# Patient Record
Sex: Male | Born: 1955
Health system: Southern US, Community
[De-identification: ages and names within clinical notes are randomized; demographics above are authoritative.]

## PROBLEM LIST (undated history)

## (undated) DIAGNOSIS — I10 Essential (primary) hypertension: Secondary | ICD-10-CM

## (undated) DIAGNOSIS — I213 ST elevation (STEMI) myocardial infarction of unspecified site: Secondary | ICD-10-CM

## (undated) DIAGNOSIS — K449 Diaphragmatic hernia without obstruction or gangrene: Secondary | ICD-10-CM

## (undated) DIAGNOSIS — I2699 Other pulmonary embolism without acute cor pulmonale: Secondary | ICD-10-CM

## (undated) DIAGNOSIS — I639 Cerebral infarction, unspecified: Secondary | ICD-10-CM

## (undated) DIAGNOSIS — R570 Cardiogenic shock: Secondary | ICD-10-CM

## (undated) DIAGNOSIS — J9691 Respiratory failure, unspecified with hypoxia: Secondary | ICD-10-CM

## (undated) DIAGNOSIS — I2510 Atherosclerotic heart disease of native coronary artery without angina pectoris: Secondary | ICD-10-CM

## (undated) DIAGNOSIS — E785 Hyperlipidemia, unspecified: Secondary | ICD-10-CM

## (undated) DIAGNOSIS — Z951 Presence of aortocoronary bypass graft: Secondary | ICD-10-CM

## (undated) DIAGNOSIS — G479 Sleep disorder, unspecified: Secondary | ICD-10-CM

## (undated) DIAGNOSIS — R7303 Prediabetes: Secondary | ICD-10-CM

## (undated) DIAGNOSIS — R7401 Elevation of levels of liver transaminase levels: Secondary | ICD-10-CM

## (undated) DIAGNOSIS — G459 Transient cerebral ischemic attack, unspecified: Secondary | ICD-10-CM

## (undated) HISTORY — DX: Presence of aortocoronary bypass graft: Z95.1

## (undated) HISTORY — PX: CARDIAC CATHETERIZATION: SHX172

## (undated) HISTORY — DX: Hyperlipidemia, unspecified: E78.5

---

## 2011-01-06 ENCOUNTER — Inpatient Hospital Stay (HOSPITAL_COMMUNITY)
Admission: EM | Admit: 2011-01-06 | Discharge: 2011-01-09 | DRG: 014 | Disposition: A | Discharge status: Home / Self Care | Payer: BC Managed Care – PPO | Attending: Internal Medicine | Admitting: Internal Medicine

## 2011-01-06 ENCOUNTER — Emergency Department (HOSPITAL_COMMUNITY): Payer: BC Managed Care – PPO

## 2011-01-06 DIAGNOSIS — R7309 Other abnormal glucose: Secondary | ICD-10-CM | POA: Diagnosis present

## 2011-01-06 DIAGNOSIS — Z7982 Long term (current) use of aspirin: Secondary | ICD-10-CM

## 2011-01-06 DIAGNOSIS — I6350 Cerebral infarction due to unspecified occlusion or stenosis of unspecified cerebral artery: Principal | ICD-10-CM | POA: Diagnosis present

## 2011-01-06 DIAGNOSIS — R259 Unspecified abnormal involuntary movements: Secondary | ICD-10-CM | POA: Diagnosis present

## 2011-01-06 DIAGNOSIS — I10 Essential (primary) hypertension: Secondary | ICD-10-CM | POA: Diagnosis present

## 2011-01-06 DIAGNOSIS — E785 Hyperlipidemia, unspecified: Secondary | ICD-10-CM | POA: Diagnosis present

## 2011-01-06 DIAGNOSIS — D72829 Elevated white blood cell count, unspecified: Secondary | ICD-10-CM | POA: Diagnosis present

## 2011-01-06 LAB — RAPID URINE DRUG SCREEN, HOSP PERFORMED
Amphetamines: NOT DETECTED
Barbiturates: NOT DETECTED
Benzodiazepines: NOT DETECTED
Cocaine: NOT DETECTED
Tetrahydrocannabinol: NOT DETECTED

## 2011-01-06 LAB — TROPONIN I: Troponin I: <0.3 ng/mL (ref <0.30)

## 2011-01-06 LAB — COMPREHENSIVE METABOLIC PANEL
ALT: 25 U/L (ref 0–53)
AST: 23 U/L (ref 0–37)
Albumin: 3.8 g/dL (ref 3.5–5.2)
CO2: 23 mEq/L (ref 19–32)
Calcium: 8.7 mg/dL (ref 8.4–10.5)
Creatinine, Ser: 1.04 mg/dL (ref 0.50–1.35)
GFR calc non Af Amer: >60 mL/min (ref >60)
Sodium: 135 mEq/L (ref 135–145)
Total Protein: 7.7 g/dL (ref 6.0–8.3)

## 2011-01-06 LAB — CBC
Hemoglobin: 16.1 g/dL (ref 13.0–17.0)
MCHC: 36 g/dL (ref 30.0–36.0)
Platelets: 331 10*3/uL (ref 150–400)
RBC: 5.02 MIL/uL (ref 4.22–5.81)

## 2011-01-06 LAB — APTT: aPTT: 31 seconds (ref 24–37)

## 2011-01-06 LAB — URINALYSIS, ROUTINE W REFLEX MICROSCOPIC
Glucose, UA: NEGATIVE mg/dL
Leukocytes, UA: NEGATIVE
Nitrite: NEGATIVE
Protein, ur: 30 mg/dL — AB
pH: 6 (ref 5.0–8.0)

## 2011-01-06 LAB — URINE MICROSCOPIC-ADD ON

## 2011-01-06 LAB — PROTIME-INR: INR: 0.95 (ref 0.00–1.49)

## 2011-01-06 LAB — CK TOTAL AND CKMB (NOT AT ARMC): Relative Index: 1.3 (ref 0.0–2.5)

## 2011-01-07 ENCOUNTER — Inpatient Hospital Stay (HOSPITAL_COMMUNITY): Payer: BC Managed Care – PPO

## 2011-01-07 LAB — BASIC METABOLIC PANEL
CO2: 22 mEq/L (ref 19–32)
Chloride: 104 mEq/L (ref 96–112)
GFR calc Af Amer: >60 mL/min (ref >60)
Potassium: 3.7 mEq/L (ref 3.5–5.1)

## 2011-01-07 LAB — PHOSPHORUS: Phosphorus: 2 mg/dL — ABNORMAL LOW (ref 2.3–4.6)

## 2011-01-07 LAB — GLUCOSE, CAPILLARY

## 2011-01-07 LAB — CBC
HCT: 44.3 % (ref 39.0–52.0)
MCHC: 35.7 g/dL (ref 30.0–36.0)
Platelets: 365 10*3/uL (ref 150–400)
RDW: 12.6 % (ref 11.5–15.5)
WBC: 14.6 10*3/uL — ABNORMAL HIGH (ref 4.0–10.5)

## 2011-01-07 LAB — LIPID PANEL
LDL Cholesterol: UNDETERMINED mg/dL (ref 0–99)
Triglycerides: 957 mg/dL — ABNORMAL HIGH (ref <150)

## 2011-01-07 LAB — COMPREHENSIVE METABOLIC PANEL
ALT: 23 U/L (ref 0–53)
AST: 20 U/L (ref 0–37)
CO2: 20 mEq/L (ref 19–32)
Calcium: 9 mg/dL (ref 8.4–10.5)
Chloride: 105 mEq/L (ref 96–112)
Creatinine, Ser: 0.86 mg/dL (ref 0.50–1.35)
GFR calc Af Amer: >60 mL/min (ref >60)
GFR calc non Af Amer: >60 mL/min (ref >60)
Glucose, Bld: 125 mg/dL — ABNORMAL HIGH (ref 70–99)
Total Bilirubin: 0.5 mg/dL (ref 0.3–1.2)

## 2011-01-07 LAB — HEMOGLOBIN A1C
Hgb A1c MFr Bld: 5.8 % — ABNORMAL HIGH (ref <5.7)
Mean Plasma Glucose: 120 mg/dL — ABNORMAL HIGH (ref <117)

## 2011-01-07 LAB — MAGNESIUM: Magnesium: 2.2 mg/dL (ref 1.5–2.5)

## 2011-01-07 LAB — MRSA PCR SCREENING: MRSA by PCR: NEGATIVE

## 2011-01-07 LAB — CARDIAC PANEL(CRET KIN+CKTOT+MB+TROPI)
CK, MB: 2.2 ng/mL (ref 0.3–4.0)
CK, MB: 3.8 ng/mL (ref 0.3–4.0)
Troponin I: <0.3 ng/mL (ref <0.30)

## 2011-01-07 NOTE — H&P (Signed)
[name], [name] NO.:  [id number]  MEDICAL RECORD NO.:  [id number]  LOCATION:  [facility]                         FACILITY:  [place]  PHYSICIAN:  [name], MDDATE OF BIRTH:  1956-03-21  DATE OF ADMISSION:  01/06/2011 DATE OF DISCHARGE:                             HISTORY & PHYSICAL   PRIMARY CARE PHYSICIAN:  Dr. Axel [name] at [place].  CHIEF COMPLAINT:  Weakness of the left side.  HISTORY OF PRESENT ILLNESS:  A 55 year old male who was diagnosed with hypertension, and was given some antihypertensive by his primary care physician year ago.  The patient stated it is probably Diovan which he took a few days.  After that, he did not feel good, so he stopped it. He said he was feeling weak after taking that medicine but did not have any angioedema or allergic reaction.  He has been feeling weak on the left side today morning at around 8 o'clock which lasted for around 10 minutes.  He thought he had to drag his leg when he walked.  He also felt clumsiness in his hand on the left side, the right was normal.  He did not have any headaches or any visual symptoms, did not have any difficulty speaking or swallowing.  He did not lose consciousness, did not have any chest pain, shortness of breath, nausea, vomiting, abdominal pain, dysuria, discharge, or diarrhea.  The symptoms resolved by itself within 10 minutes and when he went to church today evening at around 8 o'clock again, the symptoms recurred wherein he had walked back to his truck and sat down there and the symptoms resolved by 10 minutes. The same thing happened again, he felt weak on the left side and had clumsiness in his left and had to drag his feet on the left side.  He did not lose complete function of his upper or lower extremities in the left side.  The right upper and lower extremities are completely normal. He did not have any headache, visual symptoms, difficulty speaking, or swallowing.   Eventually, the patient was brought to the ER, had a CT of head which was negative.  Blood pressure was found to be high around 270/147 at this time it is improved with some hydralazine IV.  The patient will be admitted for further workup.  The patient at this time denies any chest pain, shortness of breath, nausea, vomiting, abdominal pain, dysuria, discharge, or diarrhea.  PAST MEDICAL HISTORY:  History of hypertension.  PAST SURGICAL HISTORY:  Appendectomy and has had a left middle finger surgery which at this time is little bit flexed in position.  MEDICATIONS PRIOR TO ADMISSION:  None.  SOCIAL HISTORY:  The patient denies smoking cigarettes, drinking alcohol, or using illegal drugs.  He is married, lives with his wife.  FAMILY HISTORY:  Positive for hypertension and coronary artery disease.  REVIEW OF SYSTEMS:  As per history of present illness, nothing else significant.  PHYSICAL EXAMINATION:  GENERAL:  The patient examined at bedside not in acute distress. VITAL SIGNS:  Blood pressure is presently 190/115, pulse 90 per minute, temperature 97.7, respiration 88, and O2 sat is 100%. HEENT:  Anicteric.  No pallor.  No facial asymmetry.  PLA positive.  No neck rigidity.  No discharge from ears, eyes, nose, or mouth.  Tongue is midline. CHEST:  Bilateral air entry present.  No rhonchi.  No crepitation. HEART:  S1 and S2 heard. ABDOMEN:  Soft and nontender.  Bowel sounds heard. CNS:  The patient is alert, awake, oriented to time place and person. Moves upper and lower extremities 5/5.  There is no pronator drift. There is no dysdiadochokinesia.  When I made the patient to walk, he feels a little bit dizzy.  He feels as if his leg is pulling. EXTREMITIES:  Peripheral pulses felt.  No edema.  LABORATORY DATA:  EKG normal sinus rhythm with heart rate around 86 beats per minute with nonspecific ST-T changes.  CT of the head without contrast shows no acute intracranial  abnormality, atrophy, chronic microvascular disease.  Chest x-ray shows no evidence of active pulmonary disease.  CBC:  WBC 11, hemoglobin is 16.1, hematocrit is 44.7, and platelets 331.  PT/INR is 12.9 and 0.9.  Complete metabolic panel sodium [id number], potassium 3.7, chloride 100, carbon dioxide 23, glucose 107, BUN 19, creatinine 1, total bilirubin is 0.4, alkaline phosphatase 75, AST 23, ALT 25, total protein 10.7, albumin 3.8, calcium 8.7, creatine kinase is 202, CK-MB is 2.6, relative index 1.3, troponin less than 0.3.  UA is negative for nitrites and leukocytes, protein positive, glucose negative, ketones negative, urine drug screen negative.  ASSESSMENT: 1. Possible cerebrovascular accident  versus transient ischemic     attack. 2. Accelerated hypertension.  PLAN: 1. At this time, admit the patient to telemetry. 2. For his possible CVA versus TIA, at this time we will place the     patient on neuro checks, get MRI of the brain, MRA of the brain,     carotid Doppler, and 2-D echo.  The patient be placed on aspirin     once he passes swallow.  I have also consulted Neurology, Dr.     Wallie [name], who is going to see the patient.  We will follow     their recommendations. 3. Accelerated hypertension.  At this time, we will start the patient     on Norvasc 5 mg p.o. daily and p.r.n. labetalol 10 mg q.4 hourly     for systolic blood pressure more than 200/110.  Eventually, in next     24 hours, if the blood pressure is still very high, we will need to     add other antihypertensives. 4. We will check fasting lipid panel, hemoglobin A1c, and further     recommendation based on test order and clinic course and consults     recommendations.     [name] Patience, MD     ANK/MEDQ  D:  01/06/2011  T:  01/07/2011  Job:  [id number]  Electronically Signed by [name] Birchwood MD on 01/07/2011 07:49:41 AM

## 2011-01-08 LAB — BASIC METABOLIC PANEL
CO2: 26 mEq/L (ref 19–32)
Glucose, Bld: 103 mg/dL — ABNORMAL HIGH (ref 70–99)
Potassium: 3.7 mEq/L (ref 3.5–5.1)
Sodium: 139 mEq/L (ref 135–145)

## 2011-01-08 LAB — CBC
Hemoglobin: 16.2 g/dL (ref 13.0–17.0)
MCH: 31.8 pg (ref 26.0–34.0)
RBC: 5.09 MIL/uL (ref 4.22–5.81)

## 2011-01-09 LAB — BASIC METABOLIC PANEL
BUN: 19 mg/dL (ref 6–23)
Chloride: 101 mEq/L (ref 96–112)
Glucose, Bld: 92 mg/dL (ref 70–99)
Potassium: 3.7 mEq/L (ref 3.5–5.1)

## 2011-01-09 LAB — CBC
HCT: 44.5 % (ref 39.0–52.0)
Hemoglobin: 15.3 g/dL (ref 13.0–17.0)
MCH: 31.4 pg (ref 26.0–34.0)
MCHC: 34.4 g/dL (ref 30.0–36.0)

## 2011-01-13 LAB — CULTURE, BLOOD (ROUTINE X 2)
Culture  Setup Time: 201206251427
Culture: NO GROWTH

## 2011-01-27 NOTE — Discharge Summary (Signed)
NAMEJACOREY, SPACE              ACCOUNT NO.:  [id number]  MEDICAL RECORD NO.:  [id number]  LOCATION:                                 FACILITY:  PHYSICIAN:  Edythe Lynn, M.D.       DATE OF BIRTH:  [birth year]  DATE OF ADMISSION:  01/06/2011 DATE OF DISCHARGE:  01/09/2011                              DISCHARGE SUMMARY   PRIMARY CARE PHYSICIAN:  Tamsen Roers, MD, - in [place]: 1. Acute small focus of infarction in the posterior limb of the     internal capsule on the right - felt to be small vessel disease. 2. Hypertension. 3. Hyperlipidemia with a total cholesterol level of 313 and     triglyceride level of 957. 4. Impaired glucose tolerance.  DISCHARGE MEDICATIONS: 1. Amlodipine 10 mg by mouth daily. 2. Gemfibrozil 600 mg by mouth twice a day. 3. Aspirin 325 mg daily. 4. Triamterene/hydrochlorothiazide 50/25 one tablet daily. 5. A study medication from the [facility].  CONDITION ON DISCHARGE:  [name] was discharge in good condition, alert, oriented, and neurologically intact.  He will follow up with Dr. Chase Picket in [facility] for cholesterol checks, blood pressure checks, medication adjustments.  He will also follow up with [facility] for adjustment of his study medication.  HISTORY AND PHYSICAL:  Refer the dictated H and P which was done by Dr. Hal Hope .  CONSULTATION THIS ADMISSION:  The patient was seen in consultation by the [facility] and Dr. Wallie Char from [facility].  PROCEDURES DURING THIS ADMISSION: 1. On January 08, 2011, the patient underwent an MRI of the brain which     showed an acute small focus of infarction in the posterior limb of     the internal capsule on the right.  The patient had MRA of the head     which was mild atherosclerotic disease but no significant stenosis. 2. On January 07, 2011, echocardiogram, findings of ejection fraction  of     55-60%, no regional wall motion abnormalities, essentially normal     study. 3. Carotid ultrasound on January 07, 2011, showing no significant     extracranial carotid artery stenosis demonstrated. 4. Transcranial ultrasound which shows low normal mean flow velocities     in the majority of the vessels.  No significant intracranial     stenosis or shunt seen.  HOSPITAL COURSE:  [name] is a 55 year old gentleman with history of hypertension presented to emergency room with severe headache, blurred vision, blood pressure readings of 200/110.  He was also having some left arm weakness and left leg weakness which resolved in course of 10 minutes.  [name] was admitted to a step-down unit where he was started on labetalol intravenously and then he was transitioned to amlodipine and hydrochlorothiazide.  His blood pressure readings improved.  By the time of discharge, he was into the 160s over 90s.  He was closely monitored and he had no neurological deficits by the time of discharge.  He was seen twice by the consultant neurologist, Dr. Leonie Man, who recommended that he take aspirin and a study medication to prevent recurrent strokes.  The patient was instructed about low-salt, heart- healthy diet.  He was started on three antihypertensives with triamterene and hydrochlorothiazide and amlodipine and he had a fasting lipid panel checked which revealed that he has mixed hyperlipidemia with a high total cholesterol, high triglycerides, low HDL.  He was advised on a low-fat diet, exercise, and was started on gemfibrozil as his triglycerides were very elevated.  The patient will follow up with his primary care physician for blood pressure checks, fasting lipid panel checks, and with [facility] for antiplatelet therapy checkups from the research study he is enrolled in.     Edythe Lynn, M.D.     SL/MEDQ  D:  01/11/2011  T:  01/11/2011  Job:  [id number]  cc:    Jeanella Craze. Little, M.D. Pramod P. Leonie Man, MD  Electronically Signed by Edythe Lynn M.D. on 01/27/2011 07:39:15 AM

## 2012-08-31 ENCOUNTER — Ambulatory Visit (HOSPITAL_COMMUNITY): Admit: 2012-08-31 | Payer: Self-pay | Admitting: Cardiology

## 2012-08-31 ENCOUNTER — Encounter (HOSPITAL_COMMUNITY): Payer: Self-pay | Admitting: Emergency Medicine

## 2012-08-31 ENCOUNTER — Inpatient Hospital Stay (HOSPITAL_COMMUNITY)
Admission: EM | Admit: 2012-08-31 | Discharge: 2012-09-05 | DRG: 106 | Disposition: A | Discharge status: Home / Self Care | Payer: BC Managed Care – PPO | Attending: Surgery | Admitting: Surgery

## 2012-08-31 ENCOUNTER — Encounter (HOSPITAL_COMMUNITY)
Admission: EM | Disposition: A | Discharge status: Home / Self Care | Payer: Self-pay | Source: Home / Self Care | Attending: Surgery

## 2012-08-31 ENCOUNTER — Emergency Department (HOSPITAL_COMMUNITY): Payer: BC Managed Care – PPO

## 2012-08-31 DIAGNOSIS — I219 Acute myocardial infarction, unspecified: Secondary | ICD-10-CM

## 2012-08-31 DIAGNOSIS — I2510 Atherosclerotic heart disease of native coronary artery without angina pectoris: Secondary | ICD-10-CM | POA: Diagnosis present

## 2012-08-31 DIAGNOSIS — R112 Nausea with vomiting, unspecified: Secondary | ICD-10-CM | POA: Diagnosis not present

## 2012-08-31 DIAGNOSIS — K449 Diaphragmatic hernia without obstruction or gangrene: Secondary | ICD-10-CM | POA: Diagnosis present

## 2012-08-31 DIAGNOSIS — Z79899 Other long term (current) drug therapy: Secondary | ICD-10-CM

## 2012-08-31 DIAGNOSIS — D62 Acute posthemorrhagic anemia: Secondary | ICD-10-CM | POA: Diagnosis not present

## 2012-08-31 DIAGNOSIS — Z23 Encounter for immunization: Secondary | ICD-10-CM

## 2012-08-31 DIAGNOSIS — I2129 ST elevation (STEMI) myocardial infarction involving other sites: Principal | ICD-10-CM | POA: Diagnosis present

## 2012-08-31 DIAGNOSIS — R7309 Other abnormal glucose: Secondary | ICD-10-CM | POA: Diagnosis present

## 2012-08-31 DIAGNOSIS — I472 Ventricular tachycardia, unspecified: Secondary | ICD-10-CM | POA: Diagnosis not present

## 2012-08-31 DIAGNOSIS — E8779 Other fluid overload: Secondary | ICD-10-CM | POA: Diagnosis not present

## 2012-08-31 DIAGNOSIS — Z951 Presence of aortocoronary bypass graft: Secondary | ICD-10-CM

## 2012-08-31 DIAGNOSIS — I213 ST elevation (STEMI) myocardial infarction of unspecified site: Secondary | ICD-10-CM

## 2012-08-31 DIAGNOSIS — R7303 Prediabetes: Secondary | ICD-10-CM | POA: Diagnosis present

## 2012-08-31 DIAGNOSIS — E782 Mixed hyperlipidemia: Secondary | ICD-10-CM | POA: Diagnosis present

## 2012-08-31 DIAGNOSIS — I4891 Unspecified atrial fibrillation: Secondary | ICD-10-CM | POA: Diagnosis present

## 2012-08-31 DIAGNOSIS — I4729 Other ventricular tachycardia: Secondary | ICD-10-CM | POA: Diagnosis not present

## 2012-08-31 DIAGNOSIS — Z8673 Personal history of transient ischemic attack (TIA), and cerebral infarction without residual deficits: Secondary | ICD-10-CM

## 2012-08-31 DIAGNOSIS — Z8249 Family history of ischemic heart disease and other diseases of the circulatory system: Secondary | ICD-10-CM

## 2012-08-31 DIAGNOSIS — I10 Essential (primary) hypertension: Secondary | ICD-10-CM | POA: Diagnosis present

## 2012-08-31 DIAGNOSIS — R079 Chest pain, unspecified: Secondary | ICD-10-CM

## 2012-08-31 HISTORY — DX: Cerebral infarction, unspecified: I63.9

## 2012-08-31 HISTORY — DX: ST elevation (STEMI) myocardial infarction of unspecified site: I21.3

## 2012-08-31 HISTORY — DX: Transient cerebral ischemic attack, unspecified: G45.9

## 2012-08-31 HISTORY — PX: PERCUTANEOUS CORONARY STENT INTERVENTION (PCI-S): SHX5485

## 2012-08-31 HISTORY — DX: Diaphragmatic hernia without obstruction or gangrene: K44.9

## 2012-08-31 HISTORY — DX: Essential (primary) hypertension: I10

## 2012-08-31 HISTORY — DX: Prediabetes: R73.03

## 2012-08-31 HISTORY — PX: LEFT HEART CATHETERIZATION WITH CORONARY ANGIOGRAM: SHX5451

## 2012-08-31 LAB — COMPREHENSIVE METABOLIC PANEL
Alkaline Phosphatase: 65 U/L (ref 39–117)
BUN: 25 mg/dL — ABNORMAL HIGH (ref 6–23)
Chloride: 103 mEq/L (ref 96–112)
GFR calc Af Amer: 63 mL/min — ABNORMAL LOW (ref >90)
Glucose, Bld: 129 mg/dL — ABNORMAL HIGH (ref 70–99)
Potassium: 3.6 mEq/L (ref 3.5–5.1)
Total Bilirubin: 0.3 mg/dL (ref 0.3–1.2)

## 2012-08-31 LAB — CBC
HCT: 41.6 % (ref 39.0–52.0)
Hemoglobin: 14.6 g/dL (ref 13.0–17.0)
MCH: 29.8 pg (ref 26.0–34.0)
MCHC: 35.1 g/dL (ref 30.0–36.0)
MCV: 87.9 fL (ref 78.0–100.0)
Platelets: 408 10*3/uL — ABNORMAL HIGH (ref 150–400)
RBC: 4.63 MIL/uL (ref 4.22–5.81)
RDW: 12.2 % (ref 11.5–15.5)

## 2012-08-31 LAB — PROTIME-INR: Prothrombin Time: 13 seconds (ref 11.6–15.2)

## 2012-08-31 LAB — APTT: aPTT: 28 seconds (ref 24–37)

## 2012-08-31 LAB — POCT I-STAT, CHEM 8
BUN: 23 mg/dL (ref 6–23)
BUN: 24 mg/dL — ABNORMAL HIGH (ref 6–23)
Chloride: 106 mEq/L (ref 96–112)
Creatinine, Ser: 1.5 mg/dL — ABNORMAL HIGH (ref 0.50–1.35)
Creatinine, Ser: 1.4 mg/dL — ABNORMAL HIGH (ref 0.50–1.35)
Potassium: 3.6 mEq/L (ref 3.5–5.1)
Sodium: 141 mEq/L (ref 135–145)
Sodium: 141 mEq/L (ref 135–145)
TCO2: 24 mmol/L (ref 0–100)
TCO2: 25 mmol/L (ref 0–100)

## 2012-08-31 LAB — POCT I-STAT TROPONIN I

## 2012-08-31 SURGERY — LEFT HEART CATHETERIZATION WITH CORONARY ANGIOGRAM
Anesthesia: LOCAL

## 2012-08-31 MED ORDER — PANTOPRAZOLE SODIUM 40 MG PO TBEC: 40.0000 mg | DELAYED_RELEASE_TABLET | Freq: Every day | ORAL | Status: DC

## 2012-08-31 MED ORDER — ONDANSETRON HCL 4 MG/2ML IJ SOLN
INTRAMUSCULAR | Status: AC
  Filled 2012-08-31: qty 2

## 2012-08-31 MED ORDER — ONDANSETRON HCL 4 MG/2ML IJ SOLN
4.0000 mg | Freq: Once | INTRAMUSCULAR | Status: AC
  Administered 2012-08-31: 4 mg via INTRAVENOUS
  Filled 2012-08-31: qty 2

## 2012-08-31 MED ORDER — SODIUM CHLORIDE 0.9 % IV SOLN
1000.0000 mL | INTRAVENOUS | Status: DC
  Administered 2012-08-31: 1000 mL via INTRAVENOUS

## 2012-08-31 MED ORDER — EPTIFIBATIDE 75 MG/100ML IV SOLN
2.0000 ug/kg/min | INTRAVENOUS | Status: DC
  Administered 2012-09-01: 2 ug/kg/min via INTRAVENOUS
  Filled 2012-08-31: qty 100

## 2012-08-31 MED ORDER — LIDOCAINE HCL (PF) 1 % IJ SOLN
INTRAMUSCULAR | Status: AC
  Filled 2012-08-31: qty 30

## 2012-08-31 MED ORDER — NITROGLYCERIN 1 MG/10 ML FOR IR/CATH LAB
INTRA_ARTERIAL | Status: AC
  Filled 2012-08-31: qty 10

## 2012-08-31 MED ORDER — SODIUM CHLORIDE 0.9 % IJ SOLN
3.0000 mL | Freq: Two times a day (BID) | INTRAMUSCULAR | Status: DC
  Administered 2012-09-01: 3 mL via INTRAVENOUS

## 2012-08-31 MED ORDER — SODIUM CHLORIDE 0.9 % IJ SOLN: 3.0000 mL | INTRAMUSCULAR | Status: DC | PRN

## 2012-08-31 MED ORDER — NITROGLYCERIN IN D5W 200-5 MCG/ML-% IV SOLN: 5.0000 ug/min | INTRAVENOUS | Status: DC

## 2012-08-31 MED ORDER — ACETAMINOPHEN 325 MG PO TABS: 650.0000 mg | ORAL_TABLET | ORAL | Status: DC | PRN

## 2012-08-31 MED ORDER — SODIUM CHLORIDE 0.9 % IV SOLN: 250.0000 mL | INTRAVENOUS | Status: DC | PRN

## 2012-08-31 MED ORDER — METOPROLOL TARTRATE 1 MG/ML IV SOLN
5.0000 mg | Freq: Once | INTRAVENOUS | Status: AC
  Administered 2012-08-31 (×2): 5 mg via INTRAVENOUS

## 2012-08-31 MED ORDER — SODIUM CHLORIDE 0.9 % IV SOLN
1.7500 mg/kg/h | INTRAVENOUS | Status: DC
  Filled 2012-08-31: qty 250

## 2012-08-31 MED ORDER — ONDANSETRON HCL 4 MG/2ML IJ SOLN
4.0000 mg | Freq: Four times a day (QID) | INTRAMUSCULAR | Status: DC | PRN
  Administered 2012-09-01: 4 mg via INTRAVENOUS
  Filled 2012-08-31: qty 2

## 2012-08-31 MED ORDER — ONDANSETRON HCL 4 MG/2ML IJ SOLN
4.0000 mg | Freq: Once | INTRAMUSCULAR | Status: AC
  Administered 2012-08-31: 4 mg via INTRAVENOUS

## 2012-08-31 MED ORDER — HYDROMORPHONE HCL PF 1 MG/ML IJ SOLN
1.0000 mg | INTRAMUSCULAR | Status: DC | PRN
  Administered 2012-08-31 (×2): 1 mg via INTRAVENOUS
  Filled 2012-08-31 (×2): qty 1

## 2012-08-31 MED ORDER — BIVALIRUDIN 250 MG IV SOLR
INTRAVENOUS | Status: AC
  Filled 2012-08-31: qty 250

## 2012-08-31 MED ORDER — FUROSEMIDE 10 MG/ML IJ SOLN
40.0000 mg | Freq: Once | INTRAMUSCULAR | Status: AC
  Administered 2012-08-31: 40 mg via INTRAVENOUS
  Filled 2012-08-31: qty 4

## 2012-08-31 MED ORDER — METOPROLOL TARTRATE 1 MG/ML IV SOLN
INTRAVENOUS | Status: AC
  Administered 2012-08-31: 5 mg via INTRAVENOUS
  Filled 2012-08-31: qty 5

## 2012-08-31 MED ORDER — EPTIFIBATIDE 75 MG/100ML IV SOLN
INTRAVENOUS | Status: AC
  Administered 2012-09-01: 2 ug/kg/min via INTRAVENOUS
  Filled 2012-08-31: qty 100

## 2012-08-31 MED ORDER — HYDROMORPHONE HCL PF 2 MG/ML IJ SOLN
2.0000 mg | Freq: Once | INTRAMUSCULAR | Status: AC
  Administered 2012-08-31: 2 mg via INTRAVENOUS

## 2012-08-31 MED ORDER — NITROGLYCERIN 0.4 MG SL SUBL
0.4000 mg | SUBLINGUAL_TABLET | SUBLINGUAL | Status: DC | PRN
  Administered 2012-08-31: 0.4 mg via SUBLINGUAL

## 2012-08-31 MED ORDER — NITROGLYCERIN 0.4 MG SL SUBL
SUBLINGUAL_TABLET | SUBLINGUAL | Status: AC
  Filled 2012-08-31: qty 25

## 2012-08-31 MED ORDER — HEPARIN SODIUM (PORCINE) 5000 UNIT/ML IJ SOLN
INTRAMUSCULAR | Status: AC
  Filled 2012-08-31: qty 1

## 2012-08-31 MED ORDER — SODIUM CHLORIDE 0.9 % IV SOLN: INTRAVENOUS | Status: DC

## 2012-08-31 MED ORDER — PANTOPRAZOLE SODIUM 40 MG IV SOLR
40.0000 mg | Freq: Once | INTRAVENOUS | Status: DC
  Filled 2012-08-31: qty 40

## 2012-08-31 MED ORDER — HEPARIN (PORCINE) IN NACL 2-0.9 UNIT/ML-% IJ SOLN
INTRAMUSCULAR | Status: AC
  Filled 2012-08-31: qty 1000

## 2012-08-31 MED ORDER — HYDROMORPHONE HCL PF 2 MG/ML IJ SOLN
INTRAMUSCULAR | Status: AC
  Filled 2012-08-31: qty 1

## 2012-08-31 MED ORDER — VERAPAMIL HCL 2.5 MG/ML IV SOLN
INTRAVENOUS | Status: AC
  Filled 2012-08-31: qty 2

## 2012-08-31 MED ORDER — HEPARIN (PORCINE) IN NACL 100-0.45 UNIT/ML-% IJ SOLN
1100.0000 [IU]/h | INTRAMUSCULAR | Status: DC
  Administered 2012-09-01: 1100 [IU]/h via INTRAVENOUS
  Filled 2012-08-31: qty 250

## 2012-08-31 MED ORDER — NITROGLYCERIN IN D5W 200-5 MCG/ML-% IV SOLN
INTRAVENOUS | Status: AC
  Filled 2012-08-31: qty 250

## 2012-08-31 MED ORDER — METOPROLOL TARTRATE 1 MG/ML IV SOLN
INTRAVENOUS | Status: AC
  Filled 2012-08-31: qty 5

## 2012-08-31 NOTE — ED Provider Notes (Signed)
History    CSN: [id number] Arrival date & time 08/31/12  [id number] First MD Initiated Contact with Patient 08/31/12 1918     Chief Complaint  Patient presents with  . Chest Pain   Patient is a 57 y.o. male presenting with chest pain.  Chest Pain  Patient presents to the emergency room with complaints of acute chest pain. Patient has been having trouble with his thoracic back region and had not been working for the last 3 weeks. He started lifting a mattress today when he started complaining of acute severe 10 of 10 sharp pain in his chest radiating to his back. EMS administered nitroglycerin as well as aspirin without relief. Patient states the pain increases with movement and also is better when his wife has her fist in his back.  He cannot get comfortable. He denies numbness weakness or shortness of breath. He denies any nausea vomiting.  No history of coronary artery disease. No history of aortic disease. Past Medical History  Diagnosis Date  . Hypertension   . Borderline diabetic   . Mini stroke     2  . Hiatal hernia     History reviewed. No pertinent past surgical history.  No family history on file.  History  Substance Use Topics  . Smoking status: Never Smoker   . Smokeless tobacco: Not on file  . Alcohol Use: No      Review of Systems  Cardiovascular: Positive for chest pain.  All other systems reviewed and are negative.    Allergies  Review of patient's allergies indicates no known allergies.  Home Medications   Current Outpatient Rx  Name  Route  Sig  Dispense  Refill  . amLODipine (NORVASC) 10 MG tablet   Oral   Take 10 mg by mouth daily.         Marland Kitchen gemfibrozil (LOPID) 600 MG tablet   Oral   Take 600 mg by mouth 2 (two) times daily before a meal.           BP 135/88  Temp(Src) 97.9 F (36.6 C) (Oral)  Resp 14  SpO2 100%  Physical Exam  Nursing note and vitals reviewed. Constitutional: He appears distressed.  HENT:  Head: Normocephalic and  atraumatic.  Right Ear: External ear normal.  Left Ear: External ear normal.  Eyes: Conjunctivae are normal. Right eye exhibits no discharge. Left eye exhibits no discharge. No scleral icterus.  Neck: Neck supple. No tracheal deviation present.  Cardiovascular: Normal rate, regular rhythm and intact distal pulses.   Pulmonary/Chest: Effort normal and breath sounds normal. No stridor. No respiratory distress. He has no wheezes. He has no rales.  Abdominal: Soft. Bowel sounds are normal. He exhibits no distension. There is no tenderness. There is no rebound and no guarding.  Musculoskeletal: He exhibits no edema and no tenderness.       Thoracic back: He exhibits decreased range of motion and tenderness. He exhibits no bony tenderness and no swelling.       Back:  Neurological: He is alert. He has normal strength. No sensory deficit. Cranial nerve deficit:  no gross defecits noted. He exhibits normal muscle tone. He displays no seizure activity. Coordination normal.  Skin: Skin is warm and dry. No rash noted. He is not diaphoretic.  Psychiatric: He has a normal mood and affect.    ED Course  Procedures (including critical care time) EKG Sinus rhythm rate 64 Nonspecific intraventricular conduction delay ST depression anterior leads No prior EKG for  comparison  Labs Reviewed  CBC - Abnormal; Notable for the following:    WBC 14.3 (*)    Platelets 408 (*)    All other components within normal limits  POCT I-STAT, CHEM 8 - Abnormal; Notable for the following:    Creatinine, Ser 1.50 (*)    Glucose, Bld 132 (*)    Calcium, Ion 1.09 (*)    All other components within normal limits  PROTIME-INR  APTT  POCT I-STAT TROPONIN I   No results found.   No diagnosis found.    MDM  Pt's pain is atypical for cardiac disease.  EKG shows t wave changes anteriorly however, ?Wellen's t waves.  With his acute pain will CT his chest to rule out dissection.  Symptoms are more suggestive of  muscular etiology      Kathalene Frames, MD 08/31/12 2015

## 2012-08-31 NOTE — ED Notes (Signed)
Family at bedside. 

## 2012-08-31 NOTE — ED Provider Notes (Signed)
Medical screening examination/treatment/procedure(s) were performed by non-physician practitioner and as supervising physician I was immediately available for consultation/collaboration.   Blanchie Dessert, MD 08/31/12 2119

## 2012-08-31 NOTE — H&P (Signed)
[place]          Reason for admission: Acute myocardial infarction, ST segment elevation, posterior   HPI: This is a 57 y.o. male with a past medical history significant for hypertension, severe mixed hyperlipidemia, borderline diabetes mellitus, previous ischemic stroke of the right internal capsule presents with severe intrascapular pain primarily located in the intrascapular area but also in the lower retrosternal area that seemed to have some musculoskeletal features it has persisted and is now associated with nausea and mild dyspnea. His initial electrocardiogram shows ST segment depression T wave inversion limited to leads V1 - V3, but repeat ECG shows worsening ST segment depression in the anterior precordial leads as well as ST segment elevation in lead V6 that is at least 2 mm in maximum amplitude, consistent with an acute ST segment elevation myocardial infarction. Since his arrival to the emergency room he has not had any arrhythmia, hypotension or manifestations of congestive heart failure.   PMHx:  Past Medical History  Diagnosis Date  . Hypertension   . Borderline diabetic   . Mini stroke     2  . Hiatal hernia    History reviewed. No pertinent past surgical history.  FAMHx: No family history on file.  SOCHx:  reports that he has never smoked. He does not have any smokeless tobacco history on file. He reports that he does not drink alcohol or use illicit drugs.  ALLERGIES: No Known Allergies  ROS: Constitutional: positive for malaise, negative for chills, fevers and night sweats Eyes: negative Ears, nose, mouth, throat, and face: negative Respiratory: negative for cough, dyspnea on exertion, emphysema, hemoptysis, sputum and wheezing Cardiovascular: positive for chest pain Gastrointestinal: positive for nausea, negative for abdominal pain, constipation, diarrhea, dysphagia, melena and  vomiting Genitourinary:negative Hematologic/lymphatic: negative for bleeding and easy bruising Musculoskeletal:positive for back pain and myalgias Neurological: negative for coordination problems, dizziness, gait problems, headaches, seizures, speech problems and weakness Behavioral/Psych: negative Endocrine: negative  HOME MEDICATIONS:  (Not in a hospital admission)  HOSPITAL MEDICATIONS: Prior to Admission:  (Not in a hospital admission)  VITALS: Blood pressure 123/94, pulse 63, temperature 97.9 F (36.6 C), temperature source Oral, resp. rate 20, SpO2 100.00%.  PHYSICAL EXAM: General appearance: alert, cooperative and moderate distress Neck: no adenopathy, no carotid bruit, no JVD, supple, symmetrical, trachea midline and thyroid not enlarged, symmetric, no tenderness/mass/nodules Lungs: clear to auscultation bilaterally Heart: regular rate and rhythm, S1, S2 normal, S4 present and no rub Abdomen: soft, non-tender; bowel sounds normal; no masses,  no organomegaly Extremities: extremities normal, atraumatic, no cyanosis or edema Pulses: 2+ and symmetric Skin: Skin color, texture, turgor normal. No rashes or lesions Neurologic: Grossly normal  LABS: Results for orders placed during the hospital encounter of 08/31/12 (from the past 48 hour(s))  CBC     Status: Abnormal   Collection Time    08/31/12  7:23 PM      Result Value Range   WBC 14.3 (*) 4.0 - 10.5 K/uL   RBC 4.63  4.22 - 5.81 MIL/uL   Hemoglobin 13.8  13.0 - 17.0 g/dL   HCT 40.7  39.0 - 52.0 %   MCV 87.9  78.0 - 100.0 fL   MCH 29.8  26.0 - 34.0 pg   MCHC 33.9  30.0 - 36.0 g/dL   RDW 12.2  11.5 - 15.5 %   Platelets 408 (*) 150 - 400 K/uL  PROTIME-INR     Status: None  Collection Time    08/31/12  7:23 PM      Result Value Range   Prothrombin Time 13.5  11.6 - 15.2 seconds   INR 1.04  0.00 - 1.49  APTT     Status: None   Collection Time    08/31/12  7:23 PM      Result Value Range   aPTT 28  24 - 37  seconds  POCT I-STAT TROPONIN I     Status: None   Collection Time    08/31/12  7:44 PM      Result Value Range   Troponin i, poc 0.02  0.00 - 0.08 ng/mL   Comment 3            Comment: Due to the release kinetics of cTnI,     a negative result within the first hours     of the onset of symptoms does not rule out     myocardial infarction with certainty.     If myocardial infarction is still suspected,     repeat the test at appropriate intervals.  POCT I-STAT, CHEM 8     Status: Abnormal   Collection Time    08/31/12  7:46 PM      Result Value Range   Sodium 141  135 - 145 mEq/L   Potassium 3.5  3.5 - 5.1 mEq/L   Chloride 105  96 - 112 mEq/L   BUN 23  6 - 23 mg/dL   Creatinine, Ser 1.50 (*) 0.50 - 1.35 mg/dL   Glucose, Bld 132 (*) 70 - 99 mg/dL   Calcium, Ion 1.09 (*) 1.12 - 1.23 mmol/L   TCO2 24  0 - 100 mmol/L   Hemoglobin 14.6  13.0 - 17.0 g/dL   HCT 43.0  39.0 - 52.0 %    IMAGING: No results found.  IMPRESSION: 1. Acute posterolateral ST segment elevation myocardial infarction consistent with occlusion of the left circumflex coronary artery, of recent onset  RECOMMENDATION: 1. Emergency diagnostic coronary angiography followed by appropriate revascularization. Opiates, intravenous nitroglycerin, beta blockers and aspirin are being administered. Some of the features of his pain do raise possible concern for aortic dissection, but he is not markedly hypertensive nor does he have any phenotypic features to suggest aortic aneurysm. His chest x-ray from 2012 does not show an enlarged mediastinum. Heparin was not administered. Angiomax may be preferred by the interventional cardiology team. Discussed with Dr. Peter Martinique.  Time Spent Directly with Patient: 30 minutes    Doree Kuehne 08/31/2012, 8:40 PM   Attending Cardiologist [facility]

## 2012-08-31 NOTE — ED Notes (Signed)
Cardiologist at bedside.  

## 2012-08-31 NOTE — ED Notes (Signed)
Code Stemi called

## 2012-08-31 NOTE — CV Procedure (Signed)
Cardiac Catheterization Procedure Note  Name: [name] MRN: [id number] DOB: 11/04/1955  Procedure: Left Heart Cath, Selective Coronary Angiography,  PTCA  of the left circumflex artery.  Indication: 57 year old white male with history of hypertension, hyperlipidemia, and strong family history of coronary disease presents with acute chest pain. Initial ECG showed ST segment depression in leads V1 through V3. Repeat ECG showed worsening ST segment depression in these leads with ST elevation in lead V6 consistent with a posterior lateral myocardial infarction. Since the first ECG showed only ST segment depression in the precordial leads STEMI  was not called until the second EKG was obtained.  Procedural Details:  The right wrist was prepped, draped, and anesthetized with 1% lidocaine. Using the modified Seldinger technique, a 6 French sheath was introduced into the right radial artery. 3 mg of verapamil was administered through the sheath, weight-based unfractionated heparin was administered intravenously. Standard Judkins catheters were used for selective coronary angiography and left ventriculography. Catheter exchanges were performed over an exchange length guidewire.  PROCEDURAL FINDINGS Hemodynamics: AO 103/87 with a mean of 95 mmHg LV 108/32 mmHg   Coronary angiography: Coronary dominance: right  Left mainstem: Normal  Left anterior descending (LAD): There is a 50% stenosis in the proximal LAD at the takeoff of the first septal perforator. The mid LAD is severely and diffusely diseased up to 80-90%. The LAD does give collaterals to the distal PDA.  Left circumflex (LCx): 100% occluded proximally.  Right coronary artery (RCA): Complex 90% angulated stenosis in the proximal RCA. This is a dominant vessel.  Left ventriculography: Not performed to conserve dye load.  PCI Note:  Following the diagnostic procedure, the decision was made to proceed with PCI of the left circumflex.   Weight-based bivalirudin was given for anticoagulation. Once a therapeutic ACT was achieved, a 6 [place] XB LAD 3.5 guide catheter was inserted.  A pro-water coronary guidewire was used to cross the lesion.  The lesion was predilated with a 2.5 mm compliant balloon.  With reperfusion the circumflex was noted to give rise to a single large branching marginal vessel. Given the severity of his coronary disease it was felt that this patient would require coronary bypass surgery this admission. For this reason we did not give him a P2Y12 inhibitor and decided to treat his circumflex disease with angioplasty only. We predilated the lesion with a 3.0 mm compliant balloon. Following PCI, there was less than 20% residual stenosis and TIMI-3 flow. Final angiography confirmed an excellent result. The patient tolerated the procedure well. At this point his chest pain was resolved. Patient was noted to be in atrial fibrillation throughout this procedure with a controlled ventricular response. There were no immediate procedural complications. A TR band was used for radial hemostasis. The patient was transferred to the ICU for further monitoring. We will continue with aspirin and transition  Angiomax to IV heparin. He will also be started on IV Integrilin.  PCI Data: Vessel - left circumflex/Segment -  proximal Percent Stenosis (pre)  100% TIMI-flow 0 PTCA only Percent Stenosis (post) less than 20% TIMI-flow (post) 3  Final Conclusions:   1. Severe three-vessel obstructive coronary disease. The culprit vessel was the proximal left circumflex. 2. Elevated left ventricular filling pressures 3. Successful balloon angioplasty of the left circumflex.   Recommendations:  Patient be transferred to the intensive care unit. We'll continue aspirin, IV heparin, and IV Integrilin. CT surgery consult will be obtained. Obtain an Echocardiogram to assess LV function.  Collier Salina  [facility] 08/31/2012, 10:08 PM

## 2012-08-31 NOTE — ED Provider Notes (Signed)
Tomoki Lucken S 8:00 PM patient discussed in sign out with Dr. Tomi Bamberger. Pt presents with atypical chest and back pains.  EKG was abnormal but labs unremarkable.  CT of chest pending to rule out dissection.  Dr. Tomi Bamberger also thinks cardiology consult should be made for abnormal EKG.  8:10PM  Patient seen and evaluated. Patient continues to have pains. Did receive 1 mg Dilaudid 10:15 mins ago. Now having some nausea and active vomiting. Zofran and Protonix ordered.  8:15PM Spoke with Dr. Sallyanne Kuster with [facility] cards.  He has reviewed the EKG and will be down to see pt.  Would like a repeat EKG performed.  8:20PM Dr. Sallyanne Kuster down to see pt.  Repeat EKG concerning for worsening changes and STEMI.  He recommends activating code STEMI and this was activated by Network engineer.   Pt discussed briefly with attending physician.  8:30PM Spoke briefly with Dr. [place] on call with STEMI cards.  Phone was handed to Dr. Sallyanne Kuster and pt will be prepped for cath.    Martie Lee, [place] 08/31/12 2047

## 2012-08-31 NOTE — ED Notes (Signed)
Pt vomiting, ED PA aware

## 2012-08-31 NOTE — ED Notes (Signed)
Pt arrived by EMS with c/o cp. Pt has been out of work x 3 weeks. Pt was lifting a mattress by himself when CP started. EMS administered ASA 325mg  and 2 Nitro. Pain increases with movement.  VS: BP-129/80 HR-84

## 2012-08-31 NOTE — Progress Notes (Signed)
ANTICOAGULATION CONSULT NOTE - Initial Consult  Pharmacy Consult for heparin and Integrilin Indication: CAD/STEMI  No Known Allergies  Patient Measurements: Height: 5\' 8"  (172.7 cm) Weight: 210 lb (95.255 kg) IBW/kg (Calculated) : 68.4 Heparin Dosing Weight: 90kg  Vital Signs: Temp: 97.9 F (36.6 C) (02/17 1914) Temp src: Oral (02/17 1914) BP: 115/79 mmHg (02/17 2103) Pulse Rate: 103 (02/17 2114)  Labs:  Recent Labs  08/31/12 1923 08/31/12 1946 08/31/12 2038 08/31/12 2054  HGB 13.8 14.6 14.6 14.6  HCT 40.7 43.0 41.6 43.0  PLT 408*  --  421*  --   APTT 28  --  28  --   LABPROT 13.5  --  13.0  --   INR 1.04  --  0.99  --   CREATININE  --  1.50* 1.39* 1.40*    Estimated Creatinine Clearance: 64.4 ml/min (by C-G formula based on Cr of 1.4).   Medical History: Past Medical History  Diagnosis Date  . Hypertension   . Borderline diabetic   . Mini stroke     2  . Hiatal hernia     Medications:  Prescriptions prior to admission  Medication Sig Dispense Refill  . amLODipine (NORVASC) 10 MG tablet Take 10 mg by mouth daily.      Marland Kitchen gemfibrozil (LOPID) 600 MG tablet Take 600 mg by mouth 2 (two) times daily before a meal.       Scheduled:  . [COMPLETED] bivalirudin      . [COMPLETED] eptifibatide      . [COMPLETED] furosemide  40 mg Intravenous Once  . [COMPLETED] heparin      . heparin      . [COMPLETED]  HYDROmorphone (DILAUDID) injection  2 mg Intravenous Once  . [COMPLETED] lidocaine      . [COMPLETED] metoprolol  5 mg Intravenous Once  . [COMPLETED] nitroGLYCERIN      . [COMPLETED] nitroGLYCERIN      . ondansetron      . [COMPLETED] ondansetron      . [COMPLETED] ondansetron (ZOFRAN) IV  4 mg Intravenous Once  . [COMPLETED] ondansetron (ZOFRAN) IV  4 mg Intravenous Once  . [START ON 09/01/2012] pantoprazole  40 mg Oral Q0600  . [START ON 09/01/2012] sodium chloride  3 mL Intravenous Q12H  . [COMPLETED] verapamil      . [DISCONTINUED] bivalirudin  (ANGIOMAX) infusion 5 mg/mL (Cath Lab,ACS,PCI indication)  1.75 mg/kg/hr Intravenous To Cath  . [DISCONTINUED] pantoprazole (PROTONIX) IV  40 mg Intravenous Once   Infusions:  . sodium chloride 1,000 mL (08/31/12 1929)  . sodium chloride    . sodium chloride    . eptifibatide    . [START ON 09/01/2012] heparin    . nitroGLYCERIN      Assessment: 57yo male with strong FH of CAD c/o CP, serial ECGs c/w posterior lateral MI, went to cath lab for STEMI upon second ECG which revealed severe 3-vessel onstructive coronary disease, to continue anticoagulation while awaiting CVTS consult.  Goal of Therapy:  Heparin level 0.3-0.5 units/ml while on Integrilin Monitor platelets by anticoagulation protocol: Yes   Plan:  Will continue Integrilin started in cath lab at 2 mcg/kg/hr.  Sheath was pulled at 2152; will begin heparin at 0600 in the am at 1100 units/hr.  Monitor heparin levels and CBC.  Rogue Bussing PharmD BCPS 08/31/2012,11:01 PM

## 2012-09-01 ENCOUNTER — Inpatient Hospital Stay (HOSPITAL_COMMUNITY): Payer: BC Managed Care – PPO

## 2012-09-01 ENCOUNTER — Inpatient Hospital Stay (HOSPITAL_COMMUNITY): Payer: BC Managed Care – PPO | Admitting: Anesthesiology

## 2012-09-01 ENCOUNTER — Encounter (HOSPITAL_COMMUNITY): Payer: Self-pay | Admitting: Anesthesiology

## 2012-09-01 ENCOUNTER — Encounter (HOSPITAL_COMMUNITY)
Admission: EM | Disposition: A | Discharge status: Home / Self Care | Payer: Self-pay | Source: Home / Self Care | Attending: Surgery

## 2012-09-01 ENCOUNTER — Encounter (HOSPITAL_COMMUNITY): Payer: Self-pay | Admitting: Internal Medicine

## 2012-09-01 DIAGNOSIS — I10 Essential (primary) hypertension: Secondary | ICD-10-CM | POA: Diagnosis present

## 2012-09-01 DIAGNOSIS — I213 ST elevation (STEMI) myocardial infarction of unspecified site: Secondary | ICD-10-CM

## 2012-09-01 DIAGNOSIS — K449 Diaphragmatic hernia without obstruction or gangrene: Secondary | ICD-10-CM | POA: Diagnosis present

## 2012-09-01 DIAGNOSIS — R7303 Prediabetes: Secondary | ICD-10-CM | POA: Diagnosis present

## 2012-09-01 DIAGNOSIS — I2510 Atherosclerotic heart disease of native coronary artery without angina pectoris: Secondary | ICD-10-CM

## 2012-09-01 HISTORY — PX: INTRAOPERATIVE TRANSESOPHAGEAL ECHOCARDIOGRAM: SHX5062

## 2012-09-01 HISTORY — PX: CORONARY ARTERY BYPASS GRAFT: SHX141

## 2012-09-01 HISTORY — DX: ST elevation (STEMI) myocardial infarction of unspecified site: I21.3

## 2012-09-01 LAB — POCT I-STAT 4, (NA,K, GLUC, HGB,HCT)
Glucose, Bld: 134 mg/dL — ABNORMAL HIGH (ref 70–99)
Glucose, Bld: 101 mg/dL — ABNORMAL HIGH (ref 70–99)
HCT: 34 % — ABNORMAL LOW (ref 39.0–52.0)
HCT: 28 % — ABNORMAL LOW (ref 39.0–52.0)
HCT: 32 % — ABNORMAL LOW (ref 39.0–52.0)
Hemoglobin: 11.6 g/dL — ABNORMAL LOW (ref 13.0–17.0)
Hemoglobin: 10.9 g/dL — ABNORMAL LOW (ref 13.0–17.0)
Hemoglobin: 11.6 g/dL — ABNORMAL LOW (ref 13.0–17.0)
Potassium: 3.6 mEq/L (ref 3.5–5.1)
Potassium: 3.5 mEq/L (ref 3.5–5.1)
Sodium: 141 mEq/L (ref 135–145)
Sodium: 138 mEq/L (ref 135–145)
Sodium: 138 mEq/L (ref 135–145)

## 2012-09-01 LAB — PROTIME-INR
INR: 1.34 (ref 0.00–1.49)
Prothrombin Time: 16.3 seconds — ABNORMAL HIGH (ref 11.6–15.2)

## 2012-09-01 LAB — LIPID PANEL
Cholesterol: 209 mg/dL — ABNORMAL HIGH (ref 0–200)
LDL Cholesterol: 136 mg/dL — ABNORMAL HIGH (ref 0–99)
VLDL: 43 mg/dL — ABNORMAL HIGH (ref 0–40)

## 2012-09-01 LAB — TROPONIN I: Troponin I: >20 ng/mL (ref <0.30)

## 2012-09-01 LAB — BASIC METABOLIC PANEL
CO2: 23 mEq/L (ref 19–32)
Calcium: 8.6 mg/dL (ref 8.4–10.5)
Creatinine, Ser: 1.37 mg/dL — ABNORMAL HIGH (ref 0.50–1.35)
Glucose, Bld: 148 mg/dL — ABNORMAL HIGH (ref 70–99)

## 2012-09-01 LAB — POCT I-STAT 3, ART BLOOD GAS (G3+)
Acid-base deficit: 2 mmol/L (ref 0.0–2.0)
O2 Saturation: 100 %
Patient temperature: 37.8
TCO2: 27 mmol/L (ref 0–100)
pCO2 arterial: 44.8 mmHg (ref 35.0–45.0)
pCO2 arterial: 40.2 mmHg (ref 35.0–45.0)
pH, Arterial: 7.372 (ref 7.350–7.450)
pH, Arterial: 7.408 (ref 7.350–7.450)
pH, Arterial: 7.401 (ref 7.350–7.450)
pH, Arterial: 7.341 — ABNORMAL LOW (ref 7.350–7.450)
pO2, Arterial: 326 mmHg — ABNORMAL HIGH (ref 80.0–100.0)
pO2, Arterial: 68 mmHg — ABNORMAL LOW (ref 80.0–100.0)
pO2, Arterial: 58 mmHg — ABNORMAL LOW (ref 80.0–100.0)

## 2012-09-01 LAB — CBC
HCT: 34.6 % — ABNORMAL LOW (ref 39.0–52.0)
Hemoglobin: 12 g/dL — ABNORMAL LOW (ref 13.0–17.0)
Hemoglobin: 12.7 g/dL — ABNORMAL LOW (ref 13.0–17.0)
MCH: 31.6 pg (ref 26.0–34.0)
MCH: 31.1 pg (ref 26.0–34.0)
MCHC: 35 g/dL (ref 30.0–36.0)
MCV: 90.1 fL (ref 78.0–100.0)
Platelets: 430 10*3/uL — ABNORMAL HIGH (ref 150–400)
RBC: 3.86 MIL/uL — ABNORMAL LOW (ref 4.22–5.81)
RBC: 4.1 MIL/uL — ABNORMAL LOW (ref 4.22–5.81)
RDW: 12.4 % (ref 11.5–15.5)

## 2012-09-01 LAB — POCT I-STAT, CHEM 8
Hemoglobin: 12.2 g/dL — ABNORMAL LOW (ref 13.0–17.0)
Sodium: 140 mEq/L (ref 135–145)
TCO2: 24 mmol/L (ref 0–100)

## 2012-09-01 LAB — CREATININE, SERUM
Creatinine, Ser: 1.18 mg/dL (ref 0.50–1.35)
GFR calc Af Amer: 77 mL/min — ABNORMAL LOW (ref >90)

## 2012-09-01 LAB — ABO/RH: ABO/RH(D): O POS

## 2012-09-01 LAB — HEMOGLOBIN AND HEMATOCRIT, BLOOD: HCT: 30.6 % — ABNORMAL LOW (ref 39.0–52.0)

## 2012-09-01 LAB — MRSA PCR SCREENING: MRSA by PCR: NEGATIVE

## 2012-09-01 LAB — MAGNESIUM: Magnesium: 2.9 mg/dL — ABNORMAL HIGH (ref 1.5–2.5)

## 2012-09-01 LAB — HEMOGLOBIN A1C: Mean Plasma Glucose: 126 mg/dL — ABNORMAL HIGH (ref <117)

## 2012-09-01 LAB — POCT ACTIVATED CLOTTING TIME: Activated Clotting Time: 600 seconds

## 2012-09-01 LAB — TYPE AND SCREEN: ABO/RH(D): O POS

## 2012-09-01 SURGERY — CORONARY ARTERY BYPASS GRAFTING (CABG)
Anesthesia: General | Site: Esophagus | Wound class: Clean

## 2012-09-01 MED ORDER — METOPROLOL TARTRATE 12.5 MG HALF TABLET
12.5000 mg | ORAL_TABLET | Freq: Two times a day (BID) | ORAL | Status: DC
  Administered 2012-09-02 – 2012-09-03 (×3): 12.5 mg via ORAL
  Filled 2012-09-01 (×5): qty 1

## 2012-09-01 MED ORDER — SODIUM CHLORIDE 0.45 % IV SOLN
INTRAVENOUS | Status: DC
  Administered 2012-09-01 – 2012-09-02 (×2): via INTRAVENOUS

## 2012-09-01 MED ORDER — SUCCINYLCHOLINE CHLORIDE 20 MG/ML IJ SOLN
INTRAMUSCULAR | Status: DC | PRN
  Administered 2012-09-01: 120 mg via INTRAVENOUS

## 2012-09-01 MED ORDER — ARTIFICIAL TEARS OP OINT
TOPICAL_OINTMENT | OPHTHALMIC | Status: DC | PRN
  Administered 2012-09-01: 1 via OPHTHALMIC

## 2012-09-01 MED ORDER — GEMFIBROZIL 600 MG PO TABS
600.0000 mg | ORAL_TABLET | Freq: Two times a day (BID) | ORAL | Status: DC
  Administered 2012-09-02 – 2012-09-05 (×7): 600 mg via ORAL
  Filled 2012-09-01 (×9): qty 1

## 2012-09-01 MED ORDER — ACETAMINOPHEN 500 MG PO TABS
1000.0000 mg | ORAL_TABLET | Freq: Four times a day (QID) | ORAL | Status: DC
  Administered 2012-09-02 – 2012-09-03 (×6): 1000 mg via ORAL
  Filled 2012-09-01 (×9): qty 2

## 2012-09-01 MED ORDER — ONDANSETRON HCL 4 MG/2ML IJ SOLN: 4.0000 mg | Freq: Four times a day (QID) | INTRAMUSCULAR | Status: DC | PRN

## 2012-09-01 MED ORDER — POTASSIUM CHLORIDE 10 MEQ/50ML IV SOLN
10.0000 meq | INTRAVENOUS | Status: AC
  Administered 2012-09-01 (×3): 10 meq via INTRAVENOUS

## 2012-09-01 MED ORDER — LACTATED RINGERS IV SOLN: 500.0000 mL | Freq: Once | INTRAVENOUS | Status: AC | PRN

## 2012-09-01 MED ORDER — SODIUM CHLORIDE 0.9 % IV SOLN
INTRAVENOUS | Status: AC
  Administered 2012-09-01: 69.8 mL/h via INTRAVENOUS
  Filled 2012-09-01: qty 40

## 2012-09-01 MED ORDER — DEXTROSE 5 % IV SOLN
1.5000 g | INTRAVENOUS | Status: DC
  Filled 2012-09-01: qty 1.5

## 2012-09-01 MED ORDER — ASPIRIN 81 MG PO CHEW: 324.0000 mg | CHEWABLE_TABLET | Freq: Every day | ORAL | Status: DC

## 2012-09-01 MED ORDER — VANCOMYCIN HCL 10 G IV SOLR
1250.0000 mg | INTRAVENOUS | Status: AC
  Administered 2012-09-01: 1250 mg via INTRAVENOUS
  Filled 2012-09-01: qty 1250

## 2012-09-01 MED ORDER — METOPROLOL TARTRATE 1 MG/ML IV SOLN
2.5000 mg | Freq: Four times a day (QID) | INTRAVENOUS | Status: DC
  Administered 2012-09-01: 2.5 mg via INTRAVENOUS
  Filled 2012-09-01 (×6): qty 5

## 2012-09-01 MED ORDER — SODIUM CHLORIDE 0.9 % IV SOLN: 250.0000 mL | INTRAVENOUS | Status: DC | PRN

## 2012-09-01 MED ORDER — SODIUM CHLORIDE 0.9 % IV SOLN
INTRAVENOUS | Status: DC
  Filled 2012-09-01 (×2): qty 1

## 2012-09-01 MED ORDER — FENTANYL CITRATE 0.05 MG/ML IJ SOLN
INTRAMUSCULAR | Status: DC | PRN
  Administered 2012-09-01: 50 ug via INTRAVENOUS
  Administered 2012-09-01 (×2): 250 ug via INTRAVENOUS
  Administered 2012-09-01: 500 ug via INTRAVENOUS
  Administered 2012-09-01 (×2): 100 ug via INTRAVENOUS

## 2012-09-01 MED ORDER — ACETAMINOPHEN 10 MG/ML IV SOLN
1000.0000 mg | Freq: Once | INTRAVENOUS | Status: AC
  Administered 2012-09-01: 1000 mg via INTRAVENOUS
  Filled 2012-09-01: qty 100

## 2012-09-01 MED ORDER — INSULIN REGULAR BOLUS VIA INFUSION
0.0000 [IU] | Freq: Three times a day (TID) | INTRAVENOUS | Status: DC
  Filled 2012-09-01: qty 10

## 2012-09-01 MED ORDER — BISACODYL 10 MG RE SUPP: 10.0000 mg | Freq: Every day | RECTAL | Status: DC

## 2012-09-01 MED ORDER — CHLORHEXIDINE GLUCONATE 4 % EX LIQD
60.0000 mL | Freq: Once | CUTANEOUS | Status: AC
  Administered 2012-09-01: 4 via TOPICAL

## 2012-09-01 MED ORDER — METOPROLOL TARTRATE 12.5 MG HALF TABLET
12.5000 mg | ORAL_TABLET | Freq: Once | ORAL | Status: DC
  Filled 2012-09-01: qty 1

## 2012-09-01 MED ORDER — PROPOFOL 10 MG/ML IV BOLUS
INTRAVENOUS | Status: DC | PRN
  Administered 2012-09-01: 50 mg via INTRAVENOUS

## 2012-09-01 MED ORDER — ALBUTEROL SULFATE (5 MG/ML) 0.5% IN NEBU
INHALATION_SOLUTION | RESPIRATORY_TRACT | Status: AC
  Administered 2012-09-01: 5 mg
  Filled 2012-09-01: qty 1

## 2012-09-01 MED ORDER — MIDAZOLAM HCL 5 MG/5ML IJ SOLN
INTRAMUSCULAR | Status: DC | PRN
  Administered 2012-09-01: 3 mg via INTRAVENOUS
  Administered 2012-09-01 (×2): 2 mg via INTRAVENOUS
  Administered 2012-09-01: 3 mg via INTRAVENOUS

## 2012-09-01 MED ORDER — DEXTROSE 5 % IV SOLN
30.0000 ug/min | INTRAVENOUS | Status: DC
  Filled 2012-09-01: qty 2

## 2012-09-01 MED ORDER — PHENYLEPHRINE HCL 10 MG/ML IJ SOLN
20.0000 mg | INTRAVENOUS | Status: DC | PRN
  Administered 2012-09-01: 7 ug/min via INTRAVENOUS

## 2012-09-01 MED ORDER — THROMBIN 20000 UNITS EX KIT
PACK | CUTANEOUS | Status: DC | PRN
  Administered 2012-09-01: 20000 [IU] via TOPICAL

## 2012-09-01 MED ORDER — ASPIRIN EC 325 MG PO TBEC
325.0000 mg | DELAYED_RELEASE_TABLET | Freq: Every day | ORAL | Status: DC
  Administered 2012-09-02 – 2012-09-03 (×2): 325 mg via ORAL
  Filled 2012-09-01 (×2): qty 1

## 2012-09-01 MED ORDER — MIDAZOLAM HCL 2 MG/2ML IJ SOLN
2.0000 mg | INTRAMUSCULAR | Status: DC | PRN
  Administered 2012-09-01: 1 mg via INTRAVENOUS
  Filled 2012-09-01: qty 2

## 2012-09-01 MED ORDER — BISACODYL 5 MG PO TBEC: 5.0000 mg | DELAYED_RELEASE_TABLET | Freq: Once | ORAL | Status: DC

## 2012-09-01 MED ORDER — VECURONIUM BROMIDE 10 MG IV SOLR
INTRAVENOUS | Status: DC | PRN
  Administered 2012-09-01: 2 mg via INTRAVENOUS
  Administered 2012-09-01: 5 mg via INTRAVENOUS

## 2012-09-01 MED ORDER — SODIUM CHLORIDE 0.9 % IV SOLN: INTRAVENOUS | Status: DC

## 2012-09-01 MED ORDER — HEMOSTATIC AGENTS (NO CHARGE) OPTIME
TOPICAL | Status: DC | PRN
  Administered 2012-09-01: 1 via TOPICAL

## 2012-09-01 MED ORDER — MAGNESIUM SULFATE 40 MG/ML IJ SOLN
INTRAMUSCULAR | Status: AC
  Filled 2012-09-01: qty 100

## 2012-09-01 MED ORDER — PROMETHAZINE HCL 25 MG/ML IJ SOLN
12.5000 mg | Freq: Four times a day (QID) | INTRAMUSCULAR | Status: DC | PRN
  Administered 2012-09-01: 12.5 mg via INTRAVENOUS
  Filled 2012-09-01: qty 1

## 2012-09-01 MED ORDER — CHLORHEXIDINE GLUCONATE 4 % EX LIQD
CUTANEOUS | Status: AC
  Administered 2012-09-01: 4 via TOPICAL
  Filled 2012-09-01: qty 60

## 2012-09-01 MED ORDER — CEFUROXIME SODIUM 1.5 G IJ SOLR
1.5000 g | INTRAMUSCULAR | Status: DC | PRN
  Administered 2012-09-01: 1.5 g via INTRAVENOUS

## 2012-09-01 MED ORDER — FAMOTIDINE IN NACL 20-0.9 MG/50ML-% IV SOLN
20.0000 mg | Freq: Two times a day (BID) | INTRAVENOUS | Status: AC
  Administered 2012-09-01 (×2): 20 mg via INTRAVENOUS
  Filled 2012-09-01: qty 50

## 2012-09-01 MED ORDER — SODIUM CHLORIDE 0.9 % IJ SOLN: 3.0000 mL | INTRAMUSCULAR | Status: DC | PRN

## 2012-09-01 MED ORDER — THROMBIN 20000 UNITS EX SOLR
OROMUCOSAL | Status: DC | PRN
  Administered 2012-09-01 (×3): via TOPICAL

## 2012-09-01 MED ORDER — DEXTROSE 5 % IV SOLN
1.5000 g | Freq: Two times a day (BID) | INTRAVENOUS | Status: AC
  Administered 2012-09-01 – 2012-09-03 (×4): 1.5 g via INTRAVENOUS
  Filled 2012-09-01 (×4): qty 1.5

## 2012-09-01 MED ORDER — DEXMEDETOMIDINE HCL IN NACL 200 MCG/50ML IV SOLN
0.1000 ug/kg/h | INTRAVENOUS | Status: DC
  Administered 2012-09-01 (×2): 0.7 ug/kg/h via INTRAVENOUS
  Filled 2012-09-01 (×2): qty 50

## 2012-09-01 MED ORDER — SODIUM CHLORIDE 0.9 % IV SOLN
INTRAVENOUS | Status: DC | PRN
  Administered 2012-09-01: 14:00:00 via INTRAVENOUS

## 2012-09-01 MED ORDER — POTASSIUM CHLORIDE 2 MEQ/ML IV SOLN
80.0000 meq | INTRAVENOUS | Status: DC
  Filled 2012-09-01: qty 40

## 2012-09-01 MED ORDER — SODIUM CHLORIDE 0.9 % IJ SOLN
3.0000 mL | Freq: Two times a day (BID) | INTRAMUSCULAR | Status: DC
  Administered 2012-09-02: 3 mL via INTRAVENOUS

## 2012-09-01 MED ORDER — DOPAMINE-DEXTROSE 3.2-5 MG/ML-% IV SOLN
2.0000 ug/kg/min | INTRAVENOUS | Status: AC
  Administered 2012-09-01: 2 ug/kg/min via INTRAVENOUS
  Filled 2012-09-01: qty 250

## 2012-09-01 MED ORDER — 0.9 % SODIUM CHLORIDE (POUR BTL) OPTIME
TOPICAL | Status: DC | PRN
  Administered 2012-09-01: 2000 mL

## 2012-09-01 MED ORDER — ACETAMINOPHEN 160 MG/5ML PO SOLN
975.0000 mg | Freq: Four times a day (QID) | ORAL | Status: DC
  Administered 2012-09-01: 975 mg
  Filled 2012-09-01: qty 40.6

## 2012-09-01 MED ORDER — NITROGLYCERIN IN D5W 200-5 MCG/ML-% IV SOLN: 0.0000 ug/min | INTRAVENOUS | Status: DC

## 2012-09-01 MED ORDER — DEXMEDETOMIDINE HCL IN NACL 400 MCG/100ML IV SOLN
0.1000 ug/kg/h | INTRAVENOUS | Status: AC
  Administered 2012-09-01: 0.2 ug/kg/h via INTRAVENOUS
  Filled 2012-09-01: qty 100

## 2012-09-01 MED ORDER — PANTOPRAZOLE SODIUM 40 MG PO TBEC
40.0000 mg | DELAYED_RELEASE_TABLET | Freq: Every day | ORAL | Status: DC
  Administered 2012-09-03: 40 mg via ORAL
  Filled 2012-09-01: qty 1

## 2012-09-01 MED ORDER — SODIUM CHLORIDE 0.9 % IV SOLN
INTRAVENOUS | Status: AC
  Administered 2012-09-01: 2 [IU]/h via INTRAVENOUS
  Filled 2012-09-01 (×2): qty 1

## 2012-09-01 MED ORDER — DOPAMINE-DEXTROSE 3.2-5 MG/ML-% IV SOLN: 2.0000 ug/kg/min | INTRAVENOUS | Status: DC

## 2012-09-01 MED ORDER — VERAPAMIL HCL 2.5 MG/ML IV SOLN
INTRAVENOUS | Status: AC
  Administered 2012-09-01: 11:00:00
  Filled 2012-09-01 (×2): qty 2.5

## 2012-09-01 MED ORDER — NITROGLYCERIN 0.2 MG/ML ON CALL CATH LAB
INTRAVENOUS | Status: DC | PRN
  Administered 2012-09-01: 200 ug via INTRAVENOUS

## 2012-09-01 MED ORDER — DEXTROSE 5 % IV SOLN
0.0000 ug/min | INTRAVENOUS | Status: DC
  Filled 2012-09-01: qty 2

## 2012-09-01 MED ORDER — SODIUM CHLORIDE 0.9 % IV BOLUS (SEPSIS)
250.0000 mL | Freq: Once | INTRAVENOUS | Status: AC
  Administered 2012-09-01: 250 mL via INTRAVENOUS

## 2012-09-01 MED ORDER — LACTATED RINGERS IV SOLN
INTRAVENOUS | Status: DC | PRN
  Administered 2012-09-01 (×3): via INTRAVENOUS

## 2012-09-01 MED ORDER — METOPROLOL TARTRATE 1 MG/ML IV SOLN
5.0000 mg | Freq: Once | INTRAVENOUS | Status: AC
  Administered 2012-09-01: 5 mg via INTRAVENOUS

## 2012-09-01 MED ORDER — DEXTROSE 5 % IV SOLN
0.5000 ug/min | INTRAVENOUS | Status: DC
  Filled 2012-09-01: qty 4

## 2012-09-01 MED ORDER — LACTATED RINGERS IV SOLN: INTRAVENOUS | Status: DC

## 2012-09-01 MED ORDER — HEPARIN SODIUM (PORCINE) 1000 UNIT/ML IJ SOLN
INTRAMUSCULAR | Status: DC | PRN
  Administered 2012-09-01: 32000 [IU] via INTRAVENOUS

## 2012-09-01 MED ORDER — DOCUSATE SODIUM 100 MG PO CAPS
200.0000 mg | ORAL_CAPSULE | Freq: Every day | ORAL | Status: DC
  Administered 2012-09-02 – 2012-09-03 (×2): 200 mg via ORAL
  Filled 2012-09-01 (×2): qty 2

## 2012-09-01 MED ORDER — THROMBIN 20000 UNITS EX SOLR
CUTANEOUS | Status: AC
  Filled 2012-09-01: qty 20000

## 2012-09-01 MED ORDER — DEXTROSE 5 % IV SOLN
750.0000 mg | INTRAVENOUS | Status: DC
  Filled 2012-09-01: qty 750

## 2012-09-01 MED ORDER — ALBUTEROL SULFATE (5 MG/ML) 0.5% IN NEBU: 2.5000 mg | INHALATION_SOLUTION | RESPIRATORY_TRACT | Status: DC | PRN

## 2012-09-01 MED ORDER — BISACODYL 5 MG PO TBEC
10.0000 mg | DELAYED_RELEASE_TABLET | Freq: Every day | ORAL | Status: DC
  Administered 2012-09-02 – 2012-09-03 (×2): 10 mg via ORAL
  Filled 2012-09-01: qty 2

## 2012-09-01 MED ORDER — METHYLPREDNISOLONE SODIUM SUCC 125 MG IJ SOLR
60.0000 mg | Freq: Once | INTRAMUSCULAR | Status: AC
  Administered 2012-09-01: 60 mg via INTRAVENOUS
  Filled 2012-09-01: qty 0.96

## 2012-09-01 MED ORDER — PROTAMINE SULFATE 10 MG/ML IV SOLN
INTRAVENOUS | Status: DC | PRN
  Administered 2012-09-01: 150 mg via INTRAVENOUS
  Administered 2012-09-01: 130 mg via INTRAVENOUS
  Administered 2012-09-01: 20 mg via INTRAVENOUS

## 2012-09-01 MED ORDER — METOCLOPRAMIDE HCL 5 MG/ML IJ SOLN
10.0000 mg | Freq: Three times a day (TID) | INTRAMUSCULAR | Status: AC
  Administered 2012-09-01 – 2012-09-02 (×4): 10 mg via INTRAVENOUS
  Filled 2012-09-01 (×4): qty 2

## 2012-09-01 MED ORDER — LACTATED RINGERS IV SOLN
INTRAVENOUS | Status: DC | PRN
  Administered 2012-09-01 (×2): via INTRAVENOUS

## 2012-09-01 MED ORDER — ALBUMIN HUMAN 5 % IV SOLN: 250.0000 mL | INTRAVENOUS | Status: AC | PRN

## 2012-09-01 MED ORDER — METHYLPREDNISOLONE SODIUM SUCC 125 MG IJ SOLR
INTRAMUSCULAR | Status: AC
  Administered 2012-09-01: 60 mg via INTRAVENOUS
  Filled 2012-09-01: qty 2

## 2012-09-01 MED ORDER — MORPHINE SULFATE 2 MG/ML IJ SOLN
1.0000 mg | INTRAMUSCULAR | Status: AC | PRN
  Administered 2012-09-02: 2 mg via INTRAVENOUS
  Filled 2012-09-01 (×3): qty 1

## 2012-09-01 MED ORDER — OXYCODONE HCL 5 MG PO TABS
5.0000 mg | ORAL_TABLET | ORAL | Status: DC | PRN
  Administered 2012-09-02 (×4): 10 mg via ORAL
  Administered 2012-09-03: 5 mg via ORAL
  Administered 2012-09-03: 10 mg via ORAL
  Filled 2012-09-01 (×3): qty 2
  Filled 2012-09-01: qty 1
  Filled 2012-09-01 (×2): qty 2

## 2012-09-01 MED ORDER — METOPROLOL TARTRATE 25 MG/10 ML ORAL SUSPENSION
12.5000 mg | Freq: Two times a day (BID) | ORAL | Status: DC
  Filled 2012-09-01 (×5): qty 5

## 2012-09-01 MED ORDER — VANCOMYCIN HCL IN DEXTROSE 1-5 GM/200ML-% IV SOLN
1000.0000 mg | Freq: Once | INTRAVENOUS | Status: AC
Start: 2012-09-01 — End: 2012-09-01
  Administered 2012-09-01: 1000 mg via INTRAVENOUS
  Filled 2012-09-01: qty 200

## 2012-09-01 MED ORDER — NITROGLYCERIN IN D5W 200-5 MCG/ML-% IV SOLN
2.0000 ug/min | INTRAVENOUS | Status: DC
  Filled 2012-09-01: qty 250

## 2012-09-01 MED ORDER — ROCURONIUM BROMIDE 100 MG/10ML IV SOLN
INTRAVENOUS | Status: DC | PRN
  Administered 2012-09-01 (×2): 50 mg via INTRAVENOUS

## 2012-09-01 MED ORDER — MAGNESIUM SULFATE 50 % IJ SOLN
40.0000 meq | INTRAMUSCULAR | Status: DC
  Filled 2012-09-01: qty 10

## 2012-09-01 MED ORDER — TEMAZEPAM 15 MG PO CAPS: 15.0000 mg | ORAL_CAPSULE | Freq: Once | ORAL | Status: DC | PRN

## 2012-09-01 MED ORDER — MORPHINE SULFATE 2 MG/ML IJ SOLN
2.0000 mg | INTRAMUSCULAR | Status: DC | PRN
  Administered 2012-09-01 – 2012-09-02 (×7): 2 mg via INTRAVENOUS
  Administered 2012-09-03 (×2): 4 mg via INTRAVENOUS
  Filled 2012-09-01: qty 2
  Filled 2012-09-01 (×4): qty 1
  Filled 2012-09-01: qty 2
  Filled 2012-09-01: qty 1

## 2012-09-01 MED ORDER — ALBUMIN HUMAN 5 % IV SOLN
INTRAVENOUS | Status: DC | PRN
  Administered 2012-09-01: 14:00:00 via INTRAVENOUS

## 2012-09-01 MED ORDER — METOPROLOL TARTRATE 1 MG/ML IV SOLN: 2.5000 mg | INTRAVENOUS | Status: DC | PRN

## 2012-09-01 MED ORDER — MAGNESIUM SULFATE 40 MG/ML IJ SOLN
4.0000 g | Freq: Once | INTRAMUSCULAR | Status: AC
  Administered 2012-09-01: 4 g via INTRAVENOUS

## 2012-09-01 MED FILL — Dextrose Inj 5%: INTRAVENOUS | Qty: 50 | Status: AC

## 2012-09-01 SURGICAL SUPPLY — 101 items
ATTRACTOMAT 16X20 MAGNETIC DRP (DRAPES) ×3 IMPLANT
BAG DECANTER FOR FLEXI CONT (MISCELLANEOUS) ×3 IMPLANT
BANDAGE ELASTIC 4 VELCRO ST LF (GAUZE/BANDAGES/DRESSINGS) ×3 IMPLANT
BANDAGE ELASTIC 6 VELCRO ST LF (GAUZE/BANDAGES/DRESSINGS) ×3 IMPLANT
BANDAGE GAUZE ELAST BULKY 4 IN (GAUZE/BANDAGES/DRESSINGS) ×3 IMPLANT
BASKET HEART (ORDER IN 25'S) (MISCELLANEOUS) ×1
BASKET HEART (ORDER IN 25S) (MISCELLANEOUS) ×2 IMPLANT
BLADE STERNUM SYSTEM 6 (BLADE) ×3 IMPLANT
BLADE SURG 11 STRL SS (BLADE) ×3 IMPLANT
CANISTER SUCTION 2500CC (MISCELLANEOUS) ×3 IMPLANT
CATH ROBINSON RED A/P 18FR (CATHETERS) ×6 IMPLANT
CATH THORACIC 28FR (CATHETERS) ×3 IMPLANT
CATH THORACIC 28FR RT ANG (CATHETERS) IMPLANT
CATH THORACIC 36FR (CATHETERS) ×3 IMPLANT
CATH THORACIC 36FR RT ANG (CATHETERS) ×3 IMPLANT
CLIP TI MEDIUM 24 (CLIP) IMPLANT
CLIP TI WIDE RED SMALL 24 (CLIP) ×3 IMPLANT
CLOTH BEACON ORANGE TIMEOUT ST (SAFETY) ×3 IMPLANT
COVER SURGICAL LIGHT HANDLE (MISCELLANEOUS) ×3 IMPLANT
CRADLE DONUT ADULT HEAD (MISCELLANEOUS) ×3 IMPLANT
DERMABOND ADVANCED (GAUZE/BANDAGES/DRESSINGS) ×1
DERMABOND ADVANCED .7 DNX12 (GAUZE/BANDAGES/DRESSINGS) ×2 IMPLANT
DRAPE CARDIOVASCULAR INCISE (DRAPES) ×1
DRAPE SLUSH MACHINE 52X66 (DRAPES) IMPLANT
DRAPE SLUSH/WARMER DISC (DRAPES) ×3 IMPLANT
DRAPE SRG 135X102X78XABS (DRAPES) ×2 IMPLANT
DRSG COVADERM 4X14 (GAUZE/BANDAGES/DRESSINGS) ×3 IMPLANT
ELECT CAUTERY BLADE 6.4 (BLADE) ×3 IMPLANT
ELECT REM PT RETURN 9FT ADLT (ELECTROSURGICAL) ×6
ELECTRODE REM PT RTRN 9FT ADLT (ELECTROSURGICAL) ×4 IMPLANT
GLOVE BIO SURGEON STRL SZ 6 (GLOVE) ×9 IMPLANT
GLOVE BIO SURGEON STRL SZ 6.5 (GLOVE) ×6 IMPLANT
GLOVE BIO SURGEON STRL SZ7 (GLOVE) IMPLANT
GLOVE BIO SURGEON STRL SZ7.5 (GLOVE) IMPLANT
GLOVE BIOGEL PI IND STRL 6 (GLOVE) ×4 IMPLANT
GLOVE BIOGEL PI IND STRL 6.5 (GLOVE) ×4 IMPLANT
GLOVE BIOGEL PI IND STRL 7.0 (GLOVE) IMPLANT
GLOVE BIOGEL PI INDICATOR 6 (GLOVE) ×2
GLOVE BIOGEL PI INDICATOR 6.5 (GLOVE) ×2
GLOVE BIOGEL PI INDICATOR 7.0 (GLOVE)
GLOVE EUDERMIC 7 POWDERFREE (GLOVE) ×6 IMPLANT
GLOVE ORTHO TXT STRL SZ7.5 (GLOVE) IMPLANT
GOWN PREVENTION PLUS XLARGE (GOWN DISPOSABLE) ×3 IMPLANT
GOWN STRL NON-REIN LRG LVL3 (GOWN DISPOSABLE) ×18 IMPLANT
HEMOSTAT POWDER SURGIFOAM 1G (HEMOSTASIS) ×9 IMPLANT
HEMOSTAT SURGICEL 2X14 (HEMOSTASIS) ×3 IMPLANT
INSERT FOGARTY 61MM (MISCELLANEOUS) IMPLANT
INSERT FOGARTY XLG (MISCELLANEOUS) IMPLANT
KIT BASIN OR (CUSTOM PROCEDURE TRAY) ×3 IMPLANT
KIT CATH CPB BARTLE (MISCELLANEOUS) ×3 IMPLANT
KIT ROOM TURNOVER OR (KITS) ×3 IMPLANT
KIT SUCTION CATH 14FR (SUCTIONS) ×3 IMPLANT
KIT VASOVIEW W/TROCAR VH 2000 (KITS) ×3 IMPLANT
NS IRRIG 1000ML POUR BTL (IV SOLUTION) ×18 IMPLANT
PACK OPEN HEART (CUSTOM PROCEDURE TRAY) ×3 IMPLANT
PAD ARMBOARD 7.5X6 YLW CONV (MISCELLANEOUS) ×6 IMPLANT
PENCIL BUTTON HOLSTER BLD 10FT (ELECTRODE) ×3 IMPLANT
PUNCH AORTIC ROTATE 4.0MM (MISCELLANEOUS) IMPLANT
PUNCH AORTIC ROTATE 4.5MM 8IN (MISCELLANEOUS) ×3 IMPLANT
PUNCH AORTIC ROTATE 5MM 8IN (MISCELLANEOUS) IMPLANT
SPONGE GAUZE 4X4 12PLY (GAUZE/BANDAGES/DRESSINGS) ×9 IMPLANT
SPONGE INTESTINAL PEANUT (DISPOSABLE) IMPLANT
SPONGE LAP 18X18 X RAY DECT (DISPOSABLE) ×3 IMPLANT
SPONGE LAP 4X18 X RAY DECT (DISPOSABLE) ×3 IMPLANT
SUT BONE WAX W31G (SUTURE) ×3 IMPLANT
SUT MNCRL AB 4-0 PS2 18 (SUTURE) ×6 IMPLANT
SUT PROLENE 3 0 SH DA (SUTURE) IMPLANT
SUT PROLENE 3 0 SH1 36 (SUTURE) ×3 IMPLANT
SUT PROLENE 4 0 RB 1 (SUTURE)
SUT PROLENE 4 0 SH DA (SUTURE) IMPLANT
SUT PROLENE 4-0 RB1 .5 CRCL 36 (SUTURE) IMPLANT
SUT PROLENE 5 0 C 1 36 (SUTURE) IMPLANT
SUT PROLENE 6 0 C 1 30 (SUTURE) IMPLANT
SUT PROLENE 7 0 BV 1 (SUTURE) IMPLANT
SUT PROLENE 7 0 BV1 MDA (SUTURE) ×3 IMPLANT
SUT PROLENE 8 0 BV175 6 (SUTURE) IMPLANT
SUT SILK  1 MH (SUTURE)
SUT SILK 1 MH (SUTURE) IMPLANT
SUT STEEL STERNAL CCS#1 18IN (SUTURE) IMPLANT
SUT STEEL SZ 6 DBL 3X14 BALL (SUTURE) ×9 IMPLANT
SUT VIC AB 1 CTX 36 (SUTURE) ×2
SUT VIC AB 1 CTX36XBRD ANBCTR (SUTURE) ×4 IMPLANT
SUT VIC AB 2-0 CT1 27 (SUTURE) ×1
SUT VIC AB 2-0 CT1 TAPERPNT 27 (SUTURE) ×2 IMPLANT
SUT VIC AB 2-0 CTX 27 (SUTURE) IMPLANT
SUT VIC AB 2-0 UR6 27 (SUTURE) ×3 IMPLANT
SUT VIC AB 3-0 SH 27 (SUTURE)
SUT VIC AB 3-0 SH 27X BRD (SUTURE) IMPLANT
SUT VIC AB 3-0 X1 27 (SUTURE) IMPLANT
SUT VICRYL 4-0 PS2 18IN ABS (SUTURE) IMPLANT
SUTURE E-PAK OPEN HEART (SUTURE) ×3 IMPLANT
SYSTEM SAHARA CHEST DRAIN ATS (WOUND CARE) ×3 IMPLANT
TAPE CLOTH SURG 4X10 WHT LF (GAUZE/BANDAGES/DRESSINGS) ×9 IMPLANT
TAPE PAPER 3X10 WHT MICROPORE (GAUZE/BANDAGES/DRESSINGS) ×3 IMPLANT
TOWEL OR 17X24 6PK STRL BLUE (TOWEL DISPOSABLE) ×3 IMPLANT
TOWEL OR 17X26 10 PK STRL BLUE (TOWEL DISPOSABLE) ×6 IMPLANT
TRAY FOLEY IC TEMP SENS 14FR (CATHETERS) ×3 IMPLANT
TUBE SUCT INTRACARD DLP 20F (MISCELLANEOUS) ×3 IMPLANT
TUBING INSUFFLATION 10FT LAP (TUBING) ×3 IMPLANT
UNDERPAD 30X30 INCONTINENT (UNDERPADS AND DIAPERS) ×3 IMPLANT
WATER STERILE IRR 1000ML POUR (IV SOLUTION) ×6 IMPLANT

## 2012-09-01 NOTE — Consult Note (Signed)
[address]       ,[place] [address]             [phone]      Reason for Consult: Severe Multivessel coronary occlusive disease, s/p acute posterolateral STEMI Referring Physician:  Dr. Ronni Rumble Mastromarino is an 57 y.o. male.  HPI:   Patient with history of hypertension, severe mixed hyperlipidemia, borderline diabetes, and previous stroke without residual deficit who presented with severe intrascapular pain as well as substernal chest pain associated with nausea and shortness of breath. His initial electrocardiogram showed ST segment depression and T wave inversion in leads V1-V3. Repeat electrocardiogram showed worsening ST segment depression in the anterior precordial leads as well as ST segment elevation in lead V6 consistent with acute posterior lateral ST segment elevation MI. He was taken the Cath Lab overnight where the culprit was found to be an occluded proximal left circumflex coronary artery. He also had high-grade LAD and right coronary stenoses and the left circumflex was opened with PTCA without a stent. He was started on Integrilin and transitioned to heparin. Overnight he did have 2 brief runs of nonsustained ventricular tachycardia as well some nausea and vomiting. At the present time he denies any chest or back pain. His initial troponin was 0.02, and this am was > 20.  Past Medical History  Diagnosis Date  . Hypertension   . Borderline diabetic   . Mini stroke     2  . Hiatal hernia   . STEMI (ST elevation myocardial infarction)     History reviewed. No pertinent past surgical history.  No family history of heart disease  Social History:  reports that he has never smoked. He does not have any smokeless tobacco history on file. He reports that he does not drink alcohol or use illicit drugs.  Allergies: No Known Allergies  Medications:  I have reviewed the patient's current medications. Prior to Admission:  Prescriptions prior to  admission  Medication Sig Dispense Refill  . amLODipine (NORVASC) 10 MG tablet Take 10 mg by mouth daily.      Marland Kitchen gemfibrozil (LOPID) 600 MG tablet Take 600 mg by mouth 2 (two) times daily before a meal.       Scheduled: . aminocaproic acid (AMICAR) for OHS   Intravenous To OR  . bisacodyl  5 mg Oral Once  . cefUROXime (ZINACEF)  IV  1.5 g Intravenous To OR  . cefUROXime (ZINACEF)  IV  750 mg Intravenous To OR  . dexmedetomidine  0.1-0.7 mcg/kg/hr Intravenous To OR  . DOPamine  2-20 mcg/kg/min Intravenous To OR  . epinephrine  0.5-20 mcg/min Intravenous To OR  . heparin      . insulin (NOVOLIN-R) infusion   Intravenous To OR  . magnesium sulfate  40 mEq Other To OR  . metoprolol  2.5 mg Intravenous Q6H  . metoprolol tartrate  12.5 mg Oral Once  . nitroGLYCERIN  2-200 mcg/min Intravenous To OR  . nitroglycerin-verapamil-HEPARIN-sodium bicarbonate irrigation for artery spasm   Irrigation To OR  . ondansetron      . pantoprazole  40 mg Oral Q0600  . phenylephrine (NEO-SYNEPHRINE) Adult infusion  30-200 mcg/min Intravenous To OR  . potassium chloride  80 mEq Other To OR  . sodium chloride  3 mL Intravenous Q12H  . vancomycin  1,250 mg Intravenous To OR   Continuous: . sodium chloride 1,000 mL (08/31/12 1929)  . sodium chloride 20 mL/hr at 08/31/12 2045  .  nitroGLYCERIN 5 mcg/min (08/31/12 2245)   [id number] chloride, acetaminophen, nitroGLYCERIN, ondansetron (ZOFRAN) IV, promethazine, sodium chloride  Results for orders placed during the hospital encounter of 08/31/12 (from the past 48 hour(s))  CBC     Status: Abnormal   Collection Time    08/31/12  7:23 PM      Result Value Range   WBC 14.3 (*) 4.0 - 10.5 K/uL   RBC 4.63  4.22 - 5.81 MIL/uL   Hemoglobin 13.8  13.0 - 17.0 g/dL   HCT 40.7  39.0 - 52.0 %   MCV 87.9  78.0 - 100.0 fL   MCH 29.8  26.0 - 34.0 pg   MCHC 33.9  30.0 - 36.0 g/dL   RDW 12.2  11.5 - 15.5 %   Platelets 408 (*) 150 - 400 K/uL  PROTIME-INR     Status:  None   Collection Time    08/31/12  7:23 PM      Result Value Range   Prothrombin Time 13.5  11.6 - 15.2 seconds   INR 1.04  0.00 - 1.49  APTT     Status: None   Collection Time    08/31/12  7:23 PM      Result Value Range   aPTT 28  24 - 37 seconds  POCT I-STAT TROPONIN I     Status: None   Collection Time    08/31/12  7:44 PM      Result Value Range   Troponin i, poc 0.02  0.00 - 0.08 ng/mL   Comment 3            Comment: Due to the release kinetics of cTnI,     a negative result within the first hours     of the onset of symptoms does not rule out     myocardial infarction with certainty.     If myocardial infarction is still suspected,     repeat the test at appropriate intervals.  POCT I-STAT, CHEM 8     Status: Abnormal   Collection Time    08/31/12  7:46 PM      Result Value Range   Sodium 141  135 - 145 mEq/L   Potassium 3.5  3.5 - 5.1 mEq/L   Chloride 105  96 - 112 mEq/L   BUN 23  6 - 23 mg/dL   Creatinine, Ser 1.50 (*) 0.50 - 1.35 mg/dL   Glucose, Bld 132 (*) 70 - 99 mg/dL   Calcium, Ion 1.09 (*) 1.12 - 1.23 mmol/L   TCO2 24  0 - 100 mmol/L   Hemoglobin 14.6  13.0 - 17.0 g/dL   HCT 43.0  39.0 - 52.0 %  APTT     Status: None   Collection Time    08/31/12  8:38 PM      Result Value Range   aPTT 28  24 - 37 seconds  CBC     Status: Abnormal   Collection Time    08/31/12  8:38 PM      Result Value Range   WBC 18.6 (*) 4.0 - 10.5 K/uL   RBC 4.72  4.22 - 5.81 MIL/uL   Hemoglobin 14.6  13.0 - 17.0 g/dL   HCT 41.6  39.0 - 52.0 %   MCV 88.1  78.0 - 100.0 fL   MCH 30.9  26.0 - 34.0 pg   MCHC 35.1  30.0 - 36.0 g/dL   RDW 12.1  11.5 - 15.5 %   Platelets  421 (*) 150 - 400 K/uL  COMPREHENSIVE METABOLIC PANEL     Status: Abnormal   Collection Time    08/31/12  8:38 PM      Result Value Range   Sodium 140  135 - 145 mEq/L   Potassium 3.6  3.5 - 5.1 mEq/L   Chloride 103  96 - 112 mEq/L   CO2 24  19 - 32 mEq/L   Glucose, Bld 129 (*) 70 - 99 mg/dL   BUN 25 (*) 6  - 23 mg/dL   Creatinine, Ser 1.39 (*) 0.50 - 1.35 mg/dL   Calcium 8.9  8.4 - 10.5 mg/dL   Total Protein 7.5  6.0 - 8.3 g/dL   Albumin 3.8  3.5 - 5.2 g/dL   AST 34  0 - 37 U/L   ALT 29  0 - 53 U/L   Alkaline Phosphatase 65  39 - 117 U/L   Total Bilirubin 0.3  0.3 - 1.2 mg/dL   GFR calc non Af Amer 54 (*) >90 mL/min   GFR calc Af Amer 63 (*) >90 mL/min   Comment:            The eGFR has been calculated     using the CKD EPI equation.     This calculation has not been     validated in all clinical     situations.     eGFR's persistently     <90 mL/min signify     possible Chronic Kidney Disease.  PROTIME-INR     Status: None   Collection Time    08/31/12  8:38 PM      Result Value Range   Prothrombin Time 13.0  11.6 - 15.2 seconds   INR 0.99  0.00 - 1.49  POCT I-STAT TROPONIN I     Status: Abnormal   Collection Time    08/31/12  8:52 PM      Result Value Range   Troponin i, poc 0.24 (*) 0.00 - 0.08 ng/mL   Comment NOTIFIED PHYSICIAN     Comment 3            Comment: Due to the release kinetics of cTnI,     a negative result within the first hours     of the onset of symptoms does not rule out     myocardial infarction with certainty.     If myocardial infarction is still suspected,     repeat the test at appropriate intervals.  POCT I-STAT, CHEM 8     Status: Abnormal   Collection Time    08/31/12  8:54 PM      Result Value Range   Sodium 141  135 - 145 mEq/L   Potassium 3.6  3.5 - 5.1 mEq/L   Chloride 106  96 - 112 mEq/L   BUN 24 (*) 6 - 23 mg/dL   Creatinine, Ser 1.40 (*) 0.50 - 1.35 mg/dL   Glucose, Bld 129 (*) 70 - 99 mg/dL   Calcium, Ion 1.10 (*) 1.12 - 1.23 mmol/L   TCO2 25  0 - 100 mmol/L   Hemoglobin 14.6  13.0 - 17.0 g/dL   HCT 43.0  39.0 - 52.0 %  MRSA PCR SCREENING     Status: None   Collection Time    08/31/12 10:32 PM      Result Value Range   MRSA by PCR NEGATIVE  NEGATIVE   Comment:  The GeneXpert MRSA Assay (FDA     approved for NASAL  specimens     only), is one component of a     comprehensive MRSA colonization     surveillance program. It is not     intended to diagnose MRSA     infection nor to guide or     monitor treatment for     MRSA infections.  LIPID PANEL     Status: Abnormal   Collection Time    08/31/12 11:38 PM      Result Value Range   Cholesterol 209 (*) 0 - 200 mg/dL   Triglycerides 214 (*) <150 mg/dL   HDL 30 (*) >39 mg/dL   Total CHOL/HDL Ratio 7.0     VLDL 43 (*) 0 - 40 mg/dL   LDL Cholesterol 136 (*) 0 - 99 mg/dL   Comment:            Total Cholesterol/HDL:CHD Risk     Coronary Heart Disease Risk Table                         Men   Women      1/2 Average Risk   3.4   3.3      Average Risk       5.0   4.4      2 X Average Risk   9.6   7.1      3 X Average Risk  23.4   11.0                Use the calculated Patient Ratio     above and the CHD Risk Table     to determine the patient's CHD Risk.                ATP III CLASSIFICATION (LDL):      <100     mg/dL   Optimal      100-129  mg/dL   Near or Above                        Optimal      130-159  mg/dL   Borderline      160-189  mg/dL   High      >190     mg/dL   Very High  TYPE AND SCREEN     Status: None   Collection Time    08/31/12 11:38 PM      Result Value Range   ABO/RH(D) O POS     Antibody Screen NEG     Sample Expiration 09/03/2012    ABO/RH     Status: None   Collection Time    08/31/12 11:45 PM      Result Value Range   ABO/RH(D) O POS    MAGNESIUM     Status: None   Collection Time    09/01/12  3:00 AM      Result Value Range   Magnesium 1.9  1.5 - 2.5 mg/dL  BASIC METABOLIC PANEL     Status: Abnormal   Collection Time    09/01/12  3:00 AM      Result Value Range   Sodium 140  135 - 145 mEq/L   Potassium 4.3  3.5 - 5.1 mEq/L   Chloride 103  96 - 112 mEq/L   CO2 23  19 - 32 mEq/L   Glucose, Bld 148 (*) 70 - 99  mg/dL   BUN 25 (*) 6 - 23 mg/dL   Creatinine, Ser 1.37 (*) 0.50 - 1.35 mg/dL   Calcium 8.6   8.4 - 10.5 mg/dL   GFR calc non Af Amer 55 (*) >90 mL/min   GFR calc Af Amer 64 (*) >90 mL/min   Comment:            The eGFR has been calculated     using the CKD EPI equation.     This calculation has not been     validated in all clinical     situations.     eGFR's persistently     <90 mL/min signify     possible Chronic Kidney Disease.  CBC     Status: Abnormal   Collection Time    09/01/12  3:00 AM      Result Value Range   WBC 16.7 (*) 4.0 - 10.5 K/uL   RBC 4.53  4.22 - 5.81 MIL/uL   Hemoglobin 14.3  13.0 - 17.0 g/dL   HCT 40.8  39.0 - 52.0 %   MCV 90.1  78.0 - 100.0 fL   MCH 31.6  26.0 - 34.0 pg   MCHC 35.0  30.0 - 36.0 g/dL   RDW 12.4  11.5 - 15.5 %   Platelets 430 (*) 150 - 400 K/uL  TROPONIN I     Status: Abnormal   Collection Time    09/01/12  3:00 AM      Result Value Range   Troponin I >20.00 (*) <0.30 ng/mL   Comment:            Due to the release kinetics of cTnI,     a negative result within the first hours     of the onset of symptoms does not rule out     myocardial infarction with certainty.     If myocardial infarction is still suspected,     repeat the test at appropriate intervals.     CRITICAL RESULT CALLED TO, READ BACK BY AND VERIFIED WITH:     ROBERTS,J RN 09/01/2012 0436 JORDANS    Dg Chest Port 1 View  08/31/2012  *RADIOLOGY REPORT*  Clinical Data: Chest pain.  Code stemi.  PORTABLE CHEST - 1 VIEW  Comparison: None.  Findings: 2104 hours.  There is lordotic positioning and suboptimal inspiration.  The heart size and mediastinal contours are normal. There is mild atelectasis at both lung bases.  No edema, confluent airspace opacity or significant pleural effusion is seen. Telemetry leads overlie the chest.  IMPRESSION: Mild bibasilar atelectasis.  No confluent airspace opacity or edema.   Original Report Authenticated By: Richardean Sale, M.D.     Review of Systems  Constitutional: Positive for malaise/fatigue. Negative for fever, chills and  weight loss.  HENT: Negative.   Eyes: Negative.   Respiratory: Positive for shortness of breath.   Cardiovascular: Positive for chest pain.  Gastrointestinal: Positive for nausea and vomiting.  Genitourinary: Negative.   Musculoskeletal: Positive for myalgias and back pain.  Skin: Negative.   Neurological: Negative.   Endo/Heme/Allergies: Negative.   Psychiatric/Behavioral: Negative.    Blood pressure 109/81, pulse 99, temperature 98.6 F (37 C), temperature source Axillary, resp. rate 13, height 5\' 8"  (1.727 m), weight 95.255 kg (210 lb), SpO2 95.00%. Physical Exam  Constitutional: He is oriented to person, place, and time. He appears well-developed and well-nourished. No distress.  HENT:  Head: Normocephalic and atraumatic.  Mouth/Throat: Oropharynx is clear and moist.  Eyes: Conjunctivae and EOM are normal. Pupils are equal, round, and reactive to light.  Neck: Normal range of motion. No JVD present.  Cardiovascular: Normal rate, regular rhythm and intact distal pulses.  Exam reveals no gallop and no friction rub.   No murmur heard. Respiratory: Effort normal and breath sounds normal. No respiratory distress. He has no rales.  GI: Soft. Bowel sounds are normal. He exhibits no distension and no mass. There is no tenderness.  Musculoskeletal: Normal range of motion. He exhibits no edema.  Neurological: He is alert and oriented to person, place, and time. He has normal strength. No cranial nerve deficit or sensory deficit.  Skin: Skin is warm and dry.  Psychiatric: He has a normal mood and affect.   Cardiac Cath:  PROCEDURAL FINDINGS Hemodynamics: AO 103/87 with a mean of 95 mmHg LV 108/32 mmHg              Coronary angiography: Coronary dominance: right  Left mainstem: Normal  Left anterior descending (LAD): There is a 50% stenosis in the proximal LAD at the takeoff of the first septal perforator. The mid LAD is severely and diffusely diseased up to 80-90%. The LAD does  give collaterals to the distal PDA.  Left circumflex (LCx): 100% occluded proximally.  Right coronary artery (RCA): Complex 90% angulated stenosis in the proximal RCA. This is a dominant vessel.  Left ventriculography: Not performed to conserve dye load.  PCI Note:  Following the diagnostic procedure, the decision was made to proceed with PCI of the left circumflex.  Weight-based bivalirudin was given for anticoagulation. Once a therapeutic ACT was achieved, a 6 [place] XB LAD 3.5 guide catheter was inserted.  A pro-water coronary guidewire was used to cross the lesion.  The lesion was predilated with a 2.5 mm compliant balloon.  With reperfusion the circumflex was noted to give rise to a single large branching marginal vessel. Given the severity of his coronary disease it was felt that this patient would require coronary bypass surgery this admission. For this reason we did not give him a P2Y12 inhibitor and decided to treat his circumflex disease with angioplasty only. We predilated the lesion with a 3.0 mm compliant balloon. Following PCI, there was less than 20% residual stenosis and TIMI-3 flow. Final angiography confirmed an excellent result. The patient tolerated the procedure well. At this point his chest pain was resolved. Patient was noted to be in atrial fibrillation throughout this procedure with a controlled ventricular response. There were no immediate procedural complications. A TR band was used for radial hemostasis. The patient was transferred to the ICU for further monitoring. We will continue with aspirin and transition  Angiomax to IV heparin. He will also be started on IV Integrilin.  PCI Data: Vessel - left circumflex/Segment -  proximal Percent Stenosis (pre)  100% TIMI-flow 0 PTCA only Percent Stenosis (post) less than 20% TIMI-flow (post) 3  Final Conclusions:   1. Severe three-vessel obstructive coronary disease. The culprit vessel was the proximal left circumflex. 2.  Elevated left ventricular filling pressures 3. Successful balloon angioplasty of the left circumflex.   Recommendations:  Patient be transferred to the intensive care unit. We'll continue aspirin, IV heparin, and IV Integrilin. CT surgery consult will be obtained. Obtain an Echocardiogram to assess LV function.   Assessment/Plan:  He has severe multivessel coronary occlusive disease presenting with an acute posterior lateral STEMI due to an occluded proximal left circumflex coronary artery. This has been opened with PTCA without  a stent in anticipation of coronary bypass graft surgery. His Integrilin was stopped this morning and he has remained hemodynamically stable without symptoms. I think we should proceed ahead with coronary bypass graft surgery this morning. I discussed the operative procedure with the patient and family including alternatives, benefits and risks; including but not limited to bleeding, blood transfusion, infection, stroke, myocardial infarction, graft failure, heart block requiring a permanent pacemaker, organ dysfunction, and death.  Marty Frater understands and agrees to proceed.  We will schedule surgery for this morning when a room is ready.  BARTLE,BRYAN K 09/01/2012, 8:13 AM

## 2012-09-01 NOTE — Brief Op Note (Signed)
08/31/2012 - 09/01/2012  12:28 PM  PATIENT:  [name]  57 y.o. male  PRE-OPERATIVE DIAGNOSIS: 1.STEMI 2.Multiple vessel Coronary Artery Disease  POST-OPERATIVE DIAGNOSIS:  1.STEMI 2.Multiple vessel Coronary Artery Disease  PROCEDURE:  INTRAOPERATIVE TRANSESOPHAGEAL ECHOCARDIOGRAM, CORONARY ARTERY BYPASS GRAFTING (CABG) x 3 (LIMA to LAD, SVG to OM, SVG to RCA)- using Left Internal Mammary Artery and Left Saphenous leg Vein Harvested Endoscopically   SURGEON:  Surgeon(s) and Role:    * Gaye Pollack, MD - Primary  PHYSICIAN ASSISTANT: Lars Pinks PA-C  ASSISTANTS: Ara Kussmaul RNFA   ANESTHESIA:   general  EBL:  Total I/O In: 62.5 [I.V.:62.5] Out: [id number] [Urine:775]   DRAINS:  Chest Tube(s) in the Mediastinal and pleural spaces   COUNTS CORRECT:  YES  DICTATION: .Dragon Dictation  PLAN OF CARE: Admit to inpatient   PATIENT DISPOSITION:  ICU - intubated and hemodynamically stable.   Delay start of Pharmacological VTE agent (>24hrs) due to surgical blood loss or risk of bleeding: yes  PRE OP WEIGHT: 95 kg

## 2012-09-01 NOTE — CV Procedure (Signed)
Intraoperative Transesophageal Echocardiography Report:  [name] is a 57 year old male with a history of hypertension, mixed hyperlipidemia, borderline diabetes and previous stroke who was admitted on 08/31/2012 with an acute STEMI due to an occluded proximal left circumflex coronary artery. He was also found to have a high-grade LAD and right coronary artery stenoses. The left circumflex coronary artery was opened by PTCA without stent. He is now brought to the operating room for coronary artery bypass grafting.  The patient was brought to the operating room at [facility] and general anesthesia was induced without difficulty. Following endotracheal intubation and oral gastric suctioning, the transesophageal echocardiography probe was inserted into the esophagus without difficulty.  Impression: Pre-bypass findings:  1. Aortic valve: The aortic valve was tri-leaflet. The leaflets  were without thickening or calcification and appeared to open normally. There was no aortic insufficiency.  2. Mitral valve: The mitral leaflets  opened normally. There was 1-2+ mitral insufficiency with a central jet of mitral regurgitation. There  were no prolapsing or flail segments noted.  3. Left ventricle: The left ventricular cavity was within normal limits of size. There was normal contractility noted in the anterior wall,  interventricular septum and inferoseptal areas. However there was severe hypokinesis to akinesis of the lateral wall. Left ventricular ejection fraction was estimated at 45%. There was no thrombus noted in the left ventricular apex. Left ventricular end-diastolic diameter measured 42.3 mm at the mid-papillary level at end diastole. There was mild left ventricular hypertrophy. The left ventricular posterior wall measures 11.5 mm in the anterior wall measured 11.0 mm at end diastole at the mid-papillary level.  4. Right ventricle: There appeared to be normal contractility the right  ventricular free wall and mild right ventricular enlargement.  5. Tricuspid valve: The tricuspid valve appeared structurally normal with trace to 1+ tricuspid insufficiency.  6. Interatrial septum: The interatrial septum was intact without evidence of patent foramen ovale or atrial septal defect by color Doppler or bubble study.  7. Left atrium: There was no evidence of thrombus in the left atrium or left atrial appendage  8. Ascending aorta: The ascending aorta showed a well-defined aortic root and sinotubular ridge.  There was no aneurysmal enlargement or effacement of the aortic root. There was some calcification noted in the area of the right sinus of Valsalva but no other atheromatous disease or calcification noted in the ascending aorta appeared  9. Descending aorta the descending aorta appeared free of atheromatous disease and measured 2.6 cm in diameter.  Post-bypass findings:  1. Aortic valve: The aortic valve was unchanged in the pre-bypass study. There was no evidence of aortic insufficiency.  2. Mitral valve: The mitral valve appeared unchanged in the pre-bypass study. There were no prolapsing segments. The leaflets opened normally. There was an 1-2+ mitral insufficiency with a central jet noted.  3. Left ventricle: The left ventricular function appeared unchanged from the pre-bypass study. There was normal contractility of the anterior wall, interventricular septum and inferior septum. The lateral wall again appeared severely hypokinetic to akinetic. Left ventricular ejection fraction was estimated at 45%.   4. Right ventricle: There appeared to be normal contractility of the right ventricular free wall and there mild right ventricular enlargement.  Roberts Gaudy, M.D.

## 2012-09-01 NOTE — Progress Notes (Signed)
  Echocardiogram Echocardiogram Transesophageal has been performed.  Ardelle Balls A 09/01/2012, 10:33 AM

## 2012-09-01 NOTE — Progress Notes (Signed)
09/01/12 1620  Vitals  Temp 97.5 F (36.4 C)  Pulse Rate 82  ECG Heart Rate 79  Resp 18  Oxygen Therapy  SpO2 ! 54 %  O2 Device Ventilator  FiO2 (%) 51.3 %  Art Line (2)  Arterial Line BP 2 74/51 mmHg  Arterial Line MAP (mmHg) 59 mmHg  Mechanical Ventilation  Vt (observed, mL) 271 mL  PIP Observed (cm H2O) 38 cm H2O  Pt agitated but not aware, jaw clenched, face turned dusky, lund sounds wheezing.  SB in 40's, pacer turned on A paced at 80, Vt received In 300s (half of set volume). Dr. Cleone Slim at bedside, Dr. Cyndia Bent paged. Precedex turned up to 0.7 (from 0.5), 2 mg Morphine given, 5 mg albuterol breathing tx given. Pt no longer agitated, color pink, SpO2 94% at 100% ventilator, received Vt improved. Will continue to monitor.  Dr. Cyndia Bent to bedside @ 16:50. 60 mg solu-medrol ordered and given. 2 mg versed given. Pt VS stable will continue to monitor.

## 2012-09-01 NOTE — Progress Notes (Signed)
Upon receiving report this morning, Dr. Cyndia Bent was in pt's room regarding emergent open heart surgery.  Orders received; pt prepped, consents signed; heparin gtt turned off on call to OR; pt taken to OR immediately; family members took home pt's belongings last night; emotional support given to pt & to family

## 2012-09-01 NOTE — Progress Notes (Signed)
Respiratory therapy note-increased fio2 due to severe bronchospasm. sp02 decreased and 5.0 mg albuterol given. Dr. Linna Caprice at bedside and Dr. Cyndia Bent. Patient's BBS improved but still with expiratory wheezes. Cont to monitor.

## 2012-09-01 NOTE — Progress Notes (Signed)
S/p urgent CABG x 3 after presenting with STEMI last night  BP 108/76  Pulse 84  Temp(Src) 99 F (37.2 C) (Oral)  Resp 16  Ht 5\' 8"  (1.727 m)  Wt 210 lb (95.255 kg)  BMI 31.94 kg/m2  SpO2 99%   Intake/Output Summary (Last 24 hours) at 09/01/12 1802 Last data filed at 09/01/12 1800  Gross per 24 hour  Intake 5561.11 ml  Output   3570 ml  Net 1991.11 ml    CI 2.2 on dopamine @ 4 mcg/kg min  Had episode of severe bronchospasm on arrival to SICU- no wheezing currently  Continue current care

## 2012-09-01 NOTE — Preoperative (Addendum)
Beta Blockers   Lo[pressor 5 mgs IV given @ 04:22 today.

## 2012-09-01 NOTE — Anesthesia Procedure Notes (Signed)
Procedure Name: Intubation Date/Time: 09/01/2012 9:43 AM Performed by: Wanita Chamberlain Pre-anesthesia Checklist: Patient identified, Timeout performed, Emergency Drugs available, Suction available and Patient being monitored Patient Re-evaluated:Patient Re-evaluated prior to inductionOxygen Delivery Method: Circle system utilized Preoxygenation: Pre-oxygenation with 100% oxygen Intubation Type: IV induction, Rapid sequence and Cricoid Pressure applied Tube type: Oral Tube size: 8.0 mm Number of attempts: 1 Airway Equipment and Method: Video-laryngoscopy Placement Confirmation: ETT inserted through vocal cords under direct vision,  positive ETCO2 and breath sounds checked- equal and bilateral Secured at: 23 cm Tube secured with: Tape Dental Injury: Teeth and Oropharynx as per pre-operative assessment  Difficulty Due To: Difficult Airway- due to anterior larynx, Difficult Airway- due to dentition and Difficulty was anticipated Future Recommendations: Recommend- induction with short-acting agent, and alternative techniques readily available Comments: TM distance < 3   Pt with full beard.  Vomited @ 5:30 am today.

## 2012-09-01 NOTE — OR Nursing (Signed)
Condom cath removed at 0950 without difficulty. Dr Cyndia Bent scrubbed in at 55

## 2012-09-01 NOTE — Progress Notes (Signed)
Lopressor given per MD order. Pt VSS. Will continue to monitor.

## 2012-09-01 NOTE — OR Nursing (Signed)
First call to SICU at 1304.  Second call to SICU at 1333.

## 2012-09-01 NOTE — Progress Notes (Signed)
[facility]  DAILY PROGRESS NOTE   Subjective:  Feels much better, angina free, nausea resolved. Now NSR. Atrial fibrillation resolved. Several runs of NSVT overnight. One episode of asystole, almost 5 seconds a couple of hours ago.  Objective:  Temp:  [97.8 F (36.6 C)-98.6 F (37 C)] 98.6 F (37 C) (02/18 0400) Pulse Rate:  [62-137] 99 (02/18 0500) Resp:  [10-21] 13 (02/18 0500) BP: (92-140)/(69-107) 109/81 mmHg (02/18 0500) SpO2:  [93 %-100 %] 95 % (02/18 0500) FiO2 (%):  [50 %] 50 % (02/17 2300) Weight:  [95.255 kg (210 lb)] 95.255 kg (210 lb) (02/17 2056) Weight change:   Intake/Output from previous day: 02/17 0701 - 02/18 0700 In: 669.1 [I.V.:419.1; IV Piggyback:250] Out: 575 [Urine:200; Emesis/NG output:375]  Intake/Output from this shift: Total I/O In: -  Out: 575 [Urine:575]  Medications: Current Facility-Administered Medications  Medication Dose Route Frequency Provider Last Rate Last Dose  . 0.9 %  sodium chloride infusion  1,000 mL Intravenous Continuous Kathalene Frames, MD 20 mL/hr at 08/31/12 1929 1,000 mL at 08/31/12 1929  . 0.9 %  sodium chloride infusion   Intravenous Continuous Martie Lee, PA 20 mL/hr at 08/31/12 2045    . 0.9 %  sodium chloride infusion  250 mL Intravenous PRN Alexander Aument, MD      . acetaminophen (TYLENOL) tablet 650 mg  650 mg Oral Q4H PRN Deonta Bomberger, MD      . aminocaproic acid (AMICAR) 10 g in sodium chloride 0.9 % 100 mL infusion   Intravenous To OR Nita Denyse Dago, PHARMD      . bisacodyl (DULCOLAX) EC tablet 5 mg  5 mg Oral Once Gaye Pollack, MD      . cefUROXime (ZINACEF) 1.5 g in dextrose 5 % 50 mL IVPB  1.5 g Intravenous To OR Nita Denyse Dago, PHARMD      . cefUROXime (ZINACEF) 750 mg in dextrose 5 % 50 mL IVPB  750 mg Intravenous To OR Nita Denyse Dago, PHARMD      . dexmedetomidine (PRECEDEX) 400 MCG/100ML infusion  0.1-0.7 mcg/kg/hr Intravenous To OR Nita Denyse Dago, PHARMD       . DOPamine (INTROPIN) 800 mg in dextrose 5 % 250 mL infusion  2-20 mcg/kg/min Intravenous To OR Nita Denyse Dago, PHARMD      . EPINEPHrine (ADRENALIN) 4,000 mcg in dextrose 5 % 250 mL infusion  0.5-20 mcg/min Intravenous To OR Nita Denyse Dago, PHARMD      . heparin 5000 UNIT/ML injection           . insulin regular (NOVOLIN R,HUMULIN R) 1 Units/mL in sodium chloride 0.9 % 100 mL infusion   Intravenous To OR Nita Denyse Dago, PHARMD      . magnesium sulfate (IV Push/IM) injection 40 mEq  40 mEq Other To OR Nita Denyse Dago, PHARMD      . [facility] HOLD] metoprolol (LOPRESSOR) injection 2.5 mg  2.5 mg Intravenous Q6H Erlene Quan, PA   2.5 mg at 09/01/12 0135  . metoprolol tartrate (LOPRESSOR) tablet 12.5 mg  12.5 mg Oral Once Gaye Pollack, MD      . [facility] HOLD] nitroGLYCERIN (NITROSTAT) SL tablet 0.4 mg  0.4 mg Sublingual Q5 min PRN Babette Relic, MD   0.4 mg at 08/31/12 2044  . [facility] HOLD] nitroGLYCERIN 0.2 mg/mL in dextrose 5 % infusion  5 mcg/min Intravenous Titrated Hafiz Irion, MD 1.5 mL/hr at 08/31/12 2245 5 mcg/min at  08/31/12 2245  . nitroGLYCERIN 0.2 mg/mL in dextrose 5 % infusion  2-200 mcg/min Intravenous To OR Nita Denyse Dago, PHARMD      . nitroglycerin-verapamil-HEPARIN-sodium bicarbonate irrigation for artery spasm   Irrigation To OR Nita Denyse Dago, PHARMD      . ondansetron [facility]) 4 MG/2ML injection           . ondansetron (ZOFRAN) injection 4 mg  4 mg Intravenous Q6H PRN Sanda Klein, MD   4 mg at 09/01/12 0213  . [facility] HOLD] pantoprazole (PROTONIX) EC tablet 40 mg  40 mg Oral Q0600 Erlene Quan, [place]      . phenylephrine (NEO-SYNEPHRINE) 20,000 mcg in dextrose 5 % 250 mL infusion  30-200 mcg/min Intravenous To OR Nita Denyse Dago, PHARMD      . potassium chloride injection 80 mEq  80 mEq Other To OR [address], PHARMD      . [facility] HOLD] promethazine (PHENERGAN) injection 12.5 mg  12.5 mg Intravenous Q6H PRN Erlene Quan, PA   12.5 mg at 09/01/12 0446   . sodium chloride 0.9 % injection 3 mL  3 mL Intravenous Q12H Mariyana Fulop, MD   3 mL at 09/01/12 0600  . sodium chloride 0.9 % injection 3 mL  3 mL Intravenous PRN Edyth Glomb, MD      . vancomycin (VANCOCIN) 1,250 mg in sodium chloride 0.9 % 250 mL IVPB  1,250 mg Intravenous To OR Tomasita Morrow, PHARMD        Physical Exam: General appearance: alert, cooperative and no distress Neck: no adenopathy, no carotid bruit, no JVD, supple, symmetrical, trachea midline and thyroid not enlarged, symmetric, no tenderness/mass/nodules Lungs: clear to auscultation bilaterally Heart: regular rate and rhythm, S1, S2 normal, no murmur, click, rub or gallop Abdomen: soft, non-tender; bowel sounds normal; no masses,  no organomegaly Skin: Skin color, texture, turgor normal. No rashes or lesions Neurologic: Alert and oriented X 3, normal strength and tone. Normal symmetric reflexes. Normal coordination and gait  Lab Results: Results for orders placed during the hospital encounter of 08/31/12 (from the past 48 hour(s))  CBC     Status: Abnormal   Collection Time    08/31/12  7:23 PM      Result Value Range   WBC 14.3 (*) 4.0 - 10.5 K/uL   RBC 4.63  4.22 - 5.81 MIL/uL   Hemoglobin 13.8  13.0 - 17.0 g/dL   HCT 40.7  39.0 - 52.0 %   MCV 87.9  78.0 - 100.0 fL   MCH 29.8  26.0 - 34.0 pg   MCHC 33.9  30.0 - 36.0 g/dL   RDW 12.2  11.5 - 15.5 %   Platelets 408 (*) 150 - 400 K/uL  PROTIME-INR     Status: None   Collection Time    08/31/12  7:23 PM      Result Value Range   Prothrombin Time 13.5  11.6 - 15.2 seconds   INR 1.04  0.00 - 1.49  APTT     Status: None   Collection Time    08/31/12  7:23 PM      Result Value Range   aPTT 28  24 - 37 seconds  POCT I-STAT TROPONIN I     Status: None   Collection Time    08/31/12  7:44 PM      Result Value Range   Troponin i, poc 0.02  0.00 - 0.08 ng/mL   Comment 3  Comment: Due to the release kinetics of cTnI,     a negative result  within the first hours     of the onset of symptoms does not rule out     myocardial infarction with certainty.     If myocardial infarction is still suspected,     repeat the test at appropriate intervals.  POCT I-STAT, CHEM 8     Status: Abnormal   Collection Time    08/31/12  7:46 PM      Result Value Range   Sodium 141  135 - 145 mEq/L   Potassium 3.5  3.5 - 5.1 mEq/L   Chloride 105  96 - 112 mEq/L   BUN 23  6 - 23 mg/dL   Creatinine, Ser 1.50 (*) 0.50 - 1.35 mg/dL   Glucose, Bld 132 (*) 70 - 99 mg/dL   Calcium, Ion 1.09 (*) 1.12 - 1.23 mmol/L   TCO2 24  0 - 100 mmol/L   Hemoglobin 14.6  13.0 - 17.0 g/dL   HCT 43.0  39.0 - 52.0 %  APTT     Status: None   Collection Time    08/31/12  8:38 PM      Result Value Range   aPTT 28  24 - 37 seconds  CBC     Status: Abnormal   Collection Time    08/31/12  8:38 PM      Result Value Range   WBC 18.6 (*) 4.0 - 10.5 K/uL   RBC 4.72  4.22 - 5.81 MIL/uL   Hemoglobin 14.6  13.0 - 17.0 g/dL   HCT 41.6  39.0 - 52.0 %   MCV 88.1  78.0 - 100.0 fL   MCH 30.9  26.0 - 34.0 pg   MCHC 35.1  30.0 - 36.0 g/dL   RDW 12.1  11.5 - 15.5 %   Platelets 421 (*) 150 - 400 K/uL  COMPREHENSIVE METABOLIC PANEL     Status: Abnormal   Collection Time    08/31/12  8:38 PM      Result Value Range   Sodium 140  135 - 145 mEq/L   Potassium 3.6  3.5 - 5.1 mEq/L   Chloride 103  96 - 112 mEq/L   CO2 24  19 - 32 mEq/L   Glucose, Bld 129 (*) 70 - 99 mg/dL   BUN 25 (*) 6 - 23 mg/dL   Creatinine, Ser 1.39 (*) 0.50 - 1.35 mg/dL   Calcium 8.9  8.4 - 10.5 mg/dL   Total Protein 7.5  6.0 - 8.3 g/dL   Albumin 3.8  3.5 - 5.2 g/dL   AST 34  0 - 37 U/L   ALT 29  0 - 53 U/L   Alkaline Phosphatase 65  39 - 117 U/L   Total Bilirubin 0.3  0.3 - 1.2 mg/dL   GFR calc non Af Amer 54 (*) >90 mL/min   GFR calc Af Amer 63 (*) >90 mL/min   Comment:            The eGFR has been calculated     using the CKD EPI equation.     This calculation has not been     validated in  all clinical     situations.     eGFR's persistently     <90 mL/min signify     possible Chronic Kidney Disease.  PROTIME-INR     Status: None   Collection Time    08/31/12  8:38 PM  Result Value Range   Prothrombin Time 13.0  11.6 - 15.2 seconds   INR 0.99  0.00 - 1.49  POCT I-STAT TROPONIN I     Status: Abnormal   Collection Time    08/31/12  8:52 PM      Result Value Range   Troponin i, poc 0.24 (*) 0.00 - 0.08 ng/mL   Comment NOTIFIED PHYSICIAN     Comment 3            Comment: Due to the release kinetics of cTnI,     a negative result within the first hours     of the onset of symptoms does not rule out     myocardial infarction with certainty.     If myocardial infarction is still suspected,     repeat the test at appropriate intervals.  POCT I-STAT, CHEM 8     Status: Abnormal   Collection Time    08/31/12  8:54 PM      Result Value Range   Sodium 141  135 - 145 mEq/L   Potassium 3.6  3.5 - 5.1 mEq/L   Chloride 106  96 - 112 mEq/L   BUN 24 (*) 6 - 23 mg/dL   Creatinine, Ser 1.40 (*) 0.50 - 1.35 mg/dL   Glucose, Bld 129 (*) 70 - 99 mg/dL   Calcium, Ion 1.10 (*) 1.12 - 1.23 mmol/L   TCO2 25  0 - 100 mmol/L   Hemoglobin 14.6  13.0 - 17.0 g/dL   HCT 43.0  39.0 - 52.0 %  MRSA PCR SCREENING     Status: None   Collection Time    08/31/12 10:32 PM      Result Value Range   MRSA by PCR NEGATIVE  NEGATIVE   Comment:            The GeneXpert MRSA Assay (FDA     approved for NASAL specimens     only), is one component of a     comprehensive MRSA colonization     surveillance program. It is not     intended to diagnose MRSA     infection nor to guide or     monitor treatment for     MRSA infections.  LIPID PANEL     Status: Abnormal   Collection Time    08/31/12 11:38 PM      Result Value Range   Cholesterol 209 (*) 0 - 200 mg/dL   Triglycerides 214 (*) <150 mg/dL   HDL 30 (*) >39 mg/dL   Total CHOL/HDL Ratio 7.0     VLDL 43 (*) 0 - 40 mg/dL   LDL  Cholesterol 136 (*) 0 - 99 mg/dL   Comment:            Total Cholesterol/HDL:CHD Risk     Coronary Heart Disease Risk Table                         Men   Women      1/2 Average Risk   3.4   3.3      Average Risk       5.0   4.4      2 X Average Risk   9.6   7.1      3 X Average Risk  23.4   11.0                Use the calculated Patient Ratio  above and the CHD Risk Table     to determine the patient's CHD Risk.                ATP III CLASSIFICATION (LDL):      <100     mg/dL   Optimal      100-129  mg/dL   Near or Above                        Optimal      130-159  mg/dL   Borderline      160-189  mg/dL   High      >190     mg/dL   Very High  TYPE AND SCREEN     Status: None   Collection Time    08/31/12 11:38 PM      Result Value Range   ABO/RH(D) O POS     Antibody Screen NEG     Sample Expiration 09/03/2012    ABO/RH     Status: None   Collection Time    08/31/12 11:45 PM      Result Value Range   ABO/RH(D) O POS    MAGNESIUM     Status: None   Collection Time    09/01/12  3:00 AM      Result Value Range   Magnesium 1.9  1.5 - 2.5 mg/dL  BASIC METABOLIC PANEL     Status: Abnormal   Collection Time    09/01/12  3:00 AM      Result Value Range   Sodium 140  135 - 145 mEq/L   Potassium 4.3  3.5 - 5.1 mEq/L   Chloride 103  96 - 112 mEq/L   CO2 23  19 - 32 mEq/L   Glucose, Bld 148 (*) 70 - 99 mg/dL   BUN 25 (*) 6 - 23 mg/dL   Creatinine, Ser 1.37 (*) 0.50 - 1.35 mg/dL   Calcium 8.6  8.4 - 10.5 mg/dL   GFR calc non Af Amer 55 (*) >90 mL/min   GFR calc Af Amer 64 (*) >90 mL/min   Comment:            The eGFR has been calculated     using the CKD EPI equation.     This calculation has not been     validated in all clinical     situations.     eGFR's persistently     <90 mL/min signify     possible Chronic Kidney Disease.  CBC     Status: Abnormal   Collection Time    09/01/12  3:00 AM      Result Value Range   WBC 16.7 (*) 4.0 - 10.5 K/uL   RBC 4.53   4.22 - 5.81 MIL/uL   Hemoglobin 14.3  13.0 - 17.0 g/dL   HCT 40.8  39.0 - 52.0 %   MCV 90.1  78.0 - 100.0 fL   MCH 31.6  26.0 - 34.0 pg   MCHC 35.0  30.0 - 36.0 g/dL   RDW 12.4  11.5 - 15.5 %   Platelets 430 (*) 150 - 400 K/uL  TROPONIN I     Status: Abnormal   Collection Time    09/01/12  3:00 AM      Result Value Range   Troponin I >20.00 (*) <0.30 ng/mL   Comment:            Due to the release kinetics  of cTnI,     a negative result within the first hours     of the onset of symptoms does not rule out     myocardial infarction with certainty.     If myocardial infarction is still suspected,     repeat the test at appropriate intervals.     CRITICAL RESULT CALLED TO, READ BACK BY AND VERIFIED WITH:     ROBERTS,J RN 09/01/2012 0436 JORDANS  GLUCOSE, CAPILLARY     Status: Abnormal   Collection Time    09/01/12  8:27 AM      Result Value Range   Glucose-Capillary 119 (*) 70 - 99 mg/dL    Imaging: Dg Chest Port 1 View  08/31/2012  *RADIOLOGY REPORT*  Clinical Data: Chest pain.  Code stemi.  PORTABLE CHEST - 1 VIEW  Comparison: None.  Findings: 2104 hours.  There is lordotic positioning and suboptimal inspiration.  The heart size and mediastinal contours are normal. There is mild atelectasis at both lung bases.  No edema, confluent airspace opacity or significant pleural effusion is seen. Telemetry leads overlie the chest.  IMPRESSION: Mild bibasilar atelectasis.  No confluent airspace opacity or edema.   Original Report Authenticated By: Richardean Sale, M.D.     Assessment:  1. Active Problems: 2.   Hypertension 3.   Borderline diabetic 4.   STEMI (ST elevation myocardial infarction) 5. CHF, acute , presumably diastolic (echo pending) 6. Multivessel CAD 7. Paroxysmal atrial fibrillation 8. Nonsustained VT 9. Acute renal insufficiency - creatinine is actually markedly better this AM. 10. "Borderline" DM 11. Mixed dyslipidemia 12. History of ischemic  stroke  Plan:  1. CABG today. Will probably need IV amiodarone perioperatively. TEE intraop will allow EF evaluation. LDL and TG still high despite home pharmacological therapy.  Time Spent Directly with Patient:  35inutes  Length of Stay:  LOS: 1 day    Adrienne Delay 09/01/2012, 8:42 AM

## 2012-09-01 NOTE — Progress Notes (Signed)
Seen early post op (LIMA to LAD, SVg to RCA, SVG to OM) - he appears hemodynamically well compensated, in NSR on relatively low doses of pressors. Good PA pressure. Still sedated/intubated. High risk of recurrent AF, low threshold to resume IV amiodarone. Sanda Klein, MD, [place] [phone] office [phone] pager

## 2012-09-01 NOTE — Op Note (Signed)
[name], [name]              ACCOUNT NO.:  [id number]  MEDICAL RECORD NO.:  [id number]  LOCATION:  2309                         FACILITY:  [facility]  PHYSICIAN:  Gilford Raid, M.D.     DATE OF BIRTH:  [birth year]  DATE OF PROCEDURE:  09/01/2012 DATE OF DISCHARGE:                              OPERATIVE REPORT   PREOPERATIVE DIAGNOSIS:  Severe multivessel coronary occlusive disease status post acute posterolateral ST-segment elevation myocardial infarction.  POSTOPERATIVE DIAGNOSIS:  Severe multivessel coronary occlusive disease status post acute posterolateral ST-segment elevation myocardial infarction.  OPERATIVE PROCEDURE:  Median sternotomy, extracorporeal circulation, coronary artery bypass graft surgery x3 using a left internal mammary artery graft to left anterior descending coronary artery, with a saphenous vein graft to the obtuse marginal branch of the left circumflex coronary artery, and a saphenous vein graft to the right coronary artery.  Endoscopic vein harvesting from the left leg.  ATTENDING SURGEON:  Gilford Raid, MD  ASSISTANT:  Lars Pinks, PA  ANESTHESIA:  General endotracheal.  CLINICAL HISTORY:  This patient is a 57 year old gentleman who presented last night with an acute posterolateral ST-segment elevation MI.  He was taken to the cath lab where there was found to be an occluded proximal left circumflex coronary artery.  This was opened with PTCA with a good result and supplied a large branching marginal artery.  The patient also had high-grade mid LAD stenosis 80-90% and a 95-99% proximal right coronary artery stenosis.  The patient did not have a stent placed since he had 3 vessel disease and was felt to be a surgical candidate.  He was started on Integrilin and transition from Angiomax to heparin postoperatively.  He remained stable overnight without further chest pain, did have several episodes of nausea and vomiting as well as  some nonsustained ventricular tachycardia.  He remained hemodynamically stable.  I saw him this morning and felt that proceeding ahead with coronary artery bypass surgery is the best treatment and while he was doing well.  I discussed the operative procedure with the patient and his family including alternatives, benefits, and risks including, but not limited to bleeding, blood transfusion, infection, stroke, myocardial infarction, graft failure, and death.  He understood all this and agreed to proceed.  OPERATIVE PROCEDURE:  The patient was taken to the operating room and placed on the table in supine position.  After induction of general endotracheal anesthesia, a Foley catheter was placed in bladder using sterile technique.  Then, the chest, abdomen, and both lower extremities were prepped and draped in usual sterile manner.  A TEE was performed by Dr. Roberts Gaudy and showed akinesis of the posterolateral wall.  There was mild mitral regurgitation.  Overall, left ventricular function was good.  The right heart function was normal.  There was no aortic stenosis or insufficiency.  Then, the chest was opened through a median sternotomy incision.  The pericardium opened in midline.  Examination of the heart showed good right ventricular contractility.  The ascending aorta had no palpable plaques in it.  Then, the left internal mammary artery was harvested from chest wall to pedicle graft.  This was a medium caliber vessel with excellent blood flow through  it.  I considered using a right internal mammary graft, except this was a smaller vessel and branch early and I did not feel it would be long enough to reach the right coronary artery beyond the area of disease without using as a free graft.  Therefore, I decided not to use the right mammary artery.  We harvested a segment of greater saphenous vein from the left leg using endoscopic vein harvest technique.  This vein was of medium  size and good quality.  We initially examined the saphenous vein in the right leg through a small incision next to the knee and this vein was felt to be suboptimal size and it was not harvested.  Then, the patient was heparinized when adequate ACT was obtained.  The distal ascending aorta was cannulated using a 20-French aortic cannula for arterial inflow.  Venous outflow was achieved using a two-stage venous cannula for the right atrial appendage.  An antegrade cardioplegia and vent cannula were inserted in the aortic root.  The patient was placed on cardiopulmonary bypass and distal coronary was identified.  The LAD was relatively small vessel distally beyond the midvessel stenosis, but had no distal disease in it.  The obtuse marginal was a large branching vessel, it was located in its midportion where there was minimal disease.  It then became intramyocardial and all the subbranches were intramyocardial.  The right coronary artery was diffusely diseased throughout its proximal and midportion.  Further down beyond the acute margin the vessel looked better and there was an area just before the takeoff of the posterior descending branch that was fairly free of disease and felt to be graftable.  Then, the aorta was crossclamped and 1000 mL of cold blood antegrade cardioplegia was administered in the aortic root with quick arrest of the heart.  Systemic hypothermia to 32 degrees centigrade and topical hypothermia with iced saline was used.  Temperature probe was placed in septum insulin-dependent pericardium.  First distal anastomosis was performed to the right coronary artery. The internal diameter was about 2.5 mm.  The conduit used was a segment of greater saphenous vein and anastomosis performed end-to-side manner using continuous 7-0 Prolene suture.  Flow was noted through the graft and was excellent.  The second distal anastomosis was performed to the obtuse marginal branch.   The internal diameter was about 2 mm.  The conduit used, was a second segment of greater saphenous vein and anastomosis performed end- to-side manner using continuous 7-0 Prolene suture.  Flow was noted through the graft and was excellent.  Then, another dose of cardioplegia was given down the vein grafts and aortic root.  The third distal anastomosis was performed to distal LAD.  The internal diameter was about 1.6 mm.  The conduit used was the left internal mammary graft, was brought through an opening left pericardium anterior phrenic nerve.  This anastomosed to LAD in end-to-side manner using continuous 8-0 Prolene suture.  The pedicle was sutured to the epicardium with 6-0 Prolene sutures.  Then, another dose of cardioplegia was given.  With the crossclamp in place, the 2 proximal vein graft anastomoses were performed to the mid ascending aorta in an end-to-side manner using continuous 6-0 Prolene suture.  Then, the clamp was removed from the mammary pedicle.  There was rapid warming of the ventricular septum and return of spontaneous ventricular fibrillation.  The crossclamp was removed with a time of 58 minutes and the patient spontaneously converted to sinus rhythm.  The proximal and  distal anastomoses were hemostatic.  The graft markers placed around the proximal anastomoses.  Two temporary right ventricular and right atrial pacing wires placed and brought through the skin.  When the patient rewarmed to 37 degrees centigrade, he was weaned from cardiopulmonary bypass on dopamine at 4 mcg/kg per minute.  Cardiac function appeared good.  A TEE showed slight improvement in posterolateral wall movement.  Overall, left ventricular function was good.  Cardiac output was 5 liters/minute.  There was no change in the mild mitral regurgitation.  Then, protamine was given and aortic cannulas were removed without difficulty.  Hemostasis was achieved. Three chest tubes were placed, a tube  in the posterior pericardium, one in left pleural space, and one in anterior mediastinum.  The sternum was then reapproximated with double #6 stainless steel wires.  The fascia was closed with continuous #1 Vicryl suture.  Subcutaneous tissue was closed with continuous 2-0 Vicryl and the skin with a 3-0 Vicryl subcuticular closure.  The lower extremity vein harvest site was closed in layers in similar manner.  The sponge, needle, and instrument counts were correct according to the scrub nurse.  Dry sterile dressing was applied over the incisions around the chest tubes, which were hooked to Pleur-Evac suction.  The patient remained hemodynamically stable, and was transported to the SICU in guarded but stable condition.     Gilford Raid, M.D.     BB/MEDQ  D:  09/01/2012  T:  09/01/2012  Job:  [id number]  cc:   Sanda Klein, MD

## 2012-09-01 NOTE — Progress Notes (Signed)
Respiratory therapy note- fio2 decreased to 60%. BBS wheezing now greater on left.

## 2012-09-01 NOTE — Transfer of Care (Signed)
Immediate Anesthesia Transfer of Care Note  Patient: [name]  Procedure(s) Performed: Procedure(s) with comments: CORONARY ARTERY BYPASS GRAFTING (CABG) (N/A) - Coronary Artery Bypass Grafting Times Three Using Left Internal Mammary Artery and Left Saphenous leg Vein Harvested Endoscopically INTRAOPERATIVE TRANSESOPHAGEAL ECHOCARDIOGRAM (N/A)  Patient Location: PACU and SICU  Anesthesia Type:General  Level of Consciousness: unresponsive  Airway & Oxygen Therapy: Patient remains intubated per anesthesia plan and Patient placed on Ventilator (see vital sign flow sheet for setting)  Post-op Assessment: Post -op Vital signs reviewed and stable  Post vital signs: Reviewed and stable  Complications: No apparent anesthesia complications

## 2012-09-01 NOTE — Progress Notes (Signed)
[facility] paged concerning pt run of vtach. Will continue to monitor. VSS

## 2012-09-01 NOTE — Anesthesia Preprocedure Evaluation (Addendum)
Anesthesia Evaluation  Patient identified by MRN, date of birth, ID band Patient awake    Reviewed: Allergy & Precautions, H&P , NPO status , Patient's Chart, lab work & pertinent test results  Airway Mallampati: III TM Distance: <3 FB Neck ROM: Full    Dental  (+) Dental Advisory Given, Missing and Poor Dentition,    Pulmonary neg pulmonary ROS,          Cardiovascular hypertension, Pt. on medications + CAD and + Past MI  01-Sep-2012 [facility] ROUTINE RECORD Sinus rhythm with Premature supraventricular complexes Posterior infarct , age undetermined Abnormal ECG   Neuro/Psych *RADIOLOGY REPORT* 01-06-2011   Clinical Data: Dizziness, weakness.  Hit left side of head 1 week ago.   CT HEAD WITHOUT CONTRAST   Technique:  Contiguous axial images were obtained from the base of the skull through the vertex without contrast.   Comparison: None.   Findings: There is atrophy, slightly advanced for patient's age. Areas of mild chronic small vessel disease within the deep white matter. No acute intracranial abnormality.  Specifically, no hemorrhage, hydrocephalus, mass lesion, acute infarction, or significant intracranial injury.  No acute calvarial abnormality. Visualized paranasal sinuses and mastoids clear.  Orbital soft tissues unremarkable.   Neuromuscular disease    GI/Hepatic Neg liver ROS, hiatal hernia, Vomited @ 5:30 am today   Endo/Other  negative endocrine ROS  Renal/GU negative Renal ROS     Musculoskeletal  (+) Arthritis -, Osteoarthritis,    Abdominal   Peds  Hematology negative hematology ROS (+)   Anesthesia Other Findings Teeth in poor repair  Reproductive/Obstetrics negative OB ROS                      Anesthesia Physical Anesthesia Plan  ASA: III  Anesthesia Plan: General   Post-op Pain Management:    Induction: Intravenous  Airway  Management Planned: Oral ETT  Additional Equipment: TEE, Arterial line, CVP, Ultrasound Guidance Line Placement and PA Cath  Intra-op Plan:   Post-operative Plan:   Informed Consent: I have reviewed the patients History and Physical, chart, labs and discussed the procedure including the risks, benefits and alternatives for the proposed anesthesia with the patient or authorized representative who has indicated his/her understanding and acceptance.   Dental advisory given  Plan Discussed with: CRNA and Anesthesiologist  Anesthesia Plan Comments:         Anesthesia Quick Evaluation

## 2012-09-02 ENCOUNTER — Inpatient Hospital Stay (HOSPITAL_COMMUNITY): Payer: BC Managed Care – PPO

## 2012-09-02 DIAGNOSIS — Z951 Presence of aortocoronary bypass graft: Secondary | ICD-10-CM

## 2012-09-02 LAB — GLUCOSE, CAPILLARY
Glucose-Capillary: 124 mg/dL — ABNORMAL HIGH (ref 70–99)
Glucose-Capillary: 116 mg/dL — ABNORMAL HIGH (ref 70–99)
Glucose-Capillary: 115 mg/dL — ABNORMAL HIGH (ref 70–99)
Glucose-Capillary: 132 mg/dL — ABNORMAL HIGH (ref 70–99)
Glucose-Capillary: 139 mg/dL — ABNORMAL HIGH (ref 70–99)
Glucose-Capillary: 123 mg/dL — ABNORMAL HIGH (ref 70–99)
Glucose-Capillary: 125 mg/dL — ABNORMAL HIGH (ref 70–99)
Glucose-Capillary: 118 mg/dL — ABNORMAL HIGH (ref 70–99)
Glucose-Capillary: 108 mg/dL — ABNORMAL HIGH (ref 70–99)
Glucose-Capillary: 104 mg/dL — ABNORMAL HIGH (ref 70–99)
Glucose-Capillary: 114 mg/dL — ABNORMAL HIGH (ref 70–99)
Glucose-Capillary: 116 mg/dL — ABNORMAL HIGH (ref 70–99)
Glucose-Capillary: 121 mg/dL — ABNORMAL HIGH (ref 70–99)
Glucose-Capillary: 123 mg/dL — ABNORMAL HIGH (ref 70–99)

## 2012-09-02 LAB — BASIC METABOLIC PANEL
BUN: 18 mg/dL (ref 6–23)
Chloride: 106 mEq/L (ref 96–112)
GFR calc Af Amer: 74 mL/min — ABNORMAL LOW (ref >90)
Potassium: 3.7 mEq/L (ref 3.5–5.1)

## 2012-09-02 LAB — POCT I-STAT 3, ART BLOOD GAS (G3+)
Acid-base deficit: 1 mmol/L (ref 0.0–2.0)
Bicarbonate: 23.6 mEq/L (ref 20.0–24.0)
Patient temperature: 38.1
Patient temperature: 38.2
TCO2: 26 mmol/L (ref 0–100)
pCO2 arterial: 45.6 mmHg — ABNORMAL HIGH (ref 35.0–45.0)
pH, Arterial: 7.355 (ref 7.350–7.450)
pH, Arterial: 7.356 (ref 7.350–7.450)
pH, Arterial: 7.365 (ref 7.350–7.450)
pO2, Arterial: 67 mmHg — ABNORMAL LOW (ref 80.0–100.0)

## 2012-09-02 LAB — CBC
HCT: 35.2 % — ABNORMAL LOW (ref 39.0–52.0)
MCH: 31.4 pg (ref 26.0–34.0)
Platelets: 309 10*3/uL (ref 150–400)
Platelets: 335 10*3/uL (ref 150–400)
RBC: 3.93 MIL/uL — ABNORMAL LOW (ref 4.22–5.81)
RBC: 4.2 MIL/uL — ABNORMAL LOW (ref 4.22–5.81)
RDW: 12.4 % (ref 11.5–15.5)
WBC: 18.9 10*3/uL — ABNORMAL HIGH (ref 4.0–10.5)
WBC: 20 10*3/uL — ABNORMAL HIGH (ref 4.0–10.5)

## 2012-09-02 LAB — CREATININE, SERUM
Creatinine, Ser: 1.35 mg/dL (ref 0.50–1.35)
GFR calc Af Amer: 65 mL/min — ABNORMAL LOW (ref >90)

## 2012-09-02 LAB — MAGNESIUM: Magnesium: 2.5 mg/dL (ref 1.5–2.5)

## 2012-09-02 LAB — POCT I-STAT, CHEM 8
Chloride: 103 mEq/L (ref 96–112)
HCT: 40 % (ref 39.0–52.0)
Potassium: 4.2 mEq/L (ref 3.5–5.1)

## 2012-09-02 MED ORDER — POTASSIUM CHLORIDE CRYS ER 20 MEQ PO TBCR
40.0000 meq | EXTENDED_RELEASE_TABLET | Freq: Two times a day (BID) | ORAL | Status: AC
  Administered 2012-09-02 (×2): 40 meq via ORAL
  Filled 2012-09-02 (×2): qty 2

## 2012-09-02 MED ORDER — INSULIN ASPART 100 UNIT/ML ~~LOC~~ SOLN
0.0000 [IU] | SUBCUTANEOUS | Status: DC
  Administered 2012-09-02 – 2012-09-03 (×3): 2 [IU] via SUBCUTANEOUS

## 2012-09-02 MED ORDER — POTASSIUM CHLORIDE 10 MEQ/50ML IV SOLN
10.0000 meq | INTRAVENOUS | Status: AC
  Administered 2012-09-02 (×3): 10 meq via INTRAVENOUS

## 2012-09-02 MED ORDER — ENOXAPARIN SODIUM 30 MG/0.3ML ~~LOC~~ SOLN
40.0000 mg | Freq: Every day | SUBCUTANEOUS | Status: DC
  Administered 2012-09-02 – 2012-09-04 (×3): 40 mg via SUBCUTANEOUS
  Filled 2012-09-02 (×4): qty 0.4

## 2012-09-02 MED ORDER — INSULIN DETEMIR 100 UNIT/ML ~~LOC~~ SOLN
30.0000 [IU] | Freq: Every day | SUBCUTANEOUS | Status: DC
  Administered 2012-09-02 – 2012-09-03 (×2): 30 [IU] via SUBCUTANEOUS

## 2012-09-02 MED ORDER — FUROSEMIDE 10 MG/ML IJ SOLN
40.0000 mg | Freq: Two times a day (BID) | INTRAMUSCULAR | Status: AC
  Administered 2012-09-02 (×2): 40 mg via INTRAVENOUS
  Filled 2012-09-02 (×2): qty 4

## 2012-09-02 NOTE — Progress Notes (Signed)
1 Day Post-Op Procedure(s) (LRB): CORONARY ARTERY BYPASS GRAFTING (CABG) (N/A) INTRAOPERATIVE TRANSESOPHAGEAL ECHOCARDIOGRAM (N/A) Subjective: No complaints  Objective: Vital signs in last 24 hours: Temp:  [96.6 F (35.9 C)-100.6 F (38.1 C)] 99.7 F (37.6 C) (02/19 0900) Pulse Rate:  [44-107] 90 (02/19 0900) Cardiac Rhythm:  [-] Atrial paced (02/19 0800) Resp:  [12-21] 13 (02/19 0900) BP: (91-128)/(63-87) 104/77 mmHg (02/19 0900) SpO2:  [54 %-100 %] 100 % (02/19 0900) FiO2 (%):  [39.7 %-100 %] 50 % (02/19 0730) Weight:  [95.2 kg (209 lb 14.1 oz)-98.6 kg (217 lb 6 oz)] 98.6 kg (217 lb 6 oz) (02/19 0500)  Hemodynamic parameters for last 24 hours: PAP: (32-51)/(16-31) 35/20 mmHg CO:  [4.6 L/min-6.8 L/min] 6.3 L/min CI:  [2.2 L/min/m2-3.2 L/min/m2] 3 L/min/m2  Intake/Output from previous day: 02/18 0701 - 02/19 0700 In: 6318.3 [I.V.:4818.3; Blood:390; NG/GT:60; IV Piggyback:1050] Out: 4415 [Urine:3235; Blood:650; Chest Tube:530] Intake/Output this shift: Total I/O In: 125.1 [I.V.:75.1; IV Piggyback:50] Out: 120 [Urine:90; Chest Tube:30]  General appearance: alert and cooperative Neurologic: intact Heart: regular rate and rhythm, S1, S2 normal, no murmur, click, rub or gallop Lungs: clear to auscultation bilaterally Extremities: edema mild Wound: dressing dry  Lab Results:  Recent Labs  09/01/12 1959 09/01/12 2002 09/02/12 0405  WBC 19.8*  --  18.9*  HGB 12.7* 12.2* 12.2*  HCT 36.8* 36.0* 35.2*  PLT 308  --  309   BMET:  Recent Labs  09/01/12 0300  09/01/12 2002 09/02/12 0405  NA 140  < > 140 139  K 4.3  < > 4.0 3.7  CL 103  --  109 106  CO2 23  --   --  26  GLUCOSE 148*  < > 120* 138*  BUN 25*  --  18 18  CREATININE 1.37*  < > 1.30 1.22  CALCIUM 8.6  --   --  7.7*  < > = values in this interval not displayed.  PT/INR:  Recent Labs  09/01/12 1400  LABPROT 16.3*  INR 1.34   ABG    Component Value Date/Time   PHART 7.365 09/02/2012 0359   HCO3  25.8* 09/02/2012 0359   TCO2 27 09/02/2012 0359   ACIDBASEDEF 2.0 09/02/2012 0107   O2SAT 91.0 09/02/2012 0359   CBG (last 3)   Recent Labs  09/02/12 0504 09/02/12 0558 09/02/12 0651  GLUCAP 120* 111* 99    Assessment/Plan: S/P Procedure(s) (LRB): CORONARY ARTERY BYPASS GRAFTING (CABG) (N/A) INTRAOPERATIVE TRANSESOPHAGEAL ECHOCARDIOGRAM (N/A) Mobilize Diuresis Diabetes control d/c tubes/lines Continue foley due to patient in ICU and urinary output monitoring See progression orders   LOS: 2 days    Jona Erkkila K 09/02/2012

## 2012-09-02 NOTE — Plan of Care (Signed)
Problem: Phase II Progression Outcomes Goal: Tolerates weaning with O2 Sat > 90 Outcome: Progressing Weaned to 5 L from 50 % venturi. Goal: CBGs/Blood glucose < or equal to 120 Outcome: Progressing Off insulin drip. Levimir given.

## 2012-09-02 NOTE — Progress Notes (Signed)
[facility]  Subjective: Denies chest pain. Some mild SOB. Using [place].  Objective: Vital signs in last 24 hours: Temp:  [96.6 F (35.9 C)-100.6 F (38.1 C)] 99.7 F (37.6 C) (02/19 0900) Pulse Rate:  [44-107] 90 (02/19 0900) Resp:  [12-21] 13 (02/19 0900) BP: (91-128)/(63-87) 104/77 mmHg (02/19 0900) SpO2:  [54 %-100 %] 100 % (02/19 0900) FiO2 (%):  [39.7 %-100 %] 50 % (02/19 0730) Weight:  [209 lb 14.1 oz (95.2 kg)-217 lb 6 oz (98.6 kg)] 217 lb 6 oz (98.6 kg) (02/19 0500)    Intake/Output from previous day: 02/18 0701 - 02/19 0700 In: 6318.3 [I.V.:4818.3; Blood:390; NG/GT:60; IV Piggyback:1050] Out: 4415 [Urine:3235; Blood:650; Chest Tube:530] Intake/Output this shift: Total I/O In: 125.1 [I.V.:75.1; IV Piggyback:50] Out: 120 [Urine:90; Chest Tube:30]  Medications Current Facility-Administered Medications  Medication Dose Route Frequency Provider Last Rate Last Dose  . 0.45 % sodium chloride infusion   Intravenous Continuous Nani Skillern, PA 20 mL/hr at 09/01/12 1453    . 0.9 %  sodium chloride infusion   Intravenous Continuous Nani Skillern, PA 20 mL/hr at 09/01/12 1445    . 0.9 %  sodium chloride infusion  250 mL Intravenous PRN Nani Skillern, PA      . acetaminophen (TYLENOL) tablet 1,000 mg  1,000 mg Oral Q6H Nani Skillern, PA   1,000 mg at 09/02/12 [id number]   Or  . acetaminophen (TYLENOL) solution 975 mg  975 mg Per Tube Q6H Nani Skillern, PA   975 mg at 09/01/12 2306  . albumin human 5 % solution 250 mL  250 mL Intravenous Q15 min PRN Nani Skillern, PA      . albuterol (PROVENTIL) (5 MG/ML) 0.5% nebulizer solution 2.5 mg  2.5 mg Nebulization Q3H PRN Gaye Pollack, MD      . aspirin EC tablet 325 mg  325 mg Oral Daily Donielle Liston Alba, PA       Or  . aspirin chewable tablet 324 mg  324 mg Per Tube Daily Donielle Liston Alba, PA      . bisacodyl (DULCOLAX) EC tablet 10 mg  10 mg Oral Daily  Nani Skillern, PA       Or  . bisacodyl (DULCOLAX) suppository 10 mg  10 mg Rectal Daily Donielle Liston Alba, PA      . cefUROXime (ZINACEF) 1.5 g in dextrose 5 % 50 mL IVPB  1.5 g Intravenous Q12H Nani Skillern, PA   1.5 g at 09/01/12 2218  . dexmedetomidine (PRECEDEX) 200 MCG/50ML infusion  0.1-0.7 mcg/kg/hr Intravenous Continuous Nani Skillern, PA   0.2 mcg/kg/hr at 09/01/12 2115  . docusate sodium (COLACE) capsule 200 mg  200 mg Oral Daily Nani Skillern, PA      . DOPamine (INTROPIN) 800 mg in dextrose 5 % 250 mL infusion  2 mcg/kg/min Intravenous Continuous Nani Skillern, PA 5.4 mL/hr at 09/02/12 0804 3 mcg/kg/min at 09/02/12 0804  . gemfibrozil (LOPID) tablet 600 mg  600 mg Oral BID AC Nani Skillern, PA   600 mg at 09/02/12 0940  . insulin regular (NOVOLIN R,HUMULIN R) 1 Units/mL in sodium chloride 0.9 % 100 mL infusion   Intravenous Continuous Nani Skillern, PA 0.8 mL/hr at 09/02/12 0905 0.8 Units/hr at 09/02/12 0905  . insulin regular bolus via infusion 0-10 Units  0-10 Units Intravenous TID WC Donielle Liston Alba, PA      . lactated ringers infusion  Intravenous Continuous Nani Skillern, PA 20 mL/hr at 09/01/12 1459 20 mL at 09/01/12 1459  . metoCLOPramide (REGLAN) injection 10 mg  10 mg Intravenous Q8H Nani Skillern, PA   10 mg at 09/02/12 0615  . metoprolol (LOPRESSOR) injection 2.5-5 mg  2.5-5 mg Intravenous Q2H PRN Nani Skillern, PA      . metoprolol tartrate (LOPRESSOR) tablet 12.5 mg  12.5 mg Oral BID Nani Skillern, PA       Or  . metoprolol tartrate (LOPRESSOR) 25 mg/10 mL oral suspension 12.5 mg  12.5 mg Per Tube BID Nani Skillern, PA      . midazolam (VERSED) injection 2 mg  2 mg Intravenous Q1H PRN Nani Skillern, PA   1 mg at 09/01/12 1652  . morphine 2 MG/ML injection 2-5 mg  2-5 mg Intravenous Q1H PRN Nani Skillern, PA   2 mg at 09/02/12 0608  . nitroGLYCERIN 0.2 mg/mL in  dextrose 5 % infusion  0-100 mcg/min Intravenous Continuous Nani Skillern, PA      . ondansetron [facility]) injection 4 mg  4 mg Intravenous Q6H PRN Nani Skillern, PA      . oxyCODONE (Oxy IR/ROXICODONE) immediate release tablet 5-10 mg  5-10 mg Oral Q3H PRN Nani Skillern, PA   10 mg at 09/02/12 0500  . [START ON 09/03/2012] pantoprazole (PROTONIX) EC tablet 40 mg  40 mg Oral Daily Donielle Liston Alba, PA      . phenylephrine (NEO-SYNEPHRINE) 20,000 mcg in dextrose 5 % 250 mL infusion  0-100 mcg/min Intravenous Continuous Nani Skillern, PA   5 mcg/min at 09/01/12 1945  . sodium chloride 0.9 % injection 3 mL  3 mL Intravenous Q12H Donielle Liston Alba, PA      . sodium chloride 0.9 % injection 3 mL  3 mL Intravenous PRN Nani Skillern, PA        PE: General appearance: alert, cooperative and no distress Lungs: clear to auscultation bilaterally Heart: regular rate and rhythm Extremities: no LEE Pulses: 2+ and symmetric Skin: warm and dry Neurologic: Grossly normal  Lab Results:   Recent Labs  09/01/12 1400  09/01/12 1959 09/01/12 2002 09/02/12 0405  WBC 25.6*  --  19.8*  --  18.9*  HGB 12.0*  < > 12.7* 12.2* 12.2*  HCT 34.6*  < > 36.8* 36.0* 35.2*  PLT 298  --  308  --  309  < > = values in this interval not displayed. BMET  Recent Labs  08/31/12 2038  09/01/12 0300  09/01/12 1426 09/01/12 1959 09/01/12 2002 09/02/12 0405  NA 140  < > 140  < > 140  --  140 139  K 3.6  < > 4.3  < > 3.5  --  4.0 3.7  CL 103  < > 103  --   --   --  109 106  CO2 24  --  23  --   --   --   --  26  GLUCOSE 129*  < > 148*  < > 127*  --  120* 138*  BUN 25*  < > 25*  --   --   --  18 18  CREATININE 1.39*  < > 1.37*  --   --  1.18 1.30 1.22  CALCIUM 8.9  --  8.6  --   --   --   --  7.7*  < > = values in this interval not displayed. PT/INR  Recent Labs  08/31/12 1923 08/31/12 2038 09/01/12 1400  LABPROT 13.5 13.0 16.3*  INR 1.04 0.99 1.34    Cholesterol  Recent Labs  08/31/12 2338  CHOL 209*   Studies/Results: CXR 2/19 - pending   Assessment/Plan   Active Problems:   Hypertension   Borderline diabetic   STEMI (ST elevation myocardial infarction)   Hiatal hernia   CAD S/P CABG X 3 (LIMA to LAD, SVG to OM, SVG to RCA) -2/18  Plan:  S/P CABG x 3 (LIMA to LAD, SVG to OM, SVG to RCA). POD#1. Surgeon was Dr. Cyndia Bent. HR and BP both stable. No post-operative a-fib or other arrhthymias noted on telemetry. He is on IV Amiodarone and Lopressor. He remains on Dopamine. Labs are WNL. H/H stable. CXR from today pending. Will continue to follow. MD to see and will provide further recommendation.     LOS: 2 days    Brittainy M. Rosita Fire, PA-C 09/02/2012 9:40 AM

## 2012-09-02 NOTE — Progress Notes (Signed)
TCTS  Patient progressing with pulmonary status now on nasal cannula P.m. labs satisfactory potassium 4.2 creatinine 1.5 hematocrit 38.2 Sinus rhythm

## 2012-09-02 NOTE — Progress Notes (Signed)
Anesthesiology Follow-up:  Awake and alert, neuro intact, breathing unlabored, hemodynamically stable, no further episodes of bronchospasm  VS; T-37.8 BP 116/85 HR- 96 (SR) RR 15 O2 Sat 97% on 50% venturi mask  Glucose 120 K-4.0 BUN/Cr  25/1.40 CK/MB 202/2.6 Troponin I < 0.30 H/H 12.0/34.6 Plts 430,000  Extubated 10 hours post-op.  57 y/o male 1 day S/P CABG x 3 following acute L. Circumflex STEMI on 2/17 treated with emergent non-stenting PTCA. Still requiring 50% mask O2 CXR looks OK. Doing well overall  Roberts Gaudy, MD

## 2012-09-02 NOTE — Procedures (Signed)
Extubation Procedure Note  Patient Details:   Name: [name] DOB: [birth year] MRN: [id number]   Airway Documentation:   Patient extubated to 4 lpm nasal cannula.  VC 1000 ml, NIF -35, pt able to hold head off bed 10 seconds.  Able to breathe around deflated cuff and vocalize post procedure.  Tolerated well, no complications.    Evaluation  O2 sats: stable throughout Complications: No apparent complications Patient did tolerate procedure well. Bilateral Breath Sounds: Clear   Yes  Tineka Uriegas, Elwyn Lade 09/02/2012, 12:35 AM

## 2012-09-02 NOTE — Progress Notes (Signed)
Pt. Seen and examined. Agree with the NP/PA-C note as written.  Doing well post-op. No a-fib. Weaning medications. No further suggestions at this point.  Pixie Casino, MD, [facility] Attending Cardiologist [facility] [place]

## 2012-09-03 ENCOUNTER — Inpatient Hospital Stay (HOSPITAL_COMMUNITY): Payer: BC Managed Care – PPO

## 2012-09-03 LAB — GLUCOSE, CAPILLARY
Glucose-Capillary: 98 mg/dL (ref 70–99)
Glucose-Capillary: 134 mg/dL — ABNORMAL HIGH (ref 70–99)
Glucose-Capillary: 127 mg/dL — ABNORMAL HIGH (ref 70–99)
Glucose-Capillary: 132 mg/dL — ABNORMAL HIGH (ref 70–99)

## 2012-09-03 LAB — BASIC METABOLIC PANEL
Chloride: 101 mEq/L (ref 96–112)
Creatinine, Ser: 1.39 mg/dL — ABNORMAL HIGH (ref 0.50–1.35)
GFR calc Af Amer: 63 mL/min — ABNORMAL LOW (ref >90)
Sodium: 139 mEq/L (ref 135–145)

## 2012-09-03 LAB — CBC
HCT: 37.1 % — ABNORMAL LOW (ref 39.0–52.0)
Platelets: 290 10*3/uL (ref 150–400)
RDW: 12.6 % (ref 11.5–15.5)
WBC: 18.1 10*3/uL — ABNORMAL HIGH (ref 4.0–10.5)

## 2012-09-03 MED ORDER — SODIUM CHLORIDE 0.9 % IJ SOLN
3.0000 mL | Freq: Two times a day (BID) | INTRAMUSCULAR | Status: DC
  Administered 2012-09-03 – 2012-09-04 (×3): 3 mL via INTRAVENOUS

## 2012-09-03 MED ORDER — SODIUM CHLORIDE 0.9 % IJ SOLN: 3.0000 mL | INTRAMUSCULAR | Status: DC | PRN

## 2012-09-03 MED ORDER — PANTOPRAZOLE SODIUM 40 MG PO TBEC
40.0000 mg | DELAYED_RELEASE_TABLET | Freq: Every day | ORAL | Status: DC
  Administered 2012-09-04 – 2012-09-05 (×2): 40 mg via ORAL
  Filled 2012-09-03 (×2): qty 1

## 2012-09-03 MED ORDER — MOVING RIGHT ALONG BOOK
Freq: Once | Status: AC
  Administered 2012-09-03: 15:00:00
  Filled 2012-09-03: qty 1

## 2012-09-03 MED ORDER — INSULIN ASPART 100 UNIT/ML ~~LOC~~ SOLN
0.0000 [IU] | SUBCUTANEOUS | Status: DC
  Administered 2012-09-03: 2 [IU] via SUBCUTANEOUS

## 2012-09-03 MED ORDER — DOCUSATE SODIUM 100 MG PO CAPS
200.0000 mg | ORAL_CAPSULE | Freq: Every day | ORAL | Status: DC
  Filled 2012-09-03: qty 2

## 2012-09-03 MED ORDER — OXYCODONE HCL 5 MG PO TABS
5.0000 mg | ORAL_TABLET | ORAL | Status: DC | PRN
  Administered 2012-09-03 – 2012-09-04 (×4): 10 mg via ORAL
  Filled 2012-09-03 (×4): qty 2

## 2012-09-03 MED ORDER — BISACODYL 5 MG PO TBEC: 10.0000 mg | DELAYED_RELEASE_TABLET | Freq: Every day | ORAL | Status: DC | PRN

## 2012-09-03 MED ORDER — ACETAMINOPHEN 325 MG PO TABS: 650.0000 mg | ORAL_TABLET | Freq: Four times a day (QID) | ORAL | Status: DC | PRN

## 2012-09-03 MED ORDER — BISACODYL 10 MG RE SUPP: 10.0000 mg | Freq: Every day | RECTAL | Status: DC | PRN

## 2012-09-03 MED ORDER — TRAMADOL HCL 50 MG PO TABS
50.0000 mg | ORAL_TABLET | ORAL | Status: DC | PRN
  Administered 2012-09-03: 100 mg via ORAL
  Filled 2012-09-03: qty 2

## 2012-09-03 MED ORDER — POTASSIUM CHLORIDE CRYS ER 20 MEQ PO TBCR
20.0000 meq | EXTENDED_RELEASE_TABLET | Freq: Two times a day (BID) | ORAL | Status: DC
  Administered 2012-09-03: 20 meq via ORAL
  Filled 2012-09-03 (×3): qty 1

## 2012-09-03 MED ORDER — ONDANSETRON HCL 4 MG PO TABS: 4.0000 mg | ORAL_TABLET | Freq: Four times a day (QID) | ORAL | Status: DC | PRN

## 2012-09-03 MED ORDER — FUROSEMIDE 40 MG PO TABS
40.0000 mg | ORAL_TABLET | Freq: Every day | ORAL | Status: DC
  Administered 2012-09-03: 40 mg via ORAL
  Filled 2012-09-03 (×2): qty 1

## 2012-09-03 MED ORDER — LEVALBUTEROL HCL 0.63 MG/3ML IN NEBU
0.6300 mg | INHALATION_SOLUTION | Freq: Four times a day (QID) | RESPIRATORY_TRACT | Status: DC | PRN
  Filled 2012-09-03: qty 3

## 2012-09-03 MED ORDER — ONDANSETRON HCL 4 MG/2ML IJ SOLN: 4.0000 mg | Freq: Four times a day (QID) | INTRAMUSCULAR | Status: DC | PRN

## 2012-09-03 MED ORDER — METOPROLOL TARTRATE 25 MG PO TABS
25.0000 mg | ORAL_TABLET | Freq: Two times a day (BID) | ORAL | Status: DC
  Administered 2012-09-03 – 2012-09-05 (×5): 25 mg via ORAL
  Filled 2012-09-03 (×7): qty 1

## 2012-09-03 MED ORDER — SODIUM CHLORIDE 0.9 % IV SOLN: 250.0000 mL | INTRAVENOUS | Status: DC | PRN

## 2012-09-03 MED ORDER — ASPIRIN EC 325 MG PO TBEC
325.0000 mg | DELAYED_RELEASE_TABLET | Freq: Every day | ORAL | Status: DC
  Administered 2012-09-04 – 2012-09-05 (×2): 325 mg via ORAL
  Filled 2012-09-03 (×2): qty 1

## 2012-09-03 MED FILL — Sodium Chloride Irrigation Soln 0.9%: Qty: 3000 | Status: AC

## 2012-09-03 MED FILL — Sodium Bicarbonate IV Soln 8.4%: INTRAVENOUS | Qty: 50 | Status: AC

## 2012-09-03 MED FILL — Lidocaine HCl IV Inj 20 MG/ML: INTRAVENOUS | Qty: 5 | Status: AC

## 2012-09-03 MED FILL — Heparin Sodium (Porcine) Inj 1000 Unit/ML: INTRAMUSCULAR | Qty: 10 | Status: AC

## 2012-09-03 MED FILL — Electrolyte-R (PH 7.4) Solution: INTRAVENOUS | Qty: 3000 | Status: AC

## 2012-09-03 MED FILL — Sodium Chloride IV Soln 0.9%: INTRAVENOUS | Qty: 1000 | Status: AC

## 2012-09-03 MED FILL — Mannitol IV Soln 20%: INTRAVENOUS | Qty: 500 | Status: AC

## 2012-09-03 MED FILL — Heparin Sodium (Porcine) Inj 1000 Unit/ML: INTRAMUSCULAR | Qty: 30 | Status: AC

## 2012-09-03 NOTE — Anesthesia Postprocedure Evaluation (Signed)
  Anesthesia Post-op Note  Patient: [name]  Procedure(s) Performed: Procedure(s) with comments: CORONARY ARTERY BYPASS GRAFTING (CABG) (N/A) - Coronary Artery Bypass Grafting Times Three Using Left Internal Mammary Artery and Left Saphenous leg Vein Harvested Endoscopically INTRAOPERATIVE TRANSESOPHAGEAL ECHOCARDIOGRAM (N/A)  Patient Location: SICU  Anesthesia Type:General  Level of Consciousness: awake, alert  and oriented  Airway and Oxygen Therapy: Patient Spontanous Breathing and Patient connected to nasal cannula oxygen  Post-op Pain: mild  Post-op Assessment: Post-op Vital signs reviewed, Patient's Cardiovascular Status Stable and Respiratory Function Stable  Post-op Vital Signs: Reviewed and stable  Complications: No apparent anesthesia complications

## 2012-09-03 NOTE — Progress Notes (Signed)
Pt arrived to unit via 2300 RN. VSS, pt resting. Will continue to monitor.

## 2012-09-03 NOTE — Progress Notes (Signed)
Pt voided post foley.

## 2012-09-03 NOTE — Progress Notes (Signed)
I have seen and evaluated the patient this PM along with Ellen Henri, PA. I agree with her findings, examination as well as impression recommendations.  Relatively stable progression post-op.  Renal Fxn stable to improved.   BP & HR  Stable & diuresing well - not currently on diuretic.  Tolerating low dose BB Fibrate for HLD. DM - Insuling standing & SSI WBC elevated but continues to decrease  No further recommendations.  Leonie Man, M.D., M.S. [facility] [address] [place], [place]  [address]  [phone] Pager # [phone] 09/03/2012 12:37 PM

## 2012-09-03 NOTE — Progress Notes (Signed)
[facility]  Subjective: Reports chest soreness but no pain. Still using [place] at 4L/min.  Objective: Vital signs in last 24 hours: Temp:  [98 F (36.7 C)-100 F (37.8 C)] 99 F (37.2 C) (02/20 0802) Pulse Rate:  [74-113] 74 (02/20 0800) Resp:  [12-23] 16 (02/20 0800) BP: (96-144)/(67-97) 110/81 mmHg (02/20 0800) SpO2:  [92 %-100 %] 92 % (02/20 0800) FiO2 (%):  [40 %] 40 % (02/19 1000) Weight:  [209 lb 3.5 oz (94.9 kg)] 209 lb 3.5 oz (94.9 kg) (02/20 0600)    Intake/Output from previous day: 02/19 0701 - 02/20 0700 In: 1502.7 [P.O.:780; I.V.:572.7; IV Piggyback:150] Out: 4150 [Urine:4010; Chest Tube:140] Intake/Output this shift: Total I/O In: 20 [I.V.:20] Out: 55 [Urine:55]  Medications Current Facility-Administered Medications  Medication Dose Route Frequency Provider Last Rate Last Dose  . 0.45 % sodium chloride infusion   Intravenous Continuous Nani Skillern, PA 20 mL/hr at 09/02/12 1800    . 0.9 %  sodium chloride infusion   Intravenous Continuous Nani Skillern, PA 20 mL/hr at 09/01/12 1445    . 0.9 %  sodium chloride infusion  250 mL Intravenous PRN Nani Skillern, PA      . acetaminophen (TYLENOL) tablet 1,000 mg  1,000 mg Oral Q6H Nani Skillern, PA   1,000 mg at 09/03/12 [id number]   Or  . acetaminophen (TYLENOL) solution 975 mg  975 mg Per Tube Q6H Nani Skillern, PA   975 mg at 09/01/12 2306  . albuterol (PROVENTIL) (5 MG/ML) 0.5% nebulizer solution 2.5 mg  2.5 mg Nebulization Q3H PRN Gaye Pollack, MD      . aspirin EC tablet 325 mg  325 mg Oral Daily Nani Skillern, PA   325 mg at 09/02/12 1051   Or  . aspirin chewable tablet 324 mg  324 mg Per Tube Daily Donielle Liston [place], PA      . bisacodyl (DULCOLAX) EC tablet 10 mg  10 mg Oral Daily Nani Skillern, PA   10 mg at 09/02/12 1051   Or  . bisacodyl (DULCOLAX) suppository 10 mg  10 mg Rectal Daily Nani Skillern, PA      . cefUROXime  (ZINACEF) 1.5 g in dextrose 5 % 50 mL IVPB  1.5 g Intravenous Q12H Nani Skillern, PA   1.5 g at 09/02/12 2130  . dexmedetomidine (PRECEDEX) 200 MCG/50ML infusion  0.1-0.7 mcg/kg/hr Intravenous Continuous Nani Skillern, PA   0.2 mcg/kg/hr at 09/01/12 2115  . docusate sodium (COLACE) capsule 200 mg  200 mg Oral Daily Nani Skillern, PA   200 mg at 09/02/12 1051  . DOPamine (INTROPIN) 800 mg in dextrose 5 % 250 mL infusion  2 mcg/kg/min Intravenous Continuous Nani Skillern, PA   2 mcg/kg/min at 09/02/12 1000  . enoxaparin (LOVENOX) injection 40 mg  40 mg Subcutaneous QHS Gaye Pollack, MD   40 mg at 09/02/12 2138  . gemfibrozil (LOPID) tablet 600 mg  600 mg Oral BID AC Nani Skillern, PA   600 mg at 09/03/12 0858  . insulin aspart (novoLOG) injection 0-24 Units  0-24 Units Subcutaneous Q4H Gaye Pollack, MD   2 Units at 09/03/12 0857  . insulin detemir (LEVEMIR) injection 30 Units  30 Units Subcutaneous Q1200 Gaye Pollack, MD   30 Units at 09/02/12 1233  . lactated ringers infusion   Intravenous Continuous Nani Skillern, PA   20 mL at 09/01/12 1459  .  metoprolol (LOPRESSOR) injection 2.5-5 mg  2.5-5 mg Intravenous Q2H PRN Nani Skillern, PA      . metoprolol tartrate (LOPRESSOR) tablet 12.5 mg  12.5 mg Oral BID Nani Skillern, PA   12.5 mg at 09/02/12 2131   Or  . metoprolol tartrate (LOPRESSOR) 25 mg/10 mL oral suspension 12.5 mg  12.5 mg Per Tube BID Nani Skillern, PA      . midazolam (VERSED) injection 2 mg  2 mg Intravenous Q1H PRN Nani Skillern, PA   1 mg at 09/01/12 1652  . morphine 2 MG/ML injection 2-5 mg  2-5 mg Intravenous Q1H PRN Nani Skillern, PA   4 mg at 09/03/12 0857  . nitroGLYCERIN 0.2 mg/mL in dextrose 5 % infusion  0-100 mcg/min Intravenous Continuous Nani Skillern, PA      . ondansetron [facility]) injection 4 mg  4 mg Intravenous Q6H PRN Nani Skillern, PA      . oxyCODONE (Oxy IR/ROXICODONE)  immediate release tablet 5-10 mg  5-10 mg Oral Q3H PRN Nani Skillern, PA   10 mg at 09/03/12 [id number]  . pantoprazole (PROTONIX) EC tablet 40 mg  40 mg Oral Daily Donielle Liston [place], PA      . phenylephrine (NEO-SYNEPHRINE) 20,000 mcg in dextrose 5 % 250 mL infusion  0-100 mcg/min Intravenous Continuous Nani Skillern, PA   5 mcg/min at 09/01/12 1945  . sodium chloride 0.9 % injection 3 mL  3 mL Intravenous Q12H Nani Skillern, PA   3 mL at 09/02/12 2139  . sodium chloride 0.9 % injection 3 mL  3 mL Intravenous PRN Nani Skillern, PA        PE: General appearance: alert, cooperative and no distress Lungs: rales bibasilar Heart: regular rate and rhythm, S1, S2 normal, no murmur, click, rub or gallop Extremities: no LEE Pulses: 2+ and symmetric Skin: warm and dry Neurologic: Grossly normal  Lab Results:   Recent Labs  09/02/12 0405 09/02/12 1644 09/02/12 1722 09/03/12 0415  WBC 18.9* 20.0*  --  18.1*  HGB 12.2* 13.2 13.6 12.5*  HCT 35.2* 38.2* 40.0 37.1*  PLT 309 335  --  290   BMET  Recent Labs  09/01/12 0300  09/02/12 0405 09/02/12 1644 09/02/12 1722 09/03/12 0415  NA 140  < > 139  --  140 139  K 4.3  < > 3.7  --  4.2 4.1  CL 103  < > 106  --  103 101  CO2 23  --  26  --   --  30  GLUCOSE 148*  < > 138*  --  131* 102*  BUN 25*  < > 18  --  21 22  CREATININE 1.37*  < > 1.22 1.35 1.50* 1.39*  CALCIUM 8.6  --  7.7*  --   --  8.3*  < > = values in this interval not displayed. PT/INR  Recent Labs  08/31/12 1923 08/31/12 2038 09/01/12 1400  LABPROT 13.5 13.0 16.3*  INR 1.04 0.99 1.34   Cholesterol  Recent Labs  08/31/12 2338  CHOL 209*   Studies/Results: CXR 2/20 PORTABLE CHEST - 1 VIEW  Comparison: Plain film chest 09/02/2012.  Findings: Left chest tube and Swan-Ganz catheter have been removed.  Right IJ sheath remains in place. No pneumothorax is identified.  Mild subsegmental atelectasis is seen in the left lung base. There  is  cardiomegaly without edema.  IMPRESSION:  1. Removal of Swan-Ganz catheter and  left chest tube. No  pneumothorax.  2. Subsegmental atelectasis left lung base.  Original Report Authenticated By: Orlean Patten, M.D.  Assessment/Plan  Active Problems:   Hypertension   Borderline diabetic   STEMI (ST elevation myocardial infarction)   Hiatal hernia   CAD S/P CABG X 3 (LIMA to LAD, SVG to OM, SVG to RCA) -2/18  Plan: S/P CABG x 3 (LIMA to LAD, SVG to OM, SVG to RCA). POD#2. Surgeon was Dr. Cyndia Bent. Normal progression. CT and Swan-Ganz catheter have been removed. All pressors have been D/C. HR and BP both stable. No post-operative a-fib or other arrhthymias noted on telemetry. He is no longer on Amiodarone.  He is on Lopressor. Labs are WNL. H/H stable. CXR from today shows subsegmental atelectasis of left lung base. He reports that he has been using incentive spirometer. He was encouraged to continue use. Will continue to follow. MD to see and will provide further recommendation.     LOS: 3 days    Amelia Macken M. Ladoris Gene 09/03/2012 9:41 AM

## 2012-09-03 NOTE — Care Management Note (Unsigned)
    Page 1 of 1   09/03/2012     4:15:03 PM   CARE MANAGEMENT NOTE 09/03/2012  Patient:  [name] [name]   Account Number:  [id number]  Date Initiated:  09/03/2012  Documentation initiated by:  Luz Lex  Subjective/Objective Assessment:   Post op CABG  Has spouse     Action/Plan:   Anticipated DC Date:  09/08/2012   Anticipated DC Plan:  [place]  CM consult      Choice offered to / List presented to:             Status of service:  In process, will continue to follow Medicare Important Message given?   (If response is "NO", the following Medicare IM given date [place] will be blank) Date Medicare IM given:   Date Additional Medicare IM given:    Discharge Disposition:    Per UR Regulation:  Reviewed for med. necessity/level of care/duration of stay  If discussed at [place] of Stay Meetings, dates discussed:    Comments:  ContactAllah, [place] [phone]  09/03/12 Damani Kelemen,RN,BSN [id number] PT S/P CABG X 3 ON 09/01/12.  PTA, PT INDEPENDENT, LIVES WITH SPOUSE.  WIFE TO PROVIDE 24HR CARE AT DC.  WILL FOLLOW FOR HOME NEEDS AS PT PROGRESSES.

## 2012-09-03 NOTE — Progress Notes (Signed)
2 Days Post-Op Procedure(s) (LRB): CORONARY ARTERY BYPASS GRAFTING (CABG) (N/A) INTRAOPERATIVE TRANSESOPHAGEAL ECHOCARDIOGRAM (N/A) Subjective:  Sore   Objective: Vital signs in last 24 hours: Temp:  [98 F (36.7 C)-100 F (37.8 C)] 99 F (37.2 C) (02/20 0802) Pulse Rate:  [74-113] 74 (02/20 0800) Cardiac Rhythm:  [-] Normal sinus rhythm (02/20 0800) Resp:  [12-23] 16 (02/20 0800) BP: (96-144)/(67-97) 110/81 mmHg (02/20 0800) SpO2:  [92 %-100 %] 92 % (02/20 0800) FiO2 (%):  [40 %] 40 % (02/19 1000) Weight:  [94.9 kg (209 lb 3.5 oz)] 94.9 kg (209 lb 3.5 oz) (02/20 0600)  Hemodynamic parameters for last 24 hours: PAP: (32-52)/(16-33) 43/32 mmHg  Intake/Output from previous day: 02/19 0701 - 02/20 0700 In: 1502.7 [P.O.:780; I.V.:572.7; IV Piggyback:150] Out: 4150 [Urine:4010; Chest Tube:140] Intake/Output this shift: Total I/O In: 20 [I.V.:20] Out: 55 [Urine:55]  General appearance: alert and cooperative Neurologic: intact Heart: regular rate and rhythm, S1, S2 normal, no murmur, click, rub or gallop Lungs: rales bibasilar Wound: healing well   Lab Results:  Recent Labs  09/02/12 1644 09/02/12 1722 09/03/12 0415  WBC 20.0*  --  18.1*  HGB 13.2 13.6 12.5*  HCT 38.2* 40.0 37.1*  PLT 335  --  290   BMET:  Recent Labs  09/02/12 0405  09/02/12 1722 09/03/12 0415  NA 139  --  140 139  K 3.7  --  4.2 4.1  CL 106  --  103 101  CO2 26  --   --  30  GLUCOSE 138*  --  131* 102*  BUN 18  --  21 22  CREATININE 1.22  < > 1.50* 1.39*  CALCIUM 7.7*  --   --  8.3*  < > = values in this interval not displayed.  PT/INR:  Recent Labs  09/01/12 1400  LABPROT 16.3*  INR 1.34   ABG    Component Value Date/Time   PHART 7.365 09/02/2012 0359   HCO3 25.8* 09/02/2012 0359   TCO2 29 09/02/2012 1722   ACIDBASEDEF 2.0 09/02/2012 0107   O2SAT 91.0 09/02/2012 0359   CBG (last 3)   Recent Labs  09/02/12 2023 09/02/12 2354 09/03/12 0405  GLUCAP 123* 102* 98   CXR:  clear but low lung volumes  Assessment/Plan: S/P Procedure(s) (LRB): CORONARY ARTERY BYPASS GRAFTING (CABG) (N/A) INTRAOPERATIVE TRANSESOPHAGEAL ECHOCARDIOGRAM (N/A) Weight is at preop. Mobilize Diabetes control Plan for transfer to step-down: see transfer orders   LOS: 3 days    [name] K 09/03/2012

## 2012-09-03 NOTE — Plan of Care (Signed)
Problem: Phase III Progression Outcomes Goal: Time patient transferred to PCTU/Telemetry POD Outcome: Completed/Met Date Met:  09/03/12 POD #2 at 1500

## 2012-09-04 ENCOUNTER — Encounter (HOSPITAL_COMMUNITY): Payer: Self-pay | Admitting: Surgery

## 2012-09-04 LAB — CBC
Platelets: 372 10*3/uL (ref 150–400)
RDW: 12.6 % (ref 11.5–15.5)
WBC: 20.2 10*3/uL — ABNORMAL HIGH (ref 4.0–10.5)

## 2012-09-04 LAB — BASIC METABOLIC PANEL
Chloride: 98 mEq/L (ref 96–112)
GFR calc Af Amer: 66 mL/min — ABNORMAL LOW (ref >90)
Potassium: 3.9 mEq/L (ref 3.5–5.1)
Sodium: 138 mEq/L (ref 135–145)

## 2012-09-04 LAB — GLUCOSE, CAPILLARY: Glucose-Capillary: 101 mg/dL — ABNORMAL HIGH (ref 70–99)

## 2012-09-04 MED ORDER — POTASSIUM CHLORIDE CRYS ER 20 MEQ PO TBCR
20.0000 meq | EXTENDED_RELEASE_TABLET | Freq: Once | ORAL | Status: AC
  Administered 2012-09-04: 20 meq via ORAL

## 2012-09-04 MED ORDER — POTASSIUM CHLORIDE CRYS ER 20 MEQ PO TBCR
20.0000 meq | EXTENDED_RELEASE_TABLET | Freq: Every day | ORAL | Status: DC
  Administered 2012-09-04: 20 meq via ORAL
  Filled 2012-09-04: qty 1

## 2012-09-04 MED ORDER — ASPIRIN 325 MG PO TBEC: 325.0000 mg | DELAYED_RELEASE_TABLET | Freq: Every day | ORAL | Status: DC

## 2012-09-04 MED ORDER — METOPROLOL TARTRATE 25 MG PO TABS: 25.0000 mg | ORAL_TABLET | Freq: Two times a day (BID) | ORAL | Status: DC

## 2012-09-04 MED ORDER — DOCUSATE SODIUM 100 MG PO CAPS
200.0000 mg | ORAL_CAPSULE | Freq: Every day | ORAL | Status: DC
  Administered 2012-09-05: 200 mg via ORAL
  Filled 2012-09-04: qty 2

## 2012-09-04 MED ORDER — OXYCODONE HCL 5 MG PO TABS: 5.0000 mg | ORAL_TABLET | ORAL | Status: DC | PRN

## 2012-09-04 MED FILL — Potassium Chloride Inj 2 mEq/ML: INTRAVENOUS | Qty: 40 | Status: AC

## 2012-09-04 MED FILL — Magnesium Sulfate Inj 50%: INTRAMUSCULAR | Qty: 10 | Status: AC

## 2012-09-04 NOTE — Discharge Summary (Signed)
Physician Discharge Summary  Patient ID: [name] MRN: [id number] DOB/AGE: 10/16/1955 57 y.o.  Admit date: 08/31/2012 Discharge date: 09/06/2012  Admission Diagnoses: 1.s/p STEMI 2.Multivessel CAD 3. History of hypertension 4.History of borderline DM 5.History of mini stroke  Discharge Diagnoses:  1.s/p STEMI 2.Multivessel CAD 3. History of hypertension 4.History of borderline DM 5.History of mini stroke   Procedure (s):  1.Cardiac catheterization done by Dr. [place] on 08/31/2012: Coronary dominance: right  Left mainstem: Normal  Left anterior descending (LAD): There is a 50% stenosis in the proximal LAD at the takeoff of the first septal perforator. The mid LAD is severely and diffusely diseased up to 80-90%. The LAD does give collaterals to the distal PDA.  Left circumflex (LCx): 100% occluded proximally.  Right coronary artery (RCA): Complex 90% angulated stenosis in the proximal RCA. This is a dominant vessel.  Left ventriculography: Not performed to conserve dye load. The lesion was predilated with a 2.5 mm compliant balloon. PCI of the left circumflex. Following PCI, there was less than 20% residual stenosis and TIMI-3 flow.   2.Median sternotomy, extracorporeal circulation,  coronary artery bypass graft surgery x3 using a left internal mammary artery graft to left anterior descending coronary artery, with a saphenous vein graft to the obtuse marginal branch of the left circumflex coronary artery, and a saphenous vein graft to the right coronary artery.Endoscopic vein harvesting from the left leg by Dr. Cyndia [name] on 09/01/2012.  History of Presenting Illness: This is a 57 year old Caucasian male with history of hypertension, severe mixed hyperlipidemia, borderline diabetes, and previous stroke (without residual deficit) who presented with severe intrascapular pain, as well as substernal chest pain associated with nausea and shortness of breath. His initial electrocardiogram  showed ST segment depression and T wave inversion in leads V1-V3. Repeat electrocardiogram showed worsening ST segment depression in the anterior precordial leads as well as ST segment elevation in lead V6 consistent with acute posterior lateral ST segment elevation MI. His initial troponin was 0.02, and this am was > 20. He was taken the Cath Lab on 2/18 where the culprit was found to be an occluded proximal left circumflex coronary artery. He also had high-grade LAD and right coronary stenoses and the left circumflex was opened with PTCA without a stent. He was started on Integrilin and transitioned to heparin. Overnight,he did have 2 brief runs of nonsustained ventricular tachycardia, as well some nausea and vomiting. A cardiothoracic consultation was obtained with Dr. Cyndia [name] for the consideration of coronary artery bypass grafting surgery. He denied any chest or back pain at the time of being seen.  Potential risks, complications, and benefits of the surgery were discussed with the patient and he agreed to proceed. He underwent a CABG x 3 on 09/01/2012.  Brief Hospital Course:  He was extubated without difficulty early the morning of post operative day one. He remained afebrile and hemodynamically stable. His [name], a line, chest tubes, and foley were all removed early in his post operative course. He was started on Lopressor. He was volume over loaded and diuresed accordingly. He was weaned off the Insulin drip. His glucose remained below 130. His pre op HGA1C was 6. He is likely pre diabetic. He will need to continue on a diabetic diet and follow up with his medical doctor. He was felt surgically stable for transfer from the ICU to PCTU for further convalescence on 09/03/2012. He has been ambulating on room air. He has been tolerating a diet and has had  a bowel movement. His epicardial pacing wires and chest tube sutures will be removed prior to his discharge. Provided he remains afebrile,  hemodynamically stable, and pending morning round evaluation, he will be surgically stable for discharge on 09/06/2012.    Latest Vital Signs: Blood pressure 97/77, pulse 101, temperature 98.7 F (37.1 C), temperature source Oral, resp. rate 20, height 5\' 8"  (1.727 m), weight 94.076 kg (207 lb 6.4 oz), SpO2 92.00%.  Physical Exam: Cardiovascular: Slightly tachy; no murmurs, gallops, or rubs.  Pulmonary: Slightly diminished at bases; no rales, wheezes, or rhonchi.  Abdomen: Soft, non tender, bowel sounds present.  Extremities: Trace bilateral lower extremity edema.  Wounds: Clean and dry. No erythema or signs of infection.   Discharge Condition:Stable  Recent laboratory studies:  Lab Results  Component Value Date   WBC 20.2* 09/04/2012   HGB 13.2 09/04/2012   HCT 37.9* 09/04/2012   MCV 90.0 09/04/2012   PLT 372 09/04/2012   Lab Results  Component Value Date   NA 138 09/04/2012   K 3.9 09/04/2012   CL 98 09/04/2012   CO2 30 09/04/2012   CREATININE 1.34 09/04/2012   GLUCOSE 106* 09/04/2012      Diagnostic Studies: Dg Chest Portable 1 View In Am  09/03/2012  *RADIOLOGY REPORT*  Clinical Data: Status post CABG.  PORTABLE CHEST - 1 VIEW  Comparison: Plain film chest 09/02/2012.  Findings: Left chest tube and Swan-Ganz catheter have been removed. Right IJ sheath remains in place.  No pneumothorax is identified. Mild subsegmental atelectasis is seen in the left lung base.  There is cardiomegaly without edema.  IMPRESSION:  1.  Removal of Swan-Ganz catheter and left chest tube.  No pneumothorax. 2.  Subsegmental atelectasis left lung base.   Original Report Authenticated By: Orlean Patten, M.D.      Future Appointments Provider Department Dept Phone   09/29/2012 2:30 PM Gaye Pollack, MD [facility] [phone]      Discharge Medications:   Medication List    STOP taking these medications       amLODipine 10 MG tablet  Commonly known as:   NORVASC     gemfibrozil 600 MG tablet  Commonly known as:  LOPID      TAKE these medications       aspirin 325 MG EC tablet  Take 1 tablet (325 mg total) by mouth daily.     atorvastatin 40 MG tablet  Commonly known as:  LIPITOR  Take 1 tablet (40 mg total) by mouth daily at 6 PM.     lisinopril 2.5 MG tablet  Commonly known as:  PRINIVIL,ZESTRIL  Take 1 tablet (2.5 mg total) by mouth daily.     metoprolol tartrate 25 MG tablet  Commonly known as:  LOPRESSOR  Take 1 tablet (25 mg total) by mouth 2 (two) times daily.     oxyCODONE 5 MG immediate release tablet  Commonly known as:  Oxy IR/ROXICODONE  Take 1-2 tablets (5-10 mg total) by mouth every 4 (four) hours as needed for pain.       The patient has been discharged on:   1.Beta Blocker:  Yes [  x ]                              No   [   ]  If No, reason:  2.Ace Inhibitor/ARB: Yes [ x  ]                                     No  [   ]                                     If No, reason:  3.Statin:   Yes [ x  ]                  No  [   ]                  If No, reason:  4.Ecasa:  Yes  [ x  ]                  No   [   ]                  If No, reason:  Follow Up Appointments:     Follow-up Information   Follow up with [facility], MD. (Call for a follow up for 2 weeks)    Contact information:   [place] [address] [phone]       Follow up with Gaye Pollack, MD. (PA/LAT CXR to be taken (at [place] which is in the same building as Dr. Vivi Martens office) on 09/29/2012 at 1:30 pm; Appointment with Dr. Cyndia [name] is on 09/29/2012 at 2:30 pm)    Contact information:   [place] [address] [phone]       Follow up with Medical doctor. (Call for a follow up regarding HGA1C 600)       Signed: Ashima Shrake MPA-C 09/04/2012, 9:33 AM

## 2012-09-04 NOTE — Progress Notes (Addendum)
                   [address]            [place] [address]          [phone]      3 Days Post-Op Procedure(s) (LRB): CORONARY ARTERY BYPASS GRAFTING (CABG) (N/A) INTRAOPERATIVE TRANSESOPHAGEAL ECHOCARDIOGRAM (N/A)  Subjective: Patient has some incisional pain this morning. Had loose stools yesterday.  Objective: Vital signs in last 24 hours: Temp:  [98.4 F (36.9 C)-99.7 F (37.6 C)] 98.7 F (37.1 C) (02/21 0533) Pulse Rate:  [74-107] 101 (02/21 0533) Cardiac Rhythm:  [-] Normal sinus rhythm (02/20 1926) Resp:  [12-22] 20 (02/21 0533) BP: (97-130)/(69-93) 97/77 mmHg (02/21 0533) SpO2:  [92 %-99 %] 92 % (02/21 0533) Weight:  [94.076 kg (207 lb 6.4 oz)] 94.076 kg (207 lb 6.4 oz) (02/21 0533)  Pre op weight  95 kg Current Weight  09/04/12 94.076 kg (207 lb 6.4 oz)      Intake/Output from previous day: 02/20 0701 - 02/21 0700 In: 360 [P.O.:240; I.V.:120] Out: 110 [Urine:110]   Physical Exam:  Cardiovascular: Slightly tachy; no murmurs, gallops, or rubs. Pulmonary: Slightly diminished at bases; no rales, wheezes, or rhonchi. Abdomen: Soft, non tender, bowel sounds present. Extremities: Trace bilateral lower extremity edema. Wounds: Clean and dry.  No erythema or signs of infection.  Lab Results: CBC: Recent Labs  09/03/12 0415 09/04/12 0510  WBC 18.1* 20.2*  HGB 12.5* 13.2  HCT 37.1* 37.9*  PLT 290 372   BMET:  Recent Labs  09/03/12 0415 09/04/12 0510  NA 139 138  K 4.1 3.9  CL 101 98  CO2 30 30  GLUCOSE 102* 106*  BUN 22 23  CREATININE 1.39* 1.34  CALCIUM 8.3* 8.8    PT/INR:  Lab Results  Component Value Date   INR 1.34 09/01/2012   INR 0.99 08/31/2012   INR 1.04 08/31/2012   ABG:  INR: Will add last result for INR, ABG once components are confirmed Will add last 4 CBG results once components are confirmed  Assessment/Plan:  1. CV - ST/SR. On Lopressor 25 bid. Monitor BP as is labile this am and may have to decrease  Lopressor. 2.  Pulmonary - Encourage incentive spirometer. 3. Volume Overload - On Lasix 40 daily. Below pre op weight and only trace LE edema. Will stop 4.  Mild Acute blood loss anemia - H and H stable at 13.2 and 37.9 5.Leukocytosis-WBC increased to 20.2. Remains afebrile. No signs of wound infection and no GU complaints. Will check CBC in am 6.Supplement potassium 7.CBGs 93/117/101. Pre op HGA1C 6. Will stop accu checks, scheduled Insulin, and he will need further surveillance as an outpatient 8.Remove EPW in am 9.Hold colace today 10.Likely discharge this weekend  ZIMMERMAN,DONIELLE MPA-C 09/04/2012,7:32 AM

## 2012-09-04 NOTE — Progress Notes (Signed)
CARDIAC REHAB PHASE I   PRE:  Rate/Rhythm: 105ST  BP:  Supine:   Sitting: 100/70  Standing:    SaO2: 93%RA  MODE:  Ambulation: 350 ft   POST:  Rate/Rhythem: 119 ST  BP:  Supine:   Sitting: 104/72  Standing:    SaO2: 92%RA 0800-0845 Pt with sternal pain. Requested pain med. Pt encouraged to stand upright during walk. Stooped. Slow steady gait. Walked 350 ft on RA with rolling walker. Had to sit at 175 ft due to back pain. Pt rested at least 5 minutes before returning. To recliner with call bell. Encouraged 2 more walks today. Received pain med from RN.  Jeani Sow

## 2012-09-04 NOTE — Progress Notes (Signed)
Pt. Seen and examined. Agree with the NP/PA-C note as written.  Progressing well. Probably close to discharge. No further suggestions.  Pixie Casino, MD, [facility] Attending Cardiologist [facility]

## 2012-09-04 NOTE — Anesthesia Postprocedure Evaluation (Signed)
  Anesthesia Post-op Note  Patient: [name]  Procedure(s) Performed: Procedure(s) with comments: CORONARY ARTERY BYPASS GRAFTING (CABG) (N/A) - Coronary Artery Bypass Grafting Times Three Using Left Internal Mammary Artery and Left Saphenous leg Vein Harvested Endoscopically INTRAOPERATIVE TRANSESOPHAGEAL ECHOCARDIOGRAM (N/A)  Patient Location:SICU  Anesthesia Type:General  Level of Consciousness: sedated and Patient remains intubated per anesthesia plan  Airway and Oxygen Therapy: Patient remains intubated per anesthesia plan and Patient placed on Ventilator (see vital sign flow sheet for setting)  Post-op Pain: none  Post-op Assessment: Post-op Vital signs reviewed and Patient's Cardiovascular Status Stable  Post-op Vital Signs: stable  Complications: No apparent anesthesia complications

## 2012-09-04 NOTE — Progress Notes (Signed)
[facility]  Subjective: Only complaint is incisional pain. No further SOB. Not requiring supplemental O2.  Objective: Vital signs in last 24 hours: Temp:  [98.4 F (36.9 C)-99.7 F (37.6 C)] 98.7 F (37.1 C) (02/21 0533) Pulse Rate:  [74-107] 101 (02/21 0533) Resp:  [12-22] 20 (02/21 0533) BP: (97-130)/(69-93) 97/77 mmHg (02/21 0533) SpO2:  [92 %-99 %] 92 % (02/21 0533) Weight:  [207 lb 6.4 oz (94.076 kg)] 207 lb 6.4 oz (94.076 kg) (02/21 0533) Last BM Date: 09/03/12  Intake/Output from previous day: 02/20 0701 - 02/21 0700 In: 360 [P.O.:240; I.V.:120] Out: 110 [Urine:110] Intake/Output this shift:    Medications Current Facility-Administered Medications  Medication Dose Route Frequency Provider Last Rate Last Dose  . 0.9 %  sodium chloride infusion  250 mL Intravenous PRN Gaye Pollack, MD      . acetaminophen (TYLENOL) tablet 650 mg  650 mg Oral Q6H PRN Gaye Pollack, MD      . aspirin EC tablet 325 mg  325 mg Oral Daily Gaye Pollack, MD      . bisacodyl (DULCOLAX) EC tablet 10 mg  10 mg Oral Daily PRN Gaye Pollack, MD       Or  . bisacodyl (DULCOLAX) suppository 10 mg  10 mg Rectal Daily PRN Gaye Pollack, MD      . docusate sodium (COLACE) capsule 200 mg  200 mg Oral Daily Gaye Pollack, MD      . enoxaparin (LOVENOX) injection 40 mg  40 mg Subcutaneous QHS Gaye Pollack, MD   40 mg at 09/03/12 2122  . furosemide (LASIX) tablet 40 mg  40 mg Oral Daily Gaye Pollack, MD   40 mg at 09/03/12 1833  . gemfibrozil (LOPID) tablet 600 mg  600 mg Oral BID AC Nani Skillern, PA   600 mg at 09/04/12 0731  . levalbuterol (XOPENEX) nebulizer solution 0.63 mg  0.63 mg Nebulization Q6H PRN Gaye Pollack, MD      . metoprolol tartrate (LOPRESSOR) tablet 25 mg  25 mg Oral BID Gaye Pollack, MD   25 mg at 09/03/12 2122  . ondansetron (ZOFRAN) tablet 4 mg  4 mg Oral Q6H PRN Gaye Pollack, MD       Or  . ondansetron (ZOFRAN) injection 4 mg   4 mg Intravenous Q6H PRN Gaye Pollack, MD      . oxyCODONE (Oxy IR/ROXICODONE) immediate release tablet 5-10 mg  5-10 mg Oral Q3H PRN Gaye Pollack, MD   10 mg at 09/04/12 0227  . pantoprazole (PROTONIX) EC tablet 40 mg  40 mg Oral QAC breakfast Gaye Pollack, MD   40 mg at 09/04/12 0542  . potassium chloride SA (K-DUR,KLOR-CON) CR tablet 20 mEq  20 mEq Oral Once Nani Skillern, PA      . potassium chloride SA (K-DUR,KLOR-CON) CR tablet 20 mEq  20 mEq Oral Daily Donielle Liston Alba, PA      . sodium chloride 0.9 % injection 3 mL  3 mL Intravenous Q12H Gaye Pollack, MD   3 mL at 09/03/12 2200  . sodium chloride 0.9 % injection 3 mL  3 mL Intravenous PRN Gaye Pollack, MD      . traMADol Veatrice Bourbon) tablet 50-100 mg  50-100 mg Oral Q4H PRN Gaye Pollack, MD   100 mg at 09/03/12 2121    PE: General appearance: alert, cooperative and no distress Lungs:  decreased breath sounds at bases Heart: regular rate and rhythm, S1, S2 normal, no murmur, click, rub or gallop Extremities: no LEE Pulses: 2+ and symmetric Skin: warm and dry Neurologic: Grossly normal  Lab Results:   Recent Labs  09/02/12 1644 09/02/12 1722 09/03/12 0415 09/04/12 0510  WBC 20.0*  --  18.1* 20.2*  HGB 13.2 13.6 12.5* 13.2  HCT 38.2* 40.0 37.1* 37.9*  PLT 335  --  290 372   BMET  Recent Labs  09/02/12 0405  09/02/12 1722 09/03/12 0415 09/04/12 0510  NA 139  --  140 139 138  K 3.7  --  4.2 4.1 3.9  CL 106  --  103 101 98  CO2 26  --   --  30 30  GLUCOSE 138*  --  131* 102* 106*  BUN 18  --  21 22 23   CREATININE 1.22  < > 1.50* 1.39* 1.34  CALCIUM 7.7*  --   --  8.3* 8.8  < > = values in this interval not displayed. PT/INR  Recent Labs  09/01/12 1400  LABPROT 16.3*  INR 1.34    Assessment/Plan  Active Problems:   Hypertension   Borderline diabetic   STEMI (ST elevation myocardial infarction)   Hiatal hernia   CAD S/P CABG X 3 (LIMA to LAD, SVG to OM, SVG to RCA) -2/18  Plan: S/P  CABG x 3 (LIMA-LAD, SVG-OM, SVR-RCA) POD#3. Progressing well. Still has incisional pain. SOB has improved, no longer using supplemental O2. He was transferred from SICU to 2000 yesterday. No post-operative arrhythmias noted on telemetry. He is borderline NSR/ sinus tach w/ rates in the low 100s. He is no longer on Amiodarone. He is on Lopressor 25 mg BID, with parameters. Per ordering MD, BB is to be held for SBP <100. Most recent BP was 97/77. H/H is improving. WBC are still elevated and slightly up from yesterday. This is being followed by CT surgery. All other labs are WNL. CXR yesterday showed subsegmental atelectasis of left lung base. He is encouraged to continue using incentive spirometer. Will continue to monitor. MD to follow with further recommendation.     LOS: 4 days    Brittainy M. Ladoris Gene 09/04/2012 7:44 AM

## 2012-09-05 LAB — CBC
Hemoglobin: 12 g/dL — ABNORMAL LOW (ref 13.0–17.0)
MCV: 90.8 fL (ref 78.0–100.0)
Platelets: 365 10*3/uL (ref 150–400)
RBC: 3.82 MIL/uL — ABNORMAL LOW (ref 4.22–5.81)
WBC: 14.1 10*3/uL — ABNORMAL HIGH (ref 4.0–10.5)

## 2012-09-05 MED ORDER — LISINOPRIL 2.5 MG PO TABS
2.5000 mg | ORAL_TABLET | Freq: Every day | ORAL | Status: DC
  Administered 2012-09-05: 2.5 mg via ORAL
  Filled 2012-09-05: qty 1

## 2012-09-05 MED ORDER — ATORVASTATIN CALCIUM 40 MG PO TABS
40.0000 mg | ORAL_TABLET | Freq: Every day | ORAL | Status: DC
  Filled 2012-09-05: qty 1

## 2012-09-05 MED ORDER — LISINOPRIL 2.5 MG PO TABS: 2.5000 mg | ORAL_TABLET | Freq: Every day | ORAL | Status: DC

## 2012-09-05 MED ORDER — ENOXAPARIN SODIUM 40 MG/0.4ML ~~LOC~~ SOLN
40.0000 mg | Freq: Every day | SUBCUTANEOUS | Status: DC
  Filled 2012-09-05: qty 0.4

## 2012-09-05 MED ORDER — FUROSEMIDE 40 MG PO TABS
40.0000 mg | ORAL_TABLET | Freq: Once | ORAL | Status: AC
  Administered 2012-09-05: 40 mg via ORAL
  Filled 2012-09-05: qty 1

## 2012-09-05 MED ORDER — POTASSIUM CHLORIDE CRYS ER 20 MEQ PO TBCR
20.0000 meq | EXTENDED_RELEASE_TABLET | Freq: Once | ORAL | Status: AC
  Administered 2012-09-05: 20 meq via ORAL
  Filled 2012-09-05: qty 1

## 2012-09-05 MED ORDER — ATORVASTATIN CALCIUM 40 MG PO TABS: 40.0000 mg | ORAL_TABLET | Freq: Every day | ORAL | Status: DC

## 2012-09-05 NOTE — Progress Notes (Signed)
CARDIAC REHAB PHASE I   PRE:  Rate/Rhythm: 102 ST  BP:  Supine:   Sitting: 133/93  Standing:    SaO2:   MODE:  Ambulation: 350 ft   POST:  Rate/Rhythem: 113 ST  BP:  Supine:   Sitting: 129/89  Standing:    SaO2: 96%RA [id number]   Education completed with pt. Understanding voiced. Pt would like referral to [place] and [place] Phase 2. Permission given to do so. Pt walked 350 ft with hand held asst after bedrest up. Gait steady. Did not have to rest for back pain this walk. To recliner with call bell after walk. Discussed importance of keeping glucose under control and gave pt heart healthy and diabetic diet. Discussed with pt how diet and exercise and weight loss can decrease progression of diabetes. Pt states he had never been given info on this. Just told he was borderline diabetic.  Jeani Sow

## 2012-09-05 NOTE — Progress Notes (Signed)
Pt on bedrest 1 hr.

## 2012-09-05 NOTE — Progress Notes (Signed)
[facility] Progress Note  Subjective:  No chest pain; day 5 s/p MI and day 4 s/p CABG  Objective:   Vital Signs in the last 24 hours: Temp:  [99.5 F (37.5 C)] 99.5 F (37.5 C) (02/21 2033) Pulse Rate:  [102-103] 103 (02/21 2101) Resp:  [18] 18 (02/21 2033) BP: (117-133)/(77-93) 133/93 mmHg (02/22 0900) SpO2:  [96 %] 96 % (02/21 2033)  Intake/Output from previous day: 02/21 0701 - 02/22 0700 In: 480 [P.O.:480] Out: -   Scheduled: . aspirin EC  325 mg Oral Daily  . docusate sodium  200 mg Oral Daily  . gemfibrozil  600 mg Oral BID AC  . lisinopril  2.5 mg Oral Daily  . metoprolol tartrate  25 mg Oral BID  . pantoprazole  40 mg Oral QAC breakfast  . sodium chloride  3 mL Intravenous Q12H     Physical Exam:   General appearance: alert, cooperative and no distress Neck: no carotid bruit, no JVD and supple, symmetrical, trachea midline Lungs: clear to auscultation bilaterally Heart: regular rate and rhythm Abdomen: soft, non-tender; bowel sounds normal; no masses,  no organomegaly Extremities: trace edema   Rate: 90's  Rhythm: normal sinus rhythm  Lab Results:    Recent Labs  09/03/12 0415 09/04/12 0510  NA 139 138  K 4.1 3.9  CL 101 98  CO2 30 30  GLUCOSE 102* 106*  BUN 22 23  CREATININE 1.39* 1.34   No results found for this basename: TROPONINI, CK, MB,  in the last 72 hours Hepatic Function Panel No results found for this basename: PROT, ALBUMIN, AST, ALT, ALKPHOS, BILITOT, BILIDIR, IBILI,  in the last 72 hours No results found for this basename: INR,  in the last 72 hours  Lipid Panel     Component Value Date/Time   CHOL 209* 08/31/2012 2338   TRIG 214* 08/31/2012 2338   HDL 30* 08/31/2012 2338   CHOLHDL 7.0 08/31/2012 2338   VLDL 43* 08/31/2012 2338   LDLCALC 136* 08/31/2012 2338     Imaging:  No results found.    Assessment/Plan:   Active Problems:   Hypertension   Borderline diabetic   STEMI (ST  elevation myocardial infarction)   Hiatal hernia   CAD S/P CABG X 3 (LIMA to LAD, SVG to OM, SVG to RCA) -2/18   Day 4 s/p CABG. Recommend DC gemfibrozil and change to statin; may need combination treatment for atherogenic dyslipidemic pattern. Cardiac rehab post dc.   Troy Sine, MD, [facility] 09/05/2012, 11:07 AM

## 2012-09-05 NOTE — Progress Notes (Signed)
                   [address]            [place] [address]          [phone]      4 Days Post-Op Procedure(s) (LRB): CORONARY ARTERY BYPASS GRAFTING (CABG) (N/A) INTRAOPERATIVE TRANSESOPHAGEAL ECHOCARDIOGRAM (N/A)  Subjective: Patient not sleeping well. Otherwise, no complaints. He wants to go home.  Objective: Vital signs in last 24 hours: Temp:  [99.5 F (37.5 C)] 99.5 F (37.5 C) (02/21 2033) Pulse Rate:  [96-103] 103 (02/21 2101) Cardiac Rhythm:  [-] Normal sinus rhythm (02/21 2113) Resp:  [18] 18 (02/21 2033) BP: (115-118)/(77-82) 118/78 mmHg (02/21 2101) SpO2:  [96 %] 96 % (02/21 2033)  Pre op weight  95 kg Current Weight  09/04/12 94.076 kg (207 lb 6.4 oz)      Intake/Output from previous day: 02/21 0701 - 02/22 0700 In: 480 [P.O.:480] Out: -    Physical Exam:  Cardiovascular: Slightly tachy; no murmurs, gallops, or rubs. Pulmonary: Slightly diminished at bases; no rales, wheezes, or rhonchi. Abdomen: Soft, non tender, bowel sounds present. Extremities: No lower extremity edema. Wounds: Clean and dry.  No erythema or signs of infection.  Lab Results: CBC:  Recent Labs  09/04/12 0510 09/05/12 0610  WBC 20.2* 14.1*  HGB 13.2 12.0*  HCT 37.9* 34.7*  PLT 372 365   BMET:   Recent Labs  09/03/12 0415 09/04/12 0510  NA 139 138  K 4.1 3.9  CL 101 98  CO2 30 30  GLUCOSE 102* 106*  BUN 22 23  CREATININE 1.39* 1.34  CALCIUM 8.3* 8.8    PT/INR:  Lab Results  Component Value Date   INR 1.34 09/01/2012   INR 0.99 08/31/2012   INR 1.04 08/31/2012   ABG:  INR: Will add last result for INR, ABG once components are confirmed Will add last 4 CBG results once components are confirmed  Assessment/Plan:  1. CV - ST/SR. On Lopressor 25 bid.  2.  Pulmonary - Encourage incentive spirometer. 3. Volume Overload - On Lasix 40 daily. Below pre op weight and only trace LE edema. Will stop after dose this am 4.  Mild Acute blood  loss anemia - H and H stable at 13.2 and 37.9 5.Leukocytosis-WBC decreased from 20.2 to 14,100. Remains afebrile. No signs of wound infection and no GU complaints. 8.Remove EPW and sutures 9.Will discuss discharge disposition with surgeon   Ranjit Ashurst MPA-C 09/05/2012,7:47 AM

## 2012-09-05 NOTE — Progress Notes (Signed)
EPW's /CTS removed without difficulty.  BP 134/88 ST 102

## 2012-09-09 ENCOUNTER — Telehealth: Payer: Self-pay | Admitting: *Deleted

## 2012-09-09 NOTE — Telephone Encounter (Signed)
[name] called to say that her husband has developed a cough since discharge from the hospital after CABG. When questioned, it is not related to a cold or shortness of breath.  I have referred her to his cardiologist to see if the cough is medication related.  She agrees.

## 2012-09-11 ENCOUNTER — Encounter (HOSPITAL_COMMUNITY): Payer: Self-pay

## 2012-09-11 ENCOUNTER — Emergency Department (HOSPITAL_COMMUNITY)
Admission: EM | Admit: 2012-09-11 | Discharge: 2012-09-11 | Disposition: A | Discharge status: Home / Self Care | Payer: BC Managed Care – PPO | Attending: Emergency Medicine | Admitting: Emergency Medicine

## 2012-09-11 ENCOUNTER — Emergency Department (HOSPITAL_COMMUNITY): Payer: BC Managed Care – PPO

## 2012-09-11 DIAGNOSIS — I252 Old myocardial infarction: Secondary | ICD-10-CM | POA: Insufficient documentation

## 2012-09-11 DIAGNOSIS — D649 Anemia, unspecified: Secondary | ICD-10-CM | POA: Insufficient documentation

## 2012-09-11 DIAGNOSIS — R05 Cough: Secondary | ICD-10-CM

## 2012-09-11 DIAGNOSIS — E119 Type 2 diabetes mellitus without complications: Secondary | ICD-10-CM | POA: Insufficient documentation

## 2012-09-11 DIAGNOSIS — Z8673 Personal history of transient ischemic attack (TIA), and cerebral infarction without residual deficits: Secondary | ICD-10-CM | POA: Insufficient documentation

## 2012-09-11 DIAGNOSIS — I2510 Atherosclerotic heart disease of native coronary artery without angina pectoris: Secondary | ICD-10-CM | POA: Insufficient documentation

## 2012-09-11 DIAGNOSIS — R059 Cough, unspecified: Secondary | ICD-10-CM | POA: Insufficient documentation

## 2012-09-11 DIAGNOSIS — I10 Essential (primary) hypertension: Secondary | ICD-10-CM | POA: Insufficient documentation

## 2012-09-11 DIAGNOSIS — K449 Diaphragmatic hernia without obstruction or gangrene: Secondary | ICD-10-CM | POA: Insufficient documentation

## 2012-09-11 DIAGNOSIS — Z7982 Long term (current) use of aspirin: Secondary | ICD-10-CM | POA: Insufficient documentation

## 2012-09-11 DIAGNOSIS — Z951 Presence of aortocoronary bypass graft: Secondary | ICD-10-CM | POA: Insufficient documentation

## 2012-09-11 DIAGNOSIS — Z79899 Other long term (current) drug therapy: Secondary | ICD-10-CM | POA: Insufficient documentation

## 2012-09-11 DIAGNOSIS — D72829 Elevated white blood cell count, unspecified: Secondary | ICD-10-CM | POA: Insufficient documentation

## 2012-09-11 DIAGNOSIS — R55 Syncope and collapse: Secondary | ICD-10-CM | POA: Insufficient documentation

## 2012-09-11 LAB — BASIC METABOLIC PANEL
CO2: 24 mEq/L (ref 19–32)
Chloride: 100 mEq/L (ref 96–112)
Glucose, Bld: 118 mg/dL — ABNORMAL HIGH (ref 70–99)
Potassium: 3.9 mEq/L (ref 3.5–5.1)
Sodium: 135 mEq/L (ref 135–145)

## 2012-09-11 LAB — CBC WITH DIFFERENTIAL/PLATELET
Eosinophils Relative: 1 % (ref 0–5)
HCT: 34.1 % — ABNORMAL LOW (ref 39.0–52.0)
Hemoglobin: 12 g/dL — ABNORMAL LOW (ref 13.0–17.0)
Lymphocytes Relative: 14 % (ref 12–46)
MCV: 89 fL (ref 78.0–100.0)
Monocytes Absolute: 1.4 10*3/uL — ABNORMAL HIGH (ref 0.1–1.0)
Monocytes Relative: 8 % (ref 3–12)
Neutro Abs: 13.9 10*3/uL — ABNORMAL HIGH (ref 1.7–7.7)
WBC: 18.2 10*3/uL — ABNORMAL HIGH (ref 4.0–10.5)

## 2012-09-11 MED ORDER — BENZONATATE 100 MG PO CAPS: 100.0000 mg | ORAL_CAPSULE | Freq: Three times a day (TID) | ORAL | Status: DC

## 2012-09-11 MED ORDER — BENZONATATE 100 MG PO CAPS
200.0000 mg | ORAL_CAPSULE | Freq: Three times a day (TID) | ORAL | Status: DC | PRN
  Administered 2012-09-11 (×2): 200 mg via ORAL
  Filled 2012-09-11 (×3): qty 2

## 2012-09-11 NOTE — H&P (Signed)
[name] is an 57 y.o. male.   Chief Complaint:  Cough/syncope HPI:   The patient is a 57 yo caucasian male who was just discharged on 2/22 after having a STEMI and subsequent CABG x 3 (LIMA to LAD, SVG to OM, SVG to RCA).  His history also includes HTN, prediabetes, mini stroke, dyslipidemia, and hiatal hernia.  He reports having a tickle in the back of his throat that triggers a cough which can be sustained and result in syncope.  He has had three syncopal episodes each on a different day and each while sitting down.  His wife reports that he only appears to pass out for a split second.  He denies N, V, fever, SOB, orthopnea, PND, LEE.  He describes CP in relation to the soreness after surgery.  No progression.  ACE-I was DCd.  Medications: Prior to Admission medications   Medication Sig Start Date End Date Taking? Authorizing Provider  aspirin EC 325 MG EC tablet Take 1 tablet (325 mg total) by mouth daily. 09/04/12  Yes Donielle Liston Alba, PA  atorvastatin (LIPITOR) 40 MG tablet Take 1 tablet (40 mg total) by mouth daily at 6 PM. 09/05/12  Yes Donielle Liston Alba, PA  lisinopril (PRINIVIL,ZESTRIL) 2.5 MG tablet Take 1 tablet (2.5 mg total) by mouth daily. 09/05/12  Yes Donielle Liston Alba, PA  metoprolol tartrate (LOPRESSOR) 25 MG tablet Take 1 tablet (25 mg total) by mouth 2 (two) times daily. 09/04/12  Yes Donielle Liston Alba, PA  oxyCODONE (OXY IR/ROXICODONE) 5 MG immediate release tablet Take 1-2 tablets (5-10 mg total) by mouth every 4 (four) hours as needed for pain. 09/04/12  Yes Nani Skillern, PA       Past Medical History  Diagnosis Date  . Hypertension   . Borderline diabetic   . Mini stroke     2  . Hiatal hernia   . STEMI (ST elevation myocardial infarction)     Past Surgical History  Procedure Laterality Date  . Coronary artery bypass graft N/A 09/01/2012    Procedure: CORONARY ARTERY BYPASS GRAFTING (CABG);  Surgeon: Gaye Pollack, MD;  Location: [facility];   Service: Open Heart Surgery;  Laterality: N/A;  Coronary Artery Bypass Grafting Times Three Using Left Internal Mammary Artery and Left Saphenous leg Vein Harvested Endoscopically  . Intraoperative transesophageal echocardiogram N/A 09/01/2012    Procedure: INTRAOPERATIVE TRANSESOPHAGEAL ECHOCARDIOGRAM;  Surgeon: Gaye Pollack, MD;  Location: [facility];  Service: Open Heart Surgery;  Laterality: N/A;    No family history on file. Social History:  reports that he has never smoked. He does not have any smokeless tobacco history on file. He reports that he does not drink alcohol or use illicit drugs.  Allergies: No Known Allergies   (Not in a hospital admission)  Results for orders placed during the hospital encounter of 09/11/12 (from the past 48 hour(s))  CBC WITH DIFFERENTIAL     Status: Abnormal   Collection Time    09/11/12  4:11 AM      Result Value Range   WBC 18.2 (*) 4.0 - 10.5 K/uL   RBC 3.83 (*) 4.22 - 5.81 MIL/uL   Hemoglobin 12.0 (*) 13.0 - 17.0 g/dL   HCT 34.1 (*) 39.0 - 52.0 %   MCV 89.0  78.0 - 100.0 fL   MCH 31.3  26.0 - 34.0 pg   MCHC 35.2  30.0 - 36.0 g/dL   RDW 12.3  11.5 - 15.5 %  Platelets 564 (*) 150 - 400 K/uL   Neutrophils Relative 76  43 - 77 %   Neutro Abs 13.9 (*) 1.7 - 7.7 K/uL   Lymphocytes Relative 14  12 - 46 %   Lymphs Abs 2.6  0.7 - 4.0 K/uL   Monocytes Relative 8  3 - 12 %   Monocytes Absolute 1.4 (*) 0.1 - 1.0 K/uL   Eosinophils Relative 1  0 - 5 %   Eosinophils Absolute 0.2  0.0 - 0.7 K/uL   Basophils Relative 0  0 - 1 %   Basophils Absolute 0.1  0.0 - 0.1 K/uL  TROPONIN I     Status: None   Collection Time    09/11/12  4:11 AM      Result Value Range   Troponin I <0.30  <0.30 ng/mL   Comment:            Due to the release kinetics of cTnI,     a negative result within the first hours     of the onset of symptoms does not rule out     myocardial infarction with certainty.     If myocardial infarction is still suspected,     repeat the  test at appropriate intervals.  PRO B NATRIURETIC PEPTIDE     Status: Abnormal   Collection Time    09/11/12  4:11 AM      Result Value Range   Pro B Natriuretic peptide (BNP) 3676.0 (*) 0 - 125 pg/mL  BASIC METABOLIC PANEL     Status: Abnormal   Collection Time    09/11/12  4:11 AM      Result Value Range   Sodium 135  135 - 145 mEq/L   Potassium 3.9  3.5 - 5.1 mEq/L   Chloride 100  96 - 112 mEq/L   CO2 24  19 - 32 mEq/L   Glucose, Bld 118 (*) 70 - 99 mg/dL   BUN 21  6 - 23 mg/dL   Creatinine, Ser 1.27  0.50 - 1.35 mg/dL   Calcium 9.1  8.4 - 10.5 mg/dL   GFR calc non Af Amer 61 (*) >90 mL/min   GFR calc Af Amer 70 (*) >90 mL/min   Comment:            The eGFR has been calculated     using the CKD EPI equation.     This calculation has not been     validated in all clinical     situations.     eGFR's persistently     <90 mL/min signify     possible Chronic Kidney Disease.   Dg Chest 2 View  09/11/2012  *RADIOLOGY REPORT*  Clinical Data: Cough.  Recent CABG.  Hypertension.  CHEST - 2 VIEW  Comparison: 09/03/2012  Findings: Stable appearance of mediastinal postoperative changes. Shallow inspiration.  Heart size and pulmonary vascularity are normal for technique. Mild residual atelectasis in the left lung base.  No focal consolidation or airspace disease demonstrated in the lungs.  No blunting of costophrenic angles.  No pneumothorax. No significant change since previous study.  IMPRESSION: Shallow inspiration with atelectasis in the left lung base.  Stable appearance since previous study.  No developing consolidation.   Original Report Authenticated By: Lucienne Capers, M.D.     Review of Systems  Constitutional: Negative for fever and diaphoresis.  HENT: Positive for sore throat. Negative for congestion.   Respiratory: Positive for cough. Negative for shortness of  breath and wheezing.   Cardiovascular: Positive for chest pain (Sore after surgery). Negative for orthopnea, leg  swelling and PND.  Gastrointestinal: Negative for nausea, vomiting and abdominal pain.  Neurological: Negative for dizziness.    Blood pressure 114/77, pulse 91, temperature 98.7 F (37.1 C), temperature source Oral, resp. rate 24, SpO2 95.00%. Physical Exam  Constitutional: He is oriented to person, place, and time. He appears well-developed and well-nourished. No distress.  HENT:  Head: Normocephalic and atraumatic.  Mouth/Throat: No oropharyngeal exudate.  Eyes: EOM are normal. Pupils are equal, round, and reactive to light. No scleral icterus.  Neck: Normal range of motion. Neck supple.  Cardiovascular: Normal rate, regular rhythm, S1 normal and S2 normal.  Exam reveals no gallop.   No murmur heard. Pulses:      Radial pulses are 2+ on the right side, and 2+ on the left side.       Dorsalis pedis pulses are 1+ on the right side, and 1+ on the left side.  No Carotid Bruits.   Respiratory: Effort normal. He has no wheezes. He has no rales.  BS decreased at bases.   GI: Soft. Bowel sounds are normal. He exhibits no distension.  Musculoskeletal: He exhibits no edema.  Lymphadenopathy:    He has no cervical adenopathy.  Neurological: He is alert and oriented to person, place, and time. He exhibits normal muscle tone.  Skin: Skin is warm and dry.  Psychiatric: He has a normal mood and affect.     Assessment/Plan 1. Cough induced Syncope  2. CAD  SP (CABG) x 3 (LIMA to LAD, SVG to OM, SVG to RCA, 09/01/12. 3. Dyslipidemia 4. HTN 5. Prediabetes 6. Hiatal hernia 7. Leukocytosis 8. Anemia, mild  Plan:  A limited echo has been ordered to look for pericardial effusion.  WBC are elevated as well as BNP, however, he does not describe heart failure symptoms(SOB, orthopnea, LEE).  He does have diffuse TWI on EKG which is new compared to prior.  BP is controlled and stable.  CXR stable compared to prior.  Ethell Blatchford 09/11/2012, 8:01 AM

## 2012-09-11 NOTE — ED Provider Notes (Signed)
History     CSN: [name]:[id number]  Arrival date & time 09/11/12  [id number]   First MD Initiated Contact with Patient 09/11/12 0330      Chief Complaint  Patient presents with  . Shortness of Breath    (Consider location/radiation/quality/duration/timing/severity/associated sxs/prior treatment) HPI 57 year old male presents emergency department complaining of ongoing cough. Patient and wife reports that he is coughing to the point of passing out. Wife reports he's had his third episode of cough-induced syncope tonight. Patient is status post CABG on the 18th. He was discharged from the hospital on Saturday. He reports he has been having ongoing cough since the surgery. They called his doctor yesterday, who took him off lisinopril as possible cause. Patient reports cough is due to a tickle in the back of his throat. He reports the cough is dry. He denies any shortness of breath or chest pain aside from postsurgical pain. Patient is nonsmoker. Patient has tried throat lozenges and Robitussin without improvement in symptoms. Past Medical History  Diagnosis Date  . Hypertension   . Borderline diabetic   . Mini stroke     2  . Hiatal hernia   . STEMI (ST elevation myocardial infarction)     Past Surgical History  Procedure Laterality Date  . Coronary artery bypass graft N/A 09/01/2012    Procedure: CORONARY ARTERY BYPASS GRAFTING (CABG);  Surgeon: Gaye Pollack, MD;  Location: [place];  Service: Open Heart Surgery;  Laterality: N/A;  Coronary Artery Bypass Grafting Times Three Using Left Internal Mammary Artery and Left Saphenous leg Vein Harvested Endoscopically  . Intraoperative transesophageal echocardiogram N/A 09/01/2012    Procedure: INTRAOPERATIVE TRANSESOPHAGEAL ECHOCARDIOGRAM;  Surgeon: Gaye Pollack, MD;  Location[facility];  Service: Open Heart Surgery;  Laterality: N/A;    No family history on file.  History  Substance Use Topics  . Smoking status: Never Smoker   . Smokeless tobacco:  Not on file  . Alcohol Use: No      Review of Systems  All other systems reviewed and are negative.    Allergies  Review of patient's allergies indicates no known allergies.  Home Medications   Current Outpatient Rx  Name  Route  Sig  Dispense  Refill  . aspirin EC 325 MG EC tablet   Oral   Take 1 tablet (325 mg total) by mouth daily.   30 tablet      . atorvastatin (LIPITOR) 40 MG tablet   Oral   Take 1 tablet (40 mg total) by mouth daily at 6 PM.   30 tablet   1   . lisinopril (PRINIVIL,ZESTRIL) 2.5 MG tablet   Oral   Take 1 tablet (2.5 mg total) by mouth daily.   30 tablet   1   . metoprolol tartrate (LOPRESSOR) 25 MG tablet   Oral   Take 1 tablet (25 mg total) by mouth 2 (two) times daily.   60 tablet   1   . oxyCODONE (OXY IR/ROXICODONE) 5 MG immediate release tablet   Oral   Take 1-2 tablets (5-10 mg total) by mouth every 4 (four) hours as needed for pain.   40 tablet   0     BP 127/89  Pulse 88  Temp(Src) 98.7 F (37.1 C) (Oral)  SpO2 93%  Physical Exam  Nursing note and vitals reviewed. Constitutional: He is oriented to person, place, and time. He appears well-developed and well-nourished.  HENT:  Head: Normocephalic and atraumatic.  Nose: Nose normal.  Mouth/Throat: Oropharynx is clear and moist.  Eyes: Conjunctivae and EOM are normal. Pupils are equal, round, and reactive to light.  Neck: Normal range of motion. Neck supple. No JVD present. No tracheal deviation present. No thyromegaly present.  Cardiovascular: Normal rate, regular rhythm, normal heart sounds and intact distal pulses.  Exam reveals no gallop and no friction rub.   No murmur heard. Pulmonary/Chest: Effort normal. No stridor. No respiratory distress. He has no wheezes. He has rales (mild in bases bilaterally). He exhibits no tenderness.  Surgical incision healing well no signs of skin breakdown or infection  Abdominal: Soft. Bowel sounds are normal. He exhibits no  distension and no mass. There is no tenderness. There is no rebound and no guarding.  Musculoskeletal: Normal range of motion. He exhibits no edema and no tenderness.  Lymphadenopathy:    He has no cervical adenopathy.  Neurological: He is alert and oriented to person, place, and time. He exhibits normal muscle tone. Coordination normal.  Skin: Skin is warm and dry. No rash noted. No erythema. No pallor.  Psychiatric: He has a normal mood and affect. His behavior is normal. Judgment and thought content normal.    ED Course  Procedures (including critical care time)  Labs Reviewed  CBC WITH DIFFERENTIAL - Abnormal; Notable for the following:    WBC 18.2 (*)    RBC 3.83 (*)    Hemoglobin 12.0 (*)    HCT 34.1 (*)    Platelets 564 (*)    Neutro Abs 13.9 (*)    Monocytes Absolute 1.4 (*)    All other components within normal limits  PRO B NATRIURETIC PEPTIDE - Abnormal; Notable for the following:    Pro B Natriuretic peptide (BNP) 3676.0 (*)    All other components within normal limits  BASIC METABOLIC PANEL - Abnormal; Notable for the following:    Glucose, Bld 118 (*)    GFR calc non Af Amer 61 (*)    GFR calc Af Amer 70 (*)    All other components within normal limits  TROPONIN I   Dg Chest 2 View  09/11/2012  *RADIOLOGY REPORT*  Clinical Data: Cough.  Recent CABG.  Hypertension.  CHEST - 2 VIEW  Comparison: 09/03/2012  Findings: Stable appearance of mediastinal postoperative changes. Shallow inspiration.  Heart size and pulmonary vascularity are normal for technique. Mild residual atelectasis in the left lung base.  No focal consolidation or airspace disease demonstrated in the lungs.  No blunting of costophrenic angles.  No pneumothorax. No significant change since previous study.  IMPRESSION: Shallow inspiration with atelectasis in the left lung base.  Stable appearance since previous study.  No developing consolidation.   Original Report Authenticated By: Lucienne Capers, M.D.       Date: 09/11/2012  Rate: 89  Rhythm: normal sinus rhythm  QRS Axis: normal  Intervals: normal  ST/T Wave abnormalities: normal  Conduction Disutrbances:none  Narrative Interpretation:   Old EKG Reviewed: unchanged   1. Cough syncope       MDM  57 year old male 10 days postop from CABG with persistent cough. Causes may be secondary to ET tube irritation, lisinopril, or concerning causes such as developing CHF, pneumomediastinum. Will check chest x-ray, lab work, will give Tussionex. Will discuss with his cardiologist.  6:58 AM Workup shows slightly low 02 saturation during ambulation.  D/w oncall [facility] cardiologist who requests limited echo, for possible pericardial effusion.  They will see patient in the ED.  Kalman Drape, MD 09/11/12 [phone]

## 2012-09-11 NOTE — H&P (Addendum)
Pt. Seen and examined. Agree with the NP/PA-C note as written.  Well known from recent CABG. Called the office 2 days ago about cough - I discontinued ACE-I.  Was very insidious today with paroxysms of cough and he developed "momentary" cough syncope.  He has responded well to tessalon perles in the ER. Echo was reviewed by myself and there is no pericardial effusion, EF 45% with lateral akinesis and incoordinate (post-operative) septal motion. EKG does show diffuse lateral TW flattening due to probably infarct.  There is still a persistently elevated WBC count. He denies fevers. I don't think there is a pneumonia, however, if the cough persists or he has productive sputum, I would recommend a short course of antibiotics for ?atypical pneumonia.  [place] for discharge home with tessalon. No ACE-I or ARB. Follow-up with mid-level provider in 5-7 days in our office.   Pixie Casino, MD, [facility] Attending Cardiologist [facility]

## 2012-09-11 NOTE — ED Provider Notes (Signed)
Patient signed out to me by Dr. Sharol Given to followup on recommendations by the patient's cardiology service. Patient had cough associated syncope and was waiting cardiac echo for further evaluation. Dr. Debara Pickett has evaluated the patient in the ER and reviewed the patient's echo. He reports that the echo was similar to previous without any change. He was recommended the patient be taken off the lisinopril which is likely causing the cough. Patient can be discharged home and followed up in the office.  Orpah Greek, MD 09/11/12 [phone]

## 2012-09-11 NOTE — ED Notes (Signed)
Pulse ox dropped to high 80's and low 90s while ambulating. Pt. Denied dizziness, lightheadedness, SOB.

## 2012-09-11 NOTE — ED Notes (Signed)
Dr Otter at bedside  

## 2012-09-11 NOTE — Progress Notes (Signed)
  Echocardiogram 2D Echocardiogram has been performed.  Cicilia Clinger 09/11/2012, 8:59 AM

## 2012-09-11 NOTE — ED Notes (Signed)
Pt. C/o "coughing to the point of passing out". Had CABG x3 and was discharged from hospital on Saturday. States coughing has gotten worse and throat is dry.

## 2012-09-11 NOTE — ED Notes (Signed)
Pt. Reports increased cough since discharged home Saturday after CABG x3. States "my mouth is really dry and I can't seem to catch my breath when I cough".

## 2012-09-28 ENCOUNTER — Ambulatory Visit (INDEPENDENT_AMBULATORY_CARE_PROVIDER_SITE_OTHER): Payer: Self-pay | Admitting: Surgery

## 2012-09-28 ENCOUNTER — Ambulatory Visit
Admission: RE | Admit: 2012-09-28 | Discharge: 2012-09-28 | Disposition: A | Discharge status: Home / Self Care | Payer: BC Managed Care – PPO | Source: Ambulatory Visit | Attending: Surgery | Admitting: Surgery

## 2012-09-28 ENCOUNTER — Encounter: Payer: Self-pay | Admitting: Surgery

## 2012-09-28 ENCOUNTER — Other Ambulatory Visit: Payer: Self-pay | Admitting: *Deleted

## 2012-09-28 VITALS — BP 114/83 | HR 66 | Resp 18 | Ht 68.0 in | Wt 195.0 lb

## 2012-09-28 DIAGNOSIS — Z951 Presence of aortocoronary bypass graft: Secondary | ICD-10-CM

## 2012-09-28 DIAGNOSIS — I2510 Atherosclerotic heart disease of native coronary artery without angina pectoris: Secondary | ICD-10-CM

## 2012-09-28 NOTE — Progress Notes (Signed)
    [address]       [place] [address]             [phone]      HPI: Patient returns for routine postoperative follow-up having undergone bypass graft surgery x3 on 09/01/2012. The patient's early postoperative recovery while in the hospital was notable for an uncomplicated postoperative course. Since hospital discharge the patient reports he is been making good progress. He is walking daily without chest pain or shortness of breath..   Current Outpatient Prescriptions  Medication Sig Dispense Refill  . aspirin EC 325 MG EC tablet Take 1 tablet (325 mg total) by mouth daily.  30 tablet    . atorvastatin (LIPITOR) 40 MG tablet Take 1 tablet (40 mg total) by mouth daily at 6 PM.  30 tablet  1  . escitalopram (LEXAPRO) 10 MG tablet Take 10 mg by mouth daily.      . metoprolol tartrate (LOPRESSOR) 25 MG tablet Take 1 tablet (25 mg total) by mouth 2 (two) times daily.  60 tablet  1   No current facility-administered medications for this visit.    Physical Exam: BP 114/83  Pulse 66  Resp 18  Ht 5\' 8"  (1.727 m)  Wt 195 lb (88.451 kg)  BMI 29.66 kg/m2  SpO2 98% He looks well. Cardiac exam shows a regular rate and rhythm with normal heart sounds. Lung exam is clear. The chest incision is healing well and sternum is stable. Both leg incisions are healing well and there is no peripheral edema.  Diagnostic Tests:  *RADIOLOGY REPORT*   Clinical Data: Follow up CABG, weakness   CHEST - 2 VIEW   Comparison: Chest x-ray of 09/11/2012   Findings: Only small pleural effusions are present. Aeration of the lungs has improved in the interval.  The heart is mildly enlarged and stable.  Median sternotomy sutures are noted.  There are degenerative changes in the mid to lower thoracic spine.   IMPRESSION: Improved aeration.  Only small pleural effusions remain.     Original Report Authenticated By: Ivar Drape, M.D.     Impression:  He is doing well  following coronary bypass surgery. 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  Plan:  He will followup with Dr. Sallyanne Kuster and will contact me if he develops any problems with his incisions.

## 2012-09-29 ENCOUNTER — Ambulatory Visit: Payer: BC Managed Care – PPO | Admitting: Surgery

## 2012-09-30 ENCOUNTER — Ambulatory Visit: Payer: BC Managed Care – PPO | Admitting: Surgery

## 2012-10-18 ENCOUNTER — Encounter: Payer: Self-pay | Admitting: *Deleted

## 2012-10-19 ENCOUNTER — Encounter: Payer: Self-pay | Admitting: Cardiovascular Disease

## 2012-12-28 ENCOUNTER — Other Ambulatory Visit: Payer: Self-pay | Admitting: Family Medicine

## 2012-12-28 DIAGNOSIS — R413 Other amnesia: Secondary | ICD-10-CM

## 2012-12-30 ENCOUNTER — Ambulatory Visit
Admission: RE | Admit: 2012-12-30 | Discharge: 2012-12-30 | Disposition: A | Discharge status: Home / Self Care | Payer: BC Managed Care – PPO | Source: Ambulatory Visit | Attending: Family Medicine | Admitting: Family Medicine

## 2012-12-30 DIAGNOSIS — R413 Other amnesia: Secondary | ICD-10-CM

## 2013-01-08 ENCOUNTER — Telehealth: Payer: Self-pay | Admitting: Cardiovascular Disease

## 2013-01-08 NOTE — Telephone Encounter (Signed)
Per Zigmund Daniel, scheduler, pt called back and appt scheduled.

## 2013-01-08 NOTE — Telephone Encounter (Signed)
Patient needs note to return to work 01/12/2013.

## 2013-01-08 NOTE — Telephone Encounter (Signed)
Returned call.  Left message to call back before 4pm.  PT NEEDS AN APPT W/ DR. C OR EXTENDER FOR EVALUATION.

## 2013-01-11 ENCOUNTER — Encounter: Payer: Self-pay | Admitting: Cardiology

## 2013-01-11 ENCOUNTER — Ambulatory Visit (INDEPENDENT_AMBULATORY_CARE_PROVIDER_SITE_OTHER): Payer: BC Managed Care – PPO | Admitting: Cardiology

## 2013-01-11 VITALS — BP 138/80 | HR 50 | Ht 68.0 in | Wt 209.0 lb

## 2013-01-11 DIAGNOSIS — I2510 Atherosclerotic heart disease of native coronary artery without angina pectoris: Secondary | ICD-10-CM

## 2013-01-11 DIAGNOSIS — I219 Acute myocardial infarction, unspecified: Secondary | ICD-10-CM

## 2013-01-11 DIAGNOSIS — I213 ST elevation (STEMI) myocardial infarction of unspecified site: Secondary | ICD-10-CM

## 2013-01-11 DIAGNOSIS — R5381 Other malaise: Secondary | ICD-10-CM

## 2013-01-11 DIAGNOSIS — E785 Hyperlipidemia, unspecified: Secondary | ICD-10-CM

## 2013-01-11 DIAGNOSIS — R531 Weakness: Secondary | ICD-10-CM

## 2013-01-11 HISTORY — DX: Hyperlipidemia, unspecified: E78.5

## 2013-01-11 MED ORDER — LISINOPRIL 5 MG PO TABS: 5.0000 mg | ORAL_TABLET | Freq: Every day | ORAL | Status: DC

## 2013-01-11 NOTE — Assessment & Plan Note (Addendum)
Very tired with little activity, HR here 50 with walking in the door.  We will back off BB to 12.5 BID and add ACE for BP. Will recheck basic metabolic panel in one week to evaluate creatinine after starting lisinopril.

## 2013-01-11 NOTE — Assessment & Plan Note (Signed)
Improved, but still having to stop at frequent intervals to rest when doing similar type work in his garage.  Not yet ready to return to work with heavy lifting and extreme temps. Will keep out 1 month and have him see Dr.Croitoru in 2 -3 weeks.

## 2013-01-11 NOTE — Assessment & Plan Note (Signed)
Will check cholesterol panel next week fasting to ensure cholesterol stable with current medication. Also with recent complaints of memory loss if his labs are stable perhaps he could have a statin holiday to see if his memory would improve at all.

## 2013-01-11 NOTE — Patient Instructions (Signed)
No work until 02/12/13 IF approved by Dr. Sallyanne Kuster.  Decrease Lopressor to 12.5 mg twice a day.  Half of 25 mg tab twice a day.   Add Lisinopril 5 mg daily for BP control with backing off Lopressor.  See Dr. Sallyanne Kuster back in 2-3 weeks for work clearance.

## 2013-01-11 NOTE — Assessment & Plan Note (Signed)
In 08/2012 followed by CABG

## 2013-01-11 NOTE — Progress Notes (Signed)
01/11/2013   PCP: [name], MD   Chief Complaint  Patient presents with  . Follow-up    been out of work for 5 months, had a heart attack in [place], having problem with back in the spinal card area.    Primary Cardiologist: Dr. Sallyanne Kuster  HPI:  57 year old white married male presents today 5 months status post bypass grafting to discuss returning to work.  He has a history of acute ST elevation MI an urgent cardiac catheterization from February 2014 revealed total occlusion of the proximal left circumflex and high-grade stenosis in both the LAD and RCA. He underwent emergency angioplasty without stenting of the left circumflex 08/31/2012 and subsequently underwent bypass grafting on 09/29/2012 by Dr. Cyndia Bent. He had LIMA to the LAD and vein grafts to the RCA and oblique marginal branch of the left circumflex artery.    Other history includes hypertension and has had 2 previous mini strokes and borderline diabetes mellitus. He also had a borderline evidence of kidney dysfunction with a creatinine of 1.3 and had not been placed on an ACE inhibitor. Lipid profile revealed evidence of fairly mild hypercholesterolemia as well as mild hypertriglycerides.  He was recently evaluated by Dr. [name] for memory loss CT of the head was done with intracranial atherosclerotic disease generalized atrophy but no acute abnormality.  Patient denies chest pain or shortness of breath. 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 At home he has been able to stop frequently and rest. He does complain of feeling very tired, weak most of the time.  While he would like to return to work he is somewhat anxious about returning at this point. He did not do cardiac rehabilitation due to financial issues.  No Known Allergies  Current Outpatient Prescriptions  Medication Sig Dispense  Refill  . aspirin EC 325 MG EC tablet Take 1 tablet (325 mg total) by mouth daily.  30 tablet    . atorvastatin (LIPITOR) 40 MG tablet Take 1 tablet (40 mg total) by mouth daily at 6 PM.  30 tablet  1  . escitalopram (LEXAPRO) 10 MG tablet Take 10 mg by mouth daily.      Marland Kitchen ezetimibe (ZETIA) 10 MG tablet Take 10 mg by mouth daily.      . metoprolol tartrate (LOPRESSOR) 25 MG tablet Take 1 tablet (25 mg total) by mouth 2 (two) times daily.  60 tablet  1  . lisinopril (PRINIVIL,ZESTRIL) 5 MG tablet Take 1 tablet (5 mg total) by mouth daily.  30 tablet  6   No current facility-administered medications for this visit.    Past Medical History  Diagnosis Date  . Hypertension   . Borderline diabetic   . Mini stroke     2  . Hiatal hernia   . STEMI (ST elevation myocardial infarction)   . S/P CABG x 3   . Dyslipidemia 01/11/2013    Past Surgical History  Procedure Laterality Date  . Coronary artery bypass graft N/A 09/01/2012    Procedure: CORONARY ARTERY BYPASS GRAFTING (CABG);  Surgeon: Gaye Pollack, MD;  Location: [place];  Service: Open Heart Surgery;  Laterality: N/A;  Coronary Artery Bypass Grafting Times Three Using Left Internal Mammary Artery and Left Saphenous leg Vein Harvested Endoscopically  . Intraoperative transesophageal echocardiogram N/A 09/01/2012    Procedure: INTRAOPERATIVE TRANSESOPHAGEAL ECHOCARDIOGRAM;  Surgeon: Gaye Pollack, MD;  Location: [place];  Service: Open Heart Surgery;  Laterality: N/A;  . Cardiac catheterization      [id number] colds or fevers, no weight changes Skin:no rashes or ulcers HEENT:no blurred vision, no congestion CV:see HPI, at the end of his chest wall incision which is well-healed he has small hernia that is somewhat discomforting to him  PUL:see HPI GI:no diarrhea constipation or melena, no indigestion GU:no hematuria, no dysuria MS:no joint pain, no claudication, no significant back or neck pain that he had prior to his acute MI.    Neuro:no syncope, no lightheadedness Endo:no diabetes, no thyroid disease Originally patient thought Dr. [name] had drawn cholesterol panel we got labs from her office the cholesterol was not done.  Other labs BUN 20, creatinine 1.13, glucose 88, LFTs normal, TSH 1.750 hemoglobin 16.5 hematocrit 48.1 platelets 378   PHYSICAL EXAM BP 138/80  Pulse 50  Ht 5\' 8"  (1.727 m)  Wt 209 lb (94.802 kg)  BMI 31.79 kg/m2 General:Pleasant affect, NAD Skin:Warm and dry, brisk capillary refill HEENT:normocephalic, sclera clear, mucus membranes moist Neck:supple, no JVD, no bruits  Heart:S1S2 RRR without murmur, gallup, rub or click Lungs:clear without rales, rhonchi, or wheezes [id number], non tender, + BS, do not palpate liver spleen or masses Ext:no lower ext edema, 2+ pedal pulses, 2+ radial pulses Neuro:alert and oriented, MAE, follows commands, + facial symmetry Chest Wall:  Incision well-healed small hernia at the base of the incision    EKG: Sinus bradycardia rate of 50 PR interval 186 ms. No acute changes from prior tracing, the rate is slightly slower.  ASSESSMENT AND PLAN STEMI (ST elevation myocardial infarction) In 08/2012 followed by CABG  CAD S/P CABG X 3 (LIMA to LAD, SVG to OM, SVG to RCA) -2/18 Improved, but still having to stop at frequent intervals to rest when doing similar type work in his garage.  Not yet ready to return to work with heavy lifting and extreme temps. Will keep out 1 month and have him see Dr.Croitoru in 2 -3 weeks.  Weakness Very tired with little activity, HR here 50 with walking in the door.  We will back off BB to 12.5 BID and add ACE for BP. Will recheck basic metabolic panel in one week to evaluate creatinine after starting lisinopril.  Dyslipidemia Will check cholesterol panel next week fasting to ensure cholesterol stable with current medication. Also with recent complaints of memory loss if his labs are stable perhaps he could have a statin holiday to  see if his memory would improve at all.

## 2013-01-29 ENCOUNTER — Telehealth: Payer: Self-pay | Admitting: *Deleted

## 2013-01-29 NOTE — Telephone Encounter (Signed)
LMOM to call to discuss disability papers.

## 2013-02-03 ENCOUNTER — Encounter: Payer: Self-pay | Admitting: Cardiovascular Disease

## 2013-02-03 ENCOUNTER — Ambulatory Visit (INDEPENDENT_AMBULATORY_CARE_PROVIDER_SITE_OTHER): Payer: BC Managed Care – PPO | Admitting: Cardiovascular Disease

## 2013-02-03 VITALS — BP 138/84 | HR 64 | Resp 20 | Ht 68.5 in | Wt 204.7 lb

## 2013-02-03 DIAGNOSIS — Z8673 Personal history of transient ischemic attack (TIA), and cerebral infarction without residual deficits: Secondary | ICD-10-CM

## 2013-02-03 DIAGNOSIS — E785 Hyperlipidemia, unspecified: Secondary | ICD-10-CM

## 2013-02-03 DIAGNOSIS — Z79899 Other long term (current) drug therapy: Secondary | ICD-10-CM

## 2013-02-03 DIAGNOSIS — R7309 Other abnormal glucose: Secondary | ICD-10-CM

## 2013-02-03 DIAGNOSIS — E782 Mixed hyperlipidemia: Secondary | ICD-10-CM

## 2013-02-03 DIAGNOSIS — I10 Essential (primary) hypertension: Secondary | ICD-10-CM

## 2013-02-03 DIAGNOSIS — R7303 Prediabetes: Secondary | ICD-10-CM

## 2013-02-03 DIAGNOSIS — I2510 Atherosclerotic heart disease of native coronary artery without angina pectoris: Secondary | ICD-10-CM

## 2013-02-03 NOTE — Patient Instructions (Addendum)
Your physician recommends that you return for lab work in: At your convenience - [place]  Your physician recommends that you schedule a follow-up appointment in: One year.  Repeat labwork before one year appointment.

## 2013-02-03 NOTE — Assessment & Plan Note (Signed)
Weight loss would be highly beneficial to prevent the development of full-blown diabetes

## 2013-02-03 NOTE — Assessment & Plan Note (Signed)
2012, internal capsule right posterior limb; no major residual deficits. Mild intracranial atherosclerosis by MRA.

## 2013-02-03 NOTE — Assessment & Plan Note (Signed)
He has severe mixed hyperlipidemia. Now on statin and Zetia therapy. It is time to repeat a lipid profile. We'll also check a metabolic panel including renal function and liver function tests because of the recent changes in his medication.

## 2013-02-03 NOTE — Assessment & Plan Note (Signed)
He appears to have recovered fully from his surgery. He is free of angina and dyspnea. He has been able to perform moderately intense physical activity without complaints. He can return to work without physical restrictions or limitations. As for her CAD is concerned the focus is known risk factor modification. We had a detailed discussion regarding the importance of changes in his diet and the importance of regular physical exercise. He is roughly 30 pounds above target weight. Ideally he will come down to a weight of around 172 and 75 pounds and reduce waist to 34 inches or less.

## 2013-02-03 NOTE — Progress Notes (Signed)
Patient ID: [name], male   DOB: Aug 23, 1955, 57 y.o.   MRN: [id number]     Reason for office visit Followup CAD status post CABG   Mr. Velador is now roughly 5 months status post bypass surgery, after presenting with an acute ST segment elevation myocardial infarction in February this year. He is feeling better after reduction in the dose of his beta blocker. He is able to mow the lawn and use the weedeater without complaints of dyspnea, chest pain or weakness.  The biggest problem that remains is long-term management of his risk factors: He remains obese and has borderline diabetes mellitus. He is compliant with his medications. He does not exercise regularly. He no longer smokes.  No Known Allergies  Current Outpatient Prescriptions  Medication Sig Dispense Refill  . aspirin EC 325 MG EC tablet Take 1 tablet (325 mg total) by mouth daily.  30 tablet    . atorvastatin (LIPITOR) 40 MG tablet Take 1 tablet (40 mg total) by mouth daily at 6 PM.  30 tablet  1  . cyclobenzaprine (FLEXERIL) 10 MG tablet Take 10 mg by mouth 3 (three) times daily as needed for muscle spasms.      Marland Kitchen escitalopram (LEXAPRO) 10 MG tablet Take 10 mg by mouth daily.      Marland Kitchen ezetimibe (ZETIA) 10 MG tablet Take 10 mg by mouth daily.      Marland Kitchen lisinopril (PRINIVIL,ZESTRIL) 5 MG tablet Take 1 tablet (5 mg total) by mouth daily.  30 tablet  6  . metoprolol tartrate (LOPRESSOR) 25 MG tablet Take 12.5 mg by mouth 2 (two) times daily.      . naproxen sodium (ANAPROX) 550 MG tablet Take 550 mg by mouth 2 (two) times daily between meals as needed.       No current facility-administered medications for this visit.    Past Medical History  Diagnosis Date  . Hypertension   . Borderline diabetic   . Mini stroke     2  . Hiatal hernia   . STEMI (ST elevation myocardial infarction)   . S/P CABG x 3   . Dyslipidemia 01/11/2013    Past Surgical History  Procedure Laterality Date  . Coronary artery bypass graft N/A  09/01/2012    Procedure: CORONARY ARTERY BYPASS GRAFTING (CABG);  Surgeon: Gaye Pollack, MD;  Location: [facility];  Service: Open Heart Surgery;  Laterality: N/A;  Coronary Artery Bypass Grafting Times Three Using Left Internal Mammary Artery and Left Saphenous leg Vein Harvested Endoscopically  . Intraoperative transesophageal echocardiogram N/A 09/01/2012    Procedure: INTRAOPERATIVE TRANSESOPHAGEAL ECHOCARDIOGRAM;  Surgeon: Gaye Pollack, MD;  Location: [facility];  Service: Open Heart Surgery;  Laterality: N/A;  . Cardiac catheterization      Family History  Problem Relation Age of Onset  . Cancer Father   . Hypertension Father   . Hyperlipidemia Father   . Hypertension Sister   . Hypertension Brother   . Hyperlipidemia Brother   . Heart attack Sister   . Heart attack Brother   . Heart attack Father     History   Social History  . Marital Status: Married    Spouse Name: N/A    Number of Children: N/A  . Years of Education: N/A   Occupational History  . Not on file.   Social History Main Topics  . Smoking status: Former Smoker    Types: Pipe  . Smokeless tobacco: Former Systems developer    Quit date: 07/15/1968  .  Alcohol Use: No  . Drug Use: No  . Sexually Active: Not on file   Other Topics Concern  . Not on file   Social History Narrative  . No narrative on file    Review of systems: The patient specifically denies any chest pain at rest or with exertion, dyspnea at rest or with exertion, orthopnea, paroxysmal nocturnal dyspnea, syncope, palpitations, focal neurological deficits, intermittent claudication, lower extremity edema, unexplained weight gain, cough, hemoptysis or wheezing.  The patient also denies abdominal pain, nausea, vomiting, dysphagia, diarrhea, constipation, polyuria, polydipsia, dysuria, hematuria, frequency, urgency, abnormal bleeding or bruising, fever, chills, unexpected weight changes, mood swings, change in skin or hair texture, change in voice quality,  auditory or visual problems, allergic reactions or rashes, new musculoskeletal complaints other than usual "aches and pains".   PHYSICAL EXAM BP 138/84  Pulse 64  Resp 20  Ht 5' 8.5" (1.74 m)  Wt 204 lb 11.2 oz (92.851 kg)  BMI 30.67 kg/m2  General: Alert, oriented x3, no distress Head: no evidence of trauma, PERRL, EOMI, no exophtalmos or lid lag, no myxedema, no xanthelasma; normal ears, nose and oropharynx Neck: normal jugular venous pulsations and no hepatojugular reflux; brisk carotid pulses without delay and no carotid bruits Chest: clear to auscultation, no signs of consolidation by percussion or palpation, normal fremitus, symmetrical and full respiratory excursions Cardiovascular: normal position and quality of the apical impulse, regular rhythm, normal first and second heart sounds, no murmurs, rubs or gallops Abdomen: no tenderness or distention, no masses by palpation, no abnormal pulsatility or arterial bruits, normal bowel sounds, no hepatosplenomegaly Extremities: no clubbing, cyanosis or edema; 2+ radial, ulnar and brachial pulses bilaterally; 2+ right femoral, posterior tibial and dorsalis pedis pulses; 2+ left femoral, posterior tibial and dorsalis pedis pulses; no subclavian or femoral bruits Neurological: grossly nonfocal   EKG: Sinus rhythm, nonspecific T wave abnormalities  Lipid Panel     Component Value Date/Time   CHOL 209* 08/31/2012 2338   TRIG 214* 08/31/2012 2338   HDL 30* 08/31/2012 2338   CHOLHDL 7.0 08/31/2012 2338   VLDL 43* 08/31/2012 2338   LDLCALC 136* 08/31/2012 2338    BMET    Component Value Date/Time   NA 135 09/11/2012 0411   K 3.9 09/11/2012 0411   CL 100 09/11/2012 0411   CO2 24 09/11/2012 0411   GLUCOSE 118* 09/11/2012 0411   BUN 21 09/11/2012 0411   CREATININE 1.27 09/11/2012 0411   CALCIUM 9.1 09/11/2012 0411   GFRNONAA 61* 09/11/2012 0411   GFRAA 70* 09/11/2012 0411     ASSESSMENT AND PLAN CAD S/P CABG X 3 (LIMA to LAD, SVG to OM, SVG  to RCA) -09/01/12 He appears to have recovered fully from his surgery. He is free of angina and dyspnea. He has been able to perform moderately intense physical activity without complaints. He can return to work without physical restrictions or limitations. As for her CAD is concerned the focus is known risk factor modification. We had a detailed discussion regarding the importance of changes in his diet and the importance of regular physical exercise. He is roughly 30 pounds above target weight. Ideally he will come down to a weight of around 172 and 75 pounds and reduce waist to 34 inches or less.  Borderline diabetic Weight loss would be highly beneficial to prevent the development of full-blown diabetes  Dyslipidemia He has severe mixed hyperlipidemia. Now on statin and Zetia therapy. It is time to repeat a lipid profile.  We'll also check a metabolic panel including renal function and liver function tests because of the recent changes in his medication.  Hypertension Good control, feels better after reduction in the dose of beta blocker.  Status post CVA 2012, internal capsule right posterior limb; no major residual deficits. Mild intracranial atherosclerosis by MRA.  Orders Placed This Encounter  Procedures  . Lipid Profile  . Comp Met (CMET)   Meds ordered this encounter  Medications  . cyclobenzaprine (FLEXERIL) 10 MG tablet    Sig: Take 10 mg by mouth 3 (three) times daily as needed for muscle spasms.  . naproxen sodium (ANAPROX) 550 MG tablet    Sig: Take 550 mg by mouth 2 (two) times daily between meals as needed.  . metoprolol tartrate (LOPRESSOR) 25 MG tablet    Sig: Take 12.5 mg by mouth 2 (two) times daily.    Holli Humbles, MD, [facility] [phone] office [phone] pager

## 2013-02-03 NOTE — Assessment & Plan Note (Signed)
Good control, feels better after reduction in the dose of beta blocker.

## 2013-02-04 ENCOUNTER — Telehealth: Payer: Self-pay | Admitting: Cardiovascular Disease

## 2013-02-04 NOTE — Telephone Encounter (Signed)
Message forwarded to Dr. Darlen Round, CMA

## 2013-02-04 NOTE — Telephone Encounter (Signed)
Mrs. Deamer states that [name] was here yesterday and saw Dr. Vivia Ewing.  Dr. Jonette Pesa out some papers for Mr. Cranshaw, but failed to sign a couple of places.  If we still have a copy, Dr. Loletha Grayer can sign those and fax to the company.  Please let Mrs. Sayler know the status.

## 2013-02-08 ENCOUNTER — Encounter: Payer: Self-pay | Admitting: *Deleted

## 2013-02-08 ENCOUNTER — Telehealth: Payer: Self-pay | Admitting: Cardiovascular Disease

## 2013-02-08 NOTE — Telephone Encounter (Signed)
Jahaven Bilson called and states that Mr. Jersey needs a note to return to work 02/15/2013 with no restrictions.  We thave the fax number for his employer.

## 2013-02-08 NOTE — Telephone Encounter (Signed)
I thought we had already done this. By all means we can send another note.

## 2013-02-08 NOTE — Telephone Encounter (Signed)
Will defer to Dr. Loletha Grayer.

## 2013-02-12 DIAGNOSIS — I2510 Atherosclerotic heart disease of native coronary artery without angina pectoris: Secondary | ICD-10-CM | POA: Diagnosis present

## 2013-02-17 ENCOUNTER — Other Ambulatory Visit: Payer: Self-pay | Admitting: Occupational Medicine

## 2013-02-17 ENCOUNTER — Ambulatory Visit: Payer: Self-pay

## 2013-02-17 DIAGNOSIS — R52 Pain, unspecified: Secondary | ICD-10-CM

## 2013-03-01 ENCOUNTER — Other Ambulatory Visit: Payer: Self-pay | Admitting: Occupational Medicine

## 2013-03-01 ENCOUNTER — Ambulatory Visit: Payer: Self-pay

## 2013-03-01 DIAGNOSIS — R52 Pain, unspecified: Secondary | ICD-10-CM

## 2013-03-04 NOTE — Telephone Encounter (Signed)
Note sent to employer for patient to return to work 02/15/13

## 2013-08-19 ENCOUNTER — Other Ambulatory Visit: Payer: Self-pay | Admitting: Cardiology

## 2013-08-19 NOTE — Telephone Encounter (Signed)
Rx was sent to pharmacy electronically. 

## 2013-10-05 ENCOUNTER — Other Ambulatory Visit: Payer: Self-pay | Admitting: Cardiovascular Disease

## 2013-10-05 NOTE — Telephone Encounter (Signed)
Rx was sent to pharmacy electronically. 

## 2014-03-08 ENCOUNTER — Other Ambulatory Visit: Payer: Self-pay

## 2014-03-08 MED ORDER — LISINOPRIL 5 MG PO TABS: 5.0000 mg | ORAL_TABLET | Freq: Every day | ORAL | Status: DC

## 2014-03-08 NOTE — Telephone Encounter (Signed)
Rx was sent to pharmacy electronically.Patient needs to call our office to schedule an appointment for future refills. Last office visit 02/03/2013 with Dr Sallyanne Kuster.

## 2014-06-23 ENCOUNTER — Encounter (HOSPITAL_COMMUNITY): Payer: Self-pay | Admitting: Cardiology

## 2019-03-23 ENCOUNTER — Inpatient Hospital Stay (HOSPITAL_COMMUNITY)
Admission: EM | Admit: 2019-03-23 | Discharge: 2019-03-26 | DRG: 246 | Disposition: A | Discharge status: Home / Self Care | Payer: BC Managed Care – PPO | Attending: Cardiovascular Disease | Admitting: Cardiovascular Disease

## 2019-03-23 ENCOUNTER — Encounter (HOSPITAL_COMMUNITY)
Admission: EM | Disposition: A | Discharge status: Home / Self Care | Payer: Self-pay | Source: Home / Self Care | Attending: Cardiovascular Disease

## 2019-03-23 ENCOUNTER — Encounter (HOSPITAL_COMMUNITY): Payer: Self-pay | Admitting: Internal Medicine

## 2019-03-23 ENCOUNTER — Emergency Department (HOSPITAL_COMMUNITY): Payer: BC Managed Care – PPO

## 2019-03-23 DIAGNOSIS — I10 Essential (primary) hypertension: Secondary | ICD-10-CM | POA: Diagnosis present

## 2019-03-23 DIAGNOSIS — Z87891 Personal history of nicotine dependence: Secondary | ICD-10-CM

## 2019-03-23 DIAGNOSIS — I25810 Atherosclerosis of coronary artery bypass graft(s) without angina pectoris: Secondary | ICD-10-CM

## 2019-03-23 DIAGNOSIS — Z951 Presence of aortocoronary bypass graft: Secondary | ICD-10-CM | POA: Diagnosis present

## 2019-03-23 DIAGNOSIS — I340 Nonrheumatic mitral (valve) insufficiency: Secondary | ICD-10-CM | POA: Diagnosis not present

## 2019-03-23 DIAGNOSIS — R402142 Coma scale, eyes open, spontaneous, at arrival to emergency department: Secondary | ICD-10-CM | POA: Diagnosis present

## 2019-03-23 DIAGNOSIS — E872 Acidosis: Secondary | ICD-10-CM | POA: Diagnosis not present

## 2019-03-23 DIAGNOSIS — J9601 Acute respiratory failure with hypoxia: Secondary | ICD-10-CM | POA: Diagnosis present

## 2019-03-23 DIAGNOSIS — Z9119 Patient's noncompliance with other medical treatment and regimen: Secondary | ICD-10-CM | POA: Diagnosis not present

## 2019-03-23 DIAGNOSIS — I2111 ST elevation (STEMI) myocardial infarction involving right coronary artery: Secondary | ICD-10-CM | POA: Diagnosis present

## 2019-03-23 DIAGNOSIS — R079 Chest pain, unspecified: Secondary | ICD-10-CM | POA: Diagnosis present

## 2019-03-23 DIAGNOSIS — G9349 Other encephalopathy: Secondary | ICD-10-CM | POA: Diagnosis present

## 2019-03-23 DIAGNOSIS — R4182 Altered mental status, unspecified: Secondary | ICD-10-CM

## 2019-03-23 DIAGNOSIS — I77810 Thoracic aortic ectasia: Secondary | ICD-10-CM | POA: Diagnosis present

## 2019-03-23 DIAGNOSIS — Z23 Encounter for immunization: Secondary | ICD-10-CM

## 2019-03-23 DIAGNOSIS — I472 Ventricular tachycardia: Secondary | ICD-10-CM | POA: Diagnosis not present

## 2019-03-23 DIAGNOSIS — I2510 Atherosclerotic heart disease of native coronary artery without angina pectoris: Secondary | ICD-10-CM | POA: Diagnosis present

## 2019-03-23 DIAGNOSIS — J9691 Respiratory failure, unspecified with hypoxia: Secondary | ICD-10-CM

## 2019-03-23 DIAGNOSIS — I213 ST elevation (STEMI) myocardial infarction of unspecified site: Secondary | ICD-10-CM

## 2019-03-23 DIAGNOSIS — R7303 Prediabetes: Secondary | ICD-10-CM | POA: Diagnosis present

## 2019-03-23 DIAGNOSIS — N179 Acute kidney failure, unspecified: Secondary | ICD-10-CM | POA: Diagnosis present

## 2019-03-23 DIAGNOSIS — I2119 ST elevation (STEMI) myocardial infarction involving other coronary artery of inferior wall: Secondary | ICD-10-CM | POA: Diagnosis not present

## 2019-03-23 DIAGNOSIS — I252 Old myocardial infarction: Secondary | ICD-10-CM

## 2019-03-23 DIAGNOSIS — I161 Hypertensive emergency: Secondary | ICD-10-CM | POA: Diagnosis present

## 2019-03-23 DIAGNOSIS — E876 Hypokalemia: Secondary | ICD-10-CM | POA: Diagnosis present

## 2019-03-23 DIAGNOSIS — I255 Ischemic cardiomyopathy: Secondary | ICD-10-CM | POA: Diagnosis present

## 2019-03-23 DIAGNOSIS — R402222 Coma scale, best verbal response, incomprehensible words, at arrival to emergency department: Secondary | ICD-10-CM | POA: Diagnosis present

## 2019-03-23 DIAGNOSIS — Z8673 Personal history of transient ischemic attack (TIA), and cerebral infarction without residual deficits: Secondary | ICD-10-CM

## 2019-03-23 DIAGNOSIS — Z8349 Family history of other endocrine, nutritional and metabolic diseases: Secondary | ICD-10-CM

## 2019-03-23 DIAGNOSIS — Z9114 Patient's other noncompliance with medication regimen: Secondary | ICD-10-CM | POA: Diagnosis not present

## 2019-03-23 DIAGNOSIS — R402352 Coma scale, best motor response, localizes pain, at arrival to emergency department: Secondary | ICD-10-CM | POA: Diagnosis present

## 2019-03-23 DIAGNOSIS — Z20828 Contact with and (suspected) exposure to other viral communicable diseases: Secondary | ICD-10-CM | POA: Diagnosis present

## 2019-03-23 DIAGNOSIS — E785 Hyperlipidemia, unspecified: Secondary | ICD-10-CM | POA: Diagnosis present

## 2019-03-23 DIAGNOSIS — Z955 Presence of coronary angioplasty implant and graft: Secondary | ICD-10-CM | POA: Diagnosis not present

## 2019-03-23 DIAGNOSIS — Z8249 Family history of ischemic heart disease and other diseases of the circulatory system: Secondary | ICD-10-CM

## 2019-03-23 HISTORY — DX: Atherosclerotic heart disease of native coronary artery without angina pectoris: I25.10

## 2019-03-23 HISTORY — PX: CORONARY/GRAFT ACUTE MI REVASCULARIZATION: CATH118305

## 2019-03-23 HISTORY — PX: LEFT HEART CATH AND CORONARY ANGIOGRAPHY: CATH118249

## 2019-03-23 HISTORY — DX: Cerebral infarction, unspecified: I63.9

## 2019-03-23 HISTORY — PX: CORONARY STENT INTERVENTION: CATH118234

## 2019-03-23 LAB — COMPREHENSIVE METABOLIC PANEL
ALT: 24 U/L (ref 0–44)
AST: 31 U/L (ref 15–41)
Albumin: 3.7 g/dL (ref 3.5–5.0)
Alkaline Phosphatase: 64 U/L (ref 38–126)
Anion gap: 15 (ref 5–15)
BUN: 20 mg/dL (ref 8–23)
CO2: 19 mmol/L — ABNORMAL LOW (ref 22–32)
Calcium: 8.7 mg/dL — ABNORMAL LOW (ref 8.9–10.3)
Chloride: 103 mmol/L (ref 98–111)
Creatinine, Ser: 1.7 mg/dL — ABNORMAL HIGH (ref 0.61–1.24)
GFR calc Af Amer: 48 mL/min — ABNORMAL LOW (ref >60)
GFR calc non Af Amer: 42 mL/min — ABNORMAL LOW (ref >60)
Glucose, Bld: 178 mg/dL — ABNORMAL HIGH (ref 70–99)
Potassium: 3.1 mmol/L — ABNORMAL LOW (ref 3.5–5.1)
Sodium: 137 mmol/L (ref 135–145)
Total Bilirubin: 0.5 mg/dL (ref 0.3–1.2)
Total Protein: 7.1 g/dL (ref 6.5–8.1)

## 2019-03-23 LAB — PROTIME-INR
INR: 1 (ref 0.8–1.2)
Prothrombin Time: 13.2 seconds (ref 11.4–15.2)

## 2019-03-23 LAB — LIPID PANEL
Cholesterol: 232 mg/dL — ABNORMAL HIGH (ref 0–200)
HDL: 29 mg/dL — ABNORMAL LOW (ref >40)
LDL Cholesterol: UNDETERMINED mg/dL (ref 0–99)
Total CHOL/HDL Ratio: 8 RATIO
Triglycerides: 675 mg/dL — ABNORMAL HIGH (ref <150)
VLDL: UNDETERMINED mg/dL (ref 0–40)

## 2019-03-23 LAB — CBC
HCT: 48.7 % (ref 39.0–52.0)
Hemoglobin: 16.7 g/dL (ref 13.0–17.0)
MCH: 31.6 pg (ref 26.0–34.0)
MCHC: 34.3 g/dL (ref 30.0–36.0)
MCV: 92.1 fL (ref 80.0–100.0)
Platelets: 423 10*3/uL — ABNORMAL HIGH (ref 150–400)
RBC: 5.29 MIL/uL (ref 4.22–5.81)
RDW: 12.5 % (ref 11.5–15.5)
WBC: 16.9 10*3/uL — ABNORMAL HIGH (ref 4.0–10.5)
nRBC: 0 % (ref 0.0–0.2)

## 2019-03-23 LAB — I-STAT CREATININE, ED: Creatinine, Ser: 1.6 mg/dL — ABNORMAL HIGH (ref 0.61–1.24)

## 2019-03-23 LAB — I-STAT CHEM 8, ED
BUN: 22 mg/dL (ref 8–23)
Calcium, Ion: 1.08 mmol/L — ABNORMAL LOW (ref 1.15–1.40)
Chloride: 106 mmol/L (ref 98–111)
Creatinine, Ser: 1.6 mg/dL — ABNORMAL HIGH (ref 0.61–1.24)
Glucose, Bld: 169 mg/dL — ABNORMAL HIGH (ref 70–99)
HCT: 50 % (ref 39.0–52.0)
Hemoglobin: 17 g/dL (ref 13.0–17.0)
Potassium: 3 mmol/L — ABNORMAL LOW (ref 3.5–5.1)
Sodium: 141 mmol/L (ref 135–145)
TCO2: 22 mmol/L (ref 22–32)

## 2019-03-23 LAB — GLUCOSE, CAPILLARY: Glucose-Capillary: 167 mg/dL — ABNORMAL HIGH (ref 70–99)

## 2019-03-23 LAB — TROPONIN I (HIGH SENSITIVITY): Troponin I (High Sensitivity): 21 ng/L — ABNORMAL HIGH (ref <18)

## 2019-03-23 LAB — TSH: TSH: 3.395 u[IU]/mL (ref 0.350–4.500)

## 2019-03-23 LAB — LDL CHOLESTEROL, DIRECT: Direct LDL: 94.8 mg/dL (ref 0–99)

## 2019-03-23 SURGERY — CORONARY/GRAFT ACUTE MI REVASCULARIZATION
Anesthesia: LOCAL

## 2019-03-23 MED ORDER — MIDAZOLAM HCL 2 MG/2ML IJ SOLN
INTRAMUSCULAR | Status: AC
  Filled 2019-03-23: qty 2

## 2019-03-23 MED ORDER — SODIUM CHLORIDE 0.9% FLUSH: 3.0000 mL | INTRAVENOUS | Status: DC | PRN

## 2019-03-23 MED ORDER — NITROGLYCERIN 1 MG/10 ML FOR IR/CATH LAB
INTRA_ARTERIAL | Status: AC
  Filled 2019-03-23: qty 10

## 2019-03-23 MED ORDER — FENTANYL BOLUS VIA INFUSION
50.0000 ug | INTRAVENOUS | Status: DC | PRN
  Filled 2019-03-23: qty 50

## 2019-03-23 MED ORDER — HEPARIN (PORCINE) IN NACL 1000-0.9 UT/500ML-% IV SOLN
INTRAVENOUS | Status: AC
  Filled 2019-03-23: qty 1000

## 2019-03-23 MED ORDER — BIVALIRUDIN TRIFLUOROACETATE 250 MG IV SOLR
INTRAVENOUS | Status: AC
  Filled 2019-03-23: qty 250

## 2019-03-23 MED ORDER — CLEVIDIPINE BUTYRATE 0.5 MG/ML IV EMUL
0.0000 mg/h | INTRAVENOUS | Status: DC
  Administered 2019-03-24: 1 mg/h via INTRAVENOUS
  Filled 2019-03-23: qty 50

## 2019-03-23 MED ORDER — ACETAMINOPHEN 325 MG PO TABS: 650.0000 mg | ORAL_TABLET | ORAL | Status: DC | PRN

## 2019-03-23 MED ORDER — IOHEXOL 350 MG/ML SOLN
INTRAVENOUS | Status: DC | PRN
  Administered 2019-03-23: 23:00:00 185 mL

## 2019-03-23 MED ORDER — ASPIRIN EC 81 MG PO TBEC: 81.0000 mg | DELAYED_RELEASE_TABLET | Freq: Every day | ORAL | Status: DC

## 2019-03-23 MED ORDER — ONDANSETRON HCL 4 MG/2ML IJ SOLN
INTRAMUSCULAR | Status: AC
  Administered 2019-03-23: 8 mg
  Filled 2019-03-23: qty 4

## 2019-03-23 MED ORDER — PROPOFOL 1000 MG/100ML IV EMUL
INTRAVENOUS | Status: AC
  Filled 2019-03-23: qty 100

## 2019-03-23 MED ORDER — FENTANYL CITRATE (PF) 100 MCG/2ML IJ SOLN
INTRAMUSCULAR | Status: AC
  Administered 2019-03-23: 20:00:00
  Filled 2019-03-23: qty 2

## 2019-03-23 MED ORDER — SODIUM CHLORIDE 0.9 % IV SOLN
INTRAVENOUS | Status: AC | PRN
  Administered 2019-03-23: 4 ug/kg/min via INTRAVENOUS

## 2019-03-23 MED ORDER — AMIODARONE HCL IN DEXTROSE 360-4.14 MG/200ML-% IV SOLN
INTRAVENOUS | Status: AC
  Filled 2019-03-23: qty 200

## 2019-03-23 MED ORDER — BIVALIRUDIN BOLUS VIA INFUSION - CUPID
INTRAVENOUS | Status: DC | PRN
  Administered 2019-03-23: 68.025 mg via INTRAVENOUS

## 2019-03-23 MED ORDER — CANGRELOR TETRASODIUM 50 MG IV SOLR
INTRAVENOUS | Status: AC
  Filled 2019-03-23: qty 50

## 2019-03-23 MED ORDER — ASPIRIN 81 MG PO CHEW
CHEWABLE_TABLET | ORAL | Status: AC
  Filled 2019-03-23: qty 3

## 2019-03-23 MED ORDER — FENTANYL BOLUS VIA INFUSION
50.0000 ug | Freq: Once | INTRAVENOUS | Status: AC
  Administered 2019-03-23: 50 ug via INTRAVENOUS
  Filled 2019-03-23: qty 50

## 2019-03-23 MED ORDER — ASPIRIN 81 MG PO CHEW
81.0000 mg | CHEWABLE_TABLET | Freq: Every day | ORAL | Status: DC
  Administered 2019-03-24 – 2019-03-26 (×2): 81 mg via ORAL
  Filled 2019-03-23 (×3): qty 1

## 2019-03-23 MED ORDER — SODIUM CHLORIDE 0.9 % IV SOLN
INTRAVENOUS | Status: AC
  Administered 2019-03-23: via INTRAVENOUS

## 2019-03-23 MED ORDER — SODIUM CHLORIDE 0.9 % IV SOLN
1.7500 mg/kg/h | INTRAVENOUS | Status: AC
  Administered 2019-03-24: 1.75 mg/kg/h via INTRAVENOUS
  Filled 2019-03-23 (×2): qty 250

## 2019-03-23 MED ORDER — NITROGLYCERIN 0.4 MG SL SUBL: 0.4000 mg | SUBLINGUAL_TABLET | SUBLINGUAL | Status: DC | PRN

## 2019-03-23 MED ORDER — ETOMIDATE 2 MG/ML IV SOLN
INTRAVENOUS | Status: AC | PRN
  Administered 2019-03-23: 25 mg via INTRAVENOUS

## 2019-03-23 MED ORDER — NITROGLYCERIN 1 MG/10 ML FOR IR/CATH LAB
INTRA_ARTERIAL | Status: DC | PRN
  Administered 2019-03-23: 200 ug via INTRACORONARY

## 2019-03-23 MED ORDER — MIDAZOLAM BOLUS VIA INFUSION
3.0000 mg | Freq: Once | INTRAVENOUS | Status: DC
  Filled 2019-03-23: qty 3

## 2019-03-23 MED ORDER — SODIUM CHLORIDE 0.9 % IV SOLN
INTRAVENOUS | Status: AC | PRN
  Administered 2019-03-23 (×2): 1.75 mg/kg/h via INTRAVENOUS

## 2019-03-23 MED ORDER — METOPROLOL TARTRATE 5 MG/5ML IV SOLN
INTRAVENOUS | Status: DC | PRN
  Administered 2019-03-23: 2.5 mg via INTRAVENOUS

## 2019-03-23 MED ORDER — MIDAZOLAM BOLUS VIA INFUSION
2.0000 mg | Freq: Once | INTRAVENOUS | Status: AC
  Administered 2019-03-23: 3 mg via INTRAVENOUS
  Filled 2019-03-23: qty 2

## 2019-03-23 MED ORDER — LIDOCAINE HCL (PF) 1 % IJ SOLN
INTRAMUSCULAR | Status: DC | PRN
  Administered 2019-03-23: 15 mL

## 2019-03-23 MED ORDER — ONDANSETRON HCL 4 MG/2ML IJ SOLN: 4.0000 mg | Freq: Four times a day (QID) | INTRAMUSCULAR | Status: DC | PRN

## 2019-03-23 MED ORDER — HYDRALAZINE HCL 20 MG/ML IJ SOLN
10.0000 mg | INTRAMUSCULAR | Status: AC | PRN
  Administered 2019-03-23: 10 mg via INTRAVENOUS
  Filled 2019-03-23: qty 1

## 2019-03-23 MED ORDER — ATORVASTATIN CALCIUM 80 MG PO TABS
80.0000 mg | ORAL_TABLET | Freq: Every day | ORAL | Status: DC
  Administered 2019-03-25: 17:00:00 80 mg via ORAL
  Filled 2019-03-23: qty 1

## 2019-03-23 MED ORDER — ROCURONIUM BROMIDE 50 MG/5ML IV SOLN
INTRAVENOUS | Status: AC | PRN
  Administered 2019-03-23: 80 mg via INTRAVENOUS

## 2019-03-23 MED ORDER — FENTANYL CITRATE (PF) 100 MCG/2ML IJ SOLN: 50.0000 ug | Freq: Once | INTRAMUSCULAR | Status: DC

## 2019-03-23 MED ORDER — METOPROLOL TARTRATE 5 MG/5ML IV SOLN
INTRAVENOUS | Status: AC
  Filled 2019-03-23: qty 5

## 2019-03-23 MED ORDER — FENTANYL 2500MCG IN NS 250ML (10MCG/ML) PREMIX INFUSION
50.0000 ug/h | INTRAVENOUS | Status: DC
  Administered 2019-03-23: 50 ug/h via INTRAVENOUS
  Filled 2019-03-23: qty 250

## 2019-03-23 MED ORDER — MIDAZOLAM 50MG/50ML (1MG/ML) PREMIX INFUSION
0.5000 mg/h | INTRAVENOUS | Status: DC
  Administered 2019-03-23: 2 mg/h via INTRAVENOUS
  Filled 2019-03-23: qty 50

## 2019-03-23 MED ORDER — MIDAZOLAM HCL 2 MG/2ML IJ SOLN
INTRAMUSCULAR | Status: DC | PRN
  Administered 2019-03-23: 3 mg via INTRAVENOUS

## 2019-03-23 MED ORDER — CANGRELOR BOLUS VIA INFUSION
INTRAVENOUS | Status: DC | PRN
  Administered 2019-03-23: 2721 ug via INTRAVENOUS

## 2019-03-23 MED ORDER — TICAGRELOR 90 MG PO TABS
90.0000 mg | ORAL_TABLET | Freq: Once | ORAL | Status: AC
  Administered 2019-03-23: 90 mg via ORAL

## 2019-03-23 MED ORDER — AMIODARONE LOAD VIA INFUSION
INTRAVENOUS | Status: DC | PRN
  Administered 2019-03-23: 150 mg via INTRAVENOUS

## 2019-03-23 MED ORDER — SODIUM CHLORIDE 0.9 % IV SOLN: 250.0000 mL | INTRAVENOUS | Status: DC | PRN

## 2019-03-23 MED ORDER — DROPERIDOL 2.5 MG/ML IJ SOLN
5.0000 mg | Freq: Once | INTRAMUSCULAR | Status: DC
  Filled 2019-03-23: qty 2

## 2019-03-23 MED ORDER — SODIUM CHLORIDE 0.9% FLUSH
3.0000 mL | Freq: Two times a day (BID) | INTRAVENOUS | Status: DC
  Administered 2019-03-24 – 2019-03-26 (×5): 3 mL via INTRAVENOUS

## 2019-03-23 MED ORDER — AMIODARONE HCL IN DEXTROSE 360-4.14 MG/200ML-% IV SOLN
30.0000 mg/h | INTRAVENOUS | Status: DC
  Administered 2019-03-24 (×3): 30 mg/h via INTRAVENOUS
  Filled 2019-03-23 (×2): qty 200

## 2019-03-23 MED ORDER — HEPARIN (PORCINE) IN NACL 1000-0.9 UT/500ML-% IV SOLN
INTRAVENOUS | Status: DC | PRN
  Administered 2019-03-23 (×2): 500 mL

## 2019-03-23 MED ORDER — TICAGRELOR 90 MG PO TABS
90.0000 mg | ORAL_TABLET | Freq: Two times a day (BID) | ORAL | Status: DC
  Administered 2019-03-23 – 2019-03-26 (×6): 90 mg via ORAL
  Filled 2019-03-23 (×7): qty 1

## 2019-03-23 MED ORDER — ONDANSETRON HCL 4 MG/2ML IJ SOLN
4.0000 mg | Freq: Four times a day (QID) | INTRAMUSCULAR | Status: DC | PRN
  Administered 2019-03-24 (×2): 4 mg via INTRAVENOUS
  Filled 2019-03-23 (×2): qty 2

## 2019-03-23 MED ORDER — AMIODARONE HCL IN DEXTROSE 360-4.14 MG/200ML-% IV SOLN
60.0000 mg/h | INTRAVENOUS | Status: AC
  Administered 2019-03-23 – 2019-03-24 (×2): 60 mg/h via INTRAVENOUS
  Filled 2019-03-23: qty 200

## 2019-03-23 MED ORDER — POTASSIUM CHLORIDE 20 MEQ PO PACK
40.0000 meq | PACK | ORAL | Status: AC
  Administered 2019-03-23 – 2019-03-24 (×2): 40 meq via ORAL
  Filled 2019-03-23 (×3): qty 2

## 2019-03-23 MED ORDER — LABETALOL HCL 5 MG/ML IV SOLN: 10.0000 mg | INTRAVENOUS | Status: AC | PRN

## 2019-03-23 MED ORDER — LIDOCAINE HCL (PF) 1 % IJ SOLN
INTRAMUSCULAR | Status: AC
  Filled 2019-03-23: qty 30

## 2019-03-23 SURGICAL SUPPLY — 16 items
BALLN SAPPHIRE 2.0X12 (BALLOONS) ×2
BALLOON SAPPHIRE 2.0X12 (BALLOONS) IMPLANT
CATH INFINITI 5FR MULTPACK ANG (CATHETERS) ×1 IMPLANT
CATH VISTA GUIDE RCB (CATHETERS) ×1 IMPLANT
CATHETER LAUNCHER 6FR LCB (CATHETERS) ×1 IMPLANT
ELECT DEFIB PAD ADLT CADENCE (PAD) ×1 IMPLANT
HOVERMATT SINGLE USE (MISCELLANEOUS) ×1 IMPLANT
KIT ENCORE 26 ADVANTAGE (KITS) ×1 IMPLANT
KIT HEART LEFT (KITS) ×2 IMPLANT
PACK CARDIAC CATHETERIZATION (CUSTOM PROCEDURE TRAY) ×2 IMPLANT
SHEATH PINNACLE 6F 10CM (SHEATH) ×1 IMPLANT
STENT SYNERGY DES 2.5X20 (Permanent Stent) ×1 IMPLANT
TRANSDUCER W/STOPCOCK (MISCELLANEOUS) ×2 IMPLANT
TUBING CIL FLEX 10 FLL-RA (TUBING) ×2 IMPLANT
WIRE ASAHI PROWATER 180CM (WIRE) ×1 IMPLANT
WIRE EMERALD 3MM-J .035X150CM (WIRE) ×1 IMPLANT

## 2019-03-23 NOTE — ED Provider Notes (Signed)
[facility] EMERGENCY DEPARTMENT Provider Note   CSN: [id number] Arrival date & time: 03/23/19  2017     History   Chief Complaint Chief Complaint  Patient presents with   Code STEMI    HPI [name] is a 63 y.o. male presented to the emergency department as a code STEMI.  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  He was nonverbal and EMS arrived and was writhing on the ground clutching his chest.  Single baby aspirin was administered but the patient refused to take any further aspirin.  He was not following commands per EMS.  On arrival in the ED the patient is nonverbal with [place] moaning and writhing on the stretcher.  He is uncooperative with exam.  He cannot answer any questions.  He demonstrates sternotomy scars on exam.     HPI  Past Medical History:  Diagnosis Date   Borderline diabetic    Coronary artery disease    Dyslipidemia 01/11/2013   Hiatal hernia    Hypertension    Mini stroke ([place])    2   S/P CABG x 3    STEMI (ST elevation myocardial infarction) ([place])    Stroke [facility])     Patient Active Problem List   Diagnosis Date Noted   Altered mental status 03/23/2019   Acute ST elevation myocardial infarction (STEMI) involving right coronary artery ([place]) 03/23/2019   Status post CVA 02/03/2013   Weakness 01/11/2013   Dyslipidemia 01/11/2013   CAD S/P CABG X 3 (LIMA to LAD, SVG to OM, SVG to RCA) -09/01/12 09/02/2012   STEMI (ST elevation myocardial infarction) ([place]) 09/01/2012   Hypertension    Borderline diabetic    Hiatal hernia     Past Surgical History:  Procedure Laterality Date   CARDIAC CATHETERIZATION     CORONARY ARTERY BYPASS GRAFT N/A 09/01/2012   Procedure: CORONARY ARTERY BYPASS GRAFTING (CABG);  Surgeon: Gaye Pollack, MD;  Location: [place];  Service: Open Heart Surgery;  Laterality: N/A;  Coronary Artery Bypass Grafting Times Three Using Left Internal Mammary Artery and  Left Saphenous leg Vein Harvested Endoscopically   INTRAOPERATIVE TRANSESOPHAGEAL ECHOCARDIOGRAM N/A 09/01/2012   Procedure: INTRAOPERATIVE TRANSESOPHAGEAL ECHOCARDIOGRAM;  Surgeon: Gaye Pollack, MD;  Location: [place] OR;  Service: Open Heart Surgery;  Laterality: N/A;   LEFT HEART CATHETERIZATION WITH CORONARY ANGIOGRAM N/A 08/31/2012   Procedure: LEFT HEART CATHETERIZATION WITH CORONARY ANGIOGRAM;  Surgeon: Peter M Martinique, MD;  Location: [facility] CATH LAB;  Service: Cardiovascular;  Laterality: N/A;   PERCUTANEOUS CORONARY STENT INTERVENTION (PCI-S) N/A 08/31/2012   Procedure: PERCUTANEOUS CORONARY STENT INTERVENTION (PCI-S);  Surgeon: Peter M Martinique, MD;  Location: [facility] CATH LAB;  Service: Cardiovascular;  Laterality: N/A;        Home Medications    Prior to Admission medications   Not on File    Family History Family History  Problem Relation Age of Onset   Cancer Father    Hypertension Father    Hyperlipidemia Father    Heart attack Father    Hypertension Sister    Hypertension Brother    Hyperlipidemia Brother    Heart attack Sister    Heart attack Brother     Social History Social History   Tobacco Use   Smoking status: Former Smoker    Types: Pipe   Smokeless tobacco: Former Systems developer    Quit date: 07/15/1968  Substance Use Topics   Alcohol use: No   Drug  use: No     Allergies   Patient has no known allergies.   Review of Systems Review of Systems  Unable to perform ROS: Acuity of condition     Physical Exam Updated Vital Signs BP (!) 124/98    Pulse 93    Resp 11    Ht 5\' 8"  (1.727 m)    Wt 90.7 kg    SpO2 99%    BMI 30.41 kg/m   Physical Exam Vitals signs and nursing note reviewed.  Constitutional:      General: He is in acute distress.     Appearance: He is well-developed. He is ill-appearing and diaphoretic.     Comments: Moaning, clutching his chest  HENT:     Head: Normocephalic and atraumatic.  Eyes:     Pupils: Pupils are equal, round,  and reactive to light.  Neck:     Musculoskeletal: Normal range of motion and neck supple.  Cardiovascular:     Rate and Rhythm: Tachycardia present.     Pulses: Normal pulses.  Pulmonary:     Effort: Pulmonary effort is normal. No respiratory distress.  Abdominal:     Palpations: Abdomen is soft.  Musculoskeletal:        General: No swelling or deformity.  Skin:    Comments: Sternotomy scar on mid chest  Neurological:     Mental Status: He is alert.     GCS: GCS eye subscore is 4. GCS verbal subscore is 2. GCS motor subscore is 5.     Comments: Grunting, uncooperative with exam GCS 11      ED Treatments / Results  Labs (all labs ordered are listed, but only abnormal results are displayed) Labs Reviewed  CBC - Abnormal; Notable for the following components:      Result Value   WBC 16.9 (*)    Platelets 423 (*)    All other components within normal limits  COMPREHENSIVE METABOLIC PANEL - Abnormal; Notable for the following components:   Potassium 3.1 (*)    CO2 19 (*)    Glucose, Bld 178 (*)    Creatinine, Ser 1.70 (*)    Calcium 8.7 (*)    GFR calc non Af Amer 42 (*)    GFR calc Af Amer 48 (*)    All other components within normal limits  LIPID PANEL - Abnormal; Notable for the following components:   Cholesterol 232 (*)    Triglycerides 675 (*)    HDL 29 (*)    All other components within normal limits  CBC - Abnormal; Notable for the following components:   WBC 21.7 (*)    Platelets 414 (*)    All other components within normal limits  GLUCOSE, CAPILLARY - Abnormal; Notable for the following components:   Glucose-Capillary 167 (*)    All other components within normal limits  I-STAT CREATININE, ED - Abnormal; Notable for the following components:   Creatinine, Ser 1.60 (*)    All other components within normal limits  I-STAT CHEM 8, ED - Abnormal; Notable for the following components:   Potassium 3.0 (*)    Creatinine, Ser 1.60 (*)    Glucose, Bld 169 (*)     Calcium, Ion 1.08 (*)    All other components within normal limits  POCT I-STAT 7, (LYTES, BLD GAS, ICA,H+H) - Abnormal; Notable for the following components:   pH, Arterial 7.278 (*)    pCO2 arterial 50.1 (*)    pO2, Arterial 342.0 (*)  Acid-base deficit 4.0 (*)    All other components within normal limits  TROPONIN I (HIGH SENSITIVITY) - Abnormal; Notable for the following components:   Troponin I (High Sensitivity) 21 (*)    All other components within normal limits  SARS CORONAVIRUS 2 (TAT 6-24 HRS)  TSH  PROTIME-INR  LDL CHOLESTEROL, DIRECT  HEMOGLOBIN A1C  HIV ANTIBODY (ROUTINE TESTING W REFLEX)  BASIC METABOLIC PANEL  TROPONIN I (HIGH SENSITIVITY)    EKG None  Radiology Dg Chest Portable 1 View  Result Date: 03/23/2019 CLINICAL DATA:  Post intubation EXAM: PORTABLE CHEST 1 VIEW COMPARISON:  03/23/2019, 09/28/2012 FINDINGS: Post sternotomy changes. Placement of endotracheal tube, the tip is about 3.4 cm superior to the carina. Esophageal tube tip is below the diaphragm but non included. Enlarged cardiomediastinal silhouette. Low lung volume. No acute airspace disease. IMPRESSION: 1. Endotracheal tube tip about 3.4 cm superior to carina. Esophageal tube tip below the diaphragm but non included 2. Low lung volumes.  Mild cardiomegaly. Electronically Signed   By: Donavan Foil M.D.   On: 03/23/2019 20:56   Dg Chest Portable 1 View  Result Date: 03/23/2019 CLINICAL DATA:  Chest pain for 1 hour. EXAM: PORTABLE CHEST 1 VIEW COMPARISON:  09/28/2012 FINDINGS: Previous median sternotomy and CABG procedure. Heart size appears enlarged. No pleural effusion or edema. No airspace opacities. IMPRESSION: 1. No active disease. 2. Cardiac enlargement. Electronically Signed   By: Kerby Moors M.D.   On: 03/23/2019 20:42    Procedures .Critical Care Performed by: Wyvonnia Dusky, MD Authorized by: Wyvonnia Dusky, MD   Critical care provider statement:    Critical care time  (minutes):  45   Critical care was necessary to treat or prevent imminent or life-threatening deterioration of the following conditions:  Cardiac failure   Critical care was time spent personally by me on the following activities:  Discussions with consultants, evaluation of patient's response to treatment, examination of patient, ordering and performing treatments and interventions, ordering and review of laboratory studies, ordering and review of radiographic studies, pulse oximetry, re-evaluation of patient's condition, obtaining history from patient or surrogate, review of old charts and ventilator management Comments:     Combatative patient with STEMI requiring sedation, intubation, cardiac monitoring, EKG interpretation, IV fluids, and ventilator management Date/Time: 03/23/2019 8:50 PM Performed by: Wyvonnia Dusky, MD Pre-anesthesia Checklist: Emergency Drugs available, Patient identified, Timeout performed, Patient being monitored and Suction available Oxygen Delivery Method: Non-rebreather mask Induction Type: Rapid sequence Laryngoscope Size: Glidescope and 3 Grade View: Grade II Tube type: Subglottic suction tube Tube size: 7.5 mm Number of attempts: 1 Placement Confirmation: ETT inserted through vocal cords under direct vision,  Positive ETCO2,  CO2 detector and Breath sounds checked- equal and bilateral Secured at: 23 cm Tube secured with: ETT holder Dental Injury: Teeth and Oropharynx as per pre-operative assessment       (including critical care time)  Medications Ordered in ED Medications  aspirin 81 MG chewable tablet (has no administration in time range)  propofol (DIPRIVAN) 1000 MG/100ML infusion (  Rate/Dose Change 03/23/19 2057)  fentaNYL (SUBLIMAZE) injection 50 mcg ( Intravenous MAR Unhold 03/23/19 2308)  fentaNYL 2577mcg in NS 226mL (68mcg/ml) infusion-PREMIX (150 mcg/hr Intravenous Rate/Dose Verify 03/24/19 0000)  fentaNYL (SUBLIMAZE) bolus via infusion 50 mcg (  Intravenous MAR Unhold 03/23/19 2308)  midazolam (VERSED) 50 mg/50 mL (1 mg/mL) premix infusion (2 mg/hr Intravenous New Bag/Given 03/23/19 2149)  nitroGLYCERIN (NITROSTAT) SL tablet 0.4 mg (has no administration in  time range)  atorvastatin (LIPITOR) tablet 80 mg (has no administration in time range)  clevidipine (CLEVIPREX) infusion 0.5 mg/mL (1 mg/hr Intravenous New Bag/Given 03/24/19 0058)  labetalol (NORMODYNE) injection 10 mg (has no administration in time range)  hydrALAZINE (APRESOLINE) injection 10 mg (10 mg Intravenous Given 03/23/19 2319)  acetaminophen (TYLENOL) tablet 650 mg (has no administration in time range)  ondansetron (ZOFRAN) injection 4 mg (has no administration in time range)  0.9 %  sodium chloride infusion ( Intravenous Rate/Dose Verify 03/24/19 0000)  sodium chloride flush (NS) 0.9 % injection 3 mL (3 mLs Intravenous Not Given 03/24/19 0110)  sodium chloride flush (NS) 0.9 % injection 3 mL (has no administration in time range)  0.9 %  sodium chloride infusion (has no administration in time range)  aspirin chewable tablet 81 mg (has no administration in time range)  ticagrelor (BRILINTA) tablet 90 mg (90 mg Oral Given 03/23/19 2318)  bivalirudin (ANGIOMAX) 250 mg in sodium chloride 0.9 % 50 mL (5 mg/mL) infusion (1.75 mg/kg/hr  90.7 kg Intravenous New Bag/Given 03/24/19 0000)  amiodarone (NEXTERONE PREMIX) 360-4.14 MG/200ML-% (1.8 mg/mL) IV infusion (60 mg/hr Intravenous New Bag/Given 03/24/19 0112)    Followed by  amiodarone (NEXTERONE PREMIX) 360-4.14 MG/200ML-% (1.8 mg/mL) IV infusion (has no administration in time range)  midazolam (VERSED) bolus via infusion 3 mg (has no administration in time range)  Chlorhexidine Gluconate Cloth 2 % PADS 6 each (has no administration in time range)  fentaNYL (SUBLIMAZE) 100 MCG/2ML injection (  Given 03/23/19 2019)  ondansetron (ZOFRAN) 4 MG/2ML injection (8 mg  Given 03/23/19 2030)  etomidate (AMIDATE) injection (25 mg Intravenous Given 03/23/19  2038)  rocuronium (ZEMURON) injection (80 mg Intravenous Given 03/23/19 2039)  potassium chloride (KLOR-CON) packet 40 mEq (40 mEq Oral Given 03/24/19 0059)  bivalirudin (ANGIOMAX) 250 mg in sodium chloride 0.9 % 50 mL (5 mg/mL) infusion (1.75 mg/kg/hr  90.7 kg Intravenous New Bag/Given 03/23/19 2210)  cangrelor (KENGREAL) 50,000 mcg in sodium chloride 0.9 % 250 mL (200 mcg/mL) infusion (4 mcg/kg/min  90.7 kg Intravenous New Bag/Given 03/23/19 2143)  midazolam (VERSED) bolus via infusion 2 mg (3 mg Intravenous Bolus from Bag 03/23/19 2251)  fentaNYL (SUBLIMAZE) bolus via infusion 50 mcg (50 mcg Intravenous Bolus from Bag 03/23/19 2313)  fentaNYL (SUBLIMAZE) bolus via infusion 50 mcg (50 mcg Intravenous Bolus from Bag 03/23/19 2249)  ticagrelor (BRILINTA) tablet 90 mg (90 mg Oral Given 03/23/19 2335)  nitroGLYCERIN 100 mcg/mL intra-arterial injection (has no administration in time range)  lidocaine (PF) (XYLOCAINE) 1 % injection (has no administration in time range)  Heparin (Porcine) in NaCl 1000-0.9 UT/500ML-% SOLN (has no administration in time range)  bivalirudin (ANGIOMAX) 250 MG injection (has no administration in time range)  cangrelor (KENGREAL) 50 MG SOLR (has no administration in time range)  midazolam (VERSED) 2 MG/2ML injection (has no administration in time range)  midazolam (VERSED) 2 MG/2ML injection (has no administration in time range)  metoprolol tartrate (LOPRESSOR) 5 MG/5ML injection (has no administration in time range)  bivalirudin (ANGIOMAX) 250 MG injection (has no administration in time range)  amiodarone (NEXTERONE PREMIX) 360-4.14 MG/200ML-% (1.8 mg/mL) IV infusion (has no administration in time range)     Initial Impression / Assessment and Plan / ED Course  I have reviewed the triage vital signs and the nursing notes.  Pertinent labs & imaging results that were available during my care of the patient were reviewed by me and considered in my medical decision making (see chart  for  details).  63 year old gentleman presenting by EMS as a STEMI alert, after being found outside of a fire house complaining of chest pain.  The patient is nonverbal on arrival and diaphoretic and grunting with a GCS of 11, appears to be in pain and clutching his chest.  He was inconsolable on arrival.  His EKG shows ST elevations in the inferior leads concerning for MI.  Cardiology was notified of his arrival prior to his arrival in the fellow was present at bedside.  After brief discussion and outlined in real-time below in the ED course, the decision was made to intubate the patient and take him to the Cath Lab, in order to prevent any further delays in his door to balloon time.   Clinical Course as of Mar 24 119  Tue Mar 23, 2019  2052 After multiple attempts to reason with the patient and attempt to communicate with him, the decision was made to rapidly intubate the patient.  This was done after 100 mcg of fentanyl was administered for pain.  The patient remained nonverbal and uncooperative.  For his safety for the ability to perform emergent catheterization, we performed rapid sequence intubation using rocuronium and etomidate.  A glide scope was used.  ET tube is secured in place.  The patient tolerated the procedure well.  He is now pending catheterization with the cardiology attending Dr. Alvester Chou was present at bedside.   [MT]    Clinical Course User Index [MT] Langston Masker Carola Rhine, MD        Final Clinical Impressions(s) / ED Diagnoses   Final diagnoses:  ST elevation myocardial infarction (STEMI), unspecified artery [facility])    ED Discharge Orders         Ordered    AMB Referral to Cardiac Rehabilitation - Phase II     03/23/19 2213           Wyvonnia Dusky, MD 03/24/19 0121

## 2019-03-23 NOTE — ED Triage Notes (Signed)
Per [facility], Pt with complaints of CP x 1 hour. Pt was outside working at the time. Pt combative, unable to answer questions. Pt clutching chest and groaning.

## 2019-03-23 NOTE — Progress Notes (Signed)
RT received pt post cath lab on vent. Pt measured x3 with height of 68" and placed on his 8cc accordingly. ABG ordered for 1 hour from now. RT will continue to monitor.

## 2019-03-23 NOTE — H&P (Addendum)
Cardiology Consultation:   Patient ID: [name] MRN: [id number]; DOB: 1956-06-26  Admit date: 03/23/2019 Date of Consult: 03/23/2019  Primary Care Provider: [name], MD Primary Cardiologist: Dr. Abbie Sons Primary Electrophysiologist:  None    Patient Profile:   [name] is a 63 y.o. male with a hx of CAD (PCI to [place] 08/2012, CABG 09/2012: LIMA-LAD, SVG-RCA, SVG-OM), HTN, HLD, CVA and borderline diabetes who is being seen today for a STEMI.   History of Present Illness:   The history is obtained from his wife as the patient was combative in the ER and uncooperative.  Per his wife, he was in his usual state of health today. He was out working in his yard when he came in, was flushed, and stated that he felt unwell. He started to develop significant chest pain. His wife gave him a full dose of aspirin but his pain continued to worsen so they called EMS. When EMS arrived, he was becoming progressively more combative, would not follow commands or answer questions. He was brought to the ER where he was intubated and brought to the cath lab.   His wife states that he has no alcohol or drug use. He was previously a light smoker but quite some time ago. He had a STEMI in 2014 and took his medications for a few years after that. However, his PCP left the practice about 1.5 years ago and has not been replaced yet. He stopped taking all of his medications around that time (including baby aspirin, vitamins, etc.) and has not seen a doctor since that time. 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 He has been active at home and his wife denies any recent complaints of chest pain or shortness of breath that she knows of.   Heart Pathway Score:     Past Medical History:  Diagnosis Date   Borderline diabetic    Coronary artery disease    Dyslipidemia 01/11/2013   Hiatal hernia    Hypertension    Mini stroke ([place])    2   S/P CABG x 3    STEMI (ST  elevation myocardial infarction) ([place])    Stroke [facility])     Past Surgical History:  Procedure Laterality Date   CARDIAC CATHETERIZATION     CORONARY ARTERY BYPASS GRAFT N/A 09/01/2012   Procedure: CORONARY ARTERY BYPASS GRAFTING (CABG);  Surgeon: Gaye Pollack, MD;  Location: [place];  Service: Open Heart Surgery;  Laterality: N/A;  Coronary Artery Bypass Grafting Times Three Using Left Internal Mammary Artery and Left Saphenous leg Vein Harvested Endoscopically   INTRAOPERATIVE TRANSESOPHAGEAL ECHOCARDIOGRAM N/A 09/01/2012   Procedure: INTRAOPERATIVE TRANSESOPHAGEAL ECHOCARDIOGRAM;  Surgeon: Gaye Pollack, MD;  Location: [place] OR;  Service: Open Heart Surgery;  Laterality: N/A;   LEFT HEART CATHETERIZATION WITH CORONARY ANGIOGRAM N/A 08/31/2012   Procedure: LEFT HEART CATHETERIZATION WITH CORONARY ANGIOGRAM;  Surgeon: Peter M Martinique, MD;  Location: [facility] C[facility] LAB;  Service: Cardiovascular;  Laterality: N/A;   PERCUTANEOUS CORONARY STENT INTERVENTION (PCI-S) N/A 08/31/2012   Procedure: PERCUTANEOUS CORONARY STENT INTERVENTION (PCI-S);  Surgeon: Peter M Martinique, MD;  Location: [facility] CATH LAB;  Service: Cardiovascular;  Laterality: N/A;     Home Medications:  None  Inpatient Medications: Scheduled Meds:  aspirin       [MAR Hold] fentaNYL (SUBLIMAZE) injection  50 mcg Intravenous Once   potassium chloride  40 mEq Oral Q2H   Continuous Infusions:  fentaNYL infusion INTRAVENOUS  midazolam     propofol 16.2 mL/hr at 03/23/19 2057   PRN Meds: [MAR Hold] fentaNYL, Heparin (Porcine) in NaCl, lidocaine (PF)  Allergies:   No Known Allergies  Social History:   Social History   Socioeconomic History   Marital status: Married    Spouse name: Not on file   Number of children: Not on file   Years of education: Not on file   Highest education level: Not on file  Occupational History   Not on file  Social Needs   Financial resource strain: Not on file   Food insecurity     Worry: Not on file    Inability: Not on file   Transportation needs    Medical: Not on file    Non-medical: Not on file  Tobacco Use   Smoking status: Former Smoker    Types: Pipe   Smokeless tobacco: Former Systems developer    Quit date: 07/15/1968  Substance and Sexual Activity   Alcohol use: No   Drug use: No   Sexual activity: Not on file  Lifestyle   Physical activity    Days per week: Not on file    Minutes per session: Not on file   Stress: Not on file  Relationships   Social connections    Talks on phone: Not on file    Gets together: Not on file    Attends religious service: Not on file    Active member of club or organization: Not on file    Attends meetings of clubs or organizations: Not on file    Relationship status: Not on file   Intimate partner violence    Fear of current or ex partner: Not on file    Emotionally abused: Not on file    Physically abused: Not on file    Forced sexual activity: Not on file  Other Topics Concern   Not on file  Social History Narrative   Not on file    Family History:   Family History  Problem Relation Age of Onset   Cancer Father    Hypertension Father    Hyperlipidemia Father    Heart attack Father    Hypertension Sister    Hypertension Brother    Hyperlipidemia Brother    Heart attack Sister    Heart attack Brother    Sister and brother recently both passed away due to cancer. Adult children are healthy  ROS:  Unable to be obtained due to patient factors.  Physical Exam/Data:   Vitals:   03/23/19 2036 03/23/19 2040 03/23/19 2045 03/23/19 2047  BP:  (!) 154/106 (!) 175/122   Pulse: (!) 54 75 (!) 45   Resp: 16 (!) 6 15   SpO2: 100% 100% 100%   Weight:    90.7 kg  Height:    5\' 5"  (1.651 m)   No intake or output data in the 24 hours ending 03/23/19 2133 Last 3 Weights 03/23/2019 02/03/2013 01/11/2013  Weight (lbs) 200 lb 204 lb 11.2 oz 209 lb  Weight (kg) 90.719 kg 92.851 kg 94.802 kg     Body mass  index is 33.28 kg/m.  General:  Appears uncomfortable, grabbing chest, writhing around in bed  HEENT: Pinpoint pupils bilaterally Neck: JVP at the midneck when lying at 45 degrees  Vascular:  FA and radial pulses 2+ bilaterally  Cardiac:  normal S1, S2; RRR; no murmurs, rubs or gallops Lungs:  clear to auscultation bilaterally, no wheezing, rhonchi or rales  Abd: soft,  nontender, no hepatomegaly  Ext: no edema, mildly cool to the tough Skin: Clammy  Neuro:  CNs 2-12 grossly intact, moving all extremities  Psych:  Unable to follow commands   EKG:  The EKG was personally reviewed and demonstrates:  ST elevation in lateral leads with EMS ECG. No ECG completed on arrival due to combative behavior  Relevant CV Studies: Echo and cath in 2014- unable to see results  Laboratory Data:  High Sensitivity Troponin:   Recent Labs  Lab 03/23/19 2045  TROPONINIHS 21*     Chemistry Recent Labs  Lab 03/23/19 2039 03/23/19 2045  NA 141 137  K 3.0* 3.1*  CL 106 103  CO2  --  19*  GLUCOSE 169* 178*  BUN 22 20  CREATININE 1.60*   1.60* 1.70*  CALCIUM  --  8.7*  GFRNONAA  --  42*  GFRAA  --  48*  ANIONGAP  --  15    Recent Labs  Lab 03/23/19 2045  PROT 7.1  ALBUMIN 3.7  AST 31  ALT 24  ALKPHOS 64  BILITOT 0.5   Hematology Recent Labs  Lab 03/23/19 2039 03/23/19 2045  WBC  --  16.9*  RBC  --  5.29  HGB 17.0 16.7  HCT 50.0 48.7  MCV  --  92.1  MCH  --  31.6  MCHC  --  34.3  RDW  --  12.5  PLT  --  423*   BNPNo results for input(s): BNP, PROBNP in the last 168 hours.  DDimer No results for input(s): DDIMER in the last 168 hours.   Radiology/Studies:  Dg Chest Portable 1 View  Result Date: 03/23/2019 CLINICAL DATA:  Post intubation EXAM: PORTABLE CHEST 1 VIEW COMPARISON:  03/23/2019, 09/28/2012 FINDINGS: Post sternotomy changes. Placement of endotracheal tube, the tip is about 3.4 cm superior to the carina. Esophageal tube tip is below the diaphragm but non included.  Enlarged cardiomediastinal silhouette. Low lung volume. No acute airspace disease. IMPRESSION: 1. Endotracheal tube tip about 3.4 cm superior to carina. Esophageal tube tip below the diaphragm but non included 2. Low lung volumes.  Mild cardiomegaly. Electronically Signed   By: Donavan Foil M.D.   On: 03/23/2019 20:56   Dg Chest Portable 1 View  Result Date: 03/23/2019 CLINICAL DATA:  Chest pain for 1 hour. EXAM: PORTABLE CHEST 1 VIEW COMPARISON:  09/28/2012 FINDINGS: Previous median sternotomy and CABG procedure. Heart size appears enlarged. No pleural effusion or edema. No airspace opacities. IMPRESSION: 1. No active disease. 2. Cardiac enlargement. Electronically Signed   By: Kerby Moors M.D.   On: 03/23/2019 20:42    Assessment and Plan:   Dariusz Juntunen is a 63 y.o. male with a hx of CAD (PCI to [place] 08/2012, CABG 09/2012: LIMA-LAD, SVG-RCA, SVG-OM), HTN, HLD, CVA and borderline diabetes who is being seen today for a STEMI. His cath demonstrated a 100% thrombotic occlusion of his SVG-OM that was successfully opened with PCI. He had some ventricular arrhythmias during his case that have since improved with revascularization.  - S/p aspirin 325mg  at home - Continue aspirin 81mg  daily - Echo ordered for the morning - Keep intubated overnight while sheath in place  - Follow-up HgA1c and lipid panel - Start lipitor 80mg  nightly  - ECG and nitro PRN for chest pain - Clevidipine drip for hypertension  - S/p ticagrelor 180mg  x 1 - Continue ticagrelor 90mg  BID  - Continue amiodarone drip - Monitor on tele  - Continue bivalirudin x  4 hours post-cath      For questions or updates, please contact [place] Please consult [email or web address] for contact info under     Signed, Princella Pellegrini, MD  03/23/2019 9:33 PM

## 2019-03-23 NOTE — Progress Notes (Signed)
Patient transported on vent from ED-38 to Cath Lab, and then to 2H-07 post procedure.  No complications.

## 2019-03-24 ENCOUNTER — Encounter (HOSPITAL_COMMUNITY): Payer: Self-pay | Admitting: Cardiovascular Disease

## 2019-03-24 ENCOUNTER — Inpatient Hospital Stay (HOSPITAL_COMMUNITY): Payer: BC Managed Care – PPO

## 2019-03-24 DIAGNOSIS — J9601 Acute respiratory failure with hypoxia: Secondary | ICD-10-CM

## 2019-03-24 DIAGNOSIS — J9691 Respiratory failure, unspecified with hypoxia: Secondary | ICD-10-CM

## 2019-03-24 DIAGNOSIS — R4182 Altered mental status, unspecified: Secondary | ICD-10-CM

## 2019-03-24 DIAGNOSIS — I2119 ST elevation (STEMI) myocardial infarction involving other coronary artery of inferior wall: Secondary | ICD-10-CM

## 2019-03-24 DIAGNOSIS — I10 Essential (primary) hypertension: Secondary | ICD-10-CM

## 2019-03-24 DIAGNOSIS — I340 Nonrheumatic mitral (valve) insufficiency: Secondary | ICD-10-CM

## 2019-03-24 LAB — CBC
HCT: 46.4 % (ref 39.0–52.0)
Hemoglobin: 15.9 g/dL (ref 13.0–17.0)
MCH: 31.2 pg (ref 26.0–34.0)
MCHC: 34.3 g/dL (ref 30.0–36.0)
MCV: 91 fL (ref 80.0–100.0)
Platelets: 414 10*3/uL — ABNORMAL HIGH (ref 150–400)
RBC: 5.1 MIL/uL (ref 4.22–5.81)
RDW: 12.6 % (ref 11.5–15.5)
WBC: 21.7 10*3/uL — ABNORMAL HIGH (ref 4.0–10.5)
nRBC: 0 % (ref 0.0–0.2)

## 2019-03-24 LAB — POCT I-STAT 7, (LYTES, BLD GAS, ICA,H+H)
Acid-base deficit: 4 mmol/L — ABNORMAL HIGH (ref 0.0–2.0)
Acid-base deficit: 5 mmol/L — ABNORMAL HIGH (ref 0.0–2.0)
Bicarbonate: 21.2 mmol/L (ref 20.0–28.0)
Bicarbonate: 23.3 mmol/L (ref 20.0–28.0)
Calcium, Ion: 1.1 mmol/L — ABNORMAL LOW (ref 1.15–1.40)
Calcium, Ion: 1.18 mmol/L (ref 1.15–1.40)
HCT: 48 % (ref 39.0–52.0)
HCT: 48 % (ref 39.0–52.0)
Hemoglobin: 16.3 g/dL (ref 13.0–17.0)
Hemoglobin: 16.3 g/dL (ref 13.0–17.0)
O2 Saturation: 100 %
O2 Saturation: 100 %
Patient temperature: 99.3
Potassium: 2.8 mmol/L — ABNORMAL LOW (ref 3.5–5.1)
Potassium: 3.6 mmol/L (ref 3.5–5.1)
Sodium: 138 mmol/L (ref 135–145)
Sodium: 139 mmol/L (ref 135–145)
TCO2: 22 mmol/L (ref 22–32)
TCO2: 25 mmol/L (ref 22–32)
pCO2 arterial: 42.6 mmHg (ref 32.0–48.0)
pCO2 arterial: 50.1 mmHg — ABNORMAL HIGH (ref 32.0–48.0)
pH, Arterial: 7.278 — ABNORMAL LOW (ref 7.350–7.450)
pH, Arterial: 7.304 — ABNORMAL LOW (ref 7.350–7.450)
pO2, Arterial: 306 mmHg — ABNORMAL HIGH (ref 83.0–108.0)
pO2, Arterial: 342 mmHg — ABNORMAL HIGH (ref 83.0–108.0)

## 2019-03-24 LAB — BASIC METABOLIC PANEL
Anion gap: 10 (ref 5–15)
BUN: 17 mg/dL (ref 8–23)
CO2: 22 mmol/L (ref 22–32)
Calcium: 8.2 mg/dL — ABNORMAL LOW (ref 8.9–10.3)
Chloride: 106 mmol/L (ref 98–111)
Creatinine, Ser: 1.29 mg/dL — ABNORMAL HIGH (ref 0.61–1.24)
GFR calc Af Amer: >60 mL/min (ref >60)
GFR calc non Af Amer: 58 mL/min — ABNORMAL LOW (ref >60)
Glucose, Bld: 161 mg/dL — ABNORMAL HIGH (ref 70–99)
Potassium: 3.7 mmol/L (ref 3.5–5.1)
Sodium: 138 mmol/L (ref 135–145)

## 2019-03-24 LAB — RAPID URINE DRUG SCREEN, HOSP PERFORMED
Amphetamines: NOT DETECTED
Barbiturates: NOT DETECTED
Benzodiazepines: POSITIVE — AB
Cocaine: NOT DETECTED
Opiates: NOT DETECTED
Tetrahydrocannabinol: NOT DETECTED

## 2019-03-24 LAB — GLUCOSE, CAPILLARY
Glucose-Capillary: 95 mg/dL (ref 70–99)
Glucose-Capillary: 121 mg/dL — ABNORMAL HIGH (ref 70–99)
Glucose-Capillary: 113 mg/dL — ABNORMAL HIGH (ref 70–99)
Glucose-Capillary: 138 mg/dL — ABNORMAL HIGH (ref 70–99)

## 2019-03-24 LAB — HIV ANTIBODY (ROUTINE TESTING W REFLEX): HIV Screen 4th Generation wRfx: NONREACTIVE

## 2019-03-24 LAB — ECHOCARDIOGRAM COMPLETE
Height: 68 in
Weight: 3200 oz

## 2019-03-24 LAB — SARS CORONAVIRUS 2 (TAT 6-24 HRS): SARS Coronavirus 2: NEGATIVE

## 2019-03-24 LAB — MAGNESIUM: Magnesium: 2 mg/dL (ref 1.7–2.4)

## 2019-03-24 LAB — MRSA PCR SCREENING: MRSA by PCR: NEGATIVE

## 2019-03-24 LAB — POCT ACTIVATED CLOTTING TIME: Activated Clotting Time: 395 seconds

## 2019-03-24 LAB — TROPONIN I (HIGH SENSITIVITY): Troponin I (High Sensitivity): >27000 ng/L (ref <18)

## 2019-03-24 MED ORDER — PANTOPRAZOLE SODIUM 40 MG IV SOLR
40.0000 mg | Freq: Every day | INTRAVENOUS | Status: DC
  Administered 2019-03-24: 40 mg via INTRAVENOUS
  Filled 2019-03-24: qty 40

## 2019-03-24 MED ORDER — PANTOPRAZOLE SODIUM 40 MG PO TBEC
40.0000 mg | DELAYED_RELEASE_TABLET | Freq: Every day | ORAL | Status: DC
  Administered 2019-03-25: 09:00:00 40 mg via ORAL
  Filled 2019-03-24: qty 1

## 2019-03-24 MED ORDER — ORAL CARE MOUTH RINSE
15.0000 mL | OROMUCOSAL | Status: DC
  Administered 2019-03-24 (×3): 15 mL via OROMUCOSAL

## 2019-03-24 MED ORDER — METOPROLOL TARTRATE 5 MG/5ML IV SOLN
2.5000 mg | Freq: Four times a day (QID) | INTRAVENOUS | Status: DC
  Administered 2019-03-24 – 2019-03-25 (×3): 2.5 mg via INTRAVENOUS
  Filled 2019-03-24 (×3): qty 5

## 2019-03-24 MED ORDER — INSULIN ASPART 100 UNIT/ML ~~LOC~~ SOLN
0.0000 [IU] | SUBCUTANEOUS | Status: DC
  Administered 2019-03-24 – 2019-03-26 (×3): 2 [IU] via SUBCUTANEOUS

## 2019-03-24 MED ORDER — CHLORHEXIDINE GLUCONATE CLOTH 2 % EX PADS
6.0000 | MEDICATED_PAD | Freq: Every day | CUTANEOUS | Status: DC
  Administered 2019-03-24 – 2019-03-25 (×2): 6 via TOPICAL

## 2019-03-24 MED ORDER — METOPROLOL TARTRATE 5 MG/5ML IV SOLN
2.5000 mg | Freq: Once | INTRAVENOUS | Status: AC
  Administered 2019-03-24: 2.5 mg via INTRAVENOUS

## 2019-03-24 MED ORDER — PERFLUTREN LIPID MICROSPHERE
1.0000 mL | INTRAVENOUS | Status: AC | PRN
  Administered 2019-03-24: 2 mL via INTRAVENOUS
  Filled 2019-03-24: qty 10

## 2019-03-24 MED ORDER — PANTOPRAZOLE SODIUM 40 MG PO PACK: 40.0000 mg | PACK | Freq: Every day | ORAL | Status: DC

## 2019-03-24 MED ORDER — PROMETHAZINE HCL 25 MG/ML IJ SOLN
12.5000 mg | Freq: Four times a day (QID) | INTRAMUSCULAR | Status: DC | PRN
  Administered 2019-03-24: 13:00:00 12.5 mg via INTRAVENOUS
  Filled 2019-03-24: qty 1

## 2019-03-24 MED ORDER — ATROPINE SULFATE 1 MG/10ML IJ SOSY
PREFILLED_SYRINGE | INTRAMUSCULAR | Status: AC
  Filled 2019-03-24: qty 10

## 2019-03-24 MED ORDER — CHLORHEXIDINE GLUCONATE 0.12% ORAL RINSE (MEDLINE KIT)
15.0000 mL | Freq: Two times a day (BID) | OROMUCOSAL | Status: DC
  Administered 2019-03-24: 08:00:00 15 mL via OROMUCOSAL

## 2019-03-24 MED ORDER — METOPROLOL TARTRATE 5 MG/5ML IV SOLN
INTRAVENOUS | Status: AC
  Filled 2019-03-24: qty 5

## 2019-03-24 NOTE — Progress Notes (Signed)
CRITICAL VALUE ALERT  Critical Value:  Troponin greater than 27,000  Date & Time Notied:  03/24/2019  0207  Provider Notified: Alric Quan, MD   Orders Received/Actions taken: Physician also called back about previous page for 16 beat run of vtach. Physician wants a call for sustained vtach/SVTs. RN will continue to monitor for sustained vtach/SVTs.

## 2019-03-24 NOTE — Consult Note (Signed)
NAME:  [name], MRN:  [id number], DOB:  May 13, 1956, LOS: 1 ADMISSION DATE:  03/23/2019, CONSULTATION DATE:  03/23/2019 REFERRING MD:  Dr. Kirke Shaggy, CHIEF COMPLAINT:  STEMI  Brief History   31 yoM presenting with large inferolateral STEMI, additionally uncooperative and writhing requiring intubation to safely proceed with LHC.  Successful PCI with DES placed to RCA SVG which was 100% stenosed.  Returns to ICU intubated, PCCM consulted for ventilator management.   History of present illness   HPI obtained from medical chart review as patient is sedated on mechanical ventilation.  No family at bedside.   63 year old male with history of CAD (PCI to [place] 08/2012, CABG 09/2012: LIMA-LAD, SVG-RCA, SVG-OM), HTN, HLD, CVA, borderline diabetes, and former smoker.    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  Patient was diaphoretic, writhing on stretcher, and uncooperative.  EKG noted for large inferolateral STEMI.  He was additionally tachycardic and hypertensive.  Patient remained uncooperative and nonverbal, therefore was intubated for safety to proceed with emergent heart catheterization.  Taken to cath lab after intubation with successful PCI with DES placed to RCA SVG which was 100% stenosed.  Returns to ICU intubated, PCCM consulted for ventilator management.   Past Medical History  CAD (PCI to [place] 08/2012, CABG 09/2012: LIMA-LAD, SVG-RCA, SVG-OM), HTN, HLD, CVA, borderline diabetes, former smoker  Significant Hospital Events   9/8 Admitted to Cardiology/ LHC / intubated   Consults:   Procedures:  9/8 ETT >>  9/8 LHC   Significant Diagnostic Tests:  9/8 LHC >>  Prox RCA lesion is 100% stenosed.  Prox Cx lesion is 100% stenosed.  Mid LAD lesion is 100% stenosed.  Origin to Prox Graft lesion is 100% stenosed.  Dist RCA lesion is 100% stenosed.  Post intervention, there is a 0%  residual stenosis.  A stent was successfully placed to RCA SVG  Post intervention, there is a 0% residual stenosis.  Micro Data:  9/8 COVID >> pending 9/8 MRSA PCR  >> pending  Antimicrobials:   Interim history/subjective:   Objective   Blood pressure (!) 124/98, pulse 93, resp. rate 11, height 5\' 8"  (1.727 m), weight 90.7 kg, SpO2 99 %.    Vent Mode: PRVC FiO2 (%):  [100 %] 100 % Set Rate:  [14 bmp-22 bmp] 22 bmp Vt Set:  [490 mL-540 mL] 540 mL PEEP:  [5 cmH20] 5 cmH20 Plateau Pressure:  [23 cmH20] 23 cmH20   Intake/Output Summary (Last 24 hours) at 03/24/2019 0133 Last data filed at 03/24/2019 0000 Gross per 24 hour  Intake 42.56 ml  Output -  Net 42.56 ml   Filed Weights   03/23/19 2047  Weight: 90.7 kg    Examination: General:  Adult male sedated on MV in NAD HEENT: MM pink/moist, ETT/ OGT Neuro: sedated on fentanyl and versed gtt CV: RR, no obvious murmur PULM:  MV support breaths, CTA, faint exp wheeze left anterior GI: +bs, condom catheter Extremities: cool, slightly mottled/dry, no LE edema  Skin: no rashes   9/8 CXR reviewed with satisfactory position of ETT, OGT below diaphram, low lung volumes, no acute process  Resolved Hospital Problem list    Assessment & Plan:   Acute respiratory insufficiency - requiring intubation for safety given AMS to proceed with LHC for STEMI  P:  To remain intubated overnight and till after sheath pulled Full MV support, PRVC 8 cc/kg  ABG reviewed- resp acidosis, rate at 15 but patient breathing at 20, will increase rate to 22, recheck ABG in 1 hour Trend CXR Albuterol prn  SBT in am if ok with cardiology and meets criteria PAD protocol with fentanyl/ versed for now  Inferolateral STEMI s/p LHC with successful PCI and DES placement to RCA SVG which was 100% stenosed - hx of CAD with prior CABG 2014 P:  Per Cardiology  Tele monitoring bivalirudin x 4 hrs post cath cangrelor d/c after ticagrelor given OGT Right  femoral sheath to be pulled around 0300 Daily ASA and statin  TTE this am   NSVT during LHC P:  Amiodarone gtt Goal Mag >2, K > 4  Hypertensive emergency  P:  cleviprex per cards   Acute encephalopathy  - likely due to severe pain r/t STEMI - wife reportedly denied any drug/ ETOH abuse P:  CTH ordered  Follow neuro exams Send UDS (already received benzo/ opiates) PAD protocol as above  AKI- improving  hypokalemia P:  Gentle hydration S/p KCL replete Trend BMP / urinary output Replace electrolytes as indicated Avoid nephrotoxic agents, ensure adequate renal perfusion  Leukocytosis - likely reactive P:  Monitor clinically/ trend WBC/ fever curve  Hyperglycemia P:  Assess A1c Add SSI if glucose > 180  Best practice:  Diet: NPO Pain/Anxiety/Delirium protocol (if indicated): Fent/ versed  VAP protocol (if indicated): yes DVT prophylaxis: SCDs, heparin SQ GI prophylaxis: PPI Glucose control: CBG q 4 Mobility: BR Code Status: full  Family Communication: per primary team.  No family at bedside.  Disposition: ICU  Labs   CBC: Recent Labs  Lab 03/23/19 2039 03/23/19 2045 03/24/19 0024 03/24/19 0035  WBC  --  16.9*  --  21.7*  HGB 17.0 16.7 16.3 15.9  HCT 50.0 48.7 48.0 46.4  MCV  --  92.1  --  91.0  PLT  --  423*  --  414*    Basic Metabolic Panel: Recent Labs  Lab 03/23/19 2039 03/23/19 2045 03/24/19 0024 03/24/19 0035  NA 141 137 138 138  K 3.0* 3.1* 3.6 3.7  CL 106 103  --  106  CO2  --  19*  --  22  GLUCOSE 169* 178*  --  161*  BUN 22 20  --  17  CREATININE 1.60*  1.60* 1.70*  --  1.29*  CALCIUM  --  8.7*  --  8.2*   GFR: Estimated Creatinine Clearance: 63.3 mL/min (A) (by C-G formula based on SCr of 1.29 mg/dL (H)). Recent Labs  Lab 03/23/19 2045 03/24/19 0035  WBC 16.9* 21.7*    Liver Function Tests: Recent Labs  Lab 03/23/19 2045  AST 31  ALT 24  ALKPHOS 64  BILITOT 0.5  PROT 7.1  ALBUMIN 3.7   No results for  input(s): LIPASE, AMYLASE in the last 168 hours. No results for input(s): AMMONIA in the last 168 hours.  ABG    Component Value Date/Time   PHART 7.278 (L) 03/24/2019 0024   PCO2ART 50.1 (H) 03/24/2019 0024   PO2ART 342.0 (H) 03/24/2019 0024   HCO3 23.3 03/24/2019 0024   TCO2 25 03/24/2019 0024   ACIDBASEDEF 4.0 (H) 03/24/2019 0024   O2SAT 100.0 03/24/2019 0024     Coagulation Profile: Recent Labs  Lab 03/23/19 2045  INR 1.0    Cardiac Enzymes: No results for input(s): CKTOTAL, CKMB, CKMBINDEX, TROPONINI in the last 168 hours.  HbA1C: Hgb A1c MFr Bld  Date/Time Value Ref Range Status  09/01/2012 03:00 AM 6.0 (H) <5.7 % Final    Comment:    (NOTE)                                                                       According to the ADA Clinical Practice Recommendations for 2011, when HbA1c is used as a screening test:  >=6.5%   Diagnostic of Diabetes Mellitus           (if abnormal result is confirmed) 5.7-6.4%   Increased risk of developing Diabetes Mellitus References:Diagnosis and Classification of Diabetes Mellitus,Diabetes Care,2011,34(Suppl 1):S62-S69 and Standards of Medical Care in         Diabetes - 2011,Diabetes [id number] (Suppl 1):S11-S61.  01/06/2011 08:40 PM 5.8 (H) <5.7 % Final    Comment:    (NOTE)                                                                       According to the ADA Clinical Practice Recommendations for 2011, when HbA1c is used as a screening test:  >=6.5%   Diagnostic of Diabetes Mellitus           (if abnormal result is confirmed) 5.7-6.4%   Increased risk of developing Diabetes Mellitus References:Diagnosis and Classification of Diabetes Mellitus,Diabetes [id number] 1):S62-S69 and Standards of Medical Care in         Diabetes - 2011,Diabetes Care,2011,34 (Suppl 1):S11-S61.    CBG: Recent Labs  Lab 03/23/19 2332  GLUCAP 167*    Review of Systems:   Unable   Past Medical History  He,  has a past medical  history of Borderline diabetic, Coronary artery disease, Dyslipidemia (01/11/2013), Hiatal hernia, Hypertension, Mini stroke ([place]), S/P CABG x 3, STEMI (ST elevation myocardial infarction) ([place]), and Stroke ([place]).   Surgical History    Past Surgical History:  Procedure Laterality Date  . CARDIAC CATHETERIZATION    . CORONARY ARTERY BYPASS GRAFT N/A 09/01/2012   Procedure: CORONARY ARTERY BYPASS GRAFTING (CABG);  Surgeon: Gaye Pollack, MD;  Location: [place];  Service: Open Heart Surgery;  Laterality: N/A;  Coronary Artery Bypass Grafting Times Three Using Left Internal Mammary Artery and Left Saphenous leg Vein Harvested Endoscopically  . INTRAOPERATIVE TRANSESOPHAGEAL ECHOCARDIOGRAM N/A 09/01/2012   Procedure: INTRAOPERATIVE TRANSESOPHAGEAL ECHOCARDIOGRAM;  Surgeon: Gaye Pollack, MD;  [facility] OR;  Service: Open Heart Surgery;  Laterality: N/A;  . LEFT HEART CATHETERIZATION WITH CORONARY ANGIOGRAM N/A 08/31/2012   Procedure: LEFT HEART CATHETERIZATION WITH CORONARY ANGIOGRAM;  Surgeon: Peter M Martinique, MD;  [facility] C[facility] LAB;  Service: Cardiovascular;  Laterality: N/A;  . PERCUTANEOUS CORONARY STENT INTERVENTION (PCI-S) N/A 08/31/2012   Procedure: PERCUTANEOUS CORONARY STENT INTERVENTION (PCI-S);  Surgeon: Peter M Martinique, MD;  Location: [facility] CATH LAB;  Service: Cardiovascular;  Laterality: N/A;     Social History   reports that he has quit smoking. His smoking use included pipe. He quit smokeless tobacco use about 50 years ago. He reports that he does not drink alcohol  or use drugs.   Family History   His family history includes Cancer in his father; Heart attack in his brother, father, and sister; Hyperlipidemia in his brother and father; Hypertension in his brother, father, and sister.   Allergies No Known Allergies   Home Medications  Prior to Admission medications   Not on File     Critical care time: 45 mins    Kennieth Rad, MSN, AGACNP-BC [facility] Pgr: [phone] or if no answer [phone] 03/24/2019, 2:20 AM

## 2019-03-24 NOTE — Progress Notes (Signed)
RT and RN transported pt from 2 H7 to CT and back without complications. Pt respiratory status is stable at this time on vent. RT will continue to monitor.

## 2019-03-24 NOTE — Progress Notes (Signed)
Patient had 16 beat run of v-tach. Patient's vital signs are stable. On call cardiologist paged.

## 2019-03-24 NOTE — Progress Notes (Signed)
Progress Note  Patient Name: [name] Date of Encounter: 03/24/2019  Primary Cardiologist: Sanda Klein, MD   Subjective   Was agitated and intubated prior to Cath Lab.  STEMI. Mild temperature 100.4-COVID negative. Was just extubated.  Nausea.  Diaphoretic. Inpatient Medications    Scheduled Meds: . aspirin  81 mg Oral Daily  . atorvastatin  80 mg Oral q1800  . chlorhexidine gluconate (MEDLINE KIT)  15 mL Mouth Rinse BID  . Chlorhexidine Gluconate Cloth  6 each Topical Daily  . fentaNYL (SUBLIMAZE) injection  50 mcg Intravenous Once  . mouth rinse  15 mL Mouth Rinse 10 times per day  . midazolam  3 mg Intravenous Once  . pantoprazole (PROTONIX) IV  40 mg Intravenous Daily  . sodium chloride flush  3 mL Intravenous Q12H  . ticagrelor  90 mg Oral BID   Continuous Infusions: . sodium chloride 75 mL/hr at 03/24/19 0800  . sodium chloride    . amiodarone 30 mg/hr (03/24/19 0913)  . clevidipine Stopped (03/24/19 0237)  . fentaNYL infusion INTRAVENOUS 75 mcg/hr (03/24/19 0800)  . midazolam 2 mg/hr (03/23/19 2149)   PRN Meds: sodium chloride, acetaminophen, fentaNYL, nitroGLYCERIN, ondansetron (ZOFRAN) IV, sodium chloride flush   Vital Signs    Vitals:   03/24/19 0732 03/24/19 0742 03/24/19 0800 03/24/19 0830  BP: 94/76  120/90 (!) 132/99  Pulse: 81  82 87  Resp: (!) 22  (!) 22 20  Temp:  (!) 100.4 F (38 C)    TempSrc:  Oral    SpO2: 98%  97% 99%  Weight:      Height:        Intake/Output Summary (Last 24 hours) at 03/24/2019 0920 Last data filed at 03/24/2019 0800 Gross per 24 hour  Intake 1124.62 ml  Output 800 ml  Net 324.62 ml   Last 3 Weights 03/23/2019 02/03/2013 01/11/2013  Weight (lbs) 200 lb 204 lb 11.2 oz 209 lb  Weight (kg) 90.719 kg 92.851 kg 94.802 kg      Telemetry    Brief runs of nonsustained ventricular tachycardia noted otherwise sinus rhythm- Personally Reviewed  ECG    Sinus rhythm, subtle ST elevation still noted in the inferior  leads.- Personally Reviewed  Physical Exam   GEN: No acute distress.   Neck: No JVD Cardiac: RRR, no murmurs, rubs, or gallops.  Respiratory: Clear to auscultation bilaterally. GI: Soft, nontender, non-distended  MS: No edema; No deformity. Neuro:  Nonfocal  Psych: Normal affect   Labs    High Sensitivity Troponin:   Recent Labs  Lab 03/23/19 2045 03/24/19 0026  TROPONINIHS 21* >27,000*      Chemistry Recent Labs  Lab 03/23/19 2039 03/23/19 2045 03/24/19 0024 03/24/19 0035  NA 141 137 138 138  K 3.0* 3.1* 3.6 3.7  CL 106 103  --  106  CO2  --  19*  --  22  GLUCOSE 169* 178*  --  161*  BUN 22 20  --  17  CREATININE 1.60*  1.60* 1.70*  --  1.29*  CALCIUM  --  8.7*  --  8.2*  PROT  --  7.1  --   --   ALBUMIN  --  3.7  --   --   AST  --  31  --   --   ALT  --  24  --   --   ALKPHOS  --  64  --   --   BILITOT  --  0.5  --   --  GFRNONAA  --  42*  --  58*  GFRAA  --  48*  --  >60  ANIONGAP  --  15  --  10     Hematology Recent Labs  Lab 03/23/19 2045 03/24/19 0024 03/24/19 0035  WBC 16.9*  --  21.7*  RBC 5.29  --  5.10  HGB 16.7 16.3 15.9  HCT 48.7 48.0 46.4  MCV 92.1  --  91.0  MCH 31.6  --  31.2  MCHC 34.3  --  34.3  RDW 12.5  --  12.6  PLT 423*  --  414*    BNPNo results for input(s): BNP, PROBNP in the last 168 hours.   DDimer No results for input(s): DDIMER in the last 168 hours.   Radiology    Ct Head Wo Contrast  Result Date: 03/24/2019 CLINICAL DATA:  Inpatient. Encephalopathy. History of hypertension, CVA and STEMI. No reported injury. EXAM: CT HEAD WITHOUT CONTRAST TECHNIQUE: Contiguous axial images were obtained from the base of the skull through the vertex without intravenous contrast. COMPARISON:  12/30/2012 head CT. FINDINGS: Brain: Tiny right basal ganglia lacune, new since prior CT. No evidence of parenchymal hemorrhage or extra-axial fluid collection. No mass lesion, mass effect, or midline shift. No CT evidence of acute  transcortical infarction. Nonspecific mild subcortical and periventricular white matter hypodensity, most in keeping with chronic small vessel ischemic change. Generalized cerebral volume loss. Cerebral ventricle sizes are stable and concordant with the degree of cerebral volume loss. Vascular: No acute abnormality. Skull: No evidence of calvarial fracture. Sinuses/Orbits: The visualized paranasal sinuses are essentially clear. Other: Oral route tubes enter the hypopharynx with the tips not seen on this study. The mastoid air cells are unopacified. IMPRESSION: 1. Tiny right basal ganglia lacune of uncertain chronicity, new from 2014 and CT. 2. Otherwise no evidence of acute intracranial abnormality. 3. Generalized cerebral volume loss and mild chronic small vessel ischemic changes in the cerebral white matter. Electronically Signed   By: Ilona Sorrel M.D.   On: 03/24/2019 08:35   Dg Chest Portable 1 View  Result Date: 03/23/2019 CLINICAL DATA:  Post intubation EXAM: PORTABLE CHEST 1 VIEW COMPARISON:  03/23/2019, 09/28/2012 FINDINGS: Post sternotomy changes. Placement of endotracheal tube, the tip is about 3.4 cm superior to the carina. Esophageal tube tip is below the diaphragm but non included. Enlarged cardiomediastinal silhouette. Low lung volume. No acute airspace disease. IMPRESSION: 1. Endotracheal tube tip about 3.4 cm superior to carina. Esophageal tube tip below the diaphragm but non included 2. Low lung volumes.  Mild cardiomegaly. Electronically Signed   By: Donavan Foil M.D.   On: 03/23/2019 20:56   Dg Chest Portable 1 View  Result Date: 03/23/2019 CLINICAL DATA:  Chest pain for 1 hour. EXAM: PORTABLE CHEST 1 VIEW COMPARISON:  09/28/2012 FINDINGS: Previous median sternotomy and CABG procedure. Heart size appears enlarged. No pleural effusion or edema. No airspace opacities. IMPRESSION: 1. No active disease. 2. Cardiac enlargement. Electronically Signed   By: Kerby Moors M.D.   On: 03/23/2019  20:42    Cardiac Studies    Prox RCA lesion is 100% stenosed.  Prox Cx lesion is 100% stenosed.  Mid LAD lesion is 100% stenosed.  Origin to Prox Graft lesion is 100% stenosed.  Dist RCA lesion is 100% stenosed.  Post intervention, there is a 0% residual stenosis.  A stent was successfully placed.  Post intervention, there is a 0% residual stenosis.   Diagnostic Dominance: Right  Intervention  Patient Profile     63 y.o. male with inferior STEMI secondary to SVG to PDA graft thrombosis, successful PCI, agitation with intubation prior to cardiac catheterization.  Assessment & Plan    Inferior STEMI -SVG to RCA/PDA angioplasty.  Successful.  Dual antiplatelet therapy.  High intensity statin.  Aspirin.  Discussed with pharmacy team.  Echo pending.  Cardiac rehab.  Coronary artery disease -(PCI to [place] 08/2012, CABG 09/2012: LIMA-LAD, SVG-RCA, SVG-OM).  PTCA/stent to SVG to RCA.  Ventricular arrhythmia/nonsustained ventricular tachycardia - Placed on IV amiodarone.  Transient.  Beta-blocker.  Reperfusion.  Kidney injury -Creatinine 1.7 on admit, currently 1.3  Leukocytosis -21,000.  Likely reactive.  Critical care team following.  Extubated.  CRITICAL CARE Performed by: Candee Furbish   Total critical care time: 35 minutes  Critical care time was exclusive of separately billable procedures and treating other patients.  Critical care was necessary to treat or prevent imminent or life-threatening deterioration.  Critical care was time spent personally by me on the following activities: development of treatment plan with patient and/or surrogate as well as nursing, discussions with consultants, evaluation of patient's response to treatment, examination of patient, obtaining history from patient or surrogate, ordering and performing treatments and interventions, ordering and review of laboratory studies, ordering and review of radiographic studies, pulse oximetry  and re-evaluation of patient's condition.   For questions or updates, please contact [place] Please consult [email or web address] for contact info under        Signed, Candee Furbish, MD  03/24/2019, 9:20 AM

## 2019-03-24 NOTE — Progress Notes (Signed)
RN attempted to call spouse of patient. The person who answered said it was the wrong number.

## 2019-03-24 NOTE — Progress Notes (Signed)
NAME:  [name], MRN:  [id number], DOB:  22-Oct-1955, LOS: 1 ADMISSION DATE:  03/23/2019, CONSULTATION DATE:  03/23/2019 REFERRING MD:  Dr. Kirke Shaggy, CHIEF COMPLAINT:  STEMI  Brief History   35 yoM presenting with large inferolateral STEMI, additionally uncooperative and writhing requiring intubation to safely proceed with LHC.  Successful PCI with DES placed to RCA SVG which was 100% stenosed.  Returns to ICU intubated, PCCM consulted for ventilator management.   History of present illness   HPI obtained from medical chart review as patient is sedated on mechanical ventilation.  No family at bedside.   63 year old male with history of CAD (PCI to [place] 08/2012, CABG 09/2012: LIMA-LAD, SVG-RCA, SVG-OM), HTN, HLD, CVA, borderline diabetes, and former smoker.    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  Patient was diaphoretic, writhing on stretcher, and uncooperative.  EKG noted for large inferolateral STEMI.  He was additionally tachycardic and hypertensive.  Patient remained uncooperative and nonverbal, therefore was intubated for safety to proceed with emergent heart catheterization.  Taken to cath lab after intubation with successful PCI with DES placed to RCA SVG which was 100% stenosed.  Returns to ICU intubated, PCCM consulted for ventilator management.   Past Medical History  CAD (PCI to [place] 08/2012, CABG 09/2012: LIMA-LAD, SVG-RCA, SVG-OM), HTN, HLD, CVA, borderline diabetes, former smoker  Significant Hospital Events   9/8 Admitted to Cardiology/ LHC / intubated   Consults:   Procedures:  9/8 ETT >>9/9 9/8 LHC >>DES to RCA  Significant Diagnostic Tests:  9/8 LHC >>  Prox RCA lesion is 100% stenosed.  Prox Cx lesion is 100% stenosed.  Mid LAD lesion is 100% stenosed.  Origin to Prox Graft lesion is 100% stenosed.  Dist RCA lesion is 100% stenosed.  Post intervention, there  is a 0% residual stenosis.  A stent was successfully placed to RCA SVG  Post intervention, there is a 0% residual stenosis.  Micro Data:  9/8 COVID >> negative 9/8 MRSA PCR  >> neg  Antimicrobials:   Interim history/subjective:  Awake and responsive this morning, attempted to pull ET tube, so was successfully extubated     Objective   Blood pressure 91/68, pulse 80, temperature 99.1 F (37.3 C), temperature source Oral, resp. rate 17, height 5\' 8"  (1.727 m), weight 90.7 kg, SpO2 99 %.    Vent Mode: PRVC FiO2 (%):  [40 %-100 %] 40 % Set Rate:  [14 bmp-22 bmp] 22 bmp Vt Set:  [490 mL-540 mL] 540 mL PEEP:  [5 cmH20] 5 cmH20 Plateau Pressure:  [22 cmH20-23 cmH20] 22 cmH20   Intake/Output Summary (Last 24 hours) at 03/24/2019 0734 Last data filed at 03/24/2019 0600 Gross per 24 hour  Intake 885.96 ml  Output 800 ml  Net 85.96 ml   Filed Weights   03/23/19 2047  Weight: 90.7 kg    General:  Awake and responsive HEENT: MM pink/moist Neuro: Following commands, opens eyes and nods head to questions CV: s1s2, RRR no m/r/g PULM:  CTAB, successfully extubated to [place] with signs of sleep apnea, but no respiratory distress GI: soft, bsx4 active  Extremities: warm/dry, no edema  Skin: no rashes or lesions   Resolved Hospital Problem list   Respiratory failure   Assessment & Plan:   Acute respiratory insufficiency - requiring intubation for safety given AMS to proceed with LHC for STEMI  P:  -Successfully  extubated and protecting airway -Continue to monitor respiratory status today   Inferolateral STEMI s/p LHC with successful PCI and DES placement to RCA SVG which was 100% stenosed - hx of CAD with prior CABG 2014 P:  -Management per Cardiology, on [place] -TTE today - s/p bivalirudin x 4 hrs post cath -Cont. ASA and statin   NSVT during LHC P:  Amiodarone gtt, sinus rhythm today, wean per cardiology Goal Mag >2, K > 4  Hypertensive emergency   -Required cleviprex overnight, now discontinued P:  -Continue Metoprolol, BP controlled today  Acute encephalopathy  - likely due to severe pain r/t STEMI - wife reportedly denied any drug/ ETOH abuse -UDS positive only for Benzos P:  CTH showed small lacunar infarcts of  Uncertain chronicity Follow neuro exams   AKI-  -Creatinine improving to 1.2 this morning P: -continue to monitor renal function and UOP '  Leukocytosis - likely reactive, pt afebrile P:  Monitor clinically/ trend WBC/ fever curve  Hyperglycemia P:  -A1c pending -Add SSI   Best practice:  Diet: NPO Pain/Anxiety/Delirium protocol (if indicated): Fent/ versed  VAP protocol (if indicated): yes DVT prophylaxis: SCDs, heparin SQ GI prophylaxis: PPI Glucose control: CBG q 4 Mobility: BR Code Status: full  Family Communication: per primary team.  No family at bedside.  Disposition: ICU  Labs   CBC: Recent Labs  Lab 03/23/19 2039 03/23/19 2045 03/24/19 0024 03/24/19 0035  WBC  --  16.9*  --  21.7*  HGB 17.0 16.7 16.3 15.9  HCT 50.0 48.7 48.0 46.4  MCV  --  92.1  --  91.0  PLT  --  423*  --  414*    Basic Metabolic Panel: Recent Labs  Lab 03/23/19 2039 03/23/19 2045 03/24/19 0024 03/24/19 0035  NA 141 137 138 138  K 3.0* 3.1* 3.6 3.7  CL 106 103  --  106  CO2  --  19*  --  22  GLUCOSE 169* 178*  --  161*  BUN 22 20  --  17  CREATININE 1.60*  1.60* 1.70*  --  1.29*  CALCIUM  --  8.7*  --  8.2*  MG  --   --   --  2.0   GFR: Estimated Creatinine Clearance: 63.3 mL/min (A) (by C-G formula based on SCr of 1.29 mg/dL (H)). Recent Labs  Lab 03/23/19 2045 03/24/19 0035  WBC 16.9* 21.7*    Liver Function Tests: Recent Labs  Lab 03/23/19 2045  AST 31  ALT 24  ALKPHOS 64  BILITOT 0.5  PROT 7.1  ALBUMIN 3.7   No results for input(s): LIPASE, AMYLASE in the last 168 hours. No results for input(s): AMMONIA in the last 168 hours.  ABG    Component Value Date/Time   PHART  7.278 (L) 03/24/2019 0024   PCO2ART 50.1 (H) 03/24/2019 0024   PO2ART 342.0 (H) 03/24/2019 0024   HCO3 23.3 03/24/2019 0024   TCO2 25 03/24/2019 0024   ACIDBASEDEF 4.0 (H) 03/24/2019 0024   O2SAT 100.0 03/24/2019 0024     Coagulation Profile: Recent Labs  Lab 03/23/19 2045  INR 1.0    Cardiac Enzymes: No results for input(s): CKTOTAL, CKMB, CKMBINDEX, TROPONINI in the last 168 hours.  HbA1C: Hgb A1c MFr Bld  Date/Time Value Ref Range Status  09/01/2012 03:00 AM 6.0 (H) <5.7 % Final    Comment:    (NOTE)  According to the ADA Clinical Practice Recommendations for 2011, when HbA1c is used as a screening test:  >=6.5%   Diagnostic of Diabetes Mellitus           (if abnormal result is confirmed) 5.7-6.4%   Increased risk of developing Diabetes Mellitus References:Diagnosis and Classification of Diabetes Mellitus,Diabetes Care,2011,34(Suppl 1):S62-S69 and Standards of Medical Care in         Diabetes - 2011,Diabetes [id number] (Suppl 1):S11-S61.  01/06/2011 08:40 PM 5.8 (H) <5.7 % Final    Comment:    (NOTE)                                                                       According to the ADA Clinical Practice Recommendations for 2011, when HbA1c is used as a screening test:  >=6.5%   Diagnostic of Diabetes Mellitus           (if abnormal result is confirmed) 5.7-6.4%   Increased risk of developing Diabetes Mellitus References:Diagnosis and Classification of Diabetes Mellitus,Diabetes [id number] 1):S62-S69 and Standards of Medical Care in         Diabetes - 2011,Diabetes Care,2011,34 (Suppl 1):S11-S61.    CBG: Recent Labs  Lab 03/23/19 2332 03/24/19 0701  GLUCAP 167* 95   CRITICAL CARE Performed by: Otilio Carpen Arleen Bar   Total critical care time: 45 minutes  Critical care time was exclusive of separately billable procedures and treating other patients.  Critical care was necessary  to treat or prevent imminent or life-threatening deterioration secondary to STEMI and acute hypoxic respiratory failure requiring ventilator management  Critical care was time spent personally by me on the following activities: development of treatment plan with patient and/or surrogate as well as nursing, discussions with consultants, evaluation of patient's response to treatment, examination of patient, obtaining history from patient or surrogate, ordering and performing treatments and interventions, ordering and review of laboratory studies, ordering and review of radiographic studies, pulse oximetry and re-evaluation of patient's condition.    Otilio Carpen Itzia Cunliffe, PA-C [place] [facility]  Pager# [phone], if no answer [phone]

## 2019-03-24 NOTE — Procedures (Signed)
Extubation Procedure Note  Patient Details:   Name: Kevante Nardini DOB: 1955-12-23 MRN: [id number]   Airway Documentation:    Vent end date: 03/24/19 Vent end time: 0925   Evaluation  O2 sats: stable throughout Complications: No apparent complications Patient did tolerate procedure well. Bilateral Breath Sounds: Clear, Diminished   Yes   RT extubated patient per NP order to 3L [place]. RN and NP at bedside. Positive cuff leak noted. Patient tolerated well and able to speak. No stridor noted. RT will continue to monitor as needed.    Vernona Rieger 03/24/2019, 9:39 AM

## 2019-03-24 NOTE — Progress Notes (Signed)
Received [facility] page around 8:00 pm and was told family was in the ED waiting area and Mr. Brosey was in the Cath Lab.  Retrieved mother of Mr. Field's wife from ED and walked her to [place] to be with her daughter in their waiting area.    Let them know to have me paged if needed.  Family seemed to be able to support each other at that time and did not need me to stay.

## 2019-03-24 NOTE — Progress Notes (Signed)
  Echocardiogram 2D Echocardiogram has been performed.  Jennette Dubin 03/24/2019, 10:49 AM

## 2019-03-24 NOTE — Progress Notes (Signed)
RT obtained ABG on pt as ordered with the following results and changes. FiO2 decreased from 100% to 40% for PaO2 of 342. RT will continue to monitor.     Results for BYNUM, REIGEL (MRN [id number]) as of 03/24/2019 00:30  Ref. Range 03/24/2019 00:24  Sample type Unknown ARTERIAL  pH, Arterial Latest Ref Range: 7.350 - 7.450  7.278 (L)  pCO2 arterial Latest Ref Range: 32.0 - 48.0 mmHg 50.1 (H)  pO2, Arterial Latest Ref Range: 83.0 - 108.0 mmHg 342.0 (H)  TCO2 Latest Ref Range: 22 - 32 mmol/L 25  Acid-base deficit Latest Ref Range: 0.0 - 2.0 mmol/L 4.0 (H)  Bicarbonate Latest Ref Range: 20.0 - 28.0 mmol/L 23.3  O2 Saturation Latest Units: % 100.0  Patient temperature Unknown 99.3 F  Collection site Unknown ARTERIAL LINE

## 2019-03-24 NOTE — Progress Notes (Signed)
NAME:  [name], MRN:  [id number], DOB:  1955-10-27, LOS: 1 ADMISSION DATE:  03/23/2019, CONSULTATION DATE:  03/23/2019 REFERRING MD:  Dr. Kirke Shaggy, CHIEF COMPLAINT: Severe CP,  STEMI  Brief History   44 yoM presenting with large inferolateral STEMI, additionally uncooperative and writhing requiring intubation to safely proceed with LHC.  Successful PCI with DES placed to RCA SVG which was 100% stenosed.  Returned to ICU intubated, PCCM consulted for ventilator management. Successfully extubated in the AM of 9/9 and now on 3L [place].  History of present illness   HPI obtained from medical chart review as patient is sedated on mechanical ventilation.  No family at bedside.   63 year old male with history of CAD (PCI to [place] 08/2012, CABG 09/2012: LIMA-LAD, SVG-RCA, SVG-OM), HTN, HLD, CVA, borderline diabetes, and former smoker.    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  Patient was diaphoretic, writhing on stretcher, and uncooperative.  EKG noted for large inferolateral STEMI.  He was additionally tachycardic and hypertensive.  Patient remained uncooperative and nonverbal, therefore was intubated for safety to proceed with emergent heart catheterization.  Taken to cath lab after intubation with successful PCI with DES placed to RCA SVG which was 100% stenosed.  Returns to ICU intubated, PCCM consulted for ventilator management. Now extubated and on 3L [place]  Past Medical History  CAD (PCI to [place] 08/2012, CABG 09/2012: LIMA-LAD, SVG-RCA, SVG-OM) HTN  HLD CVA Borderline diabetes Former smoker  [facility] Events   9/8 Admitted to Cardiology/ LHC with PCI placement in RCA SVG / intubated  9/9 Extubated  Consults:  Cardiology PCCM  Procedures:  9/8 ETT >>9/9 9/8 LHC >>DES to RCA  Significant Diagnostic Tests:  9/8 LHC >>  Prox RCA lesion is 100% stenosed.  Prox Cx lesion is 100%  stenosed.  Mid LAD lesion is 100% stenosed.  Origin to Prox Graft lesion is 100% stenosed.  Dist RCA lesion is 100% stenosed.  Post intervention, there is a 0% residual stenosis.  A stent was successfully placed to RCA SVG  Post intervention, there is a 0% residual stenosis.  Micro Data:  9/8 COVID >> negative 9/8 MRSA PCR  >> neg  Antimicrobials:     Interim history/subjective:  This AM, continued to be combative and attempted to pull ET tube prior to successful extubation. Bout of unsustained vtach for 16 beats this AM, placed on amiodarone and labetalol. Femoral sheath removed without complications this AM.   Objective   Blood pressure (!) 147/98, pulse (!) 107, temperature (!) 100.4 F (38 C), temperature source Oral, resp. rate 18, height 5\' 8"  (1.727 m), weight 90.7 kg, SpO2 (!) 89 %.    Vent Mode: PRVC FiO2 (%):  [40 %-100 %] 40 % Set Rate:  [14 bmp-22 bmp] 22 bmp Vt Set:  [490 mL-540 mL] 540 mL PEEP:  [5 cmH20] 5 cmH20 Plateau Pressure:  [21 cmH20-23 cmH20] 21 cmH20   Intake/Output Summary (Last 24 hours) at 03/24/2019 1049 Last data filed at 03/24/2019 0800 Gross per 24 hour  Intake 1124.62 ml  Output 800 ml  Net 324.62 ml   Filed Weights   03/23/19 2047  Weight: 90.7 kg    Examination: General: Older gentleman, initially asleep in bed HEENT: Normocephalic, atraumatic. Anicteric sclerae. Neck: Supple, JVD appreciated to 3 cm above clavicle Lungs: Clear to auscultation with normal work of breathing and symmetric chest wall  expansion on 3L [place] Cardiovascular: Tachycardic with regular rhythm. Normal S1 and S2 with no murmurs, rubs, or gallops. 2+ radial pulses.  Abdomen: Normoactive bowel sounds. Non-tender, non-distended Extremities: Trace bilateral lower extremity edema. Warm and well-perfused. Neuro: Awakens to sound and touch, able to follow commands. Pupils round, equal, and reactive to light MSK: Normal muscle bulk and tone  Resolved Hospital Problem  list   Respiratory failure  Assessment & Plan:  Acute Respiratory Insufficiency- Initially required sedation and intubation for safety given AMS prior to [place] - Extubated this AM to 3L [place], wean as tolerated - Continue to monitor  Inferolateral STEMI- s/p LHC with successful PCI and DES placement to previously 100% stenosed RCA SVG  - Cardiology consulting - Continue ASA and Brilinta - TTE today - Continue high-intensity statin  NSVT- NSVT seen during LHC and again this AM for 16 beats -Continue IV amiodarone, wean per cardiology - Goal Mg>2, K>4  Hypertensive emergency- Required cleviprex overnight, now discontinued -Continue Metoprolol, BP controlled today  Acute encephalopathy- Patient was noted to be combative with EMS prior to admission. Patient's wife denies any knowledge of drug/ETOH abuse. Urine tox only positive for benzodiazepines, which had been administered prior to testing. Has improved following extubation with patient following commands now. Initial presentation likely secondary to severe pain due to STEMI -Follow neuro exams   AKI- Cr was 1.7 on admission, is 1.2 this AM. Baseline appears to be ~1.2 based on previous EMR data -continue to monitor renal function and UOP   Leukocytosis- Likely reactive, will continue to monitor. -Monitor clinically/ trend WBC/ fever curve  Hyperglycemia -A1c pending -Add SSI  Best practice:  Diet: NPO Pain/Anxiety/Delirium protocol (if indicated): Fent/ versed  VAP protocol (if indicated): yes DVT prophylaxis: SCDs, heparin SQ GI prophylaxis: PPI Glucose control: CBG q 4 Mobility: BR Code Status: full  Family Communication: per primary team.  No family at bedside.  Disposition: ICU  Labs   CBC: Recent Labs  Lab 03/23/19 2039 03/23/19 2045 03/24/19 0024 03/24/19 0035  WBC  --  16.9*  --  21.7*  HGB 17.0 16.7 16.3 15.9  HCT 50.0 48.7 48.0 46.4  MCV  --  92.1  --  91.0  PLT  --  423*  --  414*    Basic  Metabolic Panel: Recent Labs  Lab 03/23/19 2039 03/23/19 2045 03/24/19 0024 03/24/19 0035  NA 141 137 138 138  K 3.0* 3.1* 3.6 3.7  CL 106 103  --  106  CO2  --  19*  --  22  GLUCOSE 169* 178*  --  161*  BUN 22 20  --  17  CREATININE 1.60*  1.60* 1.70*  --  1.29*  CALCIUM  --  8.7*  --  8.2*  MG  --   --   --  2.0   GFR: Estimated Creatinine Clearance: 63.3 mL/min (A) (by C-G formula based on SCr of 1.29 mg/dL (H)). Recent Labs  Lab 03/23/19 2045 03/24/19 0035  WBC 16.9* 21.7*    Liver Function Tests: Recent Labs  Lab 03/23/19 2045  AST 31  ALT 24  ALKPHOS 64  BILITOT 0.5  PROT 7.1  ALBUMIN 3.7   No results for input(s): LIPASE, AMYLASE in the last 168 hours. No results for input(s): AMMONIA in the last 168 hours.  ABG    Component Value Date/Time   PHART 7.278 (L) 03/24/2019 0024   PCO2ART 50.1 (H) 03/24/2019 0024   PO2ART 342.0 (H) 03/24/2019 0024  HCO3 23.3 03/24/2019 0024   TCO2 25 03/24/2019 0024   ACIDBASEDEF 4.0 (H) 03/24/2019 0024   O2SAT 100.0 03/24/2019 0024     Coagulation Profile: Recent Labs  Lab 03/23/19 2045  INR 1.0    Cardiac Enzymes: No results for input(s): CKTOTAL, CKMB, CKMBINDEX, TROPONINI in the last 168 hours.  HbA1C: Hgb A1c MFr Bld  Date/Time Value Ref Range Status  09/01/2012 03:00 AM 6.0 (H) <5.7 % Final    Comment:    (NOTE)                                                                       According to the ADA Clinical Practice Recommendations for 2011, when HbA1c is used as a screening test:  >=6.5%   Diagnostic of Diabetes Mellitus           (if abnormal result is confirmed) 5.7-6.4%   Increased risk of developing Diabetes Mellitus References:Diagnosis and Classification of Diabetes Mellitus,Diabetes Care,2011,34(Suppl 1):S62-S69 and Standards of Medical Care in         Diabetes - 2011,Diabetes [id number] (Suppl 1):S11-S61.  01/06/2011 08:40 PM 5.8 (H) <5.7 % Final    Comment:    (NOTE)                                                                        According to the ADA Clinical Practice Recommendations for 2011, when HbA1c is used as a screening test:  >=6.5%   Diagnostic of Diabetes Mellitus           (if abnormal result is confirmed) 5.7-6.4%   Increased risk of developing Diabetes Mellitus References:Diagnosis and Classification of Diabetes Mellitus,Diabetes [id number] 1):S62-S69 and Standards of Medical Care in         Diabetes - 2011,Diabetes Care,2011,34 (Suppl 1):S11-S61.    CBG: Recent Labs  Lab 03/23/19 2332 03/24/19 0701  GLUCAP 167* 95    Review of Systems:   Negative except as noted above  Past Medical History  He,  has a past medical history of Borderline diabetic, Coronary artery disease, Dyslipidemia (01/11/2013), Hiatal hernia, Hypertension, Mini stroke ([place]), S/P CABG x 3, STEMI (ST elevation myocardial infarction) ([place]), and Stroke ([place]).   Surgical History    Past Surgical History:  Procedure Laterality Date  . CARDIAC CATHETERIZATION    . CORONARY ARTERY BYPASS GRAFT N/A 09/01/2012   Procedure: CORONARY ARTERY BYPASS GRAFTING (CABG);  Surgeon: Gaye Pollack, MD;  Location: [facility];  Service: Open Heart Surgery;  Laterality: N/A;  Coronary Artery Bypass Grafting Times Three Using Left Internal Mammary Artery and Left Saphenous leg Vein Harvested Endoscopically  . CORONARY STENT INTERVENTION N/A 03/23/2019   Procedure: CORONARY STENT INTERVENTION;  Surgeon: Lorretta Harp, MD;  Location: [facility];  Service: Cardiovascular;  Laterality: N/A;  . CORONARY/GRAFT ACUTE MI REVASCULARIZATION N/A 03/23/2019   Procedure: Coronary/Graft Acute MI Revascularization;  Surgeon: Lorretta Harp, MD;  Location: [facility];  Service: Cardiovascular;  Laterality: N/A;  . INTRAOPERATIVE TRANSESOPHAGEAL ECHOCARDIOGRAM N/A 09/01/2012   Procedure: INTRAOPERATIVE TRANSESOPHAGEAL ECHOCARDIOGRAM;  Surgeon: Gaye Pollack, MD;  Location: [facility];   Service: Open Heart Surgery;  Laterality: N/A;  . LEFT HEART CATH AND CORONARY ANGIOGRAPHY N/A 03/23/2019   Procedure: LEFT HEART CATH AND CORONARY ANGIOGRAPHY;  Surgeon: Lorretta Harp, MD;  Location: [facility];  Service: Cardiovascular;  Laterality: N/A;  . LEFT HEART CATHETERIZATION WITH CORONARY ANGIOGRAM N/A 08/31/2012   Procedure: LEFT HEART CATHETERIZATION WITH CORONARY ANGIOGRAM;  Surgeon: Peter M Martinique, MD;  Location: [facility] CATH LAB;  Service: Cardiovascular;  Laterality: N/A;  . PERCUTANEOUS CORONARY STENT INTERVENTION (PCI-S) N/A 08/31/2012   Procedure: PERCUTANEOUS CORONARY STENT INTERVENTION (PCI-S);  Surgeon: Peter M Martinique, MD;  Location[facility] CATH LAB;  Service: Cardiovascular;  Laterality: N/A;     Social History   reports that he has quit smoking. His smoking use included pipe. He quit smokeless tobacco use about 50 years ago. He reports that he does not drink alcohol or use drugs.   Family History   His family history includes Cancer in his father; Heart attack in his brother, father, and sister; Hyperlipidemia in his brother and father; Hypertension in his brother, father, and sister.   Allergies No Known Allergies   Home Medications  Prior to Admission medications   Not on File    Leonides Schanz, MS4

## 2019-03-24 NOTE — Progress Notes (Signed)
[facility] Progress Note Patient Name: [name] DOB: [birth year] MRN: [id number]   Date of Service  03/24/2019  HPI/Events of Note  Pt is s/p STEMI. He has scheduled iv Lopressor which is not due until midnight but his blood pressure is on the high side.  eICU Interventions  Lopressor 2.5 mg iv x 1 now.        Kerry Kass Sahmya Arai 03/24/2019, 8:10 PM

## 2019-03-24 NOTE — Progress Notes (Signed)
Femoral sheath removed at 0412. Vital signs remained stable throughout procedure. Pulses remained audible with doppler. Site is a level 0. Pressure dressing applied.

## 2019-03-25 ENCOUNTER — Other Ambulatory Visit: Payer: Self-pay

## 2019-03-25 DIAGNOSIS — I472 Ventricular tachycardia: Secondary | ICD-10-CM

## 2019-03-25 LAB — LIPID PANEL
Cholesterol: 164 mg/dL (ref 0–200)
HDL: 31 mg/dL — ABNORMAL LOW (ref >40)
LDL Cholesterol: 93 mg/dL (ref 0–99)
Total CHOL/HDL Ratio: 5.3 RATIO
Triglycerides: 198 mg/dL — ABNORMAL HIGH (ref <150)
VLDL: 40 mg/dL (ref 0–40)

## 2019-03-25 LAB — GLUCOSE, CAPILLARY
Glucose-Capillary: 100 mg/dL — ABNORMAL HIGH (ref 70–99)
Glucose-Capillary: 107 mg/dL — ABNORMAL HIGH (ref 70–99)
Glucose-Capillary: 109 mg/dL — ABNORMAL HIGH (ref 70–99)
Glucose-Capillary: 90 mg/dL (ref 70–99)
Glucose-Capillary: 95 mg/dL (ref 70–99)
Glucose-Capillary: 96 mg/dL (ref 70–99)

## 2019-03-25 LAB — BASIC METABOLIC PANEL
Anion gap: 9 (ref 5–15)
BUN: 15 mg/dL (ref 8–23)
CO2: 22 mmol/L (ref 22–32)
Calcium: 8.4 mg/dL — ABNORMAL LOW (ref 8.9–10.3)
Chloride: 108 mmol/L (ref 98–111)
Creatinine, Ser: 1.5 mg/dL — ABNORMAL HIGH (ref 0.61–1.24)
GFR calc Af Amer: 56 mL/min — ABNORMAL LOW (ref >60)
GFR calc non Af Amer: 49 mL/min — ABNORMAL LOW (ref >60)
Glucose, Bld: 105 mg/dL — ABNORMAL HIGH (ref 70–99)
Potassium: 3.8 mmol/L (ref 3.5–5.1)
Sodium: 139 mmol/L (ref 135–145)

## 2019-03-25 LAB — CBC
HCT: 43.6 % (ref 39.0–52.0)
Hemoglobin: 14.5 g/dL (ref 13.0–17.0)
MCH: 31.3 pg (ref 26.0–34.0)
MCHC: 33.3 g/dL (ref 30.0–36.0)
MCV: 94.2 fL (ref 80.0–100.0)
Platelets: 262 10*3/uL (ref 150–400)
RBC: 4.63 MIL/uL (ref 4.22–5.81)
RDW: 13.5 % (ref 11.5–15.5)
WBC: 14.6 10*3/uL — ABNORMAL HIGH (ref 4.0–10.5)
nRBC: 0 % (ref 0.0–0.2)

## 2019-03-25 LAB — HEMOGLOBIN A1C
Hgb A1c MFr Bld: 5.6 % (ref 4.8–5.6)
Mean Plasma Glucose: 114 mg/dL

## 2019-03-25 MED ORDER — POTASSIUM CHLORIDE CRYS ER 20 MEQ PO TBCR
20.0000 meq | EXTENDED_RELEASE_TABLET | Freq: Once | ORAL | Status: AC
  Administered 2019-03-25: 20 meq via ORAL
  Filled 2019-03-25: qty 1

## 2019-03-25 MED ORDER — INFLUENZA VAC SPLIT QUAD 0.5 ML IM SUSY
0.5000 mL | PREFILLED_SYRINGE | INTRAMUSCULAR | Status: AC
  Administered 2019-03-26: 10:00:00 0.5 mL via INTRAMUSCULAR
  Filled 2019-03-25: qty 0.5

## 2019-03-25 MED ORDER — METOPROLOL SUCCINATE ER 50 MG PO TB24
50.0000 mg | ORAL_TABLET | Freq: Every day | ORAL | Status: DC
  Administered 2019-03-25 – 2019-03-26 (×2): 50 mg via ORAL
  Filled 2019-03-25 (×2): qty 1

## 2019-03-25 MED ORDER — ORAL CARE MOUTH RINSE
15.0000 mL | Freq: Two times a day (BID) | OROMUCOSAL | Status: DC
  Administered 2019-03-25 – 2019-03-26 (×3): 15 mL via OROMUCOSAL

## 2019-03-25 MED ORDER — SACUBITRIL-VALSARTAN 49-51 MG PO TABS
1.0000 | ORAL_TABLET | Freq: Two times a day (BID) | ORAL | Status: DC
  Administered 2019-03-25 – 2019-03-26 (×3): 1 via ORAL
  Filled 2019-03-25 (×4): qty 1

## 2019-03-25 NOTE — Progress Notes (Signed)
CARDIAC REHAB PHASE I   PRE:  Rate/Rhythm: 75 SR  BP:  Supine: 129/94  Sitting:   Standing:    SaO2: 100%RA  MODE:  Ambulation: 300 ft   POST:  Rate/Rhythm: 87 SR  BP:  Supine:   Sitting: 148/98  Standing:    SaO2: 95%RA 1300-1408 Pt walked 300 ft on RA with gait belt use and asst x 1. Encouraged pt to stand upright as tendency to bend over. Held to side rail occasionally. Denied CP. Stopped once to rest. MI education completed with pt and wife who voiced understanding. Stressed importance of brilinta with stent. Reviewed NTG use, MI restrictions, walking for ex, watching sodium to 2000 mg restriction and heart healthy food choices, daily weights and when to call MD with weight gain, CRP 2. Referred to [place] CRP 2.  Pt is interested in participating in Virtual Cardiac and Pulmonary Rehab. Pt advised that Virtual Cardiac and Pulmonary Rehab is provided at no cost to the patient.  Checklist:  1. Pt has smart device  ie smartphone and/or ipad for downloading an app  Yes 2. Reliable internet/wifi service    Yes 3. Understands how to use their smartphone and navigate within an app.  Yes   Pt verbalized understanding and is in agreement.    Graylon Good, RN BSN  03/25/2019 2:04 PM

## 2019-03-25 NOTE — TOC Initial Note (Signed)
Transition of Care (TOC) - Initial/Assessment Note    Patient Details  Name: [name] MRN: [id number] Date of Birth: April 12, 1956  Transition of Care [facility]) CM/SW Contact:    Orbie Pyo Phone Number: 03/25/2019, 2:45 PM  Clinical Narrative:                         Patient Goals and CMS Choice        Expected Discharge Plan and Services                                                Prior Living Arrangements/Services                       Activities of Daily Living Home Assistive Devices/Equipment: None ADL Screening (condition at time of admission) Patient's cognitive ability adequate to safely complete daily activities?: Yes Is the patient deaf or have difficulty hearing?: No Does the patient have difficulty seeing, even when wearing glasses/contacts?: No Does the patient have difficulty concentrating, remembering, or making decisions?: No Patient able to express need for assistance with ADLs?: Yes Does the patient have difficulty dressing or bathing?: No Independently performs ADLs?: Yes (appropriate for developmental age) Does the patient have difficulty walking or climbing stairs?: No Weakness of Legs: None Weakness of Arms/Hands: None  Permission Sought/Granted                  Emotional Assessment              Admission diagnosis:  Stemi Patient Active Problem List   Diagnosis Date Noted  . Acute respiratory failure with hypoxia ([place])   . Altered mental status 03/23/2019  . Acute ST elevation myocardial infarction (STEMI) involving right coronary artery ([place]) 03/23/2019  . Status post CVA 02/03/2013  . Weakness 01/11/2013  . Dyslipidemia 01/11/2013  . CAD S/P CABG X 3 (LIMA to LAD, SVG to OM, SVG to RCA) -09/01/12 09/02/2012  . STEMI (ST elevation myocardial infarction) ([place]) 09/01/2012  . Hypertension   . Borderline diabetic   . Hiatal hernia    PCP:  [name], MD Pharmacy:   [facility], [address] [place] [address] Phone: [phone] Fax: [phone]     Social Determinants of Health (SDOH) Interventions    Readmission Risk Interventions No flowsheet data found.

## 2019-03-25 NOTE — Care Management (Signed)
03-25-19 Benefits Check submitted for Entresto and Brilinta. CM will discuss cost with patient once completed. Thanks Bethena Roys, RN,BSN Case Manager [phone]

## 2019-03-25 NOTE — TOC Benefit Eligibility Note (Signed)
Transition of Care [facility]) Benefit Eligibility Note    Patient Details  Name: [name] MRN: [id number] Date of Birth: 08-22-1955   Medication/Dose: Brilinta 90 mg po twice daily  Covered?: Yes  Tier: 2 Drug  Prescription Coverage Preferred Pharmacy: [facility] with Person/Company/Phone Number:: JULIA  Co-Pay: 47.00  Prior Approval: No  Deductible: (UNAVAILABLE)    THIS IS FOR A 30 [place] Phone Number: 03/25/2019, 2:53 PM

## 2019-03-25 NOTE — TOC Benefit Eligibility Note (Signed)
Transition of Care [facility]) Benefit Eligibility Note    Patient Details  Name: [name] MRN: [id number] Date of Birth: [birth year]   Medication/Dose: Sacubitril-valsartan 40mg  is 16.00 for a 30 day supply and is approved on plan  Covered?: No     Prescription Coverage [facility] with Person/Company/Phone Number:: Gregary Signs  Co-Pay: Delene Loll  Prior Approval: Yes  Deductible: (Unavailable)    [place] Phone Number: 03/25/2019, 2:39 PM

## 2019-03-25 NOTE — Progress Notes (Signed)
Progress Note  Patient Name: [name] Date of Encounter: 03/25/2019  Primary Cardiologist: Sanda Klein, MD   Subjective   This morning, feeling better, no complaints of chest discomfort, no shortness of breath, no further nausea.  Blood pressure remains elevated.  Did well postextubation.  Cath site clean dry and intact good pulses.  Dr. Gwenlyn Found came by to stop and say hello this morning  Inpatient Medications    Scheduled Meds: . aspirin  81 mg Oral Daily  . atorvastatin  80 mg Oral q1800  . Chlorhexidine Gluconate Cloth  6 each Topical Daily  . fentaNYL (SUBLIMAZE) injection  50 mcg Intravenous Once  . [START ON 03/26/2019] influenza vac split quadrivalent PF  0.5 mL Intramuscular Tomorrow-1000  . insulin aspart  0-15 Units Subcutaneous Q4H  . mouth rinse  15 mL Mouth Rinse BID  . metoprolol tartrate  2.5 mg Intravenous Q6H  . midazolam  3 mg Intravenous Once  . pantoprazole  40 mg Oral Daily  . sodium chloride flush  3 mL Intravenous Q12H  . ticagrelor  90 mg Oral BID   Continuous Infusions: . sodium chloride    . amiodarone 30 mg/hr (03/24/19 2151)   PRN Meds: sodium chloride, acetaminophen, nitroGLYCERIN, ondansetron (ZOFRAN) IV, promethazine, sodium chloride flush   Vital Signs    Vitals:   03/25/19 0700 03/25/19 0742 03/25/19 0800 03/25/19 0900  BP: (!) 152/105  (!) 150/113 (!) 142/105  Pulse: 74  81 79  Resp: 13  14 18   Temp:  99.1 F (37.3 C)    TempSrc:  Oral    SpO2: 99%  100% 98%  Weight:      Height:        Intake/Output Summary (Last 24 hours) at 03/25/2019 0915 Last data filed at 03/25/2019 0900 Gross per 24 hour  Intake 938.87 ml  Output 1200 ml  Net -261.13 ml   Last 3 Weights 03/23/2019 02/03/2013 01/11/2013  Weight (lbs) 200 lb 204 lb 11.2 oz 209 lb  Weight (kg) 90.719 kg 92.851 kg 94.802 kg      Telemetry    No atrial fibrillation, no ventricular tachycardia, sinus rhythm- Personally Reviewed  ECG    Sinus rhythm with  persistent ST elevation noted in the inferior leads- Personally Reviewed  Physical Exam   GEN: No acute distress.   Neck: No JVD Cardiac: RRR, no murmurs, rubs, or gallops.  Respiratory: Clear to auscultation bilaterally. GI: Soft, nontender, non-distended  MS: No edema; No deformity.  Mild tenderness over cath site, good pulses Neuro:  Nonfocal  Psych: Normal affect   Labs    High Sensitivity Troponin:   Recent Labs  Lab 03/23/19 2045 03/24/19 0026  TROPONINIHS 21* >27,000*      Chemistry Recent Labs  Lab 03/23/19 2045  03/24/19 0024 03/24/19 0035 03/25/19 0805  NA 137   < > 138 138 139  K 3.1*   < > 3.6 3.7 3.8  CL 103  --   --  106 108  CO2 19*  --   --  22 22  GLUCOSE 178*  --   --  161* 105*  BUN 20  --   --  17 15  CREATININE 1.70*  --   --  1.29* 1.50*  CALCIUM 8.7*  --   --  8.2* 8.4*  PROT 7.1  --   --   --   --   ALBUMIN 3.7  --   --   --   --  AST 31  --   --   --   --   ALT 24  --   --   --   --   ALKPHOS 64  --   --   --   --   BILITOT 0.5  --   --   --   --   GFRNONAA 42*  --   --  58* 49*  GFRAA 48*  --   --  >60 56*  ANIONGAP 15  --   --  10 9   < > = values in this interval not displayed.     Hematology Recent Labs  Lab 03/23/19 2045  03/24/19 0024 03/24/19 0035 03/25/19 0805  WBC 16.9*  --   --  21.7* 14.6*  RBC 5.29  --   --  5.10 4.63  HGB 16.7   < > 16.3 15.9 14.5  HCT 48.7   < > 48.0 46.4 43.6  MCV 92.1  --   --  91.0 94.2  MCH 31.6  --   --  31.2 31.3  MCHC 34.3  --   --  34.3 33.3  RDW 12.5  --   --  12.6 13.5  PLT 423*  --   --  414* 262   < > = values in this interval not displayed.    BNPNo results for input(s): BNP, PROBNP in the last 168 hours.   DDimer No results for input(s): DDIMER in the last 168 hours.   Radiology    Ct Head Wo Contrast  Result Date: 03/24/2019 CLINICAL DATA:  Inpatient. Encephalopathy. History of hypertension, CVA and STEMI. No reported injury. EXAM: CT HEAD WITHOUT CONTRAST TECHNIQUE:  Contiguous axial images were obtained from the base of the skull through the vertex without intravenous contrast. COMPARISON:  12/30/2012 head CT. FINDINGS: Brain: Tiny right basal ganglia lacune, new since prior CT. No evidence of parenchymal hemorrhage or extra-axial fluid collection. No mass lesion, mass effect, or midline shift. No CT evidence of acute transcortical infarction. Nonspecific mild subcortical and periventricular white matter hypodensity, most in keeping with chronic small vessel ischemic change. Generalized cerebral volume loss. Cerebral ventricle sizes are stable and concordant with the degree of cerebral volume loss. Vascular: No acute abnormality. Skull: No evidence of calvarial fracture. Sinuses/Orbits: The visualized paranasal sinuses are essentially clear. Other: Oral route tubes enter the hypopharynx with the tips not seen on this study. The mastoid air cells are unopacified. IMPRESSION: 1. Tiny right basal ganglia lacune of uncertain chronicity, new from 2014 and CT. 2. Otherwise no evidence of acute intracranial abnormality. 3. Generalized cerebral volume loss and mild chronic small vessel ischemic changes in the cerebral white matter. Electronically Signed   By: Ilona Sorrel M.D.   On: 03/24/2019 08:35   Dg Chest Portable 1 View  Result Date: 03/23/2019 CLINICAL DATA:  Post intubation EXAM: PORTABLE CHEST 1 VIEW COMPARISON:  03/23/2019, 09/28/2012 FINDINGS: Post sternotomy changes. Placement of endotracheal tube, the tip is about 3.4 cm superior to the carina. Esophageal tube tip is below the diaphragm but non included. Enlarged cardiomediastinal silhouette. Low lung volume. No acute airspace disease. IMPRESSION: 1. Endotracheal tube tip about 3.4 cm superior to carina. Esophageal tube tip below the diaphragm but non included 2. Low lung volumes.  Mild cardiomegaly. Electronically Signed   By: Donavan Foil M.D.   On: 03/23/2019 20:56   Dg Chest Portable 1 View  Result Date:  03/23/2019 CLINICAL DATA:  Chest pain for 1 hour.  EXAM: PORTABLE CHEST 1 VIEW COMPARISON:  09/28/2012 FINDINGS: Previous median sternotomy and CABG procedure. Heart size appears enlarged. No pleural effusion or edema. No airspace opacities. IMPRESSION: 1. No active disease. 2. Cardiac enlargement. Electronically Signed   By: Kerby Moors M.D.   On: 03/23/2019 20:42    Cardiac Studies   SVG to RCA/PDA stent  EF 35 to 40% on echo.  Hypokinesis inferior. Ascending aorta 42 mm  Patient Profile     63 y.o. male with inferior/inferior lateral ST elevation myocardial infarction with SVG to PDA/RCA bypass graft stenting in its proximal segment.  Has ongoing hypertension as well as ischemic cardiomyopathy EF 35 to 40%.  Assessment & Plan    Inferior STEMI - Dual antiplatelet therapy, high intensity statin, beta-blocker, cardiac rehab.  High-sensitivity troponin greater than 27,000.  Ischemic cardiomyopathy -EF 35 to 40% creatinine currently 1.5 -Appears euvolemic, LVEDP was 17 during cath. -I will start Entresto 49/51 given his elevated blood pressure, last check 142/105.  Seems to have the blood pressure to be able to tolerate the moderate dose. -I will start Toprol XL 50 mg once a day  Ventricular tachycardia -No further evidence of VT.  We will go ahead and stop his IV amiodarone.  Leukocytosis -White count has decreased from 21,000-14,000.  Reactive from MI.  No evidence of infection.  Mild hypokalemia - Potassium has been 3.6, 3.7, 3.8.  We will go ahead and give him low-dose of potassium 20 mEq x 1.  As we are starting the Entresto, his potassium should increase with the angiotensin receptor blocker.  We will go ahead and write transfer orders to telemetry.  Continue with cardiac rehab.  Walk today.  If he is feeling better, potential discharge tomorrow.  We will see how his blood pressures react to the medications.  For questions or updates, please contact [place] Please  consult [email or web address] for contact info under        Signed, Candee Furbish, MD  03/25/2019, 9:15 AM

## 2019-03-26 DIAGNOSIS — I77810 Thoracic aortic ectasia: Secondary | ICD-10-CM

## 2019-03-26 LAB — BASIC METABOLIC PANEL
Anion gap: 8 (ref 5–15)
BUN: 13 mg/dL (ref 8–23)
CO2: 26 mmol/L (ref 22–32)
Calcium: 8.1 mg/dL — ABNORMAL LOW (ref 8.9–10.3)
Chloride: 105 mmol/L (ref 98–111)
Creatinine, Ser: 1.37 mg/dL — ABNORMAL HIGH (ref 0.61–1.24)
GFR calc Af Amer: >60 mL/min (ref >60)
GFR calc non Af Amer: 54 mL/min — ABNORMAL LOW (ref >60)
Glucose, Bld: 105 mg/dL — ABNORMAL HIGH (ref 70–99)
Potassium: 3.6 mmol/L (ref 3.5–5.1)
Sodium: 139 mmol/L (ref 135–145)

## 2019-03-26 LAB — HEMOGLOBIN A1C
Hgb A1c MFr Bld: 5.6 % (ref 4.8–5.6)
Mean Plasma Glucose: 114 mg/dL

## 2019-03-26 LAB — GLUCOSE, CAPILLARY
Glucose-Capillary: 117 mg/dL — ABNORMAL HIGH (ref 70–99)
Glucose-Capillary: 125 mg/dL — ABNORMAL HIGH (ref 70–99)
Glucose-Capillary: 87 mg/dL (ref 70–99)

## 2019-03-26 LAB — CBC
HCT: 44.9 % (ref 39.0–52.0)
Hemoglobin: 14.8 g/dL (ref 13.0–17.0)
MCH: 30.9 pg (ref 26.0–34.0)
MCHC: 33 g/dL (ref 30.0–36.0)
MCV: 93.7 fL (ref 80.0–100.0)
Platelets: 266 10*3/uL (ref 150–400)
RBC: 4.79 MIL/uL (ref 4.22–5.81)
RDW: 13.2 % (ref 11.5–15.5)
WBC: 13.6 10*3/uL — ABNORMAL HIGH (ref 4.0–10.5)
nRBC: 0 % (ref 0.0–0.2)

## 2019-03-26 MED ORDER — TICAGRELOR 90 MG PO TABS: 90.0000 mg | ORAL_TABLET | Freq: Two times a day (BID) | ORAL | 0 refills | Status: DC

## 2019-03-26 MED ORDER — ATORVASTATIN CALCIUM 80 MG PO TABS: 80.0000 mg | ORAL_TABLET | Freq: Every day | ORAL | 0 refills | Status: DC

## 2019-03-26 MED ORDER — SACUBITRIL-VALSARTAN 49-51 MG PO TABS: 1.0000 | ORAL_TABLET | Freq: Two times a day (BID) | ORAL | 0 refills | Status: DC

## 2019-03-26 MED ORDER — METOPROLOL SUCCINATE ER 50 MG PO TB24: 50.0000 mg | ORAL_TABLET | Freq: Every day | ORAL | 0 refills | Status: DC

## 2019-03-26 MED ORDER — NITROGLYCERIN 0.4 MG SL SUBL: 0.4000 mg | SUBLINGUAL_TABLET | SUBLINGUAL | 0 refills | Status: DC | PRN

## 2019-03-26 MED FILL — NITROGLYCERIN 0.4 MG TAB SL: 0.4 | 7 days supply | Qty: 25 | Fill #0

## 2019-03-26 MED FILL — ENTRESTO 49 MG-51 MG TABLET: 49-51 | 30 days supply | Qty: 60 | Fill #0

## 2019-03-26 MED FILL — ATORVASTATIN CALCIUM 80 MG: 80 | 30 days supply | Qty: 30 | Fill #0

## 2019-03-26 MED FILL — BRILINTA 90 MG TABLET: 90 | 30 days supply | Qty: 60 | Fill #0

## 2019-03-26 MED FILL — METOPROLOL SUCCINATE ER 50: 50 | 30 days supply | Qty: 30 | Fill #0

## 2019-03-26 NOTE — Plan of Care (Signed)
  Problem: Education: Goal: Knowledge of General Education information will improve Description: Including pain rating scale, medication(s)/side effects and non-pharmacologic comfort measures 03/26/2019 1041 by Theora Gianotti, RN Outcome: Adequate for Discharge 03/26/2019 0822 by Theora Gianotti, RN Outcome: Progressing   Problem: Health Behavior/Discharge Planning: Goal: Ability to manage health-related needs will improve 03/26/2019 1041 by Theora Gianotti, RN Outcome: Adequate for Discharge 03/26/2019 0822 by Theora Gianotti, RN Outcome: Progressing   Problem: Clinical Measurements: Goal: Ability to maintain clinical measurements within normal limits will improve 03/26/2019 1041 by Theora Gianotti, RN Outcome: Adequate for Discharge 03/26/2019 [id number] by Theora Gianotti, RN Outcome: Progressing Goal: Will remain free from infection 03/26/2019 1041 by Theora Gianotti, RN Outcome: Adequate for Discharge 03/26/2019 0822 by Theora Gianotti, RN Outcome: Progressing Goal: Diagnostic test results will improve 03/26/2019 1041 by Theora Gianotti, RN Outcome: Adequate for Discharge 03/26/2019 [id number] by Theora Gianotti, RN Outcome: Progressing   Problem: Nutrition: Goal: Adequate nutrition will be maintained 03/26/2019 1041 by Theora Gianotti, RN Outcome: Adequate for Discharge 03/26/2019 0822 by Theora Gianotti, RN Outcome: Progressing   Problem: Education: Goal: Understanding of CV disease, CV risk reduction, and recovery process will improve 03/26/2019 1041 by Theora Gianotti, RN Outcome: Adequate for Discharge 03/26/2019 [id number] by Theora Gianotti, RN Outcome: Progressing Goal: Individualized Educational Video(s) 03/26/2019 1041 by Theora Gianotti, RN Outcome: Adequate for Discharge 03/26/2019 0822 by Theora Gianotti, RN Outcome: Progressing   Problem: Cardiovascular: Goal: Ability to achieve and maintain adequate cardiovascular  perfusion will improve 03/26/2019 1041 by Theora Gianotti, RN Outcome: Adequate for Discharge 03/26/2019 0822 by Theora Gianotti, RN Outcome: Progressing   Problem: Health Behavior/Discharge Planning: Goal: Ability to safely manage health-related needs after discharge will improve 03/26/2019 1041 by Theora Gianotti, RN Outcome: Adequate for Discharge 03/26/2019 [id number] by Theora Gianotti, RN Outcome: Progressing

## 2019-03-26 NOTE — Discharge Summary (Addendum)
Discharge Summary    Patient ID: [name],  MRN: [id number], DOB/AGE: Nov 19, 1955 63 y.o.  Admit date: 03/23/2019 Discharge date: 03/26/2019  Primary Care Provider: [name] Primary Cardiologist: Sanda Klein, MD   Discharge Diagnoses    Principal Problem:   STEMI (ST elevation myocardial infarction) [facility]) Active Problems:   Hypertension   Borderline diabetic   CAD S/P CABG X 3 (LIMA to LAD, SVG to OM, SVG to RCA) -09/01/12   Dyslipidemia   Status post CVA   Altered mental status   Acute ST elevation myocardial infarction (STEMI) involving right coronary artery ([place])   Acute respiratory failure with hypoxia (HCC)   Allergies No Known Allergies  Diagnostic Studies/Procedures    CARDIAC CATH: 03/23/2019  Prox RCA lesion is 100% stenosed.  Prox Cx lesion is 100% stenosed.  Mid LAD lesion is 100% stenosed.  Origin to Prox Graft lesion is 100% stenosed.  Dist RCA lesion is 100% stenosed.  Post intervention, there is a 0% residual stenosis.  A stent was successfully placed.  Post intervention, there is a 0% residual stenosis.  Diagnostic Dominance: Right  Intervention    ECHO: 03/24/2019  1. The left ventricle has moderately reduced systolic function, with an ejection fraction of 35-40%. The cavity size was normal. There is mildly increased left ventricular wall thickness. Left ventricular diastolic function could not be evaluated. No  evidence of left ventricular regional wall motion abnormalities.  2. Severe hypokinesis of the left ventricular, entire inferoseptal wall, inferior wall and inferolateral wall.  3. The right ventricle has moderately reduced systolic function. The cavity was mildly enlarged. There is no increase in right ventricular wall thickness.  4. Left atrial size was moderately dilated.  5. The mitral valve is abnormal. Mild thickening of the mitral valve leaflet.  6. The tricuspid valve is grossly normal.  7. The aortic valve  is tricuspid. Mild sclerosis of the aortic valve. Aortic valve regurgitation is trivial by color flow Doppler. No stenosis of the aortic valve.  8. The aorta is abnormal unless otherwise noted.  9. There is mild to moderate dilatation of the ascending aorta measuring 42 mm. _____________   History of Present Illness     63 y.o. male with a hx of CAD (PCI to [place] 08/2012, CABG 09/2012: LIMA-LAD, SVG-RCA, SVG-OM), HTN, HLD, CVA and borderline diabetes  was admitted 03/23/2019 for a STEMI.   [name] had been out working in his yard when he developed chest pain.  Symptoms worsen despite aspirin, EMS was called.  He became progressively more combative, would not follow commands or answer questions.  He was intubated in the ER.  His ECG was consistent with an inferior STEMI and he was taken emergently to the Cath Lab.  Of note, he has not seen a doctor in over a year and was not on any prescriptions.  Hospital Course     Consultants: CCM  Cath report as above, he had a thrombotic occlusion of his SVG-OM with successful PCI.  He had some ventricular arrhythmias during the case that improved with revascularization.  He was on IV amiodarone at first, but this was discontinued.  CCM was contacted to manage his vent.  He was felt to have acute respiratory failure due to his MI, hypertensive emergency and associated encephalopathy. He had mild respiratory acidosis post procedure.  He was kept on the vent overnight, but was able to be extubated on 03/24/2019.  He was restarted on blood pressure  medications and a beta-blocker, and his blood pressure gradually improved.  He was started on high-dose statin.  Smoking cessation was encouraged.  His EF was 35-40%.  He was started on Entresto.  Prior authorization is needed for coverage of Entresto, we are working to obtain this.  Case manager is involved.  He was seen by cardiac rehab, and educated on nitroglycerin use, MI restrictions, walking for exercise and  heart healthy lifestyle.  He was referred for cardiac rehab phase 2.  It will be virtual at first, at no cost to the patient.  On 9/11, he was seen by Dr. Marlou Porch and all data were reviewed.  He was ambulating without chest pain or shortness of breath.  He was having no more ventricular ectopy on telemetry.  He was noted to have a dilated aortic root on his echocardiogram at 42 mm.  He will need a follow-up echocardiogram in a year.  _____________  Discharge Vitals Blood pressure (!) 112/92, pulse 76, temperature 99.1 F (37.3 C), temperature source Oral, resp. rate (!) 21, height 5\' 8"  (1.727 m), weight 92.7 kg, SpO2 96 %.  Filed Weights   03/23/19 2047 03/26/19 0400  Weight: 90.7 kg 92.7 kg    Labs & Radiologic Studies    CBC Recent Labs    03/25/19 0805 03/26/19 0342  WBC 14.6* 13.6*  HGB 14.5 14.8  HCT 43.6 44.9  MCV 94.2 93.7  PLT 262 [id number]   Basic Metabolic Panel Recent Labs    03/24/19 0035 03/25/19 0805 03/26/19 0342  NA 138 139 139  K 3.7 3.8 3.6  CL 106 108 105  CO2 22 22 26   GLUCOSE 161* 105* 105*  BUN 17 15 13   CREATININE 1.29* 1.50* 1.37*  CALCIUM 8.2* 8.4* 8.1*  MG 2.0  --   --    Liver Function Tests Recent Labs    03/23/19 2045  AST 31  ALT 24  ALKPHOS 64  BILITOT 0.5  PROT 7.1  ALBUMIN 3.7    Cardiac Enzymes High Sensitivity Troponin:   Recent Labs  Lab 03/23/19 2045 03/24/19 0026  TROPONINIHS 21* >27,000*     Hemoglobin A1C Lab Results  Component Value Date   HGBA1C 5.6 03/25/2019    Fasting Lipid Panel Cholesterol  Date Value Ref Range Status  03/25/2019 164 0 - 200 mg/dL Final   HDL  Date Value Ref Range Status  03/25/2019 31 (L) >40 mg/dL Final   LDL Cholesterol  Date Value Ref Range Status  03/25/2019 93 0 - 99 mg/dL Final    Comment:           Total Cholesterol/HDL:CHD Risk Coronary Heart Disease Risk Table                     Men   Women  1/2 Average Risk   3.4   3.3  Average Risk       5.0   4.4  2 X  Average Risk   9.6   7.1  3 X Average Risk  23.4   11.0        Use the calculated Patient Ratio above and the CHD Risk Table to determine the patient's CHD Risk.        ATP III CLASSIFICATION (LDL):  <100     mg/dL   Optimal  100-129  mg/dL   Near or Above  Optimal  130-159  mg/dL   Borderline  160-189  mg/dL   High  >190     mg/dL   Very High Performed at [facility] [address], [place], [place] [address]    Triglycerides  Date Value Ref Range Status  03/25/2019 198 (H) <150 mg/dL Final   Direct LDL  Date Value Ref Range Status  03/23/2019 94.8 0 - 99 mg/dL Final    Comment:    Performed at [facility] [address], [place], [place] [address]   Thyroid Function Tests TSH  Date Value Ref Range Status  03/23/2019 3.395 0.350 - 4.500 uIU/mL Final    Comment:    Performed by a 3rd Generation assay with a functional sensitivity of <=0.01 uIU/mL. Performed at [facility], [place] [address] [place], [place] [address]   01/07/2011 1.120 0.350 - 4.500 uIU/mL Final   _____________  Ct Head Wo Contrast  Result Date: 03/24/2019 CLINICAL DATA:  Inpatient. Encephalopathy. History of hypertension, CVA and STEMI. No reported injury. EXAM: CT HEAD WITHOUT CONTRAST TECHNIQUE: Contiguous axial images were obtained from the base of the skull through the vertex without intravenous contrast. COMPARISON:  12/30/2012 head CT. FINDINGS: Brain: Tiny right basal ganglia lacune, new since prior CT. No evidence of parenchymal hemorrhage or extra-axial fluid collection. No mass lesion, mass effect, or midline shift. No CT evidence of acute transcortical infarction. Nonspecific mild subcortical and periventricular white matter hypodensity, most in keeping with chronic small vessel ischemic change. Generalized cerebral volume loss. Cerebral ventricle sizes are stable and concordant with the degree of cerebral volume loss. Vascular: No acute abnormality. Skull: No  evidence of calvarial fracture. Sinuses/Orbits: The visualized paranasal sinuses are essentially clear. Other: Oral route tubes enter the hypopharynx with the tips not seen on this study. The mastoid air cells are unopacified. IMPRESSION: 1. Tiny right basal ganglia lacune of uncertain chronicity, new from 2014 and CT. 2. Otherwise no evidence of acute intracranial abnormality. 3. Generalized cerebral volume loss and mild chronic small vessel ischemic changes in the cerebral white matter. Electronically Signed   By: Ilona Sorrel M.D.   On: 03/24/2019 08:35   Dg Chest Portable 1 View  Result Date: 03/23/2019 CLINICAL DATA:  Post intubation EXAM: PORTABLE CHEST 1 VIEW COMPARISON:  03/23/2019, 09/28/2012 FINDINGS: Post sternotomy changes. Placement of endotracheal tube, the tip is about 3.4 cm superior to the carina. Esophageal tube tip is below the diaphragm but non included. Enlarged cardiomediastinal silhouette. Low lung volume. No acute airspace disease. IMPRESSION: 1. Endotracheal tube tip about 3.4 cm superior to carina. Esophageal tube tip below the diaphragm but non included 2. Low lung volumes.  Mild cardiomegaly. Electronically Signed   By: Donavan Foil M.D.   On: 03/23/2019 20:56   Dg Chest Portable 1 View  Result Date: 03/23/2019 CLINICAL DATA:  Chest pain for 1 hour. EXAM: PORTABLE CHEST 1 VIEW COMPARISON:  09/28/2012 FINDINGS: Previous median sternotomy and CABG procedure. Heart size appears enlarged. No pleural effusion or edema. No airspace opacities. IMPRESSION: 1. No active disease. 2. Cardiac enlargement. Electronically Signed   By: Kerby Moors M.D.   On: 03/23/2019 20:42   Disposition   Pt is being discharged home today in good condition.  Follow-up Plans & Appointments    Follow-up Information    Almyra Deforest, [place] Follow up on 03/31/2019.   Specialties: Cardiology, Radiology Why: VIRTUAL office visit at 11:00 am. The office will contact you about this. Contact  information: [address] [place] [address] [phone]          Discharge Instructions    Amb Referral to Cardiac Rehabilitation   Complete by: As directed    Diagnosis:  STEMI Coronary Stents     After initial evaluation and assessments completed: Virtual Based Care may be provided alone or in conjunction with Phase 2 Cardiac Rehab based on patient barriers.: Yes   Diet - low sodium heart healthy   Complete by: As directed    Increase activity slowly   Complete by: As directed       Discharge Medications   Allergies as of 03/26/2019   No Known Allergies     Medication List    TAKE these medications   aspirin 81 MG chewable tablet Chew 81 mg by mouth daily.   atorvastatin 80 MG tablet Commonly known as: LIPITOR Take 1 tablet (80 mg total) by mouth daily at 6 PM.   metoprolol succinate 50 MG 24 hr tablet Commonly known as: TOPROL-XL Take 1 tablet (50 mg total) by mouth daily. Take with or immediately following a meal.   nitroGLYCERIN 0.4 MG SL tablet Commonly known as: NITROSTAT Place 1 tablet (0.4 mg total) under the tongue every 5 (five) minutes x 3 doses as needed for chest pain.   sacubitril-valsartan 49-51 MG Commonly known as: ENTRESTO Take 1 tablet by mouth 2 (two) times daily.   ticagrelor 90 MG Tabs tablet Commonly known as: BRILINTA Take 1 tablet (90 mg total) by mouth 2 (two) times daily.        Aspirin prescribed at discharge?  Yes High Intensity Statin Prescribed? (Lipitor 40-80mg  or Crestor 20-40mg ): Yes Beta Blocker Prescribed? Yes For EF <40%, was ACEI/ARB Prescribed? Yes ADP Receptor Inhibitor Prescribed? (i.e. Plavix etc.-Includes Medically Managed Patients): Yes For EF <40%, Aldosterone Inhibitor Prescribed? No: not yet Was EF assessed during THIS hospitalization? Yes Was Cardiac Rehab II ordered? (Included Medically managed Patients): Yes   Outstanding Labs/Studies   None  Duration of Discharge Encounter   Greater than 30  minutes including physician time.  Alcario Drought Barrett NP 03/26/2019, 9:42 AM   Personally seen and examined. Agree with above.   Primary Cardiologist: Sanda Klein, MD   Subjective   Doing quite well, smiling in his chair.  Ready to go home he states.  No chest pain, no shortness of breath.  Inpatient Medications    Scheduled Meds:  aspirin  81 mg Oral Daily   atorvastatin  80 mg Oral q1800   Chlorhexidine Gluconate Cloth  6 each Topical Daily   fentaNYL (SUBLIMAZE) injection  50 mcg Intravenous Once   influenza vac split quadrivalent PF  0.5 mL Intramuscular Tomorrow-1000   insulin aspart  0-15 Units Subcutaneous Q4H   mouth rinse  15 mL Mouth Rinse BID   metoprolol succinate  50 mg Oral Daily   midazolam  3 mg Intravenous Once   sacubitril-valsartan  1 tablet Oral BID   sodium chloride flush  3 mL Intravenous Q12H   ticagrelor  90 mg Oral BID   Continuous Infusions:  sodium chloride     PRN Meds: sodium chloride, acetaminophen, nitroGLYCERIN, ondansetron (ZOFRAN) IV, promethazine, sodium chloride flush   Vital Signs          Vitals:   03/26/19 0600 03/26/19 0700 03/26/19 0800 03/26/19 0808  BP: (!) 141/98     Pulse: 65  76   Resp: 13 (!) 25 (!) 21   Temp:   (  P) 99.1 F (37.3 C) 99.1 F (37.3 C)  TempSrc:   Oral Oral  SpO2: 95%     Weight:      Height:        Intake/Output Summary (Last 24 hours) at 03/26/2019 0817 Last data filed at 03/26/2019 0749    Gross per 24 hour  Intake 856.67 ml  Output 0 ml  Net 856.67 ml   Last 3 Weights 03/26/2019 03/23/2019 02/03/2013  Weight (lbs) 204 lb 5.9 oz 200 lb 204 lb 11.2 oz  Weight (kg) 92.7 kg 90.719 kg 92.851 kg      Telemetry    No atrial fibrillation, no ventricular tachycardia, sinus rhythm- Personally Reviewed  ECG    Sinus rhythm with persistent ST elevation noted in the inferior leads- Personally Reviewed  Physical Exam   GEN: Well nourished, well  developed, in no acute distress  HEENT: normal  Neck: no JVD, carotid bruits, or masses Cardiac: RRR; no murmurs, rubs, or gallops,no edema  Respiratory:  clear to auscultation bilaterally, normal work of breathing GI: soft, nontender, nondistended, + BS MS: no deformity or atrophy, minimal tenderness to cath site with normal distal pulses Skin: warm and dry, no rash Neuro:  Alert and Oriented x 3, Strength and sensation are intact Psych: euthymic mood, full affect   Labs    High Sensitivity Troponin:   Last Labs       Recent Labs  Lab 03/23/19 2045 03/24/19 0026  TROPONINIHS 21* >27,000*        Chemistry Last Labs          Recent Labs  Lab 03/23/19 2045  03/24/19 0035 03/25/19 0805 03/26/19 0342  NA 137   < > 138 139 139  K 3.1*   < > 3.7 3.8 3.6  CL 103  --  106 108 105  CO2 19*  --  22 22 26   GLUCOSE 178*  --  161* 105* 105*  BUN 20  --  17 15 13   CREATININE 1.70*  --  1.29* 1.50* 1.37*  CALCIUM 8.7*  --  8.2* 8.4* 8.1*  PROT 7.1  --   --   --   --   ALBUMIN 3.7  --   --   --   --   AST 31  --   --   --   --   ALT 24  --   --   --   --   ALKPHOS 64  --   --   --   --   BILITOT 0.5  --   --   --   --   GFRNONAA 42*  --  58* 49* 54*  GFRAA 48*  --  >60 56* >60  ANIONGAP 15  --  10 9 8    < > = values in this interval not displayed.       Hematology Last Labs        Recent Labs  Lab 03/24/19 0035 03/25/19 0805 03/26/19 0342  WBC 21.7* 14.6* 13.6*  RBC 5.10 4.63 4.79  HGB 15.9 14.5 14.8  HCT 46.4 43.6 44.9  MCV 91.0 94.2 93.7  MCH 31.2 31.3 30.9  MCHC 34.3 33.3 33.0  RDW 12.6 13.5 13.2  PLT 414* 262 266      BNP Last Labs   No results for input(s): BNP, PROBNP in the last 168 hours.     DDimer  Last Labs   No results for input(s): DDIMER in the last 168  hours.     Radiology    Imaging Results (Last 48 hours)  No results found.    Cardiac Studies   SVG to RCA/PDA stent  EF 35 to 40% on echo.  Hypokinesis  inferior. Ascending aorta 42 mm  Patient Profile     63 y.o. male with inferior/inferior lateral ST elevation myocardial infarction with SVG to PDA/RCA bypass graft stenting in its proximal segment.  Has ongoing hypertension as well as ischemic cardiomyopathy EF 35 to 40%.  Assessment & Plan    Inferior STEMI - Dual antiplatelet therapy, high intensity statin, beta-blocker, cardiac rehab.  High-sensitivity troponin greater than 27,000.  Doing very well without any anginal symptoms.  Walking well.  Ischemic cardiomyopathy -EF 35 to 40% creatinine currently 1. 4 -Appears euvolemic, LVEDP was 17 during cath. -Just started on 9/10 Entresto 49/51 given his elevated blood pressure and cardiomyopathy.  seems to have the blood pressure to be able to tolerate the moderate dose. Toprol XL 50 mg once a day.  We will recheck basic metabolic profile at follow-up in approximately 2 weeks, transition of care.  Ventricular tachycardia -No further evidence of VT. off of amiodarone now.  Revascularization.  Leukocytosis -White count has decreased from 21,000-14,000.  Reactive from MI.  No evidence of infection.  Stable  Mild hypokalemia - Resolved  Dilated aortic root -42 mm.  Consider following this as outpatient next year with echocardiogram. For questions or updates, please contact [facility] Please consult [email or web address] for contact info under   We will set up follow-up with Dr. Gwenlyn Found or APP in 2 weeks.  Check basic metabolic profile at that time.  New start Entresto.  Transition of care visit.    Signed, Candee Furbish, MD

## 2019-03-26 NOTE — Discharge Instructions (Signed)
WEIGH DAILY, every am, wearing the same amount of clothing Record weights, contact Mihai Croitoru, MD for weight gain of 3 lbs in a day or 5 lbs in a week Limit sodium to 500 mg per meal, total 2000 mg per day Limit all liquids to 1.5-2 liters/quarts per day  PLEASE REMEMBER TO BRING ALL OF YOUR MEDICATIONS TO EACH OF YOUR FOLLOW-UP OFFICE VISITS.  PLEASE ATTEND ALL SCHEDULED FOLLOW-UP APPOINTMENTS.   Activity: Increase activity slowly as tolerated. You may shower, but no soaking baths (or swimming) for 1 week. No driving for 1 week. No lifting over 5 lbs for 2 weeks. No sexual activity for 1 week.   You May Return to Work: in 3 weeks (if applicable)  Wound Care: You may wash cath site gently with soap and water. Keep cath site clean and dry. If you notice pain, swelling, bleeding or pus at your cath site, please call [phone]   Cardiac Cath Site Care Refer to this sheet in the next few weeks. These instructions provide you with information on caring for yourself after your procedure. Your caregiver may also give you more specific instructions. Your treatment has been planned according to current medical practices, but problems sometimes occur. Call your caregiver if you have any problems or questions after your procedure. HOME CARE INSTRUCTIONS  You may shower 24 hours after the procedure. Remove the bandage (dressing) and gently wash the site with plain soap and water. Gently pat the site dry.   Do not apply powder or lotion to the site.   Do not sit in a bathtub, swimming pool, or whirlpool for 5 to 7 days.   No bending, squatting, or lifting anything over 10 pounds (4.5 kg) as directed by your caregiver.   Inspect the site at least twice daily.   Do not drive home if you are discharged the same day of the procedure. Have someone else drive you.   You may drive 24 hours after the procedure unless otherwise instructed by your caregiver.  What to expect:  Any bruising will  usually fade within 1 to 2 weeks.   Blood that collects in the tissue (hematoma) may be painful to the touch. It should usually decrease in size and tenderness within 1 to 2 weeks.  SEEK IMMEDIATE MEDICAL CARE IF:  You have unusual pain at the site or down the affected limb.   You have redness, warmth, swelling, or pain at the site.   You have drainage (other than a small amount of blood on the dressing).   You have chills.   You have a fever or persistent symptoms for more than 72 hours.   You have a fever and your symptoms suddenly get worse.   Your leg becomes pale, cool, tingly, or numb.   You have heavy bleeding from the site. Hold pressure on the site.  Document Released: 08/03/2010 Document Revised: 06/20/2011 Document Reviewed:

## 2019-03-26 NOTE — Plan of Care (Signed)
  Problem: Education: Goal: Knowledge of General Education information will improve Description: Including pain rating scale, medication(s)/side effects and non-pharmacologic comfort measures Outcome: Progressing   Problem: Health Behavior/Discharge Planning: Goal: Ability to manage health-related needs will improve Outcome: Progressing   Problem: Clinical Measurements: Goal: Ability to maintain clinical measurements within normal limits will improve Outcome: Progressing Goal: Will remain free from infection Outcome: Progressing Goal: Diagnostic test results will improve Outcome: Progressing   Problem: Nutrition: Goal: Adequate nutrition will be maintained Outcome: Progressing   Problem: Education: Goal: Understanding of CV disease, CV risk reduction, and recovery process will improve Outcome: Progressing Goal: Individualized Educational Video(s) Outcome: Progressing   Problem: Cardiovascular: Goal: Ability to achieve and maintain adequate cardiovascular perfusion will improve Outcome: Progressing   Problem: Health Behavior/Discharge Planning: Goal: Ability to safely manage health-related needs after discharge will improve Outcome: Progressing   Problem: Clinical Measurements: Goal: Respiratory complications will improve Outcome: Completed/Met Goal: Cardiovascular complication will be avoided Outcome: Completed/Met   Problem: Activity: Goal: Risk for activity intolerance will decrease Outcome: Completed/Met   Problem: Coping: Goal: Level of anxiety will decrease Outcome: Completed/Met   Problem: Elimination: Goal: Will not experience complications related to bowel motility Outcome: Completed/Met Goal: Will not experience complications related to urinary retention Outcome: Completed/Met   Problem: Pain Managment: Goal: General experience of comfort will improve Outcome: Completed/Met   Problem: Safety: Goal: Ability to remain free from injury will  improve Outcome: Completed/Met   Problem: Skin Integrity: Goal: Risk for impaired skin integrity will decrease Outcome: Completed/Met   Problem: Activity: Goal: Ability to return to baseline activity level will improve Outcome: Completed/Met   Problem: Cardiovascular: Goal: Vascular access site(s) Level 0-1 will be maintained Outcome: Completed/Met   Problem: Activity: Goal: Ability to tolerate increased activity will improve Outcome: Completed/Met   Problem: Respiratory: Goal: Ability to maintain a clear airway and adequate ventilation will improve Outcome: Completed/Met   Problem: Role Relationship: Goal: Method of communication will improve Outcome: Completed/Met

## 2019-03-26 NOTE — Progress Notes (Signed)
CARDIAC REHAB PHASE I   PRE:  Rate/Rhythm: 80 SR  BP:  Supine:   Sitting: 145/90  Standing:    SaO2: 97%RA  MODE:  Ambulation: 640 ft   POST:  Rate/Rhythm: 92 SR  BP:  Supine:   Sitting: 156/103, 140/99  Standing:    SaO2: 95%RA [id number] Pt walked 640 ft on RA with steady gait and tolerated well. No CP. BP elevated before and after walk.    Graylon Good, RN BSN  03/26/2019 9:32 AM

## 2019-03-26 NOTE — Progress Notes (Signed)
Progress Note  Patient Name: [name] Date of Encounter: 03/26/2019  Primary Cardiologist: Sanda Klein, MD   Subjective   Doing quite well, smiling in his chair.  Ready to go home he states.  No chest pain, no shortness of breath.  Inpatient Medications    Scheduled Meds: . aspirin  81 mg Oral Daily  . atorvastatin  80 mg Oral q1800  . Chlorhexidine Gluconate Cloth  6 each Topical Daily  . fentaNYL (SUBLIMAZE) injection  50 mcg Intravenous Once  . influenza vac split quadrivalent PF  0.5 mL Intramuscular Tomorrow-1000  . insulin aspart  0-15 Units Subcutaneous Q4H  . mouth rinse  15 mL Mouth Rinse BID  . metoprolol succinate  50 mg Oral Daily  . midazolam  3 mg Intravenous Once  . sacubitril-valsartan  1 tablet Oral BID  . sodium chloride flush  3 mL Intravenous Q12H  . ticagrelor  90 mg Oral BID   Continuous Infusions: . sodium chloride     PRN Meds: sodium chloride, acetaminophen, nitroGLYCERIN, ondansetron (ZOFRAN) IV, promethazine, sodium chloride flush   Vital Signs    Vitals:   03/26/19 0600 03/26/19 0700 03/26/19 0800 03/26/19 0808  BP: (!) 141/98     Pulse: 65  76   Resp: 13 (!) 25 (!) 21   Temp:   (P) 99.1 F (37.3 C) 99.1 F (37.3 C)  TempSrc:   Oral Oral  SpO2: 95%     Weight:      Height:        Intake/Output Summary (Last 24 hours) at 03/26/2019 0817 Last data filed at 03/26/2019 0749 Gross per 24 hour  Intake 856.67 ml  Output 0 ml  Net 856.67 ml   Last 3 Weights 03/26/2019 03/23/2019 02/03/2013  Weight (lbs) 204 lb 5.9 oz 200 lb 204 lb 11.2 oz  Weight (kg) 92.7 kg 90.719 kg 92.851 kg      Telemetry    No atrial fibrillation, no ventricular tachycardia, sinus rhythm- Personally Reviewed  ECG    Sinus rhythm with persistent ST elevation noted in the inferior leads- Personally Reviewed  Physical Exam   GEN: Well nourished, well developed, in no acute distress  HEENT: normal  Neck: no JVD, carotid bruits, or masses Cardiac:  RRR; no murmurs, rubs, or gallops,no edema  Respiratory:  clear to auscultation bilaterally, normal work of breathing GI: soft, nontender, nondistended, + BS MS: no deformity or atrophy, minimal tenderness to cath site with normal distal pulses Skin: warm and dry, no rash Neuro:  Alert and Oriented x 3, Strength and sensation are intact Psych: euthymic mood, full affect   Labs    High Sensitivity Troponin:   Recent Labs  Lab 03/23/19 2045 03/24/19 0026  TROPONINIHS 21* >27,000*      Chemistry Recent Labs  Lab 03/23/19 2045  03/24/19 0035 03/25/19 0805 03/26/19 0342  NA 137   < > 138 139 139  K 3.1*   < > 3.7 3.8 3.6  CL 103  --  106 108 105  CO2 19*  --  22 22 26   GLUCOSE 178*  --  161* 105* 105*  BUN 20  --  17 15 13   CREATININE 1.70*  --  1.29* 1.50* 1.37*  CALCIUM 8.7*  --  8.2* 8.4* 8.1*  PROT 7.1  --   --   --   --   ALBUMIN 3.7  --   --   --   --   AST 31  --   --   --   --  ALT 24  --   --   --   --   ALKPHOS 64  --   --   --   --   BILITOT 0.5  --   --   --   --   GFRNONAA 42*  --  58* 49* 54*  GFRAA 48*  --  >60 56* >60  ANIONGAP 15  --  10 9 8    < > = values in this interval not displayed.     Hematology Recent Labs  Lab 03/24/19 0035 03/25/19 0805 03/26/19 0342  WBC 21.7* 14.6* 13.6*  RBC 5.10 4.63 4.79  HGB 15.9 14.5 14.8  HCT 46.4 43.6 44.9  MCV 91.0 94.2 93.7  MCH 31.2 31.3 30.9  MCHC 34.3 33.3 33.0  RDW 12.6 13.5 13.2  PLT 414* 262 266    BNPNo results for input(s): BNP, PROBNP in the last 168 hours.   DDimer No results for input(s): DDIMER in the last 168 hours.   Radiology    No results found.  Cardiac Studies   SVG to RCA/PDA stent  EF 35 to 40% on echo.  Hypokinesis inferior. Ascending aorta 42 mm  Patient Profile     63 y.o. male with inferior/inferior lateral ST elevation myocardial infarction with SVG to PDA/RCA bypass graft stenting in its proximal segment.  Has ongoing hypertension as well as ischemic  cardiomyopathy EF 35 to 40%.  Assessment & Plan    Inferior STEMI - Dual antiplatelet therapy, high intensity statin, beta-blocker, cardiac rehab.  High-sensitivity troponin greater than 27,000.  Doing very well without any anginal symptoms.  Walking well.  Ischemic cardiomyopathy -EF 35 to 40% creatinine currently 1. 4 -Appears euvolemic, LVEDP was 17 during cath. -Just started on 9/10 Entresto 49/51 given his elevated blood pressure and cardiomyopathy.  seems to have the blood pressure to be able to tolerate the moderate dose. Toprol XL 50 mg once a day.  We will recheck basic metabolic profile at follow-up in approximately 2 weeks, transition of care.  Ventricular tachycardia -No further evidence of VT. off of amiodarone now.  Revascularization.  Leukocytosis -White count has decreased from 21,000-14,000.  Reactive from MI.  No evidence of infection.  Stable  Mild hypokalemia - Resolved  Dilated aortic root -42 mm.  Consider following this as outpatient next year with echocardiogram. For questions or updates, please contact [place] Please consult [email or web address] for contact info under   We will set up follow-up with Dr. Gwenlyn Found or APP in 2 weeks.  Check basic metabolic profile at that time.  New start Entresto.  Transition of care visit.    Signed, Candee Furbish, MD  03/26/2019, 8:17 AM

## 2019-03-26 NOTE — Care Management (Signed)
03-26-19 1123 CM did provide patient with discount cards for Brilinta and Entresto. Rx's filled via TOC and patient has medications at the bedside. Patient has transportation home. No further needs from CM at this time. Bethena Roys, RN,BSN Case Manager [phone]

## 2019-03-30 ENCOUNTER — Telehealth (HOSPITAL_COMMUNITY): Payer: Self-pay

## 2019-03-30 NOTE — Telephone Encounter (Signed)
Attempted to contact pt in regards to CR, unable to leave VM.

## 2019-03-30 NOTE — Telephone Encounter (Signed)
Pt insurance is active and benefits verified through [employer]. Co-pay $0.00, DED $1,500.00/$1,500.00 met, out of pocket $5,900.00/$2,504.40 met, co-insurance 0%. No pre-authorization required. Passport, 03/30/2019 @ 135PM, YLT#[id number]  Will contact patient to see if he is interested in the Cardiac Rehab Program. If interested, patient will need to complete follow up appt. Once completed, patient will be contacted for scheduling upon review by the RN Navigator.

## 2019-03-31 ENCOUNTER — Encounter: Payer: Self-pay | Admitting: Physician Assistant

## 2019-03-31 ENCOUNTER — Telehealth: Payer: Self-pay

## 2019-03-31 ENCOUNTER — Telehealth (INDEPENDENT_AMBULATORY_CARE_PROVIDER_SITE_OTHER): Payer: BC Managed Care – PPO | Admitting: Physician Assistant

## 2019-03-31 ENCOUNTER — Telehealth: Payer: Self-pay | Admitting: *Deleted

## 2019-03-31 VITALS — Ht 67.0 in | Wt 200.0 lb

## 2019-03-31 DIAGNOSIS — I10 Essential (primary) hypertension: Secondary | ICD-10-CM | POA: Diagnosis not present

## 2019-03-31 DIAGNOSIS — E785 Hyperlipidemia, unspecified: Secondary | ICD-10-CM

## 2019-03-31 DIAGNOSIS — I712 Thoracic aortic aneurysm, without rupture, unspecified: Secondary | ICD-10-CM

## 2019-03-31 DIAGNOSIS — I255 Ischemic cardiomyopathy: Secondary | ICD-10-CM

## 2019-03-31 DIAGNOSIS — I25810 Atherosclerosis of coronary artery bypass graft(s) without angina pectoris: Secondary | ICD-10-CM

## 2019-03-31 DIAGNOSIS — D72829 Elevated white blood cell count, unspecified: Secondary | ICD-10-CM

## 2019-03-31 DIAGNOSIS — E876 Hypokalemia: Secondary | ICD-10-CM

## 2019-03-31 NOTE — Telephone Encounter (Signed)
Prior Authorization for [name] has been started: Key Code [id number]

## 2019-03-31 NOTE — Progress Notes (Signed)
Virtual Visit via Telephone Note   This visit type was conducted due to national recommendations for restrictions regarding the COVID-19 Pandemic (e.g. social distancing) in an effort to limit this patient's exposure and mitigate transmission in our community.  Due to his co-morbid illnesses, this patient is at least at moderate risk for complications without adequate follow up.  This format is felt to be most appropriate for this patient at this time.  The patient did not have access to video technology/had technical difficulties with video requiring transitioning to audio format only (telephone).  All issues noted in this document were discussed and addressed.  No physical exam could be performed with this format.  Please refer to the patient's chart for his  consent to telehealth for [facility].   Date:  03/31/2019   ID:  [name], DOB 02/17/1956, MRN [id number]  Patient Location: Home Provider Location: Home  PCP:  [name], MD  Cardiologist:  Sanda Klein, MD  Electrophysiologist:  None   Evaluation Performed:  Follow-Up Visit  Chief Complaint:  Hospital followup  History of Present Illness:    [name] is a 63 y.o. male with PMH of CAD (PCI to pLCx 08/2012, CABG 09/2012), HTN, HLD, and CVA who was recently admitted on 03/23/2019 with STEMI.  He previously underwent CABG in March 2014 with LIMA to LAD, SVG to RCA and SVG to OM.  Majority of the history was obtained from his wife as patient was combative in the ER and uncooperative.  He was intubated in the ED prior to the Cath Lab.  Urgent cardiac catheterization performed on 03/23/2019 showed 100% proximal RCA, 100% proximal left circumflex, 100% mid LAD, 100% occluded SVG to RCA treated with DES.  In the Cath Lab, he was treated with IV amiodarone and IV metoprolol due to nonsustained VT.  Total balloon time was 31-minute.  Post-cath, he had fever with T-max 100.4.  White blood cell count of 21,000.  COVID test was  negative.  He was extubated on the following day.  Echocardiogram showed ischemic cardiomyopathy with EF 35 to 40%, dilated aortic root at the 42 mm.  He was started on Entresto and Toprol-XL.  Patient was contacted today via telephone visit.  He does not have any vitals with him.  He says he has been compliant with his medication and has not had any further chest discomfort.  We emphasized on the importance of compliance with dual antiplatelet therapy.  I also discussed with him his hyperlipidemia, hypokalemia and thoracic aortic aneurysm.  It is imperative for him to control his blood pressure.  I plan to bring him back in 2-1/2 weeks to check his blood pressure, EKG and figure out whether or not to uptitrate his Entresto.  During the meantime, he will need to contact his pharmacy to figure out the cost of [place].  He denies any lower extremity edema, orthopnea or PND.  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  The patient does not have symptoms concerning for COVID-19 infection (fever, chills, cough, or new shortness of breath).    Past Medical History:  Diagnosis Date  . Borderline diabetic   . Coronary artery disease   . Dyslipidemia 01/11/2013  . Hiatal hernia   . Hypertension   . Mini stroke ([place])    2  . S/P CABG x 3   . STEMI (ST elevation myocardial infarction) ([place])   .  Stroke [facility])    Past Surgical History:  Procedure Laterality Date  . CARDIAC CATHETERIZATION    . CORONARY ARTERY BYPASS GRAFT N/A 09/01/2012   Procedure: CORONARY ARTERY BYPASS GRAFTING (CABG);  Surgeon: Gaye Pollack, MD;  Location: [facility];  Service: Open Heart Surgery;  Laterality: N/A;  Coronary Artery Bypass Grafting Times Three Using Left Internal Mammary Artery and Left Saphenous leg Vein Harvested Endoscopically  . CORONARY STENT INTERVENTION N/A 03/23/2019   Procedure: CORONARY STENT INTERVENTION;  Surgeon: Lorretta Harp, MD;  Location: [place] CV LAB;  Service: Cardiovascular;  Laterality: N/A;  . CORONARY/GRAFT ACUTE MI REVASCULARIZATION N/A 03/23/2019   Procedure: Coronary/Graft Acute MI Revascularization;  Surgeon: Lorretta Harp, MD;  Location: [place] CV LAB;  Service: Cardiovascular;  Laterality: N/A;  . INTRAOPERATIVE TRANSESOPHAGEAL ECHOCARDIOGRAM N/A 09/01/2012   Procedure: INTRAOPERATIVE TRANSESOPHAGEAL ECHOCARDIOGRAM;  Surgeon: Gaye Pollack, MD;  Location: [facility] OR;  Service: Open Heart Surgery;  Laterality: N/A;  . LEFT HEART CATH AND CORONARY ANGIOGRAPHY N/A 03/23/2019   Procedure: LEFT HEART CATH AND CORONARY ANGIOGRAPHY;  Surgeon: Lorretta Harp, MD;  Location: [facility] CV LAB;  Service: Cardiovascular;  Laterality: N/A;  . LEFT HEART CATHETERIZATION WITH CORONARY ANGIOGRAM N/A 08/31/2012   Procedure: LEFT HEART CATHETERIZATION WITH CORONARY ANGIOGRAM;  Surgeon: Peter M Martinique, MD;  [facility] C[facility] LAB;  Service: Cardiovascular;  Laterality: N/A;  . PERCUTANEOUS CORONARY STENT INTERVENTION (PCI-S) N/A 08/31/2012   Procedure: PERCUTANEOUS CORONARY STENT INTERVENTION (PCI-S);  Surgeon: Peter M Martinique, MD;  Location: [facility] CATH LAB;  Service: Cardiovascular;  Laterality: N/A;     Current Meds  Medication Sig  . aspirin 81 MG chewable tablet Chew 81 mg by mouth daily.  Marland Kitchen atorvastatin (LIPITOR) 80 MG tablet Take 1 tablet (80 mg total) by mouth daily at 6 PM.  . metoprolol succinate (TOPROL-XL) 50 MG 24 hr tablet Take 1 tablet (50 mg total) by mouth daily. Take with or immediately following a meal.  . nitroGLYCERIN (NITROSTAT) 0.4 MG SL tablet Place 1 tablet (0.4 mg total) under the tongue every 5 (five) minutes x 3 doses as needed for chest pain.  . sacubitril-valsartan (ENTRESTO) 49-51 MG Take 1 tablet by mouth 2 (two) times daily.  . ticagrelor (BRILINTA) 90 MG TABS tablet Take 1 tablet (90 mg total) by mouth 2 (two) times daily.     Allergies:   Patient has no known allergies.   Social History   Tobacco  Use  . Smoking status: Former Smoker    Types: Pipe  . Smokeless tobacco: Former Systems developer    Quit date: 07/15/1968  Substance Use Topics  . Alcohol use: No  . Drug use: No     Family Hx: The patient's family history includes Cancer in his father; Heart attack in his brother, father, and sister; Hyperlipidemia in his brother and father; Hypertension in his brother, father, and sister.  ROS:   Please see the history of present illness.     All other systems reviewed and are negative.   Prior CV studies:   The following studies were reviewed today:  Cath 03/23/2019  Prox RCA lesion is 100% stenosed.  Prox Cx lesion is 100% stenosed.  Mid LAD lesion is 100% stenosed.  Origin to Prox Graft lesion is 100% stenosed.  Dist RCA lesion is 100% stenosed.  Post intervention, there is a 0% residual stenosis.  A stent was successfully placed.  Post intervention, there is a 0% residual stenosis.  Echo 03/24/2019 1. The left ventricle has moderately reduced systolic function, with an ejection fraction of 35-40%. The cavity size was normal. There is mildly increased left ventricular wall thickness. Left ventricular diastolic function could not be evaluated. No  evidence of left ventricular regional wall motion abnormalities.  2. Severe hypokinesis of the left ventricular, entire inferoseptal wall, inferior wall and inferolateral wall.  3. The right ventricle has moderately reduced systolic function. The cavity was mildly enlarged. There is no increase in right ventricular wall thickness.  4. Left atrial size was moderately dilated.  5. The mitral valve is abnormal. Mild thickening of the mitral valve leaflet.  6. The tricuspid valve is grossly normal.  7. The aortic valve is tricuspid. Mild sclerosis of the aortic valve. Aortic valve regurgitation is trivial by color flow Doppler. No stenosis of the aortic valve.  8. The aorta is abnormal unless otherwise noted.  9. There is mild to  moderate dilatation of the ascending aorta measuring 42 mm.  Labs/Other Tests and Data Reviewed:    EKG:  An ECG dated 03/25/2019 was personally reviewed today and demonstrated:  Normal sinus rhythm with ST elevation in inferior leads.  Recent Labs: 03/23/2019: ALT 24; TSH 3.395 03/24/2019: Magnesium 2.0 03/26/2019: BUN 13; Creatinine, Ser 1.37; Hemoglobin 14.8; Platelets 266; Potassium 3.6; Sodium 139   Recent Lipid Panel Lab Results  Component Value Date/Time   CHOL 164 03/25/2019 02:39 AM   TRIG 198 (H) 03/25/2019 02:39 AM   HDL 31 (L) 03/25/2019 02:39 AM   CHOLHDL 5.3 03/25/2019 02:39 AM   LDLCALC 93 03/25/2019 02:39 AM   LDLDIRECT 94.8 03/23/2019 08:46 PM    Wt Readings from Last 3 Encounters:  03/31/19 200 lb (90.7 kg)  03/26/19 204 lb 5.9 oz (92.7 kg)  02/03/13 204 lb 11.2 oz (92.9 kg)     Objective:    Vital Signs:  Ht 5\' 7"  (1.702 m)   Wt 200 lb (90.7 kg)   BMI 31.32 kg/m     ASSESSMENT & PLAN:    1. CAD s/p CABG: Continue aspirin and Brilinta.  Denies any further chest pain.  2. Ischemic cardiomyopathy: EF 35%.  On Toprol-XL and Entresto.  Once finished up titrating heart failure medication, will repeat echocardiogram in 36-month  3. Hypertension: Continue metoprolol and Entresto.  Will consider uptitrate Entresto and follow-up  4. Hyperlipidemia: Continue Lipitor 80 mg daily.  Fasting lipid panel and LFT in 6 to 8 weeks.  5. TAA: Dilated thoracic aorta measuring at 42 mm  6. Leukocytosis: Repeat CBC  7. Hypokalemia: Obtain basic metabolic panel   [id number] Education: The signs and symptoms of COVID-19 were discussed with the patient and how to seek care for testing (follow up with PCP or arrange E-visit).  The importance of social distancing was discussed today.  Time:   Today, I have spent 18 minutes with the patient with telehealth technology discussing the above problems.     Medication Adjustments/Labs and Tests Ordered: Current medicines are  reviewed at length with the patient today.  Concerns regarding medicines are outlined above.   Tests Ordered: Orders Placed This Encounter  Procedures  . Basic metabolic panel  . CBC    Medication Changes: No orders of the defined types were placed in this encounter.   Follow Up:  In Person in 2 week(s)  Signed, Almyra Deforest, [place]  03/31/2019 11:36 PM    [facility]

## 2019-03-31 NOTE — Patient Instructions (Addendum)
Medication Instructions:  Your physician recommends that you continue on your current medications as directed. Please refer to the Current Medication list given to you today.  If you need a refill on your cardiac medications before your next appointment, please call your pharmacy.   Lab work: You will need to have labs (blood work) drawn in 1 weeks:  BMET  CBC  If you have labs (blood work) drawn today and your tests are completely normal, you will receive your results only by: Marland Kitchen MyChart Message (if you have MyChart) OR . A paper copy in the mail If you have any lab test that is abnormal or we need to change your treatment, we will call you to review the results.  Testing/Procedures: NONE ordered at this time of appointment   Follow-Up: At [facility], you and your health needs are our priority.  As part of our continuing mission to provide you with exceptional heart care, we have created designated Provider Care Teams.  These Care Teams include your primary Cardiologist (physician) and Advanced Practice Providers (APPs -  Physician Assistants and Nurse Practitioners) who all work together to provide you with the care you need, when you need it. You will need a follow up appointment in 2.5 weeks with one of the APPs Almyra Deforest, [place], PA-C [place], [place] . Sande Rives, PA-C  Any Other Special Instructions Will Be Listed Below (If Applicable).

## 2019-03-31 NOTE — Telephone Encounter (Signed)
Left a detailed message for the patient on his wife's number that I was calling to go over the AVS and to get him scheduled for a follow up appointment with an APP within 2.5 weeks. Will try calling again

## 2019-04-15 LAB — BASIC METABOLIC PANEL
BUN/Creatinine Ratio: 15 (ref 10–24)
BUN: 21 mg/dL (ref 8–27)
CO2: 22 mmol/L (ref 20–29)
Calcium: 9.3 mg/dL (ref 8.6–10.2)
Chloride: 103 mmol/L (ref 96–106)
Creatinine, Ser: 1.43 mg/dL — ABNORMAL HIGH (ref 0.76–1.27)
GFR calc Af Amer: 59 mL/min/{1.73_m2} — ABNORMAL LOW (ref >59)
GFR calc non Af Amer: 51 mL/min/{1.73_m2} — ABNORMAL LOW (ref >59)
Glucose: 71 mg/dL (ref 65–99)
Potassium: 5.3 mmol/L — ABNORMAL HIGH (ref 3.5–5.2)
Sodium: 137 mmol/L (ref 134–144)

## 2019-04-15 LAB — CBC
Hematocrit: 48.2 % (ref 37.5–51.0)
Hemoglobin: 16.1 g/dL (ref 13.0–17.7)
MCH: 30.1 pg (ref 26.6–33.0)
MCHC: 33.4 g/dL (ref 31.5–35.7)
MCV: 90 fL (ref 79–97)
Platelets: 586 10*3/uL — ABNORMAL HIGH (ref 150–450)
RBC: 5.34 x10E6/uL (ref 4.14–5.80)
RDW: 12.6 % (ref 11.6–15.4)
WBC: 10.1 10*3/uL (ref 3.4–10.8)

## 2019-04-16 NOTE — Telephone Encounter (Signed)
Per CoverMyMeds PA is not needed for the [facility] and the case was closed because the patient has state PPO

## 2019-04-19 ENCOUNTER — Telehealth: Payer: Self-pay

## 2019-04-19 DIAGNOSIS — Z79899 Other long term (current) drug therapy: Secondary | ICD-10-CM

## 2019-04-19 NOTE — Telephone Encounter (Addendum)
Left a voice message on the mobile number for [name] and to have [name] to give our office a call back to go over his lab results.   ----- Message from [place], [place] sent at 04/16/2019 12:43 PM EDT ----- Red blood cell and white blood cell count ok. Renal function stable. Potassium borderline elevated, question if related to entresto. Recommend repeat potassium next week when he sees Luke

## 2019-04-19 NOTE — Telephone Encounter (Signed)
Follow-up:  Patient returning a call From [place] about lab results. Please contact him on his wife's cell phone.

## 2019-04-19 NOTE — Addendum Note (Signed)
Addended by: Fidel Levy on: 04/19/2019 12:42 PM   Modules accepted: Orders

## 2019-04-19 NOTE — Telephone Encounter (Signed)
Patient's wife aware of results. BMET ordered

## 2019-04-22 ENCOUNTER — Ambulatory Visit (INDEPENDENT_AMBULATORY_CARE_PROVIDER_SITE_OTHER): Payer: Medicare HMO | Admitting: Cardiology

## 2019-04-22 ENCOUNTER — Encounter: Payer: Self-pay | Admitting: Cardiology

## 2019-04-22 ENCOUNTER — Other Ambulatory Visit: Payer: Self-pay

## 2019-04-22 VITALS — BP 142/101 | HR 66 | Temp 97.7°F | Ht 67.0 in | Wt 201.8 lb

## 2019-04-22 DIAGNOSIS — I255 Ischemic cardiomyopathy: Secondary | ICD-10-CM | POA: Insufficient documentation

## 2019-04-22 DIAGNOSIS — Z951 Presence of aortocoronary bypass graft: Secondary | ICD-10-CM | POA: Diagnosis not present

## 2019-04-22 DIAGNOSIS — E785 Hyperlipidemia, unspecified: Secondary | ICD-10-CM

## 2019-04-22 DIAGNOSIS — E875 Hyperkalemia: Secondary | ICD-10-CM | POA: Diagnosis not present

## 2019-04-22 DIAGNOSIS — I2510 Atherosclerotic heart disease of native coronary artery without angina pectoris: Secondary | ICD-10-CM | POA: Diagnosis not present

## 2019-04-22 DIAGNOSIS — Z9861 Coronary angioplasty status: Secondary | ICD-10-CM

## 2019-04-22 DIAGNOSIS — N183 Chronic kidney disease, stage 3 unspecified: Secondary | ICD-10-CM

## 2019-04-22 DIAGNOSIS — I7789 Other specified disorders of arteries and arterioles: Secondary | ICD-10-CM | POA: Diagnosis not present

## 2019-04-22 DIAGNOSIS — I10 Essential (primary) hypertension: Secondary | ICD-10-CM | POA: Diagnosis not present

## 2019-04-22 DIAGNOSIS — I2119 ST elevation (STEMI) myocardial infarction involving other coronary artery of inferior wall: Secondary | ICD-10-CM | POA: Diagnosis not present

## 2019-04-22 DIAGNOSIS — N1831 Chronic kidney disease, stage 3a: Secondary | ICD-10-CM | POA: Insufficient documentation

## 2019-04-22 NOTE — Assessment & Plan Note (Signed)
K+ 5.3 on 04/14/2019- repeat today

## 2019-04-22 NOTE — Assessment & Plan Note (Addendum)
SCR 1.43- GFR 51

## 2019-04-22 NOTE — Assessment & Plan Note (Signed)
Check lipids and LFTs 8 weeks

## 2019-04-22 NOTE — Assessment & Plan Note (Signed)
B/P a little high- 140/88 by me.  Check BMP- increase Entresto if his K+ is stable, otherwise add Norvasc 5 mg

## 2019-04-22 NOTE — Patient Instructions (Addendum)
Medication Instructions:  Your physician recommends that you continue on your current medications as directed. Please refer to the Current Medication list given to you today. If you need a refill on your cardiac medications before your next appointment, please call your pharmacy.   Lab work: Your physician recommends that you return for lab work in: [place] AS ECHO If you have labs (blood work) drawn today and your tests are completely normal, you will receive your results only by: Marland Kitchen MyChart Message (if you have MyChart) OR . A paper copy in the mail If you have any lab test that is abnormal or we need to change your treatment, we will call you to review the results.  Testing/Procedures: Your physician has requested that you have an echocardiogram. Echocardiography is a painless test that uses sound waves to create images of your heart. It provides your doctor with information about the size and shape of your heart and how well your heart's chambers and valves are working. This procedure takes approximately one hour. There are no restrictions for this procedure. PLEASE SCHEDULE ECHO IN December  [address]  Follow-Up: At [facility], you and your health needs are our priority.  As part of our continuing mission to provide you with exceptional heart care, we have created designated Provider Care Teams.  These Care Teams include your primary Cardiologist (physician) and Advanced Practice Providers (APPs -  Physician Assistants and Nurse Practitioners) who all work together to provide you with the care you need, when you need it.  . Your physician recommends that you schedule a follow-up appointment in: AFTER ECHO WITH DR CROITORU OR HAO MENG, PA-C AFTER ECHO  Any Other Special Instructions Will Be Listed Below (If Applicable). Patient assistance forms given- you can bring them back and I will fax them for you

## 2019-04-22 NOTE — Assessment & Plan Note (Signed)
EF 35-40% echo 03/23/2019- check f/u echo in Dec 2020

## 2019-04-22 NOTE — Assessment & Plan Note (Signed)
SVG-RCA PCI with DES-( proximal SVG POBA and distal native RCA DES through the SVG) LIMA-LAD and SVG-OM2 patent

## 2019-04-22 NOTE — Progress Notes (Signed)
Cardiology Office Note:    Date:  04/22/2019   ID:  [name], DOB 1955/10/24, MRN [id number]  PCP:  [name], MD  Cardiologist:  Sanda Klein, MD  Electrophysiologist:  None   Referring MD: [name], MD   Chief Complaint  Patient presents with  . Follow-up    History of Present Illness:    [name] is a 63 y.o. male with a hx of coronary disease, status post CABG x3 in 2014.  Other problems include essential hypertension dyslipidemia and a history of a prior CVA in 2012.  The patient did well for a while and then was lost to follow-up.  He said he pretty much stopped taking all his medications.  He was doing some yard work on 03/23/2019 when he had chest pain.  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  He was diuretic.  He became uncooperative and eventually had respiratory failure and had to be intubated.  He was taken urgently to the Cath Lab where catheterization revealed a proximal occlusion of the SVG to the RCA as well as distal RCA occlusion at the anastomosis of the graft.  He underwent angioplasty to the proximal portion of the SVG to RCA and PCI with DES to the distal native RCA.  The LIMA to LAD was patent and the SVG to OM 2 was patent.  His ejection fraction was 35 to 40% by echo.  He also had an incidental finding of a 4.2 cm dilated aortic root.  Patient was discharged on Entresto Toprol high-dose statin aspirin and Brilinta.  He was contacted for [facility] follow-up by phone.  He was doing well but the provider felt it would be prudent to see him in the office in person and review his EKG and medications.  He did have a BM P done 04/14/2019 which showed a potassium of 5.3 and a creatinine of 1.43.  He is seen in the office today for follow-up.  He has been doing well.  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  He is not had recurrent chest pain and is taking all his medications.  Past Medical  History:  Diagnosis Date  . Borderline diabetic   . Coronary artery disease   . Dyslipidemia 01/11/2013  . Hiatal hernia   . Hypertension   . Mini stroke ([place])    2  . S/P CABG x 3   . STEMI (ST elevation myocardial infarction) ([place])   . Stroke [facility])     Past Surgical History:  Procedure Laterality Date  . CARDIAC CATHETERIZATION    . CORONARY ARTERY BYPASS GRAFT N/A 09/01/2012   Procedure: CORONARY ARTERY BYPASS GRAFTING (CABG);  Surgeon: Gaye Pollack, MD;  Location: [place];  Service: Open Heart Surgery;  Laterality: N/A;  Coronary Artery Bypass Grafting Times Three Using Left Internal Mammary Artery and Left Saphenous leg Vein Harvested Endoscopically  . CORONARY STENT INTERVENTION N/A 03/23/2019   Procedure: CORONARY STENT INTERVENTION;  Surgeon: Lorretta Harp, MD;  Location: [place] CV LAB;  Service: Cardiovascular;  Laterality: N/A;  . CORONARY/GRAFT ACUTE MI REVASCULARIZATION N/A 03/23/2019   Procedure: Coronary/Graft Acute MI Revascularization;  Surgeon: Lorretta Harp, MD;  Location: [place] CV LAB;  Service: Cardiovascular;  Laterality: N/A;  . INTRAOPERATIVE TRANSESOPHAGEAL ECHOCARDIOGRAM N/A 09/01/2012   Procedure: INTRAOPERATIVE TRANSESOPHAGEAL ECHOCARDIOGRAM;  Surgeon: Gaye Pollack, MD;  Location: [place] OR;  Service: Open Heart Surgery;  Laterality: N/A;  . LEFT HEART CATH AND CORONARY ANGIOGRAPHY N/A 03/23/2019   Procedure: LEFT HEART CATH AND CORONARY ANGIOGRAPHY;  Surgeon: Lorretta Harp, MD;  Location: [place] CV LAB;  Service: Cardiovascular;  Laterality: N/A;  . LEFT HEART CATHETERIZATION WITH CORONARY ANGIOGRAM N/A 08/31/2012   Procedure: LEFT HEART CATHETERIZATION WITH CORONARY ANGIOGRAM;  Surgeon: Peter M Martinique, MD;  Location: [facility] CATH LAB;  Service: Cardiovascular;  Laterality: N/A;  . PERCUTANEOUS CORONARY STENT INTERVENTION (PCI-S) N/A 08/31/2012   Procedure: PERCUTANEOUS CORONARY STENT INTERVENTION (PCI-S);  Surgeon: Peter M Martinique, MD;  Location: [facility]  CATH LAB;  Service: Cardiovascular;  Laterality: N/A;    Current Medications: Current Meds  Medication Sig  . aspirin 81 MG chewable tablet Chew 81 mg by mouth daily.  Marland Kitchen atorvastatin (LIPITOR) 80 MG tablet Take 1 tablet (80 mg total) by mouth daily at 6 PM.  . metoprolol succinate (TOPROL-XL) 50 MG 24 hr tablet Take 1 tablet (50 mg total) by mouth daily. Take with or immediately following a meal.  . nitroGLYCERIN (NITROSTAT) 0.4 MG SL tablet Place 1 tablet (0.4 mg total) under the tongue every 5 (five) minutes x 3 doses as needed for chest pain.  . sacubitril-valsartan (ENTRESTO) 49-51 MG Take 1 tablet by mouth 2 (two) times daily.  . ticagrelor (BRILINTA) 90 MG TABS tablet Take 1 tablet (90 mg total) by mouth 2 (two) times daily.     Allergies:   Patient has no known allergies.   Social History   Socioeconomic History  . Marital status: Married    Spouse name: Not on file  . Number of children: Not on file  . Years of education: Not on file  . Highest education level: Not on file  Occupational History  . Not on file  Social Needs  . Financial resource strain: Not on file  . Food insecurity    Worry: Not on file    Inability: Not on file  . Transportation needs    Medical: Not on file    Non-medical: Not on file  Tobacco Use  . Smoking status: Former Smoker    Types: Pipe  . Smokeless tobacco: Former Systems developer    Quit date: 07/15/1968  Substance and Sexual Activity  . Alcohol use: No  . Drug use: No  . Sexual activity: Not on file  Lifestyle  . Physical activity    Days per week: Not on file    Minutes per session: Not on file  . Stress: Not on file  Relationships  . Social Herbalist on phone: Not on file    Gets together: Not on file    Attends religious service: Not on file    Active member of club or organization: Not on file    Attends meetings of clubs or organizations: Not on file    Relationship status: Not on file  Other Topics Concern  . Not on  file  Social History Narrative  . Not on file     Family History: The patient's family history includes Cancer in his father; Heart attack in his brother, father, and sister; Hyperlipidemia in his brother and father; Hypertension in his brother, father, and sister.  ROS:   Please see the history of present illness.     All other systems reviewed and are negative.  EKGs/Labs/Other Studies Reviewed:    The following studies were reviewed today: Cath/ PCI 03/23/2019 Echo 03/23/2019  EKG:  EKG is ordered today.  The  ekg ordered today demonstrates NSR HR 57,  inferior Qs with lateral TWI, LAD  Recent Labs: 03/23/2019: ALT 24; TSH 3.395 03/24/2019: Magnesium 2.0 04/14/2019: BUN 21; Creatinine, Ser 1.43; Hemoglobin 16.1; Platelets 586; Potassium 5.3; Sodium 137  Recent Lipid Panel    Component Value Date/Time   CHOL 164 03/25/2019 0239   TRIG 198 (H) 03/25/2019 0239   HDL 31 (L) 03/25/2019 0239   CHOLHDL 5.3 03/25/2019 0239   VLDL 40 03/25/2019 0239   LDLCALC 93 03/25/2019 0239   LDLDIRECT 94.8 03/23/2019 2046    Physical Exam:    VS:  BP (!) 142/101   Pulse 66   Temp 97.7 F (36.5 C)   Ht 5\' 7"  (1.702 m)   Wt 201 lb 12.8 oz (91.5 kg)   BMI 31.61 kg/m     Wt Readings from Last 3 Encounters:  04/22/19 201 lb 12.8 oz (91.5 kg)  03/31/19 200 lb (90.7 kg)  03/26/19 204 lb 5.9 oz (92.7 kg)     GEN:  Well nourished, well developed in no acute distress HEENT: Normal NECK: No JVD; No carotid bruits LYMPHATICS: No lymphadenopathy CARDIAC: RRR, no murmurs, rubs, gallops RESPIRATORY:  Clear to auscultation without rales, wheezing or rhonchi  ABDOMEN: Soft, non-tender, non-distended MUSCULOSKELETAL:  No edema; No deformity  SKIN: Warm and dry NEUROLOGIC:  Alert and oriented x 3 PSYCHIATRIC:  Normal affect   ASSESSMENT:    STEMI (ST elevation myocardial infarction) ([place]) Pt presented 03/23/2019 with inferior STEMI complicated by respiratory failure- intubation  CAD S/P urgent  PCI 03/23/2019 SVG-RCA PCI with DES-( proximal SVG POBA and distal native RCA DES through the SVG) LIMA-LAD and SVG-OM2 patent  Hx of CABG 09/01/12 - S/P CABG X 3 (LIMA to LAD, SVG to OM, SVG to RCA)   Essential hypertension B/P a little high- 140/88 by me.  Check BMP- increase Entresto if his K+ is stable, otherwise add Norvasc 5 mg  Dyslipidemia Check lipids and LFTs 8 weeks  Ischemic cardiomyopathy EF 35-40% echo 03/23/2019- check f/u echo in Dec 2020  Aortic root enlargement (HCC) 4.2 cm on echo 03/23/2019- f/u one year  CRI (chronic renal insufficiency), stage 3 (moderate) SCR 1.43- GFR 51  Hyperkalemia K+ 5.3 on 04/14/2019- repeat today  PLAN:    Check BMP today.  If his K+ is stable then increase Entresto, if his K+ is high add Norvasc for HTN.  Check lipids, CMET, and echo in Dec then f/u with MD.  The patient was provided patient assistance forms for Entresto and Brilinta.    Medication Adjustments/Labs and Tests Ordered: Current medicines are reviewed at length with the patient today.  Concerns regarding medicines are outlined above.  Orders Placed This Encounter  Procedures  . Basic Metabolic Panel (BMET)  . Comprehensive Metabolic Panel (CMET)  . Lipid panel  . ECHOCARDIOGRAM COMPLETE   No orders of the defined types were placed in this encounter.   Patient Instructions  Medication Instructions:  Your physician recommends that you continue on your current medications as directed. Please refer to the Current Medication list given to you today. If you need a refill on your cardiac medications before your next appointment, please call your pharmacy.   Lab work: Your physician recommends that you return for lab work in: [place] AS ECHO If you have labs (blood work) drawn today and your tests are completely normal, you will receive your results only by: Marland Kitchen MyChart Message (if  you have MyChart) OR . A paper copy in the  mail If you have any lab test that is abnormal or we need to change your treatment, we will call you to review the results.  Testing/Procedures: Your physician has requested that you have an echocardiogram. Echocardiography is a painless test that uses sound waves to create images of your heart. It provides your doctor with information about the size and shape of your heart and how well your heart's chambers and valves are working. This procedure takes approximately one hour. There are no restrictions for this procedure. PLEASE SCHEDULE ECHO IN December  [address]  Follow-Up: At [facility], you and your health needs are our priority.  As part of our continuing mission to provide you with exceptional heart care, we have created designated Provider Care Teams.  These Care Teams include your primary Cardiologist (physician) and Advanced Practice Providers (APPs -  Physician Assistants and Nurse Practitioners) who all work together to provide you with the care you need, when you need it.  . Your physician recommends that you schedule a follow-up appointment in: AFTER ECHO WITH HAO MENG, PA-C  Any Other Special Instructions Will Be Listed Below (If Applicable).      Signed, Kerin Ransom, PA-C  04/22/2019 3:04 PM    [facility]

## 2019-04-22 NOTE — Assessment & Plan Note (Signed)
4.2 cm on echo 03/23/2019- f/u one year

## 2019-04-22 NOTE — Assessment & Plan Note (Signed)
09/01/12 - S/P CABG X 3 (LIMA to LAD, SVG to OM, SVG to RCA)

## 2019-04-22 NOTE — Assessment & Plan Note (Addendum)
Pt presented 03/23/2019 with inferior STEMI complicated by respiratory failure- intubation

## 2019-04-23 ENCOUNTER — Other Ambulatory Visit: Payer: Self-pay | Admitting: Cardiology

## 2019-04-23 LAB — BASIC METABOLIC PANEL
BUN/Creatinine Ratio: 13 (ref 10–24)
BUN: 19 mg/dL (ref 8–27)
CO2: 23 mmol/L (ref 20–29)
Calcium: 9.5 mg/dL (ref 8.6–10.2)
Chloride: 104 mmol/L (ref 96–106)
Creatinine, Ser: 1.41 mg/dL — ABNORMAL HIGH (ref 0.76–1.27)
GFR calc Af Amer: 60 mL/min/{1.73_m2} (ref >59)
GFR calc non Af Amer: 52 mL/min/{1.73_m2} — ABNORMAL LOW (ref >59)
Glucose: 88 mg/dL (ref 65–99)
Potassium: 5.3 mmol/L — ABNORMAL HIGH (ref 3.5–5.2)
Sodium: 138 mmol/L (ref 134–144)

## 2019-04-23 MED ORDER — METOPROLOL SUCCINATE ER 50 MG PO TB24: 50.0000 mg | ORAL_TABLET | Freq: Every day | ORAL | 3 refills | Status: DC

## 2019-04-23 MED ORDER — TICAGRELOR 90 MG PO TABS: 90.0000 mg | ORAL_TABLET | Freq: Two times a day (BID) | ORAL | 3 refills | Status: DC

## 2019-04-23 MED ORDER — ATORVASTATIN CALCIUM 80 MG PO TABS: 80.0000 mg | ORAL_TABLET | Freq: Every day | ORAL | 3 refills | Status: DC

## 2019-04-23 MED ORDER — SACUBITRIL-VALSARTAN 49-51 MG PO TABS: 1.0000 | ORAL_TABLET | Freq: Two times a day (BID) | ORAL | 3 refills | Status: DC

## 2019-04-23 NOTE — Telephone Encounter (Signed)
Requested Prescriptions   Signed Prescriptions Disp Refills  . metoprolol succinate (TOPROL-XL) 50 MG 24 hr tablet 30 tablet 3    Sig: Take 1 tablet (50 mg total) by mouth daily. Take with or immediately following a meal.    Authorizing Provider: Erlene Quan    Ordering User: Errika Narvaiz C  . atorvastatin (LIPITOR) 80 MG tablet 30 tablet 3    Sig: Take 1 tablet (80 mg total) by mouth daily at 6 PM.    Authorizing Provider: Erlene Quan    Ordering User: Britt Bottom sacubitril-valsartan (ENTRESTO) 49-51 MG 60 tablet 3    Sig: Take 1 tablet by mouth 2 (two) times daily.    Authorizing Provider: Erlene Quan    Ordering User: Britt Bottom ticagrelor (BRILINTA) 90 MG TABS tablet 60 tablet 3    Sig: Take 1 tablet (90 mg total) by mouth 2 (two) times daily.    Authorizing Provider: Erlene Quan    Ordering User: Britt Bottom

## 2019-04-23 NOTE — Telephone Encounter (Signed)
°*  STAT* If patient is at the pharmacy, call can be transferred to refill team.   1. Which medications need to be refilled? (please list name of each medication and dose if known)  sacubitril-valsartan (ENTRESTO) 49-51 MG ticagrelor (BRILINTA) 90 MG TABS tablet metoprolol succinate (TOPROL-XL) 50 MG 24 hr tablet atorvastatin (LIPITOR) 80 MG tablet  2. Which pharmacy/location (including street and city if local pharmacy) is medication to be sent to?  [place], [place]  3. Do they need a 30 day or 90 day supply? 30  Patient will be out of his medication by Monday

## 2019-04-27 ENCOUNTER — Telehealth: Payer: Self-pay | Admitting: Cardiovascular Disease

## 2019-04-27 DIAGNOSIS — N183 Chronic kidney disease, stage 3 unspecified: Secondary | ICD-10-CM

## 2019-04-27 NOTE — Telephone Encounter (Signed)
New Message  Patient's wife returning call. Please give a call back.

## 2019-04-28 MED ORDER — AMLODIPINE BESYLATE 5 MG PO TABS: 5.0000 mg | ORAL_TABLET | Freq: Every day | ORAL | 0 refills | Status: DC

## 2019-04-28 MED ORDER — ENTRESTO 24-26 MG PO TABS: 1.0000 | ORAL_TABLET | Freq: Two times a day (BID) | ORAL | 0 refills | Status: DC

## 2019-04-28 NOTE — Telephone Encounter (Addendum)
Spoke with patients spouse, Martravious Fryson, per DPR, and gave her Runell Gess instructions: START Norvasc 5mg  Take 1 tablet once a day  DECREASE Entresto from 49/51 to 24/26mg  bid COMPLETE BMET on Monday and follow up in 1 week

## 2019-04-29 ENCOUNTER — Telehealth (HOSPITAL_COMMUNITY): Payer: Self-pay | Admitting: *Deleted

## 2019-04-29 NOTE — Telephone Encounter (Signed)
-----   Message from Sanda Klein, MD sent at 04/28/2019  6:15 PM EDT ----- Regarding: RE: BP Parameters for Cardiac Rehab Amlodipine has been started. OK to start exercise if  SBP< 140 at rest and stays <180 w exercise. DBP<90 at rest and stays <100 w exercise. Thanks ----- Message ----- From: Rowe Pavy, RN Sent: 04/26/2019  12:32 PM EDT To: Sanda Klein, MD Subject: BP Parameters for Cardiac Rehab                Dr. Barrie Dunker,  The above pt is eligible to particpate in Cardiac Rehab S/p 5/8 Stemi and DES to svg/RCA.  Pt seen in follow up on 9/16(telephone) and 10/8 in person with Kerin Ransom.  Noted in his medical history dilated aortic root 4.2 cm - plan for repeat echo in one year.  Pt bp remained elevated upon recheck by Lurena Joiner - 140/88 previous 141/101.  Plan was to increase his Entresto if K+ WNL if not to start Norvasc.  Lab recheck K+ 5.3 same as when checked in September.    Given pt medical history what is an acceptable bp parameters for him at rest and upper limit fo his bp on exertion?  Next follow up is in December with you along with Echo.  Thanks so much for your input  Cherre Huger, BSN Cardiac and Pulmonary Rehab Nurse Navigator

## 2019-04-30 ENCOUNTER — Encounter (HOSPITAL_COMMUNITY): Payer: Self-pay

## 2019-05-03 DIAGNOSIS — Z9861 Coronary angioplasty status: Secondary | ICD-10-CM | POA: Diagnosis not present

## 2019-05-03 DIAGNOSIS — I255 Ischemic cardiomyopathy: Secondary | ICD-10-CM | POA: Diagnosis not present

## 2019-05-03 DIAGNOSIS — I2119 ST elevation (STEMI) myocardial infarction involving other coronary artery of inferior wall: Secondary | ICD-10-CM | POA: Diagnosis not present

## 2019-05-03 DIAGNOSIS — E785 Hyperlipidemia, unspecified: Secondary | ICD-10-CM | POA: Diagnosis not present

## 2019-05-03 DIAGNOSIS — I7789 Other specified disorders of arteries and arterioles: Secondary | ICD-10-CM | POA: Diagnosis not present

## 2019-05-03 DIAGNOSIS — I2510 Atherosclerotic heart disease of native coronary artery without angina pectoris: Secondary | ICD-10-CM | POA: Diagnosis not present

## 2019-05-03 DIAGNOSIS — I10 Essential (primary) hypertension: Secondary | ICD-10-CM | POA: Diagnosis not present

## 2019-05-03 DIAGNOSIS — E875 Hyperkalemia: Secondary | ICD-10-CM | POA: Diagnosis not present

## 2019-05-03 DIAGNOSIS — Z951 Presence of aortocoronary bypass graft: Secondary | ICD-10-CM | POA: Diagnosis not present

## 2019-05-04 LAB — COMPREHENSIVE METABOLIC PANEL
ALT: 19 IU/L (ref 0–44)
AST: 19 IU/L (ref 0–40)
Albumin/Globulin Ratio: 1.4 (ref 1.2–2.2)
Albumin: 4 g/dL (ref 3.8–4.8)
Alkaline Phosphatase: 105 IU/L (ref 39–117)
BUN/Creatinine Ratio: 14 (ref 10–24)
BUN: 18 mg/dL (ref 8–27)
Bilirubin Total: 0.5 mg/dL (ref 0.0–1.2)
CO2: 22 mmol/L (ref 20–29)
Calcium: 9.1 mg/dL (ref 8.6–10.2)
Chloride: 103 mmol/L (ref 96–106)
Creatinine, Ser: 1.33 mg/dL — ABNORMAL HIGH (ref 0.76–1.27)
GFR calc Af Amer: 64 mL/min/{1.73_m2} (ref >59)
GFR calc non Af Amer: 56 mL/min/{1.73_m2} — ABNORMAL LOW (ref >59)
Globulin, Total: 2.9 g/dL (ref 1.5–4.5)
Glucose: 88 mg/dL (ref 65–99)
Potassium: 4.8 mmol/L (ref 3.5–5.2)
Sodium: 140 mmol/L (ref 134–144)
Total Protein: 6.9 g/dL (ref 6.0–8.5)

## 2019-05-04 LAB — LIPID PANEL
Chol/HDL Ratio: 5.7 ratio — ABNORMAL HIGH (ref 0.0–5.0)
Cholesterol, Total: 143 mg/dL (ref 100–199)
HDL: 25 mg/dL — ABNORMAL LOW (ref >39)
LDL Chol Calc (NIH): 59 mg/dL (ref 0–99)
Triglycerides: 382 mg/dL — ABNORMAL HIGH (ref 0–149)
VLDL Cholesterol Cal: 59 mg/dL — ABNORMAL HIGH (ref 5–40)

## 2019-05-06 ENCOUNTER — Telehealth: Payer: Self-pay | Admitting: *Deleted

## 2019-05-06 ENCOUNTER — Encounter: Payer: Self-pay | Admitting: Cardiology

## 2019-05-06 ENCOUNTER — Ambulatory Visit (INDEPENDENT_AMBULATORY_CARE_PROVIDER_SITE_OTHER): Payer: Medicare HMO | Admitting: Cardiology

## 2019-05-06 ENCOUNTER — Other Ambulatory Visit: Payer: Self-pay

## 2019-05-06 VITALS — BP 132/90 | HR 67 | Ht 67.0 in | Wt 203.4 lb

## 2019-05-06 DIAGNOSIS — N183 Chronic kidney disease, stage 3 unspecified: Secondary | ICD-10-CM

## 2019-05-06 DIAGNOSIS — E875 Hyperkalemia: Secondary | ICD-10-CM

## 2019-05-06 DIAGNOSIS — Z8673 Personal history of transient ischemic attack (TIA), and cerebral infarction without residual deficits: Secondary | ICD-10-CM | POA: Diagnosis not present

## 2019-05-06 DIAGNOSIS — I7789 Other specified disorders of arteries and arterioles: Secondary | ICD-10-CM | POA: Diagnosis not present

## 2019-05-06 DIAGNOSIS — Z951 Presence of aortocoronary bypass graft: Secondary | ICD-10-CM

## 2019-05-06 DIAGNOSIS — I255 Ischemic cardiomyopathy: Secondary | ICD-10-CM

## 2019-05-06 DIAGNOSIS — I10 Essential (primary) hypertension: Secondary | ICD-10-CM | POA: Diagnosis not present

## 2019-05-06 DIAGNOSIS — I2510 Atherosclerotic heart disease of native coronary artery without angina pectoris: Secondary | ICD-10-CM | POA: Diagnosis not present

## 2019-05-06 DIAGNOSIS — I2119 ST elevation (STEMI) myocardial infarction involving other coronary artery of inferior wall: Secondary | ICD-10-CM

## 2019-05-06 DIAGNOSIS — Z9861 Coronary angioplasty status: Secondary | ICD-10-CM

## 2019-05-06 NOTE — Progress Notes (Signed)
Cardiology Office Note:    Date:  05/06/2019   ID:  [name], DOB [birth year], MRN [id number]  PCP:  [name], MD  Cardiologist:  Sanda Klein, MD  Electrophysiologist:  None   Referring MD: [name], MD   No chief complaint on file. F/U after medication adjustment  History of Present Illness:    [name] is a 63 y.o. male with a hx of CABG x3 in 2014.  Other problems include essential hypertension dyslipidemia and a history of a prior CVA in 2012.  The patient did well for a while and then was lost to follow-up.  He said he pretty much stopped taking all his medications.    He was doing some yard work on 03/23/2019 when he had chest pain.  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  He was diaphoretic.  He became uncooperative and eventually had respiratory failure and had to be intubated.  He was taken urgently to the Cath Lab where catheterization revealed a proximal occlusion of the SVG to the RCA as well as distal RCA occlusion at the anastomosis of the graft.  He underwent angioplasty to the proximal portion of the SVG to RCA and PCI with DES to the distal native RCA.  The LIMA to LAD was patent and the SVG to OM 2 was patent.  His ejection fraction was 35 to 40% by echo.  He also had an incidental finding of a 4.2 cm dilated aortic root.  Patient was discharged on Entresto Toprol high-dose statin aspirin and Brilinta.  He did have a BM P done 04/14/2019 which showed a potassium of 5.3 and a creatinine of 1.43. I saw him in the office 04/22/2019. His B/P was a little elevated.  I repeated his BMP and his K+ remained elevated.  Based on this I decreased his Entresto and added Norvasc. He returned today for follow up.  He is feeling well, no chest pain or SOB.  B/P by me 124/72.  Past Medical History:  Diagnosis Date  . Borderline diabetic   . Coronary artery disease   . Dyslipidemia 01/11/2013  . Hiatal hernia    . Hypertension   . Mini stroke ([place])    2  . S/P CABG x 3   . STEMI (ST elevation myocardial infarction) ([place])   . Stroke [facility])     Past Surgical History:  Procedure Laterality Date  . CARDIAC CATHETERIZATION    . CORONARY ARTERY BYPASS GRAFT N/A 09/01/2012   Procedure: CORONARY ARTERY BYPASS GRAFTING (CABG);  Surgeon: Gaye Pollack, MD;  Location: [place];  Service: Open Heart Surgery;  Laterality: N/A;  Coronary Artery Bypass Grafting Times Three Using Left Internal Mammary Artery and Left Saphenous leg Vein Harvested Endoscopically  . CORONARY STENT INTERVENTION N/A 03/23/2019   Procedure: CORONARY STENT INTERVENTION;  Surgeon: Lorretta Harp, MD;  Location: [place] CV LAB;  Service: Cardiovascular;  Laterality: N/A;  . CORONARY/GRAFT ACUTE MI REVASCULARIZATION N/A 03/23/2019   Procedure: Coronary/Graft Acute MI Revascularization;  Surgeon: Lorretta Harp, MD;  Location: [place] CV LAB;  Service: Cardiovascular;  Laterality: N/A;  . INTRAOPERATIVE TRANSESOPHAGEAL ECHOCARDIOGRAM N/A 09/01/2012   Procedure: INTRAOPERATIVE TRANSESOPHAGEAL ECHOCARDIOGRAM;  Surgeon: Gaye Pollack, MD;  Location: [place] OR;  Service: Open Heart Surgery;  Laterality: N/A;  . LEFT HEART CATH AND CORONARY ANGIOGRAPHY N/A 03/23/2019   Procedure: LEFT HEART CATH AND CORONARY ANGIOGRAPHY;  Surgeon: Lorretta Harp,  MD;  Location: [place] CV LAB;  Service: Cardiovascular;  Laterality: N/A;  . LEFT HEART CATHETERIZATION WITH CORONARY ANGIOGRAM N/A 08/31/2012   Procedure: LEFT HEART CATHETERIZATION WITH CORONARY ANGIOGRAM;  Surgeon: Peter M Martinique, MD;  Location: [facility] CATH LAB;  Service: Cardiovascular;  Laterality: N/A;  . PERCUTANEOUS CORONARY STENT INTERVENTION (PCI-S) N/A 08/31/2012   Procedure: PERCUTANEOUS CORONARY STENT INTERVENTION (PCI-S);  Surgeon: Peter M Martinique, MD;  Location: [facility] CATH LAB;  Service: Cardiovascular;  Laterality: N/A;    Current Medications: Current Meds  Medication Sig  .  amLODipine (NORVASC) 5 MG tablet Take 1 tablet (5 mg total) by mouth daily.  Marland Kitchen aspirin 81 MG chewable tablet Chew 81 mg by mouth daily.  Marland Kitchen atorvastatin (LIPITOR) 80 MG tablet Take 1 tablet (80 mg total) by mouth daily at 6 PM.  . metoprolol succinate (TOPROL-XL) 50 MG 24 hr tablet Take 1 tablet (50 mg total) by mouth daily. Take with or immediately following a meal.  . nitroGLYCERIN (NITROSTAT) 0.4 MG SL tablet Place 1 tablet (0.4 mg total) under the tongue every 5 (five) minutes x 3 doses as needed for chest pain.  . sacubitril-valsartan (ENTRESTO) 24-26 MG Take 1 tablet by mouth 2 (two) times daily.  . ticagrelor (BRILINTA) 90 MG TABS tablet Take 1 tablet (90 mg total) by mouth 2 (two) times daily.     Allergies:   Patient has no known allergies.   Social History   Socioeconomic History  . Marital status: Married    Spouse name: Not on file  . Number of children: Not on file  . Years of education: Not on file  . Highest education level: Not on file  Occupational History  . Not on file  Social Needs  . Financial resource strain: Not on file  . Food insecurity    Worry: Not on file    Inability: Not on file  . Transportation needs    Medical: Not on file    Non-medical: Not on file  Tobacco Use  . Smoking status: Former Smoker    Types: Pipe  . Smokeless tobacco: Former Systems developer    Quit date: 07/15/1968  Substance and Sexual Activity  . Alcohol use: No  . Drug use: No  . Sexual activity: Not on file  Lifestyle  . Physical activity    Days per week: Not on file    Minutes per session: Not on file  . Stress: Not on file  Relationships  . Social Herbalist on phone: Not on file    Gets together: Not on file    Attends religious service: Not on file    Active member of club or organization: Not on file    Attends meetings of clubs or organizations: Not on file    Relationship status: Not on file  Other Topics Concern  . Not on file  Social History Narrative  .  Not on file     Family History: The patient's family history includes Cancer in his father; Heart attack in his brother, father, and sister; Hyperlipidemia in his brother and father; Hypertension in his brother, father, and sister.  ROS:   Please see the history of present illness.     All other systems reviewed and are negative.  EKGs/Labs/Other Studies Reviewed:    The following studies were reviewed today: Echo 03/24/2019  Recent Labs: 03/23/2019: TSH 3.395 03/24/2019: Magnesium 2.0 04/14/2019: Hemoglobin 16.1; Platelets 586 05/03/2019: ALT 19; BUN 18; Creatinine, Ser 1.33;  Potassium 4.8; Sodium 140  Recent Lipid Panel    Component Value Date/Time   CHOL 143 05/03/2019 1226   TRIG 382 (H) 05/03/2019 1226   HDL 25 (L) 05/03/2019 1226   CHOLHDL 5.7 (H) 05/03/2019 1226   CHOLHDL 5.3 03/25/2019 0239   VLDL 40 03/25/2019 0239   LDLCALC 59 05/03/2019 1226   LDLDIRECT 94.8 03/23/2019 2046    Physical Exam:    VS:  BP 132/90   Pulse 67   Ht 5\' 7"  (1.702 m)   Wt 203 lb 6.4 oz (92.3 kg)   SpO2 98%   BMI 31.86 kg/m     Wt Readings from Last 3 Encounters:  05/06/19 203 lb 6.4 oz (92.3 kg)  04/22/19 201 lb 12.8 oz (91.5 kg)  03/31/19 200 lb (90.7 kg)     GEN:  Well nourished, well developed in no acute distress HEENT: Normal NECK: No JVD; No carotid bruits LYMPHATICS: No lymphadenopathy CARDIAC: RRR, no murmurs, rubs, gallops RESPIRATORY:  Clear to auscultation without rales, wheezing or rhonchi  ABDOMEN: Soft, non-tender, non-distended MUSCULOSKELETAL:  No edema; No deformity  SKIN: Warm and dry NEUROLOGIC:  Alert and oriented x 3 PSYCHIATRIC:  Normal affect   ASSESSMENT:    STEMI (ST elevation myocardial infarction) ([place]) Pt presented 03/23/2019 with inferior STEMI complicated by respiratory failure- intubation  CAD S/P urgent PCI 03/23/2019 SVG-RCA PCI with DES-( proximal SVG POBA and distal native RCA DES through the SVG) LIMA-LAD and SVG-OM2 patent  Hx of  CABG 09/01/12 - S/P CABG X 3 (LIMA to LAD, SVG to OM, SVG to RCA)   Essential hypertension B/P improved on Norvasc- 124/72 by me  Dyslipidemia Check lipids and LFTs 8 weeks  Ischemic cardiomyopathy EF 35-40% echo 03/23/2019- check f/u echo in Dec 2020  Aortic root enlargement (HCC) 4.2 cm on echo 03/23/2019- f/u one year  CRI (chronic renal insufficiency), stage 3 (moderate) SCR 1.33- GFR 56  Hyperkalemia This improved to 4.8 on lower dose of Entresto  PLAN:    Same Rx- he has an echo scheduled for Dec with an OV with Dr Sallyanne Kuster after this.    Medication Adjustments/Labs and Tests Ordered: Current medicines are reviewed at length with the patient today.  Concerns regarding medicines are outlined above.  No orders of the defined types were placed in this encounter.  No orders of the defined types were placed in this encounter.   Patient Instructions  Medication Instructions:  Your physician recommends that you continue on your current medications as directed. Please refer to the Current Medication list given to you today. *If you need a refill on your cardiac medications before your next appointment, please call your pharmacy*  Lab Work: NONE  If you have labs (blood work) drawn today and your tests are completely normal, you will receive your results only by: Marland Kitchen MyChart Message (if you have MyChart) OR . A paper copy in the mail If you have any lab test that is abnormal or we need to change your treatment, we will call you to review the results.  Testing/Procedures: NONE   Follow-Up: At [facility], you and your health needs are our priority.  As part of our continuing mission to provide you with exceptional heart care, we have created designated Provider Care Teams.  These Care Teams include your primary Cardiologist (physician) and Advanced Practice Providers (APPs -  Physician Assistants and Nurse Practitioners) who all work together to provide you with the  care you need, when you  need it.  Your next appointment:   FOLLOW UP AS SCHEDULED   The format for your next appointment:   In Person  Provider:   Sanda Klein, MD  Other Instructions     Signed, Kerin Ransom, PA-C  05/06/2019 4:35 PM    [place]

## 2019-05-06 NOTE — Telephone Encounter (Signed)
PA has been approved  [facility] [employer]  received a request from your provider for coverage of Entresto 24-26MG  OR TABS.  As long as you remain covered by the [facility] and there are no changes to your plan benefits, this request is approved for the following time period: 05/06/2019 - 05/05/2020  Approvals may be subject to dosing limits in accordance with FDA approved labeling, evidence-based practice guidelines or your prescription drug plan benefits. If you have not already done so, you may ask your pharmacist to fill the prescription. If you have questions, please call Customer Care toll-free at the number on your [place] ID card toll-free at [phone].

## 2019-05-06 NOTE — Patient Instructions (Signed)
Medication Instructions:  Your physician recommends that you continue on your current medications as directed. Please refer to the Current Medication list given to you today. *If you need a refill on your cardiac medications before your next appointment, please call your pharmacy*  Lab Work: NONE  If you have labs (blood work) drawn today and your tests are completely normal, you will receive your results only by: Marland Kitchen MyChart Message (if you have MyChart) OR . A paper copy in the mail If you have any lab test that is abnormal or we need to change your treatment, we will call you to review the results.  Testing/Procedures: NONE   Follow-Up: At [facility], you and your health needs are our priority.  As part of our continuing mission to provide you with exceptional heart care, we have created designated Provider Care Teams.  These Care Teams include your primary Cardiologist (physician) and Advanced Practice Providers (APPs -  Physician Assistants and Nurse Practitioners) who all work together to provide you with the care you need, when you need it.  Your next appointment:   FOLLOW UP AS SCHEDULED   The format for your next appointment:   In Person  Provider:   Sanda Klein, MD  Other Instructions

## 2019-05-06 NOTE — Telephone Encounter (Signed)
Prior Authorization for Entresto 24-26 mg has been submitted through CoverMyMeds: key [id number]

## 2019-05-07 ENCOUNTER — Other Ambulatory Visit: Payer: Self-pay | Admitting: *Deleted

## 2019-05-07 MED ORDER — ENTRESTO 24-26 MG PO TABS: 1.0000 | ORAL_TABLET | Freq: Two times a day (BID) | ORAL | 11 refills | Status: DC

## 2019-06-09 ENCOUNTER — Other Ambulatory Visit: Payer: Self-pay | Admitting: Cardiology

## 2019-06-23 ENCOUNTER — Ambulatory Visit (HOSPITAL_COMMUNITY): Payer: Medicare HMO | Attending: Internal Medicine

## 2019-06-23 ENCOUNTER — Other Ambulatory Visit: Payer: Self-pay

## 2019-06-23 DIAGNOSIS — I10 Essential (primary) hypertension: Secondary | ICD-10-CM | POA: Diagnosis present

## 2019-06-23 DIAGNOSIS — I255 Ischemic cardiomyopathy: Secondary | ICD-10-CM | POA: Insufficient documentation

## 2019-06-23 DIAGNOSIS — I7789 Other specified disorders of arteries and arterioles: Secondary | ICD-10-CM | POA: Insufficient documentation

## 2019-06-23 DIAGNOSIS — I2119 ST elevation (STEMI) myocardial infarction involving other coronary artery of inferior wall: Secondary | ICD-10-CM | POA: Insufficient documentation

## 2019-06-23 DIAGNOSIS — Z951 Presence of aortocoronary bypass graft: Secondary | ICD-10-CM | POA: Insufficient documentation

## 2019-06-23 DIAGNOSIS — E785 Hyperlipidemia, unspecified: Secondary | ICD-10-CM | POA: Insufficient documentation

## 2019-06-23 DIAGNOSIS — I2510 Atherosclerotic heart disease of native coronary artery without angina pectoris: Secondary | ICD-10-CM | POA: Diagnosis present

## 2019-06-23 DIAGNOSIS — E875 Hyperkalemia: Secondary | ICD-10-CM

## 2019-06-23 DIAGNOSIS — Z9861 Coronary angioplasty status: Secondary | ICD-10-CM | POA: Diagnosis present

## 2019-06-23 MED ORDER — PERFLUTREN LIPID MICROSPHERE
1.0000 mL | INTRAVENOUS | Status: AC | PRN
  Administered 2019-06-23: 2 mL via INTRAVENOUS

## 2019-06-25 ENCOUNTER — Encounter: Payer: Self-pay | Admitting: Cardiovascular Disease

## 2019-06-25 ENCOUNTER — Ambulatory Visit: Payer: Medicare HMO | Admitting: Cardiovascular Disease

## 2019-06-25 ENCOUNTER — Other Ambulatory Visit: Payer: Self-pay

## 2019-06-25 VITALS — BP 125/81 | HR 60 | Temp 97.1°F | Ht 67.0 in | Wt 205.0 lb

## 2019-06-25 DIAGNOSIS — I5042 Chronic combined systolic (congestive) and diastolic (congestive) heart failure: Secondary | ICD-10-CM | POA: Diagnosis not present

## 2019-06-25 DIAGNOSIS — I77810 Thoracic aortic ectasia: Secondary | ICD-10-CM | POA: Insufficient documentation

## 2019-06-25 DIAGNOSIS — I10 Essential (primary) hypertension: Secondary | ICD-10-CM | POA: Diagnosis not present

## 2019-06-25 DIAGNOSIS — E785 Hyperlipidemia, unspecified: Secondary | ICD-10-CM

## 2019-06-25 DIAGNOSIS — I25810 Atherosclerosis of coronary artery bypass graft(s) without angina pectoris: Secondary | ICD-10-CM | POA: Insufficient documentation

## 2019-06-25 DIAGNOSIS — N1831 Chronic kidney disease, stage 3a: Secondary | ICD-10-CM

## 2019-06-25 MED ORDER — EZETIMIBE 10 MG PO TABS: 10.0000 mg | ORAL_TABLET | Freq: Every day | ORAL | 3 refills | Status: DC

## 2019-06-25 NOTE — Patient Instructions (Signed)
Medication Instructions:  Start taking Zetia 10mg  1 tab every day *If you need a refill on your cardiac medications before your next appointment, please call your pharmacy*  Lab Work: Lipid profile in 3 months (  around March 2021) and you will need to fast prior to labs If you have labs (blood work) drawn today and your tests are completely normal, you will receive your results only by: Marland Kitchen MyChart Message (if you have MyChart) OR . A paper copy in the mail If you have any lab test that is abnormal or we need to change your treatment, we will call you to review the results.  Follow-Up: At [facility], you and your health needs are our priority.  As part of our continuing mission to provide you with exceptional heart care, we have created designated Provider Care Teams.  These Care Teams include your primary Cardiologist (physician) and Advanced Practice Providers (APPs -  Physician Assistants and Nurse Practitioners) who all work together to provide you with the care you need, when you need it.  Your next appointment:   August 2021  The format for your next appointment:   In Person  Provider:   You may see Sanda Klein, MD or one of the following Advanced Practice Providers on your designated Care Team:    Almyra Deforest, PA-C  Fabian Sharp, [place] or   Roby Lofts, [place]   Other Instructions

## 2019-06-25 NOTE — Progress Notes (Signed)
Cardiology Office Note:    Date:  06/25/2019   ID:  [name], DOB March 27, 1956, MRN [id number]  PCP:  [name] Sake, MD  Cardiologist:  [name] Klein, MD  Electrophysiologist:  None   Referring MD: [name] Sake, MD   Chief Complaint  Patient presents with  . Coronary Artery Disease    History of Present Illness:    [name] is a 63 y.o. male with a hx of ischemic stroke in 2012, multivessel CAD leading to bypass surgery in 2014 (LIMA to LAD, SVG-OM 2, SVG-RCA), who presented with acute myocardial infarction due to occlusion at the [facility] anastomosis on March 23, 2019, treated with angioplasty of the proximal SVG and drug-eluting stents to the SVG-RCA anastomosis).  Follow-up echocardiogram shows persistently decreased left ventricular ejection fraction of 35-40% due to inferior/inferolateral wall motion abnormality.  He has a mild dilation of the aortic root at 4.2 cm and dyslipidemia with very low HDL cholesterol and essential hypertension.  He is prediabetic.  He has done very well since his last appointment.  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  He did get a little short of breath when he was cutting down a tree in his yard.  He has not had edema, claudication, focal neurological events.  He was tried twice on Entresto but this had to be stopped due to hyperkalemia.  She is taking a beta-blocker, high-dose statin and dual antiplatelet therapy (aspirin and Brilinta).  Past Medical History:  Diagnosis Date  . Borderline diabetic   . Coronary artery disease   . Dyslipidemia 01/11/2013  . Hiatal hernia   . Hypertension   . Mini stroke ([place])    2  . S/P CABG x 3   . STEMI (ST elevation myocardial infarction) ([place])   . Stroke [facility])     Past Surgical History:  Procedure Laterality Date  . CARDIAC CATHETERIZATION    . CORONARY ARTERY BYPASS GRAFT N/A 09/01/2012   Procedure:  CORONARY ARTERY BYPASS GRAFTING (CABG);  Surgeon: [name] Pollack, MD;  Location: [place];  Service: Open Heart Surgery;  Laterality: N/A;  Coronary Artery Bypass Grafting Times Three Using Left Internal Mammary Artery and Left Saphenous leg Vein Harvested Endoscopically  . CORONARY STENT INTERVENTION N/A 03/23/2019   Procedure: CORONARY STENT INTERVENTION;  Surgeon: [name] Harp, MD;  Location: [place] CV LAB;  Service: Cardiovascular;  Laterality: N/A;  . CORONARY/GRAFT ACUTE MI REVASCULARIZATION N/A 03/23/2019   Procedure: Coronary/Graft Acute MI Revascularization;  Surgeon: [name] Harp, MD;  Location: [place] CV LAB;  Service: Cardiovascular;  Laterality: N/A;  . INTRAOPERATIVE TRANSESOPHAGEAL ECHOCARDIOGRAM N/A 09/01/2012   Procedure: INTRAOPERATIVE TRANSESOPHAGEAL ECHOCARDIOGRAM;  Surgeon: [name] Pollack, MD;  Location: [place] OR;  Service: Open Heart Surgery;  Laterality: N/A;  . LEFT HEART CATH AND CORONARY ANGIOGRAPHY N/A 03/23/2019   Procedure: LEFT HEART CATH AND CORONARY ANGIOGRAPHY;  Surgeon: [name] Harp, MD;  Location: [place] CV LAB;  Service: Cardiovascular;  Laterality: N/A;  . LEFT HEART CATHETERIZATION WITH CORONARY ANGIOGRAM N/A 08/31/2012   Procedure: LEFT HEART CATHETERIZATION WITH CORONARY ANGIOGRAM;  Surgeon: [name] M Martinique, MD;  Location: [facility] CATH LAB;  Service: Cardiovascular;  Laterality: N/A;  . PERCUTANEOUS CORONARY STENT INTERVENTION (PCI-S) N/A 08/31/2012   Procedure: PERCUTANEOUS CORONARY STENT INTERVENTION (PCI-S);  Surgeon: [name] M Martinique, MD;  Location: [facility] CATH LAB;  Service: Cardiovascular;  Laterality: N/A;  Current Medications: Current Meds  Medication Sig  . amLODipine (NORVASC) 5 MG tablet TAKE 1 TABLET BY MOUTH ONCE DAILY  . aspirin 81 MG chewable tablet Chew 81 mg by mouth daily.  Marland Kitchen atorvastatin (LIPITOR) 80 MG tablet Take 1 tablet (80 mg total) by mouth daily at 6 PM.  . metoprolol succinate (TOPROL-XL) 50 MG 24 hr tablet Take 1 tablet (50  mg total) by mouth daily. Take with or immediately following a meal.  . nitroGLYCERIN (NITROSTAT) 0.4 MG SL tablet Place 1 tablet (0.4 mg total) under the tongue every 5 (five) minutes x 3 doses as needed for chest pain.  . sacubitril-valsartan (ENTRESTO) 24-26 MG Take 1 tablet by mouth 2 (two) times daily.  . ticagrelor (BRILINTA) 90 MG TABS tablet Take 1 tablet (90 mg total) by mouth 2 (two) times daily.     Allergies:   Patient has no known allergies.   Social History   Socioeconomic History  . Marital status: Married    Spouse name: Not on file  . Number of children: Not on file  . Years of education: Not on file  . Highest education level: Not on file  Occupational History  . Not on file  Tobacco Use  . Smoking status: Former Smoker    Types: Pipe  . Smokeless tobacco: Former Systems developer    Quit date: 07/15/1968  Substance and Sexual Activity  . Alcohol use: No  . Drug use: No  . Sexual activity: Not on file  Other Topics Concern  . Not on file  Social History Narrative  . Not on file   Social Determinants of Health   Financial Resource Strain:   . Difficulty of Paying Living Expenses: Not on file  Food Insecurity:   . Worried About Charity fundraiser in the Last Year: Not on file  . Ran Out of Food in the Last Year: Not on file  Transportation Needs:   . Lack of Transportation (Medical): Not on file  . Lack of Transportation (Non-Medical): Not on file  Physical Activity:   . Days of Exercise per Week: Not on file  . Minutes of Exercise per Session: Not on file  Stress:   . Feeling of Stress : Not on file  Social Connections:   . Frequency of Communication with Friends and Family: Not on file  . Frequency of Social Gatherings with Friends and Family: Not on file  . Attends Religious Services: Not on file  . Active Member of Clubs or Organizations: Not on file  . Attends Archivist Meetings: Not on file  . Marital Status: Not on file     Family History:  The patient's family history includes Cancer in his father; Heart attack in his brother, father, and sister; Hyperlipidemia in his brother and father; Hypertension in his brother, father, and sister.  ROS:   Please see the history of present illness.     All other systems reviewed and are negative.  EKGs/Labs/Other Studies Reviewed:    The following studies were reviewed today:  ECHO 06/23/2019: 1. Left ventricular ejection fraction, by visual estimation, is 35 to 40%. The left ventricle has moderate to severely decreased function. There is no left ventricular hypertrophy. 2. Left ventricular diastolic parameters are consistent with Grade II diastolic dysfunction (pseudonormalization). 3. The left ventricle demonstrates global hypokinesis with akinesis of the inferior and inferolateral walls. 4. Global right ventricle has mildly reduced systolic function.The right ventricular size is normal. No increase in right  ventricular wall thickness. 5. Left atrial size was mildly dilated. 6. Right atrial size was normal. 7. The mitral valve is normal in structure. Mild mitral valve regurgitation. No evidence of mitral stenosis. 8. The tricuspid valve is normal in structure. Tricuspid valve regurgitation is mild. 9. The aortic valve is tricuspid. Aortic valve regurgitation is trivial. Mild to moderate aortic valve sclerosis/calcification without any evidence of aortic stenosis. 10. The pulmonic valve was normal in structure. Pulmonic valve regurgitation is trivial. 11. Aortic dilatation noted. 12. There is mild dilatation of the ascending aorta measuring 42 mm.  EKG:  EKG is not ordered today.  The ekg ordered 04/23/2019 demonstrates sinus rhythm with Q waves in leads V4-V6 and in the inferior leads  Recent Labs: 03/23/2019: TSH 3.395 03/24/2019: Magnesium 2.0 04/14/2019: Hemoglobin 16.1; Platelets 586 05/03/2019: ALT 19; BUN 18; Creatinine, Ser 1.33; Potassium 4.8; Sodium 140  Recent Lipid  Panel    Component Value Date/Time   CHOL 143 05/03/2019 1226   TRIG 382 (H) 05/03/2019 1226   HDL 25 (L) 05/03/2019 1226   CHOLHDL 5.7 (H) 05/03/2019 1226   CHOLHDL 5.3 03/25/2019 0239   VLDL 40 03/25/2019 0239   LDLCALC 59 05/03/2019 1226   LDLDIRECT 94.8 03/23/2019 2046    Physical Exam:    VS:  BP 125/81   Pulse 60   Temp (!) 97.1 F (36.2 C)   Ht 5\' 7"  (1.702 m)   Wt 205 lb (93 kg)   SpO2 93%   BMI 32.11 kg/m     Wt Readings from Last 3 Encounters:  06/25/19 205 lb (93 kg)  05/06/19 203 lb 6.4 oz (92.3 kg)  04/22/19 201 lb 12.8 oz (91.5 kg)     GEN: Mildly obese, well nourished, well developed in no acute distress HEENT: Normal NECK: No JVD; No carotid bruits LYMPHATICS: No lymphadenopathy CARDIAC: S4 is present.  RRR, no murmurs, rubs RESPIRATORY:  Clear to auscultation without rales, wheezing or rhonchi  ABDOMEN: Soft, non-tender, non-distended MUSCULOSKELETAL:  No edema; No deformity  SKIN: Warm and dry NEUROLOGIC:  Alert and oriented x 3 PSYCHIATRIC:  Normal affect   ASSESSMENT:    1. Coronary artery disease involving coronary bypass graft of native heart without angina pectoris   2. Chronic combined systolic and diastolic heart failure ([place])   3. Essential hypertension   4. Dilated aortic root ([place])   5. Dyslipidemia   6. Stage 3a chronic kidney disease    PLAN:    In order of problems listed above:  1. CAD: Asymptomatic despite being pretty active.  He will stay on dual antiplatelet therapy until September 2021.  Continue beta-blockers and statin. 2. CHF: NYHA functional class I evidence of hypervolemia.  He does not require loop diuretics.  EF is moderately depressed at 35-40%.  There was evidence of elevated filling pressures by echo.  Unable to take RAAS inhibitors due to hyperkalemia 3. HTN: Well-controlled. 4. HLP: LDL cholesterol is not at target (<70).  Add ezetimibe 10 mg daily and recheck lipid profile.  Discussed the fact that his HDL  will not improve without exercise and weight loss discussed ways to gradually increase his level of physical activity 5. Dilated aortic root: At next year's appointment.  May choose to just use echocardiography to avoid contrast exposure due to CKD 6. CKD 3a: Baseline GFR around 55.   Medication Adjustments/Labs and Tests Ordered: Current medicines are reviewed at length with the patient today.  Concerns regarding medicines are outlined above.  Orders Placed This Encounter  Procedures  . Lipid Profile   Meds ordered this encounter  Medications  . ezetimibe (ZETIA) 10 MG tablet    Sig: Take 1 tablet (10 mg total) by mouth daily.    Dispense:  90 tablet    Refill:  3    Patient Instructions  Medication Instructions:  Start taking Zetia 10mg  1 tab every day *If you need a refill on your cardiac medications before your next appointment, please call your pharmacy*  Lab Work: Lipid profile in 3 months (  around March 2021) and you will need to fast prior to labs If you have labs (blood work) drawn today and your tests are completely normal, you will receive your results only by: Marland Kitchen MyChart Message (if you have MyChart) OR . A paper copy in the mail If you have any lab test that is abnormal or we need to change your treatment, we will call you to review the results.  Follow-Up: At [facility], you and your health needs are our priority.  As part of our continuing mission to provide you with exceptional heart care, we have created designated Provider Care Teams.  These Care Teams include your primary Cardiologist (physician) and Advanced Practice Providers (APPs -  Physician Assistants and Nurse Practitioners) who all work together to provide you with the care you need, when you need it.  Your next appointment:   August 2021  The format for your next appointment:   In Person  Provider:   You may see [name] Klein, MD or one of the following Advanced Practice Providers on your  designated Care Team:    Almyra Deforest, PA-C  Fabian Sharp, [place] or   Roby Lofts, [place]   Other Instructions    Signed, [name] Klein, MD  06/25/2019 2:44 PM    [place]

## 2019-07-12 ENCOUNTER — Other Ambulatory Visit: Payer: Self-pay | Admitting: Cardiology

## 2019-07-13 ENCOUNTER — Other Ambulatory Visit: Payer: Self-pay

## 2019-07-13 MED ORDER — AMLODIPINE BESYLATE 5 MG PO TABS: 5.0000 mg | ORAL_TABLET | Freq: Every day | ORAL | 11 refills | Status: DC

## 2019-08-31 ENCOUNTER — Other Ambulatory Visit: Payer: Self-pay | Admitting: Cardiology

## 2019-10-12 DIAGNOSIS — E785 Hyperlipidemia, unspecified: Secondary | ICD-10-CM | POA: Diagnosis not present

## 2019-10-12 LAB — LIPID PANEL
Chol/HDL Ratio: 4.3 ratio (ref 0.0–5.0)
Cholesterol, Total: 126 mg/dL (ref 100–199)
HDL: 29 mg/dL — ABNORMAL LOW (ref >39)
LDL Chol Calc (NIH): 58 mg/dL (ref 0–99)
Triglycerides: 243 mg/dL — ABNORMAL HIGH (ref 0–149)
VLDL Cholesterol Cal: 39 mg/dL (ref 5–40)

## 2019-10-18 ENCOUNTER — Encounter: Payer: Self-pay | Admitting: *Deleted

## 2019-12-16 ENCOUNTER — Telehealth: Payer: Self-pay | Admitting: Cardiology

## 2019-12-16 NOTE — Telephone Encounter (Signed)
New message   Per Helane Gunther has clinical questions in reference to a preauthorization. Please call to discuss.

## 2019-12-16 NOTE — Telephone Encounter (Signed)
Returned call to [facility] they will fax over forms for tier exception for Entresto?    Fax # provided

## 2020-03-17 ENCOUNTER — Ambulatory Visit: Payer: Medicare HMO | Admitting: Cardiovascular Disease

## 2020-05-03 ENCOUNTER — Telehealth: Payer: Self-pay | Admitting: Cardiovascular Disease

## 2020-05-03 ENCOUNTER — Inpatient Hospital Stay (HOSPITAL_COMMUNITY)
Admission: EM | Admit: 2020-05-03 | Discharge: 2020-05-06 | DRG: 250 | Disposition: A | Discharge status: Home / Self Care | Payer: Medicare HMO | Attending: Interventional Cardiology | Admitting: Interventional Cardiology

## 2020-05-03 ENCOUNTER — Encounter (HOSPITAL_COMMUNITY)
Admission: EM | Disposition: A | Discharge status: Home / Self Care | Payer: Self-pay | Source: Home / Self Care | Attending: Interventional Cardiology

## 2020-05-03 DIAGNOSIS — I255 Ischemic cardiomyopathy: Secondary | ICD-10-CM | POA: Diagnosis present

## 2020-05-03 DIAGNOSIS — Z23 Encounter for immunization: Secondary | ICD-10-CM | POA: Diagnosis present

## 2020-05-03 DIAGNOSIS — I472 Ventricular tachycardia: Secondary | ICD-10-CM | POA: Diagnosis not present

## 2020-05-03 DIAGNOSIS — E785 Hyperlipidemia, unspecified: Secondary | ICD-10-CM | POA: Diagnosis present

## 2020-05-03 DIAGNOSIS — Z951 Presence of aortocoronary bypass graft: Secondary | ICD-10-CM | POA: Diagnosis not present

## 2020-05-03 DIAGNOSIS — Z9114 Patient's other noncompliance with medication regimen: Secondary | ICD-10-CM

## 2020-05-03 DIAGNOSIS — I10 Essential (primary) hypertension: Secondary | ICD-10-CM | POA: Diagnosis present

## 2020-05-03 DIAGNOSIS — N1831 Chronic kidney disease, stage 3a: Secondary | ICD-10-CM | POA: Diagnosis present

## 2020-05-03 DIAGNOSIS — I130 Hypertensive heart and chronic kidney disease with heart failure and stage 1 through stage 4 chronic kidney disease, or unspecified chronic kidney disease: Secondary | ICD-10-CM | POA: Diagnosis present

## 2020-05-03 DIAGNOSIS — T82868A Thrombosis of vascular prosthetic devices, implants and grafts, initial encounter: Principal | ICD-10-CM | POA: Diagnosis present

## 2020-05-03 DIAGNOSIS — Z955 Presence of coronary angioplasty implant and graft: Secondary | ICD-10-CM

## 2020-05-03 DIAGNOSIS — I5042 Chronic combined systolic (congestive) and diastolic (congestive) heart failure: Secondary | ICD-10-CM | POA: Diagnosis present

## 2020-05-03 DIAGNOSIS — I25719 Atherosclerosis of autologous vein coronary artery bypass graft(s) with unspecified angina pectoris: Secondary | ICD-10-CM | POA: Diagnosis present

## 2020-05-03 DIAGNOSIS — Z8249 Family history of ischemic heart disease and other diseases of the circulatory system: Secondary | ICD-10-CM | POA: Diagnosis not present

## 2020-05-03 DIAGNOSIS — Z20822 Contact with and (suspected) exposure to covid-19: Secondary | ICD-10-CM | POA: Diagnosis present

## 2020-05-03 DIAGNOSIS — Z83438 Family history of other disorder of lipoprotein metabolism and other lipidemia: Secondary | ICD-10-CM

## 2020-05-03 DIAGNOSIS — I213 ST elevation (STEMI) myocardial infarction of unspecified site: Secondary | ICD-10-CM | POA: Diagnosis present

## 2020-05-03 DIAGNOSIS — Z7982 Long term (current) use of aspirin: Secondary | ICD-10-CM

## 2020-05-03 DIAGNOSIS — I2510 Atherosclerotic heart disease of native coronary artery without angina pectoris: Secondary | ICD-10-CM | POA: Diagnosis not present

## 2020-05-03 DIAGNOSIS — I252 Old myocardial infarction: Secondary | ICD-10-CM

## 2020-05-03 DIAGNOSIS — Z87891 Personal history of nicotine dependence: Secondary | ICD-10-CM | POA: Diagnosis not present

## 2020-05-03 DIAGNOSIS — R231 Pallor: Secondary | ICD-10-CM | POA: Diagnosis not present

## 2020-05-03 DIAGNOSIS — T82855A Stenosis of coronary artery stent, initial encounter: Secondary | ICD-10-CM | POA: Diagnosis present

## 2020-05-03 DIAGNOSIS — I499 Cardiac arrhythmia, unspecified: Secondary | ICD-10-CM | POA: Diagnosis not present

## 2020-05-03 DIAGNOSIS — Z7902 Long term (current) use of antithrombotics/antiplatelets: Secondary | ICD-10-CM | POA: Diagnosis not present

## 2020-05-03 DIAGNOSIS — R0789 Other chest pain: Secondary | ICD-10-CM | POA: Diagnosis not present

## 2020-05-03 DIAGNOSIS — Z8673 Personal history of transient ischemic attack (TIA), and cerebral infarction without residual deficits: Secondary | ICD-10-CM

## 2020-05-03 DIAGNOSIS — I2119 ST elevation (STEMI) myocardial infarction involving other coronary artery of inferior wall: Secondary | ICD-10-CM | POA: Diagnosis present

## 2020-05-03 DIAGNOSIS — E119 Type 2 diabetes mellitus without complications: Secondary | ICD-10-CM | POA: Diagnosis present

## 2020-05-03 DIAGNOSIS — I493 Ventricular premature depolarization: Secondary | ICD-10-CM | POA: Diagnosis present

## 2020-05-03 DIAGNOSIS — E875 Hyperkalemia: Secondary | ICD-10-CM | POA: Diagnosis present

## 2020-05-03 DIAGNOSIS — Z79899 Other long term (current) drug therapy: Secondary | ICD-10-CM

## 2020-05-03 DIAGNOSIS — I2111 ST elevation (STEMI) myocardial infarction involving right coronary artery: Secondary | ICD-10-CM

## 2020-05-03 DIAGNOSIS — R7303 Prediabetes: Secondary | ICD-10-CM | POA: Diagnosis present

## 2020-05-03 DIAGNOSIS — R079 Chest pain, unspecified: Secondary | ICD-10-CM | POA: Diagnosis not present

## 2020-05-03 DIAGNOSIS — I420 Dilated cardiomyopathy: Secondary | ICD-10-CM | POA: Diagnosis not present

## 2020-05-03 DIAGNOSIS — I5022 Chronic systolic (congestive) heart failure: Secondary | ICD-10-CM | POA: Diagnosis not present

## 2020-05-03 HISTORY — PX: LEFT HEART CATH AND CORONARY ANGIOGRAPHY: CATH118249

## 2020-05-03 HISTORY — PX: CORONARY/GRAFT ACUTE MI REVASCULARIZATION: CATH118305

## 2020-05-03 LAB — RESPIRATORY PANEL BY RT PCR (FLU A&B, COVID)
Influenza A by PCR: NEGATIVE
Influenza B by PCR: NEGATIVE
SARS Coronavirus 2 by RT PCR: NEGATIVE

## 2020-05-03 SURGERY — CORONARY/GRAFT ACUTE MI REVASCULARIZATION
Anesthesia: LOCAL

## 2020-05-03 MED ORDER — HEPARIN SODIUM (PORCINE) 1000 UNIT/ML IJ SOLN
INTRAMUSCULAR | Status: AC
  Filled 2020-05-03: qty 1

## 2020-05-03 MED ORDER — ATROPINE SULFATE 1 MG/10ML IJ SOSY
PREFILLED_SYRINGE | INTRAMUSCULAR | Status: AC
  Filled 2020-05-03: qty 10

## 2020-05-03 MED ORDER — TICAGRELOR 90 MG PO TABS
ORAL_TABLET | ORAL | Status: DC | PRN
  Administered 2020-05-03: 180 mg via ORAL

## 2020-05-03 MED ORDER — BIVALIRUDIN BOLUS VIA INFUSION - CUPID
INTRAVENOUS | Status: DC | PRN
  Administered 2020-05-03: 69.75 mg via INTRAVENOUS

## 2020-05-03 MED ORDER — HEPARIN (PORCINE) IN NACL 1000-0.9 UT/500ML-% IV SOLN
INTRAVENOUS | Status: AC
  Filled 2020-05-03: qty 1000

## 2020-05-03 MED ORDER — BIVALIRUDIN TRIFLUOROACETATE 250 MG IV SOLR
INTRAVENOUS | Status: AC
  Filled 2020-05-03: qty 250

## 2020-05-03 MED ORDER — FENTANYL CITRATE (PF) 100 MCG/2ML IJ SOLN
INTRAMUSCULAR | Status: AC
  Filled 2020-05-03: qty 2

## 2020-05-03 MED ORDER — NITROGLYCERIN 1 MG/10 ML FOR IR/CATH LAB
INTRA_ARTERIAL | Status: AC
  Filled 2020-05-03: qty 10

## 2020-05-03 MED ORDER — ATROPINE SULFATE 1 MG/10ML IJ SOSY
PREFILLED_SYRINGE | INTRAMUSCULAR | Status: DC | PRN
  Administered 2020-05-03: 1 mg via INTRAVENOUS

## 2020-05-03 MED ORDER — LIDOCAINE HCL (PF) 1 % IJ SOLN
INTRAMUSCULAR | Status: AC
  Filled 2020-05-03: qty 30

## 2020-05-03 MED ORDER — SODIUM CHLORIDE 0.9 % IV SOLN
INTRAVENOUS | Status: DC | PRN
  Administered 2020-05-03: 1.75 mg/kg/h via INTRAVENOUS

## 2020-05-03 MED ORDER — ONDANSETRON HCL 4 MG/2ML IJ SOLN
INTRAMUSCULAR | Status: DC | PRN
  Administered 2020-05-03: 4 mg via INTRAVENOUS

## 2020-05-03 MED ORDER — NITROGLYCERIN 1 MG/10 ML FOR IR/CATH LAB
INTRA_ARTERIAL | Status: DC | PRN
  Administered 2020-05-03: 200 ug

## 2020-05-03 MED ORDER — HEPARIN SODIUM (PORCINE) 5000 UNIT/ML IJ SOLN
INTRAMUSCULAR | Status: AC
  Administered 2020-05-03: 4000 [IU]
  Filled 2020-05-03: qty 1

## 2020-05-03 MED ORDER — TICAGRELOR 90 MG PO TABS
ORAL_TABLET | ORAL | Status: AC
  Filled 2020-05-03: qty 1

## 2020-05-03 MED ORDER — MORPHINE SULFATE (PF) 4 MG/ML IV SOLN
4.0000 mg | Freq: Once | INTRAVENOUS | Status: AC
  Administered 2020-05-03: 4 mg via INTRAVENOUS
  Filled 2020-05-03: qty 1

## 2020-05-03 MED ORDER — FENTANYL CITRATE (PF) 100 MCG/2ML IJ SOLN
INTRAMUSCULAR | Status: DC | PRN
  Administered 2020-05-03 (×2): 50 ug via INTRAVENOUS

## 2020-05-03 MED ORDER — TIROFIBAN HCL IN NACL 5-0.9 MG/100ML-% IV SOLN
INTRAVENOUS | Status: AC
  Filled 2020-05-03: qty 100

## 2020-05-03 MED ORDER — ONDANSETRON HCL 4 MG/2ML IJ SOLN
INTRAMUSCULAR | Status: AC
  Filled 2020-05-03: qty 2

## 2020-05-03 MED ORDER — TIROFIBAN (AGGRASTAT) BOLUS VIA INFUSION
INTRAVENOUS | Status: DC | PRN
  Administered 2020-05-03: 2325 ug via INTRAVENOUS

## 2020-05-03 MED ORDER — IOHEXOL 350 MG/ML SOLN
INTRAVENOUS | Status: AC
  Filled 2020-05-03: qty 1

## 2020-05-03 MED ORDER — LIDOCAINE HCL (PF) 1 % IJ SOLN
INTRAMUSCULAR | Status: DC | PRN
  Administered 2020-05-03: 5 mL

## 2020-05-03 MED ORDER — HEPARIN (PORCINE) IN NACL 1000-0.9 UT/500ML-% IV SOLN
INTRAVENOUS | Status: DC | PRN
  Administered 2020-05-03 (×2): 500 mL

## 2020-05-03 SURGICAL SUPPLY — 24 items
BALLN SAPPHIRE 2.0X15 (BALLOONS) ×2
BALLN SAPPHIRE 2.5X15 (BALLOONS) ×2
BALLOON SAPPHIRE 2.0X15 (BALLOONS) ×1 IMPLANT
BALLOON SAPPHIRE 2.5X15 (BALLOONS) ×1 IMPLANT
CATH 5FR JL3.5 JR4 ANG PIG MP (CATHETERS) ×2 IMPLANT
CATH INFINITI 5FR JL4 (CATHETERS) ×2 IMPLANT
CATH VISTA GUIDE 6FR MPA1 (CATHETERS) ×4 IMPLANT
ELECT DEFIB PAD ADLT CADENCE (PAD) ×2 IMPLANT
GLIDESHEATH SLEND A-KIT 6F 22G (SHEATH) ×2 IMPLANT
GUIDEWIRE INQWIRE 1.5J.035X260 (WIRE) ×1 IMPLANT
INQWIRE 1.5J .035X260CM (WIRE) ×2
KIT ENCORE 26 ADVANTAGE (KITS) ×2 IMPLANT
KIT HEART LEFT (KITS) ×2 IMPLANT
PACK CARDIAC CATHETERIZATION (CUSTOM PROCEDURE TRAY) ×2 IMPLANT
PINNACLE LONG 6F 25CM (SHEATH) ×2
SHEATH INTRO PINNACLE 6F 25CM (SHEATH) ×1 IMPLANT
SHEATH PINNACLE 6F 10CM (SHEATH) ×2 IMPLANT
SHEATH PROBE COVER 6X72 (BAG) ×2 IMPLANT
TRANSDUCER W/STOPCOCK (MISCELLANEOUS) ×2 IMPLANT
TUBING CIL FLEX 10 FLL-RA (TUBING) ×2 IMPLANT
WIRE ASAHI FIELDER XT 190CM (WIRE) ×2 IMPLANT
WIRE ASAHI PROWATER 180CM (WIRE) ×4 IMPLANT
WIRE COUGAR XT STRL 190CM (WIRE) ×2 IMPLANT
WIRE EMERALD 3MM-J .035X150CM (WIRE) ×2 IMPLANT

## 2020-05-03 NOTE — Telephone Encounter (Signed)
Spoke to Amy with [facility]. She was seeking clarification on the pt's dosing instructions for his Lipitor. I confirmed that the information she has at the pharmacy is accurate per our record of the current prescription.

## 2020-05-03 NOTE — ED Triage Notes (Signed)
Pt comes via [facility], PTA received 0.4 nitro, 324 ASA and 168mcg fentanyl. Called out for CP, cool diaphoretic, and SOB, hx of MI

## 2020-05-03 NOTE — ED Provider Notes (Signed)
[place] EMERGENCY DEPARTMENT Provider Note   CSN: [id number] Arrival date & time: 05/03/20  2242     History Chief Complaint  Patient presents with  . Code STEMI    [name] is a 64 y.o. male history of CAD status post CABG, hypertension, here presenting with a code STEMI.  Patient has substernal chest pain and diaphoresis prior to arrival.  EMS noticed that he is very uncomfortable and cool and diaphoretic. EKG showed inferior lateral STEMI and code STEMI was activated in the field.  He was given aspirin 324 mg and fentanyl.  Patient is still having 8 out of 10 chest pain.  The history is provided by the patient.       Past Medical History:  Diagnosis Date  . Borderline diabetic   . Coronary artery disease   . Dyslipidemia 01/11/2013  . Hiatal hernia   . Hypertension   . Mini stroke ([place])    2  . S/P CABG x 3   . STEMI (ST elevation myocardial infarction) ([place])   . Stroke [facility])     Patient Active Problem List   Diagnosis Date Noted  . Coronary artery disease involving coronary bypass graft of native heart without angina pectoris 06/25/2019  . Chronic combined systolic and diastolic heart failure ([place]) 06/25/2019  . Dilated aortic root ([place]) 06/25/2019  . CAD S/P urgent PCI 03/23/2019 04/22/2019  . Ischemic cardiomyopathy 04/22/2019  . Aortic root enlargement ([place]) 04/22/2019  . Stage 3a chronic kidney disease ([place]) 04/22/2019  . Hyperkalemia 04/22/2019  . Acute respiratory failure with hypoxia ([place])   . Altered mental status 03/23/2019  . Status post CVA 02/03/2013  . Weakness 01/11/2013  . Dyslipidemia 01/11/2013  . Hx of CABG 09/02/2012  . STEMI (ST elevation myocardial infarction) ([place]) 09/01/2012  . Essential hypertension   . Borderline diabetic   . Hiatal hernia     Past Surgical History:  Procedure Laterality Date  . CARDIAC CATHETERIZATION    . CORONARY ARTERY BYPASS GRAFT N/A 09/01/2012   Procedure: CORONARY ARTERY BYPASS  GRAFTING (CABG);  Surgeon: Gaye Pollack, MD;  Location: [place];  Service: Open Heart Surgery;  Laterality: N/A;  Coronary Artery Bypass Grafting Times Three Using Left Internal Mammary Artery and Left Saphenous leg Vein Harvested Endoscopically  . CORONARY STENT INTERVENTION N/A 03/23/2019   Procedure: CORONARY STENT INTERVENTION;  Surgeon: Lorretta Harp, MD;  Location: [place] CV LAB;  Service: Cardiovascular;  Laterality: N/A;  . CORONARY/GRAFT ACUTE MI REVASCULARIZATION N/A 03/23/2019   Procedure: Coronary/Graft Acute MI Revascularization;  Surgeon: Lorretta Harp, MD;  Location: [place] CV LAB;  Service: Cardiovascular;  Laterality: N/A;  . INTRAOPERATIVE TRANSESOPHAGEAL ECHOCARDIOGRAM N/A 09/01/2012   Procedure: INTRAOPERATIVE TRANSESOPHAGEAL ECHOCARDIOGRAM;  Surgeon: Gaye Pollack, MD;  Location: [place] OR;  Service: Open Heart Surgery;  Laterality: N/A;  . LEFT HEART CATH AND CORONARY ANGIOGRAPHY N/A 03/23/2019   Procedure: LEFT HEART CATH AND CORONARY ANGIOGRAPHY;  Surgeon: Lorretta Harp, MD;  Location: [place] CV LAB;  Service: Cardiovascular;  Laterality: N/A;  . LEFT HEART CATHETERIZATION WITH CORONARY ANGIOGRAM N/A 08/31/2012   Procedure: LEFT HEART CATHETERIZATION WITH CORONARY ANGIOGRAM;  Surgeon: Peter M Martinique, MD;  Location: [facility] CATH LAB;  Service: Cardiovascular;  Laterality: N/A;  . PERCUTANEOUS CORONARY STENT INTERVENTION (PCI-S) N/A 08/31/2012   Procedure: PERCUTANEOUS CORONARY STENT INTERVENTION (PCI-S);  Surgeon: Peter M Martinique, MD;  Location: [facility] CATH LAB;  Service: Cardiovascular;  Laterality: N/A;  Family History  Problem Relation Age of Onset  . Cancer Father   . Hypertension Father   . Hyperlipidemia Father   . Heart attack Father   . Hypertension Sister   . Hypertension Brother   . Hyperlipidemia Brother   . Heart attack Sister   . Heart attack Brother     Social History   Tobacco Use  . Smoking status: Former Smoker    Types: Pipe  .  Smokeless tobacco: Former Systems developer    Quit date: 07/15/1968  Substance Use Topics  . Alcohol use: No  . Drug use: No    Home Medications Prior to Admission medications   Medication Sig Start Date End Date Taking? Authorizing Provider  amLODipine (NORVASC) 5 MG tablet Take 1 tablet (5 mg total) by mouth daily. 07/13/19   Croitoru, Mihai, MD  aspirin 81 MG chewable tablet Chew 81 mg by mouth daily.    [provider]  atorvastatin (LIPITOR) 80 MG tablet TAKE 1 TABLET (80 MG TOTAL) BY MOUTH DAILY AT 6 PM. 08/31/19   Kilroy, Luke K, PA-C  BRILINTA 90 MG TABS tablet TAKE 1 TABLET BY MOUTH 2 TIMES DAILY. 08/31/19   Erlene Quan, PA-C  ezetimibe (ZETIA) 10 MG tablet Take 1 tablet (10 mg total) by mouth daily. 06/25/19 09/23/19  Croitoru, Mihai, MD  metoprolol succinate (TOPROL-XL) 50 MG 24 hr tablet TAKE 1 TABLET (50 MG TOTAL) BY MOUTH DAILY. TAKE WITH OR IMMEDIATELY FOLLOWING A MEAL. 08/31/19   Erlene Quan, PA-C  nitroGLYCERIN (NITROSTAT) 0.4 MG SL tablet Place 1 tablet (0.4 mg total) under the tongue every 5 (five) minutes x 3 doses as needed for chest pain. 03/26/19   Barrett, Evelene Croon, PA-C  sacubitril-valsartan (ENTRESTO) 24-26 MG Take 1 tablet by mouth 2 (two) times daily. 05/07/19   Erlene Quan, PA-C    Allergies    Patient has no known allergies.  Review of Systems   Review of Systems  Cardiovascular: Positive for chest pain.  All other systems reviewed and are negative.   Physical Exam Updated Vital Signs BP (!) 181/118   Pulse (!) 124   Temp (!) 95.7 F (35.4 C) (Temporal)   Resp (!) 26   SpO2 100%   Physical Exam Vitals and nursing note reviewed.  Constitutional:      Comments: Uncomfortable, diaphoretic   HENT:     Head: Normocephalic.     Nose: Nose normal.     Mouth/Throat:     Mouth: Mucous membranes are moist.  Eyes:     Extraocular Movements: Extraocular movements intact.     Pupils: Pupils are equal, round, and reactive to light.  Cardiovascular:      Rate and Rhythm: Normal rate and regular rhythm.     Pulses: Normal pulses.  Pulmonary:     Effort: Pulmonary effort is normal.     Breath sounds: Normal breath sounds.  Abdominal:     General: Abdomen is flat.     Palpations: Abdomen is soft.  Musculoskeletal:     Cervical back: Normal range of motion.     Comments: 1 + edema   Skin:    General: Skin is warm.     Capillary Refill: Capillary refill takes less than 2 seconds.  Neurological:     General: No focal deficit present.     Mental Status: He is oriented to person, place, and time.  Psychiatric:        Mood and Affect: Mood normal.  Behavior: Behavior normal.     ED Results / Procedures / Treatments   Labs (all labs ordered are listed, but only abnormal results are displayed) Labs Reviewed  RESPIRATORY PANEL BY RT PCR (FLU A&B, COVID)  CBC WITH DIFFERENTIAL/PLATELET  COMPREHENSIVE METABOLIC PANEL  I-STAT CHEM 8, ED  TROPONIN I (HIGH SENSITIVITY)    EKG None  Radiology No results found.  Procedures Procedures (including critical care time)  CRITICAL CARE Performed by: Wandra Arthurs   Total critical care time: 30 minutes  Critical care time was exclusive of separately billable procedures and treating other patients.  Critical care was necessary to treat or prevent imminent or life-threatening deterioration.  Critical care was time spent personally by me on the following activities: development of treatment plan with patient and/or surrogate as well as nursing, discussions with consultants, evaluation of patient's response to treatment, examination of patient, obtaining history from patient or surrogate, ordering and performing treatments and interventions, ordering and review of laboratory studies, ordering and review of radiographic studies, pulse oximetry and re-evaluation of patient's condition.   Medications Ordered in ED Medications  morphine 4 MG/ML injection 4 mg (4 mg Intravenous Given  05/03/20 2249)  heparin 5000 UNIT/ML injection (4,000 Units  Given 05/03/20 2248)    ED Course  I have reviewed the triage vital signs and the nursing notes.  Pertinent labs & imaging results that were available during my care of the patient were reviewed by me and considered in my medical decision making (see chart for details).    MDM Rules/Calculators/A&P                         Dshawn Mcnay is a 64 y.o. male here presenting with chest pain.  Patient has a inferior lateral STEMI on EKG.  Patient received aspirin prior to arrival.  Still has active chest pain.  Ordered heparin bolus and drip and morphine.  Cardiology at bedside.  Patient will take up to the Cath Lab emergently for STEMI. Labs sent and pending    Final Clinical Impression(s) / ED Diagnoses Final diagnoses:  ST elevation myocardial infarction (STEMI), unspecified artery [facility])    Rx / DC Orders ED Discharge Orders    None       Drenda Freeze, MD 05/03/20 2259

## 2020-05-03 NOTE — H&P (Signed)
Cardiology Admission History and Physical:   Patient ID: [name] MRN: [id number]; DOB: 05-Dec-1955   Admission date: 05/03/2020  Primary Care Provider: [name], MD [facility] Cardiologist: Sanda Klein, MD  [facility] Electrophysiologist:  None   Chief Complaint:  Chest Pain  Patient Profile:   [name] is a 64 y.o. male with history of CAD s/p CABG (LIMA-LAD, SVG-OM2, SVG-RCA) and STEMI 03/2019 with angioplasty to proximal SVG-RCA and PCI to distal SVG-RCA anastamosis site, HTN, HLD and prior CVA who presents with STEMI.   History of Present Illness:   [name] was in his usual state of health until he developed sudden onset chest pain this evening. He called EMS and was found to have findings of an inferior STEMI on ECG. He has had ongoing pain, shortness of breath and diaphoresis. On arrival to the ER, he continues to complain of 5/10 pain. He notes that he has been out of his medications for a few weeks but reports he has been taking aspirin. He denies any nausea, lower leg swelling, orthopnea or PND.   Per his wife, he has not been out of medications for weeks but instead has not taken them in the past 3 days. He is scheduled to be paid tomorrow and they were planning to pick up his medications after that.    Past Medical History:  Diagnosis Date  . Borderline diabetic   . Coronary artery disease   . Dyslipidemia 01/11/2013  . Hiatal hernia   . Hypertension   . Mini stroke ([place])    2  . S/P CABG x 3   . STEMI (ST elevation myocardial infarction) ([place])   . Stroke [facility])     Past Surgical History:  Procedure Laterality Date  . CARDIAC CATHETERIZATION    . CORONARY ARTERY BYPASS GRAFT N/A 09/01/2012   Procedure: CORONARY ARTERY BYPASS GRAFTING (CABG);  Surgeon: Gaye Pollack, MD;  Location: [place];  Service: Open Heart Surgery;  Laterality: N/A;  Coronary Artery Bypass Grafting Times Three Using Left Internal Mammary Artery and Left Saphenous  leg Vein Harvested Endoscopically  . CORONARY STENT INTERVENTION N/A 03/23/2019   Procedure: CORONARY STENT INTERVENTION;  Surgeon: Lorretta Harp, MD;  Location: [place] CV LAB;  Service: Cardiovascular;  Laterality: N/A;  . CORONARY/GRAFT ACUTE MI REVASCULARIZATION N/A 03/23/2019   Procedure: Coronary/Graft Acute MI Revascularization;  Surgeon: Lorretta Harp, MD;  Location: [place] CV LAB;  Service: Cardiovascular;  Laterality: N/A;  . INTRAOPERATIVE TRANSESOPHAGEAL ECHOCARDIOGRAM N/A 09/01/2012   Procedure: INTRAOPERATIVE TRANSESOPHAGEAL ECHOCARDIOGRAM;  Surgeon: Gaye Pollack, MD;  Location: [place] OR;  Service: Open Heart Surgery;  Laterality: N/A;  . LEFT HEART CATH AND CORONARY ANGIOGRAPHY N/A 03/23/2019   Procedure: LEFT HEART CATH AND CORONARY ANGIOGRAPHY;  Surgeon: Lorretta Harp, MD;  Location: [place] CV LAB;  Service: Cardiovascular;  Laterality: N/A;  . LEFT HEART CATHETERIZATION WITH CORONARY ANGIOGRAM N/A 08/31/2012   Procedure: LEFT HEART CATHETERIZATION WITH CORONARY ANGIOGRAM;  Surgeon: Peter M Martinique, MD;  Location[facility] CATH LAB;  Service: Cardiovascular;  Laterality: N/A;  . PERCUTANEOUS CORONARY STENT INTERVENTION (PCI-S) N/A 08/31/2012   Procedure: PERCUTANEOUS CORONARY STENT INTERVENTION (PCI-S);  Surgeon: Peter M Martinique, MD;  Location: [facility] CATH LAB;  Service: Cardiovascular;  Laterality: N/A;     Medications Prior to Admission: Prior to Admission medications   Medication Sig Start Date End Date Taking? Authorizing Provider  amLODipine (NORVASC) 5 MG tablet Take 1 tablet (5 mg total) by  mouth daily. 07/13/19   Croitoru, Mihai, MD  aspirin 81 MG chewable tablet Chew 81 mg by mouth daily.    [provider]  atorvastatin (LIPITOR) 80 MG tablet TAKE 1 TABLET (80 MG TOTAL) BY MOUTH DAILY AT 6 PM. 08/31/19   Kilroy, Luke K, PA-C  BRILINTA 90 MG TABS tablet TAKE 1 TABLET BY MOUTH 2 TIMES DAILY. 08/31/19   Erlene Quan, PA-C  ezetimibe (ZETIA) 10 MG tablet Take  1 tablet (10 mg total) by mouth daily. 06/25/19 09/23/19  Croitoru, Mihai, MD  metoprolol succinate (TOPROL-XL) 50 MG 24 hr tablet TAKE 1 TABLET (50 MG TOTAL) BY MOUTH DAILY. TAKE WITH OR IMMEDIATELY FOLLOWING A MEAL. 08/31/19   Erlene Quan, PA-C  nitroGLYCERIN (NITROSTAT) 0.4 MG SL tablet Place 1 tablet (0.4 mg total) under the tongue every 5 (five) minutes x 3 doses as needed for chest pain. 03/26/19   Barrett, Evelene Croon, PA-C  sacubitril-valsartan (ENTRESTO) 24-26 MG Take 1 tablet by mouth 2 (two) times daily. 05/07/19   Erlene Quan, PA-C     Allergies:   No Known Allergies  Social History:   Social History   Socioeconomic History  . Marital status: Married    Spouse name: Not on file  . Number of children: Not on file  . Years of education: Not on file  . Highest education level: Not on file  Occupational History  . Not on file  Tobacco Use  . Smoking status: Former Smoker    Types: Pipe  . Smokeless tobacco: Former Systems developer    Quit date: 07/15/1968  Substance and Sexual Activity  . Alcohol use: No  . Drug use: No  . Sexual activity: Not on file  Other Topics Concern  . Not on file  Social History Narrative  . Not on file   Social Determinants of Health   Financial Resource Strain:   . Difficulty of Paying Living Expenses: Not on file  Food Insecurity:   . Worried About Charity fundraiser in the Last Year: Not on file  . Ran Out of Food in the Last Year: Not on file  Transportation Needs:   . Lack of Transportation (Medical): Not on file  . Lack of Transportation (Non-Medical): Not on file  Physical Activity:   . Days of Exercise per Week: Not on file  . Minutes of Exercise per Session: Not on file  Stress:   . Feeling of Stress : Not on file  Social Connections:   . Frequency of Communication with Friends and Family: Not on file  . Frequency of Social Gatherings with Friends and Family: Not on file  . Attends Religious Services: Not on file  . Active Member of  Clubs or Organizations: Not on file  . Attends Archivist Meetings: Not on file  . Marital Status: Not on file  Intimate Partner Violence:   . Fear of Current or Ex-Partner: Not on file  . Emotionally Abused: Not on file  . Physically Abused: Not on file  . Sexually Abused: Not on file    Family History:   The patient's family history includes Cancer in his father; Heart attack in his brother, father, and sister; Hyperlipidemia in his brother and father; Hypertension in his brother, father, and sister.    ROS:  Please see the history of present illness.  All other ROS reviewed and negative.     Physical Exam/Data:   Vitals:   05/03/20 2247 05/03/20 2248  BP:  (!) 181/118  Pulse: (!) 124   Resp: (!) 26   Temp:  (!) 95.7 F (35.4 C)  TempSrc:  Temporal  SpO2:  100%   No intake or output data in the 24 hours ending 05/03/20 2303 Last 3 Weights 06/25/2019 05/06/2019 04/22/2019  Weight (lbs) 205 lb 203 lb 6.4 oz 201 lb 12.8 oz  Weight (kg) 92.987 kg 92.262 kg 91.536 kg     There is no height or weight on file to calculate BMI.  General:  Well nourished, well developed,appears ill and in pain HEENT: normal Neck: no JVD Cardiac:  normal S1, S2; RRR; occasional ectopy, no significant murmurs Lungs:  clear to auscultation bilaterally, no wheezing, rhonchi or rales. Breathing comfortably on room air.  Abd: soft, nontender, no hepatomegaly  Ext: no edema, cool to the touch Skin: Cool, clammy  Neuro:  CNs 2-12 intact, no focal abnormalities noted  EKG:  The ECG that was done was personally reviewed and demonstrates sinus rhythm with occasional PVCs, significant ST elevation inferiorly in leads II, III, aVF with reciprocal changes.  Relevant CV Studies: Echo 06/2019 1. Left ventricular ejection fraction, by visual estimation, is 35 to  40%. The left ventricle has moderate to severely decreased function. There  is no left ventricular hypertrophy.  2. Left ventricular  diastolic parameters are consistent with Grade II  diastolic dysfunction (pseudonormalization).  3. The left ventricle demonstrates global hypokinesis with akinesis of  the inferior and inferolateral walls.  4. Global right ventricle has mildly reduced systolic function.The right  ventricular size is normal. No increase in right ventricular wall  thickness.  5. Left atrial size was mildly dilated.  6. Right atrial size was normal.  7. The mitral valve is normal in structure. Mild mitral valve  regurgitation. No evidence of mitral stenosis.  8. The tricuspid valve is normal in structure. Tricuspid valve  regurgitation is mild.  9. The aortic valve is tricuspid. Aortic valve regurgitation is trivial.  Mild to moderate aortic valve sclerosis/calcification without any evidence  of aortic stenosis.  10. The pulmonic valve was normal in structure. Pulmonic valve  regurgitation is trivial.  11. Aortic dilatation noted.  12. There is mild dilatation of the ascending aorta measuring 42 mm.   LHC 03/2019  Prox RCA lesion is 100% stenosed.  Prox Cx lesion is 100% stenosed.  Mid LAD lesion is 100% stenosed.  Origin to Prox Graft lesion is 100% stenosed.  Dist RCA lesion is 100% stenosed.  Post intervention, there is a 0% residual stenosis.  A stent was successfully placed.  Post intervention, there is a 0% residual stenosis.   Laboratory Data:  High Sensitivity Troponin:  No results for input(s): TROPONINIHS in the last 720 hours.    ChemistryNo results for input(s): NA, K, CL, CO2, GLUCOSE, BUN, CREATININE, CALCIUM, GFRNONAA, GFRAA, ANIONGAP in the last 168 hours.  No results for input(s): PROT, ALBUMIN, AST, ALT, ALKPHOS, BILITOT in the last 168 hours. HematologyNo results for input(s): WBC, RBC, HGB, HCT, MCV, MCH, MCHC, RDW, PLT in the last 168 hours. BNPNo results for input(s): BNP, PROBNP in the last 168 hours.  DDimer No results for input(s): DDIMER in the last 168  hours.   Radiology/Studies:  No results found.   Assessment and Plan:   1. STEMI- Occluded SVG-PDA s/p PCI.  - S/p aspirin 325mg   - Continue aspirin 81mg   - S/p plavix 600mg   - Continue plavix 75mg  daily  - Restart lipitor 80mg  and zetia  10mg  - Check lipid panel in morning - Check HgA1c  - Echo in morning - Restart home metoprolol  - Will need assistance getting medications on discharge  - Trend troponins - Continue low-dose IV nitro to help with   2. Hypertension- Poorly controlled but has been off of medical therapy for several weeks. - Restart metoprolol as above  - Restart amlodipine 5mg  - Consider restarting entresto but may be cost prohibitive  - Hydralazine PRN   3. Ischemic cardiomyopathy - Repeat echo in AM - Restart metoprolol XL - Restart entresto during admission if not cost prohibitive - Consider SGLT2 inhibitor   4. Hyperlipidemia - Check lipid panel - Restart lipitor 80mg  and zetia 10mg     TIMI Risk Score for ST  Elevation MI:   The patient's TIMI risk score is 3, which indicates a 4.4% risk of all cause mortality at 30 days.       Severity of Illness: The appropriate patient status for this patient is INPATIENT. Inpatient status is judged to be reasonable and necessary in order to provide the required intensity of service to ensure the patient's safety. The patient's presenting symptoms, physical exam findings, and initial radiographic and laboratory data in the context of their chronic comorbidities is felt to place them at high risk for further clinical deterioration. Furthermore, it is not anticipated that the patient will be medically stable for discharge from the hospital within 2 midnights of admission. The following factors support the patient status of inpatient.   " The patient's presenting symptoms include chest pain, diaphoresis, shortness of breath " The worrisome physical exam findings include diaphoresis. " The initial radiographic and  laboratory data are worrisome because of ST elevation on ECG. " The chronic co-morbidities include known CAD, HTN, HLD.   * I certify that at the point of admission it is my clinical judgment that the patient will require inpatient hospital care spanning beyond 2 midnights from the point of admission due to high intensity of service, high risk for further deterioration and high frequency of surveillance required.*    For questions or updates, please contact [place] Please consult [email or web address] for contact info under     Signed, Princella Pellegrini, MD  05/03/2020 11:03 PM

## 2020-05-03 NOTE — ED Notes (Signed)
Patient to cath lab with cardiology, RN and RR RN.

## 2020-05-03 NOTE — Telephone Encounter (Signed)
Amy with [facility] is calling to verify atorvastatin (LIPITOR) 80 MG tablet medication dosage instructions. Please return call to discuss.

## 2020-05-04 ENCOUNTER — Inpatient Hospital Stay (HOSPITAL_COMMUNITY): Payer: Medicare HMO

## 2020-05-04 ENCOUNTER — Encounter (HOSPITAL_COMMUNITY): Payer: Self-pay | Admitting: Interventional Cardiology

## 2020-05-04 DIAGNOSIS — I2510 Atherosclerotic heart disease of native coronary artery without angina pectoris: Secondary | ICD-10-CM | POA: Diagnosis not present

## 2020-05-04 DIAGNOSIS — I5042 Chronic combined systolic (congestive) and diastolic (congestive) heart failure: Secondary | ICD-10-CM | POA: Diagnosis present

## 2020-05-04 DIAGNOSIS — I2119 ST elevation (STEMI) myocardial infarction involving other coronary artery of inferior wall: Secondary | ICD-10-CM | POA: Diagnosis not present

## 2020-05-04 DIAGNOSIS — I2111 ST elevation (STEMI) myocardial infarction involving right coronary artery: Secondary | ICD-10-CM | POA: Diagnosis not present

## 2020-05-04 DIAGNOSIS — I10 Essential (primary) hypertension: Secondary | ICD-10-CM | POA: Diagnosis not present

## 2020-05-04 DIAGNOSIS — Z951 Presence of aortocoronary bypass graft: Secondary | ICD-10-CM

## 2020-05-04 DIAGNOSIS — I472 Ventricular tachycardia: Secondary | ICD-10-CM | POA: Diagnosis not present

## 2020-05-04 DIAGNOSIS — Z83438 Family history of other disorder of lipoprotein metabolism and other lipidemia: Secondary | ICD-10-CM | POA: Diagnosis not present

## 2020-05-04 DIAGNOSIS — T82868A Thrombosis of vascular prosthetic devices, implants and grafts, initial encounter: Secondary | ICD-10-CM | POA: Diagnosis not present

## 2020-05-04 DIAGNOSIS — Z87891 Personal history of nicotine dependence: Secondary | ICD-10-CM | POA: Diagnosis not present

## 2020-05-04 DIAGNOSIS — E785 Hyperlipidemia, unspecified: Secondary | ICD-10-CM | POA: Diagnosis present

## 2020-05-04 DIAGNOSIS — Z20822 Contact with and (suspected) exposure to covid-19: Secondary | ICD-10-CM | POA: Diagnosis present

## 2020-05-04 DIAGNOSIS — Z8249 Family history of ischemic heart disease and other diseases of the circulatory system: Secondary | ICD-10-CM | POA: Diagnosis not present

## 2020-05-04 DIAGNOSIS — Z8673 Personal history of transient ischemic attack (TIA), and cerebral infarction without residual deficits: Secondary | ICD-10-CM | POA: Diagnosis not present

## 2020-05-04 DIAGNOSIS — I25719 Atherosclerosis of autologous vein coronary artery bypass graft(s) with unspecified angina pectoris: Secondary | ICD-10-CM | POA: Diagnosis present

## 2020-05-04 DIAGNOSIS — Z23 Encounter for immunization: Secondary | ICD-10-CM | POA: Diagnosis not present

## 2020-05-04 DIAGNOSIS — Z9114 Patient's other noncompliance with medication regimen: Secondary | ICD-10-CM | POA: Diagnosis not present

## 2020-05-04 DIAGNOSIS — E119 Type 2 diabetes mellitus without complications: Secondary | ICD-10-CM | POA: Diagnosis present

## 2020-05-04 DIAGNOSIS — I493 Ventricular premature depolarization: Secondary | ICD-10-CM | POA: Diagnosis not present

## 2020-05-04 DIAGNOSIS — Z955 Presence of coronary angioplasty implant and graft: Secondary | ICD-10-CM | POA: Diagnosis not present

## 2020-05-04 DIAGNOSIS — Z79899 Other long term (current) drug therapy: Secondary | ICD-10-CM | POA: Diagnosis not present

## 2020-05-04 DIAGNOSIS — Z7902 Long term (current) use of antithrombotics/antiplatelets: Secondary | ICD-10-CM | POA: Diagnosis not present

## 2020-05-04 DIAGNOSIS — T82855A Stenosis of coronary artery stent, initial encounter: Secondary | ICD-10-CM | POA: Diagnosis present

## 2020-05-04 DIAGNOSIS — Z7982 Long term (current) use of aspirin: Secondary | ICD-10-CM | POA: Diagnosis not present

## 2020-05-04 DIAGNOSIS — I5022 Chronic systolic (congestive) heart failure: Secondary | ICD-10-CM | POA: Diagnosis not present

## 2020-05-04 DIAGNOSIS — I130 Hypertensive heart and chronic kidney disease with heart failure and stage 1 through stage 4 chronic kidney disease, or unspecified chronic kidney disease: Secondary | ICD-10-CM | POA: Diagnosis not present

## 2020-05-04 DIAGNOSIS — I255 Ischemic cardiomyopathy: Secondary | ICD-10-CM

## 2020-05-04 DIAGNOSIS — N1831 Chronic kidney disease, stage 3a: Secondary | ICD-10-CM | POA: Diagnosis present

## 2020-05-04 DIAGNOSIS — E875 Hyperkalemia: Secondary | ICD-10-CM | POA: Diagnosis present

## 2020-05-04 DIAGNOSIS — I213 ST elevation (STEMI) myocardial infarction of unspecified site: Secondary | ICD-10-CM | POA: Diagnosis present

## 2020-05-04 LAB — LIPID PANEL
Cholesterol: 174 mg/dL (ref 0–200)
Cholesterol: 190 mg/dL (ref 0–200)
HDL: 28 mg/dL — ABNORMAL LOW (ref >40)
HDL: 29 mg/dL — ABNORMAL LOW (ref >40)
LDL Cholesterol: 114 mg/dL — ABNORMAL HIGH (ref 0–99)
LDL Cholesterol: 126 mg/dL — ABNORMAL HIGH (ref 0–99)
Total CHOL/HDL Ratio: 6 RATIO
Total CHOL/HDL Ratio: 6.8 RATIO
Triglycerides: 153 mg/dL — ABNORMAL HIGH (ref <150)
Triglycerides: 182 mg/dL — ABNORMAL HIGH (ref <150)
VLDL: 31 mg/dL (ref 0–40)
VLDL: 36 mg/dL (ref 0–40)

## 2020-05-04 LAB — BASIC METABOLIC PANEL
Anion gap: 9 (ref 5–15)
BUN: 15 mg/dL (ref 8–23)
CO2: 21 mmol/L — ABNORMAL LOW (ref 22–32)
Calcium: 8.3 mg/dL — ABNORMAL LOW (ref 8.9–10.3)
Chloride: 107 mmol/L (ref 98–111)
Creatinine, Ser: 1.15 mg/dL (ref 0.61–1.24)
GFR, Estimated: >60 mL/min (ref >60)
Glucose, Bld: 121 mg/dL — ABNORMAL HIGH (ref 70–99)
Potassium: 4.2 mmol/L (ref 3.5–5.1)
Sodium: 137 mmol/L (ref 135–145)

## 2020-05-04 LAB — CBC WITH DIFFERENTIAL/PLATELET
Abs Immature Granulocytes: 0.05 10*3/uL (ref 0.00–0.07)
Basophils Absolute: 0.1 10*3/uL (ref 0.0–0.1)
Basophils Relative: 1 %
Eosinophils Absolute: 0.2 10*3/uL (ref 0.0–0.5)
Eosinophils Relative: 2 %
HCT: 45.1 % (ref 39.0–52.0)
Hemoglobin: 15.3 g/dL (ref 13.0–17.0)
Immature Granulocytes: 0 %
Lymphocytes Relative: 29 %
Lymphs Abs: 4.2 10*3/uL — ABNORMAL HIGH (ref 0.7–4.0)
MCH: 31 pg (ref 26.0–34.0)
MCHC: 33.9 g/dL (ref 30.0–36.0)
MCV: 91.3 fL (ref 80.0–100.0)
Monocytes Absolute: 1.1 10*3/uL — ABNORMAL HIGH (ref 0.1–1.0)
Monocytes Relative: 8 %
Neutro Abs: 8.6 10*3/uL — ABNORMAL HIGH (ref 1.7–7.7)
Neutrophils Relative %: 60 %
Platelets: 316 10*3/uL (ref 150–400)
RBC: 4.94 MIL/uL (ref 4.22–5.81)
RDW: 13.4 % (ref 11.5–15.5)
WBC: 14.4 10*3/uL — ABNORMAL HIGH (ref 4.0–10.5)
nRBC: 0 % (ref 0.0–0.2)

## 2020-05-04 LAB — COMPREHENSIVE METABOLIC PANEL
ALT: 19 U/L (ref 0–44)
AST: 25 U/L (ref 15–41)
Albumin: 3.8 g/dL (ref 3.5–5.0)
Alkaline Phosphatase: 65 U/L (ref 38–126)
Anion gap: 14 (ref 5–15)
BUN: 16 mg/dL (ref 8–23)
CO2: 18 mmol/L — ABNORMAL LOW (ref 22–32)
Calcium: 8.7 mg/dL — ABNORMAL LOW (ref 8.9–10.3)
Chloride: 103 mmol/L (ref 98–111)
Creatinine, Ser: 1.45 mg/dL — ABNORMAL HIGH (ref 0.61–1.24)
GFR, Estimated: 50 mL/min — ABNORMAL LOW (ref >60)
Glucose, Bld: 156 mg/dL — ABNORMAL HIGH (ref 70–99)
Potassium: 3.3 mmol/L — ABNORMAL LOW (ref 3.5–5.1)
Sodium: 135 mmol/L (ref 135–145)
Total Bilirubin: 0.8 mg/dL (ref 0.3–1.2)
Total Protein: 7.2 g/dL (ref 6.5–8.1)

## 2020-05-04 LAB — GLUCOSE, CAPILLARY
Glucose-Capillary: 136 mg/dL — ABNORMAL HIGH (ref 70–99)
Glucose-Capillary: 139 mg/dL — ABNORMAL HIGH (ref 70–99)
Glucose-Capillary: 114 mg/dL — ABNORMAL HIGH (ref 70–99)

## 2020-05-04 LAB — CBC
HCT: 42.4 % (ref 39.0–52.0)
Hemoglobin: 14.3 g/dL (ref 13.0–17.0)
MCH: 31.4 pg (ref 26.0–34.0)
MCHC: 33.7 g/dL (ref 30.0–36.0)
MCV: 93 fL (ref 80.0–100.0)
Platelets: 278 10*3/uL (ref 150–400)
RBC: 4.56 MIL/uL (ref 4.22–5.81)
RDW: 13.4 % (ref 11.5–15.5)
WBC: 11.9 10*3/uL — ABNORMAL HIGH (ref 4.0–10.5)
nRBC: 0 % (ref 0.0–0.2)

## 2020-05-04 LAB — PROTIME-INR
INR: 1.2 (ref 0.8–1.2)
INR: 5.6 (ref 0.8–1.2)
Prothrombin Time: 14.5 seconds (ref 11.4–15.2)
Prothrombin Time: 49.1 seconds — ABNORMAL HIGH (ref 11.4–15.2)

## 2020-05-04 LAB — TROPONIN I (HIGH SENSITIVITY)
Troponin I (High Sensitivity): 26486 ng/L (ref <18)
Troponin I (High Sensitivity): 30 ng/L — ABNORMAL HIGH (ref <18)

## 2020-05-04 LAB — POCT I-STAT, CHEM 8
BUN: 20 mg/dL (ref 8–23)
Calcium, Ion: 1.13 mmol/L — ABNORMAL LOW (ref 1.15–1.40)
Chloride: 101 mmol/L (ref 98–111)
Creatinine, Ser: 1.2 mg/dL (ref 0.61–1.24)
Glucose, Bld: 153 mg/dL — ABNORMAL HIGH (ref 70–99)
HCT: 46 % (ref 39.0–52.0)
Hemoglobin: 15.6 g/dL (ref 13.0–17.0)
Potassium: 3.1 mmol/L — ABNORMAL LOW (ref 3.5–5.1)
Sodium: 135 mmol/L (ref 135–145)
TCO2: 17 mmol/L — ABNORMAL LOW (ref 22–32)

## 2020-05-04 LAB — BRAIN NATRIURETIC PEPTIDE
B Natriuretic Peptide: 1169.8 pg/mL — ABNORMAL HIGH (ref 0.0–100.0)
B Natriuretic Peptide: 1408.4 pg/mL — ABNORMAL HIGH (ref 0.0–100.0)

## 2020-05-04 LAB — POCT ACTIVATED CLOTTING TIME
Activated Clotting Time: 296 seconds
Activated Clotting Time: 109 seconds

## 2020-05-04 LAB — APTT
aPTT: 29 seconds (ref 24–36)
aPTT: >200 seconds (ref 24–36)

## 2020-05-04 LAB — MRSA PCR SCREENING: MRSA by PCR: NEGATIVE

## 2020-05-04 SURGERY — CORONARY/GRAFT ACUTE MI REVASCULARIZATION
Anesthesia: LOCAL

## 2020-05-04 MED ORDER — CLOPIDOGREL BISULFATE 75 MG PO TABS
75.0000 mg | ORAL_TABLET | Freq: Every day | ORAL | Status: DC
  Administered 2020-05-04 – 2020-05-06 (×3): 75 mg via ORAL
  Filled 2020-05-04 (×3): qty 1

## 2020-05-04 MED ORDER — IOHEXOL 350 MG/ML SOLN
INTRAVENOUS | Status: DC | PRN
  Administered 2020-05-04: 150 mL

## 2020-05-04 MED ORDER — ACETAMINOPHEN 325 MG PO TABS: 650.0000 mg | ORAL_TABLET | ORAL | Status: DC | PRN

## 2020-05-04 MED ORDER — SODIUM CHLORIDE 0.9 % IV SOLN: 250.0000 mL | INTRAVENOUS | Status: DC | PRN

## 2020-05-04 MED ORDER — AMLODIPINE BESYLATE 5 MG PO TABS
5.0000 mg | ORAL_TABLET | Freq: Every day | ORAL | Status: DC
  Administered 2020-05-04: 5 mg via ORAL
  Filled 2020-05-04 (×2): qty 1

## 2020-05-04 MED ORDER — SODIUM CHLORIDE 0.9% FLUSH
3.0000 mL | Freq: Two times a day (BID) | INTRAVENOUS | Status: DC
  Administered 2020-05-05 (×2): 3 mL via INTRAVENOUS

## 2020-05-04 MED ORDER — EZETIMIBE 10 MG PO TABS
10.0000 mg | ORAL_TABLET | Freq: Every day | ORAL | Status: DC
  Administered 2020-05-04 – 2020-05-06 (×3): 10 mg via ORAL
  Filled 2020-05-04 (×3): qty 1

## 2020-05-04 MED ORDER — SODIUM CHLORIDE 0.9 % IV SOLN: INTRAVENOUS | Status: AC

## 2020-05-04 MED ORDER — NITROGLYCERIN IN D5W 200-5 MCG/ML-% IV SOLN
20.0000 ug/min | INTRAVENOUS | Status: DC
  Administered 2020-05-04: 20 ug/min via INTRAVENOUS

## 2020-05-04 MED ORDER — ONDANSETRON HCL 4 MG/2ML IJ SOLN: 4.0000 mg | Freq: Four times a day (QID) | INTRAMUSCULAR | Status: DC | PRN

## 2020-05-04 MED ORDER — METOPROLOL SUCCINATE ER 50 MG PO TB24
50.0000 mg | ORAL_TABLET | Freq: Every day | ORAL | Status: DC
  Administered 2020-05-04: 50 mg via ORAL
  Filled 2020-05-04: qty 1

## 2020-05-04 MED ORDER — TIROFIBAN HCL IN NACL 5-0.9 MG/100ML-% IV SOLN
INTRAVENOUS | Status: AC | PRN
  Administered 2020-05-03: 0.075 ug/kg/min via INTRAVENOUS

## 2020-05-04 MED ORDER — OXYCODONE HCL 5 MG PO TABS: 5.0000 mg | ORAL_TABLET | ORAL | Status: DC | PRN

## 2020-05-04 MED ORDER — PERFLUTREN LIPID MICROSPHERE
1.0000 mL | INTRAVENOUS | Status: AC | PRN
  Administered 2020-05-04: 4 mL via INTRAVENOUS
  Filled 2020-05-04: qty 10

## 2020-05-04 MED ORDER — INFLUENZA VAC A&B SA ADJ QUAD 0.5 ML IM PRSY
0.5000 mL | PREFILLED_SYRINGE | INTRAMUSCULAR | Status: AC
  Administered 2020-05-05: 0.5 mL via INTRAMUSCULAR
  Filled 2020-05-04: qty 0.5

## 2020-05-04 MED ORDER — TIROFIBAN HCL IN NACL 5-0.9 MG/100ML-% IV SOLN
0.1500 ug/kg/min | INTRAVENOUS | Status: AC
  Administered 2020-05-04 (×2): 0.15 ug/kg/min via INTRAVENOUS
  Filled 2020-05-04 (×2): qty 100

## 2020-05-04 MED ORDER — CLOPIDOGREL BISULFATE 300 MG PO TABS
ORAL_TABLET | ORAL | Status: DC | PRN
  Administered 2020-05-04: 600 mg via ORAL

## 2020-05-04 MED ORDER — SACUBITRIL-VALSARTAN 24-26 MG PO TABS
1.0000 | ORAL_TABLET | Freq: Two times a day (BID) | ORAL | Status: DC
  Administered 2020-05-04 – 2020-05-06 (×5): 1 via ORAL
  Filled 2020-05-04 (×6): qty 1

## 2020-05-04 MED ORDER — LABETALOL HCL 5 MG/ML IV SOLN
10.0000 mg | INTRAVENOUS | Status: AC | PRN
  Administered 2020-05-04: 10 mg via INTRAVENOUS
  Filled 2020-05-04: qty 4

## 2020-05-04 MED ORDER — SODIUM CHLORIDE 0.9% FLUSH: 3.0000 mL | INTRAVENOUS | Status: DC | PRN

## 2020-05-04 MED ORDER — HYDRALAZINE HCL 20 MG/ML IJ SOLN: 10.0000 mg | INTRAMUSCULAR | Status: AC | PRN

## 2020-05-04 MED ORDER — CHLORHEXIDINE GLUCONATE CLOTH 2 % EX PADS
6.0000 | MEDICATED_PAD | Freq: Every day | CUTANEOUS | Status: DC
  Administered 2020-05-04 – 2020-05-05 (×2): 6 via TOPICAL

## 2020-05-04 MED ORDER — ATORVASTATIN CALCIUM 80 MG PO TABS: 80.0000 mg | ORAL_TABLET | Freq: Every day | ORAL | Status: DC

## 2020-05-04 MED ORDER — HEPARIN SODIUM (PORCINE) 5000 UNIT/ML IJ SOLN
5000.0000 [IU] | Freq: Three times a day (TID) | INTRAMUSCULAR | Status: DC
  Administered 2020-05-04 – 2020-05-06 (×4): 5000 [IU] via SUBCUTANEOUS
  Filled 2020-05-04 (×4): qty 1

## 2020-05-04 MED ORDER — ATORVASTATIN CALCIUM 80 MG PO TABS
80.0000 mg | ORAL_TABLET | Freq: Every day | ORAL | Status: DC
  Administered 2020-05-04 – 2020-05-06 (×3): 80 mg via ORAL
  Filled 2020-05-04 (×3): qty 1

## 2020-05-04 MED ORDER — CLOPIDOGREL BISULFATE 300 MG PO TABS
ORAL_TABLET | ORAL | Status: AC
  Filled 2020-05-04: qty 1

## 2020-05-04 MED ORDER — ASPIRIN 81 MG PO CHEW
81.0000 mg | CHEWABLE_TABLET | Freq: Every day | ORAL | Status: DC
  Administered 2020-05-04 – 2020-05-06 (×3): 81 mg via ORAL
  Filled 2020-05-04 (×3): qty 1

## 2020-05-04 NOTE — Progress Notes (Addendum)
ANTIPLATELET CONSULT NOTE  Pharmacy Consult for Aggrastat Indication: Post-PCI  No Known Allergies  Patient Measurements: Height: 5\' 7"  (170.2 cm) Weight: 93 kg (205 lb 0.4 oz) IBW/kg (Calculated) : 66.1  Vital Signs: Temp: 98.9 F (37.2 C) (10/21 1118) Temp Source: Oral (10/21 1118) BP: 113/76 (10/21 1300) Pulse Rate: 79 (10/21 1315)  Labs: Recent Labs    05/03/20 2330 05/04/20 0536 05/04/20 0743  HGB 15.3 14.3  --   HCT 45.1 42.4  --   PLT 316 278  --   APTT >200*  --  29  LABPROT 49.1*  --  14.5  INR 5.6*  --  1.2  CREATININE 1.45* 1.15  --   TROPONINIHS 30* 25,852*  --     Estimated Creatinine Clearance: 68.7 mL/min (by C-G formula based on SCr of 1.15 mg/dL).   Medications:  Aggrastat 0.15 mcg/kg/min   Assessment: 64 y.o. male presenting 10/20 with chest pain, found to have STEMI. Underwent left heart cath and coronary angiography for thrombotic occlusion thought the be secondary to multiple days of missed Brilinta. Today, he has remained stable post-PCI. His CBC is wnl with Hgb 14.3, Hct 42.4, and Pltc 278. Plan to complete 18 hours of IV Aggrastat therapy.    Goal of Therapy:  Stable Hgb, Hct, and Pltc Monitor platelets by anticoagulation protocol: Yes   Plan:  Continue Aggrastat 0.15 mcg/kg/min to complete 18 hour course as ordered by Dr. Tamala Julian post-PCI.   Claudina Lick, PharmD PGY1 Acute Care Pharmacy Resident 05/04/2020 2:05 PM  Please check [email or web address] for unit-specific pharmacy phone numbers.

## 2020-05-04 NOTE — Progress Notes (Signed)
  Echocardiogram 2D Echocardiogram has been performed.  Matilde Bash 05/04/2020, 9:38 AM

## 2020-05-04 NOTE — CV Procedure (Signed)
   Acute inferior infarct due to acute occlusion of the saphenous vein graft to the right coronary in the distal vessel and likely stent thrombosis, late.  Has been off Brilinta for 3 days.  Of the distal right coronary graft with establishment of TIMI grade III flow with difficulty due to thrombus and previously placed distal 2.5 mm stent in the native right coronary.  Diffuse disease in saphenous vein graft to the marginal with up to 60 to 70% diffuse mid body disease.  Patent LIMA to LAD  Occluded native right  Occluded proximal circumflex  Occluded LAD  V. tach lasting approximately 1 minute upon entry into the left ventricle with the Judkins right catheter.  It was self terminating.  EDP was not recorded.  Plan to pull sheath when ACT less than 150.

## 2020-05-04 NOTE — Progress Notes (Signed)
   Free of chest pain this morning.  He is lying flat and denies dyspnea.  On IV nitroglycerin and Aggrastat.  Heart rates have ranged between 45 and 100 bpm.  Blood pressure 140/80.  He has irregular rhythm.  ECG this morning demonstrates resolution of ST elevation, inferior Q waves, and ventricular bigeminy.  Overall impression: Acute occlusion of saphenous vein graft to the right coronary.  Second occurrence with previous being in October 2020.  A new stent was not implanted.  Unable to afford his cardiovascular medications.  Chronic systolic heart failure on beta-blocker therapy and Entresto.  PLAN: Wean and DC nitro as heart failure therapy is resumed.  Continue Aggrastat for for 18 hours.  Antiplatelet regimen will be aspirin and Plavix.  Overall CV prognosis is guarded given systolic dysfunction, severe RCA and circumflex bypass graft disease, and high risk profile.  At least need to consider whether repeat surgical revascularization is an option, however this seems less than optimal given that surgery was performed only 3 years ago.  Discussed with Dr. Sallyanne Kuster his primary cardiologist.

## 2020-05-04 NOTE — Progress Notes (Signed)
Progress Note  Patient Name: [name] Date of Encounter: 05/04/2020  [facility] Cardiologist: Sanda Klein, MD   Subjective   Angina free. Just had femoral sheath removed from right groin, site looks good. On aggrastat.  Inpatient Medications    Scheduled Meds: . amLODipine  5 mg Oral Daily  . aspirin  81 mg Oral Daily  . atorvastatin  80 mg Oral Daily  . clopidogrel  75 mg Oral Daily  . ezetimibe  10 mg Oral Daily  . heparin  5,000 Units Subcutaneous Q8H  . metoprolol succinate  50 mg Oral Daily  . sacubitril-valsartan  1 tablet Oral BID  . sodium chloride flush  3 mL Intravenous Q12H   Continuous Infusions: . sodium chloride    . nitroGLYCERIN 20 mcg/min (05/04/20 0133)  . tirofiban 0.15 mcg/kg/min (05/04/20 0045)   PRN Meds: sodium chloride, acetaminophen, ondansetron (ZOFRAN) IV, oxyCODONE, sodium chloride flush   Vital Signs    Vitals:   05/04/20 0715 05/04/20 0730 05/04/20 0952 05/04/20 1118  BP:   (!) 143/84   Pulse: 66 69 80   Resp: 17 18    Temp:    98.9 F (37.2 C)  TempSrc:    Oral  SpO2: 94% 92%    Weight:      Height:        Intake/Output Summary (Last 24 hours) at 05/04/2020 1159 Last data filed at 05/04/2020 0600 Gross per 24 hour  Intake --  Output 150 ml  Net -150 ml   Last 3 Weights 05/03/2020 06/25/2019 05/06/2019  Weight (lbs) 205 lb 0.4 oz 205 lb 203 lb 6.4 oz  Weight (kg) 93 kg 92.987 kg 92.262 kg      Telemetry    SR with frequent PVCs, very short NSVT bursts are rare now - Personally Reviewed  ECG    SR w ventricular bigeminy, inferior Q waves, ST elevation almost resolved - Personally Reviewed  Physical Exam  Comfortable lying fully flat GEN: No acute distress.   Neck: No JVD Cardiac: Bigeminal rhythm, no murmurs, rubs, or gallops.  Respiratory: Clear to auscultation bilaterally. GI: Soft, nontender, non-distended  MS: No edema; No deformity. No hematoma R groin Neuro:  Nonfocal  Psych: Normal affect    Labs    High Sensitivity Troponin:   Recent Labs  Lab 05/03/20 2330 05/04/20 0536  TROPONINIHS 30* 26,486*      Chemistry Recent Labs  Lab 05/03/20 2330 05/04/20 0536  NA 135 137  K 3.3* 4.2  CL 103 107  CO2 18* 21*  GLUCOSE 156* 121*  BUN 16 15  CREATININE 1.45* 1.15  CALCIUM 8.7* 8.3*  PROT 7.2  --   ALBUMIN 3.8  --   AST 25  --   ALT 19  --   ALKPHOS 65  --   BILITOT 0.8  --   GFRNONAA 50* >60  ANIONGAP 14 9     Hematology Recent Labs  Lab 05/03/20 2330 05/04/20 0536  WBC 14.4* 11.9*  RBC 4.94 4.56  HGB 15.3 14.3  HCT 45.1 42.4  MCV 91.3 93.0  MCH 31.0 31.4  MCHC 33.9 33.7  RDW 13.4 13.4  PLT 316 278    BNP Recent Labs  Lab 05/03/20 2330 05/04/20 0536  BNP 1,169.8* 1,408.4*     DDimer No results for input(s): DDIMER in the last 168 hours.   Radiology    CARDIAC CATHETERIZATION  Result Date: 05/04/2020  Acute inferior STEMI related to thrombotic occlusion of the  saphenous vein graft to the right coronary, with probable late stent thrombosis after missing Brilinta for 3 days.  Successful angioplasty with reperfusion of the inferior wall reducing the 100% stenosis in the distal bypass graft and native vessel anastomosis, using balloon angioplasty reducing the stenosis to 50% with TIMI grade III flow.  Diffuse proximal to mid 70% stenosis in the vein graft to the obtuse marginal  Patent LIMA to LAD  Totally occluded proximal RCA  Totally occluded proximal to mid circumflex  Totally occluded mid LAD.  Ostial 60% first diagonal.  Widely patent left main  LVEDP was not obtained as the patient developed ventricular tachycardia upon entry into the LV with the Judkins right catheter. RECOMMENDATIONS:  IV nitroglycerin for blood pressure, improvement in microcirculatory flow, and to reduce LV end-diastolic pressures.  Aspirin and Plavix should be continued indefinitely.  Consider coronary bypass grafting of the to degenerated vein conduits as  noted above.  Risk factor modification   Cardiac Studies   Bedside echo review: LV dilated (EDD 6.0 cm), EF 25%, akinetic inferior and inferolateral walls, other walls hypokinetic, no LV clot. RV mildly dilated and moderately depressed. Dilated RA and LA. Mild MR and AR. No effusion. Normal mean LA pressure.  Patient Profile     64 y.o. male with extensive CAD s/p CABG 2014 (after presentation with STEMI LCX territory) and recurrent inferior STEMI (SVG-RCA occlusion in Sept 2020), presents with another inferolateral STEMI and reocclusion of SVG-RCA graft, early deterioration of other SVG-OM2, with severely depressed LV function and history of frequent NSVT.  Medical problems complicated by imperfect compliance with meds and f/u due to financial issues.  Assessment & Plan    1. CAD s/p inf STEMI: to improve odds of compliance, DAPT with ASA+ clopidogrel (would continue indefinitely due to complexity of CAD). On IV aggrastat and IV NTG today. Accelerated graft failure.  Re-evaluate after a few months to see if redo CABG with arterial grafts may be an option. 2. CHF: normal filling pressures at this time. No need for daily diuretic, but this may help him tolerate RAAS inhibitors. Need to try to get back on Entresto (patient assistance program?), beta blocker gradually. Previous attempts to use RAAS inhibitors led to hyperkalemia, watch K closely. Don't give spironolactone. 3. HLP: Has severe dyslipidemia (low HDL, mild hyperTG, mildly elevated LDL). Labs confirm noncompliance with meds for more than just a few days. Plan high dose atorvastatin + ezetimibe (when he was taking those earlier this year, LDL ws 58). 4. HTN: no fringe benefit from amlodipine. Push up the entresto and metoprolol doses. 5. NSVT/PVCs: on metoprolol. Consider LifeVest. Reevaluate LVEF at 90 days after revascularization and restarting meds. He meets primary prevention ICD implantation for ischemic cardiomyopathy (prior  myocardial infarction, left ventricular ejection fraction under 35%, heart failure NYHA class II, once on comprehensive medical therapy). 6. CKD 3a: his baseline creatinine is usually around 1.3 (GFR 55), but better today.   For questions or updates, please contact [place] Please consult [email or web address] for contact info under        Signed, Sanda Klein, MD  05/04/2020, 11:59 AM

## 2020-05-04 NOTE — Progress Notes (Signed)
Femoral arterial sheath pulled at 1000. Manual Pressure held by Tonye Pearson RN for 20 minutes. Vital signs remained stable throughout procedure. Pulses palpable during and after procedure in all extremities. Patient educated on post removal instructions. Site level 0 upon completion.

## 2020-05-05 ENCOUNTER — Other Ambulatory Visit: Payer: Self-pay

## 2020-05-05 DIAGNOSIS — I472 Ventricular tachycardia: Secondary | ICD-10-CM | POA: Diagnosis not present

## 2020-05-05 DIAGNOSIS — I5022 Chronic systolic (congestive) heart failure: Secondary | ICD-10-CM | POA: Diagnosis not present

## 2020-05-05 DIAGNOSIS — I2119 ST elevation (STEMI) myocardial infarction involving other coronary artery of inferior wall: Secondary | ICD-10-CM

## 2020-05-05 DIAGNOSIS — Z951 Presence of aortocoronary bypass graft: Secondary | ICD-10-CM | POA: Diagnosis not present

## 2020-05-05 LAB — HEMOGLOBIN A1C
Hgb A1c MFr Bld: 6.1 % — ABNORMAL HIGH (ref 4.8–5.6)
Mean Plasma Glucose: 128 mg/dL

## 2020-05-05 LAB — ECHOCARDIOGRAM COMPLETE
Area-P 1/2: 2.73 cm2
Height: 67 in
P 1/2 time: 463 msec
S' Lateral: 4.9 cm
Weight: 3280.44 oz

## 2020-05-05 MED ORDER — METOPROLOL SUCCINATE ER 50 MG PO TB24
75.0000 mg | ORAL_TABLET | Freq: Every day | ORAL | Status: DC
  Administered 2020-05-05 – 2020-05-06 (×2): 75 mg via ORAL
  Filled 2020-05-05 (×2): qty 1

## 2020-05-05 MED ORDER — MELATONIN 5 MG PO TABS
5.0000 mg | ORAL_TABLET | Freq: Every day | ORAL | Status: DC
  Administered 2020-05-05: 5 mg via ORAL
  Filled 2020-05-05: qty 1

## 2020-05-05 NOTE — Progress Notes (Addendum)
Progress Note  Patient Name: [name] Date of Encounter: 05/05/2020  [facility] Cardiologist: Sanda Klein, MD   Subjective   Feels better. No angina or dyspnea. No groin issues. Incessant monomorphic PVCs. NSVT episodes persist, but are shorter (3-4 beats only). He is completely unaware of the arrhythmia.   Inpatient Medications    Scheduled Meds: . amLODipine  5 mg Oral Daily  . aspirin  81 mg Oral Daily  . atorvastatin  80 mg Oral Daily  . Chlorhexidine Gluconate Cloth  6 each Topical Daily  . clopidogrel  75 mg Oral Daily  . ezetimibe  10 mg Oral Daily  . heparin  5,000 Units Subcutaneous Q8H  . influenza vaccine adjuvanted  0.5 mL Intramuscular Tomorrow-1000  . metoprolol succinate  50 mg Oral Daily  . sacubitril-valsartan  1 tablet Oral BID  . sodium chloride flush  3 mL Intravenous Q12H   Continuous Infusions: . sodium chloride    . nitroGLYCERIN Stopped (05/04/20 1818)   PRN Meds: sodium chloride, acetaminophen, ondansetron (ZOFRAN) IV, oxyCODONE, sodium chloride flush   Vital Signs    Vitals:   05/05/20 0430 05/05/20 0500 05/05/20 0530 05/05/20 0738  BP: 120/90 104/72 104/83   Pulse: (!) 59 (!) 59 (!) 48   Resp: (!) 23 (!) 25 12   Temp:    98.5 F (36.9 C)  TempSrc:    Oral  SpO2: 95% 91% 96%   Weight:      Height:        Intake/Output Summary (Last 24 hours) at 05/05/2020 0813 Last data filed at 05/05/2020 0200 Gross per 24 hour  Intake 175.17 ml  Output 975 ml  Net -799.83 ml   Last 3 Weights 05/03/2020 06/25/2019 05/06/2019  Weight (lbs) 205 lb 0.4 oz 205 lb 203 lb 6.4 oz  Weight (kg) 93 kg 92.987 kg 92.262 kg      Telemetry    SR w incessant, frequent PVCs, often bigeminy and occ brief NSVT - Personally Reviewed  ECG    No new tracing - Personally Reviewed  Physical Exam  Comfortable lying flat GEN: No acute distress.   Neck: No JVD Cardiac: incessant ectopy, no murmurs, rubs, or gallops.  Respiratory: Clear to  auscultation bilaterally. GI: Soft, nontender, non-distended  MS: No edema; No deformity. Neuro:  Nonfocal  Psych: Normal affect   Labs    High Sensitivity Troponin:   Recent Labs  Lab 05/03/20 2330 05/04/20 0536  TROPONINIHS 30* 26,486*      Chemistry Recent Labs  Lab 05/03/20 2321 05/03/20 2330 05/04/20 0536  NA 135 135 137  K 3.1* 3.3* 4.2  CL 101 103 107  CO2  --  18* 21*  GLUCOSE 153* 156* 121*  BUN 20 16 15   CREATININE 1.20 1.45* 1.15  CALCIUM  --  8.7* 8.3*  PROT  --  7.2  --   ALBUMIN  --  3.8  --   AST  --  25  --   ALT  --  19  --   ALKPHOS  --  65  --   BILITOT  --  0.8  --   GFRNONAA  --  50* >60  ANIONGAP  --  14 9     Hematology Recent Labs  Lab 05/03/20 2321 05/03/20 2330 05/04/20 0536  WBC  --  14.4* 11.9*  RBC  --  4.94 4.56  HGB 15.6 15.3 14.3  HCT 46.0 45.1 42.4  MCV  --  91.3 93.0  MCH  --  31.0 31.4  MCHC  --  33.9 33.7  RDW  --  13.4 13.4  PLT  --  316 278    BNP Recent Labs  Lab 05/03/20 2330 05/04/20 0536  BNP 1,169.8* 1,408.4*     DDimer No results for input(s): DDIMER in the last 168 hours.   Radiology    CARDIAC CATHETERIZATION  Result Date: 05/04/2020  Acute inferior STEMI related to thrombotic occlusion of the saphenous vein graft to the right coronary, with probable late stent thrombosis after missing Brilinta for 3 days.  Successful angioplasty with reperfusion of the inferior wall reducing the 100% stenosis in the distal bypass graft and native vessel anastomosis, using balloon angioplasty reducing the stenosis to 50% with TIMI grade III flow.  Diffuse proximal to mid 70% stenosis in the vein graft to the obtuse marginal  Patent LIMA to LAD  Totally occluded proximal RCA  Totally occluded proximal to mid circumflex  Totally occluded mid LAD.  Ostial 60% first diagonal.  Widely patent left main  LVEDP was not obtained as the patient developed ventricular tachycardia upon entry into the LV with the Judkins  right catheter. RECOMMENDATIONS:  IV nitroglycerin for blood pressure, improvement in microcirculatory flow, and to reduce LV end-diastolic pressures.  Aspirin and Plavix should be continued indefinitely.  Consider coronary bypass grafting of the to degenerated vein conduits as noted above.  Risk factor modification   Cardiac Studies   ECHO 05/04/2020  1. Since the last study on 06/23/2019 LVEF has decreased from 35-40% to  25-30%, there is a suspicion for left ventricular apical thrombus  measuring 14 x 6 mm.  2. Left ventricular ejection fraction, by estimation, is 25 to 30%. The  left ventricle has severely decreased function. The left ventricle  demonstrates global hypokinesis. The left ventricular internal cavity size  was moderately dilated. Left  ventricular diastolic parameters are consistent with Grade I diastolic  dysfunction (impaired relaxation). Elevated left atrial pressure.  3. Right ventricular systolic function is moderately reduced. The right  ventricular size is moderately enlarged. There is mildly elevated  pulmonary artery systolic pressure. The estimated right ventricular  systolic pressure is 25.0 mmHg.  4. Left atrial size was moderately dilated.  5. Right atrial size was moderately dilated.  6. The mitral valve is normal in structure. Mild mitral valve  regurgitation. No evidence of mitral stenosis.  7. The aortic valve is normal in structure. Aortic valve regurgitation is  mild. No aortic stenosis is present.  8. The inferior vena cava is normal in size with greater than 50%  respiratory variability, suggesting right atrial pressure of 3 mmHg.   Patient Profile     64 y.o. male with extensive CAD s/p CABG 2014 (after presentation with STEMI LCX territory) and recurrent inferior STEMI (SVG-RCA occlusion in Sept 2020), presents with another inferolateral STEMI and reocclusion of SVG-RCA graft, early deterioration of other SVG-OM2, with severely  depressed LV function and history of frequent NSVT.  Medical problems complicated by imperfect compliance with meds and f/u due to financial issues.   Assessment & Plan      1. CAD s/p inf STEMI: DAPT with ASA+ clopidogrel (would continue indefinitely due to complexity of CAD). Accelerated graft failure.  Re-evaluate after a few months to see if redo CABG with arterial grafts may be an option  (he did not receive a new stent and could interrupt clopidogrel relatively early).  Need aggressive risk factor modification. 2. CHF: normal filling  pressures at this time. Try to get back on Entresto (patient assistance program?), beta blocker. Previous attempts to use RAAS inhibitors led to hyperkalemia, watch K closely. Don't give spironolactone. Normal K and creatinine yesterday. 3. HLP: Has severe dyslipidemia (low HDL, mild hyperTG, mildly elevated LDL). Labs confirm noncompliance with meds for more than just a few days. Plan high dose atorvastatin + ezetimibe (when he was taking those earlier this year, LDL ws 58). 4. HTN: no fringe benefit from amlodipine, stopped it. Push up the entresto and metoprolol doses. 5. NSVT/PVCs: increase metoprolol. Recommend LifeVest. Reevaluate LVEF at 90 days after revascularization and restarting meds. He meets primary prevention ICD implantation for ischemic cardiomyopathy (prior myocardial infarction, left ventricular ejection fraction under 35%, heart failure NYHA class II, once on comprehensive medical therapy). 6. CKD 3a: his baseline creatinine is usually around 1.3 (GFR 55), but better yesterday at 1.15.   The echocardiogram performed yesterday was officially read today and raised concern about a possible apical thrombus. I have reviewed the images again and disagree with that finding. The echodensity in the apical area is surrounded by Definity contrast and does not have independent mobility. It is located in an area with good myocardial function. I believe it is a  trabeculation/false tendon, not a thrombus. I also disagree with the statement re: "elevated left atrial pressure"". The mitral inflow pattern of impaired relaxation with low LVEF is highly specific for low/normal mean LA pressure. The E/e' ratio is not elevated. This correlates with his clinical status, with no signs or symptoms of hypervolemia.  Transfer to floor. Possible DC tomorrow.  For questions or updates, please contact [place] Please consult [email or web address] for contact info under        Signed, Sanda Klein, MD  05/05/2020, 8:13 AM

## 2020-05-05 NOTE — TOC Benefit Eligibility Note (Signed)
Transition of Care [facility]) Benefit Eligibility Note    Patient Details  Name: Pravin Perezperez MRN: [id number] Date of Birth: 02-Aug-1955   Medication/Dose: ENTRESTO   24 TO 26 MG BID  Covered?: Yes  Tier: 3 Drug  Prescription Coverage [facility]erred [facility]: [facility]  and   [facility]  Spoke with Person/Company/Phone Number:: MIKE  @ [employer]  [employer]  # [phone]  Co-Pay: $142.27  Prior Approval: Yes [phone])  Deductible: Unmet (OUT-OF-POCKET:UNMET)       Memory Argue Phone Number: 05/05/2020, 4:47 PM

## 2020-05-05 NOTE — Progress Notes (Signed)
Patient is sitting up in the chair since transfer from [place] with no distress, walking to and from bathroom. No complaints at this time

## 2020-05-05 NOTE — Progress Notes (Addendum)
Patient admitted with STEMI, EF found to be decreased at 25-30%. Also having NSVT/PVCs. LifeVest ordered per Dr. Sallyanne Kuster request.   [name] Hakala [name], PA-C

## 2020-05-05 NOTE — TOC Progression Note (Addendum)
Transition of Care [facility]) - Progression Note    Patient Details  Name: [name] MRN: [id number] Date of Birth: 24-Feb-1956  Transition of Care [facility]) CM/SW Contact  [name], RN Phone Number: 05/05/2020, 3:51 PM  Clinical Narrative:    NCM spoke with patient, Dorian Pod with [employer] is working on life vest to get this approved. Patient states he has been having issues with the entresto cost, he states he has never used the 30 day free coupon for entresto, NCM gave him a 30 day free coupon to see if he would even be able to use it. Informed him if he used it before it will not go thru.  NCM gave him Good RX card also.        Expected Discharge Plan and Services                                                 Social Determinants of Health (SDOH) Interventions    Readmission Risk Interventions No flowsheet data found.

## 2020-05-05 NOTE — Plan of Care (Signed)
  Problem: Education: Goal: Knowledge of General Education information will improve Description: Including pain rating scale, medication(s)/side effects and non-pharmacologic comfort measures Outcome: Progressing   Problem: Health Behavior/Discharge Planning: Goal: Ability to manage health-related needs will improve Outcome: Progressing   Problem: Clinical Measurements: Goal: Ability to maintain clinical measurements within normal limits will improve Outcome: Progressing Goal: Will remain free from infection Outcome: Progressing Goal: Cardiovascular complication will be avoided Outcome: Progressing   Problem: Activity: Goal: Risk for activity intolerance will decrease Outcome: Progressing   Problem: Elimination: Goal: Will not experience complications related to bowel motility Outcome: Progressing Goal: Will not experience complications related to urinary retention Outcome: Completed/Met

## 2020-05-05 NOTE — Progress Notes (Signed)
CARDIAC REHAB PHASE I   PRE:  Rate/Rhythm: 73 bigemny  BP:  Sitting: 109/57      SaO2: 96 RA  MODE:  Ambulation: 370 ft   POST:  Rate/Rhythm: 94 bigemny  BP:  Sitting: 129/83    SaO2: 96 RA   Pt ambulated 351ft in hallway independently with steady gait. Pt denies CP, SOB, or dizziness. MI education completed with pt. Pt educated on importance of ASA, Plavix, statin, and NTG. Pt given MI book, CHF booklet and heart healthy diet. Reviewed site care, restrictions, and exercise guidelines. Reviewed purpose of LifeVest with pt. Will refer to CRP II [place] Rufina Falco, RN BSN 05/05/2020 9:17 AM

## 2020-05-06 ENCOUNTER — Encounter (HOSPITAL_COMMUNITY): Payer: Self-pay | Admitting: Interventional Cardiology

## 2020-05-06 DIAGNOSIS — I5042 Chronic combined systolic (congestive) and diastolic (congestive) heart failure: Secondary | ICD-10-CM | POA: Diagnosis not present

## 2020-05-06 DIAGNOSIS — I2119 ST elevation (STEMI) myocardial infarction involving other coronary artery of inferior wall: Secondary | ICD-10-CM | POA: Diagnosis not present

## 2020-05-06 DIAGNOSIS — I10 Essential (primary) hypertension: Secondary | ICD-10-CM

## 2020-05-06 DIAGNOSIS — Z951 Presence of aortocoronary bypass graft: Secondary | ICD-10-CM | POA: Diagnosis not present

## 2020-05-06 DIAGNOSIS — I493 Ventricular premature depolarization: Secondary | ICD-10-CM

## 2020-05-06 LAB — BASIC METABOLIC PANEL
Anion gap: 7 (ref 5–15)
BUN: 15 mg/dL (ref 8–23)
CO2: 26 mmol/L (ref 22–32)
Calcium: 8.7 mg/dL — ABNORMAL LOW (ref 8.9–10.3)
Chloride: 106 mmol/L (ref 98–111)
Creatinine, Ser: 1.38 mg/dL — ABNORMAL HIGH (ref 0.61–1.24)
Glucose, Bld: 100 mg/dL — ABNORMAL HIGH (ref 70–99)
Potassium: 4.2 mmol/L (ref 3.5–5.1)
Sodium: 139 mmol/L (ref 135–145)

## 2020-05-06 MED ORDER — NITROGLYCERIN 0.4 MG SL SUBL: 0.4000 mg | SUBLINGUAL_TABLET | SUBLINGUAL | 2 refills | Status: DC | PRN

## 2020-05-06 MED ORDER — CLOPIDOGREL BISULFATE 75 MG PO TABS: 75.0000 mg | ORAL_TABLET | Freq: Every day | ORAL | 3 refills | Status: DC

## 2020-05-06 MED ORDER — ENTRESTO 24-26 MG PO TABS: 1.0000 | ORAL_TABLET | Freq: Two times a day (BID) | ORAL | 0 refills | Status: DC

## 2020-05-06 MED ORDER — METOPROLOL SUCCINATE ER 50 MG PO TB24: 75.0000 mg | ORAL_TABLET | Freq: Every day | ORAL | 1 refills | Status: DC

## 2020-05-06 MED ORDER — ENTRESTO 24-26 MG PO TABS: 1.0000 | ORAL_TABLET | Freq: Two times a day (BID) | ORAL | 5 refills | Status: DC

## 2020-05-06 NOTE — Progress Notes (Signed)
Heart Failure Stewardship Patient Advocate Encounter  Began Manufacturer's Assistance application to [employer] for [employer].   Patient has completed his portion. Pending completion of prescriber portion for the application. Will fax completed application to [employer].  Phone:[phone] Fax: [phone]  Kerby Nora, PharmD, BCPS Heart Failure Stewardship Pharmacist Phone [phone] 05/06/2020       9:01 AM  Please check [email or web address] for unit-specific pharmacist phone numbers

## 2020-05-06 NOTE — Progress Notes (Signed)
Progress Note  Patient Name: [name] Date of Encounter: 05/06/2020  [facility] Cardiologist: Sanda Klein, MD   Subjective   Feels well.  Ambulated without chest pain or shortness of breath.   Inpatient Medications    Scheduled Meds: . aspirin  81 mg Oral Daily  . atorvastatin  80 mg Oral Daily  . Chlorhexidine Gluconate Cloth  6 each Topical Daily  . clopidogrel  75 mg Oral Daily  . ezetimibe  10 mg Oral Daily  . heparin  5,000 Units Subcutaneous Q8H  . melatonin  5 mg Oral QHS  . metoprolol succinate  75 mg Oral Daily  . sacubitril-valsartan  1 tablet Oral BID  . sodium chloride flush  3 mL Intravenous Q12H   Continuous Infusions: . sodium chloride    . nitroGLYCERIN Stopped (05/04/20 1818)   PRN Meds: sodium chloride, acetaminophen, ondansetron (ZOFRAN) IV, oxyCODONE, sodium chloride flush   Vital Signs    Vitals:   05/05/20 2002 05/06/20 0039 05/06/20 0451 05/06/20 0700  BP: 108/84 112/74 113/85 136/86  Pulse: 72 67 66 70  Resp: 18 16 15 15   Temp: 98.5 F (36.9 C) 98.5 F (36.9 C) 98.1 F (36.7 C) 99 F (37.2 C)  TempSrc: Oral Oral Oral Oral  SpO2: 96% 90% 96% 98%  Weight:  89.9 kg    Height:        Intake/Output Summary (Last 24 hours) at 05/06/2020 1038 Last data filed at 05/05/2020 1930 Gross per 24 hour  Intake 506.78 ml  Output 200 ml  Net 306.78 ml   Last 3 Weights 05/06/2020 05/03/2020 06/25/2019  Weight (lbs) 198 lb 4.8 oz 205 lb 0.4 oz 205 lb  Weight (kg) 89.948 kg 93 kg 92.987 kg      Telemetry    Sinus rhythm.  Frequent PVCs. - Personally Reviewed  ECG    05/04/2020: Sinus rhythm.  Rate 78 bpm.  Ventricular bigeminy.  Prior inferior infarct.  QTc 528 ms. - Personally Reviewed  Physical Exam   VS:  BP 136/86 (BP Location: Right Arm)   Pulse 70   Temp 99 F (37.2 C) (Oral)   Resp 15   Ht 5\' 7"  (1.702 m)   Wt 89.9 kg   SpO2 98%   BMI 31.06 kg/m  , BMI Body mass index is 31.06 kg/m. GENERAL:  Well  appearing HEENT: Pupils equal round and reactive, fundi not visualized, oral mucosa unremarkable NECK:  No jugular venous distention, waveform within normal limits, carotid upstroke brisk and symmetric, no bruits LUNGS:  Clear to auscultation bilaterally HEART:  Irregularly irregular.  PMI not displaced or sustained,S1 and S2 within normal limits, no S3, no S4, no clicks, no rubs,  murmurs ABD:  Flat, positive bowel sounds normal in frequency in pitch, no bruits, no rebound, no guarding, no midline pulsatile mass, no hepatomegaly, no splenomegaly EXT:  2 plus pulses throughout, no edema, no cyanosis no clubbing SKIN:  No rashes no nodules NEURO:  Cranial nerves II through XII grossly intact, motor grossly intact throughout [facility]:  Cognitively intact, oriented to person place and time   Labs    High Sensitivity Troponin:   Recent Labs  Lab 05/03/20 2330 05/04/20 0536  TROPONINIHS 30* 26,486*      Chemistry Recent Labs  Lab 05/03/20 2330 05/04/20 0536 05/06/20 0354  NA 135 137 139  K 3.3* 4.2 4.2  CL 103 107 106  CO2 18* 21* 26  GLUCOSE 156* 121* 100*  BUN 16 15  15  CREATININE 1.45* 1.15 1.38*  CALCIUM 8.7* 8.3* 8.7*  PROT 7.2  --   --   ALBUMIN 3.8  --   --   AST 25  --   --   ALT 19  --   --   ALKPHOS 65  --   --   BILITOT 0.8  --   --   GFRNONAA 50* >60 NOT CALCULATED  ANIONGAP 14 9 7      Hematology Recent Labs  Lab 05/03/20 2321 05/03/20 2330 05/04/20 0536  WBC  --  14.4* 11.9*  RBC  --  4.94 4.56  HGB 15.6 15.3 14.3  HCT 46.0 45.1 42.4  MCV  --  91.3 93.0  MCH  --  31.0 31.4  MCHC  --  33.9 33.7  RDW  --  13.4 13.4  PLT  --  316 278    BNP Recent Labs  Lab 05/03/20 2330 05/04/20 0536  BNP 1,169.8* 1,408.4*     DDimer No results for input(s): DDIMER in the last 168 hours.   Radiology    No results found.  Cardiac Studies   Echo 05/04/20: 1. Since the last study on 06/23/2019 LVEF has decreased from 35-40% to  25-30%, there is a  suspicion for left ventricular apical thrombus  measuring 14 x 6 mm.  2. Left ventricular ejection fraction, by estimation, is 25 to 30%. The  left ventricle has severely decreased function. The left ventricle  demonstrates global hypokinesis. The left ventricular internal cavity size  was moderately dilated. Left  ventricular diastolic parameters are consistent with Grade I diastolic  dysfunction (impaired relaxation). Elevated left atrial pressure.  3. Right ventricular systolic function is moderately reduced. The right  ventricular size is moderately enlarged. There is mildly elevated  pulmonary artery systolic pressure. The estimated right ventricular  systolic pressure is 40.9 mmHg.  4. Left atrial size was moderately dilated.  5. Right atrial size was moderately dilated.  6. The mitral valve is normal in structure. Mild mitral valve  regurgitation. No evidence of mitral stenosis.  7. The aortic valve is normal in structure. Aortic valve regurgitation is  mild. No aortic stenosis is present.  8. The inferior vena cava is normal in size with greater than 50%  respiratory variability, suggesting right atrial pressure of 3 mmHg.   LHC 05/03/20:  Acute inferior STEMI related to thrombotic occlusion of the saphenous vein graft to the right coronary, with probable late stent thrombosis after missing Brilinta for 3 days.  Successful angioplasty with reperfusion of the inferior wall reducing the 100% stenosis in the distal bypass graft and native vessel anastomosis, using balloon angioplasty reducing the stenosis to 50% with TIMI grade III flow.  Diffuse proximal to mid 70% stenosis in the vein graft to the obtuse marginal  Patent LIMA to LAD  Totally occluded proximal RCA  Totally occluded proximal to mid circumflex  Totally occluded mid LAD.  Ostial 60% first diagonal.  Widely patent left main  LVEDP was not obtained as the patient developed ventricular tachycardia upon  entry into the LV with the Judkins right catheter.  RECOMMENDATIONS:   IV nitroglycerin for blood pressure, improvement in microcirculatory flow, and to reduce LV end-diastolic pressures.  Aspirin and Plavix should be continued indefinitely.  Consider coronary bypass grafting of the to degenerated vein conduits as noted above.  Risk factor modification  Diagnostic Dominance: Right  Intervention     Patient Profile     64 y.o. male with  CAD status post CABG in 2014 (after presentation with STEMI LCX territory) and recurrent inferior STEMI (SVG-RCA occlusion in Sept 2020), presents with another inferolateral STEMI and reocclusion of SVG-RCA graft, early deterioration of other SVG-OM2, chronic systolic and diastolic heart failure, NSVT, hypertension, hyperlipidemia, and CKD 3 admitted with inferior STEMI  Assessment & Plan    # CAD s/p CABG and recurrent STEMI: He had in-stent thrombosis of his distal RCA stent.  He underwent successful POBA on 10/21.  He will need to be on aspirin and clopidogrel indefinitely given his complex CAD history.  There was a plan to reevaluate in a few months to see if he may need a redo CABG with arterial grafts.  No stent was placed this admission so dual antiplatelet therapy could be interrupted for surgery if indicated.  LDL was 114 on admission.  He has been on atorvastatin and Zetia as an outpatient.  Would switch Zetia to PCSK9 inhibitor as an outpatient.  # Chronic systolic and diastolic heart failure:  Blood pressures were normal on This admission.  He has been working to get back on Christopher, hopefully through the patient assistance program.  He has been unable to be on spironolactone due to hyperkalemia.  Home amlodipine has been held.  Continue metoprolol and Delene Loll has been restarted.  Life Vest ordered and he has been fitted.   # CKD III: Recommend starting Wilder Glade given his systolic dysfunction and CKD.  Given the difficulty with getting  Entresto approved, this can be deferred to the outpatient setting.   For questions or updates, please contact [place] Please consult [email or web address] for contact info under        Signed, Skeet Latch, MD  05/06/2020, 10:38 AM

## 2020-05-06 NOTE — Discharge Summary (Signed)
Discharge Summary    Patient ID: Burdell Peed MRN: [id number]; DOB: [birth year]  Admit date: 05/03/2020 Discharge date: 05/06/2020  Primary Care Provider: [name], MD  Primary Cardiologist: Sanda Klein, MD  Discharge Diagnoses    Principal Problem:   ST elevation myocardial infarction (STEMI) [facility]) Active Problems:   Essential hypertension   Borderline diabetic   Hx of CABG   Ischemic cardiomyopathy   ST elevation myocardial infarction (STEMI) of inferior wall [facility])   Diagnostic Studies/Procedures     Cardiac Catheterization: 05/03/2020  Acute inferior STEMI related to thrombotic occlusion of the saphenous vein graft to the right coronary, with probable late stent thrombosis after missing Brilinta for 3 days.  Successful angioplasty with reperfusion of the inferior wall reducing the 100% stenosis in the distal bypass graft and native vessel anastomosis, using balloon angioplasty reducing the stenosis to 50% with TIMI grade III flow.  Diffuse proximal to mid 70% stenosis in the vein graft to the obtuse marginal  Patent LIMA to LAD  Totally occluded proximal RCA  Totally occluded proximal to mid circumflex  Totally occluded mid LAD.  Ostial 60% first diagonal.  Widely patent left main  LVEDP was not obtained as the patient developed ventricular tachycardia upon entry into the LV with the Judkins right catheter.  RECOMMENDATIONS:   IV nitroglycerin for blood pressure, improvement in microcirculatory flow, and to reduce LV end-diastolic pressures.  Aspirin and Plavix should be continued indefinitely.  Consider coronary bypass grafting of the to degenerated vein conduits as noted above.  Risk factor modification    Echocardiogram: 05/04/2020 IMPRESSIONS    1. Since the last study on 06/23/2019 LVEF has decreased from 35-40% to  25-30%, there is a suspicion for left ventricular apical thrombus  measuring 14 x 6 mm.  2. Left ventricular  ejection fraction, by estimation, is 25 to 30%. The  left ventricle has severely decreased function. The left ventricle  demonstrates global hypokinesis. The left ventricular internal cavity size  was moderately dilated. Left  ventricular diastolic parameters are consistent with Grade I diastolic  dysfunction (impaired relaxation). Elevated left atrial pressure.  3. Right ventricular systolic function is moderately reduced. The right  ventricular size is moderately enlarged. There is mildly elevated  pulmonary artery systolic pressure. The estimated right ventricular  systolic pressure is 37.1 mmHg.  4. Left atrial size was moderately dilated.  5. Right atrial size was moderately dilated.  6. The mitral valve is normal in structure. Mild mitral valve  regurgitation. No evidence of mitral stenosis.  7. The aortic valve is normal in structure. Aortic valve regurgitation is  mild. No aortic stenosis is present.  8. The inferior vena cava is normal in size with greater than 50%  respiratory variability, suggesting right atrial pressure of 3 mmHg.   History of Present Illness     Chrystian Cupples is a 64 y.o. male with PMH of CAD (s/p CABG in 2014 with LIMA-LAD, SVG-OM2 and SVG-RCA, s/p angioplasty to proximal SVG-RCA and DES to SVG-RCA anastomosis in 03/2019), chronic combined systolic and diastolic CHF/ICM (EF 06-26% by echo in 06/2019), HTN, HLD and prior CVA who presented to [facility] ED on 05/03/2020 as a CODE STEMI.   He reported sudden chest pain starting the evening of admission and EKG by EMS showed ST elevation along the inferior leads and he was taken for emergent cardiac catheterization. Reported having been without his medications for at least 3 days.   Hospital Course     Consultants:  None  His cath was performed by Dr. Tamala Julian and showed thrombotic occlusion of the saphenous vein graft to the right coronary, with probable late stent thrombosis after missing Brilinta for 3  days. Underwent successful angioplasty with reperfusion of the inferior wall reducing the 100% stenosis in the distal bypass graft and native vessel anastomosis reducing the stenosis to 50% with TIMI grade III flow. He did have diffuse proximal to mid 70% stenosis in the vein graft to the obtuse marginal and LIMA-LAD was patent. Was recommended to continue ASA and Plavix indefinitely.   The following morning, he denied any recurrent anginal symptoms. Was still on IV Aggrastat and IV NTG (due to elevated BP), therefore these were discontinued and he was restarted on oral medications. He continued to have brief episodes of NSVT (lasting for 3-4 beats). Repeat echo showed his EF was reduced to 25-30%. The report mentioned concern for left ventricular apical thrombus but this was reviewed by Dr. Sallyanne Kuster and felt to be most consistent with trabeculation/false tendon, not a thrombus. It was recommended he have a LifeVest at the time of hospital discharge with plans for reevaluation of LVEF 90 days after revascularization. LifeVest was placed on 05/06/2020. He was discharged on Toprol-XL 75mg  daily and Entresto 24-26mg  BID for his cardiomyopathy. Not placed on Spironolactone given issues with recurrent hyperkalemia in the past. Was recommended to consider starting Farxiga as an outpatient if approved for patient assistance.   He was last examined by Dr. Oval Linsey and deemed stable for discharge. A staff message has been sent to the office to arrange for a 7-10 day TOC appointment.    Did the patient have an acute coronary syndrome (MI, NSTEMI, STEMI, etc) this admission?:  Yes                               AHA/ACC Clinical Performance & Quality Measures: 5. Aspirin prescribed? - Yes 6. ADP Receptor Inhibitor (Plavix/Clopidogrel, Brilinta/Ticagrelor or Effient/Prasugrel) prescribed (includes medically managed patients)? - Yes 7. Beta Blocker prescribed? - Yes 8. High Intensity Statin (Lipitor 40-80mg  or Crestor  20-40mg ) prescribed? - Yes 9. EF assessed during THIS hospitalization? - Yes 10. For EF <40%, was ACEI/ARB prescribed? - Yes - On Entresto 11. For EF <40%, Aldosterone Antagonist (Spironolactone or Eplerenone) prescribed? - No - Reason:  Issues with recurrent hyperkalemia 12. Cardiac Rehab Phase II ordered (including medically managed patients)? - Yes   _____________  Discharge Vitals Blood pressure 136/86, pulse 70, temperature 99 F (37.2 C), temperature source Oral, resp. rate 15, height 5\' 7"  (1.702 m), weight 89.9 kg, SpO2 98 %.  Filed Weights   05/03/20 2306 05/06/20 0039  Weight: 93 kg 89.9 kg    Labs & Radiologic Studies    CBC Recent Labs    05/03/20 2330 05/04/20 0536  WBC 14.4* 11.9*  NEUTROABS 8.6*  --   HGB 15.3 14.3  HCT 45.1 42.4  MCV 91.3 93.0  PLT 316 102   Basic Metabolic Panel Recent Labs    05/04/20 0536 05/06/20 0354  NA 137 139  K 4.2 4.2  CL 107 106  CO2 21* 26  GLUCOSE 121* 100*  BUN 15 15  CREATININE 1.15 1.38*  CALCIUM 8.3* 8.7*   Liver Function Tests Recent Labs    05/03/20 2330  AST 25  ALT 19  ALKPHOS 65  BILITOT 0.8  PROT 7.2  ALBUMIN 3.8   No results for input(s): LIPASE, AMYLASE  in the last 72 hours. High Sensitivity Troponin:   Recent Labs  Lab 05/03/20 2330 05/04/20 0536  TROPONINIHS 30* 26,486*    BNP Invalid input(s): POCBNP D-Dimer No results for input(s): DDIMER in the last 72 hours. Hemoglobin A1C Recent Labs    05/03/20 2330  HGBA1C 6.1*   Fasting Lipid Panel Recent Labs    05/04/20 0536  CHOL 174  HDL 29*  LDLCALC 114*  TRIG 153*  CHOLHDL 6.0   Thyroid Function Tests No results for input(s): TSH, T4TOTAL, T3FREE, THYROIDAB in the last 72 hours.  Invalid input(s): FREET3 ________  Disposition   Pt is being discharged home today in good condition.  Follow-up Plans & Appointments     Follow-up Information    Croitoru, Mihai, MD Follow up on 05/26/2020.   Specialty: Cardiology Why:  Keep scheduled follow-up with Dr. Sallyanne Kuster on 05/26/2020 at 8:20 AM. A message has been sent to the office to see if closer follow-up can be arranged.  Contact information: [address] [place] [address] [phone]              Discharge Instructions    Diet - low sodium heart healthy   Complete by: As directed    Discharge instructions   Complete by: As directed    PLEASE REMEMBER TO BRING ALL OF YOUR MEDICATIONS TO EACH OF YOUR FOLLOW-UP OFFICE VISITS.  PLEASE ATTEND ALL SCHEDULED FOLLOW-UP APPOINTMENTS.   Activity: Increase activity slowly as tolerated. You may shower, but no soaking baths (or swimming) for 1 week. You may not return to work until cleared by your cardiologist. No lifting over 10 lbs for 4 weeks. No sexual activity for 4 weeks.   Wound Care: You may wash cath site gently with soap and water. Keep cath site clean and dry. If you notice pain, swelling, bleeding or pus at your cath site, please call [phone].   Increase activity slowly   Complete by: As directed    Remove dressing in 24 hours   Complete by: As directed       Discharge Medications   Allergies as of 05/06/2020   No Known Allergies     Medication List    STOP taking these medications   amLODipine 5 MG tablet Commonly known as: NORVASC   ticagrelor 90 MG Tabs tablet Commonly known as: BRILINTA     TAKE these medications   aspirin 81 MG chewable tablet Chew 81 mg by mouth daily.   atorvastatin 80 MG tablet Commonly known as: LIPITOR TAKE 1 TABLET (80 MG TOTAL) BY MOUTH DAILY AT 6 PM.   clopidogrel 75 MG tablet Commonly known as: PLAVIX Take 1 tablet (75 mg total) by mouth daily. Start taking on: May 07, 2020   Entresto 24-26 MG Generic drug: sacubitril-valsartan Take 1 tablet by mouth 2 (two) times daily.   ezetimibe 10 MG tablet Commonly known as: ZETIA Take 1 tablet (10 mg total) by mouth daily.   Fish Oil 1000 MG Caps Take 1,000 mg by  mouth in the morning and at bedtime.   metoprolol succinate 50 MG 24 hr tablet Commonly known as: TOPROL-XL Take 1.5 tablets (75 mg total) by mouth daily. Take with or immediately following a meal. What changed: how much to take   multivitamin with minerals Tabs tablet Take 1 tablet by mouth daily.   nitroGLYCERIN 0.4 MG SL tablet Commonly known as: NITROSTAT Place 1 tablet (0.4 mg total) under the tongue every 5 (five) minutes x  3 doses as needed for chest pain.            Durable Medical Equipment  (From admission, onward)         Start     Ordered   05/05/20 1310  For home use only DME Vest life vest  Once        05/05/20 1309             Outstanding Labs/Studies   BMET at follow-up given that Delene Loll was restarted.   Duration of Discharge Encounter   Greater than 30 minutes including physician time.  Signed, Erma Heritage, PA-C 05/06/2020, 12:54 PM

## 2020-05-09 ENCOUNTER — Other Ambulatory Visit: Payer: Self-pay | Admitting: *Deleted

## 2020-05-09 MED ORDER — ENTRESTO 24-26 MG PO TABS: 1.0000 | ORAL_TABLET | Freq: Two times a day (BID) | ORAL | 5 refills | Status: DC

## 2020-05-09 MED ORDER — ENTRESTO 24-26 MG PO TABS
1.0000 | ORAL_TABLET | Freq: Two times a day (BID) | ORAL | 5 refills | Status: DC
Start: 2020-05-09 — End: 2020-05-09

## 2020-05-10 ENCOUNTER — Telehealth: Payer: Self-pay | Admitting: Cardiovascular Disease

## 2020-05-10 ENCOUNTER — Telehealth (HOSPITAL_COMMUNITY): Payer: Self-pay

## 2020-05-10 NOTE — Telephone Encounter (Signed)
Waiting for a cardiac rehab referral to be placed. Received white card but No referral. Sent message to Helene Kelp RN phase I nurse of cardiac rehab to place referral.

## 2020-05-10 NOTE — Telephone Encounter (Signed)
     megan from [facility] called, she said she faxed over a pt assistance form for Dr. Loletha Grayer to sign. She said she faxed it on 10/25, she would like to get a follow up if the form is signed by Dr. Loletha Grayer.

## 2020-05-11 NOTE — Telephone Encounter (Signed)
Routed to primary nurse re: patient assistance form

## 2020-05-11 NOTE — Telephone Encounter (Signed)
Assistance forms has been faxed. Megan made aware.

## 2020-05-11 NOTE — Telephone Encounter (Signed)
[facility] called in and stated she spoke with [employer] and have not rec'd the form for the patient assistance program.  She would like to check on the status    Best number [phone]

## 2020-05-15 ENCOUNTER — Other Ambulatory Visit: Payer: Self-pay | Admitting: *Deleted

## 2020-05-15 DIAGNOSIS — I5042 Chronic combined systolic (congestive) and diastolic (congestive) heart failure: Secondary | ICD-10-CM

## 2020-05-15 NOTE — Progress Notes (Signed)
Katrina from lab came to me about pt being on lab schedule for next week with no orders.  Pt recently hospitalized and was seen by Dr. Tamala Julian.  Note in discharge summary about getting a BMET since [place] was restarted but no order in system.  Order placed.

## 2020-05-16 NOTE — Telephone Encounter (Signed)
Called [employer] to check status. Patient has been approved.   Valid from 05/12/20-07/14/20  They will be contacting patient to set up delivery for first shipment of medication.  Called patient, no answer and mailbox is full so I am unable to leave a voicemail. I called [name] (spouse listed in chart) and was able to leave a message. Instructed to have him call me back to discuss next steps.

## 2020-05-17 ENCOUNTER — Telehealth (HOSPITAL_COMMUNITY): Payer: Self-pay

## 2020-05-17 NOTE — Telephone Encounter (Signed)
Pt returned call. Informed him about Entresto patient assistance approval. They will be contacting him shortly to set up initial delivery of medication.

## 2020-05-22 NOTE — Telephone Encounter (Signed)
Thanks, Barista. Starting the process at the hospital really expedited his approval. We can take it from here. The [name] will talk. :)

## 2020-05-23 ENCOUNTER — Other Ambulatory Visit: Payer: Self-pay

## 2020-05-23 ENCOUNTER — Other Ambulatory Visit: Payer: Medicare HMO

## 2020-05-23 DIAGNOSIS — I5042 Chronic combined systolic (congestive) and diastolic (congestive) heart failure: Secondary | ICD-10-CM

## 2020-05-23 LAB — BASIC METABOLIC PANEL
BUN/Creatinine Ratio: 15 (ref 10–24)
BUN: 20 mg/dL (ref 8–27)
CO2: 22 mmol/L (ref 20–29)
Calcium: 9.3 mg/dL (ref 8.6–10.2)
Chloride: 104 mmol/L (ref 96–106)
Creatinine, Ser: 1.3 mg/dL — ABNORMAL HIGH (ref 0.76–1.27)
GFR calc Af Amer: 66 mL/min/{1.73_m2} (ref >59)
GFR calc non Af Amer: 57 mL/min/{1.73_m2} — ABNORMAL LOW (ref >59)
Glucose: 98 mg/dL (ref 65–99)
Potassium: 5.1 mmol/L (ref 3.5–5.2)
Sodium: 139 mmol/L (ref 134–144)

## 2020-05-24 NOTE — Telephone Encounter (Signed)
Pt insurance is active and benefits verified through [facility]. Co-pay $10.00, DED $0.00/$0.00 met, out of pocket $3,900.00/$290.00 met, co-insurance 0%. No pre-authorization required. Passport, 05/24/20 @ 10:51AM, TAV#[id number]  Will contact patient to see if he is interested in the Cardiac Rehab Program. If interested, patient will need to complete follow up appt. Once completed, patient will be contacted for scheduling upon review by the RN Navigator.

## 2020-05-25 ENCOUNTER — Encounter: Payer: Self-pay | Admitting: *Deleted

## 2020-05-26 ENCOUNTER — Encounter: Payer: Self-pay | Admitting: Cardiovascular Disease

## 2020-05-26 ENCOUNTER — Ambulatory Visit: Payer: Medicare HMO | Admitting: Cardiovascular Disease

## 2020-05-26 ENCOUNTER — Ambulatory Visit (INDEPENDENT_AMBULATORY_CARE_PROVIDER_SITE_OTHER): Payer: Medicare HMO | Admitting: Cardiovascular Disease

## 2020-05-26 ENCOUNTER — Other Ambulatory Visit: Payer: Self-pay

## 2020-05-26 VITALS — BP 123/76 | HR 50 | Ht 67.0 in | Wt 206.0 lb

## 2020-05-26 DIAGNOSIS — I10 Essential (primary) hypertension: Secondary | ICD-10-CM

## 2020-05-26 DIAGNOSIS — I2119 ST elevation (STEMI) myocardial infarction involving other coronary artery of inferior wall: Secondary | ICD-10-CM | POA: Diagnosis not present

## 2020-05-26 DIAGNOSIS — R7303 Prediabetes: Secondary | ICD-10-CM

## 2020-05-26 DIAGNOSIS — E785 Hyperlipidemia, unspecified: Secondary | ICD-10-CM | POA: Diagnosis not present

## 2020-05-26 DIAGNOSIS — F419 Anxiety disorder, unspecified: Secondary | ICD-10-CM

## 2020-05-26 DIAGNOSIS — I4729 Other ventricular tachycardia: Secondary | ICD-10-CM | POA: Insufficient documentation

## 2020-05-26 DIAGNOSIS — I77810 Thoracic aortic ectasia: Secondary | ICD-10-CM | POA: Diagnosis not present

## 2020-05-26 DIAGNOSIS — I5042 Chronic combined systolic (congestive) and diastolic (congestive) heart failure: Secondary | ICD-10-CM | POA: Diagnosis not present

## 2020-05-26 DIAGNOSIS — I472 Ventricular tachycardia: Secondary | ICD-10-CM | POA: Diagnosis not present

## 2020-05-26 MED ORDER — ALPRAZOLAM 0.5 MG PO TABS: 0.5000 mg | ORAL_TABLET | Freq: Every evening | ORAL | 0 refills | Status: DC | PRN

## 2020-05-26 MED ORDER — SACUBITRIL-VALSARTAN 49-51 MG PO TABS: 1.0000 | ORAL_TABLET | Freq: Two times a day (BID) | ORAL | 1 refills | Status: DC

## 2020-05-26 MED ORDER — ESCITALOPRAM OXALATE 10 MG PO TABS: 10.0000 mg | ORAL_TABLET | Freq: Every day | ORAL | 11 refills | Status: DC

## 2020-05-26 NOTE — Progress Notes (Signed)
Cardiology Office Note:    Date:  05/26/2020   ID:  [name], DOB 1956/05/13, MRN [id number]  PCP:  [name], MD  Cardiologist:  Sanda Klein, MD  Electrophysiologist:  None   Referring MD: [name], MD   No chief complaint on file.   History of Present Illness:    [name] is a 64 y.o. male with a hx of ischemic stroke in 2012, multivessel CAD leading to bypass surgery in 2014 (LIMA to LAD, SVG-OM 2, SVG-RCA), who presented with acute myocardial infarction due to occlusion at the [facility] anastomosis on March 23, 2019, treated with angioplasty of the proximal SVG and drug-eluting stents to the SVG-RCA anastomosis), and returns with recurrent inferior wall acute ST segment elevation myocardial infarction due to thrombotic occlusion of the SVG-RCA stents on 05/03/2020.  This was treated with angioplasty without placement of new stents.  Left ventricular ejection fraction decreased from the previous baseline of 35-40% to only 25-30% following the last coronary event.  He was started on Entresto in addition to beta-blocker and a LifeVest was prescribed.  Interventional cardiologist raised concern for atherosclerotic deterioration of his vein graft and possible future need for redo CABG.  He had been noncompliant with his medications (Brilinta) due to cost,, but in fact have been well beyond 12 months from his previous intervention.  He is currently taking all of his medications including his aspirin and clopidogrel but he has not been wearing his LifeVest since he does not like the bulky battery pack.  He has been having a lot of issues with anxiety and short temper.  In the past used to take Lexapro.  He has not had any issues with angina or dyspnea either at rest or with activity, has not been troubled by palpitations, dizziness or syncope and does not have lower extremity edema, orthopnea or PND.  She has not had any falls injuries or bleeding problems.  In  the past, treatment with Delene Loll was associated with hyperkalemia.  Labs performed just a couple of days ago show potassium of 5.1.  He is not taking any diuretics.  He has a mild dilation of the aortic root at 4.2 cm and dyslipidemia with very low HDL cholesterol and essential hypertension.  He is prediabetic.   Past Medical History:  Diagnosis Date  . Borderline diabetic   . Coronary artery disease    a. s/p CABG in 2014 with LIMA-LAD, SVG-OM2 and SVG-RC b. 03/2019: STEMI and s/p angioplasty to proximal SVG-RCA and DES to SVG-RCA anastomosis c. 04/2020: recurrent STEMI with angioplasty of SVG-RCA  . Dyslipidemia 01/11/2013  . Hiatal hernia   . Hypertension   . Mini stroke ([place])    2  . S/P CABG x 3   . STEMI (ST elevation myocardial infarction) ([place])   . Stroke [facility])     Past Surgical History:  Procedure Laterality Date  . CARDIAC CATHETERIZATION    . CORONARY ARTERY BYPASS GRAFT N/A 09/01/2012   Procedure: CORONARY ARTERY BYPASS GRAFTING (CABG);  Surgeon: Gaye Pollack, MD;  Location: [place];  Service: Open Heart Surgery;  Laterality: N/A;  Coronary Artery Bypass Grafting Times Three Using Left Internal Mammary Artery and Left Saphenous leg Vein Harvested Endoscopically  . CORONARY STENT INTERVENTION N/A 03/23/2019   Procedure: CORONARY STENT INTERVENTION;  Surgeon: Lorretta Harp, MD;  Location: [place] CV LAB;  Service: Cardiovascular;  Laterality: N/A;  . CORONARY/GRAFT ACUTE MI REVASCULARIZATION N/A 03/23/2019   Procedure: Coronary/Graft Acute  MI Revascularization;  Surgeon: Lorretta Harp, MD;  Location: [place] CV LAB;  Service: Cardiovascular;  Laterality: N/A;  . CORONARY/GRAFT ACUTE MI REVASCULARIZATION N/A 05/03/2020   Procedure: Coronary/Graft Acute MI Revascularization;  Surgeon: Belva Crome, MD;  Location: [place] CV LAB;  Service: Cardiovascular;  Laterality: N/A;  . INTRAOPERATIVE TRANSESOPHAGEAL ECHOCARDIOGRAM N/A 09/01/2012   Procedure:  INTRAOPERATIVE TRANSESOPHAGEAL ECHOCARDIOGRAM;  Surgeon: Gaye Pollack, MD;  Location: [place] OR;  Service: Open Heart Surgery;  Laterality: N/A;  . LEFT HEART CATH AND CORONARY ANGIOGRAPHY N/A 03/23/2019   Procedure: LEFT HEART CATH AND CORONARY ANGIOGRAPHY;  Surgeon: Lorretta Harp, MD;  Location: [place] CV LAB;  Service: Cardiovascular;  Laterality: N/A;  . LEFT HEART CATH AND CORONARY ANGIOGRAPHY N/A 05/03/2020   Procedure: LEFT HEART CATH AND CORONARY ANGIOGRAPHY;  Surgeon: Belva Crome, MD;  Location: [place] CV LAB;  Service: Cardiovascular;  Laterality: N/A;  . LEFT HEART CATHETERIZATION WITH CORONARY ANGIOGRAM N/A 08/31/2012   Procedure: LEFT HEART CATHETERIZATION WITH CORONARY ANGIOGRAM;  Surgeon: Peter M Martinique, MD;  Location: [facility] CATH LAB;  Service: Cardiovascular;  Laterality: N/A;  . PERCUTANEOUS CORONARY STENT INTERVENTION (PCI-S) N/A 08/31/2012   Procedure: PERCUTANEOUS CORONARY STENT INTERVENTION (PCI-S);  Surgeon: Peter M Martinique, MD;  Location: [facility] CATH LAB;  Service: Cardiovascular;  Laterality: N/A;    Current Medications: Current Meds  Medication Sig  . aspirin 81 MG chewable tablet Chew 81 mg by mouth daily.  Marland Kitchen atorvastatin (LIPITOR) 80 MG tablet TAKE 1 TABLET (80 MG TOTAL) BY MOUTH DAILY AT 6 PM.  . clopidogrel (PLAVIX) 75 MG tablet Take 1 tablet (75 mg total) by mouth daily.  Marland Kitchen ezetimibe (ZETIA) 10 MG tablet Take 1 tablet (10 mg total) by mouth daily.  . metoprolol succinate (TOPROL-XL) 50 MG 24 hr tablet Take 1.5 tablets (75 mg total) by mouth daily. Take with or immediately following a meal.  . Multiple Vitamin (MULTIVITAMIN WITH MINERALS) TABS tablet Take 1 tablet by mouth daily.  . nitroGLYCERIN (NITROSTAT) 0.4 MG SL tablet Place 1 tablet (0.4 mg total) under the tongue every 5 (five) minutes x 3 doses as needed for chest pain.  . Omega-3 Fatty Acids (FISH OIL) 1000 MG CAPS Take 1,000 mg by mouth in the morning and at bedtime.  . sacubitril-valsartan (ENTRESTO)  24-26 MG Take 1 tablet by mouth 2 (two) times daily.     Allergies:   Patient has no known allergies.   Social History   Socioeconomic History  . Marital status: Married    Spouse name: Not on file  . Number of children: Not on file  . Years of education: Not on file  . Highest education level: Not on file  Occupational History  . Not on file  Tobacco Use  . Smoking status: Former Smoker    Types: Pipe  . Smokeless tobacco: Former Systems developer    Quit date: 07/15/1968  Substance and Sexual Activity  . Alcohol use: No  . Drug use: No  . Sexual activity: Not on file  Other Topics Concern  . Not on file  Social History Narrative  . Not on file   Social Determinants of Health   Financial Resource Strain:   . Difficulty of Paying Living Expenses: Not on file  Food Insecurity:   . Worried About Charity fundraiser in the Last Year: Not on file  . Ran Out of Food in the Last Year: Not on file  Transportation Needs:   .  Lack of Transportation (Medical): Not on file  . Lack of Transportation (Non-Medical): Not on file  Physical Activity:   . Days of Exercise per Week: Not on file  . Minutes of Exercise per Session: Not on file  Stress:   . Feeling of Stress : Not on file  Social Connections:   . Frequency of Communication with Friends and Family: Not on file  . Frequency of Social Gatherings with Friends and Family: Not on file  . Attends Religious Services: Not on file  . Active Member of Clubs or Organizations: Not on file  . Attends Archivist Meetings: Not on file  . Marital Status: Not on file     Family History: The patient's family history includes Cancer in his father; Heart attack in his brother, father, and sister; Hyperlipidemia in his brother and father; Hypertension in his brother, father, and sister.  ROS:   Please see the history of present illness.     All other systems reviewed and are negative.  EKGs/Labs/Other Studies Reviewed:    The following  studies were reviewed today:  CATH 05/03/2020:  Acute inferior STEMI related to thrombotic occlusion of the saphenous vein graft to the right coronary, with probable late stent thrombosis after missing Brilinta for 3 days.  Successful angioplasty with reperfusion of the inferior wall reducing the 100% stenosis in the distal bypass graft and native vessel anastomosis, using balloon angioplasty reducing the stenosis to 50% with TIMI grade III flow.  Diffuse proximal to mid 70% stenosis in the vein graft to the obtuse marginal  Patent LIMA to LAD  Totally occluded proximal RCA  Totally occluded proximal to mid circumflex  Totally occluded mid LAD.  Ostial 60% first diagonal.  Widely patent left main  LVEDP was not obtained as the patient developed ventricular tachycardia upon entry into the LV with the Judkins right catheter.  RECOMMENDATIONS:   IV nitroglycerin for blood pressure, improvement in microcirculatory flow, and to reduce LV end-diastolic pressures.  Aspirin and Plavix should be continued indefinitely.  Consider coronary bypass grafting of the to degenerated vein conduits as noted above.  Risk factor modification  ECHO 05/03/2020 1. Since the last study on 06/23/2019 LVEF has decreased from 35-40% to  25-30%, there is a suspicion for left ventricular apical thrombus  measuring 14 x 6 mm.  2. Left ventricular ejection fraction, by estimation, is 25 to 30%. The  left ventricle has severely decreased function. The left ventricle  demonstrates global hypokinesis. The left ventricular internal cavity size  was moderately dilated. Left  ventricular diastolic parameters are consistent with Grade I diastolic  dysfunction (impaired relaxation). Elevated left atrial pressure.  3. Right ventricular systolic function is moderately reduced. The right  ventricular size is moderately enlarged. There is mildly elevated  pulmonary artery systolic pressure. The estimated right  ventricular  systolic pressure is 60.6 mmHg.  4. Left atrial size was moderately dilated.  5. Right atrial size was moderately dilated.  6. The mitral valve is normal in structure. Mild mitral valve  regurgitation. No evidence of mitral stenosis.  7. The aortic valve is normal in structure. Aortic valve regurgitation is  mild. No aortic stenosis is present.  8. The inferior vena cava is normal in size with greater than 50%  respiratory variability, suggesting right atrial pressure of 3 mmHg.   EKG:  EKG is ot ordered today.  It shows sinus rhythm with mild first-degree AV block (PR 200 ms), left atrial abnormality, nonspecific intraventricular  conduction delay (QRS 136 ms) with Q waves in leads II, 3, aVF, V4-V6.  QTc 426.  Persistent subtle ST segment elevation in leads II, III, aVF, V5, V6 this concerning for possible aneurysm formation.  Recent Labs: 05/03/2020: ALT 19 05/04/2020: B Natriuretic Peptide 1,408.4; Hemoglobin 14.3; Platelets 278 05/23/2020: BUN 20; Creatinine, Ser 1.30; Potassium 5.1; Sodium 139  Recent Lipid Panel    Component Value Date/Time   CHOL 174 05/04/2020 0536   CHOL 126 10/12/2019 0835   TRIG 153 (H) 05/04/2020 0536   HDL 29 (L) 05/04/2020 0536   HDL 29 (L) 10/12/2019 0835   CHOLHDL 6.0 05/04/2020 0536   VLDL 31 05/04/2020 0536   LDLCALC 114 (H) 05/04/2020 0536   LDLCALC 58 10/12/2019 0835   LDLDIRECT 94.8 03/23/2019 2046    Physical Exam:    VS:  BP 123/76   Pulse (!) 50   Ht 5\' 7"  (1.702 m)   Wt 206 lb (93.4 kg)   SpO2 98%   BMI 32.26 kg/m     Wt Readings from Last 3 Encounters:  05/26/20 206 lb (93.4 kg)  05/06/20 198 lb 4.8 oz (89.9 kg)  06/25/19 205 lb (93 kg)      General: Alert, oriented x3, no distress, obese Head: no evidence of trauma, PERRL, EOMI, no exophtalmos or lid lag, no myxedema, no xanthelasma; normal ears, nose and oropharynx Neck: normal jugular venous pulsations and no hepatojugular reflux; brisk carotid pulses  without delay and no carotid bruits Chest: clear to auscultation, no signs of consolidation by percussion or palpation, normal fremitus, symmetrical and full respiratory excursions Cardiovascular: normal position and quality of the apical impulse, regular rhythm, normal first and second heart sounds, no murmurs, rubs or S3 gallops.  S4 is present Abdomen: no tenderness or distention, no masses by palpation, no abnormal pulsatility or arterial bruits, normal bowel sounds, no hepatosplenomegaly Extremities: no clubbing, cyanosis or edema; 2+ radial, ulnar and brachial pulses bilaterally; 2+ right femoral, posterior tibial and dorsalis pedis pulses; 2+ left femoral, posterior tibial and dorsalis pedis pulses; no subclavian or femoral bruits Neurological: grossly nonfocal Psych: Normal mood and affect   ASSESSMENT:    No diagnosis found. PLAN:    In order of problems listed above:  1. CAD: Asymptomatic. Plan dual antiplatelet therapy indefinitely.  Continue beta-blockers and statin.  May have to consider surgical revascularization, but defer that decision for the time being. 2. CHF: Severely depressed left ventricular systolic function, but appears clinically euvolemic and with good functional status, NYHA class I.  On Entresto and beta-blocker.  Will increase the Entresto to the 49-51 mg dose today.  Notes that he had problems with hyperkalemia with this medication in the past.  We will have him come back to see our Pharm.D. in a couple of weeks and recheck labs at that time.  Avoid Aldactone.  Recheck left ventricular systolic function by echo towards the end of January.  May need ICD.  Encouraged him to continue wearing the LifeVest as prescribed in the meantime. 3. NSVT: Very frequent PVCs and brief runs of nonsustained VT throughout his hospitalization following his last acute MI.  Continue LifeVest, may need ICD.  On beta-blockers. 4. HTN: Well-controlled, allows further titration of his  Entresto. 5. HLP: Target LDL less than 70.  Plan to recheck lipid profile in January on combination maximum dose atorvastatin/ezetimibe. 6. Dilated aortic root: Mild enlargement.  May choose to just use echocardiography to avoid contrast exposure due to CKD. 7. CKD  3a: Baseline creatinine around 1.3 and GFR around 55.  Monitor while titrating Entresto. 8. Prediabetes: Most recent hemoglobin A1c 6.1% without therapy.  Encourage weight loss.  Needs to start a program of regular physical exercise gradually. 9. Anxiety/depression: Worsened following his last coronary event.  He is to take Lexapro 10 mg daily in the past and that worked well for him, was started again. While we wait for this to start working, I gave him a short supply of alprazolam to use as needed.  Cautioned against long-term use of benzodiazepines.   Medication Adjustments/Labs and Tests Ordered: Current medicines are reviewed at length with the patient today.  Concerns regarding medicines are outlined above.  No orders of the defined types were placed in this encounter.  No orders of the defined types were placed in this encounter.   There are no Patient Instructions on file for this visit.   Signed, Sanda Klein, MD  05/26/2020 9:03 AM    [facility]

## 2020-05-26 NOTE — Patient Instructions (Addendum)
Medication Instructions:  INCREASE the Entresto to 49/51 mg twice daily START Lexapro 10 mg once daily TAKE Alprazolam 0.5 mg once daily as needed  *If you need a refill on your cardiac medications before your next appointment, please call your pharmacy*   Lab Work: None ordered  If you have labs (blood work) drawn today and your tests are completely normal, you will receive your results only by: Marland Kitchen MyChart Message (if you have MyChart) OR . A paper copy in the mail If you have any lab test that is abnormal or we need to change your treatment, we will call you to review the results.   Testing/Procedures: Your physician has requested that you have an echocardiogram after January 20th 2022. Echocardiography is a painless test that uses sound waves to create images of your heart. It provides your doctor with information about the size and shape of your heart and how well your heart's chambers and valves are working. You may receive an ultrasound enhancing agent through an IV if needed to better visualize your heart during the echo.This procedure takes approximately one hour. There are no restrictions for this procedure. This will take place at the [address].   Follow-Up: At [facility], you and your health needs are our priority.  As part of our continuing mission to provide you with exceptional heart care, we have created designated Provider Care Teams.  These Care Teams include your primary Cardiologist (physician) and Advanced Practice Providers (APPs -  Physician Assistants and Nurse Practitioners) who all work together to provide you with the care you need, when you need it.  We recommend signing up for the patient portal called "MyChart".  Sign up information is provided on this After Visit Summary.  MyChart is used to connect with patients for Virtual Visits (Telemedicine).  Patients are able to view lab/test results, encounter notes, upcoming appointments, etc.   Non-urgent messages can be sent to your provider as well.   To learn more about what you can do with MyChart, go to [email or web address].    Your next appointment:   Follow up in 3 weeks with pharmD for Entresto titration Follow up in February with Dr. Sallyanne Kuster.   The [place]:     Heart-Healthy Eating Plan Heart-healthy meal planning includes:  Eating less unhealthy fats.  Eating more healthy fats.  Making other changes in your diet. Talk with your doctor or a diet specialist (dietitian) to create an eating plan that is right for you. What is my plan? Your doctor may recommend an eating plan that includes:  Total fat: ______% or less of total calories a day.  Saturated fat: ______% or less of total calories a day.  Cholesterol: less than _________mg a day. What are tips for following this plan? Cooking Avoid frying your food. Try to bake, boil, grill, or broil it instead. You can also reduce fat by:  Removing the skin from poultry.  Removing all visible fats from meats.  Steaming vegetables in water or broth. Meal planning   At meals, divide your plate into four equal parts: ? Fill one-half of your plate with vegetables and green salads. ? Fill one-fourth of your plate with whole grains. ? Fill one-fourth of your plate with lean protein foods.  Eat 4-5 servings of vegetables per day. A serving of vegetables is: ? 1 cup of raw or cooked vegetables. ? 2 cups of raw leafy greens.  Eat 4-5 servings of fruit per day.  A serving of fruit is: ? 1 medium whole fruit. ?  cup of dried fruit. ?  cup of fresh, frozen, or canned fruit. ?  cup of 100% fruit juice.  Eat more foods that have soluble fiber. These are apples, broccoli, carrots, beans, peas, and barley. Try to get 20-30 g of fiber per day.  Eat 4-5 servings of nuts, legumes, and seeds per week: ? 1 serving of dried beans or legumes equals  cup after being cooked. ? 1 serving of nuts is  cup. ? 1  serving of seeds equals 1 tablespoon. General information  Eat more home-cooked food. Eat less restaurant, buffet, and fast food.  Limit or avoid alcohol.  Limit foods that are high in starch and sugar.  Avoid fried foods.  Lose weight if you are overweight.  Keep track of how much salt (sodium) you eat. This is important if you have high blood pressure. Ask your doctor to tell you more about this.  Try to add vegetarian meals each week. Fats  Choose healthy fats. These include olive oil and canola oil, flaxseeds, walnuts, almonds, and seeds.  Eat more omega-3 fats. These include salmon, mackerel, sardines, tuna, flaxseed oil, and ground flaxseeds. Try to eat fish at least 2 times each week.  Check food labels. Avoid foods with trans fats or high amounts of saturated fat.  Limit saturated fats. ? These are often found in animal products, such as meats, butter, and cream. ? These are also found in plant foods, such as palm oil, palm kernel oil, and coconut oil.  Avoid foods with partially hydrogenated oils in them. These have trans fats. Examples are stick margarine, some tub margarines, cookies, crackers, and other baked goods. What foods can I eat? Fruits All fresh, canned (in natural juice), or frozen fruits. Vegetables Fresh or frozen vegetables (raw, steamed, roasted, or grilled). Green salads. Grains Most grains. Choose whole wheat and whole grains most of the time. Rice and pasta, including brown rice and pastas made with whole wheat. Meats and other proteins Lean, well-trimmed beef, veal, pork, and lamb. Chicken and [place] without skin. All fish and shellfish. Wild duck, rabbit, pheasant, and venison. Egg whites or low-cholesterol egg substitutes. Dried beans, peas, lentils, and tofu. Seeds and most nuts. Dairy Low-fat or nonfat cheeses, including ricotta and mozzarella. Skim or 1% milk that is liquid, powdered, or evaporated. Buttermilk that is made with low-fat milk.  Nonfat or low-fat yogurt. Fats and oils Non-hydrogenated (trans-free) margarines. Vegetable oils, including soybean, sesame, sunflower, olive, peanut, safflower, corn, canola, and cottonseed. Salad dressings or mayonnaise made with a vegetable oil. Beverages Mineral water. Coffee and tea. Diet carbonated beverages. Sweets and desserts Sherbet, gelatin, and fruit ice. Small amounts of dark chocolate. Limit all sweets and desserts. Seasonings and condiments All seasonings and condiments. The items listed above may not be a complete list of foods and drinks you can eat. Contact a dietitian for more options. What foods should I avoid? Fruits Canned fruit in heavy syrup. Fruit in cream or butter sauce. Fried fruit. Limit coconut. Vegetables Vegetables cooked in cheese, cream, or butter sauce. Fried vegetables. Grains Breads that are made with saturated or trans fats, oils, or whole milk. Croissants. Sweet rolls. Donuts. High-fat crackers, such as cheese crackers. Meats and other proteins Fatty meats, such as hot dogs, ribs, sausage, bacon, rib-eye roast or steak. High-fat deli meats, such as salami and bologna. Caviar. Domestic duck and goose. Organ meats, such as liver. Dairy Cream, sour  cream, cream cheese, and creamed cottage cheese. Whole-milk cheeses. Whole or 2% milk that is liquid, evaporated, or condensed. Whole buttermilk. Cream sauce or high-fat cheese sauce. Yogurt that is made from whole milk. Fats and oils Meat fat, or shortening. Cocoa butter, hydrogenated oils, palm oil, coconut oil, palm kernel oil. Solid fats and shortenings, including bacon fat, salt pork, lard, and butter. Nondairy cream substitutes. Salad dressings with cheese or sour cream. Beverages Regular sodas and juice drinks with added sugar. Sweets and desserts Frosting. Pudding. Cookies. Cakes. Pies. Milk chocolate or white chocolate. Buttered syrups. Full-fat ice cream or ice cream drinks. The items listed above  may not be a complete list of foods and drinks to avoid. Contact a dietitian for more information. Summary  Heart-healthy meal planning includes eating less unhealthy fats, eating more healthy fats, and making other changes in your diet.  Eat a balanced diet. This includes fruits and vegetables, low-fat or nonfat dairy, lean protein, nuts and legumes, whole grains, and heart-healthy oils and fats. This information is not intended to replace advice given to you by your health care provider. Make sure you discuss any questions you have with your health care provider. Document Revised: 09/04/2017 Document Reviewed: 08/08/2017 Elsevier Patient Education  2020 [employer].

## 2020-05-30 ENCOUNTER — Encounter (HOSPITAL_COMMUNITY): Payer: Self-pay

## 2020-05-30 ENCOUNTER — Telehealth (HOSPITAL_COMMUNITY): Payer: Self-pay

## 2020-05-30 NOTE — Telephone Encounter (Signed)
Attempted to call patient in regards to Cardiac Rehab - unable to leave VM, VM full.   Mailed letter 

## 2020-06-02 ENCOUNTER — Other Ambulatory Visit (HOSPITAL_COMMUNITY): Payer: Self-pay

## 2020-06-02 DIAGNOSIS — Z9861 Coronary angioplasty status: Secondary | ICD-10-CM

## 2020-06-02 DIAGNOSIS — I2111 ST elevation (STEMI) myocardial infarction involving right coronary artery: Secondary | ICD-10-CM

## 2020-06-04 ENCOUNTER — Inpatient Hospital Stay (HOSPITAL_COMMUNITY): Payer: Medicare HMO

## 2020-06-04 ENCOUNTER — Encounter (HOSPITAL_COMMUNITY): Payer: Self-pay | Admitting: Anesthesiology

## 2020-06-04 ENCOUNTER — Emergency Department (HOSPITAL_COMMUNITY): Payer: Medicare HMO

## 2020-06-04 ENCOUNTER — Encounter (HOSPITAL_COMMUNITY)
Admission: EM | Disposition: A | Discharge status: Rehabilitation Facility | Payer: Self-pay | Source: Home / Self Care | Attending: Cardiology

## 2020-06-04 ENCOUNTER — Inpatient Hospital Stay (HOSPITAL_COMMUNITY)
Admission: EM | Admit: 2020-06-04 | Discharge: 2020-06-25 | DRG: 224 | Disposition: A | Discharge status: Rehabilitation Facility | Payer: Medicare HMO | Attending: Internal Medicine | Admitting: Internal Medicine

## 2020-06-04 DIAGNOSIS — Z9119 Patient's noncompliance with other medical treatment and regimen: Secondary | ICD-10-CM

## 2020-06-04 DIAGNOSIS — D696 Thrombocytopenia, unspecified: Secondary | ICD-10-CM | POA: Diagnosis not present

## 2020-06-04 DIAGNOSIS — R001 Bradycardia, unspecified: Secondary | ICD-10-CM | POA: Diagnosis not present

## 2020-06-04 DIAGNOSIS — I639 Cerebral infarction, unspecified: Secondary | ICD-10-CM | POA: Diagnosis not present

## 2020-06-04 DIAGNOSIS — J90 Pleural effusion, not elsewhere classified: Secondary | ICD-10-CM

## 2020-06-04 DIAGNOSIS — E7220 Disorder of urea cycle metabolism, unspecified: Secondary | ICD-10-CM | POA: Diagnosis not present

## 2020-06-04 DIAGNOSIS — I69351 Hemiplegia and hemiparesis following cerebral infarction affecting right dominant side: Secondary | ICD-10-CM | POA: Diagnosis not present

## 2020-06-04 DIAGNOSIS — R945 Abnormal results of liver function studies: Secondary | ICD-10-CM | POA: Diagnosis not present

## 2020-06-04 DIAGNOSIS — I255 Ischemic cardiomyopathy: Secondary | ICD-10-CM | POA: Diagnosis not present

## 2020-06-04 DIAGNOSIS — I712 Thoracic aortic aneurysm, without rupture, unspecified: Secondary | ICD-10-CM

## 2020-06-04 DIAGNOSIS — I517 Cardiomegaly: Secondary | ICD-10-CM | POA: Diagnosis not present

## 2020-06-04 DIAGNOSIS — Z978 Presence of other specified devices: Secondary | ICD-10-CM

## 2020-06-04 DIAGNOSIS — R2990 Unspecified symptoms and signs involving the nervous system: Secondary | ICD-10-CM | POA: Diagnosis not present

## 2020-06-04 DIAGNOSIS — I252 Old myocardial infarction: Secondary | ICD-10-CM

## 2020-06-04 DIAGNOSIS — R7303 Prediabetes: Secondary | ICD-10-CM | POA: Diagnosis present

## 2020-06-04 DIAGNOSIS — D72829 Elevated white blood cell count, unspecified: Secondary | ICD-10-CM | POA: Diagnosis not present

## 2020-06-04 DIAGNOSIS — G928 Other toxic encephalopathy: Secondary | ICD-10-CM | POA: Diagnosis not present

## 2020-06-04 DIAGNOSIS — J189 Pneumonia, unspecified organism: Secondary | ICD-10-CM | POA: Diagnosis not present

## 2020-06-04 DIAGNOSIS — I2782 Chronic pulmonary embolism: Secondary | ICD-10-CM | POA: Diagnosis present

## 2020-06-04 DIAGNOSIS — I82443 Acute embolism and thrombosis of tibial vein, bilateral: Secondary | ICD-10-CM | POA: Diagnosis not present

## 2020-06-04 DIAGNOSIS — Z95810 Presence of automatic (implantable) cardiac defibrillator: Secondary | ICD-10-CM | POA: Diagnosis not present

## 2020-06-04 DIAGNOSIS — I63412 Cerebral infarction due to embolism of left middle cerebral artery: Secondary | ICD-10-CM | POA: Diagnosis not present

## 2020-06-04 DIAGNOSIS — I2510 Atherosclerotic heart disease of native coronary artery without angina pectoris: Secondary | ICD-10-CM

## 2020-06-04 DIAGNOSIS — G319 Degenerative disease of nervous system, unspecified: Secondary | ICD-10-CM | POA: Diagnosis not present

## 2020-06-04 DIAGNOSIS — N179 Acute kidney failure, unspecified: Secondary | ICD-10-CM | POA: Diagnosis not present

## 2020-06-04 DIAGNOSIS — J9601 Acute respiratory failure with hypoxia: Secondary | ICD-10-CM | POA: Diagnosis not present

## 2020-06-04 DIAGNOSIS — R570 Cardiogenic shock: Secondary | ICD-10-CM | POA: Diagnosis not present

## 2020-06-04 DIAGNOSIS — N183 Chronic kidney disease, stage 3 unspecified: Secondary | ICD-10-CM | POA: Diagnosis present

## 2020-06-04 DIAGNOSIS — I130 Hypertensive heart and chronic kidney disease with heart failure and stage 1 through stage 4 chronic kidney disease, or unspecified chronic kidney disease: Secondary | ICD-10-CM | POA: Diagnosis present

## 2020-06-04 DIAGNOSIS — R000 Tachycardia, unspecified: Secondary | ICD-10-CM | POA: Diagnosis not present

## 2020-06-04 DIAGNOSIS — R490 Dysphonia: Secondary | ICD-10-CM | POA: Diagnosis not present

## 2020-06-04 DIAGNOSIS — I82451 Acute embolism and thrombosis of right peroneal vein: Secondary | ICD-10-CM | POA: Diagnosis not present

## 2020-06-04 DIAGNOSIS — Z4682 Encounter for fitting and adjustment of non-vascular catheter: Secondary | ICD-10-CM | POA: Diagnosis not present

## 2020-06-04 DIAGNOSIS — I5022 Chronic systolic (congestive) heart failure: Secondary | ICD-10-CM | POA: Diagnosis not present

## 2020-06-04 DIAGNOSIS — R29818 Other symptoms and signs involving the nervous system: Secondary | ICD-10-CM | POA: Diagnosis not present

## 2020-06-04 DIAGNOSIS — I5020 Unspecified systolic (congestive) heart failure: Secondary | ICD-10-CM | POA: Diagnosis not present

## 2020-06-04 DIAGNOSIS — I462 Cardiac arrest due to underlying cardiac condition: Secondary | ICD-10-CM | POA: Diagnosis present

## 2020-06-04 DIAGNOSIS — R1313 Dysphagia, pharyngeal phase: Secondary | ICD-10-CM | POA: Diagnosis not present

## 2020-06-04 DIAGNOSIS — I82462 Acute embolism and thrombosis of left calf muscular vein: Secondary | ICD-10-CM | POA: Diagnosis not present

## 2020-06-04 DIAGNOSIS — J9811 Atelectasis: Secondary | ICD-10-CM | POA: Diagnosis not present

## 2020-06-04 DIAGNOSIS — R140 Abdominal distension (gaseous): Secondary | ICD-10-CM

## 2020-06-04 DIAGNOSIS — J9691 Respiratory failure, unspecified with hypoxia: Secondary | ICD-10-CM | POA: Diagnosis not present

## 2020-06-04 DIAGNOSIS — G479 Sleep disorder, unspecified: Secondary | ICD-10-CM | POA: Diagnosis not present

## 2020-06-04 DIAGNOSIS — I6523 Occlusion and stenosis of bilateral carotid arteries: Secondary | ICD-10-CM | POA: Diagnosis not present

## 2020-06-04 DIAGNOSIS — I110 Hypertensive heart disease with heart failure: Secondary | ICD-10-CM | POA: Diagnosis not present

## 2020-06-04 DIAGNOSIS — I5023 Acute on chronic systolic (congestive) heart failure: Secondary | ICD-10-CM | POA: Diagnosis not present

## 2020-06-04 DIAGNOSIS — I469 Cardiac arrest, cause unspecified: Secondary | ICD-10-CM

## 2020-06-04 DIAGNOSIS — R7401 Elevation of levels of liver transaminase levels: Secondary | ICD-10-CM | POA: Diagnosis not present

## 2020-06-04 DIAGNOSIS — R5381 Other malaise: Secondary | ICD-10-CM | POA: Diagnosis not present

## 2020-06-04 DIAGNOSIS — Z955 Presence of coronary angioplasty implant and graft: Secondary | ICD-10-CM

## 2020-06-04 DIAGNOSIS — I2699 Other pulmonary embolism without acute cor pulmonale: Secondary | ICD-10-CM | POA: Diagnosis not present

## 2020-06-04 DIAGNOSIS — Z79899 Other long term (current) drug therapy: Secondary | ICD-10-CM

## 2020-06-04 DIAGNOSIS — I6502 Occlusion and stenosis of left vertebral artery: Secondary | ICD-10-CM | POA: Diagnosis not present

## 2020-06-04 DIAGNOSIS — I472 Ventricular tachycardia, unspecified: Secondary | ICD-10-CM

## 2020-06-04 DIAGNOSIS — Z7982 Long term (current) use of aspirin: Secondary | ICD-10-CM

## 2020-06-04 DIAGNOSIS — I213 ST elevation (STEMI) myocardial infarction of unspecified site: Secondary | ICD-10-CM | POA: Diagnosis not present

## 2020-06-04 DIAGNOSIS — J9602 Acute respiratory failure with hypercapnia: Secondary | ICD-10-CM | POA: Diagnosis present

## 2020-06-04 DIAGNOSIS — G8191 Hemiplegia, unspecified affecting right dominant side: Secondary | ICD-10-CM | POA: Diagnosis not present

## 2020-06-04 DIAGNOSIS — I5043 Acute on chronic combined systolic (congestive) and diastolic (congestive) heart failure: Secondary | ICD-10-CM | POA: Diagnosis present

## 2020-06-04 DIAGNOSIS — I2693 Single subsegmental pulmonary embolism without acute cor pulmonale: Secondary | ICD-10-CM | POA: Diagnosis not present

## 2020-06-04 DIAGNOSIS — I6782 Cerebral ischemia: Secondary | ICD-10-CM | POA: Diagnosis not present

## 2020-06-04 DIAGNOSIS — R42 Dizziness and giddiness: Secondary | ICD-10-CM | POA: Diagnosis not present

## 2020-06-04 DIAGNOSIS — I5082 Biventricular heart failure: Secondary | ICD-10-CM | POA: Diagnosis not present

## 2020-06-04 DIAGNOSIS — Z20822 Contact with and (suspected) exposure to covid-19: Secondary | ICD-10-CM | POA: Diagnosis present

## 2020-06-04 DIAGNOSIS — R748 Abnormal levels of other serum enzymes: Secondary | ICD-10-CM

## 2020-06-04 DIAGNOSIS — Z8249 Family history of ischemic heart disease and other diseases of the circulatory system: Secondary | ICD-10-CM

## 2020-06-04 DIAGNOSIS — I5021 Acute systolic (congestive) heart failure: Secondary | ICD-10-CM | POA: Diagnosis not present

## 2020-06-04 DIAGNOSIS — I495 Sick sinus syndrome: Secondary | ICD-10-CM | POA: Diagnosis present

## 2020-06-04 DIAGNOSIS — L8990 Pressure ulcer of unspecified site, unspecified stage: Secondary | ICD-10-CM | POA: Diagnosis not present

## 2020-06-04 DIAGNOSIS — Z7902 Long term (current) use of antithrombotics/antiplatelets: Secondary | ICD-10-CM

## 2020-06-04 DIAGNOSIS — I672 Cerebral atherosclerosis: Secondary | ICD-10-CM | POA: Diagnosis not present

## 2020-06-04 DIAGNOSIS — E785 Hyperlipidemia, unspecified: Secondary | ICD-10-CM | POA: Diagnosis present

## 2020-06-04 DIAGNOSIS — Z4659 Encounter for fitting and adjustment of other gastrointestinal appliance and device: Secondary | ICD-10-CM

## 2020-06-04 DIAGNOSIS — R009 Unspecified abnormalities of heart beat: Secondary | ICD-10-CM

## 2020-06-04 DIAGNOSIS — Z8679 Personal history of other diseases of the circulatory system: Secondary | ICD-10-CM

## 2020-06-04 DIAGNOSIS — Z87891 Personal history of nicotine dependence: Secondary | ICD-10-CM

## 2020-06-04 DIAGNOSIS — Z83438 Family history of other disorder of lipoprotein metabolism and other lipidemia: Secondary | ICD-10-CM

## 2020-06-04 DIAGNOSIS — Z452 Encounter for adjustment and management of vascular access device: Secondary | ICD-10-CM | POA: Diagnosis not present

## 2020-06-04 DIAGNOSIS — Y95 Nosocomial condition: Secondary | ICD-10-CM | POA: Diagnosis not present

## 2020-06-04 DIAGNOSIS — Z951 Presence of aortocoronary bypass graft: Secondary | ICD-10-CM | POA: Diagnosis not present

## 2020-06-04 DIAGNOSIS — Z8673 Personal history of transient ischemic attack (TIA), and cerebral infarction without residual deficits: Secondary | ICD-10-CM

## 2020-06-04 DIAGNOSIS — Z01818 Encounter for other preprocedural examination: Secondary | ICD-10-CM

## 2020-06-04 DIAGNOSIS — J9690 Respiratory failure, unspecified, unspecified whether with hypoxia or hypercapnia: Secondary | ICD-10-CM

## 2020-06-04 DIAGNOSIS — J181 Lobar pneumonia, unspecified organism: Secondary | ICD-10-CM | POA: Diagnosis not present

## 2020-06-04 DIAGNOSIS — I420 Dilated cardiomyopathy: Secondary | ICD-10-CM | POA: Diagnosis not present

## 2020-06-04 DIAGNOSIS — I513 Intracardiac thrombosis, not elsewhere classified: Secondary | ICD-10-CM | POA: Diagnosis not present

## 2020-06-04 DIAGNOSIS — I2690 Septic pulmonary embolism without acute cor pulmonale: Secondary | ICD-10-CM | POA: Diagnosis not present

## 2020-06-04 DIAGNOSIS — I6310 Cerebral infarction due to embolism of unspecified precerebral artery: Secondary | ICD-10-CM | POA: Diagnosis not present

## 2020-06-04 DIAGNOSIS — I499 Cardiac arrhythmia, unspecified: Secondary | ICD-10-CM | POA: Diagnosis not present

## 2020-06-04 DIAGNOSIS — E7800 Pure hypercholesterolemia, unspecified: Secondary | ICD-10-CM | POA: Diagnosis present

## 2020-06-04 HISTORY — PX: TEMPORARY PACEMAKER: CATH118268

## 2020-06-04 HISTORY — PX: RIGHT/LEFT HEART CATH AND CORONARY/GRAFT ANGIOGRAPHY: CATH118267

## 2020-06-04 HISTORY — PX: IABP INSERTION: CATH118242

## 2020-06-04 LAB — CBC
HCT: 47.3 % (ref 39.0–52.0)
Hemoglobin: 16.1 g/dL (ref 13.0–17.0)
MCH: 31.6 pg (ref 26.0–34.0)
MCHC: 34 g/dL (ref 30.0–36.0)
MCV: 92.7 fL (ref 80.0–100.0)
Platelets: 341 10*3/uL (ref 150–400)
RBC: 5.1 MIL/uL (ref 4.22–5.81)
RDW: 12.6 % (ref 11.5–15.5)
WBC: 21.1 10*3/uL — ABNORMAL HIGH (ref 4.0–10.5)
nRBC: 0 % (ref 0.0–0.2)

## 2020-06-04 LAB — CBC WITH DIFFERENTIAL/PLATELET
Abs Immature Granulocytes: 0.02 10*3/uL (ref 0.00–0.07)
Basophils Absolute: 0.1 10*3/uL (ref 0.0–0.1)
Basophils Relative: 1 %
Eosinophils Absolute: 0.2 10*3/uL (ref 0.0–0.5)
Eosinophils Relative: 3 %
HCT: 53.7 % — ABNORMAL HIGH (ref 39.0–52.0)
Hemoglobin: 17.3 g/dL — ABNORMAL HIGH (ref 13.0–17.0)
Immature Granulocytes: 0 %
Lymphocytes Relative: 67 %
Lymphs Abs: 4.3 10*3/uL — ABNORMAL HIGH (ref 0.7–4.0)
MCH: 30.7 pg (ref 26.0–34.0)
MCHC: 32.2 g/dL (ref 30.0–36.0)
MCV: 95.2 fL (ref 80.0–100.0)
Monocytes Absolute: 0.4 10*3/uL (ref 0.1–1.0)
Monocytes Relative: 5 %
Neutro Abs: 1.5 10*3/uL — ABNORMAL LOW (ref 1.7–7.7)
Neutrophils Relative %: 24 %
Platelets: 297 10*3/uL (ref 150–400)
RBC: 5.64 MIL/uL (ref 4.22–5.81)
RDW: 12.6 % (ref 11.5–15.5)
WBC: 6.5 10*3/uL (ref 4.0–10.5)
nRBC: 0 % (ref 0.0–0.2)

## 2020-06-04 LAB — POCT I-STAT 7, (LYTES, BLD GAS, ICA,H+H)
Acid-base deficit: 3 mmol/L — ABNORMAL HIGH (ref 0.0–2.0)
Acid-base deficit: 3 mmol/L — ABNORMAL HIGH (ref 0.0–2.0)
Bicarbonate: 21.2 mmol/L (ref 20.0–28.0)
Bicarbonate: 21.8 mmol/L (ref 20.0–28.0)
Calcium, Ion: 1.13 mmol/L — ABNORMAL LOW (ref 1.15–1.40)
Calcium, Ion: 1.15 mmol/L (ref 1.15–1.40)
HCT: 47 % (ref 39.0–52.0)
HCT: 49 % (ref 39.0–52.0)
Hemoglobin: 16 g/dL (ref 13.0–17.0)
Hemoglobin: 16.7 g/dL (ref 13.0–17.0)
O2 Saturation: 100 %
O2 Saturation: 100 %
Patient temperature: 36.1
Patient temperature: 36.1
Potassium: 4.3 mmol/L (ref 3.5–5.1)
Potassium: 4.5 mmol/L (ref 3.5–5.1)
Sodium: 140 mmol/L (ref 135–145)
Sodium: 140 mmol/L (ref 135–145)
TCO2: 22 mmol/L (ref 22–32)
TCO2: 23 mmol/L (ref 22–32)
pCO2 arterial: 32.6 mmHg (ref 32.0–48.0)
pCO2 arterial: 35.9 mmHg (ref 32.0–48.0)
pH, Arterial: 7.387 (ref 7.350–7.450)
pH, Arterial: 7.418 (ref 7.350–7.450)
pO2, Arterial: 216 mmHg — ABNORMAL HIGH (ref 83.0–108.0)
pO2, Arterial: 241 mmHg — ABNORMAL HIGH (ref 83.0–108.0)

## 2020-06-04 LAB — RESPIRATORY PANEL BY RT PCR (FLU A&B, COVID)
Influenza A by PCR: NEGATIVE
Influenza B by PCR: NEGATIVE
SARS Coronavirus 2 by RT PCR: NEGATIVE

## 2020-06-04 LAB — CREATININE, SERUM
Creatinine, Ser: 1.62 mg/dL — ABNORMAL HIGH (ref 0.61–1.24)
GFR, Estimated: 47 mL/min — ABNORMAL LOW (ref >60)

## 2020-06-04 LAB — TROPONIN I (HIGH SENSITIVITY): Troponin I (High Sensitivity): 157 ng/L (ref <18)

## 2020-06-04 LAB — COMPREHENSIVE METABOLIC PANEL
ALT: 22 U/L (ref 0–44)
AST: 26 U/L (ref 15–41)
Albumin: 3.9 g/dL (ref 3.5–5.0)
Alkaline Phosphatase: 66 U/L (ref 38–126)
Anion gap: 13 (ref 5–15)
BUN: 20 mg/dL (ref 8–23)
CO2: 22 mmol/L (ref 22–32)
Calcium: 9 mg/dL (ref 8.9–10.3)
Chloride: 104 mmol/L (ref 98–111)
Creatinine, Ser: 1.78 mg/dL — ABNORMAL HIGH (ref 0.61–1.24)
GFR, Estimated: 42 mL/min — ABNORMAL LOW (ref >60)
Glucose, Bld: 143 mg/dL — ABNORMAL HIGH (ref 70–99)
Potassium: 4.2 mmol/L (ref 3.5–5.1)
Sodium: 139 mmol/L (ref 135–145)
Total Bilirubin: 1.1 mg/dL (ref 0.3–1.2)
Total Protein: 7.3 g/dL (ref 6.5–8.1)

## 2020-06-04 LAB — HIV ANTIBODY (ROUTINE TESTING W REFLEX): HIV Screen 4th Generation wRfx: NONREACTIVE

## 2020-06-04 LAB — POCT ACTIVATED CLOTTING TIME: Activated Clotting Time: 109 seconds

## 2020-06-04 LAB — TRIGLYCERIDES: Triglycerides: 270 mg/dL — ABNORMAL HIGH (ref <150)

## 2020-06-04 LAB — MRSA PCR SCREENING: MRSA by PCR: NEGATIVE

## 2020-06-04 SURGERY — RIGHT/LEFT HEART CATH AND CORONARY/GRAFT ANGIOGRAPHY
Anesthesia: LOCAL

## 2020-06-04 MED ORDER — AMIODARONE HCL IN DEXTROSE 360-4.14 MG/200ML-% IV SOLN
60.0000 mg/h | INTRAVENOUS | Status: AC
  Administered 2020-06-04: 60 mg/h via INTRAVENOUS
  Filled 2020-06-04: qty 200

## 2020-06-04 MED ORDER — STERILE WATER FOR INJECTION IJ SOLN
INTRAMUSCULAR | Status: AC
  Filled 2020-06-04: qty 10

## 2020-06-04 MED ORDER — SODIUM CHLORIDE 0.9 % WEIGHT BASED INFUSION
1.0000 mL/kg/h | INTRAVENOUS | Status: AC
  Administered 2020-06-04: 1 mL/kg/h via INTRAVENOUS

## 2020-06-04 MED ORDER — DOPAMINE-DEXTROSE 3.2-5 MG/ML-% IV SOLN
0.0000 ug/kg/min | INTRAVENOUS | Status: DC
  Administered 2020-06-04: 15 ug/kg/min via INTRAVENOUS

## 2020-06-04 MED ORDER — NITROGLYCERIN 0.4 MG SL SUBL: 0.4000 mg | SUBLINGUAL_TABLET | SUBLINGUAL | Status: DC | PRN

## 2020-06-04 MED ORDER — FENTANYL 2500MCG IN NS 250ML (10MCG/ML) PREMIX INFUSION
25.0000 ug/h | INTRAVENOUS | Status: DC
  Administered 2020-06-04 – 2020-06-05 (×2): 100 ug/h via INTRAVENOUS
  Administered 2020-06-06: 200 ug/h via INTRAVENOUS
  Filled 2020-06-04 (×3): qty 250

## 2020-06-04 MED ORDER — HEPARIN (PORCINE) IN NACL 1000-0.9 UT/500ML-% IV SOLN
INTRAVENOUS | Status: AC
  Filled 2020-06-04: qty 1000

## 2020-06-04 MED ORDER — PANTOPRAZOLE SODIUM 40 MG IV SOLR
40.0000 mg | Freq: Every day | INTRAVENOUS | Status: DC
  Administered 2020-06-04 – 2020-06-10 (×7): 40 mg via INTRAVENOUS
  Filled 2020-06-04 (×7): qty 40

## 2020-06-04 MED ORDER — EZETIMIBE 10 MG PO TABS
10.0000 mg | ORAL_TABLET | Freq: Every day | ORAL | Status: DC
  Administered 2020-06-05 – 2020-06-10 (×6): 10 mg
  Filled 2020-06-04 (×6): qty 1

## 2020-06-04 MED ORDER — HEPARIN (PORCINE) 25000 UT/250ML-% IV SOLN
1050.0000 [IU]/h | INTRAVENOUS | Status: DC
  Administered 2020-06-04: 1050 [IU]/h via INTRAVENOUS
  Filled 2020-06-04: qty 250

## 2020-06-04 MED ORDER — ROCURONIUM BROMIDE 50 MG/5ML IV SOLN
INTRAVENOUS | Status: AC | PRN
  Administered 2020-06-04: 100 mg via INTRAVENOUS

## 2020-06-04 MED ORDER — ESCITALOPRAM OXALATE 10 MG PO TABS
10.0000 mg | ORAL_TABLET | Freq: Every day | ORAL | Status: DC
  Administered 2020-06-05 – 2020-06-10 (×5): 10 mg
  Filled 2020-06-04 (×6): qty 1

## 2020-06-04 MED ORDER — CLOPIDOGREL BISULFATE 75 MG PO TABS: 75.0000 mg | ORAL_TABLET | Freq: Every day | ORAL | Status: DC

## 2020-06-04 MED ORDER — LIDOCAINE IN D5W 4-5 MG/ML-% IV SOLN
2.0000 mg/min | INTRAVENOUS | Status: DC
  Administered 2020-06-04: 2 mg/min via INTRAVENOUS
  Filled 2020-06-04 (×2): qty 500

## 2020-06-04 MED ORDER — IOHEXOL 350 MG/ML SOLN
INTRAVENOUS | Status: DC | PRN
  Administered 2020-06-04: 70 mL via INTRA_ARTERIAL

## 2020-06-04 MED ORDER — POLYETHYLENE GLYCOL 3350 17 G PO PACK: 17.0000 g | PACK | Freq: Every day | ORAL | Status: DC

## 2020-06-04 MED ORDER — ALPRAZOLAM 0.5 MG PO TABS: 0.5000 mg | ORAL_TABLET | Freq: Every evening | ORAL | Status: DC | PRN

## 2020-06-04 MED ORDER — DOCUSATE SODIUM 50 MG/5ML PO LIQD
100.0000 mg | Freq: Two times a day (BID) | ORAL | Status: DC
  Administered 2020-06-04 – 2020-06-08 (×8): 100 mg
  Filled 2020-06-04 (×11): qty 10

## 2020-06-04 MED ORDER — DOPAMINE-DEXTROSE 3.2-5 MG/ML-% IV SOLN
INTRAVENOUS | Status: AC | PRN
  Administered 2020-06-04: 15 ug/kg/min via INTRAVENOUS

## 2020-06-04 MED ORDER — ACETAMINOPHEN 160 MG/5ML PO SOLN
650.0000 mg | ORAL | Status: DC | PRN
  Administered 2020-06-05 – 2020-06-06 (×2): 650 mg
  Filled 2020-06-04 (×2): qty 20.3

## 2020-06-04 MED ORDER — ASPIRIN 81 MG PO CHEW
324.0000 mg | CHEWABLE_TABLET | Freq: Once | ORAL | Status: AC
  Administered 2020-06-04: 324 mg via ORAL
  Filled 2020-06-04: qty 4

## 2020-06-04 MED ORDER — AMIODARONE HCL IN DEXTROSE 360-4.14 MG/200ML-% IV SOLN
INTRAVENOUS | Status: AC | PRN
  Administered 2020-06-04: 30 mg/h via INTRAVENOUS

## 2020-06-04 MED ORDER — NOREPINEPHRINE 4 MG/250ML-% IV SOLN
0.0000 ug/min | INTRAVENOUS | Status: DC
  Administered 2020-06-04: 30 ug/min via INTRAVENOUS
  Administered 2020-06-04: 12 ug/min via INTRAVENOUS
  Administered 2020-06-05 (×3): 14 ug/min via INTRAVENOUS
  Filled 2020-06-04 (×4): qty 250

## 2020-06-04 MED ORDER — SODIUM CHLORIDE 0.9% FLUSH: 10.0000 mL | INTRAVENOUS | Status: DC | PRN

## 2020-06-04 MED ORDER — LIDOCAINE HCL (CARDIAC) PF 100 MG/5ML IV SOSY
PREFILLED_SYRINGE | INTRAVENOUS | Status: AC | PRN
  Administered 2020-06-04: 100 mg via INTRAVENOUS

## 2020-06-04 MED ORDER — AMIODARONE LOAD VIA INFUSION
150.0000 mg | Freq: Once | INTRAVENOUS | Status: AC
  Administered 2020-06-04: 150 mg via INTRAVENOUS
  Filled 2020-06-04: qty 83.34

## 2020-06-04 MED ORDER — MIDAZOLAM HCL 2 MG/2ML IJ SOLN
INTRAMUSCULAR | Status: AC
  Filled 2020-06-04: qty 2

## 2020-06-04 MED ORDER — PROPOFOL 1000 MG/100ML IV EMUL
5.0000 ug/kg/min | INTRAVENOUS | Status: DC
Start: 2020-06-04 — End: 2020-06-04

## 2020-06-04 MED ORDER — FENTANYL BOLUS VIA INFUSION
25.0000 ug | INTRAVENOUS | Status: DC | PRN
  Administered 2020-06-04: 25 ug via INTRAVENOUS
  Filled 2020-06-04: qty 25

## 2020-06-04 MED ORDER — HEPARIN (PORCINE) 25000 UT/250ML-% IV SOLN
1200.0000 [IU]/h | INTRAVENOUS | Status: DC
  Administered 2020-06-05: 1200 [IU]/h via INTRAVENOUS
  Filled 2020-06-04: qty 250

## 2020-06-04 MED ORDER — VECURONIUM BROMIDE 10 MG IV SOLR
INTRAVENOUS | Status: AC
  Filled 2020-06-04: qty 10

## 2020-06-04 MED ORDER — LIDOCAINE HCL (PF) 1 % IJ SOLN
INTRAMUSCULAR | Status: DC | PRN
  Administered 2020-06-04: 18 mL via SUBCUTANEOUS
  Administered 2020-06-04: 5 mL via SUBCUTANEOUS

## 2020-06-04 MED ORDER — SODIUM CHLORIDE 0.9 % IV SOLN
INTRAVENOUS | Status: AC | PRN
  Administered 2020-06-04: 10 mL/h via INTRAVENOUS

## 2020-06-04 MED ORDER — HEPARIN (PORCINE) 25000 UT/250ML-% IV SOLN
12.0000 [IU]/kg/h | INTRAVENOUS | Status: DC
Start: 2020-06-04 — End: 2020-06-04

## 2020-06-04 MED ORDER — ASPIRIN EC 81 MG PO TBEC: 81.0000 mg | DELAYED_RELEASE_TABLET | Freq: Every day | ORAL | Status: DC

## 2020-06-04 MED ORDER — PROPOFOL 1000 MG/100ML IV EMUL
5.0000 ug/kg/min | INTRAVENOUS | Status: DC
  Administered 2020-06-04: 10 ug/kg/min via INTRAVENOUS
  Administered 2020-06-04: 50 ug/kg/min via INTRAVENOUS
  Administered 2020-06-04: 40 ug/kg/min via INTRAVENOUS
  Administered 2020-06-05: 10 ug/kg/min via INTRAVENOUS
  Administered 2020-06-05 (×2): 40 ug/kg/min via INTRAVENOUS
  Administered 2020-06-05 (×2): 30 ug/kg/min via INTRAVENOUS
  Administered 2020-06-06: 10 ug/kg/min via INTRAVENOUS
  Filled 2020-06-04 (×9): qty 100

## 2020-06-04 MED ORDER — SODIUM CHLORIDE 0.9% FLUSH
10.0000 mL | Freq: Two times a day (BID) | INTRAVENOUS | Status: DC
  Administered 2020-06-05 – 2020-06-14 (×15): 10 mL

## 2020-06-04 MED ORDER — AMIODARONE HCL IN DEXTROSE 360-4.14 MG/200ML-% IV SOLN
30.0000 mg/h | INTRAVENOUS | Status: DC
  Administered 2020-06-05 – 2020-06-10 (×12): 30 mg/h via INTRAVENOUS
  Filled 2020-06-04 (×12): qty 200

## 2020-06-04 MED ORDER — LIDOCAINE BOLUS VIA INFUSION
100.0000 mg | Freq: Once | INTRAVENOUS | Status: AC
  Administered 2020-06-04: 100 mg via INTRAVENOUS
  Filled 2020-06-04: qty 100

## 2020-06-04 MED ORDER — LIDOCAINE HCL (PF) 1 % IJ SOLN
INTRAMUSCULAR | Status: AC
  Filled 2020-06-04: qty 30

## 2020-06-04 MED ORDER — METOPROLOL TARTRATE 25 MG/10 ML ORAL SUSPENSION: 37.5000 mg | Freq: Two times a day (BID) | ORAL | Status: DC

## 2020-06-04 MED ORDER — DEXTROSE 5 % IV SOLN
INTRAVENOUS | Status: AC | PRN
  Administered 2020-06-04: 150 mg via INTRAVENOUS

## 2020-06-04 MED ORDER — CLOPIDOGREL BISULFATE 75 MG PO TABS
75.0000 mg | ORAL_TABLET | Freq: Every day | ORAL | Status: DC
  Administered 2020-06-05 – 2020-06-10 (×5): 75 mg
  Filled 2020-06-04 (×6): qty 1

## 2020-06-04 MED ORDER — ATROPINE SULFATE 1 MG/ML IJ SOLN
INTRAMUSCULAR | Status: AC | PRN
  Administered 2020-06-04: .5 mg via INTRAVENOUS

## 2020-06-04 MED ORDER — HEPARIN SODIUM (PORCINE) 5000 UNIT/ML IJ SOLN: 5000.0000 [IU] | Freq: Three times a day (TID) | INTRAMUSCULAR | Status: DC

## 2020-06-04 MED ORDER — FENTANYL CITRATE (PF) 100 MCG/2ML IJ SOLN: 25.0000 ug | Freq: Once | INTRAMUSCULAR | Status: DC

## 2020-06-04 MED ORDER — SODIUM CHLORIDE 0.9% FLUSH: 3.0000 mL | INTRAVENOUS | Status: DC | PRN

## 2020-06-04 MED ORDER — ETOMIDATE 2 MG/ML IV SOLN
INTRAVENOUS | Status: AC | PRN
  Administered 2020-06-04: 20 mg via INTRAVENOUS

## 2020-06-04 MED ORDER — IOHEXOL 350 MG/ML SOLN
INTRAVENOUS | Status: AC
  Filled 2020-06-04: qty 1

## 2020-06-04 MED ORDER — METOPROLOL SUCCINATE ER 50 MG PO TB24: 75.0000 mg | ORAL_TABLET | Freq: Every day | ORAL | Status: DC

## 2020-06-04 MED ORDER — ATORVASTATIN CALCIUM 80 MG PO TABS: 80.0000 mg | ORAL_TABLET | Freq: Every day | ORAL | Status: DC

## 2020-06-04 MED ORDER — SODIUM CHLORIDE 0.9 % IV SOLN: INTRAVENOUS | Status: DC | PRN

## 2020-06-04 MED ORDER — SODIUM CHLORIDE 0.9% FLUSH
3.0000 mL | Freq: Two times a day (BID) | INTRAVENOUS | Status: DC
  Administered 2020-06-05 – 2020-06-14 (×15): 3 mL via INTRAVENOUS

## 2020-06-04 MED ORDER — SODIUM CHLORIDE 0.9 % IV SOLN: 250.0000 mL | INTRAVENOUS | Status: DC | PRN

## 2020-06-04 MED ORDER — AMIODARONE HCL IN DEXTROSE 360-4.14 MG/200ML-% IV SOLN
INTRAVENOUS | Status: AC
  Filled 2020-06-04: qty 200

## 2020-06-04 MED ORDER — MIDAZOLAM HCL 2 MG/2ML IJ SOLN
INTRAMUSCULAR | Status: DC | PRN
  Administered 2020-06-04: 2 mg via INTRAVENOUS

## 2020-06-04 MED ORDER — ATROPINE SULFATE 1 MG/10ML IJ SOSY
PREFILLED_SYRINGE | INTRAMUSCULAR | Status: AC
  Filled 2020-06-04: qty 10

## 2020-06-04 MED ORDER — POLYETHYLENE GLYCOL 3350 17 G PO PACK
17.0000 g | PACK | Freq: Every day | ORAL | Status: DC
  Administered 2020-06-05 – 2020-06-08 (×4): 17 g
  Filled 2020-06-04 (×6): qty 1

## 2020-06-04 MED ORDER — LIDOCAINE IN D5W 4-5 MG/ML-% IV SOLN
INTRAVENOUS | Status: AC | PRN
  Administered 2020-06-04: 1 mg/min via INTRAVENOUS

## 2020-06-04 MED ORDER — HEPARIN BOLUS VIA INFUSION
4000.0000 [IU] | Freq: Once | INTRAVENOUS | Status: AC
  Administered 2020-06-04: 4000 [IU] via INTRAVENOUS
  Filled 2020-06-04: qty 4000

## 2020-06-04 MED ORDER — EZETIMIBE 10 MG PO TABS: 10.0000 mg | ORAL_TABLET | Freq: Every day | ORAL | Status: DC

## 2020-06-04 MED ORDER — ATORVASTATIN CALCIUM 80 MG PO TABS
80.0000 mg | ORAL_TABLET | Freq: Every day | ORAL | Status: DC
  Administered 2020-06-04 – 2020-06-08 (×4): 80 mg
  Filled 2020-06-04 (×4): qty 1

## 2020-06-04 MED ORDER — CHLORHEXIDINE GLUCONATE CLOTH 2 % EX PADS
6.0000 | MEDICATED_PAD | Freq: Every day | CUTANEOUS | Status: DC
  Administered 2020-06-04 – 2020-06-24 (×19): 6 via TOPICAL

## 2020-06-04 MED ORDER — HEPARIN (PORCINE) IN NACL 1000-0.9 UT/500ML-% IV SOLN
INTRAVENOUS | Status: DC | PRN
  Administered 2020-06-04 (×2): 500 mL

## 2020-06-04 MED ORDER — ORAL CARE MOUTH RINSE
15.0000 mL | OROMUCOSAL | Status: DC
  Administered 2020-06-04 – 2020-06-09 (×46): 15 mL via OROMUCOSAL

## 2020-06-04 MED ORDER — CHLORHEXIDINE GLUCONATE 0.12% ORAL RINSE (MEDLINE KIT)
15.0000 mL | Freq: Two times a day (BID) | OROMUCOSAL | Status: DC
  Administered 2020-06-04 – 2020-06-09 (×11): 15 mL via OROMUCOSAL

## 2020-06-04 MED ORDER — DOCUSATE SODIUM 50 MG/5ML PO LIQD: 100.0000 mg | Freq: Two times a day (BID) | ORAL | Status: DC

## 2020-06-04 MED ORDER — ESCITALOPRAM OXALATE 10 MG PO TABS: 10.0000 mg | ORAL_TABLET | Freq: Every day | ORAL | Status: DC

## 2020-06-04 MED ORDER — ONDANSETRON HCL 4 MG/2ML IJ SOLN
4.0000 mg | Freq: Four times a day (QID) | INTRAMUSCULAR | Status: DC | PRN
  Administered 2020-06-09 – 2020-06-11 (×4): 4 mg via INTRAVENOUS
  Filled 2020-06-04 (×4): qty 2

## 2020-06-04 MED ORDER — ACETAMINOPHEN 325 MG PO TABS: 650.0000 mg | ORAL_TABLET | ORAL | Status: DC | PRN

## 2020-06-04 MED ORDER — PROPOFOL BOLUS VIA INFUSION
15.0000 mg | Freq: Once | INTRAVENOUS | Status: AC
  Administered 2020-06-04: 15 mg via INTRAVENOUS
  Filled 2020-06-04: qty 15

## 2020-06-04 MED ORDER — SODIUM CHLORIDE 0.9 % IV SOLN
INTRAVENOUS | Status: AC | PRN
  Administered 2020-06-04: 500 mL via INTRAVENOUS

## 2020-06-04 MED ORDER — ASPIRIN 81 MG PO CHEW: 81.0000 mg | CHEWABLE_TABLET | Freq: Every day | ORAL | Status: DC

## 2020-06-04 MED ORDER — CHLORHEXIDINE GLUCONATE CLOTH 2 % EX PADS
6.0000 | MEDICATED_PAD | Freq: Every day | CUTANEOUS | Status: DC
  Administered 2020-06-04 – 2020-06-12 (×4): 6 via TOPICAL

## 2020-06-04 SURGICAL SUPPLY — 20 items
CABLE ADAPT PACING TEMP 12FT (ADAPTER) ×2
CATH EXPO 5F MPA-1 (CATHETERS) ×2
CATH INFINITI 5 FR IM (CATHETERS) ×2
CATH INFINITI 5FR MULTPACK ANG (CATHETERS) ×2
CATH S G BIP PACING (CATHETERS) ×2
CATH SWAN GANZ VIP 7.5F (CATHETERS) ×2
ELECT DEFIB PAD ADLT CADENCE (PAD) ×2
GLIDESHEATH SLEND SS 6F .021 (SHEATH) ×2
KIT ENCORE 26 ADVANTAGE (KITS) ×2
KIT HEART LEFT (KITS) ×2
KIT MICROPUNCTURE NIT STIFF (SHEATH) ×2
MAT PREVALON FULL STRYKER (MISCELLANEOUS) ×2
PACK CARDIAC CATHETERIZATION (CUSTOM PROCEDURE TRAY) ×2
SHEATH PINNACLE 6F 10CM (SHEATH) ×4
SHEATH PINNACLE 8F 10CM (SHEATH) ×2
SLEEVE REPOSITIONING LENGTH 30 (MISCELLANEOUS) ×4
TRANSDUCER W/STOPCOCK (MISCELLANEOUS) ×2
TUBING CIL FLEX 10 FLL-RA (TUBING) ×2
WIRE EMERALD 3MM-J .035X150CM (WIRE) ×2
WIRE HI TORQ VERSACORE-J 145CM (WIRE) ×2

## 2020-06-04 NOTE — ED Notes (Signed)
Amnio already given. Repeat charting event. See [facility]

## 2020-06-04 NOTE — Progress Notes (Signed)
ANTICOAGULATION CONSULT NOTE - Initial Consult  Pharmacy Consult for heparin Indication: LV Thrombus  No Known Allergies  Patient Measurements: Height: 5\' 7"  (170.2 cm) Weight: 93.4 kg (205 lb 14.6 oz) IBW/kg (Calculated) : 66.1 Heparin Dosing Weight: 93kg  Vital Signs: Temp: 97.2 F (36.2 C) (11/21 1830) Temp Source: Core (11/21 1730) BP: 97/73 (11/21 1830) Pulse Rate: 80 (11/21 1830)  Labs: Recent Labs    06/04/20 1148 06/04/20 1803  HGB 17.3* 16.1  HCT 53.7* 47.3  PLT 297 341  CREATININE 1.78*  --   TROPONINIHS 157*  --     Estimated Creatinine Clearance: 44.5 mL/min (A) (by C-G formula based on SCr of 1.78 mg/dL (H)).   Medical History: Past Medical History:  Diagnosis Date  . Borderline diabetic   . Coronary artery disease    a. s/p CABG in 2014 with LIMA-LAD, SVG-OM2 and SVG-RC b. 03/2019: STEMI and s/p angioplasty to proximal SVG-RCA and DES to SVG-RCA anastomosis c. 04/2020: recurrent STEMI with angioplasty of SVG-RCA  . Dyslipidemia 01/11/2013  . Hiatal hernia   . Hypertension   . Mini stroke ([place])    2  . S/P CABG x 3   . STEMI (ST elevation myocardial infarction) ([place])   . Stroke [facility])     Assessment: 35 yoM admitted with VT now s/p LHC with stable coronary disease. Pharmacy asked to start IV heparin 8h after sheath removal (1800) with concerns for LV thrombus on prior ECHO.   Goal of Therapy:  Heparin level 0.3-0.7 units/ml Monitor platelets by anticoagulation protocol: Yes   Plan:  -Heparin 1200 units/h no bolus at 0200 11/22 -Check 6hr heparin level   Arrie Senate, PharmD, BCPS Clinical Pharmacist [phone] Please check AMION for all [place] numbers 06/04/2020

## 2020-06-04 NOTE — Code Documentation (Addendum)
Cardiology and RT at bedside-

## 2020-06-04 NOTE — Consult Note (Signed)
NAME:  [name], MRN:  [id number], DOB:  11-23-1955, LOS: 0 ADMISSION DATE:  06/04/2020, CONSULTATION DATE:  06/04/2020 REFERRING MD:  Dr. Haroldine Laws, CHIEF COMPLAINT:  VT cardiac arrest  Brief History   14 yoM with extensive cardiac history with recent STEMI 04/2020, with recurrent VT who developed severe bradycardia on amiodarone and lidocaine gtt requiring external pacing.  Intubated and emergently taken to cath lab for TVP, PA catheter, and R/LHC.  PCCM consulted for ventilator management.   History of present illness   HPI obtained from medical chart review as patient is sedated and intubated on mechanical ventilation.   64 year old male with history of CAD w/ previous CABG in 2014, chronic combined systolic and diastolic HF/ ICM, HTN, HLD, CVA, and recent inferior STEMI 05/03/20 secondary to thrombotic occlusion of the SVG to the RCA, likely due to missed ticagrelor doses.  EF noted to be 25% with moderate RV to severe RV dysfunction and possible LV thrombus 05/04/2020.  He was discharged home on 10/23 on a LifeVest, but apparently not using.    On 11/21, he developed dizziness at church.  EMS found patient in wide complex tachycardia treated with amiodarone and lidocaine with successful conversion to NSR.  On arrival to ER, patient had recurrence of his VT but hemodynamically tolerating.  He was given additional amiodarone and lidocaine gtt started, however developed severe bradycardia requiring atropine and external pacing.  He was intubated and taken emergently for temporary transvenous pacemaker and coronary angiogram.  Returns to ICU sedated and intubated, PCCM consulted for further ventilator management.   Past Medical History  CAD (s/p CABG in 2014 with LIMA-LAD, SVG-OM2 and SVG-RCA, s/p angioplasty to proximal SVG-RCA and DES to SVG-RCA anastomosisin 03/2019), chronic combined systolic and diastolic CHF/ICM (EF 06-26% by echo in 06/2019), HTN, HLD,prior CVA  Significant  Hospital Events   11/21 Admitted cardiology/ intubated/ L/RHC, TVP, PA catheter  Consults:  HF CCM  Procedures:  11/21 ETT >> 11/21 R IJ cordis with PA catheter >> 11/21 R femoral TVP >> 11/21 R radial aline >>  Significant Diagnostic Tests:  11/21 [facility] >>  Mid LAD lesion is 100% stenosed.  Prox Cx lesion is 100% stenosed.  Prox RCA lesion is 100% stenosed.  Dist RCA lesion is 20% stenosed.  Non-stenotic Origin to Prox Graft lesion was previously treated.  Prox Graft lesion is 30% stenosed.  LV end diastolic pressure is mildly elevated.   1. Severe 3 vessel occlusive CAD. 2. Patent LIMA to the LAD 3. Patent SVG to OM1 4. Patent SVG to RCA. Much improved flow from prior PCI. 5. Mildly elevated LVEDP 17 mm Hg 6. Successful placement of temporary transvenous pacemaker.  Micro Data:  11/21 SARS2/ flu >>neg  Antimicrobials:  n/a  Interim history/subjective:  Currently on propofol/ fentanyl NE at 15 mcg/min, remains on amio gtt, lido gtt d/c'd Ongoing TVP/ VVI  Objective   Blood pressure (!) 164/144, pulse (!) 150, temperature (!) 97.2 F (36.2 C), resp. rate 18, height 5\' 7"  (1.702 m), weight 93.4 kg, SpO2 100 %.    Vent Mode: PRVC FiO2 (%):  [100 %] 100 % Set Rate:  [18 bmp] 18 bmp Vt Set:  [530 mL] 530 mL PEEP:  [5 cmH20] 5 cmH20 Plateau Pressure:  [15 cmH20] 15 cmH20   Intake/Output Summary (Last 24 hours) at 06/04/2020 1623 Last data filed at 06/04/2020 1321 Gross per 24 hour  Intake 746.11 ml  Output --  Net 746.11 ml  Filed Weights   06/04/20 1135  Weight: 93.4 kg   Examination: General:  Critically ill older appearing male sedated and intubated on MV, intermittent shivering HEENT: MM pink/moist, ETT/ OGT, poor dentition, pupils 3/sluggish, anicteric  Neuro: sedated, is spont moving extremities, not f/c CV: paced, distant heart sounds PULM:  MV supported breaths, coarse throughout  GI: soft, bs+, foley- being placed Extremities:  warm/dry, no LE edema  Skin: no rashes  Resolved Hospital Problem list    Assessment & Plan:   Acute hypoxic respiratory failure - full MV support, PRVC 8 cc/kg  - wean FiO2/PEEP for sat goal > 94% - VAP bundle/ PPI - intermittent CXR  - ABG now - PAD protocol with propofol/ fentanyl gtt for RASS goal -2/-3 w/ bowel regimen   Recurrent VT Cardiogenic shock Chronic systolic/ diastolic HF with biventricular failure ICM  CAD s/p CABG with recent inferior STEMI 04/2020 HTN - s/p emergent R/LHC with patent grafts, preserved output and compensated filling pressures - TVP/ PA hemodynamics per Cardiology / HF team/ EP - NE for MAP goal > 65 - goal K > 4/ Mag > 2 - repeat TTE ordered  - DAPT/ statin    Possible LV thrombus, based on 05/04/20 TTE - repeat TTE - heparin gtt to start 8 hrs post cath   AKI on CKD stage III, baseline sCr 1.3-1.4 - foley - hemodynamic support as above - trend UOP/ renal indices - avoid nephrotoxins   Best practice:  Diet: NPO Pain/Anxiety/Delirium protocol (if indicated): propofol/ fentanyl gtt VAP protocol (if indicated): yes DVT prophylaxis: to be started on heparin gtt 8 hrs post cath GI prophylaxis: PPI Glucose control: add SSI if > 180 Mobility: BR Code Status: full  Family Communication: per primary  Disposition: ICU  Labs   CBC: Recent Labs  Lab 06/04/20 1148  WBC 6.5  NEUTROABS 1.5*  HGB 17.3*  HCT 53.7*  MCV 95.2  PLT 638    Basic Metabolic Panel: Recent Labs  Lab 06/04/20 1148  NA 139  K 4.2  CL 104  CO2 22  GLUCOSE 143*  BUN 20  CREATININE 1.78*  CALCIUM 9.0   GFR: Estimated Creatinine Clearance: 44.5 mL/min (A) (by C-G formula based on SCr of 1.78 mg/dL (H)). Recent Labs  Lab 06/04/20 1148  WBC 6.5    Liver Function Tests: Recent Labs  Lab 06/04/20 1148  AST 26  ALT 22  ALKPHOS 66  BILITOT 1.1  PROT 7.3  ALBUMIN 3.9   No results for input(s): LIPASE, AMYLASE in the last 168 hours. No  results for input(s): AMMONIA in the last 168 hours.  ABG    Component Value Date/Time   PHART 7.278 (L) 03/24/2019 0024   PCO2ART 50.1 (H) 03/24/2019 0024   PO2ART 342.0 (H) 03/24/2019 0024   HCO3 23.3 03/24/2019 0024   TCO2 17 (L) 05/03/2020 2321   ACIDBASEDEF 4.0 (H) 03/24/2019 0024   O2SAT 100.0 03/24/2019 0024     Coagulation Profile: No results for input(s): INR, PROTIME in the last 168 hours.  Cardiac Enzymes: No results for input(s): CKTOTAL, CKMB, CKMBINDEX, TROPONINI in the last 168 hours.  HbA1C: Hgb A1c MFr Bld  Date/Time Value Ref Range Status  05/03/2020 11:30 PM 6.1 (H) 4.8 - 5.6 % Final    Comment:    (NOTE)         Prediabetes: 5.7 - 6.4         Diabetes: >6.4  Glycemic control for adults with diabetes: <7.0   03/25/2019 02:39 AM 5.6 4.8 - 5.6 % Final    Comment:    (NOTE)         Prediabetes: 5.7 - 6.4         Diabetes: >6.4         Glycemic control for adults with diabetes: <7.0     CBG: No results for input(s): GLUCAP in the last 168 hours.  Review of Systems:   Unable   Past Medical History  He,  has a past medical history of Borderline diabetic, Coronary artery disease, Dyslipidemia (01/11/2013), Hiatal hernia, Hypertension, Mini stroke ([place]), S/P CABG x 3, STEMI (ST elevation myocardial infarction) ([place]), and Stroke ([place]).   Surgical History    Past Surgical History:  Procedure Laterality Date  . CARDIAC CATHETERIZATION    . CORONARY ARTERY BYPASS GRAFT N/A 09/01/2012   Procedure: CORONARY ARTERY BYPASS GRAFTING (CABG);  Surgeon: Gaye Pollack, MD;  Location: [place];  Service: Open Heart Surgery;  Laterality: N/A;  Coronary Artery Bypass Grafting Times Three Using Left Internal Mammary Artery and Left Saphenous leg Vein Harvested Endoscopically  . CORONARY STENT INTERVENTION N/A 03/23/2019   Procedure: CORONARY STENT INTERVENTION;  Surgeon: Lorretta Harp, MD;  Location: [place] CV LAB;  Service: Cardiovascular;  Laterality:  N/A;  . CORONARY/GRAFT ACUTE MI REVASCULARIZATION N/A 03/23/2019   Procedure: Coronary/Graft Acute MI Revascularization;  Surgeon: Lorretta Harp, MD;  Location: [place] CV LAB;  Service: Cardiovascular;  Laterality: N/A;  . CORONARY/GRAFT ACUTE MI REVASCULARIZATION N/A 05/03/2020   Procedure: Coronary/Graft Acute MI Revascularization;  Surgeon: Belva Crome, MD;  Location: [place] CV LAB;  Service: Cardiovascular;  Laterality: N/A;  . INTRAOPERATIVE TRANSESOPHAGEAL ECHOCARDIOGRAM N/A 09/01/2012   Procedure: INTRAOPERATIVE TRANSESOPHAGEAL ECHOCARDIOGRAM;  Surgeon: Gaye Pollack, MD;  Location: [place] OR;  Service: Open Heart Surgery;  Laterality: N/A;  . LEFT HEART CATH AND CORONARY ANGIOGRAPHY N/A 03/23/2019   Procedure: LEFT HEART CATH AND CORONARY ANGIOGRAPHY;  Surgeon: Lorretta Harp, MD;  Location: [place] CV LAB;  Service: Cardiovascular;  Laterality: N/A;  . LEFT HEART CATH AND CORONARY ANGIOGRAPHY N/A 05/03/2020   Procedure: LEFT HEART CATH AND CORONARY ANGIOGRAPHY;  Surgeon: Belva Crome, MD;  Location: [place] CV LAB;  Service: Cardiovascular;  Laterality: N/A;  . LEFT HEART CATHETERIZATION WITH CORONARY ANGIOGRAM N/A 08/31/2012   Procedure: LEFT HEART CATHETERIZATION WITH CORONARY ANGIOGRAM;  Surgeon: Peter M Martinique, MD;  Location[facility] CATH LAB;  Service: Cardiovascular;  Laterality: N/A;  . PERCUTANEOUS CORONARY STENT INTERVENTION (PCI-S) N/A 08/31/2012   Procedure: PERCUTANEOUS CORONARY STENT INTERVENTION (PCI-S);  Surgeon: Peter M Martinique, MD;  Location: [facility] CATH LAB;  Service: Cardiovascular;  Laterality: N/A;     Social History   reports that he has quit smoking. His smoking use included pipe. He quit smokeless tobacco use about 51 years ago. He reports that he does not drink alcohol and does not use drugs.   Family History   His family history includes Cancer in his father; Heart attack in his brother, father, and sister; Hyperlipidemia in his brother and father;  Hypertension in his brother, father, and sister.   Allergies No Known Allergies   Home Medications  Prior to Admission medications   Medication Sig Start Date End Date Taking? Authorizing Provider  aspirin 81 MG chewable tablet Chew 81 mg by mouth daily.   Yes [provider]  atorvastatin (LIPITOR) 80 MG tablet TAKE 1  TABLET (80 MG TOTAL) BY MOUTH DAILY AT 6 PM. 08/31/19  Yes Kilroy, Luke K, PA-C  metoprolol succinate (TOPROL-XL) 50 MG 24 hr tablet Take 1.5 tablets (75 mg total) by mouth daily. Take with or immediately following a meal. 05/06/20  Yes Strader, [place] M, PA-C  nitroGLYCERIN (NITROSTAT) 0.4 MG SL tablet Place 1 tablet (0.4 mg total) under the tongue every 5 (five) minutes x 3 doses as needed for chest pain. 05/06/20  Yes Strader, [place] M, PA-C  Omega-3 Fatty Acids (FISH OIL) 1000 MG CAPS Take 1,000 mg by mouth in the morning and at bedtime.   Yes [provider]  sacubitril-valsartan (ENTRESTO) 49-51 MG Take 1 tablet by mouth 2 (two) times daily. 05/26/20  Yes Croitoru, Mihai, MD  ALPRAZolam (XANAX) 0.5 MG tablet Take 1 tablet (0.5 mg total) by mouth at bedtime as needed for anxiety. 05/26/20   Croitoru, Mihai, MD  clopidogrel (PLAVIX) 75 MG tablet Take 1 tablet (75 mg total) by mouth daily. 05/07/20   Strader, Fransisco Hertz, PA-C  escitalopram (LEXAPRO) 10 MG tablet Take 1 tablet (10 mg total) by mouth daily. 05/26/20   Croitoru, Mihai, MD  ezetimibe (ZETIA) 10 MG tablet Take 1 tablet (10 mg total) by mouth daily. 06/25/19 05/26/20  Croitoru, Dani Gobble, MD     Critical care time: 39 mins     Kennieth Rad, ACNP [facility] 06/04/2020, 5:54 PM  See Amion for personal pager PCCM on call pager [phone]

## 2020-06-04 NOTE — H&P (Signed)
Cardiology Admission History and Physical:   Patient ID: [name] MRN: [id number]; DOB: Dec 03, 1955   Admission date: 06/04/2020  Primary Care Provider: [name], MD [facility] Cardiologist: Sanda Klein, MD   Chief Complaint: Palpitations/SVT  Patient Profile:   [name] is a 64 y.o. male with a PMH of CAD (s/p CABG in 2014 with LIMA-LAD, SVG-OM2 and SVG-RCA, s/p angioplasty to proximal SVG-RCA and DES to SVG-RCA anastomosis in 03/2019), chronic combined systolic and diastolic CHF/ICM (EF 40-98% by echo in 06/2019), HTN, HLD, prior CVA and recent STEMI with angioplasty of the distal bypass graft 05/04/20 who is presenting from home with palpitations and dizziness found to be in VT.   History of Present Illness:   [name] is a 64 yo with a hx as stated above who presented to [facility] 06/04/20 after having a pre-syncopal symptoms including dizzinezss while at church earlier today found to be in VT (not wearing Life Vest as prescribed on recent hospital discharge) on EMS arrival. He reports that his symptoms actually began yesterday evening however symptoms were noted to be mild therefore he did not seek medical assistance. He then was at church with his wife earlier today when his symptoms worsened therefore EMS was called and he was found to be in wide complex tachycardia with a rate at 160bpm. He was given amiodarone bolus with an additional bolus with poor reposnse. He was then given a lidocaine push with conversion to NSR/SB with ectopy. He is currently maintaining ST with rates in the 120 range. He was started on IV Heparin and was given IVF hydration. He was continued on IV amiodarone. He denies chest pain or SOB. No HF symptoms including orthopnea, LE edema or PND. There was no LOC. He reports compliance with his medications. He has not been wearing his Life Vest.   He was recently seen and discharged by our service 05/06/20 after presenting as a STEMI. He reported  sudden chest pain starting the evening of admission and EKG by EMS showed ST elevation along the inferior leads and he was taken for emergent cardiac catheterization. He reported being without his medications for at least 3 days.   LHC performed 05/04/20 that showed a thrombotic occlusion of the saphenous vein graft to the right coronary, with probable late stent thrombosis after missing Brilinta for 3 days. He underwent successful angioplasty with reperfusion of the inferior wall reducing the 100% stenosis in the distal bypass graft and native vessel anastomosis reducing the stenosis to 50% with TIMI grade III flow. He has diffuse proximal to mid 70% stenosis in the vein graft to the obtuse marginal and LIMA-LAD was patent. It was recommended that he as continue ASA and Plavix indefinitely.    Repeat echocardiogram showed an EF at 25-30%. The report mentioned concern for left ventricular apical thrombus but this was reviewed by Dr. Sallyanne Kuster and felt to be most consistent with trabeculation/false tendon, not a thrombus. It was recommended he have a LifeVest at the time of hospital discharge with plans for reevaluation of LVEF 3 months after  revascularization.   LifeVest was placed on 05/06/2020 and he was discharged on Toprol-XL 75mg  daily and Entresto 24-26mg  BID for his cardiomyopathy. He was not  placed on Spironolactone given issues with recurrent hyperkalemia in the past.   He was then seen in follow up with Dr. Sallyanne Kuster 05/26/20 at which time he reported being compliant with his ASA and Plavix however he was noted to not be wearing his  Life Vest as he did not like the bulky battery pack.   Past Medical History:  Diagnosis Date  . Borderline diabetic   . Coronary artery disease    a. s/p CABG in 2014 with LIMA-LAD, SVG-OM2 and SVG-RC b. 03/2019: STEMI and s/p angioplasty to proximal SVG-RCA and DES to SVG-RCA anastomosis c. 04/2020: recurrent STEMI with angioplasty of SVG-RCA  . Dyslipidemia  01/11/2013  . Hiatal hernia   . Hypertension   . Mini stroke ([place])    2  . S/P CABG x 3   . STEMI (ST elevation myocardial infarction) ([place])   . Stroke [facility])     Past Surgical History:  Procedure Laterality Date  . CARDIAC CATHETERIZATION    . CORONARY ARTERY BYPASS GRAFT N/A 09/01/2012   Procedure: CORONARY ARTERY BYPASS GRAFTING (CABG);  Surgeon: Gaye Pollack, MD;  Location: [place];  Service: Open Heart Surgery;  Laterality: N/A;  Coronary Artery Bypass Grafting Times Three Using Left Internal Mammary Artery and Left Saphenous leg Vein Harvested Endoscopically  . CORONARY STENT INTERVENTION N/A 03/23/2019   Procedure: CORONARY STENT INTERVENTION;  Surgeon: Lorretta Harp, MD;  Location: [place] CV LAB;  Service: Cardiovascular;  Laterality: N/A;  . CORONARY/GRAFT ACUTE MI REVASCULARIZATION N/A 03/23/2019   Procedure: Coronary/Graft Acute MI Revascularization;  Surgeon: Lorretta Harp, MD;  Location: [place] CV LAB;  Service: Cardiovascular;  Laterality: N/A;  . CORONARY/GRAFT ACUTE MI REVASCULARIZATION N/A 05/03/2020   Procedure: Coronary/Graft Acute MI Revascularization;  Surgeon: Belva Crome, MD;  Location: [place] CV LAB;  Service: Cardiovascular;  Laterality: N/A;  . INTRAOPERATIVE TRANSESOPHAGEAL ECHOCARDIOGRAM N/A 09/01/2012   Procedure: INTRAOPERATIVE TRANSESOPHAGEAL ECHOCARDIOGRAM;  Surgeon: Gaye Pollack, MD;  Location: [place] OR;  Service: Open Heart Surgery;  Laterality: N/A;  . LEFT HEART CATH AND CORONARY ANGIOGRAPHY N/A 03/23/2019   Procedure: LEFT HEART CATH AND CORONARY ANGIOGRAPHY;  Surgeon: Lorretta Harp, MD;  Location: [place] CV LAB;  Service: Cardiovascular;  Laterality: N/A;  . LEFT HEART CATH AND CORONARY ANGIOGRAPHY N/A 05/03/2020   Procedure: LEFT HEART CATH AND CORONARY ANGIOGRAPHY;  Surgeon: Belva Crome, MD;  Location: [place] CV LAB;  Service: Cardiovascular;  Laterality: N/A;  . LEFT HEART CATHETERIZATION WITH CORONARY ANGIOGRAM N/A  08/31/2012   Procedure: LEFT HEART CATHETERIZATION WITH CORONARY ANGIOGRAM;  Surgeon: Peter M Martinique, MD;  Location: [facility] CATH LAB;  Service: Cardiovascular;  Laterality: N/A;  . PERCUTANEOUS CORONARY STENT INTERVENTION (PCI-S) N/A 08/31/2012   Procedure: PERCUTANEOUS CORONARY STENT INTERVENTION (PCI-S);  Surgeon: Peter M Martinique, MD;  Location: [facility] CATH LAB;  Service: Cardiovascular;  Laterality: N/A;     Medications Prior to Admission: Prior to Admission medications   Medication Sig Start Date End Date Taking? Authorizing Provider  ALPRAZolam Duanne Moron) 0.5 MG tablet Take 1 tablet (0.5 mg total) by mouth at bedtime as needed for anxiety. 05/26/20   Croitoru, Mihai, MD  aspirin 81 MG chewable tablet Chew 81 mg by mouth daily.    [provider]  atorvastatin (LIPITOR) 80 MG tablet TAKE 1 TABLET (80 MG TOTAL) BY MOUTH DAILY AT 6 PM. 08/31/19   Kilroy, Doreene Burke, PA-C  clopidogrel (PLAVIX) 75 MG tablet Take 1 tablet (75 mg total) by mouth daily. 05/07/20   Strader, Fransisco Hertz, PA-C  escitalopram (LEXAPRO) 10 MG tablet Take 1 tablet (10 mg total) by mouth daily. 05/26/20   Croitoru, Mihai, MD  ezetimibe (ZETIA) 10 MG tablet Take 1 tablet (10 mg total) by mouth daily.  06/25/19 05/26/20  Croitoru, Mihai, MD  metoprolol succinate (TOPROL-XL) 50 MG 24 hr tablet Take 1.5 tablets (75 mg total) by mouth daily. Take with or immediately following a meal. 05/06/20   Strader, [place] M, PA-C  Multiple Vitamin (MULTIVITAMIN WITH MINERALS) TABS tablet Take 1 tablet by mouth daily.    [provider]  nitroGLYCERIN (NITROSTAT) 0.4 MG SL tablet Place 1 tablet (0.4 mg total) under the tongue every 5 (five) minutes x 3 doses as needed for chest pain. 05/06/20   Strader, Fransisco Hertz, PA-C  Omega-3 Fatty Acids (FISH OIL) 1000 MG CAPS Take 1,000 mg by mouth in the morning and at bedtime.    [provider]  sacubitril-valsartan (ENTRESTO) 49-51 MG Take 1 tablet by mouth 2 (two) times daily. 05/26/20    Croitoru, Dani Gobble, MD     Allergies:   No Known Allergies  Social History:   Social History   Socioeconomic History  . Marital status: Married    Spouse name: Not on file  . Number of children: Not on file  . Years of education: Not on file  . Highest education level: Not on file  Occupational History  . Not on file  Tobacco Use  . Smoking status: Former Smoker    Types: Pipe  . Smokeless tobacco: Former Systems developer    Quit date: 07/15/1968  Substance and Sexual Activity  . Alcohol use: No  . Drug use: No  . Sexual activity: Not on file  Other Topics Concern  . Not on file  Social History Narrative  . Not on file   Social Determinants of Health   Financial Resource Strain:   . Difficulty of Paying Living Expenses: Not on file  Food Insecurity:   . Worried About Charity fundraiser in the Last Year: Not on file  . Ran Out of Food in the Last Year: Not on file  Transportation Needs:   . Lack of Transportation (Medical): Not on file  . Lack of Transportation (Non-Medical): Not on file  Physical Activity:   . Days of Exercise per Week: Not on file  . Minutes of Exercise per Session: Not on file  Stress:   . Feeling of Stress : Not on file  Social Connections:   . Frequency of Communication with Friends and Family: Not on file  . Frequency of Social Gatherings with Friends and Family: Not on file  . Attends Religious Services: Not on file  . Active Member of Clubs or Organizations: Not on file  . Attends Archivist Meetings: Not on file  . Marital Status: Not on file  Intimate Partner Violence:   . Fear of Current or Ex-Partner: Not on file  . Emotionally Abused: Not on file  . Physically Abused: Not on file  . Sexually Abused: Not on file    Family History:   The patient's family history includes Cancer in his father; Heart attack in his brother, father, and sister; Hyperlipidemia in his brother and father; Hypertension in his brother, father, and sister.     ROS:  Please see the history of present illness.  All other ROS reviewed and negative.     Physical Exam/Data:   Vitals:   06/04/20 1126 06/04/20 1130 06/04/20 1135 06/04/20 1145  BP:  140/62 98/78 94/70   Pulse:  (!) 145 73 (!) 53  Resp:  16 18 16   Temp:  (!) 97.2 F (36.2 C)    SpO2: 98% 98% 98% 98%  Weight:  93.4 kg   Height:   5\' 7"  (1.702 m)    No intake or output data in the 24 hours ending 06/04/20 1202 Last 3 Weights 06/04/2020 05/26/2020 05/06/2020  Weight (lbs) 205 lb 14.6 oz 206 lb 198 lb 4.8 oz  Weight (kg) 93.4 kg 93.441 kg 89.948 kg     Body mass index is 32.25 kg/m.   General: Well developed, well nourished, NAD Neck: Negative for carotid bruits. No JVD Lungs:Clear to ausculation bilaterally. No wheezes, rales, or rhonchi. Breathing is unlabored. Cardiovascular: RRR with S1 S2. No murmurs Abdomen: Soft, non-tender, non-distended. No obvious abdominal masses. Extremities: No edema. Radial pulses 2+ bilaterally Neuro: Alert and oriented. No focal deficits. No facial asymmetry. MAE spontaneously. Psych: Responds to questions appropriately with normal affect.     EKG:  The ECG that was done 06/04/20 was personally reviewed and demonstrates VT at a rate of 158bpm  Relevant CV Studies:  CATH 05/03/2020:  Acute inferior STEMI related to thrombotic occlusion of the saphenous vein graft to the right coronary, with probable late stent thrombosis after missing Brilinta for 3 days.  Successful angioplasty with reperfusion of the inferior wall reducing the 100% stenosis in the distal bypass graft and native vessel anastomosis, using balloon angioplasty reducing the stenosis to 50% with TIMI grade III flow.  Diffuse proximal to mid 70% stenosis in the vein graft to the obtuse marginal  Patent LIMA to LAD  Totally occluded proximal RCA  Totally occluded proximal to mid circumflex  Totally occluded mid LAD. Ostial 60% first diagonal.  Widely patent left  main  LVEDP was not obtained as the patient developed ventricular tachycardia upon entry into the LV with the Judkins right catheter.  RECOMMENDATIONS:   IV nitroglycerin for blood pressure, improvement in microcirculatory flow, and to reduce LV end-diastolic pressures.  Aspirin and Plavix should be continued indefinitely.  Consider coronary bypass grafting of the to degenerated vein conduits as noted above.  Risk factor modification  ECHO 05/03/2020 1. Since the last study on 06/23/2019 LVEF has decreased from 35-40% to  25-30%, there is a suspicion for left ventricular apical thrombus  measuring 14 x 6 mm.  2. Left ventricular ejection fraction, by estimation, is 25 to 30%. The  left ventricle has severely decreased function. The left ventricle  demonstrates global hypokinesis. The left ventricular internal cavity size  was moderately dilated. Left  ventricular diastolic parameters are consistent with Grade I diastolic  dysfunction (impaired relaxation). Elevated left atrial pressure.  3. Right ventricular systolic function is moderately reduced. The right  ventricular size is moderately enlarged. There is mildly elevated  pulmonary artery systolic pressure. The estimated right ventricular  systolic pressure is 84.6 mmHg.  4. Left atrial size was moderately dilated.  5. Right atrial size was moderately dilated.  6. The mitral valve is normal in structure. Mild mitral valve  regurgitation. No evidence of mitral stenosis.  7. The aortic valve is normal in structure. Aortic valve regurgitation is  mild. No aortic stenosis is present.  8. The inferior vena cava is normal in size with greater than 50%  respiratory variability, suggesting right atrial pressure of 3 mmHg.  Laboratory Data:  High Sensitivity Troponin:  No results for input(s): TROPONINIHS in the last 720 hours.    ChemistryNo results for input(s): NA, K, CL, CO2, GLUCOSE, BUN, CREATININE, CALCIUM,  GFRNONAA, GFRAA, ANIONGAP in the last 168 hours.  No results for input(s): PROT, ALBUMIN, AST, ALT, ALKPHOS, BILITOT in the last 168  hours. HematologyNo results for input(s): WBC, RBC, HGB, HCT, MCV, MCH, MCHC, RDW, PLT in the last 168 hours. BNPNo results for input(s): BNP, PROBNP in the last 168 hours.  DDimer No results for input(s): DDIMER in the last 168 hours.  Radiology/Studies:  No results found.  Assessment and Plan:   1. Wide complex tachycardia: -Patient recently treated for an inferior STEMI with LHC performed 05/04/2020 which showed a thrombotic occlusion of the saphenous vein graft to the right coronary, with probable late stent thrombosis after missing Brilinta for 3 days. He underwent successful angioplasty with reperfusion of the inferior wall reducing the 100% stenosis in the distal bypass graft and native vessel anastomosis reducing the stenosis to 50% with TIMI grade III flow. He has diffuse proximal to mid 70% stenosis in the vein graft to the obtuse marginal and LIMA-LAD was patent.  Plan was for ASA and Plavix indefinitely.   -Repeat echocardiogram showed an EF at 25-30%.  Given severely reduced LVEF a LifeVest was placed prior to discharge with plans for reevaluation in 3 months after revascularization.  Unfortunately, he was seen in follow-up and was not wearing his LifeVest as recommended due to the bulky battery.  The evening prior to ED presentation, patient reports mild symptoms of dizziness.  He then proceeded to church this morning and reports acute onset of dizziness with no LOC.  EMS was called and patient was found to be in VT. -Pt treated with IV amiodarone with recurrent VT therefore IV Lidocaine was administered with conversion to NSR/SB. Unfortunately on exam, he has persistent VT but remains fairly asymptomatic  -Plan is transfer to [place] and continue IV lidocaine  -Will place PA catheter once in ICU  -Continue PTA metoprolol  -Stop Entresto due to soft  BPs -Once VT stabilized, plan for ICD placement    2. CAD s/p CABG with recent STEMI: -s/p with POBA angioplasty 05/04/20 with plans for aspirin and clopidogrel indefinitely given his complex CAD history.  There was a plan to reevaluate in a few months to see if he may need a redo CABG with arterial grafts.  No stent was placed this admission so dual antiplatelet therapy could be interrupted for surgery if indicated.  LDL was 114 on admission.  He has been on atorvastatin and Zetia as an outpatient.  Would switch Zetia to PCSK9 inhibitor as an outpatient.   3. Ischemic cardiomyopathy: -Echocardiogram performed 05/03/2020 with LVEF at 25 to 30%, G1 DD, moderately reduced RV, moderately dilated LA, moderately dilated RA, mild MR and mild AR. -He has been treated with GDMT including Entresto 49/51, ASA 81, atorvastatin 80, Toprol 50 -Hold Entresto for now given soft BPs on presentation -Does not appear fluid volume overloaded on exam  4.  HTN: -Stable, 90/75, 112/93, 100/79 -Continue current regimen however will hold Entresto for now  5.  CKD stage III: -Creatinine, 1.78 today with a baseline that appears to be in the 1.3-1.4 range -Continue to avoid nephrotoxic medications -Hold Entresto and follow kidney function closely  6.  Prediabetes: -Hemoglobin A1c, 6.1 on most recent lab work -Follow with PCP    Severity of Illness: The appropriate patient status for this patient is INPATIENT. Inpatient status is judged to be reasonable and necessary in order to provide the required intensity of service to ensure the patient's safety. The patient's presenting symptoms, physical exam findings, and initial radiographic and laboratory data in the context of their chronic comorbidities is felt to place them at high risk for  further clinical deterioration. Furthermore, it is not anticipated that the patient will be medically stable for discharge from the hospital within 2 midnights of admission. The  following factors support the patient status of inpatient.   " The patient's presenting symptoms include VT. " The worrisome physical exam findings include VT " The initial radiographic and laboratory data are worrisome because of VT. " The chronic co-morbidities include CAD, NSVT   * I certify that at the point of admission it is my clinical judgment that the patient will require inpatient hospital care spanning beyond 2 midnights from the point of admission due to high intensity of service, high risk for further deterioration and high frequency of surveillance required.*    For questions or updates, please contact [place] Please consult [email or web address] for contact info under     Signed, Kathyrn Drown, NP  06/04/2020 12:02 PM

## 2020-06-04 NOTE — ED Provider Notes (Signed)
[facility] EMERGENCY DEPARTMENT Provider Note   CSN: [id number] Arrival date & time: 06/04/20  1122     History Chief Complaint  Patient presents with  . Dizziness    Vtach    [name] is a 64 y.o. male.  HPI      This patient is a 64 year old male, he has known history of coronary disease with a bypass graft in 2014 followed by stenting in 2021 after he had a recurrent ST elevation myocardial infarction.  He has borderline diabetes, hypertension and hypercholesterolemia.  He has never smoked cigarettes.  He was prescribed a LifeVest when he was discharged from the hospital in October but has not been compliant with it, he states he does not like carrying the battery around.  The patient reports that yesterday night he started to feel lightheaded and dizzy, it was only mild, when he went to church today he felt acute onset of worsening lightheadedness, called the paramedics who found the patient to be in ventricular tachycardia and started an amiodarone drip without improvement.  He had pulses, he was awake and alert with a low blood pressure, he arrives to the hospital able to answer my questions.  His symptoms are persistent severe and not associated with any chest pain, no fevers, no nausea vomiting or diarrhea.  He reports he has been compliant with his other medications.  Past Medical History:  Diagnosis Date  . Borderline diabetic   . Coronary artery disease    a. s/p CABG in 2014 with LIMA-LAD, SVG-OM2 and SVG-RC b. 03/2019: STEMI and s/p angioplasty to proximal SVG-RCA and DES to SVG-RCA anastomosis c. 04/2020: recurrent STEMI with angioplasty of SVG-RCA  . Dyslipidemia 01/11/2013  . Hiatal hernia   . Hypertension   . Mini stroke ([place])    2  . S/P CABG x 3   . STEMI (ST elevation myocardial infarction) ([place])   . Stroke [facility])     Patient Active Problem List   Diagnosis Date Noted  . NSVT (nonsustained ventricular tachycardia) ([place]) 05/26/2020   . Heart attack ([place]) 05/04/2020  . Coronary artery disease involving coronary bypass graft of native heart without angina pectoris 06/25/2019  . Chronic combined systolic and diastolic heart failure ([place]) 06/25/2019  . Dilated aortic root ([place]) 06/25/2019  . CAD S/P urgent PCI 03/23/2019 04/22/2019  . Ischemic cardiomyopathy 04/22/2019  . Aortic root enlargement ([place]) 04/22/2019  . Stage 3a chronic kidney disease ([place]) 04/22/2019  . Hyperkalemia 04/22/2019  . Acute respiratory failure with hypoxia ([place])   . Altered mental status 03/23/2019  . Status post CVA 02/03/2013  . Weakness 01/11/2013  . Dyslipidemia 01/11/2013  . Hx of CABG 09/02/2012  . ST elevation myocardial infarction (STEMI) ([place]) 09/01/2012  . Essential hypertension   . Prediabetes   . Hiatal hernia     Past Surgical History:  Procedure Laterality Date  . CARDIAC CATHETERIZATION    . CORONARY ARTERY BYPASS GRAFT N/A 09/01/2012   Procedure: CORONARY ARTERY BYPASS GRAFTING (CABG);  Surgeon: Gaye Pollack, MD;  Location: [facility];  Service: Open Heart Surgery;  Laterality: N/A;  Coronary Artery Bypass Grafting Times Three Using Left Internal Mammary Artery and Left Saphenous leg Vein Harvested Endoscopically  . CORONARY STENT INTERVENTION N/A 03/23/2019   Procedure: CORONARY STENT INTERVENTION;  Surgeon: Lorretta Harp, MD;  Location: [facility] CV LAB;  Service: Cardiovascular;  Laterality: N/A;  . CORONARY/GRAFT ACUTE MI REVASCULARIZATION N/A 03/23/2019   Procedure: Coronary/Graft Acute MI Revascularization;  Surgeon: Lorretta Harp, MD;  Location: [place] CV LAB;  Service: Cardiovascular;  Laterality: N/A;  . CORONARY/GRAFT ACUTE MI REVASCULARIZATION N/A 05/03/2020   Procedure: Coronary/Graft Acute MI Revascularization;  Surgeon: Belva Crome, MD;  Location: [facility] CV LAB;  Service: Cardiovascular;  Laterality: N/A;  . INTRAOPERATIVE TRANSESOPHAGEAL ECHOCARDIOGRAM N/A 09/01/2012   Procedure: INTRAOPERATIVE  TRANSESOPHAGEAL ECHOCARDIOGRAM;  Surgeon: Gaye Pollack, MD;  Location: [facility] OR;  Service: Open Heart Surgery;  Laterality: N/A;  . LEFT HEART CATH AND CORONARY ANGIOGRAPHY N/A 03/23/2019   Procedure: LEFT HEART CATH AND CORONARY ANGIOGRAPHY;  Surgeon: Lorretta Harp, MD;  Location: [place] [facility] CV LAB;  Service: Cardiovascular;  Laterality: N/A;  . LEFT HEART CATH AND CORONARY ANGIOGRAPHY N/A 05/03/2020   Procedure: LEFT HEART CATH AND CORONARY ANGIOGRAPHY;  Surgeon: Belva Crome, MD;  Location: [facility] CV LAB;  Service: Cardiovascular;  Laterality: N/A;  . LEFT HEART CATHETERIZATION WITH CORONARY ANGIOGRAM N/A 08/31/2012   Procedure: LEFT HEART CATHETERIZATION WITH CORONARY ANGIOGRAM;  Surgeon: Peter M Martinique, MD;  Location: [facility] CATH LAB;  Service: Cardiovascular;  Laterality: N/A;  . PERCUTANEOUS CORONARY STENT INTERVENTION (PCI-S) N/A 08/31/2012   Procedure: PERCUTANEOUS CORONARY STENT INTERVENTION (PCI-S);  Surgeon: Peter M Martinique, MD;  Location: [facility] CATH LAB;  Service: Cardiovascular;  Laterality: N/A;       Family History  Problem Relation Age of Onset  . Cancer Father   . Hypertension Father   . Hyperlipidemia Father   . Heart attack Father   . Hypertension Sister   . Hypertension Brother   . Hyperlipidemia Brother   . Heart attack Sister   . Heart attack Brother     Social History   Tobacco Use  . Smoking status: Former Smoker    Types: Pipe  . Smokeless tobacco: Former Systems developer    Quit date: 07/15/1968  Substance Use Topics  . Alcohol use: No  . Drug use: No    Home Medications Prior to Admission medications   Medication Sig Start Date End Date Taking? Authorizing Provider  ALPRAZolam Duanne Moron) 0.5 MG tablet Take 1 tablet (0.5 mg total) by mouth at bedtime as needed for anxiety. 05/26/20   Croitoru, Mihai, MD  aspirin 81 MG chewable tablet Chew 81 mg by mouth daily.    [provider]  atorvastatin (LIPITOR) 80 MG tablet TAKE 1 TABLET (80 MG TOTAL) BY MOUTH  DAILY AT 6 PM. 08/31/19   Kilroy, Doreene Burke, PA-C  clopidogrel (PLAVIX) 75 MG tablet Take 1 tablet (75 mg total) by mouth daily. 05/07/20   Strader, Fransisco Hertz, PA-C  escitalopram (LEXAPRO) 10 MG tablet Take 1 tablet (10 mg total) by mouth daily. 05/26/20   Croitoru, Mihai, MD  ezetimibe (ZETIA) 10 MG tablet Take 1 tablet (10 mg total) by mouth daily. 06/25/19 05/26/20  Croitoru, Mihai, MD  metoprolol succinate (TOPROL-XL) 50 MG 24 hr tablet Take 1.5 tablets (75 mg total) by mouth daily. Take with or immediately following a meal. 05/06/20   Strader, [place] M, PA-C  Multiple Vitamin (MULTIVITAMIN WITH MINERALS) TABS tablet Take 1 tablet by mouth daily.    [provider]  nitroGLYCERIN (NITROSTAT) 0.4 MG SL tablet Place 1 tablet (0.4 mg total) under the tongue every 5 (five) minutes x 3 doses as needed for chest pain. 05/06/20   Strader, Fransisco Hertz, PA-C  Omega-3 Fatty Acids (FISH OIL) 1000 MG CAPS Take 1,000 mg by mouth in the morning and at bedtime.    [provider]  sacubitril-valsartan (ENTRESTO) 49-51 MG Take 1 tablet by mouth 2 (two) times daily. 05/26/20   Croitoru, Dani Gobble, MD    Allergies    Patient has no known allergies.  Review of Systems   Review of Systems  All other systems reviewed and are negative.   Physical Exam Updated Vital Signs BP 94/70   Pulse (!) 53   Temp (!) 97.2 F (36.2 C)   Resp 16   Ht 1.702 m (_0 )   Wt 93.4 kg   SpO2 98%   BMI 32.25 kg/m   Physical Exam Vitals and nursing note reviewed.  Constitutional:      General: He is in acute distress.     Appearance: He is well-developed. He is diaphoretic.  HENT:     Head: Normocephalic and atraumatic.     Mouth/Throat:     Pharynx: No oropharyngeal exudate.  Eyes:     General: No scleral icterus.       Right eye: No discharge.        Left eye: No discharge.     Conjunctiva/sclera: Conjunctivae normal.     Pupils: Pupils are equal, round, and reactive to light.  Neck:      Thyroid: No thyromegaly.     Vascular: No JVD.  Cardiovascular:     Rate and Rhythm: Regular rhythm. Tachycardia present.     Heart sounds: Normal heart sounds. No murmur heard.  No friction rub. No gallop.      Comments: Weak peripheral artery pulses Pulmonary:     Effort: Pulmonary effort is normal. No respiratory distress.     Breath sounds: Normal breath sounds. No wheezing or rales.  Abdominal:     General: Bowel sounds are normal. There is no distension.     Palpations: Abdomen is soft. There is no mass.     Tenderness: There is no abdominal tenderness.  Musculoskeletal:        General: No tenderness. Normal range of motion.     Cervical back: Normal range of motion and neck supple.     Right lower leg: No edema.     Left lower leg: No edema.  Lymphadenopathy:     Cervical: No cervical adenopathy.  Skin:    General: Skin is warm.     Findings: No erythema or rash.  Neurological:     General: No focal deficit present.     Mental Status: He is alert.     Coordination: Coordination normal.     Comments: Generally weak but answering questions and following commands  Psychiatric:        Behavior: Behavior normal.     ED Results / Procedures / Treatments   Labs (all labs ordered are listed, but only abnormal results are displayed) Labs Reviewed  CBC WITH DIFFERENTIAL/PLATELET  COMPREHENSIVE METABOLIC PANEL  HEPARIN LEVEL (UNFRACTIONATED)  TROPONIN I (HIGH SENSITIVITY)    EKG Initial EKG performed June 04, 2020 at 11:29 AM, it shows a wide-complex regular tachycardia with a rate of 158 bpm, there appears to be a leftward axis, no obvious ST elevation or depression but significant abnormalities in this EKG.  Intervals show wide QRS, no obvious left ventricular hypertrophy.  Repeat EKG performed June 04, 2020 at 11:38 AM shows what appears to be normal sinus rhythm with a rate of 65 bpm with occasional PVCs, left axis deviation, QRS is now more normal, there is  some T wave abnormalities, no ST elevation, there is a slight ST depression  in lead aVL.  Radiology No results found.  Procedures .Critical Care Performed by: Noemi Chapel, MD Authorized by: Noemi Chapel, MD   Critical care provider statement:    Critical care time (minutes):  75   Critical care time was exclusive of:  Separately billable procedures and treating other patients and teaching time   Critical care was necessary to treat or prevent imminent or life-threatening deterioration of the following conditions:  Circulatory failure and cardiac failure   Critical care was time spent personally by me on the following activities:  Blood draw for specimens, development of treatment plan with patient or surrogate, discussions with consultants, evaluation of patient's response to treatment, examination of patient, obtaining history from patient or surrogate, ordering and performing treatments and interventions, ordering and review of laboratory studies, ordering and review of radiographic studies, pulse oximetry, re-evaluation of patient's condition and review of old charts Comments:         Procedure Name: Intubation Date/Time: 06/04/2020 1:37 PM Performed by: Noemi Chapel, MD Pre-anesthesia Checklist: Patient identified, Patient being monitored, Emergency Drugs available, Timeout performed and Suction available Oxygen Delivery Method: Non-rebreather mask Preoxygenation: Pre-oxygenation with 100% oxygen Induction Type: Rapid sequence Ventilation: Mask ventilation without difficulty Laryngoscope Size: Mac and 4 Grade View: Grade II Tube size: 8.0 mm Number of attempts: 1 Airway Equipment and Method: Stylet (direct laryngoscopy) Placement Confirmation: ETT inserted through vocal cords under direct vision,  CO2 detector and Breath sounds checked- equal and bilateral Secured at: 25 cm Tube secured with: ETT holder Dental Injury: Teeth and Oropharynx as per pre-operative assessment   Difficulty Due To: Difficulty was unanticipated Comments:       Temporary pacer  Date/Time: 06/04/2020 1:39 PM Performed by: Noemi Chapel, MD Authorized by: Noemi Chapel, MD  Preparation: Patient was prepped and draped in the usual sterile fashion. Local anesthesia used: no  Anesthesia: Local anesthesia used: no  Sedation: Patient sedated: no  Patient tolerance: patient tolerated the procedure well with no immediate complications Comments: The patient had transcutaneous temporary pacing at 92 mA with good mechanical capture    (including critical care time)  Medications Ordered in ED Medications  aspirin chewable tablet 324 mg (has no administration in time range)  lidocaine (cardiac) 100 mg/59m (XYLOCAINE) injection 2% (100 mg Intravenous Given 06/04/20 1130)  0.9 %  sodium chloride infusion (500 mLs Intravenous New Bag/Given 06/04/20 1140)  amiodarone (NEXTERONE) 1.8 mg/mL load via infusion 150 mg (has no administration in time range)    Followed by  amiodarone (NEXTERONE PREMIX) 360-4.14 MG/200ML-% (1.8 mg/mL) IV infusion (has no administration in time range)    Followed by  amiodarone (NEXTERONE PREMIX) 360-4.14 MG/200ML-% (1.8 mg/mL) IV infusion (has no administration in time range)  heparin bolus via infusion 4,000 Units (has no administration in time range)  heparin ADULT infusion 100 units/mL (25000 units/2563msodium chloride 0.45%) (has no administration in time range)  amiodarone (CORDARONE) 150 mg in dextrose 5 % 100 mL bolus (150 mg Intravenous New Bag/Given 06/04/20 1126)    ED Course  I have reviewed the triage vital signs and the nursing notes.  Pertinent labs & imaging results that were available during my care of the patient were reviewed by me and considered in my medical decision making (see chart for details).    MDM Rules/Calculators/A&P                            The patient presents in ventricular  tachycardia, this is a wide-complex  regular tachycardia rate of about 160 bpm.  When the patient arrived they took him off the amiodarone, started an amiodarone bolus as well as some lidocaine, within about 10 minutes the patient converted into a sinus rhythm with occasional PVCs.  He still feels lightheaded, he is still borderline hypotensive and given his recent history of admission for myocardial infarction with known occlusions of his prior grafting and his refusal to wear a LifeVest he may be a candidate for an implantable cardio defibrillator.  I have requested a cardiology consultation to be performed immediately, currently the patient appears better though he still has a relative hypotension.  He is getting some IV fluids, labs, heparin and aspirin, the patient is critically ill  I have discussed the call with cardiology, Dr. Quentin Ore, he will come to see the patient.  The patient will likely need to be admitted on amnio drip having intermittent long runs of V. tach, there are also intermittent episodes of long pauses of asystole when he is in sinus rhythm.  Unfortunately after the patient was restarted on lidocaine and amiodarone after cardiology saw the patient he had progressive bradycardia.  This ended up with a virtual asystole except for the occasional beat.  The patient required temporary pacing bridging him over to atropine 0.5 mg and dopamine, then he started to have more of an intrinsic rhythm.  The patient was slightly altered, he required intubation at the request of cardiology, please see my separate note.  The patient was temporarily placed for approximately 5 minutes at 92 mA, 70 bpm with capture  Final Clinical Impression(s) / ED Diagnoses Final diagnoses:  Ventricular tachycardia ([place])      Noemi Chapel, MD 06/04/20 1340

## 2020-06-04 NOTE — Progress Notes (Signed)
RT NOTE: RT transported patient on ventilator from ED to cath lab with no complications. Vitals are stable. RT will continue to monitor.

## 2020-06-04 NOTE — ED Notes (Signed)
PT HR  <40. MD Miller notified. Nurse to stop lido and amnio drip. To start dopamine, and levo. Pharm to get RSI kit for intubation.  

## 2020-06-04 NOTE — Consult Note (Signed)
Advanced Heart Failure Team Consult Note   Primary Physician: [name], MD PCP-Cardiologist:  Sanda Klein, MD  Reason for Consultation: VT arrest in setting of severe ischemic CM  HPI:    [name] is a 64 y.o. male with a PMH of CAD (s/p CABG in 2014 with LIMA-LAD, SVG-OM2 and SVG-RCA, s/p angioplasty to proximal SVG-RCA and DES to SVG-RCA anastomosisin 03/2019), chronic combined systolic and diastolic CHF/ICM (EF 41-28% by echo in 06/2019), HTN, HLD, prior CVA and recent STEMI with angioplasty of the distal bypass graft 05/04/20 whom we have been asked to see by Dr. Quentin Ore for VT arrest in setting of severe ischemic CM.    Presented on 05/03/20 as code STEMI with inferior ST elevation. Underwent emergent cath which showed severe native 3v CAD and thrombotic occlusion of the saphenous vein graft to the right coronary, with probable late stent thrombosis after missing Brilinta for 3 days. Underwent successful  POBA with reperfusion of the inferior wall reducing the 100% stenosis in the distal bypass graft and native vessel anastomosis reducing the stenosis to 50% with TIMI grade III flow.   Echo EF 25%. Moderate RV to severe RV dysfunction and possible LV thrombus. LifeVest placed. D/c'd home on 05/06/20  Developed presyncope at church today. EMS was called and he was found to be in wide complex tachycardia with a rate at 160bpm. He was given amiodarone bolus with an additional bolus with poor reposnse. In ER received another amio bolus and lido and developed asystole. Underwent external pacing and atropine and improved. (No CPR). Denies CP. + nausea and dyspnea. hstrop 157. k 4.2 Creatinine 1.3 -> 1.78. Patient intubated and taken emergently to the cath lab for TVP and coronary angio.   Cath with stable anatomy and preserved output.    Review of Systems: Unavailable due to intubation   Home Medications Prior to Admission medications   Medication Sig Start Date End  Date Taking? Authorizing Provider  aspirin 81 MG chewable tablet Chew 81 mg by mouth daily.   Yes [provider]  atorvastatin (LIPITOR) 80 MG tablet TAKE 1 TABLET (80 MG TOTAL) BY MOUTH DAILY AT 6 PM. 08/31/19  Yes Kilroy, Luke K, PA-C  metoprolol succinate (TOPROL-XL) 50 MG 24 hr tablet Take 1.5 tablets (75 mg total) by mouth daily. Take with or immediately following a meal. 05/06/20  Yes Strader, [place] M, PA-C  nitroGLYCERIN (NITROSTAT) 0.4 MG SL tablet Place 1 tablet (0.4 mg total) under the tongue every 5 (five) minutes x 3 doses as needed for chest pain. 05/06/20  Yes Strader, [place] M, PA-C  Omega-3 Fatty Acids (FISH OIL) 1000 MG CAPS Take 1,000 mg by mouth in the morning and at bedtime.   Yes [provider]  sacubitril-valsartan (ENTRESTO) 49-51 MG Take 1 tablet by mouth 2 (two) times daily. 05/26/20  Yes Croitoru, Mihai, MD  ALPRAZolam (XANAX) 0.5 MG tablet Take 1 tablet (0.5 mg total) by mouth at bedtime as needed for anxiety. 05/26/20   Croitoru, Mihai, MD  clopidogrel (PLAVIX) 75 MG tablet Take 1 tablet (75 mg total) by mouth daily. 05/07/20   Strader, Fransisco Hertz, PA-C  escitalopram (LEXAPRO) 10 MG tablet Take 1 tablet (10 mg total) by mouth daily. 05/26/20   Croitoru, Mihai, MD  ezetimibe (ZETIA) 10 MG tablet Take 1 tablet (10 mg total) by mouth daily. 06/25/19 05/26/20  Croitoru, Dani Gobble, MD    Past Medical History: Past Medical History:  Diagnosis Date  . Borderline diabetic   .  Coronary artery disease    a. s/p CABG in 2014 with LIMA-LAD, SVG-OM2 and SVG-RC b. 03/2019: STEMI and s/p angioplasty to proximal SVG-RCA and DES to SVG-RCA anastomosis c. 04/2020: recurrent STEMI with angioplasty of SVG-RCA  . Dyslipidemia 01/11/2013  . Hiatal hernia   . Hypertension   . Mini stroke ([place])    2  . S/P CABG x 3   . STEMI (ST elevation myocardial infarction) ([place])   . Stroke [facility])     Past Surgical History: Past Surgical History:  Procedure Laterality Date  .  CARDIAC CATHETERIZATION    . CORONARY ARTERY BYPASS GRAFT N/A 09/01/2012   Procedure: CORONARY ARTERY BYPASS GRAFTING (CABG);  Surgeon: Gaye Pollack, MD;  Location: [place];  Service: Open Heart Surgery;  Laterality: N/A;  Coronary Artery Bypass Grafting Times Three Using Left Internal Mammary Artery and Left Saphenous leg Vein Harvested Endoscopically  . CORONARY STENT INTERVENTION N/A 03/23/2019   Procedure: CORONARY STENT INTERVENTION;  Surgeon: Lorretta Harp, MD;  Location: [place] CV LAB;  Service: Cardiovascular;  Laterality: N/A;  . CORONARY/GRAFT ACUTE MI REVASCULARIZATION N/A 03/23/2019   Procedure: Coronary/Graft Acute MI Revascularization;  Surgeon: Lorretta Harp, MD;  Location: [place] CV LAB;  Service: Cardiovascular;  Laterality: N/A;  . CORONARY/GRAFT ACUTE MI REVASCULARIZATION N/A 05/03/2020   Procedure: Coronary/Graft Acute MI Revascularization;  Surgeon: Belva Crome, MD;  Location: [place] CV LAB;  Service: Cardiovascular;  Laterality: N/A;  . INTRAOPERATIVE TRANSESOPHAGEAL ECHOCARDIOGRAM N/A 09/01/2012   Procedure: INTRAOPERATIVE TRANSESOPHAGEAL ECHOCARDIOGRAM;  Surgeon: Gaye Pollack, MD;  Location: [place] OR;  Service: Open Heart Surgery;  Laterality: N/A;  . LEFT HEART CATH AND CORONARY ANGIOGRAPHY N/A 03/23/2019   Procedure: LEFT HEART CATH AND CORONARY ANGIOGRAPHY;  Surgeon: Lorretta Harp, MD;  Location: [place] CV LAB;  Service: Cardiovascular;  Laterality: N/A;  . LEFT HEART CATH AND CORONARY ANGIOGRAPHY N/A 05/03/2020   Procedure: LEFT HEART CATH AND CORONARY ANGIOGRAPHY;  Surgeon: Belva Crome, MD;  Location: [place] CV LAB;  Service: Cardiovascular;  Laterality: N/A;  . LEFT HEART CATHETERIZATION WITH CORONARY ANGIOGRAM N/A 08/31/2012   Procedure: LEFT HEART CATHETERIZATION WITH CORONARY ANGIOGRAM;  Surgeon: Peter M Martinique, MD;  Location: [facility] CATH LAB;  Service: Cardiovascular;  Laterality: N/A;  . PERCUTANEOUS CORONARY STENT INTERVENTION (PCI-S)  N/A 08/31/2012   Procedure: PERCUTANEOUS CORONARY STENT INTERVENTION (PCI-S);  Surgeon: Peter M Martinique, MD;  Location: [facility] CATH LAB;  Service: Cardiovascular;  Laterality: N/A;    Family History: Family History  Problem Relation Age of Onset  . Cancer Father   . Hypertension Father   . Hyperlipidemia Father   . Heart attack Father   . Hypertension Sister   . Hypertension Brother   . Hyperlipidemia Brother   . Heart attack Sister   . Heart attack Brother     Social History: Social History   Socioeconomic History  . Marital status: Married    Spouse name: Not on file  . Number of children: Not on file  . Years of education: Not on file  . Highest education level: Not on file  Occupational History  . Not on file  Tobacco Use  . Smoking status: Former Smoker    Types: Pipe  . Smokeless tobacco: Former Systems developer    Quit date: 07/15/1968  Substance and Sexual Activity  . Alcohol use: No  . Drug use: No  . Sexual activity: Not on file  Other Topics Concern  . Not on  file  Social History Narrative  . Not on file   Social Determinants of Health   Financial Resource Strain:   . Difficulty of Paying Living Expenses: Not on file  Food Insecurity:   . Worried About Charity fundraiser in the Last Year: Not on file  . Ran Out of Food in the Last Year: Not on file  Transportation Needs:   . Lack of Transportation (Medical): Not on file  . Lack of Transportation (Non-Medical): Not on file  Physical Activity:   . Days of Exercise per Week: Not on file  . Minutes of Exercise per Session: Not on file  Stress:   . Feeling of Stress : Not on file  Social Connections:   . Frequency of Communication with Friends and Family: Not on file  . Frequency of Social Gatherings with Friends and Family: Not on file  . Attends Religious Services: Not on file  . Active Member of Clubs or Organizations: Not on file  . Attends Archivist Meetings: Not on file  . Marital Status: Not on  file    Allergies:  No Known Allergies  Objective:    Vital Signs:   Temp:  [97.2 F (36.2 C)] 97.2 F (36.2 C) (11/21 1130) Pulse Rate:  [28-150] 150 (11/21 1400) Resp:  [9-47] 18 (11/21 1400) BP: (74-168)/(51-144) 164/144 (11/21 1400) SpO2:  [93 %-100 %] 100 % (11/21 1451) FiO2 (%):  [100 %] 100 % (11/21 1339) Weight:  [93.4 kg] 93.4 kg (11/21 1135)    Weight change: Filed Weights   06/04/20 1135  Weight: 93.4 kg    Intake/Output:   Intake/Output Summary (Last 24 hours) at 06/04/2020 1454 Last data filed at 06/04/2020 1321 Gross per 24 hour  Intake 746.11 ml  Output --  Net 746.11 ml      Physical Exam    General:  Intubated/sedated HEENT: normal + ETT Neck: supple. JVP to jaw . Carotids 2+ bilat; no bruits. No lymphadenopathy or thyromegaly appreciated. Cor: PMI nondisplaced. Tachy regular Lungs: coarse Abdomen: soft, nontender, nondistended. No hepatosplenomegaly. No bruits or masses. Good bowel sounds. Extremities: no cyanosis, clubbing, rash, edema Neuro: intubated/sedated  Telemetry   Sinus tach 100-110 Personally reviewed   EKG    SR 65 IVCD Frequent PVCs. Inferolateral TWI Personally reviewed  Labs   Basic Metabolic Panel: Recent Labs  Lab 06/04/20 1148  NA 139  K 4.2  CL 104  CO2 22  GLUCOSE 143*  BUN 20  CREATININE 1.78*  CALCIUM 9.0    Liver Function Tests: Recent Labs  Lab 06/04/20 1148  AST 26  ALT 22  ALKPHOS 66  BILITOT 1.1  PROT 7.3  ALBUMIN 3.9   No results for input(s): LIPASE, AMYLASE in the last 168 hours. No results for input(s): AMMONIA in the last 168 hours.  CBC: Recent Labs  Lab 06/04/20 1148  WBC 6.5  NEUTROABS 1.5*  HGB 17.3*  HCT 53.7*  MCV 95.2  PLT 297    Cardiac Enzymes: No results for input(s): CKTOTAL, CKMB, CKMBINDEX, TROPONINI in the last 168 hours.  BNP: BNP (last 3 results) Recent Labs    05/03/20 2330 05/04/20 0536  BNP 1,169.8* 1,408.4*    ProBNP (last 3 results) No  results for input(s): PROBNP in the last 8760 hours.   CBG: No results for input(s): GLUCAP in the last 168 hours.  Coagulation Studies: No results for input(s): LABPROT, INR in the last 72 hours.   Imaging   DG  Chest Port 1 View  Result Date: 06/04/2020 CLINICAL DATA:  Intubation EXAM: PORTABLE CHEST 1 VIEW COMPARISON:  Same day chest radiograph FINDINGS: Interval placement of endotracheal tube with distal tip terminating approximately 3.1 cm above the carina. Interval placement of enteric tube which courses below the diaphragm with distal tip beyond the inferior margin of the film. Prior median sternotomy. Multiple overlying cardiac leads and pacer pad. Stable cardiomegaly. Left basilar atelectasis. No evidence of pneumothorax. IMPRESSION: 1. Interval placement of endotracheal and enteric tubes, as described. 2. Left basilar atelectasis. Electronically Signed   By: Davina Poke D.O.   On: 06/04/2020 14:44   DG Chest Port 1 View  Result Date: 06/04/2020 CLINICAL DATA:  64 year old male with chest pain, ventricular tachycardia. EXAM: PORTABLE CHEST 1 VIEW COMPARISON:  Portable chest 03/23/2019 and earlier. FINDINGS: Portable AP semi upright view at 1140 hours. Pacer or resuscitation pads project over the chest. Stable mild cardiomegaly. Prior sternotomy. Other mediastinal contours are within normal limits. Visualized tracheal air column is within normal limits. Allowing for portable technique the lungs are clear. No pneumothorax or pleural effusion. No acute osseous abnormality identified. IMPRESSION: Stable mild cardiomegaly.  No acute cardiopulmonary abnormality. Electronically Signed   By: Genevie Ann M.D.   On: 06/04/2020 12:26      Medications:     Current Medications: . sterile water (preservative free)      . vecuronium         Infusions: . amiodarone Stopped (06/04/20 1321)   Followed by  . amiodarone    . DOPamine 15 mcg/kg/min (06/04/20 1321)  . heparin 1,050 Units/hr  (06/04/20 1216)  . lidocaine Stopped (06/04/20 1321)  . norepinephrine (LEVOPHED) Adult infusion 30 mcg/min (06/04/20 1321)  . propofol (DIPRIVAN) infusion         Assessment/Plan   1. VT arrest -> asystole - emergent cath 06/04/20 with stable coronary anatomy - suspect scar mediated VT - Continue amio - EP managing - will need ICD prior to d/c - Keep K > 4.0 Mg > 2.0  2. Chronic systolic HF with biventricular failure -> cardiogenic shock  - Echo 05/04/20 EF 25%. Moderate RV to severe RV dysfunction and possible LV thrombus. LifeVest placed. D/c'd home on 05/06/20 - RHC on admit with preserved output and well compensated filling pressures.  - Repeat echo  - Resume GDMT as able.   3. CAD  - s/p CABG  - Inferior STEMI 05/03/20 s/p POBA SVG->PDA - Emergent cath 06/04/20 with stable coronary anatomy - Resume DAPT/statin  4. Possible LV thrombus on echo 05/04/20 - start heparin 8 hours post cath   5. Acute hypoxic respiratory failure - intubated in setting of cardiac arrest - CCM consulted to help manage vent and sedation (appreciate their help)  CRITICAL CARE Performed by: Glori Bickers  Total critical care time: 60 minutes  Critical care time was exclusive of separately billable procedures and treating other patients.  Critical care was necessary to treat or prevent imminent or life-threatening deterioration.  Critical care was time spent personally by me (independent of midlevel providers or residents) on the following activities: development of treatment plan with patient and/or surrogate as well as nursing, discussions with consultants, evaluation of patient's response to treatment, examination of patient, obtaining history from patient or surrogate, ordering and performing treatments and interventions, ordering and review of laboratory studies, ordering and review of radiographic studies, pulse oximetry and re-evaluation of patient's condition.    Length of  Stay: 0  Glori Bickers,  MD  06/04/2020, 2:54 PM  Advanced Heart Failure Team Pager [phone] (M-F; 7a - 4p)  Please contact [facility] for night-coverage after hours (4p -7a ) and weekends on [email or web address]

## 2020-06-04 NOTE — CV Procedure (Signed)
Radial arterial line placement.    Emergent consent assumed. The right wrist was prepped and draped in the routine sterile fashion a single lumen radial arterial catheter was placed in the right radial artery using a modified Seldinger technique. Good blood flow and wave forms. A dressing was placed.     Glori Bickers, MD  4:16 PM

## 2020-06-04 NOTE — ED Triage Notes (Signed)
BIB [facility] after bystander at church saw that pt was not feeling well. According to EMS, pt was in [facility] upon arrival. PT has hx of MI ( last one 05/26/20), stent placement.   B/P  140/62 RR 15 HR 144 Vtach Temp 97.2

## 2020-06-04 NOTE — ED Notes (Signed)
Per MD Quentin Ore nurse to rebolus 150 mg of Amnio, and start Lido drip at 2 mg with 100 mg bolus.

## 2020-06-04 NOTE — ED Notes (Signed)
Pt transported to cath lab.  

## 2020-06-05 ENCOUNTER — Encounter (HOSPITAL_COMMUNITY): Payer: Self-pay | Admitting: Cardiology

## 2020-06-05 ENCOUNTER — Inpatient Hospital Stay (HOSPITAL_COMMUNITY): Payer: Medicare HMO

## 2020-06-05 ENCOUNTER — Encounter (HOSPITAL_COMMUNITY)
Admission: EM | Disposition: A | Discharge status: Rehabilitation Facility | Payer: Self-pay | Source: Home / Self Care | Attending: Cardiology

## 2020-06-05 DIAGNOSIS — I472 Ventricular tachycardia: Secondary | ICD-10-CM | POA: Diagnosis not present

## 2020-06-05 DIAGNOSIS — R570 Cardiogenic shock: Secondary | ICD-10-CM | POA: Diagnosis not present

## 2020-06-05 DIAGNOSIS — J9601 Acute respiratory failure with hypoxia: Secondary | ICD-10-CM | POA: Diagnosis not present

## 2020-06-05 DIAGNOSIS — I5022 Chronic systolic (congestive) heart failure: Secondary | ICD-10-CM | POA: Diagnosis not present

## 2020-06-05 LAB — POCT I-STAT EG7
Acid-base deficit: 4 mmol/L — ABNORMAL HIGH (ref 0.0–2.0)
Acid-base deficit: 4 mmol/L — ABNORMAL HIGH (ref 0.0–2.0)
Acid-base deficit: 4 mmol/L — ABNORMAL HIGH (ref 0.0–2.0)
Acid-base deficit: 5 mmol/L — ABNORMAL HIGH (ref 0.0–2.0)
Bicarbonate: 23.1 mmol/L (ref 20.0–28.0)
Bicarbonate: 23.6 mmol/L (ref 20.0–28.0)
Bicarbonate: 22 mmol/L (ref 20.0–28.0)
Bicarbonate: 23 mmol/L (ref 20.0–28.0)
Calcium, Ion: 1.15 mmol/L (ref 1.15–1.40)
Calcium, Ion: 1.17 mmol/L (ref 1.15–1.40)
Calcium, Ion: 1 mmol/L — ABNORMAL LOW (ref 1.15–1.40)
Calcium, Ion: 1.19 mmol/L (ref 1.15–1.40)
HCT: 55 % — ABNORMAL HIGH (ref 39.0–52.0)
HCT: 55 % — ABNORMAL HIGH (ref 39.0–52.0)
HCT: 52 % (ref 39.0–52.0)
HCT: 55 % — ABNORMAL HIGH (ref 39.0–52.0)
Hemoglobin: 18.7 g/dL — ABNORMAL HIGH (ref 13.0–17.0)
Hemoglobin: 18.7 g/dL — ABNORMAL HIGH (ref 13.0–17.0)
Hemoglobin: 17.7 g/dL — ABNORMAL HIGH (ref 13.0–17.0)
Hemoglobin: 18.7 g/dL — ABNORMAL HIGH (ref 13.0–17.0)
O2 Saturation: 82 %
O2 Saturation: 87 %
O2 Saturation: 86 %
O2 Saturation: 89 %
Potassium: 3.9 mmol/L (ref 3.5–5.1)
Potassium: 3.9 mmol/L (ref 3.5–5.1)
Potassium: 3.5 mmol/L (ref 3.5–5.1)
Potassium: 3.9 mmol/L (ref 3.5–5.1)
Sodium: 141 mmol/L (ref 135–145)
Sodium: 141 mmol/L (ref 135–145)
Sodium: 141 mmol/L (ref 135–145)
Sodium: 143 mmol/L (ref 135–145)
TCO2: 25 mmol/L (ref 22–32)
TCO2: 25 mmol/L (ref 22–32)
TCO2: 23 mmol/L (ref 22–32)
TCO2: 24 mmol/L (ref 22–32)
pCO2, Ven: 49.4 mmHg (ref 44.0–60.0)
pCO2, Ven: 49.5 mmHg (ref 44.0–60.0)
pCO2, Ven: 46.8 mmHg (ref 44.0–60.0)
pCO2, Ven: 48 mmHg (ref 44.0–60.0)
pH, Ven: 7.277 (ref 7.250–7.430)
pH, Ven: 7.287 (ref 7.250–7.430)
pH, Ven: 7.28 (ref 7.250–7.430)
pH, Ven: 7.289 (ref 7.250–7.430)
pO2, Ven: 53 mmHg — ABNORMAL HIGH (ref 32.0–45.0)
pO2, Ven: 60 mmHg — ABNORMAL HIGH (ref 32.0–45.0)
pO2, Ven: 57 mmHg — ABNORMAL HIGH (ref 32.0–45.0)
pO2, Ven: 63 mmHg — ABNORMAL HIGH (ref 32.0–45.0)

## 2020-06-05 LAB — POCT I-STAT 7, (LYTES, BLD GAS, ICA,H+H)
Acid-base deficit: 5 mmol/L — ABNORMAL HIGH (ref 0.0–2.0)
Bicarbonate: 22.7 mmol/L (ref 20.0–28.0)
Calcium, Ion: 1.18 mmol/L (ref 1.15–1.40)
HCT: 55 % — ABNORMAL HIGH (ref 39.0–52.0)
Hemoglobin: 18.7 g/dL — ABNORMAL HIGH (ref 13.0–17.0)
O2 Saturation: 99 %
Potassium: 3.8 mmol/L (ref 3.5–5.1)
Sodium: 140 mmol/L (ref 135–145)
TCO2: 24 mmol/L (ref 22–32)
pCO2 arterial: 48.1 mmHg — ABNORMAL HIGH (ref 32.0–48.0)
pH, Arterial: 7.282 — ABNORMAL LOW (ref 7.350–7.450)
pO2, Arterial: 183 mmHg — ABNORMAL HIGH (ref 83.0–108.0)

## 2020-06-05 LAB — ECHOCARDIOGRAM COMPLETE
Calc EF: 25.2 %
Height: 67 in
S' Lateral: 5.1 cm
Single Plane A2C EF: 26.4 %
Single Plane A4C EF: 22.9 %
Weight: 3276.92 oz

## 2020-06-05 LAB — CBC
HCT: 45.4 % (ref 39.0–52.0)
Hemoglobin: 15 g/dL (ref 13.0–17.0)
MCH: 30.8 pg (ref 26.0–34.0)
MCHC: 33 g/dL (ref 30.0–36.0)
MCV: 93.2 fL (ref 80.0–100.0)
Platelets: 264 10*3/uL (ref 150–400)
RBC: 4.87 MIL/uL (ref 4.22–5.81)
RDW: 12.9 % (ref 11.5–15.5)
WBC: 16.1 10*3/uL — ABNORMAL HIGH (ref 4.0–10.5)
nRBC: 0 % (ref 0.0–0.2)

## 2020-06-05 LAB — COOXEMETRY PANEL
Carboxyhemoglobin: 0.6 % (ref 0.5–1.5)
Carboxyhemoglobin: 0.6 % (ref 0.5–1.5)
Methemoglobin: 0.9 % (ref 0.0–1.5)
Methemoglobin: 0.9 % (ref 0.0–1.5)
O2 Saturation: 66 %
O2 Saturation: 60.4 %
Total hemoglobin: 14.3 g/dL (ref 12.0–16.0)
Total hemoglobin: 15.1 g/dL (ref 12.0–16.0)

## 2020-06-05 LAB — URINALYSIS, COMPLETE (UACMP) WITH MICROSCOPIC
Bilirubin Urine: NEGATIVE
Glucose, UA: NEGATIVE mg/dL
Hgb urine dipstick: NEGATIVE
Ketones, ur: NEGATIVE mg/dL
Leukocytes,Ua: NEGATIVE
Nitrite: NEGATIVE
Protein, ur: NEGATIVE mg/dL
Specific Gravity, Urine: 1.023 (ref 1.005–1.030)
pH: 5 (ref 5.0–8.0)

## 2020-06-05 LAB — BASIC METABOLIC PANEL
Anion gap: 11 (ref 5–15)
BUN: 20 mg/dL (ref 8–23)
CO2: 19 mmol/L — ABNORMAL LOW (ref 22–32)
Calcium: 7.9 mg/dL — ABNORMAL LOW (ref 8.9–10.3)
Chloride: 109 mmol/L (ref 98–111)
Creatinine, Ser: 1.45 mg/dL — ABNORMAL HIGH (ref 0.61–1.24)
GFR, Estimated: 53 mL/min — ABNORMAL LOW (ref >60)
Glucose, Bld: 124 mg/dL — ABNORMAL HIGH (ref 70–99)
Potassium: 3.9 mmol/L (ref 3.5–5.1)
Sodium: 139 mmol/L (ref 135–145)

## 2020-06-05 LAB — MAGNESIUM: Magnesium: 1.6 mg/dL — ABNORMAL LOW (ref 1.7–2.4)

## 2020-06-05 LAB — POCT ACTIVATED CLOTTING TIME: Activated Clotting Time: 120 seconds

## 2020-06-05 LAB — PROCALCITONIN: Procalcitonin: 0.22 ng/mL

## 2020-06-05 LAB — HEPARIN LEVEL (UNFRACTIONATED): Heparin Unfractionated: 0.17 IU/mL — ABNORMAL LOW (ref 0.30–0.70)

## 2020-06-05 LAB — LACTIC ACID, PLASMA: Lactic Acid, Venous: 1 mmol/L (ref 0.5–1.9)

## 2020-06-05 SURGERY — TEMPORARY PACEMAKER
Anesthesia: LOCAL

## 2020-06-05 MED ORDER — NOREPINEPHRINE 4 MG/250ML-% IV SOLN: 0.0000 ug/min | INTRAVENOUS | Status: DC

## 2020-06-05 MED ORDER — MAGNESIUM SULFATE 2 GM/50ML IV SOLN
2.0000 g | Freq: Once | INTRAVENOUS | Status: AC
  Administered 2020-06-05: 2 g via INTRAVENOUS
  Filled 2020-06-05: qty 50

## 2020-06-05 MED ORDER — PERFLUTREN LIPID MICROSPHERE
1.0000 mL | INTRAVENOUS | Status: AC | PRN
  Administered 2020-06-05: 4 mL via INTRAVENOUS
  Filled 2020-06-05: qty 10

## 2020-06-05 MED ORDER — NOREPINEPHRINE 16 MG/250ML-% IV SOLN
0.0000 ug/min | INTRAVENOUS | Status: DC
  Administered 2020-06-05: 20 ug/min via INTRAVENOUS
  Administered 2020-06-06: 14 ug/min via INTRAVENOUS
  Filled 2020-06-05 (×3): qty 250

## 2020-06-05 MED ORDER — POTASSIUM CHLORIDE 20 MEQ PO PACK
40.0000 meq | PACK | Freq: Once | ORAL | Status: DC
  Filled 2020-06-05: qty 2

## 2020-06-05 MED ORDER — MILRINONE LACTATE IN DEXTROSE 20-5 MG/100ML-% IV SOLN
0.1250 ug/kg/min | INTRAVENOUS | Status: DC
  Administered 2020-06-05 – 2020-06-09 (×5): 0.125 ug/kg/min via INTRAVENOUS
  Filled 2020-06-05 (×5): qty 100

## 2020-06-05 MED ORDER — POTASSIUM CHLORIDE 20 MEQ PO PACK
40.0000 meq | PACK | Freq: Once | ORAL | Status: AC
  Administered 2020-06-05: 40 meq

## 2020-06-05 MED ORDER — HEPARIN (PORCINE) 25000 UT/250ML-% IV SOLN
1500.0000 [IU]/h | INTRAVENOUS | Status: DC
  Administered 2020-06-05 – 2020-06-07 (×3): 1500 [IU]/h via INTRAVENOUS
  Filled 2020-06-05 (×3): qty 250

## 2020-06-05 MED ORDER — ASPIRIN 81 MG PO CHEW
81.0000 mg | CHEWABLE_TABLET | Freq: Every day | ORAL | Status: DC
  Administered 2020-06-05 – 2020-06-10 (×5): 81 mg
  Filled 2020-06-05 (×6): qty 1

## 2020-06-05 NOTE — Progress Notes (Signed)
NAME:  [name], MRN:  [id number], DOB:  [birth year], LOS: 1 ADMISSION DATE:  06/04/2020, CONSULTATION DATE:  06/04/2020 REFERRING MD:  Dr. Haroldine Laws, CHIEF COMPLAINT:  VT cardiac arrest  Brief History   49 yoM with extensive cardiac history with recent STEMI 04/2020, with recurrent VT who developed severe bradycardia on amiodarone and lidocaine gtt requiring external pacing.  Intubated and emergently taken to cath lab for TVP, PA catheter, and R/LHC.  PCCM consulted for ventilator management.   History of present illness   HPI obtained from medical chart review as patient is sedated and intubated on mechanical ventilation.   64 year old male with history of CAD w/ previous CABG in 2014, chronic combined systolic and diastolic HF/ ICM, HTN, HLD, CVA, and recent inferior STEMI 05/03/20 secondary to thrombotic occlusion of the SVG to the RCA, likely due to missed ticagrelor doses.  EF noted to be 25% with moderate RV to severe RV dysfunction and possible LV thrombus 05/04/2020.  He was discharged home on 10/23 on a LifeVest, but apparently not using.    On 11/21, he developed dizziness at church.  EMS found patient in wide complex tachycardia treated with amiodarone and lidocaine with successful conversion to NSR.  On arrival to ER, patient had recurrence of his VT but hemodynamically tolerating.  He was given additional amiodarone and lidocaine gtt started, however developed severe bradycardia requiring atropine and external pacing.  He was intubated and taken emergently for temporary transvenous pacemaker and coronary angiogram.  Returns to ICU sedated and intubated, PCCM consulted for further ventilator management.   Past Medical History  CAD (s/p CABG in 2014 with LIMA-LAD, SVG-OM2 and SVG-RCA, s/p angioplasty to proximal SVG-RCA and DES to SVG-RCA anastomosisin 03/2019), chronic combined systolic and diastolic CHF/ICM (EF 63-14% by echo in 06/2019), HTN, HLD,prior CVA  Significant  Hospital Events   11/21 Admitted cardiology/ intubated/ L/RHC, TVP, PA catheter  Consults:  HF CCM  Procedures:  11/21 ETT >> 11/21 R IJ cordis with PA catheter >> 11/21 R femoral TVP >> 11/21 R radial aline >>  Significant Diagnostic Tests:  11/21 [facility] >>  Mid LAD lesion is 100% stenosed.  Prox Cx lesion is 100% stenosed.  Prox RCA lesion is 100% stenosed.  Dist RCA lesion is 20% stenosed.  Non-stenotic Origin to Prox Graft lesion was previously treated.  Prox Graft lesion is 30% stenosed.  LV end diastolic pressure is mildly elevated.   1. Severe 3 vessel occlusive CAD. 2. Patent LIMA to the LAD 3. Patent SVG to OM1 4. Patent SVG to RCA. Much improved flow from prior PCI. 5. Mildly elevated LVEDP 17 mm Hg 6. Successful placement of temporary transvenous pacemaker.  Micro Data:  11/21 SARS2/ flu >>neg  Antimicrobials:  n/a  Interim history/subjective:  Remains sedated, intubated after events of yesterday.   Objective   Blood pressure 101/84, pulse (!) 52, temperature 100.2 F (37.9 C), resp. rate (!) 22, height 5\' 7"  (1.702 m), weight 92.9 kg, SpO2 98 %. PAP: (22-47)/(16-29) 39/27 CVP:  [3 mmHg-16 mmHg] 13 mmHg CO:  [2.5 L/min] 2.5 L/min CI:  [1.2 L/min/m2] 1.2 L/min/m2  Vent Mode: PRVC FiO2 (%):  [40 %-100 %] 40 % Set Rate:  [18 bmp-22 bmp] 22 bmp Vt Set:  [520 mL-530 mL] 520 mL PEEP:  [5 cmH20] 5 cmH20 Plateau Pressure:  [15 cmH20-19 cmH20] 16 cmH20   Intake/Output Summary (Last 24 hours) at 06/05/2020 0736 Last data filed at 06/05/2020 0700 Gross per 24 hour  Intake 3214.21 ml  Output 515 ml  Net 2699.21 ml   Filed Weights   06/04/20 1135 06/05/20 0556  Weight: 93.4 kg 92.9 kg   Examination: General:  Adult male of normal body habitus sedated on vent.  HEENT: [place]/AT, PERRL, no JVD Neuro: sedated, RASS -3.  CV: paced, RRR, no MRG PULM:  MV supported, clear.  GI: soft, non-distended Extremities: warm/dry, no LE edema  Skin: no  rashes  Resolved Hospital Problem list    Assessment & Plan:   Acute hypoxic respiratory failure - full MV support, PRVC 8 cc/kg  - Weaning sedation today per primary, SBT at that time.  - VAP bundle - PAD protocol with propofol/ fentanyl. SAT this morning.   Recurrent VT Cardiogenic shock Chronic systolic/ diastolic HF with biventricular failure ICM  CAD s/p CABG with recent inferior STEMI 04/2020 HTN s/p emergent R/LHC with patent grafts, preserved output and compensated filling pressures - TVP/ PA hemodynamics per Cardiology / HF team/ EP - NE for MAP goal > 65 (currently 14 mcg) - goal K > 4/ Mag > 2   Possible LV thrombus, based on 05/04/20 TTE - heparin infusion per pharmacy  AKI on CKD stage III, baseline sCr 1.3-1.4 - foley - hemodynamic support as above - trend UOP/ renal indices - avoid nephrotoxins   Best practice:  Diet: NPO Pain/Anxiety/Delirium protocol (if indicated): propofol/ fentanyl gtt VAP protocol (if indicated): yes DVT prophylaxis: heparin infusion GI prophylaxis: PPI Glucose control: add SSI if > 180 Mobility: BR Code Status: full  Family Communication: per primary  Disposition: ICU  Labs   CBC: Recent Labs  Lab 06/04/20 1148 06/04/20 1746 06/04/20 1803 06/04/20 1805 06/05/20 0334  WBC 6.5  --  21.1*  --  16.1*  NEUTROABS 1.5*  --   --   --   --   HGB 17.3* 16.7 16.1 16.0 15.0  HCT 53.7* 49.0 47.3 47.0 45.4  MCV 95.2  --  92.7  --  93.2  PLT 297  --  341  --  885    Basic Metabolic Panel: Recent Labs  Lab 06/04/20 1148 06/04/20 1746 06/04/20 1803 06/04/20 1805 06/05/20 0334  NA 139 140  --  140 139  K 4.2 4.5  --  4.3 3.9  CL 104  --   --   --  109  CO2 22  --   --   --  19*  GLUCOSE 143*  --   --   --  124*  BUN 20  --   --   --  20  CREATININE 1.78*  --  1.62*  --  1.45*  CALCIUM 9.0  --   --   --  7.9*  MG  --   --   --   --  1.6*   GFR: Estimated Creatinine Clearance: 54.4 mL/min (A) (by C-G formula based on  SCr of 1.45 mg/dL (H)). Recent Labs  Lab 06/04/20 1148 06/04/20 1803 06/05/20 0334  WBC 6.5 21.1* 16.1*    Liver Function Tests: Recent Labs  Lab 06/04/20 1148  AST 26  ALT 22  ALKPHOS 66  BILITOT 1.1  PROT 7.3  ALBUMIN 3.9   No results for input(s): LIPASE, AMYLASE in the last 168 hours. No results for input(s): AMMONIA in the last 168 hours.  ABG    Component Value Date/Time   PHART 7.418 06/04/2020 1805   PCO2ART 32.6 06/04/2020 1805   PO2ART 241 (H) 06/04/2020 1805   HCO3  21.2 06/04/2020 1805   TCO2 22 06/04/2020 1805   ACIDBASEDEF 3.0 (H) 06/04/2020 1805   O2SAT 100.0 06/04/2020 1805     Coagulation Profile: No results for input(s): INR, PROTIME in the last 168 hours.  Cardiac Enzymes: No results for input(s): CKTOTAL, CKMB, CKMBINDEX, TROPONINI in the last 168 hours.  HbA1C: Hgb A1c MFr Bld  Date/Time Value Ref Range Status  05/03/2020 11:30 PM 6.1 (H) 4.8 - 5.6 % Final    Comment:    (NOTE)         Prediabetes: 5.7 - 6.4         Diabetes: >6.4         Glycemic control for adults with diabetes: <7.0   03/25/2019 02:39 AM 5.6 4.8 - 5.6 % Final    Comment:    (NOTE)         Prediabetes: 5.7 - 6.4         Diabetes: >6.4         Glycemic control for adults with diabetes: <7.0     CBG: No results for input(s): GLUCAP in the last 168 hours.    Critical care time: 38 mins     Georgann Housekeeper, AGACNP-BC [place] for personal pager PCCM on call pager [phone]  06/05/2020 7:43 AM

## 2020-06-05 NOTE — Progress Notes (Addendum)
Advanced Heart Failure Rounding Note  PCP-Cardiologist: Sanda Klein, MD   Subjective:    Yesterday: Admitted for VT arrest in setting of severe ICM; taken to cath found to have stable anatomy and preserved output.     Today: Remains on Levo 14, Amio 30, hep gtt; CVP 8; CI 1.34; only 415 mL UOP.  Has had low grade fever w/ leukocytosis.     RHC Findings: Ao = 174/111 (138)  LV = 169/16 RA =  7 RV = 41/6 PA = 37/20 (28) PCW = 14 Fick cardiac output/index = 11.6/5.7 PVR = 1.2 WU FA sat = 99% PA sat = 85%, 86% SVC sat = 89%   Objective:   Weight Range: 92.9 kg Body mass index is 32.08 kg/m.   Vital Signs:   Temp:  [96.6 F (35.9 C)-100.4 F (38 C)] 100.2 F (37.9 C) (11/22 0700) Pulse Rate:  [25-150] 52 (11/22 0600) Resp:  [9-51] 22 (11/22 0700) BP: (74-206)/(51-144) 101/84 (11/22 0700) SpO2:  [0 %-100 %] 98 % (11/22 0600) Arterial Line BP: (82-124)/(43-77) 106/68 (11/22 0700) FiO2 (%):  [40 %-100 %] 40 % (11/22 0400) Weight:  [92.9 kg-93.4 kg] 92.9 kg (11/22 0556) Last BM Date:  (pta)  Weight change: Filed Weights   06/04/20 1135 06/05/20 0556  Weight: 93.4 kg 92.9 kg    Intake/Output:   Intake/Output Summary (Last 24 hours) at 06/05/2020 0706 Last data filed at 06/05/2020 0700 Gross per 24 hour  Intake 3214.21 ml  Output 515 ml  Net 2699.21 ml      Physical Exam    CVP 8 General:  Intubated HEENT:  Neck: supple. Swan present.  Cor: Paced on VVI. Regular rate. No rubs, gallops or murmurs. Lungs: clear bilaterally.  Abdomen: soft, nontender, nondistended.  Extremities: no cyanosis, clubbing, rash, edema. Cool lower extremities.  Neuro: Intubated.   Telemetry   Paced, rates 80s.  Personally reviewed.   EKG    Wide complex on EKG 11/21 at 11:54  Labs    CBC Recent Labs    06/04/20 1148 06/04/20 1746 06/04/20 1803 06/04/20 1803 06/04/20 1805 06/05/20 0334  WBC 6.5  --  21.1*  --   --  16.1*  NEUTROABS 1.5*  --   --   --    --   --   HGB 17.3*   < > 16.1   < > 16.0 15.0  HCT 53.7*   < > 47.3   < > 47.0 45.4  MCV 95.2  --  92.7  --   --  93.2  PLT 297  --  341  --   --  264   < > = values in this interval not displayed.   Basic Metabolic Panel Recent Labs    06/04/20 1148 06/04/20 1746 06/04/20 1803 06/04/20 1805 06/05/20 0334  NA 139   < >  --  140 139  K 4.2   < >  --  4.3 3.9  CL 104  --   --   --  109  CO2 22  --   --   --  19*  GLUCOSE 143*  --   --   --  124*  BUN 20  --   --   --  20  CREATININE 1.78*  --  1.62*  --  1.45*  CALCIUM 9.0  --   --   --  7.9*  MG  --   --   --   --  1.6*   < > = values in this interval not displayed.   Liver Function Tests Recent Labs    06/04/20 1148  AST 26  ALT 22  ALKPHOS 66  BILITOT 1.1  PROT 7.3  ALBUMIN 3.9   No results for input(s): LIPASE, AMYLASE in the last 72 hours. Cardiac Enzymes No results for input(s): CKTOTAL, CKMB, CKMBINDEX, TROPONINI in the last 72 hours.  BNP: BNP (last 3 results) Recent Labs    05/03/20 2330 05/04/20 0536  BNP 1,169.8* 1,408.4*    ProBNP (last 3 results) No results for input(s): PROBNP in the last 8760 hours.   D-Dimer No results for input(s): DDIMER in the last 72 hours. Hemoglobin A1C No results for input(s): HGBA1C in the last 72 hours. Fasting Lipid Panel Recent Labs    06/04/20 1803  TRIG 270*   Thyroid Function Tests No results for input(s): TSH, T4TOTAL, T3FREE, THYROIDAB in the last 72 hours.  Invalid input(s): FREET3  Other results:   Imaging    DG Abd 1 View  Result Date: 06/04/2020 CLINICAL DATA:  OG tube placement EXAM: ABDOMEN - 1 VIEW COMPARISON:  None. FINDINGS: The OG tube projects over the gastric body. A femoral approach Swan-Ganz catheter is noted with tip projecting over the right pulmonary artery. The visualized bowel gas pattern is nonspecific and nonobstructive. IMPRESSION: OG tube projects over the gastric body. Electronically Signed   By: Constance Holster  M.D.   On: 06/04/2020 19:17   CARDIAC CATHETERIZATION  Result Date: 06/04/2020  Mid LAD lesion is 100% stenosed.  Prox Cx lesion is 100% stenosed.  Prox RCA lesion is 100% stenosed.  Dist RCA lesion is 20% stenosed.  Non-stenotic Origin to Prox Graft lesion was previously treated.  Prox Graft lesion is 30% stenosed.  LV end diastolic pressure is mildly elevated.  1. Severe 3 vessel occlusive CAD. 2. Patent LIMA to the LAD 3. Patent SVG to OM1 4. Patent SVG to RCA. Much improved flow from prior PCI. 5. Mildly elevated LVEDP 17 mm Hg 6. Successful placement of temporary transvenous pacemaker. Plan: Dr Haroldine Laws placed a [name] catheter via the right internal jugular Vein. Will leave temporary transvenous pacemaker in place so that antiarrhythmic drugs can be used liberally.  Right heart cath demonstrates good cardiac output and right heart pressures so we deferred placing an IABP. Will need right femoral arterial sheath removed manually. RHC Findings: Ao = 174/111 (138) LV = 169/16 RA =  7 RV = 41/6 PA = 37/20 (28) PCW = 14 Fick cardiac output/index = 11.6/5.7 PVR = 1.2 WU FA sat = 99% PA sat = 85%, 86% SVC sat = 89% High cardiac output with normal filling pressures. No evidence of intracardiac shunting.    DG Chest Port 1 View  Result Date: 06/04/2020 CLINICAL DATA:  Intubation EXAM: PORTABLE CHEST 1 VIEW COMPARISON:  Same day chest radiograph FINDINGS: Interval placement of endotracheal tube with distal tip terminating approximately 3.1 cm above the carina. Interval placement of enteric tube which courses below the diaphragm with distal tip beyond the inferior margin of the film. Prior median sternotomy. Multiple overlying cardiac leads and pacer pad. Stable cardiomegaly. Left basilar atelectasis. No evidence of pneumothorax. IMPRESSION: 1. Interval placement of endotracheal and enteric tubes, as described. 2. Left basilar atelectasis. Electronically Signed   By: Davina Poke D.O.   On:  06/04/2020 14:44   DG Chest Port 1 View  Result Date: 06/04/2020 CLINICAL DATA:  64 year old male with chest pain,  ventricular tachycardia. EXAM: PORTABLE CHEST 1 VIEW COMPARISON:  Portable chest 03/23/2019 and earlier. FINDINGS: Portable AP semi upright view at 1140 hours. Pacer or resuscitation pads project over the chest. Stable mild cardiomegaly. Prior sternotomy. Other mediastinal contours are within normal limits. Visualized tracheal air column is within normal limits. Allowing for portable technique the lungs are clear. No pneumothorax or pleural effusion. No acute osseous abnormality identified. IMPRESSION: Stable mild cardiomegaly.  No acute cardiopulmonary abnormality. Electronically Signed   By: Genevie Ann M.D.   On: 06/04/2020 12:26      Medications:     Scheduled Medications: . aspirin EC  81 mg Oral Daily  . atorvastatin  80 mg Per Tube q1800  . chlorhexidine gluconate (MEDLINE KIT)  15 mL Mouth Rinse BID  . Chlorhexidine Gluconate Cloth  6 each Topical Daily  . Chlorhexidine Gluconate Cloth  6 each Topical Daily  . clopidogrel  75 mg Per Tube Daily  . docusate  100 mg Per Tube BID  . escitalopram  10 mg Per Tube Daily  . ezetimibe  10 mg Per Tube Daily  . fentaNYL (SUBLIMAZE) injection  25 mcg Intravenous Once  . mouth rinse  15 mL Mouth Rinse 10 times per day  . metoprolol tartrate  37.5 mg Per Tube BID  . pantoprazole (PROTONIX) IV  40 mg Intravenous Daily  . polyethylene glycol  17 g Per Tube Daily  . sodium chloride flush  10-40 mL Intracatheter Q12H  . sodium chloride flush  3 mL Intravenous Q12H     Infusions: . sodium chloride    . sodium chloride Stopped (06/05/20 0020)  . sodium chloride Stopped (06/05/20 0452)  . amiodarone 30 mg/hr (06/05/20 0700)  . fentaNYL infusion INTRAVENOUS 100 mcg/hr (06/05/20 0700)  . heparin 1,200 Units/hr (06/05/20 0700)  . lidocaine Stopped (06/04/20 1321)  . norepinephrine (LEVOPHED) Adult infusion 14 mcg/min (06/05/20 0700)   . propofol (DIPRIVAN) infusion 40 mcg/kg/min (06/05/20 0700)     PRN Medications:  sodium chloride, sodium chloride, sodium chloride, acetaminophen (TYLENOL) oral liquid 160 mg/5 mL, ALPRAZolam, fentaNYL, nitroGLYCERIN, ondansetron (ZOFRAN) IV, sodium chloride flush, sodium chloride flush    Patient Profile   [name] a 64 y.o.malewith aPMH of CAD (s/p CABG in 2014 with LIMA-LAD, SVG-OM2 and SVG-RCA, s/p angioplasty to proximal SVG-RCA and DES to SVG-RCA anastomosisin 03/2019), chronic combined systolic and diastolic CHF/ICM (EF 73-41% by echo in 06/2019), HTN, HLD,prior CVAand recent STEMIwith angioplasty of the distal bypass graft10/21/21 d/c home with Life Vest.  Presented in VT requiring multiple amio bolus and lido but developed asystole.  Underwent external pacing and atropine then sent to Cath lab.      Assessment/Plan   1. VT arrest -> asystole - emergent cath 06/04/20 with stable coronary anatomy - suspect scar mediated VT - Continue amio 30 - EP managing - will need ICD prior to d/c - Keep K > 4.0 Mg > 2.0  2. Chronic systolic HF with biventricular failure -> cardiogenic shock  - Echo 05/04/20 EF 25%. Moderate RV to severe RV dysfunction and possible LV thrombus. LifeVest placed. D/c'd home on 05/06/20 - RHC on admit with preserved output and well compensated filling pressures.  - Today: CVP 8, cool extremities, decrease UOP, CI 1.34 - Currently on Levo, may need to add additional inotropic support - Repeat echo  - Resume GDMT as able.   3. CAD  - s/p CABG  - Inferior STEMI 05/03/20 s/p POBA SVG->PDA - Emergent cath 06/04/20 with stable  coronary anatomy - Resume DAPT/statin  4. Possible LV thrombus on echo 05/04/20 - start heparin 8 hours post cath   5. Acute hypoxic respiratory failure - intubated in setting of cardiac arrest - CCM consulted to help manage vent and sedation (appreciate their help)  6.  Leukocytosis and febrile - WBC  admission 6.5 w/ neutro # 1.5; peak yesterday at 21.1; today 16.1; febrile 38 (low grade) - COVID, Flu negative - CXR shows L atelectasis - Obtain cultures and lactic - Low threshold to start abx  7.  AKI -baseline Cr on previous admission around 1.3 -Admission Cr 1.78, today Cr 1.45 -Decreased UOP -monitor I&Os and BMP   Medication concerns reviewed with patient and pharmacy team. Barriers identified: Compliance with medication and LifeVest  Length of Stay: [place], NP  06/05/2020, 7:06 AM  Advanced Heart Failure Team Pager [phone] (M-F; 7a - 4p)  Please contact [facility] for night-coverage after hours (4p -7a ) and weekends on [email or web address]  Agree with above.   Remains intubated sedated. VT quiescent on amio but he remains bradycardic requiring pacing and cardiac output much worse today. Luiz Blare numbers done personally.   General:  intubated sedated HEENT: normal Neck: supple. RIJ swan Carotids 2+ bilat; no bruits. No lymphadenopathy or thryomegaly appreciated. Cor: PMI nondisplaced. Regular rate & rhythm. No rubs, gallops or murmurs. Lungs: clear Abdomen: soft, nontender, nondistended. No hepatosplenomegaly. No bruits or masses. Good bowel sounds. Extremities: no cyanosis, clubbing, rash, edema. Cool  Neuro: alert & orientedx3, cranial nerves grossly intact. moves all 4 extremities w/o difficulty. Affect pleasant  Remains critically ill. With low output. Will start milrinone cautiously and follow outputs. Continue pacing for now (has sinus brady in 40s underneath). Once more stable will need to discuss device startegy with EP.   CRITICAL CARE Performed by: Glori Bickers  Total critical care time: 35 minutes  Critical care time was exclusive of separately billable procedures and treating other patients.  Critical care was necessary to treat or prevent imminent or life-threatening deterioration.  Critical care was time spent personally by me  (independent of midlevel providers or residents) on the following activities: development of treatment plan with patient and/or surrogate as well as nursing, discussions with consultants, evaluation of patient's response to treatment, examination of patient, obtaining history from patient or surrogate, ordering and performing treatments and interventions, ordering and review of laboratory studies, ordering and review of radiographic studies, pulse oximetry and re-evaluation of patient's condition.  Glori Bickers, MD  11:11 AM

## 2020-06-05 NOTE — Plan of Care (Signed)
  Problem: Cardiac: Goal: Ability to achieve and maintain adequate cardiopulmonary perfusion will improve Outcome: Progressing   Problem: Clinical Measurements: Goal: Diagnostic test results will improve Outcome: Progressing   Problem: Education: Goal: Ability to manage disease process will improve Outcome: Not Progressing   Problem: Education: Goal: Knowledge of General Education information will improve Description: Including pain rating scale, medication(s)/side effects and non-pharmacologic comfort measures Outcome: Not Progressing   Problem: Health Behavior/Discharge Planning: Goal: Ability to manage health-related needs will improve Outcome: Not Progressing   Problem: Clinical Measurements: Goal: Ability to maintain clinical measurements within normal limits will improve Outcome: Not Progressing

## 2020-06-05 NOTE — Progress Notes (Signed)
ANTICOAGULATION CONSULT NOTE - Follow Up Consult  Pharmacy Consult for heparin Indication: LV Thrombus  No Known Allergies  Patient Measurements: Height: 5\' 7"  (170.2 cm) Weight: 92.9 kg (204 lb 12.9 oz) IBW/kg (Calculated) : 66.1 Heparin Dosing Weight: 93kg  Vital Signs: Temp: 100.4 F (38 C) (11/22 1400) BP: 108/53 (11/22 1509) Pulse Rate: 55 (11/22 1509)  Labs: Recent Labs    06/04/20 1148 06/04/20 1541 06/04/20 1803 06/04/20 1803 06/04/20 1805 06/05/20 0334 06/05/20 0841  HGB 17.3*   < > 16.1   < > 16.0 15.0  --   HCT 53.7*   < > 47.3  --  47.0 45.4  --   PLT 297  --  341  --   --  264  --   HEPARINUNFRC  --   --   --   --   --   --  0.17*  CREATININE 1.78*  --  1.62*  --   --  1.45*  --   TROPONINIHS 157*  --   --   --   --   --   --    < > = values in this interval not displayed.    Estimated Creatinine Clearance: 54.4 mL/min (A) (by C-G formula based on SCr of 1.45 mg/dL (H)).   Medical History: Past Medical History:  Diagnosis Date  . Borderline diabetic   . Coronary artery disease    a. s/p CABG in 2014 with LIMA-LAD, SVG-OM2 and SVG-RC b. 03/2019: STEMI and s/p angioplasty to proximal SVG-RCA and DES to SVG-RCA anastomosis c. 04/2020: recurrent STEMI with angioplasty of SVG-RCA  . Dyslipidemia 01/11/2013  . Hiatal hernia   . Hypertension   . Mini stroke ([place])    2  . S/P CABG x 3   . STEMI (ST elevation myocardial infarction) ([place])   . Stroke [facility])     Assessment: 21 yoM admitted with VT now s/p LHC with stable coronary disease. Pharmacy asked to start IV heparin 8h after sheath removal (1800) with concerns for LV thrombus on prior ECHO in 04/2020. ECHO on 11/22 revealed a filling defect that could be a thrombus or false tendon.   11/22 @0841  HL subtherapeutic at 0.17 at 1200 units/hr. CBC stable and no s/sx of bleeding. Anticipate patient needing increased rate to reach therapeutic levels. Patient being taken to EP lab today and heparin will be  stopped. Will follow up for any complications, bleeding, and restarting heparin once complete.   PM addendum - more stable this afternoon - procedure postponed - will resume heparin drip   Goal of Therapy:  Heparin level 0.3-0.7 units/ml Monitor platelets by anticoagulation protocol: Yes   Plan:  Resume heparin drip at 1500 uts/hr  Daily heparin level and CBC  Monitor s/s bleeding    Bonnita Nasuti Pharm.D. CPP, BCPS Clinical Pharmacist [phone] 06/05/2020 4:23 PM

## 2020-06-05 NOTE — Progress Notes (Signed)
ANTICOAGULATION CONSULT NOTE  Pharmacy Consult for heparin Indication: LV Thrombus  No Known Allergies  Patient Measurements: Height: 5\' 7"  (170.2 cm) Weight: 92.9 kg (204 lb 12.9 oz) IBW/kg (Calculated) : 66.1 Heparin Dosing Weight: 85.9kg  Vital Signs: Temp: 100.4 F (38 C) (11/22 1400) BP: 108/53 (11/22 1509) Pulse Rate: 55 (11/22 1509)  Labs: Recent Labs    06/04/20 1148 06/04/20 1541 06/04/20 1803 06/04/20 1803 06/04/20 1805 06/05/20 0334 06/05/20 0841  HGB 17.3*   < > 16.1   < > 16.0 15.0  --   HCT 53.7*   < > 47.3  --  47.0 45.4  --   PLT 297  --  341  --   --  264  --   HEPARINUNFRC  --   --   --   --   --   --  0.17*  CREATININE 1.78*  --  1.62*  --   --  1.45*  --   TROPONINIHS 157*  --   --   --   --   --   --    < > = values in this interval not displayed.    Estimated Creatinine Clearance: 54.4 mL/min (A) (by C-G formula based on SCr of 1.45 mg/dL (H)).   Medical History: Past Medical History:  Diagnosis Date  . Borderline diabetic   . Coronary artery disease    a. s/p CABG in 2014 with LIMA-LAD, SVG-OM2 and SVG-RC b. 03/2019: STEMI and s/p angioplasty to proximal SVG-RCA and DES to SVG-RCA anastomosis c. 04/2020: recurrent STEMI with angioplasty of SVG-RCA  . Dyslipidemia 01/11/2013  . Hiatal hernia   . Hypertension   . Mini stroke ([place])    2  . S/P CABG x 3   . STEMI (ST elevation myocardial infarction) ([place])   . Stroke [facility])     Assessment: 57 yoM admitted with VT now s/p LHC with stable coronary disease. Pharmacy asked to start IV heparin 8h after sheath removal (1800) with concerns for LV thrombus on prior ECHO in 04/2020. ECHO on 11/22 revealed a filling defect that could be a thrombus or false tendon.   Heparin level subtherapeutic this AM at 0.17 on 1200 units/hr. CBC stable. Heparin was turned off for possible EP procedure prior to rate being increased.  PM update - EP procedure cancelled per discussion with Arlyss Gandy, NP. She  requests Pharmacy to resume heparin now. Will resume at increased rate for previous low level not yet addressed. No bleeding issues per discussion with RN.  Goal of Therapy:  Heparin level 0.3-0.7 units/ml Monitor platelets by anticoagulation protocol: Yes   Plan:  No bolus. Resume heparin at increased rate 1450 units/hr Check 6hr heparin level Monitor daily CBC, s/sx bleeding Follow up heparin plan with MD in AM   Arturo Morton, PharmD, BCPS Please check AMION for all [facility] contact numbers Clinical Pharmacist 06/05/2020 4:22 PM

## 2020-06-05 NOTE — Progress Notes (Addendum)
Patient with intermittent/transient hypotension despite pressor/inotrope support Pacing rate was increased Propofol reduced norepi increased Cool feet b/l Dr. Quentin Ore discussed with Dr. Haroldine Laws and EP colleagues. SB 40-45bpm underlying with intact AV conduction Plan is to place A lead top provide AV synchrony in effort to improve CO and hemodynamics I called and spoke with the patient's wife, she is agreeable for [place] to proceed with temp-perm placement.  Tommye Standard, PA     Discussed personally with Dr. Haroldine Laws.  Procalcitonin level is pending.  Saw the patient.  Reduce his pacing rate to the 40s.  Intrinsic (augmented) sinus rate was in the low 50s.  Blood pressure was significantly better from the low 90s to the high 90s clearly discernible even from the waveform.  Discussed again with Dr. Haroldine Laws it was elected to continue to follow for now.  [id number]

## 2020-06-05 NOTE — Progress Notes (Signed)
  Echocardiogram 2D Echocardiogram with definity has been performed.  Darlina Sicilian M 06/05/2020, 11:35 AM

## 2020-06-05 NOTE — Progress Notes (Addendum)
ANTICOAGULATION CONSULT NOTE - Initial Consult  Pharmacy Consult for heparin Indication: LV Thrombus  No Known Allergies  Patient Measurements: Height: 5\' 7"  (170.2 cm) Weight: 92.9 kg (204 lb 12.9 oz) IBW/kg (Calculated) : 66.1 Heparin Dosing Weight: 93kg  Vital Signs: Temp: 100.2 F (37.9 C) (11/22 0900) Temp Source: Core (11/22 0400) BP: 102/66 (11/22 1111) Pulse Rate: 70 (11/22 1111)  Labs: Recent Labs    06/04/20 1148 06/04/20 1746 06/04/20 1803 06/04/20 1803 06/04/20 1805 06/05/20 0334 06/05/20 0841  HGB 17.3*   < > 16.1   < > 16.0 15.0  --   HCT 53.7*   < > 47.3  --  47.0 45.4  --   PLT 297  --  341  --   --  264  --   HEPARINUNFRC  --   --   --   --   --   --  0.17*  CREATININE 1.78*  --  1.62*  --   --  1.45*  --   TROPONINIHS 157*  --   --   --   --   --   --    < > = values in this interval not displayed.    Estimated Creatinine Clearance: 54.4 mL/min (A) (by C-G formula based on SCr of 1.45 mg/dL (H)).   Medical History: Past Medical History:  Diagnosis Date  . Borderline diabetic   . Coronary artery disease    a. s/p CABG in 2014 with LIMA-LAD, SVG-OM2 and SVG-RC b. 03/2019: STEMI and s/p angioplasty to proximal SVG-RCA and DES to SVG-RCA anastomosis c. 04/2020: recurrent STEMI with angioplasty of SVG-RCA  . Dyslipidemia 01/11/2013  . Hiatal hernia   . Hypertension   . Mini stroke ([place])    2  . S/P CABG x 3   . STEMI (ST elevation myocardial infarction) ([place])   . Stroke [facility])     Assessment: 41 yoM admitted with VT now s/p LHC with stable coronary disease. Pharmacy asked to start IV heparin 8h after sheath removal (1800) with concerns for LV thrombus on prior ECHO in 04/2020. ECHO on 11/22 revealed a filling defect that could be a thrombus or false tendon.   11/22 @0841  HL subtherapeutic at 0.17 at 1200 units/hr. CBC stable and no s/sx of bleeding. Anticipate patient needing increased rate to reach therapeutic levels. Patient being taken to EP  lab today and heparin will be stopped. Will follow up for any complications, bleeding, and restarting heparin once complete. Would recommend resuming 1200 unit/hr rate when restarting to decrease the risk of bleeding post operatively and then increase to goal from there.   Goal of Therapy:  Heparin level 0.3-0.7 units/ml Monitor platelets by anticoagulation protocol: Yes   Plan:  -Stop heparin for procedure -Follow up after procedure to restart heparin -Follow up with MD thrombus vs tendon and need for heparin and anticoagulation   Jacobo Forest PharmD Candidate 2022 06/05/2020 1:03 PM

## 2020-06-05 NOTE — Progress Notes (Addendum)
Progress Note  Patient Name: [name] Date of Encounter: 06/05/2020  [facility] Cardiologist: Sanda Klein, MD   Subjective   Intubated/sedated  Inpatient Medications    Scheduled Meds: . aspirin EC  81 mg Oral Daily  . atorvastatin  80 mg Per Tube q1800  . chlorhexidine gluconate (MEDLINE KIT)  15 mL Mouth Rinse BID  . Chlorhexidine Gluconate Cloth  6 each Topical Daily  . Chlorhexidine Gluconate Cloth  6 each Topical Daily  . clopidogrel  75 mg Per Tube Daily  . docusate  100 mg Per Tube BID  . escitalopram  10 mg Per Tube Daily  . ezetimibe  10 mg Per Tube Daily  . fentaNYL (SUBLIMAZE) injection  25 mcg Intravenous Once  . mouth rinse  15 mL Mouth Rinse 10 times per day  . metoprolol tartrate  37.5 mg Per Tube BID  . pantoprazole (PROTONIX) IV  40 mg Intravenous Daily  . polyethylene glycol  17 g Per Tube Daily  . sodium chloride flush  10-40 mL Intracatheter Q12H  . sodium chloride flush  3 mL Intravenous Q12H   Continuous Infusions: . sodium chloride    . sodium chloride Stopped (06/05/20 0020)  . sodium chloride Stopped (06/05/20 0452)  . amiodarone 30 mg/hr (06/05/20 0700)  . fentaNYL infusion INTRAVENOUS 100 mcg/hr (06/05/20 0700)  . heparin 1,200 Units/hr (06/05/20 0700)  . lidocaine Stopped (06/04/20 1321)  . norepinephrine (LEVOPHED) Adult infusion 14 mcg/min (06/05/20 0700)  . propofol (DIPRIVAN) infusion 40 mcg/kg/min (06/05/20 0700)   PRN Meds: sodium chloride, sodium chloride, sodium chloride, acetaminophen (TYLENOL) oral liquid 160 mg/5 mL, ALPRAZolam, fentaNYL, nitroGLYCERIN, ondansetron (ZOFRAN) IV, sodium chloride flush, sodium chloride flush   Vital Signs    Vitals:   06/05/20 0500 06/05/20 0556 06/05/20 0600 06/05/20 0700  BP: 98/62  (!) 86/68 101/84  Pulse: 79  (!) 52   Resp: (!) 22  (!) 22 (!) 22  Temp: (!) 100.4 F (38 C)  100.2 F (37.9 C) 100.2 F (37.9 C)  TempSrc:      SpO2: 99%  98%   Weight:  92.9 kg    Height:         Intake/Output Summary (Last 24 hours) at 06/05/2020 0740 Last data filed at 06/05/2020 0700 Gross per 24 hour  Intake 3214.21 ml  Output 515 ml  Net 2699.21 ml   Last 3 Weights 06/05/2020 06/04/2020 05/26/2020  Weight (lbs) 204 lb 12.9 oz 205 lb 14.6 oz 206 lb  Weight (kg) 92.9 kg 93.4 kg 93.441 kg      Telemetry    V paced - Personally Reviewed  ECG    No new EKGs - Personally Reviewed  Physical Exam   GEN: intubated/sedated Neck: No JVD Cardiac: RRR, no murmurs, rubs, or gallops, feet are cool b/l Respiratory: intubated, lungs are clear. GI: Soft, nontender, non-distended  MS: No edema; No deformity. Neuro:  unable to assess  Psych: unable to assess   Labs    High Sensitivity Troponin:   Recent Labs  Lab 06/04/20 1148  TROPONINIHS 157*      Chemistry Recent Labs  Lab 06/04/20 1148 06/04/20 1148 06/04/20 1746 06/04/20 1803 06/04/20 1805 06/05/20 0334  NA 139   < > 140  --  140 139  K 4.2   < > 4.5  --  4.3 3.9  CL 104  --   --   --   --  109  CO2 22  --   --   --   --  19*  GLUCOSE 143*  --   --   --   --  124*  BUN 20  --   --   --   --  20  CREATININE 1.78*  --   --  1.62*  --  1.45*  CALCIUM 9.0  --   --   --   --  7.9*  PROT 7.3  --   --   --   --   --   ALBUMIN 3.9  --   --   --   --   --   AST 26  --   --   --   --   --   ALT 22  --   --   --   --   --   ALKPHOS 66  --   --   --   --   --   BILITOT 1.1  --   --   --   --   --   GFRNONAA 42*  --   --  47*  --  53*  ANIONGAP 13  --   --   --   --  11   < > = values in this interval not displayed.     Hematology Recent Labs  Lab 06/04/20 1148 06/04/20 1746 06/04/20 1803 06/04/20 1805 06/05/20 0334  WBC 6.5  --  21.1*  --  16.1*  RBC 5.64  --  5.10  --  4.87  HGB 17.3*   < > 16.1 16.0 15.0  HCT 53.7*   < > 47.3 47.0 45.4  MCV 95.2  --  92.7  --  93.2  MCH 30.7  --  31.6  --  30.8  MCHC 32.2  --  34.0  --  33.0  RDW 12.6  --  12.6  --  12.9  PLT 297  --  341  --  264   < >  = values in this interval not displayed.    BNPNo results for input(s): BNP, PROBNP in the last 168 hours.   DDimer No results for input(s): DDIMER in the last 168 hours.   Radiology     DG Chest Port 1 View Result Date: 06/04/2020 CLINICAL DATA:  Intubation EXAM: PORTABLE CHEST 1 VIEW COMPARISON:  Same day chest radiograph FINDINGS: Interval placement of endotracheal tube with distal tip terminating approximately 3.1 cm above the carina. Interval placement of enteric tube which courses below the diaphragm with distal tip beyond the inferior margin of the film. Prior median sternotomy. Multiple overlying cardiac leads and pacer pad. Stable cardiomegaly. Left basilar atelectasis. No evidence of pneumothorax. IMPRESSION: 1. Interval placement of endotracheal and enteric tubes, as described. 2. Left basilar atelectasis. Electronically Signed   By: Nicholas  Plundo D.O.   On: 06/04/2020 14:44   DG Chest Port 1 View Result Date: 06/04/2020 CLINICAL DATA:  64-year-old male with chest pain, ventricular tachycardia. EXAM: PORTABLE CHEST 1 VIEW COMPARISON:  Portable chest 03/23/2019 and earlier. FINDINGS: Portable AP semi upright view at 1140 hours. Pacer or resuscitation pads project over the chest. Stable mild cardiomegaly. Prior sternotomy. Other mediastinal contours are within normal limits. Visualized tracheal air column is within normal limits. Allowing for portable technique the lungs are clear. No pneumothorax or pleural effusion. No acute osseous abnormality identified. IMPRESSION: Stable mild cardiomegaly.  No acute cardiopulmonary abnormality. Electronically Signed   By: H  Hall M.D.   On: 06/04/2020 12:26    Cardiac Studies    06/04/2020:   R/LHC  Mid LAD lesion is 100% stenosed.  Prox Cx lesion is 100% stenosed.  Prox RCA lesion is 100% stenosed.  Dist RCA lesion is 20% stenosed.  Non-stenotic Origin to Prox Graft lesion was previously treated.  Prox Graft lesion is 30%  stenosed.  LV end diastolic pressure is mildly elevated.   1. Severe 3 vessel occlusive CAD. 2. Patent LIMA to the LAD 3. Patent SVG to OM1 4. Patent SVG to RCA. Much improved flow from prior PCI. 5. Mildly elevated LVEDP 17 mm Hg 6. Successful placement of temporary transvenous pacemaker.  Plan: Dr Haroldine Laws placed a [name] catheter via the right internal jugular Vein. Will leave temporary transvenous pacemaker in place so that antiarrhythmic drugs can be used liberally.  Right heart cath demonstrates good cardiac output and right heart pressures so we deferred placing an IABP. Will need right femoral arterial sheath removed manually.  RHC Findings:  Ao = 174/111 (138)  LV = 169/16 RA =  7 RV = 41/6 PA = 37/20 (28) PCW = 14 Fick cardiac output/index = 11.6/5.7 PVR = 1.2 WU FA sat = 99% PA sat = 85%, 86% SVC sat = 89%  High cardiac output with normal filling pressures. No evidence of intracardiac shunting.     05/04/2020: TTE IMPRESSIONS  1. Since the last study on 06/23/2019 LVEF has decreased from 35-40% to  25-30%, there is a suspicion for left ventricular apical thrombus  measuring 14 x 6 mm.  2. Left ventricular ejection fraction, by estimation, is 25 to 30%. The  left ventricle has severely decreased function. The left ventricle  demonstrates global hypokinesis. The left ventricular internal cavity size  was moderately dilated. Left  ventricular diastolic parameters are consistent with Grade I diastolic  dysfunction (impaired relaxation). Elevated left atrial pressure.  3. Right ventricular systolic function is moderately reduced. The right  ventricular size is moderately enlarged. There is mildly elevated  pulmonary artery systolic pressure. The estimated right ventricular  systolic pressure is 76.7 mmHg.  4. Left atrial size was moderately dilated.  5. Right atrial size was moderately dilated.  6. The mitral valve is normal in structure. Mild mitral  valve  regurgitation. No evidence of mitral stenosis.  7. The aortic valve is normal in structure. Aortic valve regurgitation is  mild. No aortic stenosis is present.  8. The inferior vena cava is normal in size with greater than 50%  respiratory variability, suggesting right atrial pressure of 3 mmHg.    Patient Profile     63 y.o. male with aPMH of CAD (s/p CABG in 2014 with LIMA-LAD, SVG-OM2 and SVG-RCA, s/p angioplasty to proximal SVG-RCA and DES to SVG-RCA anastomosisin 03/2019), chronic combined systolic and diastolic CHF/ICM (EF 34-19% by echo in 06/2019), HTN, HLD,prior CVAand recent STEMIwith angioplasty of the distal bypass graft10/21/21  [place]S [place] by [place]/VT treated with amio bolus' repeat bolus in ER >> asystole >> external pacing>> emergent cath/temp wire placement, intubated sedated.  Assessment & Plan   Dr. Quentin Ore has seen the patient this AM    . VT arrest -> asystole - emergent cath 06/04/20 with stable coronary anatomy - suspect scar mediated VT - Continue amio gtt   Off lido already - will need ICD (+/- VT ablation) prior to d/c, pending today/tomorrow, perhaps implant Wed - Keep K > 4.0 Mg > 2.0  RV pacing overnight Got K+ and mag replacement this AM  2. Chronic systolic HF  with biventricular failure -> cardiogenic shock  - Echo 05/04/20 EF 25%. Moderate RV to severe RV dysfunction and possible LV thrombus. LifeVest placed. D/c'd home on 05/06/20 - RHC on admit with preserved output and well compensated filling pressures.  - Repeat echo is pending  - Resume GDMT when able, stop metoprolol for now given levo/hypotension  3. CAD  - s/p CABG  - Inferior STEMI 05/03/20 s/p POBA SVG->PDA - Emergent cath 06/04/20 with stable coronary anatomy - on DAPT/statin/zetia  4. Possible LV thrombus on echo 05/04/20 - on heparin gtt  New echo pending H/H, plts stable  5. Acute hypoxic respiratory failure -  intubated in setting of cardiac arrest - CCM assist appreciated Wean pressor as able/per CCM Wean vent as able/per CCM  6. Low grade temp     WBC trending down to 16     Likely reactive, follow  7. AKI/CKD     Creat trending down, today is 1.45  For questions or updates, please contact [place] Please consult [email or web address] for contact info under        Signed, Baldwin [place], PA-C  06/05/2020, 7:40 AM

## 2020-06-06 ENCOUNTER — Inpatient Hospital Stay (HOSPITAL_COMMUNITY): Payer: Medicare HMO

## 2020-06-06 DIAGNOSIS — I5022 Chronic systolic (congestive) heart failure: Secondary | ICD-10-CM | POA: Diagnosis not present

## 2020-06-06 DIAGNOSIS — R570 Cardiogenic shock: Secondary | ICD-10-CM | POA: Diagnosis not present

## 2020-06-06 DIAGNOSIS — J9601 Acute respiratory failure with hypoxia: Secondary | ICD-10-CM | POA: Diagnosis not present

## 2020-06-06 DIAGNOSIS — I472 Ventricular tachycardia: Secondary | ICD-10-CM | POA: Diagnosis not present

## 2020-06-06 DIAGNOSIS — I2510 Atherosclerotic heart disease of native coronary artery without angina pectoris: Secondary | ICD-10-CM | POA: Diagnosis not present

## 2020-06-06 LAB — EXPECTORATED SPUTUM ASSESSMENT W GRAM STAIN, RFLX TO RESP C: Special Requests: NORMAL

## 2020-06-06 LAB — LACTIC ACID, PLASMA
Lactic Acid, Venous: 1.3 mmol/L (ref 0.5–1.9)
Lactic Acid, Venous: 0.9 mmol/L (ref 0.5–1.9)

## 2020-06-06 LAB — POCT I-STAT 7, (LYTES, BLD GAS, ICA,H+H)
Acid-base deficit: 9 mmol/L — ABNORMAL HIGH (ref 0.0–2.0)
Acid-base deficit: 2 mmol/L (ref 0.0–2.0)
Bicarbonate: 21.3 mmol/L (ref 20.0–28.0)
Bicarbonate: 24 mmol/L (ref 20.0–28.0)
Calcium, Ion: 1.09 mmol/L — ABNORMAL LOW (ref 1.15–1.40)
Calcium, Ion: 1.18 mmol/L (ref 1.15–1.40)
HCT: 39 % (ref 39.0–52.0)
HCT: 41 % (ref 39.0–52.0)
Hemoglobin: 13.3 g/dL (ref 13.0–17.0)
Hemoglobin: 13.9 g/dL (ref 13.0–17.0)
O2 Saturation: 88 %
O2 Saturation: 100 %
Patient temperature: 98
Potassium: 4 mmol/L (ref 3.5–5.1)
Potassium: 5.4 mmol/L — ABNORMAL HIGH (ref 3.5–5.1)
Sodium: 144 mmol/L (ref 135–145)
Sodium: 139 mmol/L (ref 135–145)
TCO2: 23 mmol/L (ref 22–32)
TCO2: 25 mmol/L (ref 22–32)
pCO2 arterial: 65.2 mmHg (ref 32.0–48.0)
pCO2 arterial: 45.2 mmHg (ref 32.0–48.0)
pH, Arterial: 7.121 — CL (ref 7.350–7.450)
pH, Arterial: 7.331 — ABNORMAL LOW (ref 7.350–7.450)
pO2, Arterial: 74 mmHg — ABNORMAL LOW (ref 83.0–108.0)
pO2, Arterial: 179 mmHg — ABNORMAL HIGH (ref 83.0–108.0)

## 2020-06-06 LAB — COOXEMETRY PANEL
Carboxyhemoglobin: 0.9 % (ref 0.5–1.5)
Methemoglobin: 0.9 % (ref 0.0–1.5)
O2 Saturation: 72.3 %
Total hemoglobin: 14 g/dL (ref 12.0–16.0)

## 2020-06-06 LAB — CBC
HCT: 43.8 % (ref 39.0–52.0)
Hemoglobin: 14.1 g/dL (ref 13.0–17.0)
MCH: 30.7 pg (ref 26.0–34.0)
MCHC: 32.2 g/dL (ref 30.0–36.0)
MCV: 95.4 fL (ref 80.0–100.0)
Platelets: 182 10*3/uL (ref 150–400)
RBC: 4.59 MIL/uL (ref 4.22–5.81)
RDW: 12.8 % (ref 11.5–15.5)
WBC: 16.3 10*3/uL — ABNORMAL HIGH (ref 4.0–10.5)
nRBC: 0 % (ref 0.0–0.2)

## 2020-06-06 LAB — GLUCOSE, CAPILLARY
Glucose-Capillary: 131 mg/dL — ABNORMAL HIGH (ref 70–99)
Glucose-Capillary: 111 mg/dL — ABNORMAL HIGH (ref 70–99)

## 2020-06-06 LAB — BASIC METABOLIC PANEL
Anion gap: 13 (ref 5–15)
BUN: 16 mg/dL (ref 8–23)
CO2: 19 mmol/L — ABNORMAL LOW (ref 22–32)
Calcium: 7.9 mg/dL — ABNORMAL LOW (ref 8.9–10.3)
Chloride: 105 mmol/L (ref 98–111)
Creatinine, Ser: 1.52 mg/dL — ABNORMAL HIGH (ref 0.61–1.24)
GFR, Estimated: 50 mL/min — ABNORMAL LOW (ref >60)
Glucose, Bld: 119 mg/dL — ABNORMAL HIGH (ref 70–99)
Potassium: 4.1 mmol/L (ref 3.5–5.1)
Sodium: 137 mmol/L (ref 135–145)

## 2020-06-06 LAB — PROCALCITONIN: Procalcitonin: 0.21 ng/mL

## 2020-06-06 LAB — HEPARIN LEVEL (UNFRACTIONATED)
Heparin Unfractionated: 0.31 IU/mL (ref 0.30–0.70)
Heparin Unfractionated: 0.36 IU/mL (ref 0.30–0.70)

## 2020-06-06 LAB — MAGNESIUM: Magnesium: 2 mg/dL (ref 1.7–2.4)

## 2020-06-06 LAB — TRIGLYCERIDES: Triglycerides: 91 mg/dL (ref <150)

## 2020-06-06 MED ORDER — ETOMIDATE 2 MG/ML IV SOLN
INTRAVENOUS | Status: AC
  Administered 2020-06-06: 20 mg
  Filled 2020-06-06: qty 20

## 2020-06-06 MED ORDER — SODIUM CHLORIDE 0.9 % IV SOLN
2.0000 g | Freq: Two times a day (BID) | INTRAVENOUS | Status: DC
  Administered 2020-06-06 – 2020-06-08 (×6): 2 g via INTRAVENOUS
  Filled 2020-06-06 (×6): qty 2

## 2020-06-06 MED ORDER — VANCOMYCIN HCL 750 MG/150ML IV SOLN
750.0000 mg | Freq: Two times a day (BID) | INTRAVENOUS | Status: DC
  Administered 2020-06-06 – 2020-06-07 (×3): 750 mg via INTRAVENOUS
  Filled 2020-06-06 (×5): qty 150

## 2020-06-06 MED ORDER — FENTANYL CITRATE (PF) 100 MCG/2ML IJ SOLN
INTRAMUSCULAR | Status: AC
  Filled 2020-06-06: qty 2

## 2020-06-06 MED ORDER — MIDAZOLAM HCL 2 MG/2ML IJ SOLN
INTRAMUSCULAR | Status: AC
  Filled 2020-06-06: qty 4

## 2020-06-06 MED ORDER — ROCURONIUM BROMIDE 10 MG/ML (PF) SYRINGE
PREFILLED_SYRINGE | INTRAVENOUS | Status: AC
  Administered 2020-06-06: 80 mg
  Filled 2020-06-06: qty 10

## 2020-06-06 MED ORDER — SODIUM CHLORIDE 0.9 % IV BOLUS
250.0000 mL | Freq: Once | INTRAVENOUS | Status: AC
  Administered 2020-06-06: 250 mL via INTRAVENOUS

## 2020-06-06 MED ORDER — FENTANYL 2500MCG IN NS 250ML (10MCG/ML) PREMIX INFUSION
25.0000 ug/h | INTRAVENOUS | Status: DC
  Administered 2020-06-06: 100 ug/h via INTRAVENOUS
  Administered 2020-06-07: 50 ug/h via INTRAVENOUS
  Filled 2020-06-06 (×2): qty 250

## 2020-06-06 MED ORDER — PROPOFOL 1000 MG/100ML IV EMUL
5.0000 ug/kg/min | INTRAVENOUS | Status: DC
  Administered 2020-06-06: 25 ug/kg/min via INTRAVENOUS
  Administered 2020-06-07: 30 ug/kg/min via INTRAVENOUS
  Administered 2020-06-07: 15 ug/kg/min via INTRAVENOUS
  Administered 2020-06-07: 40 ug/kg/min via INTRAVENOUS
  Administered 2020-06-07: 20 ug/kg/min via INTRAVENOUS
  Administered 2020-06-08 – 2020-06-09 (×5): 30 ug/kg/min via INTRAVENOUS
  Administered 2020-06-09: 15 ug/kg/min via INTRAVENOUS
  Filled 2020-06-06 (×12): qty 100

## 2020-06-06 NOTE — Progress Notes (Signed)
NAME:  [name], MRN:  [id number], DOB:  04-16-1956, LOS: 2 ADMISSION DATE:  06/04/2020, CONSULTATION DATE:  06/04/2020 REFERRING MD:  Dr. Haroldine Laws, CHIEF COMPLAINT:  VT cardiac arrest  Brief History   33 yoM with extensive cardiac history with recent STEMI 04/2020, with recurrent VT who developed severe bradycardia on amiodarone and lidocaine gtt requiring external pacing.  Intubated and emergently taken to cath lab for TVP, PA catheter, and R/LHC.  PCCM consulted for ventilator management.   History of present illness   HPI obtained from medical chart review as patient is sedated and intubated on mechanical ventilation.   64 year old male with history of CAD w/ previous CABG in 2014, chronic combined systolic and diastolic HF/ ICM, HTN, HLD, CVA, and recent inferior STEMI 05/03/20 secondary to thrombotic occlusion of the SVG to the RCA, likely due to missed ticagrelor doses.  EF noted to be 25% with moderate RV to severe RV dysfunction and possible LV thrombus 05/04/2020.  He was discharged home on 10/23 on a LifeVest, but apparently not using.    On 11/21, he developed dizziness at church.  EMS found patient in wide complex tachycardia treated with amiodarone and lidocaine with successful conversion to NSR.  On arrival to ER, patient had recurrence of his VT but hemodynamically tolerating.  He was given additional amiodarone and lidocaine gtt started, however developed severe bradycardia requiring atropine and external pacing.  He was intubated and taken emergently for temporary transvenous pacemaker and coronary angiogram.  Returns to ICU sedated and intubated, PCCM consulted for further ventilator management.   Past Medical History  CAD (s/p CABG in 2014 with LIMA-LAD, SVG-OM2 and SVG-RCA, s/p angioplasty to proximal SVG-RCA and DES to SVG-RCA anastomosisin 03/2019), chronic combined systolic and diastolic CHF/ICM (EF 97-98% by echo in 06/2019), HTN, HLD,prior CVA  Significant  Hospital Events   11/21 Admitted cardiology/ intubated/ L/RHC, TVP, PA catheter  Consults:  HF CCM  Procedures:  11/21 ETT >> 11/21 R IJ cordis with PA catheter >> 11/21 R femoral TVP >> 11/21 R radial aline >>  Significant Diagnostic Tests:  11/21 R/LHC >   Severe 3 vessel occlusive CAD. 2. Patent LIMA to the LAD 3. Patent SVG to OM1 4. Patent SVG to RCA. Much improved flow from prior PCI. 5. Mildly elevated LVEDP 17 mm Hg 6. Successful placement of temporary transvenous pacemaker.  Micro Data:  11/21 SARS2/ flu >>neg  Antimicrobials:  n/a  Interim history/subjective:  Fevers overnight and respiratory secretions prompted the initiation of antibiotics. Tolerating sedation wean this morning without increased ectopy.   Objective   Blood pressure 117/71, pulse (!) 56, temperature (!) 100.6 F (38.1 C), resp. rate (!) 22, height 5\' 7"  (1.702 m), weight 93.5 kg, SpO2 96 %. PAP: (33-66)/(10-35) 42/13 CVP:  [0 mmHg-20 mmHg] 1 mmHg CO:  [3.5 L/min-4.6 L/min] 4.6 L/min CI:  [1.7 L/min/m2-2.3 L/min/m2] 2.3 L/min/m2  Vent Mode: PRVC FiO2 (%):  [40 %] 40 % Set Rate:  [22 bmp] 22 bmp Vt Set:  [520 mL] 520 mL PEEP:  [5 cmH20] 5 cmH20 Pressure Support:  [14 cmH20] 14 cmH20 Plateau Pressure:  [15 cmH20-23 cmH20] 23 cmH20   Intake/Output Summary (Last 24 hours) at 06/06/2020 0757 Last data filed at 06/06/2020 0700 Gross per 24 hour  Intake 1097.19 ml  Output 439 ml  Net 658.19 ml   Filed Weights   06/04/20 1135 06/05/20 0556 06/06/20 0250  Weight: 93.4 kg 92.9 kg 93.5 kg   Examination: General:  Adult male of normal body habitus in NAD HEENT: [place]/AT, PERRL, no JVD Neuro: RASS -1, follows commands.  CV: paced, RRR, no MRG PULM:  Clear, purulent respiratory secretions.  GI: soft, non-distended, non-tender Extremities: warm/dry, no LE edema  Skin: no rashes  Resolved Hospital Problem list    Assessment & Plan:   Acute hypoxic respiratory failure - full MV support, PRVC 8  cc/kg  - SAT/SBT today. Proceed with extubation if indicated.  - If no extubation today, will start TF  Possible pneumonia  - Cefepime/Vanco per primary - Cultures - Low threshold to narrow for CAP  Recurrent VT, felt to be scar mediated.  Cardiogenic shock Chronic systolic/ diastolic HF with biventricular failure ICM  CAD s/p CABG with recent inferior STEMI 04/2020 HTN s/p emergent R/LHC with patent grafts, preserved output and compensated filling pressures - Hemodynamics per Cardiology / HF team/ EP - Amiodarone infusion - NE for MAP goal > 65 (currently 14 mcg, with room to wean) - Milrinone infusion - goal K > 4/ Mag > 2   Possible LV thrombus, based on 05/04/20 TTE - heparin infusion per pharmacy  AKI on CKD stage III, baseline sCr 1.3-1.4. Worsening.  - foley - hemodynamic support as above - trend UOP/ renal indices - avoid nephrotoxins, low threshold to DC vanco given negative MRSA screen.   Best practice:  Diet: NPO Pain/Anxiety/Delirium protocol (if indicated): propofol/ fentanyl gtt VAP protocol (if indicated): yes DVT prophylaxis: heparin infusion GI prophylaxis: PPI Glucose control: add SSI if > 180 Mobility: BR Code Status: full  Family Communication: per primary  Disposition: ICU  Labs   CBC: Recent Labs  Lab 06/04/20 1148 06/04/20 1541 06/04/20 1746 06/04/20 1803 06/04/20 1805 06/05/20 0334 06/06/20 0450  WBC 6.5  --   --  21.1*  --  16.1* 16.3*  NEUTROABS 1.5*  --   --   --   --   --   --   HGB 17.3*   < > 16.7 16.1 16.0 15.0 14.1  HCT 53.7*   < > 49.0 47.3 47.0 45.4 43.8  MCV 95.2  --   --  92.7  --  93.2 95.4  PLT 297  --   --  341  --  264 182   < > = values in this interval not displayed.    Basic Metabolic Panel: Recent Labs  Lab 06/04/20 1148 06/04/20 1541 06/04/20 1553 06/04/20 1746 06/04/20 1803 06/04/20 1805 06/05/20 0334 06/06/20 0450  NA 139   < > 140 140  --  140 139 137  K 4.2   < > 3.8 4.5  --  4.3 3.9 4.1  CL  104  --   --   --   --   --  109 105  CO2 22  --   --   --   --   --  19* 19*  GLUCOSE 143*  --   --   --   --   --  124* 119*  BUN 20  --   --   --   --   --  20 16  CREATININE 1.78*  --   --   --  1.62*  --  1.45* 1.52*  CALCIUM 9.0  --   --   --   --   --  7.9* 7.9*  MG  --   --   --   --   --   --  1.6* 2.0   < > =  values in this interval not displayed.   GFR: Estimated Creatinine Clearance: 52.1 mL/min (A) (by C-G formula based on SCr of 1.52 mg/dL (H)). Recent Labs  Lab 06/04/20 1148 06/04/20 1803 06/05/20 0334 06/05/20 0839 06/05/20 1234 06/06/20 0450  PROCALCITON  --   --   --   --  0.22  --   WBC 6.5 21.1* 16.1*  --   --  16.3*  LATICACIDVEN  --   --   --  1.0  --   --     Liver Function Tests: Recent Labs  Lab 06/04/20 1148  AST 26  ALT 22  ALKPHOS 66  BILITOT 1.1  PROT 7.3  ALBUMIN 3.9   No results for input(s): LIPASE, AMYLASE in the last 168 hours. No results for input(s): AMMONIA in the last 168 hours.  ABG    Component Value Date/Time   PHART 7.418 06/04/2020 1805   PCO2ART 32.6 06/04/2020 1805   PO2ART 241 (H) 06/04/2020 1805   HCO3 21.2 06/04/2020 1805   TCO2 22 06/04/2020 1805   ACIDBASEDEF 3.0 (H) 06/04/2020 1805   O2SAT 72.3 06/06/2020 0455     Coagulation Profile: No results for input(s): INR, PROTIME in the last 168 hours.  Cardiac Enzymes: No results for input(s): CKTOTAL, CKMB, CKMBINDEX, TROPONINI in the last 168 hours.  HbA1C: Hgb A1c MFr Bld  Date/Time Value Ref Range Status  05/03/2020 11:30 PM 6.1 (H) 4.8 - 5.6 % Final    Comment:    (NOTE)         Prediabetes: 5.7 - 6.4         Diabetes: >6.4         Glycemic control for adults with diabetes: <7.0   03/25/2019 02:39 AM 5.6 4.8 - 5.6 % Final    Comment:    (NOTE)         Prediabetes: 5.7 - 6.4         Diabetes: >6.4         Glycemic control for adults with diabetes: <7.0     CBG: No results for input(s): GLUCAP in the last 168 hours.    Critical care time: 38  mins     Georgann Housekeeper, AGACNP-BC [place] for personal pager PCCM on call pager [phone]  06/06/2020 7:57 AM

## 2020-06-06 NOTE — Progress Notes (Signed)
Patient became increasingly lethargic this evening. Patient placed on BiPAP from 1650 to 1750 and obtained follow-up ABG. Results as follows: pH-7.12, CO2-65.2, pO2-74, Bicarb-21.3, Base Deficit of 9. Agarwala, MD, made aware. Pt then had event where O2 saturation dropped into the 50s. I then sternal rubbed the pt with minimal arousal but was able to increase O2 saturations back into the upper 90s. Tamala Julian, MD, came to bedside to assess pt. Patient was re-intubated by Tamala Julian, MD.

## 2020-06-06 NOTE — Progress Notes (Signed)
RN called RT to assist pt at bedside. RT at bedside at this time. Pt seems to be more confused hard to arouse. Pt is unable to cough up secretions. RT NT suction pt with no response. MD saw pt and verbally stated to place pt on BiPAP and to draw an ABG.

## 2020-06-06 NOTE — Progress Notes (Signed)
Patient with almost no urine output overnight. While this RN in patient's room, patient noted to be urinating around foley catheter. Bladder scan 453 mls. Attempted to flush and then irrigate foley without success.  Foley removed at this time. Immediately upon removal, patient urinated 150 mls - small blood clots present. Foley catheter reinserted per CCM request. 350 mls amber urine drained into collection bag. MD aware. Will continue to closely monitor and bladder scan prn.  Joellen Jersey, RN

## 2020-06-06 NOTE — Progress Notes (Addendum)
Advanced Heart Failure Rounding Note  PCP-Cardiologist: Sanda Klein, MD   Subjective:    Admitted for VT arrest in setting of severe ICM; taken to cath found to have stable anatomy and preserved output.     Yesterday: On Levo, Amio 30, hep gtt; CVP 8; CI 1.34; only 415 mL UOP. Started on milrinone despite co-ox 60-66%. RN overnight decrease UOP only 85m at MN, called fellow and gave 250 bolus with no change in output.   Today: Wakes to verbal stimuli and follows commands, on NE 14, milrinone 0.125, amio 30 CI 3.2, CVP 12. dPAP 22, coarse to RLL and thick sputum in ETT; febrile Tmax 101.5  Swan #s RA 12 PA 48/22  Thermo 6.6/3.2 SVR 1090   Objective:   Weight Range: 93.5 kg Body mass index is 32.28 kg/m.   Vital Signs:   Temp:  [99.7 F (37.6 C)-101.5 F (38.6 C)] 100.6 F (38.1 C) (11/23 0500) Pulse Rate:  [52-88] 62 (11/23 0500) Resp:  [11-27] 22 (11/23 0500) BP: (77-124)/(19-84) 116/73 (11/23 0500) SpO2:  [86 %-100 %] 98 % (11/23 0515) Arterial Line BP: (68-142)/(42-73) 116/52 (11/23 0515) FiO2 (%):  [40 %] 40 % (11/23 0400) Weight:  [93.5 kg] 93.5 kg (11/23 0250) Last BM Date:  (pta)  Weight change: Filed Weights   06/04/20 1135 06/05/20 0556 06/06/20 0250  Weight: 93.4 kg 92.9 kg 93.5 kg    Intake/Output:   Intake/Output Summary (Last 24 hours) at 06/06/2020 0644 Last data filed at 06/05/2020 2339 Gross per 24 hour  Intake 1210.83 ml  Output 264 ml  Net 946.83 ml      Physical Exam    CVP 12 General:  Intubated.  HEENT: normal.  Neck: supple. no JVD. Carotids 2+ bilat; no bruits.  Cor: PMI nondisplaced. Regular rate & rhythm. No rubs, gallops or murmurs. Lungs: Coarse to RLL with thick tan secretions in ETT.  Abdomen: Abdomen distended with hypoactive BS.  Extremities: no cyanosis, clubbing, rash, edema.  Cool extremities but improving in temperature and in color.  Neuro: alert to verbal stimuli. Moves upper extremities w/o difficulty.    Telemetry   SR w/ occasional PVC. Rates in the 79s Personally reviewed.    EKG    Wide complex on EKG 11/21 at 11:54  Labs    CBC Recent Labs    06/04/20 1148 06/04/20 1541 06/05/20 0334 06/06/20 0450  WBC 6.5   < > 16.1* 16.3*  NEUTROABS 1.5*  --   --   --   HGB 17.3*   < > 15.0 14.1  HCT 53.7*   < > 45.4 43.8  MCV 95.2   < > 93.2 95.4  PLT 297   < > 264 182   < > = values in this interval not displayed.   Basic Metabolic Panel Recent Labs    06/05/20 0334 06/06/20 0450  NA 139 137  K 3.9 4.1  CL 109 105  CO2 19* 19*  GLUCOSE 124* 119*  BUN 20 16  CREATININE 1.45* 1.52*  CALCIUM 7.9* 7.9*  MG 1.6* 2.0   Liver Function Tests Recent Labs    06/04/20 1148  AST 26  ALT 22  ALKPHOS 66  BILITOT 1.1  PROT 7.3  ALBUMIN 3.9   No results for input(s): LIPASE, AMYLASE in the last 72 hours. Cardiac Enzymes No results for input(s): CKTOTAL, CKMB, CKMBINDEX, TROPONINI in the last 72 hours.  BNP: BNP (last 3 results) Recent Labs    05/03/20  2330 05/04/20 0536  BNP 1,169.8* 1,408.4*    ProBNP (last 3 results) No results for input(s): PROBNP in the last 8760 hours.   D-Dimer No results for input(s): DDIMER in the last 72 hours. Hemoglobin A1C No results for input(s): HGBA1C in the last 72 hours. Fasting Lipid Panel Recent Labs    06/06/20 0450  TRIG 91   Thyroid Function Tests No results for input(s): TSH, T4TOTAL, T3FREE, THYROIDAB in the last 72 hours.  Invalid input(s): FREET3  Other results:   Imaging    ECHOCARDIOGRAM COMPLETE  Result Date: 06/05/2020    ECHOCARDIOGRAM REPORT   Patient Name:   [name] Date of Exam: 06/05/2020 Medical Rec #:  [id number]      Height:       67.0 in Accession #:    [id number]     Weight:       204.8 lb Date of Birth:  [birth year]     BSA:          2.043 m Patient Age:    64 years       BP:           83/61 mmHg Patient Gender: M              HR:           70 bpm. Exam Location:  Inpatient  Procedure: 2D Echo, Intracardiac Opacification Agent, Color Doppler and Cardiac            Doppler Indications:    Congestive Heart Failure 428.0 / I50.9  History:        Patient has prior history of Echocardiogram examinations, most                 recent 05/05/2020. CAD and Previous Myocardial Infarction, Prior                 CABG, Stroke; Risk Factors:Hypertension and Dyslipidemia.                 Transvenous pacemaker. Hiatal hernia.  Sonographer:    Darlina Sicilian RDCS Referring Phys: [id number] [place] E LIGGETT  Sonographer Comments: Suboptimal apical window and echo performed with patient supine and on artificial respirator. IMPRESSIONS  1. Technically difficult study with poor echo windows. Definity contrast given.  2. Left ventricular ejection fraction, by estimation, is 25 to 30%. The left ventricle has severely decreased function. The left ventricle demonstrates global hypokinesis with regional variation including apical dyskineisis. There is moderate left ventricular hypertrophy. Left ventricular diastolic parameters are indeterminate. Linear filling defect at the apex, could be thrombus or false tendon.  3. Right ventricular systolic function was not well visualized. The right ventricular size is not well visualized.  4. The mitral valve was not well visualized. No evidence of mitral valve regurgitation.  5. The aortic valve is tricuspid. Aortic valve regurgitation is trivial. Mild aortic valve sclerosis is present, with no evidence of aortic valve stenosis.  6. Aortic dilatation noted. There is borderline dilatation of the aortic root, measuring 39 mm. There is mild dilatation of the ascending aorta, measuring 40 mm. Comparison(s): No significant change from prior study. 05/04/2020: LVEF 25-30%, 14 x 6 mm apical filing defect suspicious for LV thrombus. No change in apical filling defect. Suspect false tendon, however, cannot exclude thrombus given cardiomyopathy. FINDINGS  Left Ventricle: Linear filling  defect at the apex, could be thrombus or false tendon. Left ventricular ejection fraction, by estimation, is 25 to 30%. The left ventricle has  severely decreased function. The left ventricle demonstrates global hypokinesis. Mild dyskinesis of the left ventricular, apical apical segment. Definity contrast agent was given IV to delineate the left ventricular endocardial borders. The left ventricular internal cavity size was normal in size. There is moderate left ventricular hypertrophy. Left ventricular diastolic function could not be evaluated due to paced rhythm. Left ventricular diastolic parameters are indeterminate. Right Ventricle: The right ventricular size is not well visualized. Right vetricular wall thickness was not well visualized. Right ventricular systolic function was not well visualized. Left Atrium: Left atrial size was normal in size. Right Atrium: Right atrial size was not well visualized. Pericardium: There is no evidence of pericardial effusion. Mitral Valve: The mitral valve was not well visualized. No evidence of mitral valve regurgitation. Tricuspid Valve: The tricuspid valve is grossly normal. Tricuspid valve regurgitation is mild. Aortic Valve: The aortic valve is tricuspid. Aortic valve regurgitation is trivial. Mild aortic valve sclerosis is present, with no evidence of aortic valve stenosis. Pulmonic Valve: The pulmonic valve was normal in structure. Pulmonic valve regurgitation is not visualized. Aorta: Aortic dilatation noted. There is borderline dilatation of the aortic root, measuring 39 mm. There is mild dilatation of the ascending aorta, measuring 40 mm. Venous: IVC assessment for right atrial pressure unable to be performed due to mechanical ventilation. IAS/Shunts: The interatrial septum was not well visualized. Additional Comments: A pacer wire is visualized.  LEFT VENTRICLE PLAX 2D LVIDd:         5.60 cm LVIDs:         5.10 cm LV PW:         1.30 cm LV IVS:        1.50 cm LVOT  diam:     2.50 cm LV SV:         41 LV SV Index:   20 LVOT Area:     4.91 cm  LV Volumes (MOD) LV vol d, MOD A2C: 174.0 ml LV vol d, MOD A4C: 170.0 ml LV vol s, MOD A2C: 128.0 ml LV vol s, MOD A4C: 131.0 ml LV SV MOD A2C:     46.0 ml LV SV MOD A4C:     170.0 ml LV SV MOD BP:      44.5 ml RIGHT VENTRICLE RV S prime:     9.03 cm/s TAPSE (M-mode): 0.9 cm LEFT ATRIUM             Index LA diam:        3.20 cm 1.57 cm/m LA Vol (A2C):   49.9 ml 24.43 ml/m LA Vol (A4C):   60.9 ml 29.81 ml/m LA Biplane Vol: 59.4 ml 29.08 ml/m  AORTIC VALVE LVOT Vmax:   55.50 cm/s LVOT Vmean:  39.800 cm/s LVOT VTI:    0.083 m  AORTA Ao Root diam: 3.90 cm Ao Asc diam:  4.00 cm TRICUSPID VALVE TR Peak grad:   19.2 mmHg TR Vmax:        219.00 cm/s  SHUNTS Systemic VTI:  0.08 m Systemic Diam: 2.50 cm Lyman Bishop MD Electronically signed by Lyman Bishop MD Signature Date/Time: 06/05/2020/12:42:27 PM    Final      Medications:     Scheduled Medications: . aspirin  81 mg Per Tube Daily  . atorvastatin  80 mg Per Tube q1800  . chlorhexidine gluconate (MEDLINE KIT)  15 mL Mouth Rinse BID  . Chlorhexidine Gluconate Cloth  6 each Topical Daily  . Chlorhexidine Gluconate Cloth  6 each Topical Daily  .  clopidogrel  75 mg Per Tube Daily  . docusate  100 mg Per Tube BID  . escitalopram  10 mg Per Tube Daily  . ezetimibe  10 mg Per Tube Daily  . fentaNYL (SUBLIMAZE) injection  25 mcg Intravenous Once  . mouth rinse  15 mL Mouth Rinse 10 times per day  . pantoprazole (PROTONIX) IV  40 mg Intravenous Daily  . polyethylene glycol  17 g Per Tube Daily  . sodium chloride flush  10-40 mL Intracatheter Q12H  . sodium chloride flush  3 mL Intravenous Q12H    Infusions: . sodium chloride    . sodium chloride Stopped (06/05/20 0020)  . sodium chloride Stopped (06/05/20 0452)  . amiodarone 30 mg/hr (06/05/20 2244)  . fentaNYL infusion INTRAVENOUS 200 mcg/hr (06/06/20 0554)  . heparin 1,500 Units/hr (06/05/20 2259)  . milrinone  0.125 mcg/kg/min (06/06/20 0543)  . norepinephrine (LEVOPHED) Adult infusion 14 mcg/min (06/06/20 0538)  . propofol (DIPRIVAN) infusion 10 mcg/kg/min (06/06/20 0358)    PRN Medications: sodium chloride, sodium chloride, sodium chloride, acetaminophen (TYLENOL) oral liquid 160 mg/5 mL, ALPRAZolam, fentaNYL, nitroGLYCERIN, ondansetron (ZOFRAN) IV, sodium chloride flush, sodium chloride flush    Patient Profile   [name] a 64 y.o.malewith aPMH of CAD (s/p CABG in 2014 with LIMA-LAD, SVG-OM2 and SVG-RCA, s/p angioplasty to proximal SVG-RCA and DES to SVG-RCA anastomosisin 03/2019), chronic combined systolic and diastolic CHF/ICM (EF 11-57% by echo in 06/2019), HTN, HLD,prior CVAand recent STEMIwith angioplasty of the distal bypass graft10/21/21 d/c home with Life Vest.  Presented in VT requiring multiple amio bolus and lido but developed asystole.  Underwent external pacing and atropine then sent to Cath lab.      Assessment/Plan   1. VT arrest -> asystole - emergent cath 06/04/20 with stable coronary anatomy - suspect scar mediated VT - Continue amio 30 gtt - EP managing - will need ICD prior to d/c - Keep K > 4.0 Mg > 2.0  2. Chronic systolic HF with biventricular failure -> cardiogenic shock  - Echo 05/04/20 EF 25%. Moderate RV to severe RV dysfunction and possible LV thrombus. LifeVest placed. D/c'd home on 05/06/20 - RHC on admit with preserved output and well compensated filling pressures.  - Echo remains unchanged from October 2021, EF 25-30%, mod LVH, global hypokineses, aortic root dilatation 3.9 and ascending aorta 4.0 - Today: CVP 12, CI 3.2, coarse RLL, with decrease UOP - Currently on Levo 14 and milrinone 0.125 - Resume GDMT as able.   3. CAD  - s/p CABG  - Inferior STEMI 05/03/20 s/p POBA SVG->PDA - Emergent cath 06/04/20 with stable coronary anatomy - C/w DAPT/statin  4. Possible LV thrombus on echo 05/04/20 - Repeat echo apical filling defect  suspicious for LV thrombus vs false tendon - continue with heparin gttt  5. Acute hypoxic respiratory failure - intubated in setting of cardiac arrest - CCM consulted to help manage vent and sedation (appreciate their help)  6.  Leukocytosis and febrile - WBC admission 6.5 w/ neutro # 1.5; peak yesterday at 21.1; today 16.3; febrile 38 (low grade) - COVID, Flu negative; lactic acid 1.0, PCT 0.22 - CXR shows L atelectasis - Cultures NGTD x 1 day - Remains febrile (uptrend on fever curve) with leukocytosis; will start cefepime and vancomycin, follow cultures, fever, WBCs and d/c as appropriate - Check repeat PCT, lactic - Obtain ABXray d/t distention  7.  AKI -baseline Cr on previous admission around 1.3 -Admission Cr 1.78, today Cr 1.52 -Decreased  UOP, RN tried 250 mL bolus w/o change in UOP -monitor I&Os and BMP   Medication concerns reviewed with patient and pharmacy team. Barriers identified: Compliance with medication and LifeVest  Length of Stay: 2  Carlene Coria, NP  06/06/2020, 6:44 AM  Advanced Heart Failure Team Pager [phone] (M-F; 7a - 4p)  Please contact [facility] for night-coverage after hours (4p -7a ) and weekends on [email or web address]   Agree with above.  Remains intubated on NE/milrinone. VT quiescent on IV amio. No longer V-pacing. Co-ox much improved. Overnight spiked a fever and has some purulent secretions.   Luiz Blare numbers done personally as above.   General:  Awake on vent HEENT: normal + ETT Neck: supple. RIJ swan Carotids 2+ bilat; no bruits. No lymphadenopathy or thryomegaly appreciated. Cor: PMI nondisplaced. Regular rate & rhythm. No rubs, gallops or murmurs. Lungs: coarse Abdomen: soft, nontender, nondistended. No hepatosplenomegaly. No bruits or masses. Good bowel sounds. Extremities: no cyanosis, clubbing, rash, edema Neuro: awake on vent. Following commands  VT quiescent. Continue IV amio. Hemodynamics improved on dual inotropes. Suspect  component of early sepsis. Have added broad spectrum abx. D/w CCM possible extubation later today or tomorrow. Continue heparin for recent LV thrombus.   CRITICAL CARE Performed by: Glori Bickers  Total critical care time: 45 minutes  Critical care time was exclusive of separately billable procedures and treating other patients.  Critical care was necessary to treat or prevent imminent or life-threatening deterioration.  Critical care was time spent personally by me (independent of midlevel providers or residents) on the following activities: development of treatment plan with patient and/or surrogate as well as nursing, discussions with consultants, evaluation of patient's response to treatment, examination of patient, obtaining history from patient or surrogate, ordering and performing treatments and interventions, ordering and review of laboratory studies, ordering and review of radiographic studies, pulse oximetry and re-evaluation of patient's condition.  Glori Bickers, MD  2:43 PM

## 2020-06-06 NOTE — Progress Notes (Addendum)
RT heard [name] alarm going off. [name] read 53% while on BiPAP with great waveform. RN sternum rubbed pt with minimal arousal. [name] went back to 98%. Charge called Dr. Lynetta Mare to inform him. Dr. Tamala Julian came to assess the pt.

## 2020-06-06 NOTE — Procedures (Signed)
Extubation Procedure Note  Patient Details:   Name: [name] DOB: Sep 15, 1955 MRN: [id number]   Airway Documentation:    Vent end date: 06/06/20 Vent end time: 1422   Evaluation  O2 sats: stable throughout Complications: No apparent complications Patient did tolerate procedure well. Bilateral Breath Sounds: Clear, Diminished   Yes,  Prior to extubation, pt did have a positive cuff leak. Pt was extubated to [facility]. Pt tolerated well with no stridor noted. Pt was able to state his name. RT will continue to monitor pt.  Jorje Guild 06/06/2020, 2:24 PM

## 2020-06-06 NOTE — Procedures (Signed)
Intubation Procedure Note  [name]  [id number]  April 16, 1956  Date:06/06/20  Time:6:38 PM   Provider Performing:Tevita Gomer C Tamala Julian    Procedure: Intubation (84210)  Indication(s) Respiratory Failure  Consent Unable to obtain consent due to emergent nature of procedure.   Anesthesia Etomidate and Rocuronium   Time Out Verified patient identification, verified procedure, site/side was marked, verified correct patient position, special equipment/implants available, medications/allergies/relevant history reviewed, required imaging and test results available.   Sterile Technique Usual hand hygeine, masks, and gloves were used   Procedure Description Patient positioned in bed supine.  Sedation given as noted above.  Patient was intubated with endotracheal tube using Glidescope.  View was Grade 1 full glottis  copious mucopurulent secretions coming from vocal cords.  Number of attempts was 1.  Colorimetric CO2 detector was consistent with tracheal placement.   Complications/Tolerance None; patient tolerated the procedure well. Chest X-ray is ordered to verify placement.   EBL Minimal   Specimen(s) None

## 2020-06-06 NOTE — Progress Notes (Signed)
Pt's critical ABG results were given to Dr. Lynetta Mare.

## 2020-06-06 NOTE — Progress Notes (Signed)
ANTICOAGULATION CONSULT NOTE  Pharmacy Consult for heparin Indication: LV Thrombus  No Known Allergies  Patient Measurements: Height: 5\' 7"  (170.2 cm) Weight: 92.9 kg (204 lb 12.9 oz) IBW/kg (Calculated) : 66.1 Heparin Dosing Weight: 93kg  Vital Signs: Temp: 100.6 F (38.1 C) (11/23 0100) BP: 113/72 (11/23 0100) Pulse Rate: 57 (11/23 0100)  Labs: Recent Labs    06/04/20 1148 06/04/20 1541 06/04/20 1803 06/04/20 1803 06/04/20 1805 06/05/20 0334 06/05/20 0841 06/05/20 2325  HGB 17.3*   < > 16.1   < > 16.0 15.0  --   --   HCT 53.7*   < > 47.3  --  47.0 45.4  --   --   PLT 297  --  341  --   --  264  --   --   HEPARINUNFRC  --   --   --   --   --   --  0.17* 0.31  CREATININE 1.78*  --  1.62*  --   --  1.45*  --   --   TROPONINIHS 157*  --   --   --   --   --   --   --    < > = values in this interval not displayed.    Estimated Creatinine Clearance: 54.4 mL/min (A) (by C-G formula based on SCr of 1.45 mg/dL (H)).  Assessment: 64 y.o. male with LV thrombus for heparin   Goal of Therapy:  Heparin level 0.3-0.7 units/ml Monitor platelets by anticoagulation protocol: Yes   Plan:  Continue Heparin at current rate  Phillis Knack, PharmD, BCPS  06/06/2020 1:08 AM

## 2020-06-06 NOTE — Progress Notes (Signed)
ANTICOAGULATION CONSULT NOTE  Pharmacy Consult for heparin Indication: LV Thrombus  No Known Allergies  Patient Measurements: Height: 5\' 7"  (170.2 cm) Weight: 93.5 kg (206 lb 2.1 oz) IBW/kg (Calculated) : 66.1 Heparin Dosing Weight: 85.9kg  Vital Signs: Temp: 100.6 F (38.1 C) (11/23 0700) BP: 117/71 (11/23 0700) Pulse Rate: 56 (11/23 0700)  Labs: Recent Labs    06/04/20 1148 06/04/20 1541 06/04/20 1803 06/04/20 1803 06/04/20 1805 06/04/20 1805 06/05/20 0334 06/05/20 0841 06/05/20 2325 06/06/20 0450  HGB 17.3*   < > 16.1   < > 16.0   < > 15.0  --   --  14.1  HCT 53.7*   < > 47.3   < > 47.0  --  45.4  --   --  43.8  PLT 297   < > 341  --   --   --  264  --   --  182  HEPARINUNFRC  --   --   --   --   --   --   --  0.17* 0.31 0.36  CREATININE 1.78*   < > 1.62*  --   --   --  1.45*  --   --  1.52*  TROPONINIHS 157*  --   --   --   --   --   --   --   --   --    < > = values in this interval not displayed.    Estimated Creatinine Clearance: 52.1 mL/min (A) (by C-G formula based on SCr of 1.52 mg/dL (H)).  Assessment: 58 yoM admitted with VT now s/p LHC with stable coronary disease. Pharmacy asked to start IV heparin 8h after sheath removal (1800) with concerns for LV thrombus on prior ECHO in 04/2020. ECHO on 11/22 revealed a filling defect that could be a thrombus or false tendon.   Heparin level therapeutic this AM at 0.36 on 1500 units/hr. CBC stable.   Goal of Therapy:  Heparin level 0.3-0.7 units/ml Monitor platelets by anticoagulation protocol: Yes   Plan:  Continue heparin at increased rate 1500 units/hr Daily HL and CBC Monitor  s/sx bleeding Follow up heparin plan with MD today  Cephus Slater, PharmD, [facility] Resident [phone] 06/06/2020 7:25 AM

## 2020-06-06 NOTE — Progress Notes (Signed)
Pharmacy Antibiotic Note  Abhijot Straughter is a 64 y.o. male admitted on 06/04/2020 with dizziness/VT.  Pharmacy has been consulted for Vancomycin/Cefepime dosing. WBC has been elevated for a couple of days. Febrile up to 101.5. Starting broad spectrum anti-biotics. Noted renal dysfunction.   Plan: Vancomycin 750 mg IV q12h Cefepime 2g IV q12h Trend WBC, temp, renal function  F/U infectious work-up Drug levels as indicated   Height: 5\' 7"  (170.2 cm) Weight: 93.5 kg (206 lb 2.1 oz) IBW/kg (Calculated) : 66.1  Temp (24hrs), Avg:100.6 F (38.1 C), Min:99.7 F (37.6 C), Max:101.5 F (38.6 C)  Recent Labs  Lab 06/04/20 1148 06/04/20 1803 06/05/20 0334 06/05/20 0839 06/06/20 0450  WBC 6.5 21.1* 16.1*  --  16.3*  CREATININE 1.78* 1.62* 1.45*  --  1.52*  LATICACIDVEN  --   --   --  1.0  --     Estimated Creatinine Clearance: 52.1 mL/min (A) (by C-G formula based on SCr of 1.52 mg/dL (H)).    No Known Allergies  Narda Bonds, PharmD, BCPS Clinical Pharmacist Phone: [phone]

## 2020-06-06 NOTE — Progress Notes (Signed)
Progress Note  Patient Name: [name] Date of Encounter: 06/06/2020  [facility] Cardiologist: Sanda Klein, MD   Subjective   Intubated/sedated, though wakes  Inpatient Medications    Scheduled Meds: . aspirin  81 mg Per Tube Daily  . atorvastatin  80 mg Per Tube q1800  . chlorhexidine gluconate (MEDLINE KIT)  15 mL Mouth Rinse BID  . Chlorhexidine Gluconate Cloth  6 each Topical Daily  . Chlorhexidine Gluconate Cloth  6 each Topical Daily  . clopidogrel  75 mg Per Tube Daily  . docusate  100 mg Per Tube BID  . escitalopram  10 mg Per Tube Daily  . ezetimibe  10 mg Per Tube Daily  . fentaNYL (SUBLIMAZE) injection  25 mcg Intravenous Once  . mouth rinse  15 mL Mouth Rinse 10 times per day  . pantoprazole (PROTONIX) IV  40 mg Intravenous Daily  . polyethylene glycol  17 g Per Tube Daily  . sodium chloride flush  10-40 mL Intracatheter Q12H  . sodium chloride flush  3 mL Intravenous Q12H   Continuous Infusions: . sodium chloride    . sodium chloride Stopped (06/05/20 0020)  . sodium chloride Stopped (06/05/20 0452)  . amiodarone 30 mg/hr (06/05/20 2244)  . ceFEPime (MAXIPIME) IV    . fentaNYL infusion INTRAVENOUS 200 mcg/hr (06/06/20 0554)  . heparin 1,500 Units/hr (06/05/20 2259)  . milrinone 0.125 mcg/kg/min (06/06/20 0543)  . norepinephrine (LEVOPHED) Adult infusion 14 mcg/min (06/06/20 0538)  . propofol (DIPRIVAN) infusion 10 mcg/kg/min (06/06/20 0358)  . vancomycin     PRN Meds: sodium chloride, sodium chloride, sodium chloride, acetaminophen (TYLENOL) oral liquid 160 mg/5 mL, ALPRAZolam, fentaNYL, nitroGLYCERIN, ondansetron (ZOFRAN) IV, sodium chloride flush, sodium chloride flush   Vital Signs    Vitals:   06/06/20 0615 06/06/20 0630 06/06/20 0645 06/06/20 0700  BP:    117/71  Pulse:    (!) 56  Resp:    (!) 22  Temp:    (!) 100.6 F (38.1 C)  TempSrc:      SpO2: 99% 99% 98% 98%  Weight:      Height:        Intake/Output Summary (Last 24  hours) at 06/06/2020 0733 Last data filed at 06/05/2020 2339 Gross per 24 hour  Intake 1097.19 ml  Output 214 ml  Net 883.19 ml   Last 3 Weights 06/06/2020 06/05/2020 06/04/2020  Weight (lbs) 206 lb 2.1 oz 204 lb 12.9 oz 205 lb 14.6 oz  Weight (kg) 93.5 kg 92.9 kg 93.4 kg      Telemetry    V paced - Personally Reviewed  ECG    No new EKGs - Personally Reviewed  Physical Exam   GEN: intubated/sedated, wakes to verbal and tounch Neck: No JVD Cardiac: RRR, no murmurs, rubs, or gallops, feet are cool b/l though better then yesterdy Respiratory: intubated, scattered rhonchi, GI: Soft, nontender, slightly disxtended  MS: trace edema; No deformity. Neuro:  unable to assess  Psych: unable to assess   Labs    High Sensitivity Troponin:   Recent Labs  Lab 06/04/20 1148  TROPONINIHS 157*      Chemistry Recent Labs  Lab 06/04/20 1148 06/04/20 1541 06/04/20 1746 06/04/20 1803 06/04/20 1805 06/05/20 0334 06/06/20 0450  NA 139   < >   < >  --  140 139 137  K 4.2   < >   < >  --  4.3 3.9 4.1  CL 104  --   --   --   --  109 105  CO2 22  --   --   --   --  19* 19*  GLUCOSE 143*  --   --   --   --  124* 119*  BUN 20  --   --   --   --  20 16  CREATININE 1.78*   < >  --  1.62*  --  1.45* 1.52*  CALCIUM 9.0  --   --   --   --  7.9* 7.9*  PROT 7.3  --   --   --   --   --   --   ALBUMIN 3.9  --   --   --   --   --   --   AST 26  --   --   --   --   --   --   ALT 22  --   --   --   --   --   --   ALKPHOS 66  --   --   --   --   --   --   BILITOT 1.1  --   --   --   --   --   --   GFRNONAA 42*   < >  --  47*  --  53* 50*  ANIONGAP 13  --   --   --   --  11 13   < > = values in this interval not displayed.     Hematology Recent Labs  Lab 06/04/20 1803 06/04/20 1803 06/04/20 1805 06/05/20 0334 06/06/20 0450  WBC 21.1*  --   --  16.1* 16.3*  RBC 5.10  --   --  4.87 4.59  HGB 16.1   < > 16.0 15.0 14.1  HCT 47.3   < > 47.0 45.4 43.8  MCV 92.7  --   --  93.2 95.4   MCH 31.6  --   --  30.8 30.7  MCHC 34.0  --   --  33.0 32.2  RDW 12.6  --   --  12.9 12.8  PLT 341  --   --  264 182   < > = values in this interval not displayed.    BNPNo results for input(s): BNP, PROBNP in the last 168 hours.   DDimer No results for input(s): DDIMER in the last 168 hours.   Radiology     DG Chest Port 1 View Result Date: 06/04/2020 CLINICAL DATA:  Intubation EXAM: PORTABLE CHEST 1 VIEW COMPARISON:  Same day chest radiograph FINDINGS: Interval placement of endotracheal tube with distal tip terminating approximately 3.1 cm above the carina. Interval placement of enteric tube which courses below the diaphragm with distal tip beyond the inferior margin of the film. Prior median sternotomy. Multiple overlying cardiac leads and pacer pad. Stable cardiomegaly. Left basilar atelectasis. No evidence of pneumothorax. IMPRESSION: 1. Interval placement of endotracheal and enteric tubes, as described. 2. Left basilar atelectasis. Electronically Signed   By: Davina Poke D.O.   On: 06/04/2020 14:44   DG Chest Port 1 View Result Date: 06/04/2020 CLINICAL DATA:  64 year old male with chest pain, ventricular tachycardia. EXAM: PORTABLE CHEST 1 VIEW COMPARISON:  Portable chest 03/23/2019 and earlier. FINDINGS: Portable AP semi upright view at 1140 hours. Pacer or resuscitation pads project over the chest. Stable mild cardiomegaly. Prior sternotomy. Other mediastinal contours are within normal limits. Visualized tracheal air column is within normal limits. Allowing for portable technique the lungs  are clear. No pneumothorax or pleural effusion. No acute osseous abnormality identified. IMPRESSION: Stable mild cardiomegaly.  No acute cardiopulmonary abnormality. Electronically Signed   By: Genevie Ann M.D.   On: 06/04/2020 12:26    Cardiac Studies   06/05/2020: TTE IMPRESSIONS  1. Technically difficult study with poor echo windows. Definity contrast  given.  2. Left ventricular  ejection fraction, by estimation, is 25 to 30%. The  left ventricle has severely decreased function. The left ventricle  demonstrates global hypokinesis with regional variation including apical  dyskineisis. There is moderate left  ventricular hypertrophy. Left ventricular diastolic parameters are  indeterminate. Linear filling defect at the apex, could be thrombus or  false tendon.  3. Right ventricular systolic function was not well visualized. The right  ventricular size is not well visualized.  4. The mitral valve was not well visualized. No evidence of mitral valve  regurgitation.  5. The aortic valve is tricuspid. Aortic valve regurgitation is trivial.  Mild aortic valve sclerosis is present, with no evidence of aortic valve  stenosis.  6. Aortic dilatation noted. There is borderline dilatation of the aortic  root, measuring 39 mm. There is mild dilatation of the ascending aorta,  measuring 40 mm.   Comparison(s): No significant change from prior study. 05/04/2020: LVEF  25-30%, 14 x 6 mm apical filing defect suspicious for LV thrombus. No  change in apical filling defect. Suspect false tendon, however, cannot  exclude thrombus given cardiomyopathy.    06/04/2020: R/LHC  Mid LAD lesion is 100% stenosed.  Prox Cx lesion is 100% stenosed.  Prox RCA lesion is 100% stenosed.  Dist RCA lesion is 20% stenosed.  Non-stenotic Origin to Prox Graft lesion was previously treated.  Prox Graft lesion is 30% stenosed.  LV end diastolic pressure is mildly elevated.   1. Severe 3 vessel occlusive CAD. 2. Patent LIMA to the LAD 3. Patent SVG to OM1 4. Patent SVG to RCA. Much improved flow from prior PCI. 5. Mildly elevated LVEDP 17 mm Hg 6. Successful placement of temporary transvenous pacemaker.  Plan: Dr Haroldine Laws placed a [name] catheter via the right internal jugular Vein. Will leave temporary transvenous pacemaker in place so that antiarrhythmic drugs can be used  liberally.  Right heart cath demonstrates good cardiac output and right heart pressures so we deferred placing an IABP. Will need right femoral arterial sheath removed manually.  RHC Findings:  Ao = 174/111 (138)  LV = 169/16 RA =  7 RV = 41/6 PA = 37/20 (28) PCW = 14 Fick cardiac output/index = 11.6/5.7 PVR = 1.2 WU FA sat = 99% PA sat = 85%, 86% SVC sat = 89%  High cardiac output with normal filling pressures. No evidence of intracardiac shunting.     05/04/2020: TTE IMPRESSIONS  1. Since the last study on 06/23/2019 LVEF has decreased from 35-40% to  25-30%, there is a suspicion for left ventricular apical thrombus  measuring 14 x 6 mm.  2. Left ventricular ejection fraction, by estimation, is 25 to 30%. The  left ventricle has severely decreased function. The left ventricle  demonstrates global hypokinesis. The left ventricular internal cavity size  was moderately dilated. Left  ventricular diastolic parameters are consistent with Grade I diastolic  dysfunction (impaired relaxation). Elevated left atrial pressure.  3. Right ventricular systolic function is moderately reduced. The right  ventricular size is moderately enlarged. There is mildly elevated  pulmonary artery systolic pressure. The estimated right ventricular  systolic pressure is  30.8 mmHg.  4. Left atrial size was moderately dilated.  5. Right atrial size was moderately dilated.  6. The mitral valve is normal in structure. Mild mitral valve  regurgitation. No evidence of mitral stenosis.  7. The aortic valve is normal in structure. Aortic valve regurgitation is  mild. No aortic stenosis is present.  8. The inferior vena cava is normal in size with greater than 50%  respiratory variability, suggesting right atrial pressure of 3 mmHg.    Patient Profile     64 y.o. male with aPMH of CAD (s/p CABG in 2014 with LIMA-LAD, SVG-OM2 and SVG-RCA, s/p angioplasty to proximal SVG-RCA and DES to SVG-RCA  anastomosisin 03/2019), chronic combined systolic and diastolic CHF/ICM (EF 48-01% by echo in 06/2019), HTN, HLD,prior CVAand recent STEMIwith angioplasty of the distal bypass graft10/21/21  [place] treated with amio bolus' repeat bolus in ER >> asystole >> external pacing>> emergent cath/temp wire placement, intubated sedated.  Assessment & Plan   Dr. Quentin Ore has seen the patient this AM discussed case with AHF APP  . VT arrest -> asystole - emergent cath 06/04/20 with stable coronary anatomy - suspect scar mediated VT - Continue amio gtt    Had some lido in the ER only - will need ICD (+/- VT ablation) prior to d/c, pending today/tomorrow, perhaps implant Wed - Keep K > 4.0 Mg > 2.0  No RV pacing since yesterday afternoon, HRs 50's-60's   2. Chronic systolic HF with biventricular failure -> cardiogenic shock  - Echo 05/04/20 EF 25%. Moderate RV to severe RV dysfunction and possible LV thrombus. LifeVest placed. D/c'd home on 05/06/20 - RHC on admit with preserved output and well compensated filling pressures.  - Resume GDMT when able, off metoprolol for now given levo/hypotension  No/very little urine OP last night BUN/Creat 16/1.52 Numbers improving  3. CAD  - s/p CABG  - Inferior STEMI 05/03/20 s/p POBA SVG->PDA - Emergent cath 06/04/20 with stable coronary anatomy - on DAPT/statin/zetia  4. Possible LV thrombus on echo 05/04/20 - on heparin gtt  H/H, plts stable  5. Acute hypoxic respiratory failure - intubated in setting of cardiac arrest - CCM assist appreciated Wean pressor as able/per CCM Wean vent as able/per CCM thick secretions in ET tube  6. fever     WBC remains 16     Broad abx being started today  7. AKI/CKD     Creat stable though not making much urine      D/w HF APP at bedside     Will defer to their service any volume recommendations    For questions or updates, please contact [place] Please consult [email or web address] for contact info under        Signed, Baldwin Jamaica, PA-C  06/06/2020, 7:33 AM

## 2020-06-07 ENCOUNTER — Inpatient Hospital Stay (HOSPITAL_COMMUNITY): Payer: Medicare HMO

## 2020-06-07 ENCOUNTER — Other Ambulatory Visit: Payer: Self-pay | Admitting: Cardiology

## 2020-06-07 DIAGNOSIS — R570 Cardiogenic shock: Secondary | ICD-10-CM | POA: Diagnosis not present

## 2020-06-07 DIAGNOSIS — J9601 Acute respiratory failure with hypoxia: Secondary | ICD-10-CM | POA: Diagnosis not present

## 2020-06-07 DIAGNOSIS — I472 Ventricular tachycardia: Secondary | ICD-10-CM | POA: Diagnosis not present

## 2020-06-07 LAB — CBC
HCT: 39.8 % (ref 39.0–52.0)
Hemoglobin: 12.6 g/dL — ABNORMAL LOW (ref 13.0–17.0)
MCH: 30.8 pg (ref 26.0–34.0)
MCHC: 31.7 g/dL (ref 30.0–36.0)
MCV: 97.3 fL (ref 80.0–100.0)
Platelets: 124 10*3/uL — ABNORMAL LOW (ref 150–400)
RBC: 4.09 MIL/uL — ABNORMAL LOW (ref 4.22–5.81)
RDW: 13 % (ref 11.5–15.5)
WBC: 14.2 10*3/uL — ABNORMAL HIGH (ref 4.0–10.5)
nRBC: 0 % (ref 0.0–0.2)

## 2020-06-07 LAB — BASIC METABOLIC PANEL
Anion gap: 11 (ref 5–15)
BUN: 18 mg/dL (ref 8–23)
CO2: 19 mmol/L — ABNORMAL LOW (ref 22–32)
Calcium: 8 mg/dL — ABNORMAL LOW (ref 8.9–10.3)
Chloride: 107 mmol/L (ref 98–111)
Creatinine, Ser: 1.52 mg/dL — ABNORMAL HIGH (ref 0.61–1.24)
GFR, Estimated: 50 mL/min — ABNORMAL LOW (ref >60)
Glucose, Bld: 109 mg/dL — ABNORMAL HIGH (ref 70–99)
Potassium: 4 mmol/L (ref 3.5–5.1)
Sodium: 137 mmol/L (ref 135–145)

## 2020-06-07 LAB — PROCALCITONIN: Procalcitonin: 0.31 ng/mL

## 2020-06-07 LAB — COOXEMETRY PANEL
Carboxyhemoglobin: 0.9 % (ref 0.5–1.5)
Methemoglobin: 0.8 % (ref 0.0–1.5)
O2 Saturation: 71.5 %
Total hemoglobin: 13 g/dL (ref 12.0–16.0)

## 2020-06-07 LAB — GLUCOSE, CAPILLARY
Glucose-Capillary: 91 mg/dL (ref 70–99)
Glucose-Capillary: 98 mg/dL (ref 70–99)
Glucose-Capillary: 117 mg/dL — ABNORMAL HIGH (ref 70–99)
Glucose-Capillary: 114 mg/dL — ABNORMAL HIGH (ref 70–99)
Glucose-Capillary: 103 mg/dL — ABNORMAL HIGH (ref 70–99)

## 2020-06-07 LAB — MAGNESIUM: Magnesium: 1.9 mg/dL (ref 1.7–2.4)

## 2020-06-07 LAB — HEPARIN LEVEL (UNFRACTIONATED): Heparin Unfractionated: 0.22 IU/mL — ABNORMAL LOW (ref 0.30–0.70)

## 2020-06-07 MED ORDER — MAGNESIUM SULFATE IN D5W 1-5 GM/100ML-% IV SOLN
1.0000 g | Freq: Once | INTRAVENOUS | Status: AC
  Administered 2020-06-07: 1 g via INTRAVENOUS
  Filled 2020-06-07: qty 100

## 2020-06-07 MED ORDER — ENOXAPARIN SODIUM 40 MG/0.4ML ~~LOC~~ SOLN
40.0000 mg | SUBCUTANEOUS | Status: DC
  Administered 2020-06-08 – 2020-06-13 (×6): 40 mg via SUBCUTANEOUS
  Filled 2020-06-07 (×6): qty 0.4

## 2020-06-07 MED ORDER — FUROSEMIDE 10 MG/ML IJ SOLN
60.0000 mg | Freq: Once | INTRAMUSCULAR | Status: AC
  Administered 2020-06-07: 60 mg via INTRAVENOUS
  Filled 2020-06-07: qty 6

## 2020-06-07 MED ORDER — MIDAZOLAM HCL 2 MG/2ML IJ SOLN
INTRAMUSCULAR | Status: AC
  Administered 2020-06-07: 2 mg
  Filled 2020-06-07: qty 2

## 2020-06-07 MED ORDER — POTASSIUM CHLORIDE 20 MEQ PO PACK
20.0000 meq | PACK | Freq: Once | ORAL | Status: AC
  Administered 2020-06-07: 20 meq via ORAL
  Filled 2020-06-07: qty 1

## 2020-06-07 NOTE — Progress Notes (Addendum)
Progress Note  Patient Name: [name] Date of Encounter: 06/07/2020  [facility] Cardiologist: Sanda Klein, MD   Subjective   Intubated/sedated, moves feet with touch  Inpatient Medications    Scheduled Meds: . aspirin  81 mg Per Tube Daily  . atorvastatin  80 mg Per Tube q1800  . chlorhexidine gluconate (MEDLINE KIT)  15 mL Mouth Rinse BID  . Chlorhexidine Gluconate Cloth  6 each Topical Daily  . Chlorhexidine Gluconate Cloth  6 each Topical Daily  . clopidogrel  75 mg Per Tube Daily  . docusate  100 mg Per Tube BID  . escitalopram  10 mg Per Tube Daily  . ezetimibe  10 mg Per Tube Daily  . fentaNYL (SUBLIMAZE) injection  25 mcg Intravenous Once  . mouth rinse  15 mL Mouth Rinse 10 times per day  . pantoprazole (PROTONIX) IV  40 mg Intravenous Daily  . polyethylene glycol  17 g Per Tube Daily  . sodium chloride flush  10-40 mL Intracatheter Q12H  . sodium chloride flush  3 mL Intravenous Q12H   Continuous Infusions: . sodium chloride 10 mL/hr at 06/07/20 0628  . sodium chloride 10 mL/hr at 06/07/20 1884  . amiodarone 30 mg/hr (06/07/20 1660)  . ceFEPime (MAXIPIME) IV Stopped (06/06/20 2312)  . fentaNYL infusion INTRAVENOUS 150 mcg/hr (06/07/20 0628)  . heparin 1,500 Units/hr (06/07/20 6301)  . milrinone 0.125 mcg/kg/min (06/07/20 6010)  . norepinephrine (LEVOPHED) Adult infusion 2 mcg/min (06/07/20 9323)  . propofol (DIPRIVAN) infusion 15 mcg/kg/min (06/07/20 0628)  . vancomycin Stopped (06/06/20 2116)   PRN Meds: sodium chloride, sodium chloride, acetaminophen (TYLENOL) oral liquid 160 mg/5 mL, ALPRAZolam, fentaNYL, nitroGLYCERIN, ondansetron (ZOFRAN) IV, sodium chloride flush, sodium chloride flush   Vital Signs    Vitals:   06/07/20 0615 06/07/20 0630 06/07/20 0645 06/07/20 0700  BP: 113/67 105/67 103/65 107/65  Pulse: 60 (!) 59 (!) 59 60  Resp: (!) 22 (!) 22 (!) 22 (!) 22  Temp: (!) 100.8 F (38.2 C) (!) 100.8 F (38.2 C) (!) 100.8 F (38.2  C) (!) 100.8 F (38.2 C)  TempSrc:      SpO2: 100% 100% 100% 100%  Weight:      Height:        Intake/Output Summary (Last 24 hours) at 06/07/2020 0739 Last data filed at 06/07/2020 5573 Gross per 24 hour  Intake 2089.78 ml  Output 1575 ml  Net 514.78 ml   Last 3 Weights 06/07/2020 06/06/2020 06/05/2020  Weight (lbs) 203 lb 4.2 oz 206 lb 2.1 oz 204 lb 12.9 oz  Weight (kg) 92.2 kg 93.5 kg 92.9 kg      Telemetry    SR, generally 50's-60's - Personally Reviewed  ECG    No new EKGs - Personally Reviewed  Physical Exam   GEN: intubated/sedated, moving feet to touch Neck: No JVD Cardiac: RRR, no murmurs, rubs, or gallops, feet/extrem much warmer then yesterday Respiratory: intubated, scattered rhonchi, GI: Soft, nontender, slightly distended  MS: trace edema; No deformity. Neuro:  unable to assess  Psych: unable to assess   Labs    High Sensitivity Troponin:   Recent Labs  Lab 06/04/20 1148  TROPONINIHS 157*      Chemistry Recent Labs  Lab 06/04/20 1148 06/04/20 1541 06/05/20 0334 06/05/20 0334 06/06/20 0450 06/06/20 0450 06/06/20 1800 06/06/20 2036 06/07/20 0408  NA 139   < > 139   < > 137   < > 144 139 137  K 4.2   < >  3.9   < > 4.1   < > 4.0 5.4* 4.0  CL 104   < > 109  --  105  --   --   --  107  CO2 22   < > 19*  --  19*  --   --   --  19*  GLUCOSE 143*   < > 124*  --  119*  --   --   --  109*  BUN 20   < > 20  --  16  --   --   --  18  CREATININE 1.78*   < > 1.45*  --  1.52*  --   --   --  1.52*  CALCIUM 9.0   < > 7.9*  --  7.9*  --   --   --  8.0*  PROT 7.3  --   --   --   --   --   --   --   --   ALBUMIN 3.9  --   --   --   --   --   --   --   --   AST 26  --   --   --   --   --   --   --   --   ALT 22  --   --   --   --   --   --   --   --   ALKPHOS 66  --   --   --   --   --   --   --   --   BILITOT 1.1  --   --   --   --   --   --   --   --   GFRNONAA 42*   < > 53*  --  50*  --   --   --  50*  ANIONGAP 13   < > 11  --  13  --   --   --   11   < > = values in this interval not displayed.     Hematology Recent Labs  Lab 06/05/20 0334 06/05/20 0334 06/06/20 0450 06/06/20 0450 06/06/20 1800 06/06/20 2036 06/07/20 0408  WBC 16.1*  --  16.3*  --   --   --  14.2*  RBC 4.87  --  4.59  --   --   --  4.09*  HGB 15.0   < > 14.1   < > 13.3 13.9 12.6*  HCT 45.4   < > 43.8   < > 39.0 41.0 39.8  MCV 93.2  --  95.4  --   --   --  97.3  MCH 30.8  --  30.7  --   --   --  30.8  MCHC 33.0  --  32.2  --   --   --  31.7  RDW 12.9  --  12.8  --   --   --  13.0  PLT 264  --  182  --   --   --  124*   < > = values in this interval not displayed.    BNPNo results for input(s): BNP, PROBNP in the last 168 hours.   DDimer No results for input(s): DDIMER in the last 168 hours.   Radiology     DG Chest Port 1 View Result Date: 06/04/2020 CLINICAL DATA:  Intubation EXAM: PORTABLE CHEST 1 VIEW COMPARISON:  Same  day chest radiograph FINDINGS: Interval placement of endotracheal tube with distal tip terminating approximately 3.1 cm above the carina. Interval placement of enteric tube which courses below the diaphragm with distal tip beyond the inferior margin of the film. Prior median sternotomy. Multiple overlying cardiac leads and pacer pad. Stable cardiomegaly. Left basilar atelectasis. No evidence of pneumothorax. IMPRESSION: 1. Interval placement of endotracheal and enteric tubes, as described. 2. Left basilar atelectasis. Electronically Signed   By: Davina Poke D.O.   On: 06/04/2020 14:44   DG Chest Port 1 View Result Date: 06/04/2020 CLINICAL DATA:  64 year old male with chest pain, ventricular tachycardia. EXAM: PORTABLE CHEST 1 VIEW COMPARISON:  Portable chest 03/23/2019 and earlier. FINDINGS: Portable AP semi upright view at 1140 hours. Pacer or resuscitation pads project over the chest. Stable mild cardiomegaly. Prior sternotomy. Other mediastinal contours are within normal limits. Visualized tracheal air column is within normal  limits. Allowing for portable technique the lungs are clear. No pneumothorax or pleural effusion. No acute osseous abnormality identified. IMPRESSION: Stable mild cardiomegaly.  No acute cardiopulmonary abnormality. Electronically Signed   By: Genevie Ann M.D.   On: 06/04/2020 12:26    Cardiac Studies   06/05/2020: TTE IMPRESSIONS  1. Technically difficult study with poor echo windows. Definity contrast  given.  2. Left ventricular ejection fraction, by estimation, is 25 to 30%. The  left ventricle has severely decreased function. The left ventricle  demonstrates global hypokinesis with regional variation including apical  dyskineisis. There is moderate left  ventricular hypertrophy. Left ventricular diastolic parameters are  indeterminate. Linear filling defect at the apex, could be thrombus or  false tendon.  3. Right ventricular systolic function was not well visualized. The right  ventricular size is not well visualized.  4. The mitral valve was not well visualized. No evidence of mitral valve  regurgitation.  5. The aortic valve is tricuspid. Aortic valve regurgitation is trivial.  Mild aortic valve sclerosis is present, with no evidence of aortic valve  stenosis.  6. Aortic dilatation noted. There is borderline dilatation of the aortic  root, measuring 39 mm. There is mild dilatation of the ascending aorta,  measuring 40 mm.   Comparison(s): No significant change from prior study. 05/04/2020: LVEF  25-30%, 14 x 6 mm apical filing defect suspicious for LV thrombus. No  change in apical filling defect. Suspect false tendon, however, cannot  exclude thrombus given cardiomyopathy.    06/04/2020: R/LHC  Mid LAD lesion is 100% stenosed.  Prox Cx lesion is 100% stenosed.  Prox RCA lesion is 100% stenosed.  Dist RCA lesion is 20% stenosed.  Non-stenotic Origin to Prox Graft lesion was previously treated.  Prox Graft lesion is 30% stenosed.  LV end diastolic pressure is  mildly elevated.   1. Severe 3 vessel occlusive CAD. 2. Patent LIMA to the LAD 3. Patent SVG to OM1 4. Patent SVG to RCA. Much improved flow from prior PCI. 5. Mildly elevated LVEDP 17 mm Hg 6. Successful placement of temporary transvenous pacemaker.  Plan: Dr Haroldine Laws placed a [name] catheter via the right internal jugular Vein. Will leave temporary transvenous pacemaker in place so that antiarrhythmic drugs can be used liberally.  Right heart cath demonstrates good cardiac output and right heart pressures so we deferred placing an IABP. Will need right femoral arterial sheath removed manually.  RHC Findings:  Ao = 174/111 (138)  LV = 169/16 RA =  7 RV = 41/6 PA = 37/20 (28) PCW = 14 Fick  cardiac output/index = 11.6/5.7 PVR = 1.2 WU FA sat = 99% PA sat = 85%, 86% SVC sat = 89%  High cardiac output with normal filling pressures. No evidence of intracardiac shunting.     05/04/2020: TTE IMPRESSIONS  1. Since the last study on 06/23/2019 LVEF has decreased from 35-40% to  25-30%, there is a suspicion for left ventricular apical thrombus  measuring 14 x 6 mm.  2. Left ventricular ejection fraction, by estimation, is 25 to 30%. The  left ventricle has severely decreased function. The left ventricle  demonstrates global hypokinesis. The left ventricular internal cavity size  was moderately dilated. Left  ventricular diastolic parameters are consistent with Grade I diastolic  dysfunction (impaired relaxation). Elevated left atrial pressure.  3. Right ventricular systolic function is moderately reduced. The right  ventricular size is moderately enlarged. There is mildly elevated  pulmonary artery systolic pressure. The estimated right ventricular  systolic pressure is 47.6 mmHg.  4. Left atrial size was moderately dilated.  5. Right atrial size was moderately dilated.  6. The mitral valve is normal in structure. Mild mitral valve  regurgitation. No evidence of  mitral stenosis.  7. The aortic valve is normal in structure. Aortic valve regurgitation is  mild. No aortic stenosis is present.  8. The inferior vena cava is normal in size with greater than 50%  respiratory variability, suggesting right atrial pressure of 3 mmHg.    Patient Profile     64 y.o. male with aPMH of CAD (s/p CABG in 2014 with LIMA-LAD, SVG-OM2 and SVG-RCA, s/p angioplasty to proximal SVG-RCA and DES to SVG-RCA anastomosisin 03/2019), chronic combined systolic and diastolic CHF/ICM (EF 54-65% by echo in 06/2019), HTN, HLD,prior CVAand recent STEMIwith angioplasty of the distal bypass graft10/21/21  [place]/VT treated with amio bolus' repeat bolus in ER >> asystole >> external pacing>> emergent cath/temp wire placement, intubated sedated.  Assessment & Plan   Dr. Quentin Ore has seen the patient this AM discussed case with AHF APP  . VT arrest -> asystole - emergent cath 06/04/20 with stable coronary anatomy - suspect scar mediated VT - Continue amio gtt    Had some lido in the ER only - will need ICD (+/- VT ablation) prior to d/c, pending today/tomorrow, perhaps implant Wed - Keep K > 4.0 Mg > 2.0  Rare V paced beats (set at 40bpm), SB/SR with intact AV conduction 50's-70's Reluctant to remove temp wire just yet given asystole in the ER Follow his clinical progress today and might consider tomorrow if no significant pacing again in these next 24 hours   2. Chronic systolic HF with biventricular failure -> cardiogenic shock  - Echo 05/04/20 EF 25%. Moderate RV to severe RV dysfunction and possible LV thrombus. LifeVest placed. D/c'd home on 05/06/20 - RHC on admit with preserved output and well compensated filling pressures.  - Resume GDMT when able, off metoprolol for now given levo/hypotension  Turns out urinary catheter was occluded and once resolved urine output yesterday looked OK BUN/Creat 18/1.52 Coox  72 Remains on milrinone  Urine OP yesterday -1665, though remains cumulatively positive  Only on 2 norepi   3. CAD  - s/p CABG  - Inferior STEMI 05/03/20 s/p POBA SVG->PDA - Emergent cath 06/04/20 with stable coronary anatomy - on DAPT/statin/zetia  4. Possible LV thrombus on echo 05/04/20 - on heparin gtt  H/H, plts down some today to 124 (started about 300)  Dr. Quentin Ore has reviewed echo images, not convinced LV thrombus Will d/w Dr. Haroldine Laws, ? TEE   5. Acute hypoxic respiratory failure - intubated in setting of cardiac arrest - CCM assist appreciated  Extubated yesterday though with progressive lethargy and worsening resp status re-intubated Sedated   6. fever     WBC >> 14     Broad abx being started yesterday     Likely developing pneumonia     BC from 11/22 both neg so far     CXR today is pending read, yesterday with developing effusions  7. AKI/CKD     Renal function stable       For questions or updates, please contact [place] Please consult [email or web address] for contact info under        Signed, Baldwin Jamaica, PA-C  06/07/2020, 7:39 AM

## 2020-06-07 NOTE — Progress Notes (Addendum)
Advanced Heart Failure Rounding Note  PCP-Cardiologist: Sanda Klein, MD   Subjective:    Admitted for VT arrest in setting of severe ICM; taken to cath found to have stable anatomy and preserved output.    Yesterday: Febrile Tmax 101.7 started broad spectrum abx; co-ox 72%, CVP 12, CI 3.2, SVR 1090. Able to titrate off sedation and decrease NE; extubated 14:22 then re-intubated at 18:38.    Today: Intubated, febrile Tmax 101.1; NE 2, milrinone 0.125.  Co-ox 72%, CVP 12, CI 2.9, SVR 786.   Swan #s RA 6 PA 41/18 (24) Thermo 5.9/2.9 SVR 786   Objective:   Weight Range: 92.2 kg Body mass index is 31.84 kg/m.   Vital Signs:   Temp:  [98.2 F (36.8 C)-101.7 F (38.7 C)] 100.9 F (38.3 C) (11/24 0500) Pulse Rate:  [47-94] 61 (11/24 0500) Resp:  [8-28] 22 (11/24 0500) BP: (92-186)/(49-128) 93/62 (11/24 0500) SpO2:  [88 %-100 %] 98 % (11/24 0500) Arterial Line BP: (59-161)/(41-74) 86/44 (11/24 0500) FiO2 (%):  [40 %-100 %] 60 % (11/24 0351) Weight:  [92.2 kg] 92.2 kg (11/24 0446) Last BM Date:  (pta)  Weight change: Filed Weights   06/05/20 0556 06/06/20 0250 06/07/20 0446  Weight: 92.9 kg 93.5 kg 92.2 kg    Intake/Output:   Intake/Output Summary (Last 24 hours) at 06/07/2020 0645 Last data filed at 06/07/2020 5638 Gross per 24 hour  Intake 2994 ml  Output 1800 ml  Net 1194 ml      Physical Exam    CVP 6 General:  Intubated HEENT: normal Neck: supple. no JVD. R swan.  Cor: PMI nondisplaced. Regular rate & rhythm. No rubs, gallops or murmurs. Lungs: Clear bilaterally.  Abdomen: Distended with hypoactive BS.  OG with dark output.  Extremities: no cyanosis, clubbing, rash, edema.  Extremities warm to touch.  Neuro: Intubated.    Telemetry   SR with intermittent pacer and PVCs.  Rates 60s.  Personally reviewed.   EKG    Wide complex on EKG 11/21 at 11:54  Labs    CBC Recent Labs    06/04/20 1148 06/04/20 1541 06/06/20 0450 06/06/20 1800  06/06/20 2036 06/07/20 0408  WBC 6.5   < > 16.3*  --   --  14.2*  NEUTROABS 1.5*  --   --   --   --   --   HGB 17.3*   < > 14.1   < > 13.9 12.6*  HCT 53.7*   < > 43.8   < > 41.0 39.8  MCV 95.2   < > 95.4  --   --  97.3  PLT 297   < > 182  --   --  124*   < > = values in this interval not displayed.   Basic Metabolic Panel Recent Labs    06/06/20 0450 06/06/20 1800 06/06/20 2036 06/07/20 0408  NA 137   < > 139 137  K 4.1   < > 5.4* 4.0  CL 105  --   --  107  CO2 19*  --   --  19*  GLUCOSE 119*  --   --  109*  BUN 16  --   --  18  CREATININE 1.52*  --   --  1.52*  CALCIUM 7.9*  --   --  8.0*  MG 2.0  --   --  1.9   < > = values in this interval not displayed.   Liver Function Tests Recent  Labs    06/04/20 1148  AST 26  ALT 22  ALKPHOS 66  BILITOT 1.1  PROT 7.3  ALBUMIN 3.9   No results for input(s): LIPASE, AMYLASE in the last 72 hours. Cardiac Enzymes No results for input(s): CKTOTAL, CKMB, CKMBINDEX, TROPONINI in the last 72 hours.  BNP: BNP (last 3 results) Recent Labs    05/03/20 2330 05/04/20 0536  BNP 1,169.8* 1,408.4*    ProBNP (last 3 results) No results for input(s): PROBNP in the last 8760 hours.   D-Dimer No results for input(s): DDIMER in the last 72 hours. Hemoglobin A1C No results for input(s): HGBA1C in the last 72 hours. Fasting Lipid Panel Recent Labs    06/06/20 0450  TRIG 91   Thyroid Function Tests No results for input(s): TSH, T4TOTAL, T3FREE, THYROIDAB in the last 72 hours.  Invalid input(s): FREET3  Other results:   Imaging    DG Abd 1 View  Result Date: 06/06/2020 CLINICAL DATA:  OG tube placement EXAM: ABDOMEN - 1 VIEW COMPARISON:  June 06, 2020 FINDINGS: The OG tube appears to project over the gastric antrum. The visualized bowel gas pattern is nonspecific. IMPRESSION: OG tube projects over the gastric antrum. Electronically Signed   By: Constance Holster M.D.   On: 06/06/2020 19:30   DG CHEST PORT 1  VIEW  Result Date: 06/06/2020 CLINICAL DATA:  Intubation. EXAM: PORTABLE CHEST 1 VIEW COMPARISON:  June 06, 2020 FINDINGS: The endotracheal tube terminates above the carina by approximately 3 cm. The Swan-Ganz catheter tip projects over the main right pulmonary artery. The enteric tube appears to extend below the left hemidiaphragm. There is a femoral approach line projecting over the right ventricle which may represent a pacing lead. There are bilateral pleural effusions which have increased from prior study. The heart size is stable. Aortic calcifications are noted. There is no pneumothorax. IMPRESSION: 1. Lines and tubes as above. 2. Worsening bilateral pleural effusions. 3. No pneumothorax. The cardiac silhouette is stable in size from prior study. Electronically Signed   By: Constance Holster M.D.   On: 06/06/2020 19:28   DG Abd Portable 1V  Result Date: 06/06/2020 CLINICAL DATA:  Abdominal distension EXAM: PORTABLE ABDOMEN - 1 VIEW COMPARISON:  06/04/2020 FINDINGS: Partially visualized enteric tube is coiled within the stomach. Right femoral central venous catheter extends beyond the superior margin of the film. Nonobstructive bowel gas pattern. No gross free intraperitoneal air. IMPRESSION: Nonobstructive bowel gas pattern. Electronically Signed   By: Davina Poke D.O.   On: 06/06/2020 08:34     Medications:     Scheduled Medications: . aspirin  81 mg Per Tube Daily  . atorvastatin  80 mg Per Tube q1800  . chlorhexidine gluconate (MEDLINE KIT)  15 mL Mouth Rinse BID  . Chlorhexidine Gluconate Cloth  6 each Topical Daily  . Chlorhexidine Gluconate Cloth  6 each Topical Daily  . clopidogrel  75 mg Per Tube Daily  . docusate  100 mg Per Tube BID  . escitalopram  10 mg Per Tube Daily  . ezetimibe  10 mg Per Tube Daily  . fentaNYL (SUBLIMAZE) injection  25 mcg Intravenous Once  . mouth rinse  15 mL Mouth Rinse 10 times per day  . pantoprazole (PROTONIX) IV  40 mg Intravenous  Daily  . polyethylene glycol  17 g Per Tube Daily  . sodium chloride flush  10-40 mL Intracatheter Q12H  . sodium chloride flush  3 mL Intravenous Q12H    Infusions: .  sodium chloride    . sodium chloride 10 mL/hr at 06/07/20 9233  . sodium chloride 10 mL/hr at 06/07/20 0076  . amiodarone 30 mg/hr (06/07/20 2263)  . ceFEPime (MAXIPIME) IV Stopped (06/06/20 2312)  . fentaNYL infusion INTRAVENOUS 150 mcg/hr (06/07/20 0628)  . heparin 1,500 Units/hr (06/07/20 3354)  . milrinone 0.125 mcg/kg/min (06/07/20 5625)  . norepinephrine (LEVOPHED) Adult infusion 2 mcg/min (06/07/20 6389)  . propofol (DIPRIVAN) infusion 15 mcg/kg/min (06/07/20 0628)  . vancomycin Stopped (06/06/20 2116)    PRN Medications: sodium chloride, sodium chloride, sodium chloride, acetaminophen (TYLENOL) oral liquid 160 mg/5 mL, ALPRAZolam, fentaNYL, nitroGLYCERIN, ondansetron (ZOFRAN) IV, sodium chloride flush, sodium chloride flush    Patient Profile   [name] a 64 y.o.malewith aPMH of CAD (s/p CABG in 2014 with LIMA-LAD, SVG-OM2 and SVG-RCA, s/p angioplasty to proximal SVG-RCA and DES to SVG-RCA anastomosisin 03/2019), chronic combined systolic and diastolic CHF/ICM (EF 37-34% by echo in 06/2019), HTN, HLD,prior CVAand recent STEMIwith angioplasty of the distal bypass graft10/21/21 d/c home with Life Vest.  Presented in VT requiring multiple amio bolus and lido but developed asystole.  Underwent external pacing and atropine then sent to Cath lab.      Assessment/Plan   1. VT arrest -> asystole - emergent cath 06/04/20 with stable coronary anatomy - suspect scar mediated VT - Continue amio 30 gtt - EP managing - will need ICD prior to d/c - Keep K > 4.0 Mg > 2.0  2. Chronic systolic HF with biventricular failure -> cardiogenic shock  - Echo 05/04/20 EF 25%. Moderate RV to severe RV dysfunction and possible LV thrombus. LifeVest placed. D/c'd home on 05/06/20 - RHC on admit with preserved  output and well compensated filling pressures.  - Echo remains unchanged from October 2021, EF 25-30%, mod LVH, global hypokineses, aortic root dilatation 3.9 and ascending aorta 4.0 - Today: CVP 6, Co-ox 72%, CI 2.88, SVR 786 - Currently on Levo 2 and milrinone 0.125 - Continue to titrate down off levo as hemodynamics tolerates - Resume GDMT as able.   3. CAD  - s/p CABG  - Inferior STEMI 05/03/20 s/p POBA SVG->PDA - Emergent cath 06/04/20 with stable coronary anatomy - C/w DAPT/statin  4. Possible LV thrombus on echo 05/04/20 - Repeat echo apical filling defect suspicious for LV thrombus vs false tendon - Discussed with EP regarding LV thrombus vs false tendon - Discussed with pharmacy anticoagulation  5. Acute hypoxic respiratory failure - intubated in setting of cardiac arrest - Extubated 11/23 at 14:22, required bipap then re-intubated 11/23 at 18:38 - CCM consulted to help manage vent and sedation, recommendations and assistance appreciated.   6.  Leukocytosis and febrile - WBC admission 6.5 w/ neutro # 1.5; peak at 21.1; maxed 101.7 (11/23) - Today 14.1; febrile 38 (low grade) - COVID, Flu negative; lactic acid 1.0 -> 1.3 -> 0.9 - PCT 0.22 -> 0.21 ->  0.31 - Cultures NGTD x 2 day & Sputum rare gram positive cocci - C/w cefepime and vancomycin (Day #2)  7.  AKI -baseline Cr on previous admission around 1.3 - Foley exchanged 11/23 d/t clot causing obstruction -Admission Cr 1.78, today Cr 1.52 -monitor I&Os and BMP  8.  Thrombocytopenia -On admission plt 297, today 124 - Currently on heparin for possible LV thrombus, discussed with pharmacy anticoagulation   Medication concerns reviewed with patient and pharmacy team. Barriers identified: Compliance with medication and LifeVest  Length of Stay: Geraldine, NP  06/07/2020, 6:45  AM  Advanced Heart Failure Team Pager [phone] (M-F; 7a - 4p)  Please contact [facility] for night-coverage after hours  (4p -7a ) and weekends on [email or web address]  Agree with above. He was extubated yesterday but quickly re-intubated.   VT quiescent on IV amio. Remains on milrinone  0.125 and NE at 8. Hemodynamics much improved. On cefipime for LLL PNA. Still with periods of bradycardia. TVP remains in but not pacing. Remains on heparin for ? LV thrombus. PLTs down slightly.   General:  Intubated sedated HEENT: normal +ETT Neck: supple. RIJ swan Carotids 2+ bilat; no bruits. No lymphadenopathy or thryomegaly appreciated. Cor: PMI nondisplaced. Regular rate & rhythm. No rubs, gallops or murmurs. Lungs: coarse Abdomen: soft, nontender, nondistended. No hepatosplenomegaly. No bruits or masses. Good bowel sounds. Extremities: no cyanosis, clubbing, rash, edema Neuro: alert & orientedx3, cranial nerves grossly intact. moves all 4 extremities w/o difficulty. Affect pleasant  Remains tenuous but somewhat improved. Continue hemodynamic support and diurese gently. Hopefully can extubate tomorrow. Will do TEE today to reassess for LV thrombus. If negative can stop heparin. Continue abx.   Once he recovers will need ICD (with RA pacing lead).   CRITICAL CARE Performed by: Glori Bickers  Total critical care time: 45 minutes  Critical care time was exclusive of separately billable procedures and treating other patients.  Critical care was necessary to treat or prevent imminent or life-threatening deterioration.  Critical care was time spent personally by me (independent of midlevel providers or residents) on the following activities: development of treatment plan with patient and/or surrogate as well as nursing, discussions with consultants, evaluation of patient's response to treatment, examination of patient, obtaining history from patient or surrogate, ordering and performing treatments and interventions, ordering and review of laboratory studies, ordering and review of radiographic studies, pulse oximetry and  re-evaluation of patient's condition.  Glori Bickers, MD  3:42 PM

## 2020-06-07 NOTE — Progress Notes (Signed)
PT Cancellation Note  Patient Details Name: [name] MRN: [id number] DOB: [birth year]   Cancelled Treatment:    Reason Eval/Treat Not Completed: Patient not medically ready;Medical issues which prohibited therapy;Other (comment) (Pt reintubated last night, nurse agreed to sign off and reorder as appropriate)  Caleb Popp, SPT [id number] [place] 06/07/2020, 8:55 AM

## 2020-06-07 NOTE — Progress Notes (Signed)
°  Echocardiogram Echocardiogram Transesophageal has been performed.  Jannett Celestine 06/07/2020, 4:48 PM

## 2020-06-07 NOTE — Plan of Care (Signed)
  Problem: Education: Goal: Ability to manage disease process will improve Outcome: Progressing   Problem: Cardiac: Goal: Ability to achieve and maintain adequate cardiopulmonary perfusion will improve Outcome: Progressing   Problem: Education: Goal: Knowledge of General Education information will improve Description: Including pain rating scale, medication(s)/side effects and non-pharmacologic comfort measures Outcome: Progressing   Problem: Health Behavior/Discharge Planning: Goal: Ability to manage health-related needs will improve Outcome: Progressing   Problem: Clinical Measurements: Goal: Ability to maintain clinical measurements within normal limits will improve Outcome: Progressing Goal: Diagnostic test results will improve Outcome: Progressing

## 2020-06-07 NOTE — Progress Notes (Signed)
OT Cancellation Note  Patient Details Name: Brailyn Delman MRN: [id number] DOB: [birth year]   Cancelled Treatment:    Reason Eval/Treat Not Completed: Patient not medically ready (Pt reintubated last night. Will sign off and await new order)  Malka So 06/07/2020, 9:36 AM  Nestor Lewandowsky, OTR/L Acute Rehabilitation Services Pager: [phone] Office: [phone]

## 2020-06-07 NOTE — Plan of Care (Signed)
  Problem: Cardiac: Goal: Ability to achieve and maintain adequate cardiopulmonary perfusion will improve Outcome: Progressing   Problem: Clinical Measurements: Goal: Ability to maintain clinical measurements within normal limits will improve Outcome: Progressing Goal: Diagnostic test results will improve Outcome: Progressing   Problem: Education: Goal: Ability to manage disease process will improve Outcome: Not Progressing   Problem: Education: Goal: Knowledge of General Education information will improve Description: Including pain rating scale, medication(s)/side effects and non-pharmacologic comfort measures Outcome: Not Progressing   Problem: Health Behavior/Discharge Planning: Goal: Ability to manage health-related needs will improve Outcome: Not Progressing

## 2020-06-07 NOTE — Progress Notes (Signed)
NAME:  [name], MRN:  [id number], DOB:  12/25/1955, LOS: 3 ADMISSION DATE:  06/04/2020, CONSULTATION DATE:  06/04/2020 REFERRING MD:  Dr. Haroldine Laws, CHIEF COMPLAINT:  VT cardiac arrest  Brief History   43 yoM with extensive cardiac history with recent STEMI 04/2020, with recurrent VT who developed severe bradycardia on amiodarone and lidocaine gtt requiring external pacing.  Intubated and emergently taken to cath lab for TVP, PA catheter, and R/LHC.  PCCM consulted for ventilator management.   History of present illness   HPI obtained from medical chart review as patient is sedated and intubated on mechanical ventilation.   64 year old male with history of CAD w/ previous CABG in 2014, chronic combined systolic and diastolic HF/ ICM, HTN, HLD, CVA, and recent inferior STEMI 05/03/20 secondary to thrombotic occlusion of the SVG to the RCA, likely due to missed ticagrelor doses.  EF noted to be 25% with moderate RV to severe RV dysfunction and possible LV thrombus 05/04/2020.  He was discharged home on 10/23 on a LifeVest, but apparently not using.    On 11/21, he [place] dizziness at church.  EMS found patient in wide complex tachycardia treated with amiodarone and lidocaine with successful conversion to NSR.  On arrival to ER, patient had recurrence of his VT but hemodynamically tolerating.  He was given additional amiodarone and lidocaine gtt started, however developed severe bradycardia requiring atropine and external pacing.  He was intubated and taken emergently for temporary transvenous pacemaker and coronary angiogram.  Returns to ICU sedated and intubated, PCCM consulted for further ventilator management.   Past Medical History  CAD (s/p CABG in 2014 with LIMA-LAD, SVG-OM2 and SVG-RCA, s/p angioplasty to proximal SVG-RCA and DES to SVG-RCA anastomosisin 03/2019), chronic combined systolic and diastolic CHF/ICM (EF 38-46% by echo in 06/2019), HTN, HLD,prior CVA  Significant  Hospital Events   11/21 Admitted cardiology/ intubated/ L/RHC, TVP, PA catheter 11/22  Remains on NE, amio, heparin gtt, decreased UOP.  CVP 8, CI 1.34, co-ox 60-66%, started on milrinone 11/23 Tmax 101.7, w/ increased respiratory secretions prompted the initiation of antibiotics. co-ox 72%, CVP 12, CI 3.2, SVR 1090. Able to titrate off sedation and decrease NE; extubated 14:22 then re-intubated at 18:38.    Consults:  HF CCM EP  Procedures:  11/21 ETT >> 11/23; 11/23 >> 11/21 R IJ cordis with PA catheter >> 11/21 R femoral TVP >> 11/21 R radial aline >>  Significant Diagnostic Tests:  11/21 R/LHC >   Severe 3 vessel occlusive CAD. 2. Patent LIMA to the LAD 3. Patent SVG to OM1 4. Patent SVG to RCA. Much improved flow from prior PCI. 5. Mildly elevated LVEDP 17 mm Hg 6. Successful placement of temporary transvenous pacemaker.  Micro Data:  11/21 SARS2/ flu >>neg 11/21 MRSA PCR >>neg 11/23 BCx 2 >> 11/23 sputum >>  Antimicrobials:  11/23 cefepime >> 11/24 vanc >> 11/24  Interim history/subjective:  No recurrence of VT, not requiring pacing Overall, better trach secrtions NE at 68mcg/min coox 71.5, CVP   Objective   Blood pressure 115/65, pulse (!) 54, temperature 99.7 F (37.6 C), resp. rate (!) 22, height 5\' 7"  (1.702 m), weight 92.2 kg, SpO2 97 %. PAP: (34-58)/(11-30) 51/24 CVP:  [1 mmHg-18 mmHg] 17 mmHg CO:  [5.9 L/min-6.8 L/min] 5.9 L/min CI:  [2.9 L/min/m2-3.3 L/min/m2] 2.9 L/min/m2  Vent Mode: PRVC FiO2 (%):  [40 %-100 %] 50 % Set Rate:  [22 bmp] 22 bmp Vt Set:  [520 mL] 520 mL PEEP:  [  5 cmH20] 5 cmH20 Pressure Support:  [8 cmH20] 8 cmH20 Plateau Pressure:  [18 cmH20-20 cmH20] 19 cmH20   Intake/Output Summary (Last 24 hours) at 06/07/2020 1019 Last data filed at 06/07/2020 1000 Gross per 24 hour  Intake 1776.87 ml  Output 960 ml  Net 816.87 ml   Filed Weights   06/05/20 0556 06/06/20 0250 06/07/20 0446  Weight: 92.9 kg 93.5 kg 92.2 kg    Examination: General:  Adult male intubated/ sedated on fentanyl 50 mcg/ propofol at 20 mcg/kg/min HEENT: MM pink/moist, ETT, OGT, poor dentition, pupils 4/reactive Neuro: sedated, RASS -1/-2, f/c, MAE CV: SB, no murmur, right femoral site soft, +dp PULM:  MV supported breaths, coarse throughout, moderate tan secretions GI: obese, soft, bs +, foley Extremities: warm/dry, trace LE edema  Skin: no rashes   CXR 11/24 stable lines/ ETT, slight increase left pleural effusion, bibasilar atelectasis  Labs: stable sCr/ UOP ~~0.6 ml/kg/hr average/ 24hrs PLT 316-> 264-> 182-> 124 Hgb 15- > 13.9-> 12.6  +514/ net +4.7L  Resolved Hospital Problem list    Assessment & Plan:   Acute hypoxic respiratory failure Pleural effusions - failed extubation attempt 11/23 requiring reintubation - continue full MV support, PRVC 8 cc/kg  - weaning FiO2/ PEEP for sat goal > 94% - daily SBT/ WUA, still weaning FiO2, currently ranging btw 50-60% - VAP bundle/ PPI - monitor effusions, consider gentle diuresis  Leukocytosis/ fever - Possible pneumonia given secretions - Cefepime, will d/c vanc given negative MRSA  - PCT 0.22-> 0.21-> 0.31 - Follow culture data  - trend WBC/ fever curve   Recurrent VT, felt to be scar mediated.  Cardiogenic shock Chronic systolic/ diastolic HF with biventricular failure ICM  CAD s/p CABG with recent inferior STEMI 04/2020 HTN s/p emergent R/LHC with patent grafts, preserved output and compensated filling pressures - Hemodynamics per Cardiology / HF team/ EP - Amiodarone infusion - NE for MAP goal > 65 - Milrinone infusion - goal K > 4/ Mag > 2   Possible LV thrombus, based on 05/04/20 TTE - heparin infusion per pharmacy; Cards/ EP planning to review TTE to determine if heparin is still needed.  Plt drop over last 5 days, concerning for HIT.   AKI on CKD stage III, baseline sCr 1.3-1.4.  - continue foley - hemodynamic support as above - trend UOP/ renal  indices - avoid nephrotoxins  Thrombocytopenia - PLT 316-> 264-> 182-> 124 - time frame on heparin gtt concerning for HIT.  Cards/ EP to determine if heparin gtt is needed.  Send HIT ab - trending CBC, to monitor Hgb levels as well, given Hgb drop 13.9-> 12.6  Best practice:  Diet: NPO; TF  Pain/Anxiety/Delirium protocol (if indicated): propofol/ fentanyl gtt VAP protocol (if indicated): yes DVT prophylaxis: heparin infusion GI prophylaxis: PPI Glucose control: add SSI if > 180 Mobility: BR Code Status: full  Family Communication: per primary  Disposition: ICU  Labs   CBC: Recent Labs  Lab 06/04/20 1148 06/04/20 1541 06/04/20 1803 06/04/20 1805 06/05/20 0334 06/06/20 0450 06/06/20 1800 06/06/20 2036 06/07/20 0408  WBC 6.5  --  21.1*  --  16.1* 16.3*  --   --  14.2*  NEUTROABS 1.5*  --   --   --   --   --   --   --   --   HGB 17.3*   < > 16.1   < > 15.0 14.1 13.3 13.9 12.6*  HCT 53.7*   < > 47.3   < >  45.4 43.8 39.0 41.0 39.8  MCV 95.2  --  92.7  --  93.2 95.4  --   --  97.3  PLT 297  --  341  --  264 182  --   --  124*   < > = values in this interval not displayed.    Basic Metabolic Panel: Recent Labs  Lab 06/04/20 1148 06/04/20 1541 06/04/20 1803 06/04/20 1805 06/05/20 0334 06/06/20 0450 06/06/20 1800 06/06/20 2036 06/07/20 0408  NA 139   < >  --    < > 139 137 144 139 137  K 4.2   < >  --    < > 3.9 4.1 4.0 5.4* 4.0  CL 104  --   --   --  109 105  --   --  107  CO2 22  --   --   --  19* 19*  --   --  19*  GLUCOSE 143*  --   --   --  124* 119*  --   --  109*  BUN 20  --   --   --  20 16  --   --  18  CREATININE 1.78*  --  1.62*  --  1.45* 1.52*  --   --  1.52*  CALCIUM 9.0  --   --   --  7.9* 7.9*  --   --  8.0*  MG  --   --   --   --  1.6* 2.0  --   --  1.9   < > = values in this interval not displayed.   GFR: Estimated Creatinine Clearance: 51.7 mL/min (A) (by C-G formula based on SCr of 1.52 mg/dL (H)). Recent Labs  Lab 06/04/20 1803  06/05/20 0334 06/05/20 0839 06/05/20 1234 06/06/20 0450 06/06/20 0835 06/06/20 1058 06/07/20 0408  PROCALCITON  --   --   --  0.22  --  0.21  --  0.31  WBC 21.1* 16.1*  --   --  16.3*  --   --  14.2*  LATICACIDVEN  --   --  1.0  --   --  1.3 0.9  --     Liver Function Tests: Recent Labs  Lab 06/04/20 1148  AST 26  ALT 22  ALKPHOS 66  BILITOT 1.1  PROT 7.3  ALBUMIN 3.9   No results for input(s): LIPASE, AMYLASE in the last 168 hours. No results for input(s): AMMONIA in the last 168 hours.  ABG    Component Value Date/Time   PHART 7.331 (L) 06/06/2020 2036   PCO2ART 45.2 06/06/2020 2036   PO2ART 179 (H) 06/06/2020 2036   HCO3 24.0 06/06/2020 2036   TCO2 25 06/06/2020 2036   ACIDBASEDEF 2.0 06/06/2020 2036   O2SAT 71.5 06/07/2020 0408     Coagulation Profile: No results for input(s): INR, PROTIME in the last 168 hours.  Cardiac Enzymes: No results for input(s): CKTOTAL, CKMB, CKMBINDEX, TROPONINI in the last 168 hours.  HbA1C: Hgb A1c MFr Bld  Date/Time Value Ref Range Status  05/03/2020 11:30 PM 6.1 (H) 4.8 - 5.6 % Final    Comment:    (NOTE)         Prediabetes: 5.7 - 6.4         Diabetes: >6.4         Glycemic control for adults with diabetes: <7.0   03/25/2019 02:39 AM 5.6 4.8 - 5.6 % Final    Comment:    (  NOTE)         Prediabetes: 5.7 - 6.4         Diabetes: >6.4         Glycemic control for adults with diabetes: <7.0     CBG: Recent Labs  Lab 06/06/20 1948 06/06/20 2327 06/07/20 0353 06/07/20 0739  GLUCAP 131* 111* 91 98      Critical care time: 35 mins      Kennieth Rad, ACNP [facility] 06/07/2020, 10:19 AM  See Shea Evans for personal pager PCCM on call pager [phone]

## 2020-06-07 NOTE — Progress Notes (Signed)
Initial Nutrition Assessment  DOCUMENTATION CODES:   Obesity unspecified  INTERVENTION:  Recommend Vital High Protein @ 40 ml/hr with 45 ml Prosource TF via OGT 5 times daily  MVI with minerals daily via tube  This regimen will provide 1160 kcal (1456 total kcal with propofol at current rate), 139 grams of protein, and 806 ml free water  NUTRITION DIAGNOSIS:   Inadequate oral intake related to inability to eat as evidenced by NPO status.    GOAL:   Patient will meet greater than or equal to 90% of their needs    MONITOR:   Vent status, Labs, Weight trends, I & O's, Diet advancement  REASON FOR ASSESSMENT:   Ventilator    ASSESSMENT:  RD working remotely.   64 year old male admitted with recurrent VT. Past medical history of CAD s/p CABG (2014), chronic combined CHF, ICM, HTN, HLD, CVA, and recent admission for STEMI (10/21) secondary to thrombotic occlusion of SVG to RCA, discharged 10/23 on LifeVest  11/21-int 11/23-ext;re-intubated 11/24 CXR - slight increase left pleural effusion  Patient failed extubation attempt, requiring subsequent re-intubation s/p successful placement of temporary transvenous pacemaker. No recurrence of VT, not requiring pacing, better trach secretions overall.   Patient has been without adequate nutrition x 48 hours, if unable to extubate today, consider initiating nutrition support with regimen as outlined above.    Weight history reviewed, stable over the last year.  Admit wt 93.4 kg                Current wt 92.2 kg  BP: 103/86 (a-line) MAP: 96 (a-line) I/O: +4776.4 ml since admit UOP: 1225 ml x 24 hrs  Patient is currently sedated and intubated on ventilator support MV: 10.9 L/min Temp (24hrs), Avg:100.2 F (37.9 C), Min:98.2 F (36.8 C), Max:101.1 F (38.4 C)  Propofol: 11.21 ml/hr provides 296 kcal  Medications reviewed and include: Colace, Miralax, Protonix  Drips: Nexterone Maxipime Heparin Primacor Fentanyl @ 15  ml/hr Levo @ 1.88 ml/hr  Labs: CBGs 063,01,60  NUTRITION - FOCUSED PHYSICAL EXAM: Unable to complete at this time, RD working remotely.   Diet Order:   Diet Order            Diet NPO time specified  Diet effective now                 EDUCATION NEEDS:   No education needs have been identified at this time  Skin:  Skin Assessment: Reviewed RN Assessment  Last BM:  pta  Height:   Ht Readings from Last 1 Encounters:  06/04/20 5\' 7"  (1.702 m)    Weight:   Wt Readings from Last 1 Encounters:  06/07/20 92.2 kg    BMI:  Body mass index is 31.84 kg/m.  Estimated Nutritional Needs:   Kcal:  1093-2355  Protein:  135  Fluid:  >/= 1.3L   Lajuan Lines, RD, LDN Clinical Nutrition After Hours/Weekend Pager # in [place]

## 2020-06-07 NOTE — CV Procedure (Signed)
° ° °  TRANSESOPHAGEAL ECHOCARDIOGRAM   NAME:  Ty Buntrock   MRN: [id number] DOB:  1956/03/19   ADMIT DATE: 06/04/2020  INDICATIONS:  Possible LV thrombus  PROCEDURE:   Emergent consent.   The patient was already sedated on the vent. An additional 2mg  of versed was given. The transesophageal probe was inserted in the esophagus and stomach without difficulty and multiple views were obtained.    COMPLICATIONS:    There were no immediate complications.  FINDINGS:  LEFT VENTRICLE: EF = 30-35%. Inferolateral HK  RIGHT VENTRICLE: Mild HK   LEFT ATRIUM: Dilated  LEFT ATRIAL APPENDAGE: No thrombus.   RIGHT ATRIUM: Normal  AORTIC VALVE:  Trileaflet. Trivial AI  MITRAL VALVE:    Normal. Mild MR  TRICUSPID VALVE: Normal. Mild TR.   PULMONIC VALVE: Grossly normal. Trivial PI  INTERATRIAL SEPTUM: No PFO or ASD.  PERICARDIUM: No effusion  DESCENDING AORTA: Not well seen    Benay Spice 4:38 PM

## 2020-06-07 NOTE — Progress Notes (Addendum)
SLP Cancellation Note  Patient Details Name: [name] MRN: [id number] DOB: [birth year]   Cancelled treatment:       Reason Eval/Treat Not Completed: Patient not medically ready, reintubated, will sign off   Emony Dormer, Katherene Ponto 06/07/2020, 7:50 AM

## 2020-06-08 DIAGNOSIS — I472 Ventricular tachycardia: Secondary | ICD-10-CM | POA: Diagnosis not present

## 2020-06-08 DIAGNOSIS — R570 Cardiogenic shock: Secondary | ICD-10-CM | POA: Diagnosis not present

## 2020-06-08 DIAGNOSIS — J9691 Respiratory failure, unspecified with hypoxia: Secondary | ICD-10-CM | POA: Diagnosis not present

## 2020-06-08 DIAGNOSIS — J9601 Acute respiratory failure with hypoxia: Secondary | ICD-10-CM | POA: Diagnosis not present

## 2020-06-08 LAB — BASIC METABOLIC PANEL
Anion gap: 11 (ref 5–15)
BUN: 18 mg/dL (ref 8–23)
CO2: 21 mmol/L — ABNORMAL LOW (ref 22–32)
Calcium: 7.7 mg/dL — ABNORMAL LOW (ref 8.9–10.3)
Chloride: 105 mmol/L (ref 98–111)
Creatinine, Ser: 1.37 mg/dL — ABNORMAL HIGH (ref 0.61–1.24)
GFR, Estimated: 57 mL/min — ABNORMAL LOW (ref >60)
Glucose, Bld: 104 mg/dL — ABNORMAL HIGH (ref 70–99)
Potassium: 3.6 mmol/L (ref 3.5–5.1)
Sodium: 137 mmol/L (ref 135–145)

## 2020-06-08 LAB — GLUCOSE, CAPILLARY
Glucose-Capillary: 108 mg/dL — ABNORMAL HIGH (ref 70–99)
Glucose-Capillary: 112 mg/dL — ABNORMAL HIGH (ref 70–99)
Glucose-Capillary: 126 mg/dL — ABNORMAL HIGH (ref 70–99)
Glucose-Capillary: 83 mg/dL (ref 70–99)
Glucose-Capillary: 89 mg/dL (ref 70–99)
Glucose-Capillary: 92 mg/dL (ref 70–99)
Glucose-Capillary: 99 mg/dL (ref 70–99)

## 2020-06-08 LAB — COOXEMETRY PANEL
Carboxyhemoglobin: 0.9 % (ref 0.5–1.5)
Methemoglobin: 0.9 % (ref 0.0–1.5)
O2 Saturation: 99 %
Total hemoglobin: 13.1 g/dL (ref 12.0–16.0)

## 2020-06-08 LAB — CBC
HCT: 38.2 % — ABNORMAL LOW (ref 39.0–52.0)
Hemoglobin: 12.9 g/dL — ABNORMAL LOW (ref 13.0–17.0)
MCH: 31.6 pg (ref 26.0–34.0)
MCHC: 33.8 g/dL (ref 30.0–36.0)
MCV: 93.6 fL (ref 80.0–100.0)
Platelets: 152 10*3/uL (ref 150–400)
RBC: 4.08 MIL/uL — ABNORMAL LOW (ref 4.22–5.81)
RDW: 13.1 % (ref 11.5–15.5)
WBC: 11.8 10*3/uL — ABNORMAL HIGH (ref 4.0–10.5)
nRBC: 0 % (ref 0.0–0.2)

## 2020-06-08 LAB — CULTURE, RESPIRATORY W GRAM STAIN
Culture: NORMAL
Special Requests: NORMAL

## 2020-06-08 LAB — MAGNESIUM: Magnesium: 1.8 mg/dL (ref 1.7–2.4)

## 2020-06-08 LAB — HEPARIN INDUCED PLATELET AB (HIT ANTIBODY): Heparin Induced Plt Ab: 0.079 OD (ref 0.000–0.400)

## 2020-06-08 LAB — PROCALCITONIN: Procalcitonin: 0.53 ng/mL

## 2020-06-08 LAB — TRIGLYCERIDES: Triglycerides: 190 mg/dL — ABNORMAL HIGH (ref <150)

## 2020-06-08 MED ORDER — ALBUTEROL SULFATE (2.5 MG/3ML) 0.083% IN NEBU
2.5000 mg | INHALATION_SOLUTION | RESPIRATORY_TRACT | Status: DC | PRN
  Administered 2020-06-09 (×2): 2.5 mg via RESPIRATORY_TRACT
  Filled 2020-06-08 (×6): qty 3

## 2020-06-08 MED ORDER — MAGNESIUM SULFATE 2 GM/50ML IV SOLN
2.0000 g | Freq: Once | INTRAVENOUS | Status: AC
  Administered 2020-06-08: 2 g via INTRAVENOUS
  Filled 2020-06-08: qty 50

## 2020-06-08 MED ORDER — ACETYLCYSTEINE 10 % IN SOLN
2.0000 mL | Freq: Three times a day (TID) | RESPIRATORY_TRACT | Status: DC
  Filled 2020-06-08 (×4): qty 2

## 2020-06-08 MED ORDER — GUAIFENESIN 100 MG/5ML PO SOLN: 5.0000 mL | ORAL | Status: DC | PRN

## 2020-06-08 MED ORDER — ACETYLCYSTEINE 20 % IN SOLN
1.0000 mL | Freq: Three times a day (TID) | RESPIRATORY_TRACT | Status: DC
  Administered 2020-06-09 (×2): 1 mL via RESPIRATORY_TRACT
  Filled 2020-06-08 (×7): qty 4

## 2020-06-08 MED ORDER — POTASSIUM CHLORIDE 20 MEQ PO PACK
40.0000 meq | PACK | Freq: Once | ORAL | Status: AC
  Administered 2020-06-08: 40 meq
  Filled 2020-06-08: qty 2

## 2020-06-08 NOTE — Plan of Care (Signed)
  Problem: Cardiac: Goal: Ability to achieve and maintain adequate cardiopulmonary perfusion will improve Outcome: Progressing   Problem: Clinical Measurements: Goal: Diagnostic test results will improve Outcome: Progressing

## 2020-06-08 NOTE — Progress Notes (Signed)
Advanced Heart Failure Rounding Note  PCP-Cardiologist: Sanda Klein, MD   Subjective:    Remains intubated. No further VT on IV amio.   TEE yesterday. EF 30-35% no evidence of LV clot.   On cefepime for PNA. Tmax 100.6. WBC 14.2 -> 11.8. Secretions improved.   Remains on NE 5 milrinone 0.125. Co-ox inacuurate  Swan #s CVP 8 PA 44/17 (22) PCW 13 Thermo 5.0/2.45  Objective:   Weight Range: 91.6 kg Body mass index is 31.63 kg/m.   Vital Signs:   Temp:  [98.6 F (37 C)-100.6 F (38.1 C)] 99.1 F (37.3 C) (11/25 0841) Pulse Rate:  [45-61] 45 (11/25 0841) Resp:  [0-23] 22 (11/25 0841) BP: (94-132)/(63-95) 121/70 (11/25 0841) SpO2:  [96 %-99 %] 99 % (11/25 0841) Arterial Line BP: (80-141)/(57-100) 125/69 (11/25 0500) FiO2 (%):  [50 %-60 %] 50 % (11/25 0841) Weight:  [91.6 kg] 91.6 kg (11/25 0500) Last BM Date:  (pta)  Weight change: Filed Weights   06/06/20 0250 06/07/20 0446 06/08/20 0500  Weight: 93.5 kg 92.2 kg 91.6 kg    Intake/Output:   Intake/Output Summary (Last 24 hours) at 06/08/2020 1024 Last data filed at 06/08/2020 0540 Gross per 24 hour  Intake 2008.28 ml  Output 4240 ml  Net -2231.72 ml      Physical Exam    General:  Intubated. Awake on vent. Follows commands HEENT: normal +ETT Neck: supple. RIJ swan Carotids 2+ bilat; no bruits. No lymphadenopathy or thryomegaly appreciated. Cor: PMI nondisplaced. Regular rate & rhythm. No rubs, gallops or murmurs. Lungs: clear Abdomen: soft, nontender, nondistended. No hepatosplenomegaly. No bruits or masses. Good bowel sounds. Extremities: no cyanosis, clubbing, rash, edema Neuro: awake on vent. Follows command   Telemetry   Sinus brady 40s occasional PVCs. No VT. Pacer wires working well. Personally reviewed   Labs    CBC Recent Labs    06/07/20 0408 06/08/20 0530  WBC 14.2* 11.8*  HGB 12.6* 12.9*  HCT 39.8 38.2*  MCV 97.3 93.6  PLT 124* 482   Basic Metabolic Panel Recent  Labs    06/07/20 0408 06/08/20 0530  NA 137 137  K 4.0 3.6  CL 107 105  CO2 19* 21*  GLUCOSE 109* 104*  BUN 18 18  CREATININE 1.52* 1.37*  CALCIUM 8.0* 7.7*  MG 1.9 1.8   Liver Function Tests No results for input(s): AST, ALT, ALKPHOS, BILITOT, PROT, ALBUMIN in the last 72 hours. No results for input(s): LIPASE, AMYLASE in the last 72 hours. Cardiac Enzymes No results for input(s): CKTOTAL, CKMB, CKMBINDEX, TROPONINI in the last 72 hours.  BNP: BNP (last 3 results) Recent Labs    05/03/20 2330 05/04/20 0536  BNP 1,169.8* 1,408.4*    ProBNP (last 3 results) No results for input(s): PROBNP in the last 8760 hours.   D-Dimer No results for input(s): DDIMER in the last 72 hours. Hemoglobin A1C No results for input(s): HGBA1C in the last 72 hours. Fasting Lipid Panel Recent Labs    06/08/20 0530  TRIG 190*   Thyroid Function Tests No results for input(s): TSH, T4TOTAL, T3FREE, THYROIDAB in the last 72 hours.  Invalid input(s): FREET3  Other results:   Imaging    DG CHEST PORT 1 VIEW  Result Date: 06/07/2020 CLINICAL DATA:  Heart rate problem. EXAM: PORTABLE CHEST 1 VIEW COMPARISON:  June 07, 2020 (7:45 a.m.) FINDINGS: There is stable endotracheal tube, nasogastric tube and right internal jugular Swan-Ganz catheter positioning. A pacing lead is again seen  likely entering via a femoral approach. Its distal tip projects just below the expected region of the diaphragm, along the midline. Mild, stable atelectasis and/or infiltrate is seen within the retrocardiac region of the left lung base. Very mild stable linear atelectasis is also seen within the right lung base. A very small, stable left pleural effusion is seen. No pneumothorax is identified. The cardiac silhouette is mildly enlarged and unchanged in size. The visualized skeletal structures are unremarkable. IMPRESSION: 1. Stable line positioning with stable left basilar atelectasis and/or infiltrate. 2. Very  small, stable left pleural effusion. Electronically Signed   By: Virgina Norfolk M.D.   On: 06/07/2020 17:00     Medications:     Scheduled Medications: . aspirin  81 mg Per Tube Daily  . atorvastatin  80 mg Per Tube q1800  . chlorhexidine gluconate (MEDLINE KIT)  15 mL Mouth Rinse BID  . Chlorhexidine Gluconate Cloth  6 each Topical Daily  . Chlorhexidine Gluconate Cloth  6 each Topical Daily  . clopidogrel  75 mg Per Tube Daily  . docusate  100 mg Per Tube BID  . enoxaparin (LOVENOX) injection  40 mg Subcutaneous Q24H  . escitalopram  10 mg Per Tube Daily  . ezetimibe  10 mg Per Tube Daily  . fentaNYL (SUBLIMAZE) injection  25 mcg Intravenous Once  . mouth rinse  15 mL Mouth Rinse 10 times per day  . pantoprazole (PROTONIX) IV  40 mg Intravenous Daily  . polyethylene glycol  17 g Per Tube Daily  . sodium chloride flush  10-40 mL Intracatheter Q12H  . sodium chloride flush  3 mL Intravenous Q12H    Infusions: . sodium chloride 10 mL/hr at 06/08/20 0540  . sodium chloride 10 mL/hr at 06/08/20 0540  . amiodarone 30 mg/hr (06/08/20 1021)  . ceFEPime (MAXIPIME) IV Stopped (06/07/20 2152)  . fentaNYL infusion INTRAVENOUS 50 mcg/hr (06/08/20 0540)  . milrinone 0.125 mcg/kg/min (06/08/20 0540)  . norepinephrine (LEVOPHED) Adult infusion 5 mcg/min (06/08/20 0540)  . propofol (DIPRIVAN) infusion 30 mcg/kg/min (06/08/20 0540)    PRN Medications: sodium chloride, sodium chloride, acetaminophen (TYLENOL) oral liquid 160 mg/5 mL, ALPRAZolam, fentaNYL, nitroGLYCERIN, ondansetron (ZOFRAN) IV, sodium chloride flush, sodium chloride flush    Patient Profile   [name] a 64 y.o.malewith aPMH of CAD (s/p CABG in 2014 with LIMA-LAD, SVG-OM2 and SVG-RCA, s/p angioplasty to proximal SVG-RCA and DES to SVG-RCA anastomosisin 03/2019), chronic combined systolic and diastolic CHF/ICM (EF 16-10% by echo in 06/2019), HTN, HLD,prior CVAand recent STEMIwith angioplasty of the distal  bypass graft10/21/21 d/c home with Life Vest.  Presented in VT requiring multiple amio bolus and lido but developed asystole.  Underwent external pacing and atropine then sent to Cath lab.      Assessment/Plan   1. VT arrest -> asystole - emergent cath 06/04/20 with stable coronary anatomy - suspect scar mediated VT - Continue amio 30 gtt - EP managing - will need dual chamber ICD prior to d/c - Keep K > 4.0 Mg > 2.0  2. Acute on Chronic systolic HF with biventricular failure -> cardiogenic shock  - Echo 05/04/20 EF 25%. Moderate RV to severe RV dysfunction and possible LV thrombus. LifeVest placed. D/c'd home on 05/06/20 - RHC on admit with preserved output and well compensated filling pressures.  - Echo remains unchanged from October 2021, EF 25-30%, mod LVH, global hypokineses, aortic root dilatation 3.9 and ascending aorta 4.0 - TEE 11/24 EF 30-35% - Currently on Levo 5 and  milrinone 0.125 - Swan numbers look good. - Continue inotropes for support until extubated - Volume ok. No need for lasix today - Resume GDMT as able.   3. CAD  - s/p CABG  - Inferior STEMI 05/03/20 s/p POBA SVG->PDA - Emergent cath 06/04/20 with stable coronary anatomy - C/w DAPT/statin - No s/s angina  4. Possible LV thrombus on echo 05/04/20 - Repeat echo apical filling defect suspicious for LV thrombus vs false tendon -TEE 11/24 no LV thrombus - off AC  5. Acute hypoxic respiratory failure - intubated in setting of cardiac arrest - Extubated 11/23 at 14:22, required bipap then re-intubated 11/23 at 18:38 - CCM consulted to help manage vent and sedation, recommendations and assistance appreciated.  - Wean vent as tolerated. Optimize with treatment of PNA and diuresis. Continue inotropes to support output. Main issue now is PNA and secretions.   6.  LL PNA (HCAP) - COVID, Flu negative; lactic acid 1.0 -> 1.3 -> 0.9 - PCT 0.22 -> 0.21 ->  0.31 -> 0.53 - Cultures NGTD x 4 day & Sputum rare gram  positive cocci - Remains on cefepime (vanc stopped)   7.  AKI -baseline Cr on previous admission around 1.3 - Foley exchanged 11/23 d/t clot causing obstruction -Admission Cr 1.78, today Cr 1.37 -monitor I&Os and BMP  8.  Thrombocytopenia - Resolved  CRITICAL CARE Performed by: Glori Bickers  Total critical care time: 35 minutes  Critical care time was exclusive of separately billable procedures and treating other patients.  Critical care was necessary to treat or prevent imminent or life-threatening deterioration.  Critical care was time spent personally by me (independent of midlevel providers or residents) on the following activities: development of treatment plan with patient and/or surrogate as well as nursing, discussions with consultants, evaluation of patient's response to treatment, examination of patient, obtaining history from patient or surrogate, ordering and performing treatments and interventions, ordering and review of laboratory studies, ordering and review of radiographic studies, pulse oximetry and re-evaluation of patient's condition.    Length of Stay: [place], MD  06/08/2020, 10:24 AM  Advanced Heart Failure Team Pager [phone] (M-F; 7a - 4p)  Please contact [facility] for night-coverage after hours (4p -7a ) and weekends on [email or web address]

## 2020-06-08 NOTE — Progress Notes (Addendum)
NAME:  [name], MRN:  [id number], DOB:  1956/05/27, LOS: 4 ADMISSION DATE:  06/04/2020, CONSULTATION DATE:  06/04/2020 REFERRING MD:  Dr. Haroldine Laws, CHIEF COMPLAINT:  VT cardiac arrest  Brief History   89 yoM with extensive cardiac history with recent STEMI 04/2020, with recurrent VT who developed severe bradycardia on amiodarone and lidocaine gtt requiring external pacing.  Intubated and emergently taken to cath lab for TVP, PA catheter, and R/LHC.  PCCM consulted for ventilator management.   History of present illness   HPI obtained from medical chart review as patient is sedated and intubated on mechanical ventilation.   64 year old male with history of CAD w/ previous CABG in 2014, chronic combined systolic and diastolic HF/ ICM, HTN, HLD, CVA, and recent inferior STEMI 05/03/20 secondary to thrombotic occlusion of the SVG to the RCA, likely due to missed ticagrelor doses.  EF noted to be 25% with moderate RV to severe RV dysfunction and possible LV thrombus 05/04/2020.  He was discharged home on 10/23 on a LifeVest, but apparently not using.    On 11/21, he developed dizziness at church.  EMS found patient in wide complex tachycardia treated with amiodarone and lidocaine with successful conversion to NSR.  On arrival to ER, patient had recurrence of his VT but hemodynamically tolerating.  He was given additional amiodarone and lidocaine gtt started, however developed severe bradycardia requiring atropine and external pacing.  He was intubated and taken emergently for temporary transvenous pacemaker and coronary angiogram.  Returns to ICU sedated and intubated, PCCM consulted for further ventilator management.   Past Medical History  CAD (s/p CABG in 2014 with LIMA-LAD, SVG-OM2 and SVG-RCA, s/p angioplasty to proximal SVG-RCA and DES to SVG-RCA anastomosisin 03/2019), chronic combined systolic and diastolic CHF/ICM (EF 08-14% by echo in 06/2019), HTN, HLD,prior CVA  Significant  Hospital Events   11/21 Admitted cardiology/ intubated/ L/RHC, TVP, PA catheter 11/22  Remains on NE, amio, heparin gtt, decreased UOP.  CVP 8, CI 1.34, co-ox 60-66%, started on milrinone 11/23 Tmax 101.7, w/ increased respiratory secretions prompted the initiation of antibiotics. co-ox 72%, CVP 12, CI 3.2, SVR 1090. Able to titrate off sedation and decrease NE; extubated 14:22 then re-intubated at 18:38.    Consults:  HF CCM EP  Procedures:  11/21 ETT >> 11/23; 11/23 >> 11/21 R IJ cordis with PA catheter >> 11/21 R femoral TVP >> 11/21 R radial aline >>  Significant Diagnostic Tests:  11/21 R/LHC >   Severe 3 vessel occlusive CAD. 2. Patent LIMA to the LAD 3. Patent SVG to OM1 4. Patent SVG to RCA. Much improved flow from prior PCI. 5. Mildly elevated LVEDP 17 mm Hg 6. Successful placement of temporary transvenous pacemaker.  Micro Data:  11/21 SARS2/ flu >>neg 11/21 MRSA PCR >>neg 11/23 BCx 2 >> 11/23 sputum >>  Antimicrobials:  11/23 cefepime >> 11/24 vanc >> 11/24  Interim history/subjective:  No recurrence of VT, not requiring pacing Overall, better trach secrtions  Objective   Blood pressure 113/65, pulse (!) 45, temperature 98.6 F (37 C), resp. rate (!) 22, height 5\' 7"  (1.702 m), weight 91.6 kg, SpO2 96 %. PAP: (37-56)/(18-37) 44/18 CVP:  [10 mmHg-19 mmHg] 10 mmHg CO:  [5 L/min] 5 L/min CI:  [2.5 L/min/m2] 2.5 L/min/m2  Vent Mode: PRVC FiO2 (%):  [50 %] 50 % Set Rate:  [22 bmp] 22 bmp Vt Set:  [520 mL] 520 mL PEEP:  [5 Lamar Pressure:  [17 [id number]  cmH20] 18 cmH20   Intake/Output Summary (Last 24 hours) at 06/08/2020 1324 Last data filed at 06/08/2020 1200 Gross per 24 hour  Intake 2008.28 ml  Output 4405 ml  Net -2396.72 ml   Filed Weights   06/06/20 0250 06/07/20 0446 06/08/20 0500  Weight: 93.5 kg 92.2 kg 91.6 kg   PAP: (37-56)/(18-37) 44/18 CVP:  [10 mmHg-19 mmHg] 10 mmHg CO:  [5 L/min] 5 L/min CI:  [2.5 L/min/m2] 2.5  L/min/m2   Examination: General:  Adult male intubated/ sedated on fentanyl 50 mcg/ propofol at 20 mcg/kg/min HEENT: MM pink/moist, ETT, OGT, poor dentition, pupils 4/reactive Neuro: sedated, RASS -1/-2, f/c, MAE CV: SB, no murmur, right femoral site soft, +dp PULM:  MV supported breaths, coarse throughout, moderate tan secretions GI: obese, soft, bs +, foley Extremities: warm/dry, trace LE edema  Skin: no rashes    Resolved Hospital Problem list    Assessment & Plan:   [place] ill due to acute hypoxic respiratory failure requiring mechanical ventilation secondary to HCAP failed extubation attempt 11/23 requiring reintubation -Aggressive pulmonary toilet today -Trial of mucolytics -Initiate SBT tomorrow and extubate if tolerates.  Leukocytosis/ fever - Possible pneumonia given secretions -Complete 7 days of cefepime  Recurrent VT, felt to be scar versus bradycardia mediated.  -No further arrhythmias on amiodarone -We will eventually need an AICD.  Cardiogenic shock Chronic systolic/ diastolic HF with biventricular failure ICM  CAD s/p CABG with recent inferior STEMI 04/2020 HTN -Hemodynamics currently optimized.  Continue milrinone at current dose -No diuresis today   AKI on CKD stage III, baseline sCr 1.3-1.4.  Now back to baseline - continue foley - hemodynamic support as above - trend UOP/ renal indices - avoid nephrotoxins  Thrombocytopenia has now resolved   Best practice:  Diet: NPO; TF  Pain/Anxiety/Delirium protocol (if indicated): propofol/ fentanyl gtt VAP protocol (if indicated): yes DVT prophylaxis: Geralynn Ochs apparent GI prophylaxis: PPI Glucose control: add SSI if > 180 Mobility: BR Code Status: full  Family Communication: Family updated at bedside today.  Told that we expect to proceed to extubation possibly tomorrow. Disposition: ICU  Labs   CBC: Recent Labs  Lab 06/04/20 1148 06/04/20 1541 06/04/20 1803 06/04/20 1805 06/05/20 0334  06/05/20 0334 06/06/20 0450 06/06/20 1800 06/06/20 2036 06/07/20 0408 06/08/20 0530  WBC 6.5   < > 21.1*  --  16.1*  --  16.3*  --   --  14.2* 11.8*  NEUTROABS 1.5*  --   --   --   --   --   --   --   --   --   --   HGB 17.3*   < > 16.1   < > 15.0   < > 14.1 13.3 13.9 12.6* 12.9*  HCT 53.7*   < > 47.3   < > 45.4   < > 43.8 39.0 41.0 39.8 38.2*  MCV 95.2   < > 92.7  --  93.2  --  95.4  --   --  97.3 93.6  PLT 297   < > 341  --  264  --  182  --   --  124* 152   < > = values in this interval not displayed.    Basic Metabolic Panel: Recent Labs  Lab 06/04/20 1148 06/04/20 1541 06/04/20 1803 06/04/20 1805 06/05/20 0334 06/05/20 0334 06/06/20 0450 06/06/20 1800 06/06/20 2036 06/07/20 0408 06/08/20 0530  NA 139   < >  --    < > 139   < >  137 144 139 137 137  K 4.2   < >  --    < > 3.9   < > 4.1 4.0 5.4* 4.0 3.6  CL 104  --   --   --  109  --  105  --   --  107 105  CO2 22  --   --   --  19*  --  19*  --   --  19* 21*  GLUCOSE 143*  --   --   --  124*  --  119*  --   --  109* 104*  BUN 20  --   --   --  20  --  16  --   --  18 18  CREATININE 1.78*   < > 1.62*  --  1.45*  --  1.52*  --   --  1.52* 1.37*  CALCIUM 9.0  --   --   --  7.9*  --  7.9*  --   --  8.0* 7.7*  MG  --   --   --   --  1.6*  --  2.0  --   --  1.9 1.8   < > = values in this interval not displayed.   GFR: Estimated Creatinine Clearance: 57.2 mL/min (A) (by C-G formula based on SCr of 1.37 mg/dL (H)). Recent Labs  Lab 06/05/20 0334 06/05/20 0839 06/05/20 1234 06/06/20 0450 06/06/20 0835 06/06/20 1058 06/07/20 0408 06/08/20 0530  PROCALCITON  --   --  0.22  --  0.21  --  0.31 0.53  WBC 16.1*  --   --  16.3*  --   --  14.2* 11.8*  LATICACIDVEN  --  1.0  --   --  1.3 0.9  --   --     Liver Function Tests: Recent Labs  Lab 06/04/20 1148  AST 26  ALT 22  ALKPHOS 66  BILITOT 1.1  PROT 7.3  ALBUMIN 3.9   No results for input(s): LIPASE, AMYLASE in the last 168 hours. No results for input(s):  AMMONIA in the last 168 hours.  ABG    Component Value Date/Time   PHART 7.331 (L) 06/06/2020 2036   PCO2ART 45.2 06/06/2020 2036   PO2ART 179 (H) 06/06/2020 2036   HCO3 24.0 06/06/2020 2036   TCO2 25 06/06/2020 2036   ACIDBASEDEF 2.0 06/06/2020 2036   O2SAT 99.0 06/08/2020 0645     Coagulation Profile: No results for input(s): INR, PROTIME in the last 168 hours.  Cardiac Enzymes: No results for input(s): CKTOTAL, CKMB, CKMBINDEX, TROPONINI in the last 168 hours.  HbA1C: Hgb A1c MFr Bld  Date/Time Value Ref Range Status  05/03/2020 11:30 PM 6.1 (H) 4.8 - 5.6 % Final    Comment:    (NOTE)         Prediabetes: 5.7 - 6.4         Diabetes: >6.4         Glycemic control for adults with diabetes: <7.0   03/25/2019 02:39 AM 5.6 4.8 - 5.6 % Final    Comment:    (NOTE)         Prediabetes: 5.7 - 6.4         Diabetes: >6.4         Glycemic control for adults with diabetes: <7.0     CBG: Recent Labs  Lab 06/07/20 2001 06/08/20 0026 06/08/20 0456 06/08/20 0732 06/08/20 1131  GLUCAP 103* 112* 108* 92 126*  CRITICAL CARE Performed by: Kipp Brood   Total critical care time: 40 minutes  Critical care time was exclusive of separately billable procedures and treating other patients.  Critical care was necessary to treat or prevent imminent or life-threatening deterioration.  Critical care was time spent personally by me on the following activities: development of treatment plan with patient and/or surrogate as well as nursing, discussions with consultants, evaluation of patient's response to treatment, examination of patient, obtaining history from patient or surrogate, ordering and performing treatments and interventions, ordering and review of laboratory studies, ordering and review of radiographic studies, pulse oximetry, re-evaluation of patient's condition and participation in multidisciplinary rounds.  Kipp Brood, MD [facility] ICU Physician [place]  Pager: [phone] Mobile: [phone] After hours: [phone].

## 2020-06-08 NOTE — Progress Notes (Addendum)
Progress Note  Patient Name: [name] Date of Encounter: 06/08/2020  Primary Cardiologist: Sanda Klein, MD   Subjective   No acute events overnight.  TEE yesterday showed no evidence of a left ventricular thrombus.  Secretions are improving.  Mental status is intact.  Inpatient Medications    Scheduled Meds: . aspirin  81 mg Per Tube Daily  . atorvastatin  80 mg Per Tube q1800  . chlorhexidine gluconate (MEDLINE KIT)  15 mL Mouth Rinse BID  . Chlorhexidine Gluconate Cloth  6 each Topical Daily  . Chlorhexidine Gluconate Cloth  6 each Topical Daily  . clopidogrel  75 mg Per Tube Daily  . docusate  100 mg Per Tube BID  . enoxaparin (LOVENOX) injection  40 mg Subcutaneous Q24H  . escitalopram  10 mg Per Tube Daily  . ezetimibe  10 mg Per Tube Daily  . fentaNYL (SUBLIMAZE) injection  25 mcg Intravenous Once  . mouth rinse  15 mL Mouth Rinse 10 times per day  . pantoprazole (PROTONIX) IV  40 mg Intravenous Daily  . polyethylene glycol  17 g Per Tube Daily  . sodium chloride flush  10-40 mL Intracatheter Q12H  . sodium chloride flush  3 mL Intravenous Q12H   Continuous Infusions: . sodium chloride 10 mL/hr at 06/08/20 0540  . sodium chloride 10 mL/hr at 06/08/20 0540  . amiodarone 30 mg/hr (06/08/20 0540)  . ceFEPime (MAXIPIME) IV Stopped (06/07/20 2152)  . fentaNYL infusion INTRAVENOUS 50 mcg/hr (06/08/20 0540)  . milrinone 0.125 mcg/kg/min (06/08/20 0540)  . norepinephrine (LEVOPHED) Adult infusion 5 mcg/min (06/08/20 0540)  . propofol (DIPRIVAN) infusion 30 mcg/kg/min (06/08/20 0540)   PRN Meds: sodium chloride, sodium chloride, acetaminophen (TYLENOL) oral liquid 160 mg/5 mL, ALPRAZolam, fentaNYL, nitroGLYCERIN, ondansetron (ZOFRAN) IV, sodium chloride flush, sodium chloride flush   Vital Signs    Vitals:   06/08/20 0200 06/08/20 0300 06/08/20 0400 06/08/20 0500  BP: 121/73 102/71 101/64 112/67  Pulse: (!) 51 (!) 59 (!) 52 (!) 50  Resp: (!) 22 (!) 22 (!)  22 (!) 22  Temp: 100 F (37.8 C) (!) 100.6 F (38.1 C) 100 F (37.8 C) 99.7 F (37.6 C)  TempSrc:      SpO2: 96% 97% 97% 97%  Weight:    91.6 kg  Height:        Intake/Output Summary (Last 24 hours) at 06/08/2020 0719 Last data filed at 06/08/2020 0540 Gross per 24 hour  Intake 2008.28 ml  Output 4340 ml  Net -2331.72 ml   Filed Weights   06/06/20 0250 06/07/20 0446 06/08/20 0500  Weight: 93.5 kg 92.2 kg 91.6 kg    Telemetry    Sinus rhythm.  Ventricular rates are ranging from 40 to 60 bpm.  No sustained ventricular arrhythmias.- Personally Reviewed  ECG    No new  Physical Exam   GEN:  Intubated and sedated Neck:  Right internal jugular catheter. Cardiac:  Warm extremities.  Regular rhythm.  No murmurs. Respiratory: Clear to auscultation bilaterally.  Good aeration at bilateral bases.  Ventilated. GI: Soft MS: No deformity. Neuro:   Sedated but awakens to tactile stimuli Psych: [place]  Lab 06/04/20 1148 06/04/20 1541 06/06/20 0450 06/06/20 1800 06/06/20 2036 06/07/20 0408 06/08/20 0530  NA 139   < > 137   < > 139 137 137  K 4.2   < > 4.1   < > 5.4* 4.0 3.6  CL 104   < >  105  --   --  107 105  CO2 22   < > 19*  --   --  19* 21*  GLUCOSE 143*   < > 119*  --   --  109* 104*  BUN 20   < > 16  --   --  18 18  CREATININE 1.78*   < > 1.52*  --   --  1.52* 1.37*  CALCIUM 9.0   < > 7.9*  --   --  8.0* 7.7*  PROT 7.3  --   --   --   --   --   --   ALBUMIN 3.9  --   --   --   --   --   --   AST 26  --   --   --   --   --   --   ALT 22  --   --   --   --   --   --   ALKPHOS 66  --   --   --   --   --   --   BILITOT 1.1  --   --   --   --   --   --   GFRNONAA 42*   < > 50*  --   --  50* 57*  ANIONGAP 13   < > 13  --   --  11 11   < > = values in this interval not displayed.     Hematology Recent Labs  Lab 06/06/20 0450 06/06/20 1800 06/06/20 2036 06/07/20 0408 06/08/20 0530  WBC 16.3*  --   --  14.2* 11.8*  RBC  4.59  --   --  4.09* 4.08*  HGB 14.1   < > 13.9 12.6* 12.9*  HCT 43.8   < > 41.0 39.8 38.2*  MCV 95.4  --   --  97.3 93.6  MCH 30.7  --   --  30.8 31.6  MCHC 32.2  --   --  31.7 33.8  RDW 12.8  --   --  13.0 13.1  PLT 182  --   --  124* 152   < > = values in this interval not displayed.    Cardiac EnzymesNo results for input(s): TROPONINI in the last 168 hours. No results for input(s): TROPIPOC in the last 168 hours.   BNPNo results for input(s): BNP, PROBNP in the last 168 hours.   DDimer No results for input(s): DDIMER in the last 168 hours.   Radiology    DG Abd 1 View  Result Date: 06/06/2020 CLINICAL DATA:  OG tube placement EXAM: ABDOMEN - 1 VIEW COMPARISON:  June 06, 2020 FINDINGS: The OG tube appears to project over the gastric antrum. The visualized bowel gas pattern is nonspecific. IMPRESSION: OG tube projects over the gastric antrum. Electronically Signed   By: Constance Holster M.D.   On: 06/06/2020 19:30   DG CHEST PORT 1 VIEW  Result Date: 06/07/2020 CLINICAL DATA:  Heart rate problem. EXAM: PORTABLE CHEST 1 VIEW COMPARISON:  June 07, 2020 (7:45 a.m.) FINDINGS: There is stable endotracheal tube, nasogastric tube and right internal jugular Swan-Ganz catheter positioning. A pacing lead is again seen likely entering via a femoral approach. Its distal tip projects just below the expected region of the diaphragm, along the midline. Mild, stable atelectasis and/or infiltrate is seen within the retrocardiac region of the left lung base. Very mild stable linear atelectasis is also  seen within the right lung base. A very small, stable left pleural effusion is seen. No pneumothorax is identified. The cardiac silhouette is mildly enlarged and unchanged in size. The visualized skeletal structures are unremarkable. IMPRESSION: 1. Stable line positioning with stable left basilar atelectasis and/or infiltrate. 2. Very small, stable left pleural effusion. Electronically Signed   By:  Virgina Norfolk M.D.   On: 06/07/2020 17:00   DG CHEST PORT 1 VIEW  Result Date: 06/07/2020 CLINICAL DATA:  Respiratory failure EXAM: PORTABLE CHEST 1 VIEW COMPARISON:  06/06/2020 FINDINGS: Cardiac shadow is enlarged but stable. Swan-Ganz catheter, endotracheal tube and gastric catheter are noted in satisfactory position. The overall inspiratory effort is poor with slight increase in left-sided pleural effusion. Mild bibasilar atelectasis is noted. IMPRESSION: Slight increase in left-sided pleural effusion. Mild bibasilar atelectasis. Tubes and lines as described above. Electronically Signed   By: Inez Catalina M.D.   On: 06/07/2020 08:17   DG CHEST PORT 1 VIEW  Result Date: 06/06/2020 CLINICAL DATA:  Intubation. EXAM: PORTABLE CHEST 1 VIEW COMPARISON:  June 06, 2020 FINDINGS: The endotracheal tube terminates above the carina by approximately 3 cm. The Swan-Ganz catheter tip projects over the main right pulmonary artery. The enteric tube appears to extend below the left hemidiaphragm. There is a femoral approach line projecting over the right ventricle which may represent a pacing lead. There are bilateral pleural effusions which have increased from prior study. The heart size is stable. Aortic calcifications are noted. There is no pneumothorax. IMPRESSION: 1. Lines and tubes as above. 2. Worsening bilateral pleural effusions. 3. No pneumothorax. The cardiac silhouette is stable in size from prior study. Electronically Signed   By: Constance Holster M.D.   On: 06/06/2020 19:28   DG Abd Portable 1V  Result Date: 06/06/2020 CLINICAL DATA:  Abdominal distension EXAM: PORTABLE ABDOMEN - 1 VIEW COMPARISON:  06/04/2020 FINDINGS: Partially visualized enteric tube is coiled within the stomach. Right femoral central venous catheter extends beyond the superior margin of the film. Nonobstructive bowel gas pattern. No gross free intraperitoneal air. IMPRESSION: Nonobstructive bowel gas pattern.  Electronically Signed   By: Davina Poke D.O.   On: 06/06/2020 08:34    Cardiac Studies   November 24 TEE personally reviewed shows no evidence of a left ventricular thrombus and stable severely reduced left ventricular function.    Assessment & Plan  Mr. Abood is a 64 year old gentleman with a history of coronary artery disease who presented with electrical storm.  His ventricular tachycardia was symptomatic but did not cause hemodynamic collapse.  The VT was refractory to amiodarone and lidocaine.  Ultimately he required sedation and intubation to help manage his electrical instability.  Also with administration of antiarrhythmics he had profound bradycardia which she has had previously thought to be due to to his severe native right coronary artery disease and disease within the vein graft to the right.  He is now being managed in the intensive care unit.  His course has been complicated by pneumonia which is being treated with vancomycin and cefepime.  He is overall improving and has remained electrically quiet while in the intensive care unit.  This morning, physical exam is notable for the patient to be sedated but responsive to tactile stimuli.    His pulmonary exam is significantly improved with clear breath sounds down to bilateral bases.  The amount of secretions has decreased since my last exam.  He is not pacing.  Review of telemetry shows sinus rhythm ranging 40s  to 18s.  TEE on November 24 showed stable left ventricular function and no evidence for left ventricular thrombus.  Heparin was stopped.  1.  Ventricular tachycardia Electrically stable on amiodarone. We will continue amiodarone by IV for now. He will require implantation of a dual-chamber single coil ICD prior to discharge.  2.  Sinus node dysfunction Seems stable at the moment.  Has not required consistent pacing from his temporary transvenous wire.    3.  Acute hypoxic respiratory failure Remains intubated.  He  is requiring minimal ventilatory support.  Secretions have improved. Continue aggressive pulmonary toilet. Continue antibiotics. Wean sedation as tolerated to move towards extubation. Appreciate the care from the critical care team.     CRITICAL CARE Performed by: Vickie Epley   Total critical care time: 40 minutes  Critical care time was exclusive of separately billable procedures and treating other patients.  Critical care was necessary to treat or prevent imminent or life-threatening deterioration.  Critical care was time spent personally by me on the following activities: development of treatment plan with patient and/or surrogate as well as nursing, discussions with consultants, evaluation of patient's response to treatment, examination of patient, obtaining history from patient or surrogate, ordering and performing treatments and interventions, ordering and review of laboratory studies, ordering and review of radiographic studies, pulse oximetry and re-evaluation of patient's condition.   For questions or updates, please contact [place] Please consult [email or web address] for contact info under Cardiology/STEMI.      Signed, Lars Mage, MD  06/08/2020, 7:19 AM

## 2020-06-09 DIAGNOSIS — I472 Ventricular tachycardia: Secondary | ICD-10-CM | POA: Diagnosis not present

## 2020-06-09 DIAGNOSIS — J9601 Acute respiratory failure with hypoxia: Secondary | ICD-10-CM | POA: Diagnosis not present

## 2020-06-09 DIAGNOSIS — R570 Cardiogenic shock: Secondary | ICD-10-CM | POA: Diagnosis not present

## 2020-06-09 LAB — CBC
HCT: 37.2 % — ABNORMAL LOW (ref 39.0–52.0)
Hemoglobin: 12.5 g/dL — ABNORMAL LOW (ref 13.0–17.0)
MCH: 31.3 pg (ref 26.0–34.0)
MCHC: 33.6 g/dL (ref 30.0–36.0)
MCV: 93.2 fL (ref 80.0–100.0)
Platelets: 167 10*3/uL (ref 150–400)
RBC: 3.99 MIL/uL — ABNORMAL LOW (ref 4.22–5.81)
RDW: 13.3 % (ref 11.5–15.5)
WBC: 9.3 10*3/uL (ref 4.0–10.5)
nRBC: 0 % (ref 0.0–0.2)

## 2020-06-09 LAB — T4, FREE: Free T4: 0.84 ng/dL (ref 0.61–1.12)

## 2020-06-09 LAB — BASIC METABOLIC PANEL
Anion gap: 11 (ref 5–15)
BUN: 19 mg/dL (ref 8–23)
CO2: 20 mmol/L — ABNORMAL LOW (ref 22–32)
Calcium: 7.8 mg/dL — ABNORMAL LOW (ref 8.9–10.3)
Chloride: 104 mmol/L (ref 98–111)
Creatinine, Ser: 1.3 mg/dL — ABNORMAL HIGH (ref 0.61–1.24)
GFR, Estimated: >60 mL/min (ref >60)
Glucose, Bld: 94 mg/dL (ref 70–99)
Potassium: 3.6 mmol/L (ref 3.5–5.1)
Sodium: 135 mmol/L (ref 135–145)

## 2020-06-09 LAB — COOXEMETRY PANEL
Carboxyhemoglobin: 0.8 % (ref 0.5–1.5)
Methemoglobin: 0.8 % (ref 0.0–1.5)
O2 Saturation: 66.1 %
Total hemoglobin: 13.1 g/dL (ref 12.0–16.0)

## 2020-06-09 LAB — TSH: TSH: 1.557 u[IU]/mL (ref 0.350–4.500)

## 2020-06-09 LAB — GLUCOSE, CAPILLARY
Glucose-Capillary: 88 mg/dL (ref 70–99)
Glucose-Capillary: 110 mg/dL — ABNORMAL HIGH (ref 70–99)

## 2020-06-09 LAB — MAGNESIUM: Magnesium: 2.3 mg/dL (ref 1.7–2.4)

## 2020-06-09 MED ORDER — FENTANYL 2500MCG IN NS 250ML (10MCG/ML) PREMIX INFUSION: 25.0000 ug/h | INTRAVENOUS | Status: DC

## 2020-06-09 MED ORDER — SODIUM CHLORIDE 0.9 % IV SOLN
2.0000 g | Freq: Three times a day (TID) | INTRAVENOUS | Status: AC
  Administered 2020-06-09 – 2020-06-13 (×12): 2 g via INTRAVENOUS
  Filled 2020-06-09 (×12): qty 2

## 2020-06-09 MED ORDER — FENTANYL BOLUS VIA INFUSION
25.0000 ug | INTRAVENOUS | Status: DC | PRN
  Filled 2020-06-09: qty 25

## 2020-06-09 MED ORDER — GUAIFENESIN 100 MG/5ML PO SOLN: 5.0000 mL | ORAL | Status: DC | PRN

## 2020-06-09 MED ORDER — FENTANYL CITRATE (PF) 100 MCG/2ML IJ SOLN: 25.0000 ug | Freq: Once | INTRAMUSCULAR | Status: DC

## 2020-06-09 MED ORDER — POTASSIUM CHLORIDE 20 MEQ PO PACK
40.0000 meq | PACK | Freq: Once | ORAL | Status: AC
  Administered 2020-06-09: 40 meq
  Filled 2020-06-09: qty 2

## 2020-06-09 NOTE — Progress Notes (Signed)
NAME:  [name], MRN:  [id number], DOB:  1956-06-09, LOS: 5 ADMISSION DATE:  06/04/2020, CONSULTATION DATE:  06/04/2020 REFERRING MD:  Dr. Haroldine Laws, CHIEF COMPLAINT:  VT cardiac arrest  Brief History   23 yoM with extensive cardiac history with recent STEMI 04/2020, with recurrent VT who developed severe bradycardia on amiodarone and lidocaine gtt requiring external pacing.  Intubated and emergently taken to cath lab for TVP, PA catheter, and R/LHC.  PCCM consulted for ventilator management.   History of present illness   HPI obtained from medical chart review as patient is sedated and intubated on mechanical ventilation.   64 year old male with history of CAD w/ previous CABG in 2014, chronic combined systolic and diastolic HF/ ICM, HTN, HLD, CVA, and recent inferior STEMI 05/03/20 secondary to thrombotic occlusion of the SVG to the RCA, likely due to missed ticagrelor doses.  EF noted to be 25% with moderate RV to severe RV dysfunction and possible LV thrombus 05/04/2020.  He was discharged home on 10/23 on a LifeVest, but apparently not using.    On 11/21, he developed dizziness at church.  EMS found patient in wide complex tachycardia treated with amiodarone and lidocaine with successful conversion to NSR.  On arrival to ER, patient had recurrence of his VT but hemodynamically tolerating.  He was given additional amiodarone and lidocaine gtt started, however developed severe bradycardia requiring atropine and external pacing.  He was intubated and taken emergently for temporary transvenous pacemaker and coronary angiogram.  Returns to ICU sedated and intubated, PCCM consulted for further ventilator management.   Past Medical History  CAD (s/p CABG in 2014 with LIMA-LAD, SVG-OM2 and SVG-RCA, s/p angioplasty to proximal SVG-RCA and DES to SVG-RCA anastomosisin 03/2019), chronic combined systolic and diastolic CHF/ICM (EF 46-96% by echo in 06/2019), HTN, HLD,prior CVA  Significant  Hospital Events   11/21 Admitted cardiology/ intubated/ L/RHC, TVP, PA catheter 11/22  Remains on NE, amio, heparin gtt, decreased UOP.  CVP 8, CI 1.34, co-ox 60-66%, started on milrinone 11/23 Tmax 101.7, w/ increased respiratory secretions prompted the initiation of antibiotics. co-ox 72%, CVP 12, CI 3.2, SVR 1090. Able to titrate off sedation and decrease NE; extubated 14:22 then re-intubated at 18:38.    Consults:  HF CCM EP  Procedures:  11/21 ETT >> 11/23; 11/23 >> 11/21 R IJ cordis with PA catheter >> 11/21 R femoral TVP >> 11/21 R radial aline >>  Significant Diagnostic Tests:  11/21 R/LHC >   Severe 3 vessel occlusive CAD. 2. Patent LIMA to the LAD 3. Patent SVG to OM1 4. Patent SVG to RCA. Much improved flow from prior PCI. 5. Mildly elevated LVEDP 17 mm Hg 6. Successful placement of temporary transvenous pacemaker.  Micro Data:  11/21 SARS2/ flu >>neg 11/21 MRSA PCR >>neg 11/23 BCx 2 >> 11/23 sputum >>  Antimicrobials:  11/23 cefepime >> 11/24 vanc >> 11/24  Interim history/subjective:  No reported events overnight Stable on telemetry with no recurrent VT Temporary pacemaker remains in place Alert and interactive on ventilator this morning  Objective   Blood pressure 91/62, pulse (!) 44, temperature 99.1 F (37.3 C), resp. rate (!) 0, height 5\' 7"  (1.702 m), weight 95.3 kg, SpO2 98 %. PAP: (29-44)/(12-26) 33/15 CVP:  [4 mmHg-12 mmHg] 7 mmHg CO:  [4.5 L/min-6 L/min] 4.5 L/min CI:  [2.2 L/min/m2-2.9 L/min/m2] 2.2 L/min/m2  Vent Mode: PRVC FiO2 (%):  [40 %-50 %] 40 % Set Rate:  [22 bmp] 22 bmp Vt Set:  [  520 mL] 520 mL PEEP:  [5 cmH20] [place] Pressure:  [14 cmH20-22 cmH20] 22 cmH20   Intake/Output Summary (Last 24 hours) at 06/09/2020 0906 Last data filed at 06/09/2020 0600 Gross per 24 hour  Intake 1663.11 ml  Output 760 ml  Net 903.11 ml   Filed Weights   06/07/20 0446 06/08/20 0500 06/09/20 0500  Weight: 92.2 kg 91.6 kg 95.3 kg    Examination: General: Acute on chronic ill-appearing elderly male lying in bed on mechanical ventilation in no acute distress  HEENT: ETT, MM pink/moist, PERRL,  Neuro: Alert and interactive on ventilator with cessation of sedation, able to follow commands CV: Sinus bradycardia s1s2 regular rate and rhythm, no murmur, rubs, or gallops, temporary pacemaker in place PULM: Clear to auscultation bilaterally, no increased work of breathing, tolerating ventilator well GI: soft, bowel sounds active in all 4 quadrants, non-tender, non-distended, tolerating TF Extremities: warm/dry, generalized nonpitting edema edema  Skin: no rashes or lesions  Resolved Hospital Problem list   Possible LV thrombus -Plt drop over last 5 days, concerning for HIT heparin drip stopped  -TEE 11/24 no LV thrombus   Thrombocytopenia - PLT 316-> 264-> 182-> 124 -> 167  Assessment & Plan:   Acute hypoxic respiratory failure Pleural effusions - failed extubation attempt 11/23 requiring reintubation P: Continue ventilator support with lung protective strategies Trial of SBT this morning, if tolerates and mentation allows trial of extubation Wean PEEP and FiO2 for sats greater than 90%. Head of bed elevated 30 degrees. Plateau pressures less than 30 cm H20.  Follow intermittent chest x-ray and ABG.   SAT/SBT as tolerated, mentation preclude extubation  Ensure adequate pulmonary hygiene  Follow cultures  VAP bundle in place  PAD protocol  Leukocytosis/ fever  - Possible pneumonia given secretions P: Continue cefepime for total of 7 days, stop date and place Follow culture data Trend WBC and fever curve Supportive care  Recurrent VT -Felt to be scar mediated.  Cardiogenic shock Chronic systolic/ diastolic HF with biventricular failure ICM  Sinus node dysfunction  CAD s/p CABG with recent inferior STEMI 04/2020 HTN -s/p emergent R/LHC with patent grafts, preserved output and compensated filling  pressures P: HF/EP/Cardiology following  Continue IV amiodarone, Milrinone, and Norepi Continuous telemetry  Plan for pacemaker placement per EP early next week  Pressor support for MAP goal > 65 Optimize electrolytes  Continue temporary pacemaker   AKI on CKD stage III -baseline sCr 1.3-1.4.  P: Follow renal function / urine output Trend Bmet Avoid nephrotoxins, ensure adequate renal perfusion  Continue foley Hemodynamic support as above  Best practice:  Diet: NPO; TF  Pain/Anxiety/Delirium protocol (if indicated): propofol/ fentanyl gtt VAP protocol (if indicated): yes DVT prophylaxis: heparin infusion GI prophylaxis: PPI Glucose control: add SSI if > 180 Mobility: BR Code Status: full  Family Communication: per primary  Disposition: ICU  Labs   CBC: Recent Labs  Lab 06/04/20 1148 06/04/20 1541 06/05/20 0334 06/05/20 0334 06/06/20 0450 06/06/20 0450 06/06/20 1800 06/06/20 2036 06/07/20 0408 06/08/20 0530 06/09/20 0331  WBC 6.5   < > 16.1*  --  16.3*  --   --   --  14.2* 11.8* 9.3  NEUTROABS 1.5*  --   --   --   --   --   --   --   --   --   --   HGB 17.3*   < > 15.0   < > 14.1   < > 13.3 13.9 12.6*  12.9* 12.5*  HCT 53.7*   < > 45.4   < > 43.8   < > 39.0 41.0 39.8 38.2* 37.2*  MCV 95.2   < > 93.2  --  95.4  --   --   --  97.3 93.6 93.2  PLT 297   < > 264  --  182  --   --   --  124* 152 167   < > = values in this interval not displayed.    Basic Metabolic Panel: Recent Labs  Lab 06/05/20 0334 06/05/20 0334 06/06/20 0450 06/06/20 0450 06/06/20 1800 06/06/20 2036 06/07/20 0408 06/08/20 0530 06/09/20 0331  NA 139   < > 137   < > 144 139 137 137 135  K 3.9   < > 4.1   < > 4.0 5.4* 4.0 3.6 3.6  CL 109  --  105  --   --   --  107 105 104  CO2 19*  --  19*  --   --   --  19* 21* 20*  GLUCOSE 124*  --  119*  --   --   --  109* 104* 94  BUN 20  --  16  --   --   --  18 18 19   CREATININE 1.45*  --  1.52*  --   --   --  1.52* 1.37* 1.30*  CALCIUM 7.9*   --  7.9*  --   --   --  8.0* 7.7* 7.8*  MG 1.6*  --  2.0  --   --   --  1.9 1.8 2.3   < > = values in this interval not displayed.   GFR: Estimated Creatinine Clearance: 61.5 mL/min (A) (by C-G formula based on SCr of 1.3 mg/dL (H)). Recent Labs  Lab 06/05/20 0334 06/05/20 0839 06/05/20 1234 06/06/20 0450 06/06/20 0835 06/06/20 1058 06/07/20 0408 06/08/20 0530 06/09/20 0331  PROCALCITON  --   --  0.22  --  0.21  --  0.31 0.53  --   WBC   < >  --   --  16.3*  --   --  14.2* 11.8* 9.3  LATICACIDVEN  --  1.0  --   --  1.3 0.9  --   --   --    < > = values in this interval not displayed.    Liver Function Tests: Recent Labs  Lab 06/04/20 1148  AST 26  ALT 22  ALKPHOS 66  BILITOT 1.1  PROT 7.3  ALBUMIN 3.9   No results for input(s): LIPASE, AMYLASE in the last 168 hours. No results for input(s): AMMONIA in the last 168 hours.  ABG    Component Value Date/Time   PHART 7.331 (L) 06/06/2020 2036   PCO2ART 45.2 06/06/2020 2036   PO2ART 179 (H) 06/06/2020 2036   HCO3 24.0 06/06/2020 2036   TCO2 25 06/06/2020 2036   ACIDBASEDEF 2.0 06/06/2020 2036   O2SAT 66.1 06/09/2020 0338     Coagulation Profile: No results for input(s): INR, PROTIME in the last 168 hours.  Cardiac Enzymes: No results for input(s): CKTOTAL, CKMB, CKMBINDEX, TROPONINI in the last 168 hours.  HbA1C: Hgb A1c MFr Bld  Date/Time Value Ref Range Status  05/03/2020 11:30 PM 6.1 (H) 4.8 - 5.6 % Final    Comment:    (NOTE)         Prediabetes: 5.7 - 6.4         Diabetes: >6.4  Glycemic control for adults with diabetes: <7.0   03/25/2019 02:39 AM 5.6 4.8 - 5.6 % Final    Comment:    (NOTE)         Prediabetes: 5.7 - 6.4         Diabetes: >6.4         Glycemic control for adults with diabetes: <7.0     CBG: Recent Labs  Lab 06/08/20 1131 06/08/20 1529 06/08/20 1927 06/08/20 2353 06/09/20 0337  GLUCAP 126* 99 89 83 88      Critical care time:    Performed by: Johnsie Cancel  Total critical care time: 40 minutes  Critical care time was exclusive of separately billable procedures and treating other patients.  Critical care was necessary to treat or prevent imminent or life-threatening deterioration.  Critical care was time spent personally by me on the following activities: development of treatment plan with patient and/or surrogate as well as nursing, discussions with consultants, evaluation of patient's response to treatment, examination of patient, obtaining history from patient or surrogate, ordering and performing treatments and interventions, ordering and review of laboratory studies, ordering and review of radiographic studies, pulse oximetry and re-evaluation of patient's condition.  Johnsie Cancel, NP-C [facility] Contact / Pager information can be found on Amion  06/09/2020, 9:26 AM

## 2020-06-09 NOTE — Progress Notes (Signed)
Advanced Heart Failure Rounding Note  PCP-Cardiologist: Sanda Klein, MD   Subjective:    Remains intubated. No further VT on IV amio.   TEE 11/24. EF 30-35% no evidence of LV clot.   On cefepime for PNA. Tmax 100.6 -> 99.3. WBC 14.2 -> 11.8 -> 9.3. Secretions improved.   Remains on NE 2 milrinone 0.125. Co-ox 66% Lasix held yesterday with low PCWP.   Swan #s CVP 7 PA 41/16 (24) PCW 11 Thermo 5.1/2.5  Objective:   Weight Range: 95.3 kg Body mass index is 32.91 kg/m.   Vital Signs:   Temp:  [98.4 F (36.9 C)-99.3 F (37.4 C)] 99.1 F (37.3 C) (11/26 0700) Pulse Rate:  [40-54] 44 (11/26 0700) Resp:  [0-22] 0 (11/26 0700) BP: (86-134)/(52-73) 91/62 (11/26 0700) SpO2:  [96 %-100 %] 98 % (11/26 0700) Arterial Line BP: (85-156)/(43-138) 89/43 (11/26 0700) FiO2 (%):  [40 %-50 %] 40 % (11/26 0400) Weight:  [95.3 kg] 95.3 kg (11/26 0500) Last BM Date: 06/08/20  Weight change: Filed Weights   06/07/20 0446 06/08/20 0500 06/09/20 0500  Weight: 92.2 kg 91.6 kg 95.3 kg    Intake/Output:   Intake/Output Summary (Last 24 hours) at 06/09/2020 0857 Last data filed at 06/09/2020 0600 Gross per 24 hour  Intake 1663.11 ml  Output 760 ml  Net 903.11 ml      Physical Exam    General:  Intubated. Awake on vent. Follows commands HEENT: normal + ETT Neck: supple. RIJ swan. Carotids 2+ bilat; no bruits. No lymphadenopathy or thryomegaly appreciated. Cor: PMI nondisplaced. Regular rate & rhythm. No rubs, gallops or murmurs. Lungs: RLL crackles Abdomen: soft, nontender, nondistended. No hepatosplenomegaly. No bruits or masses. Good bowel sounds. Extremities: no cyanosis, clubbing, rash, edema Neuro: awake on vent. Follows commands   Telemetry   Sinus brady 40s occasional PVCs. No VT. Pacer wires tested and working well. Personally reviewed   Labs    CBC Recent Labs    06/08/20 0530 06/09/20 0331  WBC 11.8* 9.3  HGB 12.9* 12.5*  HCT 38.2* 37.2*  MCV 93.6  93.2  PLT 152 299   Basic Metabolic Panel Recent Labs    06/08/20 0530 06/09/20 0331  NA 137 135  K 3.6 3.6  CL 105 104  CO2 21* 20*  GLUCOSE 104* 94  BUN 18 19  CREATININE 1.37* 1.30*  CALCIUM 7.7* 7.8*  MG 1.8 2.3   Liver Function Tests No results for input(s): AST, ALT, ALKPHOS, BILITOT, PROT, ALBUMIN in the last 72 hours. No results for input(s): LIPASE, AMYLASE in the last 72 hours. Cardiac Enzymes No results for input(s): CKTOTAL, CKMB, CKMBINDEX, TROPONINI in the last 72 hours.  BNP: BNP (last 3 results) Recent Labs    05/03/20 2330 05/04/20 0536  BNP 1,169.8* 1,408.4*    ProBNP (last 3 results) No results for input(s): PROBNP in the last 8760 hours.   D-Dimer No results for input(s): DDIMER in the last 72 hours. Hemoglobin A1C No results for input(s): HGBA1C in the last 72 hours. Fasting Lipid Panel Recent Labs    06/08/20 0530  TRIG 190*   Thyroid Function Tests No results for input(s): TSH, T4TOTAL, T3FREE, THYROIDAB in the last 72 hours.  Invalid input(s): FREET3  Other results:   Imaging    No results found.   Medications:     Scheduled Medications: . acetylcysteine  1 mL Nebulization TID  . aspirin  81 mg Per Tube Daily  . atorvastatin  80 mg  Per Tube q1800  . chlorhexidine gluconate (MEDLINE KIT)  15 mL Mouth Rinse BID  . Chlorhexidine Gluconate Cloth  6 each Topical Daily  . Chlorhexidine Gluconate Cloth  6 each Topical Daily  . clopidogrel  75 mg Per Tube Daily  . docusate  100 mg Per Tube BID  . enoxaparin (LOVENOX) injection  40 mg Subcutaneous Q24H  . escitalopram  10 mg Per Tube Daily  . ezetimibe  10 mg Per Tube Daily  . fentaNYL (SUBLIMAZE) injection  25 mcg Intravenous Once  . mouth rinse  15 mL Mouth Rinse 10 times per day  . pantoprazole (PROTONIX) IV  40 mg Intravenous Daily  . polyethylene glycol  17 g Per Tube Daily  . sodium chloride flush  10-40 mL Intracatheter Q12H  . sodium chloride flush  3 mL  Intravenous Q12H    Infusions: . sodium chloride 10 mL/hr at 06/09/20 0600  . sodium chloride 10 mL/hr at 06/09/20 0600  . amiodarone 30 mg/hr (06/09/20 0840)  . ceFEPime (MAXIPIME) IV Stopped (06/08/20 2258)  . fentaNYL infusion INTRAVENOUS 75 mcg/hr (06/09/20 0600)  . milrinone 0.125 mcg/kg/min (06/09/20 0600)  . norepinephrine (LEVOPHED) Adult infusion 2 mcg/min (06/09/20 0600)  . propofol (DIPRIVAN) infusion 15 mcg/kg/min (06/09/20 0839)    PRN Medications: sodium chloride, sodium chloride, acetaminophen (TYLENOL) oral liquid 160 mg/5 mL, albuterol, fentaNYL, guaiFENesin, nitroGLYCERIN, ondansetron (ZOFRAN) IV, sodium chloride flush, sodium chloride flush    Patient Profile   [name] a 64 y.o.malewith aPMH of CAD (s/p CABG in 2014 with LIMA-LAD, SVG-OM2 and SVG-RCA, s/p angioplasty to proximal SVG-RCA and DES to SVG-RCA anastomosisin 03/2019), chronic combined systolic and diastolic CHF/ICM (EF 94-70% by echo in 06/2019), HTN, HLD,prior CVAand recent STEMIwith angioplasty of the distal bypass graft10/21/21 d/c home with Life Vest.  Presented in VT requiring multiple amio bolus and lido but developed asystole.  Underwent external pacing and atropine then sent to Cath lab.      Assessment/Plan   1. VT arrest -> asystole - emergent cath 06/04/20 with stable coronary anatomy - suspect scar mediated VT - VT quiescent. Will continue IV amio until off inotropes/pressors - EP following. will need dual chamber ICD prior to d/c - Keep K > 4.0 Mg > 2.0. Supp K today  2. Acute on Chronic systolic HF with biventricular failure -> cardiogenic shock  - Echo 05/04/20 EF 25%. Moderate RV to severe RV dysfunction and possible LV thrombus. LifeVest placed. D/c'd home on 05/06/20 - RHC on admit with preserved output and well compensated filling pressures.  - Echo remains unchanged from October 2021, EF 25-30%, mod LVH, global hypokineses, aortic root dilatation 3.9 and ascending  aorta 4.0 - TEE 11/24 EF 30-35% - Currently on Levo 2 and milrinone 0.125 - Swan numbers look good. - Continue inotropes for support until extubated - Volume ok. No need for lasix today - Resume GDMT as able.   3. CAD  - s/p CABG  - Inferior STEMI 05/03/20 s/p POBA SVG->PDA - Emergent cath 06/04/20 with stable coronary anatomy - C/w DAPT/statin - No s/s angina  4. Possible LV thrombus on echo 05/04/20 - Repeat echo apical filling defect suspicious for LV thrombus vs false tendon -TEE 11/24 no LV thrombus - off AC  5. Acute hypoxic respiratory failure - intubated in setting of cardiac arrest - Extubated 11/23 at 14:22, required bipap then re-intubated 11/23 at 18:38 - CCM consulted to help manage vent and sedation, recommendations and assistance appreciated.  - Wean vent  as tolerated. Optimize with treatment of PNA and diuresis. Continue inotropes to support output. Main issue now is PNA and secretions. Hopefully can extubate today or tomorrow.   6.  LL PNA (HCAP) - COVID, Flu negative; lactic acid 1.0 -> 1.3 -> 0.9 - PCT 0.22 -> 0.21 ->  0.31 -> 0.53 - Cultures normal flora  - Remains on cefepime (vanc stopped)   7.  AKI -baseline Cr on previous admission around 1.3 - Foley exchanged 11/23 d/t clot causing obstruction -Admission Cr 1.78, today Cr 1.30 -monitor I&Os and BMP  8.  Thrombocytopenia - Resolved  CRITICAL CARE Performed by: Glori Bickers  Total critical care time: 35 minutes  Critical care time was exclusive of separately billable procedures and treating other patients.  Critical care was necessary to treat or prevent imminent or life-threatening deterioration.  Critical care was time spent personally by me (independent of midlevel providers or residents) on the following activities: development of treatment plan with patient and/or surrogate as well as nursing, discussions with consultants, evaluation of patient's response to treatment, examination  of patient, obtaining history from patient or surrogate, ordering and performing treatments and interventions, ordering and review of laboratory studies, ordering and review of radiographic studies, pulse oximetry and re-evaluation of patient's condition.    Length of Stay: [place], MD  06/09/2020, 8:57 AM  Advanced Heart Failure Team Pager [phone] (M-F; 7a - 4p)  Please contact [facility] for night-coverage after hours (4p -7a ) and weekends on [email or web address]

## 2020-06-09 NOTE — Progress Notes (Signed)
Progress Note  Patient Name: [name] Date of Encounter: 06/09/2020  Primary Cardiologist: Sanda Klein, MD   Subjective   No acute events overnight.  Secretions continue to improve.  Remains hemodynamically stable on minimal vasopressor support.  Today, efforts to move towards extubation have been limited to poor respiratory effort.  Sedation is currently off.  Inpatient Medications    Scheduled Meds: . acetylcysteine  1 mL Nebulization TID  . aspirin  81 mg Per Tube Daily  . atorvastatin  80 mg Per Tube q1800  . chlorhexidine gluconate (MEDLINE KIT)  15 mL Mouth Rinse BID  . Chlorhexidine Gluconate Cloth  6 each Topical Daily  . Chlorhexidine Gluconate Cloth  6 each Topical Daily  . clopidogrel  75 mg Per Tube Daily  . docusate  100 mg Per Tube BID  . enoxaparin (LOVENOX) injection  40 mg Subcutaneous Q24H  . escitalopram  10 mg Per Tube Daily  . ezetimibe  10 mg Per Tube Daily  . fentaNYL (SUBLIMAZE) injection  25 mcg Intravenous Once  . mouth rinse  15 mL Mouth Rinse 10 times per day  . pantoprazole (PROTONIX) IV  40 mg Intravenous Daily  . polyethylene glycol  17 g Per Tube Daily  . sodium chloride flush  10-40 mL Intracatheter Q12H  . sodium chloride flush  3 mL Intravenous Q12H   Continuous Infusions: . sodium chloride 10 mL/hr at 06/09/20 0900  . sodium chloride 10 mL/hr at 06/09/20 0900  . amiodarone 30 mg/hr (06/09/20 0900)  . ceFEPime (MAXIPIME) IV 2 g (06/09/20 1127)  . fentaNYL infusion INTRAVENOUS Stopped (06/09/20 1306)  . milrinone 0.125 mcg/kg/min (06/09/20 0900)  . norepinephrine (LEVOPHED) Adult infusion Stopped (06/09/20 0821)   PRN Meds: sodium chloride, sodium chloride, acetaminophen (TYLENOL) oral liquid 160 mg/5 mL, albuterol, fentaNYL, guaiFENesin, nitroGLYCERIN, ondansetron (ZOFRAN) IV, sodium chloride flush, sodium chloride flush   Vital Signs    Vitals:   06/09/20 1200 06/09/20 1300 06/09/20 1305 06/09/20 1318  BP: 108/69  117/74    Pulse: (!) 58 (!) 54  (!) 54  Resp: (!) 21 (!) 22  (!) 22  Temp: 98.2 F (36.8 C) 98.1 F (36.7 C)    TempSrc:      SpO2: 93% 93% 94% 94%  Weight:      Height:        Intake/Output Summary (Last 24 hours) at 06/09/2020 1409 Last data filed at 06/09/2020 1210 Gross per 24 hour  Intake 1842.74 ml  Output 635 ml  Net 1207.74 ml   Filed Weights   06/07/20 0446 06/08/20 0500 06/09/20 0500  Weight: 92.2 kg 91.6 kg 95.3 kg    Telemetry    Sinus rhythm 40s to 60s.  No ventricular tachycardia.- Personally Reviewed  ECG    No new- Personally Reviewed  Physical Exam   GEN:  Intubated.  Awake. Neck: Right internal jugular catheter Cardiac:  Warm extremities.  Regular rhythm. Respiratory:  Intubated.  Improved secretions. GI: Soft MS: No edema; No deformity. Neuro:   Awake Psych: Intubated, awake.  Labs    Chemistry Recent Labs  Lab 06/04/20 1148 06/04/20 1541 06/07/20 0408 06/08/20 0530 06/09/20 0331  NA 139   < > 137 137 135  K 4.2   < > 4.0 3.6 3.6  CL 104   < > 107 105 104  CO2 22   < > 19* 21* 20*  GLUCOSE 143*   < > 109* 104* 94  BUN 20   < >  _0 CREATININE 1.78*   < > 1.52* 1.37* 1.30*  CALCIUM 9.0   < > 8.0* 7.7* 7.8*  PROT 7.3  --   --   --   --   ALBUMIN 3.9  --   --   --   --   AST 26  --   --   --   --   ALT 22  --   --   --   --   ALKPHOS 66  --   --   --   --   BILITOT 1.1  --   --   --   --   GFRNONAA 42*   < > 50* 57* >60  ANIONGAP 13   < > _1 < > = values in this interval not displayed.     Hematology Recent Labs  Lab 06/07/20 0408 06/08/20 0530 06/09/20 0331  WBC 14.2* 11.8* 9.3  RBC 4.09* 4.08* 3.99*  HGB 12.6* 12.9* 12.5*  HCT 39.8 38.2* 37.2*  MCV 97.3 93.6 93.2  MCH 30.8 31.6 31.3  MCHC 31.7 33.8 33.6  RDW 13.0 13.1 13.3  PLT 124* 152 167    Cardiac EnzymesNo results for input(s): TROPONINI in the last 168 hours. No results for input(s): TROPIPOC in the last 168 hours.   BNPNo results for  input(s): BNP, PROBNP in the last 168 hours.   DDimer No results for input(s): DDIMER in the last 168 hours.   Radiology    DG CHEST PORT 1 VIEW  Result Date: 06/07/2020 CLINICAL DATA:  Heart rate problem. EXAM: PORTABLE CHEST 1 VIEW COMPARISON:  June 07, 2020 (7:45 a.m.) FINDINGS: There is stable endotracheal tube, nasogastric tube and right internal jugular Swan-Ganz catheter positioning. A pacing lead is again seen likely entering via a femoral approach. Its distal tip projects just below the expected region of the diaphragm, along the midline. Mild, stable atelectasis and/or infiltrate is seen within the retrocardiac region of the left lung base. Very mild stable linear atelectasis is also seen within the right lung base. A very small, stable left pleural effusion is seen. No pneumothorax is identified. The cardiac silhouette is mildly enlarged and unchanged in size. The visualized skeletal structures are unremarkable. IMPRESSION: 1. Stable line positioning with stable left basilar atelectasis and/or infiltrate. 2. Very small, stable left pleural effusion. Electronically Signed   By: Virgina Norfolk M.D.   On: 06/07/2020 17:00    Cardiac Studies   No new    Assessment & Plan    Mr. Firkus is a 64 year old gentleman with a history of coronary artery disease who presented with electrical storm.  His ventricular tachycardia was symptomatic but did not cause hemodynamic collapse.  The VT was refractory to amiodarone and lidocaine.  Ultimately he required sedation and intubation to help manage his electrical instability.  Also with administration of antiarrhythmics he had profound bradycardia which she has had previously thought to be due to to his severe native right coronary artery disease and disease within the vein graft to the right.  He is now being managed in the intensive care unit.  His course has been complicated by pneumonia which is being treated with vancomycin and cefepime.  He  is overall improving and has remained electrically quiet while in the intensive care unit.  Today, physical exam is notable for improved secretions.  Efforts to extubate have been limited by poor respiratory effort.He is not pacing.  Review of telemetry shows  sinus rhythm ranging 40s to 60s.  TEE on November 24 showed stable left ventricular function and no evidence for left ventricular thrombus.  Heparin has been stopped.  1. Ventricular tachycardia Electrically stable on amiodarone. We will continue amiodarone by IV for now. He will require implantation of a dual-chamber single coil ICD prior to discharge.  This would not be until next week.  2. Sinus node dysfunction Seems stable at the moment. Has not required consistent pacing from his temporary transvenous wire.   3. Acute hypoxic respiratory failure Remains intubated. He is requiring minimal ventilatory support. Secretions have improved. Continue aggressive pulmonary toilet. Continue antibiotics. Wean sedation as tolerated to move towards extubation. Appreciate the care from the critical care team.  For questions or updates, please contact [place] Please consult [email or web address] for contact info under Cardiology/STEMI.      Signed, Lars Mage, MD  06/09/2020, 2:09 PM

## 2020-06-09 NOTE — Progress Notes (Signed)
RT unable to perform CPT on pt d/t vomiting. Pt appears to vomit when he starts coughing. Pt respiratory status is stable on 4 Lpm [place] w/no distress noted at this time. RT will continue to monitor.

## 2020-06-09 NOTE — Progress Notes (Signed)
RT unable to do CPT on pt d/t vomiting. Pt taken off of BIPAP Servo I for same reason and placed on [place] 4 Lpm. Pt RN notified of vomiting.Pt respiratory status stable w/no distress noted at this time. RT will continue to monitor.

## 2020-06-09 NOTE — Progress Notes (Signed)
Pharmacy Antibiotic Note  [name] is a 64 y.o. male admitted on 06/04/2020 with dizziness/VT.  Pharmacy has been consulted for Cefepime dosing.   WBC has been WNL, Scr 1.3, afebrile. Day #3 today. Trach aspirate normal flora, BCx ngtd.  Plan: Cefepime 2g IV q8h w/ renal fx improving Trend WBC, temp, renal function  F/U infectious work-up Plan for 7 days of therapy   Height: 5\' 7"  (170.2 cm) Weight: 95.3 kg (210 lb 1.6 oz) IBW/kg (Calculated) : 66.1  Temp (24hrs), Avg:99 F (37.2 C), Min:98.4 F (36.9 C), Max:99.3 F (37.4 C)  Recent Labs  Lab 06/05/20 0334 06/05/20 0839 06/06/20 0450 06/06/20 0835 06/06/20 1058 06/07/20 0408 06/08/20 0530 06/09/20 0331  WBC 16.1*  --  16.3*  --   --  14.2* 11.8* 9.3  CREATININE 1.45*  --  1.52*  --   --  1.52* 1.37* 1.30*  LATICACIDVEN  --  1.0  --  1.3 0.9  --   --   --     Estimated Creatinine Clearance: 61.5 mL/min (A) (by C-G formula based on SCr of 1.3 mg/dL (H)).    No Known Allergies  Antonietta Jewel, PharmD, BCCCP Clinical Pharmacist  Phone: [phone] 06/09/2020 9:54 AM  Please check AMION for all [place] phone numbers After 10:00 PM, call [place] [phone]

## 2020-06-10 DIAGNOSIS — R570 Cardiogenic shock: Secondary | ICD-10-CM | POA: Diagnosis not present

## 2020-06-10 DIAGNOSIS — I472 Ventricular tachycardia: Secondary | ICD-10-CM | POA: Diagnosis not present

## 2020-06-10 DIAGNOSIS — J9601 Acute respiratory failure with hypoxia: Secondary | ICD-10-CM | POA: Diagnosis not present

## 2020-06-10 LAB — BASIC METABOLIC PANEL
Anion gap: 12 (ref 5–15)
Anion gap: 9 (ref 5–15)
BUN: 20 mg/dL (ref 8–23)
BUN: 19 mg/dL (ref 8–23)
CO2: 20 mmol/L — ABNORMAL LOW (ref 22–32)
CO2: 22 mmol/L (ref 22–32)
Calcium: 8.1 mg/dL — ABNORMAL LOW (ref 8.9–10.3)
Calcium: 8.1 mg/dL — ABNORMAL LOW (ref 8.9–10.3)
Chloride: 106 mmol/L (ref 98–111)
Chloride: 107 mmol/L (ref 98–111)
Creatinine, Ser: 1.23 mg/dL (ref 0.61–1.24)
Creatinine, Ser: 1.27 mg/dL — ABNORMAL HIGH (ref 0.61–1.24)
GFR, Estimated: >60 mL/min (ref >60)
GFR, Estimated: >60 mL/min (ref >60)
Glucose, Bld: 106 mg/dL — ABNORMAL HIGH (ref 70–99)
Glucose, Bld: 106 mg/dL — ABNORMAL HIGH (ref 70–99)
Potassium: 4.2 mmol/L (ref 3.5–5.1)
Potassium: 4.1 mmol/L (ref 3.5–5.1)
Sodium: 138 mmol/L (ref 135–145)
Sodium: 138 mmol/L (ref 135–145)

## 2020-06-10 LAB — CULTURE, BLOOD (ROUTINE X 2)
Culture: NO GROWTH
Culture: NO GROWTH
Special Requests: ADEQUATE

## 2020-06-10 LAB — GLUCOSE, CAPILLARY
Glucose-Capillary: 101 mg/dL — ABNORMAL HIGH (ref 70–99)
Glucose-Capillary: 101 mg/dL — ABNORMAL HIGH (ref 70–99)
Glucose-Capillary: 103 mg/dL — ABNORMAL HIGH (ref 70–99)
Glucose-Capillary: 106 mg/dL — ABNORMAL HIGH (ref 70–99)
Glucose-Capillary: 106 mg/dL — ABNORMAL HIGH (ref 70–99)
Glucose-Capillary: 117 mg/dL — ABNORMAL HIGH (ref 70–99)
Glucose-Capillary: 119 mg/dL — ABNORMAL HIGH (ref 70–99)
Glucose-Capillary: 79 mg/dL (ref 70–99)
Glucose-Capillary: 96 mg/dL (ref 70–99)

## 2020-06-10 LAB — COOXEMETRY PANEL
Carboxyhemoglobin: 0.9 % (ref 0.5–1.5)
Methemoglobin: 0.9 % (ref 0.0–1.5)
O2 Saturation: 76.3 %
Total hemoglobin: 13.2 g/dL (ref 12.0–16.0)

## 2020-06-10 LAB — CBC
HCT: 40 % (ref 39.0–52.0)
Hemoglobin: 12.9 g/dL — ABNORMAL LOW (ref 13.0–17.0)
MCH: 30.5 pg (ref 26.0–34.0)
MCHC: 32.3 g/dL (ref 30.0–36.0)
MCV: 94.6 fL (ref 80.0–100.0)
Platelets: 179 10*3/uL (ref 150–400)
RBC: 4.23 MIL/uL (ref 4.22–5.81)
RDW: 13 % (ref 11.5–15.5)
WBC: 11 10*3/uL — ABNORMAL HIGH (ref 4.0–10.5)
nRBC: 0 % (ref 0.0–0.2)

## 2020-06-10 LAB — MAGNESIUM: Magnesium: 2 mg/dL (ref 1.7–2.4)

## 2020-06-10 MED ORDER — CHLORHEXIDINE GLUCONATE 0.12 % MT SOLN
15.0000 mL | Freq: Two times a day (BID) | OROMUCOSAL | Status: DC
  Administered 2020-06-10 – 2020-06-25 (×25): 15 mL via OROMUCOSAL
  Filled 2020-06-10 (×25): qty 15

## 2020-06-10 MED ORDER — SACUBITRIL-VALSARTAN 24-26 MG PO TABS
1.0000 | ORAL_TABLET | Freq: Two times a day (BID) | ORAL | Status: DC
  Administered 2020-06-10 – 2020-06-11 (×4): 1 via ORAL
  Filled 2020-06-10 (×5): qty 1

## 2020-06-10 MED ORDER — ATORVASTATIN CALCIUM 80 MG PO TABS
80.0000 mg | ORAL_TABLET | Freq: Every day | ORAL | Status: DC
  Administered 2020-06-11: 80 mg via ORAL
  Filled 2020-06-10 (×2): qty 1

## 2020-06-10 MED ORDER — AMIODARONE HCL 200 MG PO TABS
400.0000 mg | ORAL_TABLET | Freq: Every day | ORAL | Status: DC
  Administered 2020-06-10: 400 mg via ORAL
  Filled 2020-06-10 (×2): qty 2

## 2020-06-10 MED ORDER — GUAIFENESIN 100 MG/5ML PO SOLN: 5.0000 mL | ORAL | Status: DC | PRN

## 2020-06-10 MED ORDER — ACETAMINOPHEN 160 MG/5ML PO SOLN: 650.0000 mg | ORAL | Status: DC | PRN

## 2020-06-10 MED ORDER — ASPIRIN 81 MG PO CHEW
81.0000 mg | CHEWABLE_TABLET | Freq: Every day | ORAL | Status: DC
  Administered 2020-06-11: 81 mg via ORAL
  Filled 2020-06-10: qty 1

## 2020-06-10 MED ORDER — POLYETHYLENE GLYCOL 3350 17 G PO PACK: 17.0000 g | PACK | Freq: Every day | ORAL | Status: DC

## 2020-06-10 MED ORDER — DOCUSATE SODIUM 50 MG/5ML PO LIQD
100.0000 mg | Freq: Two times a day (BID) | ORAL | Status: DC
  Administered 2020-06-10: 100 mg via ORAL
  Filled 2020-06-10 (×2): qty 10

## 2020-06-10 MED ORDER — ESCITALOPRAM OXALATE 10 MG PO TABS
10.0000 mg | ORAL_TABLET | Freq: Every day | ORAL | Status: DC
  Administered 2020-06-11: 10 mg via ORAL
  Filled 2020-06-10: qty 1

## 2020-06-10 MED ORDER — PROMETHAZINE HCL 25 MG/ML IJ SOLN
12.5000 mg | Freq: Three times a day (TID) | INTRAMUSCULAR | Status: DC | PRN
  Administered 2020-06-10 – 2020-06-11 (×3): 12.5 mg via INTRAVENOUS
  Filled 2020-06-10 (×3): qty 1

## 2020-06-10 MED ORDER — ORAL CARE MOUTH RINSE
15.0000 mL | Freq: Two times a day (BID) | OROMUCOSAL | Status: DC
  Administered 2020-06-10 – 2020-06-23 (×20): 15 mL via OROMUCOSAL

## 2020-06-10 MED ORDER — CLOPIDOGREL BISULFATE 75 MG PO TABS
75.0000 mg | ORAL_TABLET | Freq: Every day | ORAL | Status: DC
  Administered 2020-06-11: 75 mg via ORAL
  Filled 2020-06-10: qty 1

## 2020-06-10 MED ORDER — DIGOXIN 125 MCG PO TABS
0.1250 mg | ORAL_TABLET | Freq: Every day | ORAL | Status: DC
  Administered 2020-06-10 – 2020-06-11 (×2): 0.125 mg via ORAL
  Filled 2020-06-10 (×2): qty 1

## 2020-06-10 MED ORDER — EZETIMIBE 10 MG PO TABS
10.0000 mg | ORAL_TABLET | Freq: Every day | ORAL | Status: DC
  Administered 2020-06-11: 10 mg via ORAL
  Filled 2020-06-10: qty 1

## 2020-06-10 NOTE — Progress Notes (Addendum)
NAME:  [name], MRN:  [id number], DOB:  Apr 26, 1956, LOS: 6 ADMISSION DATE:  06/04/2020, CONSULTATION DATE:  06/04/2020 REFERRING MD:  Dr. Haroldine Laws, CHIEF COMPLAINT:  VT cardiac arrest  Brief History   16 yoM with extensive cardiac history with recent STEMI 04/2020, with recurrent VT who developed severe bradycardia on amiodarone and lidocaine gtt requiring external pacing.  Intubated and emergently taken to cath lab for TVP, PA catheter, and R/LHC.  PCCM consulted for ventilator management.   History of present illness   HPI obtained from medical chart review as patient is sedated and intubated on mechanical ventilation.   64 year old male with history of CAD w/ previous CABG in 2014, chronic combined systolic and diastolic HF/ ICM, HTN, HLD, CVA, and recent inferior STEMI 05/03/20 secondary to thrombotic occlusion of the SVG to the RCA, likely due to missed ticagrelor doses.  EF noted to be 25% with moderate RV to severe RV dysfunction and possible LV thrombus 05/04/2020.  He was discharged home on 10/23 on a LifeVest, but apparently not using.    On 11/21, he developed dizziness at church.  EMS found patient in wide complex tachycardia treated with amiodarone and lidocaine with successful conversion to NSR.  On arrival to ER, patient had recurrence of his VT but hemodynamically tolerating.  He was given additional amiodarone and lidocaine gtt started, however developed severe bradycardia requiring atropine and external pacing.  He was intubated and taken emergently for temporary transvenous pacemaker and coronary angiogram.  Returns to ICU sedated and intubated, PCCM consulted for further ventilator management.   Past Medical History  CAD (s/p CABG in 2014 with LIMA-LAD, SVG-OM2 and SVG-RCA, s/p angioplasty to proximal SVG-RCA and DES to SVG-RCA anastomosisin 03/2019), chronic combined systolic and diastolic CHF/ICM (EF 43-15% by echo in 06/2019), HTN, HLD,prior CVA  Significant  Hospital Events   11/21 Admitted cardiology/ intubated/ L/RHC, TVP, PA catheter 11/22  Remains on NE, amio, heparin gtt, decreased UOP.  CVP 8, CI 1.34, co-ox 60-66%, started on milrinone 11/23 Tmax 101.7, w/ increased respiratory secretions prompted the initiation of antibiotics. co-ox 72%, CVP 12, CI 3.2, SVR 1090. Able to titrate off sedation and decrease NE; extubated 14:22 then re-intubated at 18:38.    Consults:  HF CCM EP  Procedures:  11/21 ETT >> 11/23; 11/23 >> 11/21 R IJ cordis with PA catheter >> 11/21 R femoral TVP >> 11/21 R radial aline >>  Significant Diagnostic Tests:  11/21 R/LHC >   Severe 3 vessel occlusive CAD. 2. Patent LIMA to the LAD 3. Patent SVG to OM1 4. Patent SVG to RCA. Much improved flow from prior PCI. 5. Mildly elevated LVEDP 17 mm Hg 6. Successful placement of temporary transvenous pacemaker.  Micro Data:  11/21 SARS2/ flu >>neg 11/21 MRSA PCR >>neg 11/23 BCx 2 >> 11/23 sputum >>  Antimicrobials:  11/23 cefepime >> 11/24 vanc >> 11/24  Interim history/subjective:  Successfully extubated yesterday.  Pacing wire removed this morning. Patient somnolent and says he still feels drugged up from the sedation.  Objective   Blood pressure 124/78, pulse (!) 57, temperature 99.1 F (37.3 C), resp. rate (!) 24, height 5\' 7"  (1.702 m), weight 93.5 kg, SpO2 95 %. PAP: (30-55)/(13-26) 37/14 CVP:  [1 mmHg-14 mmHg] 1 mmHg CO:  [6 L/min-7.5 L/min] 6.2 L/min CI:  [2.9 L/min/m2-3.6 L/min/m2] 3 L/min/m2  Vent Mode: BIPAP FiO2 (%):  [40 %-100 %] 100 % Set Rate:  [8 bmp] 8 bmp PEEP:  [5 cmH20] 5  cmH20 Pressure Support:  [7 cmH20] 7 cmH20   Intake/Output Summary (Last 24 hours) at 06/10/2020 1535 Last data filed at 06/10/2020 1400 Gross per 24 hour  Intake 1493.43 ml  Output 765 ml  Net 728.43 ml   Filed Weights   06/08/20 0500 06/09/20 0500 06/10/20 0500  Weight: 91.6 kg 95.3 kg 93.5 kg   Examination: General: Acute on chronic ill-appearing elderly  male lying in bed  HEENT: Dry mucous membranes aphonic voice. Neuro: Somnolent but conversive in short sentences.  Moves all extremities weakly. CV: Sinus bradycardia s1s2 regular rate and rhythm, no murmur, rubs, or gallops,  PULM: Clear to auscultation bilaterally, GI: soft, bowel sounds active in all 4 quadrants, non-tender, non-distended, tolerating TF Extremities: warm/dry Skin: no rashes or lesions  Resolved Hospital Problem list   Possible LV thrombus-TEE negative Thrombocytopenia   Assessment & Plan:   Was critically ill due to acute hypoxic respiratory failure with small pleural effusion Now extubated -Continue to wean oxygen as tolerated -Incentive spirometry -Rest of ambulation per  Leukocytosis/ fever presumed HCAP -Complete 7 days of antibiotics.  Recurrent VT possibly scar or bradycardia mediated Underlying sinus node dysfunction  -Remove pacing wire -Transition to oral amiodarone -We will eventually need ICD.  Cardiogenic shock Chronic systolic/ diastolic HF with biventricular failure.  Now well compensated.  Blood pressure has improved considerably. -Milrinone discontinued -Started on Entresto  CAD s/p CABG with recent inferior STEMI 04/2020 HTN  AKI has resolved. -Follow volume status.  Best practice:  Diet: Clear diet.  Advance as tolerated tomorrow. Pain/Anxiety/Delirium protocol (if indicated): Off  VAP protocol (if indicated): No longer intubated.  Incentive spirometry DVT prophylaxis: heparin infusion GI prophylaxis: PPI Glucose control: add SSI if > 180 Mobility: Progressive ambulation, PT consult Code Status: full  Family Communication: per primary  Disposition: ICU  Labs   CBC: Recent Labs  Lab 06/04/20 1148 06/04/20 1541 06/06/20 0450 06/06/20 1800 06/06/20 2036 06/07/20 0408 06/08/20 0530 06/09/20 0331 06/10/20 0518  WBC 6.5   < > 16.3*  --   --  14.2* 11.8* 9.3 11.0*  NEUTROABS 1.5*  --   --   --   --   --   --   --   --    HGB 17.3*   < > 14.1   < > 13.9 12.6* 12.9* 12.5* 12.9*  HCT 53.7*   < > 43.8   < > 41.0 39.8 38.2* 37.2* 40.0  MCV 95.2   < > 95.4  --   --  97.3 93.6 93.2 94.6  PLT 297   < > 182  --   --  124* 152 167 179   < > = values in this interval not displayed.    Basic Metabolic Panel: Recent Labs  Lab 06/06/20 0450 06/06/20 1800 06/07/20 0408 06/08/20 0530 06/09/20 0331 06/10/20 0518 06/10/20 0945  NA 137   < > 137 137 135 138 138  K 4.1   < > 4.0 3.6 3.6 4.2 4.1  CL 105  --  107 105 104 106 107  CO2 19*  --  19* 21* 20* 20* 22  GLUCOSE 119*  --  109* 104* 94 106* 106*  BUN 16  --  18 18 19 20 19   CREATININE 1.52*  --  1.52* 1.37* 1.30* 1.23 1.27*  CALCIUM 7.9*  --  8.0* 7.7* 7.8* 8.1* 8.1*  MG 2.0  --  1.9 1.8 2.3 2.0  --    < > = values  in this interval not displayed.   GFR: Estimated Creatinine Clearance: 62.4 mL/min (A) (by C-G formula based on SCr of 1.27 mg/dL (H)). Recent Labs  Lab 06/05/20 0839 06/05/20 1234 06/06/20 0450 06/06/20 0835 06/06/20 1058 06/07/20 0408 06/08/20 0530 06/09/20 0331 06/10/20 0518  PROCALCITON  --  0.22  --  0.21  --  0.31 0.53  --   --   WBC  --   --    < >  --   --  14.2* 11.8* 9.3 11.0*  LATICACIDVEN 1.0  --   --  1.3 0.9  --   --   --   --    < > = values in this interval not displayed.    Liver Function Tests: Recent Labs  Lab 06/04/20 1148  AST 26  ALT 22  ALKPHOS 66  BILITOT 1.1  PROT 7.3  ALBUMIN 3.9   No results for input(s): LIPASE, AMYLASE in the last 168 hours. No results for input(s): AMMONIA in the last 168 hours.  ABG    Component Value Date/Time   PHART 7.331 (L) 06/06/2020 2036   PCO2ART 45.2 06/06/2020 2036   PO2ART 179 (H) 06/06/2020 2036   HCO3 24.0 06/06/2020 2036   TCO2 25 06/06/2020 2036   ACIDBASEDEF 2.0 06/06/2020 2036   O2SAT 76.3 06/10/2020 0518     Coagulation Profile: No results for input(s): INR, PROTIME in the last 168 hours.  Cardiac Enzymes: No results for input(s): CKTOTAL,  CKMB, CKMBINDEX, TROPONINI in the last 168 hours.  HbA1C: Hgb A1c MFr Bld  Date/Time Value Ref Range Status  05/03/2020 11:30 PM 6.1 (H) 4.8 - 5.6 % Final    Comment:    (NOTE)         Prediabetes: 5.7 - 6.4         Diabetes: >6.4         Glycemic control for adults with diabetes: <7.0   03/25/2019 02:39 AM 5.6 4.8 - 5.6 % Final    Comment:    (NOTE)         Prediabetes: 5.7 - 6.4         Diabetes: >6.4         Glycemic control for adults with diabetes: <7.0     CBG: Recent Labs  Lab 06/09/20 1531 06/09/20 2102 06/10/20 0041 06/10/20 0430 06/10/20 0806  GLUCAP 103* 110* 117* 101* 106*   Kipp Brood, MD [facility] ICU Physician [place]  Pager: [phone] Or Epic Secure Chat After hours: [phone].  06/10/2020, 3:45 PM

## 2020-06-10 NOTE — Progress Notes (Signed)
Patient ID: [name], male   DOB: 09/30/1955, 64 y.o.   MRN: [id number]     Advanced Heart Failure Rounding Note  PCP-Cardiologist: [name] Klein, MD   Subjective:    Extubated 11/26.  Has been coughing and gets nauseated, has had episodes of vomiting.   TEE 11/24: EF 30-35% no evidence of LV clot.   On cefepime for PNA.  Afebrile. WBC 14.2 -> 11.8 -> 9.3 -> 11.   Now off NE, remains on milrinone 0.125. Co-ox 76%.   No further VT and no bradycardia, remains on amiodarone 30 mg/hr.   [place]wan #s CVP 7 PA 43/19 Thermo CI 2.9  Objective:   Weight Range: 93.5 kg Body mass index is 32.28 kg/m.   Vital Signs:   Temp:  [97.5 F (36.4 C)-98.8 F (37.1 C)] 98.4 F (36.9 C) (11/27 0700) Pulse Rate:  [47-78] 61 (11/27 0700) Resp:  [19-27] 24 (11/27 0700) BP: (103-158)/(68-93) 138/81 (11/27 0700) SpO2:  [91 %-100 %] 92 % (11/27 0700) Arterial Line BP: (76-192)/(52-92) 157/71 (11/27 0700) FiO2 (%):  [40 %-100 %] 100 % (11/26 2000) Weight:  [93.5 kg] 93.5 kg (11/27 0500) Last BM Date: 06/08/20  Weight change: Filed Weights   06/08/20 0500 06/09/20 0500 06/10/20 0500  Weight: 91.6 kg 95.3 kg 93.5 kg    Intake/Output:   Intake/Output Summary (Last 24 hours) at 06/10/2020 0909 Last data filed at 06/10/2020 0700 Gross per 24 hour  Intake 1164.66 ml  Output 715 ml  Net 449.66 ml      Physical Exam    General: NAD Neck: No JVD, no thyromegaly or thyroid nodule.  Lungs: Clear to auscultation bilaterally with normal respiratory effort. CV: Nondisplaced PMI.  Heart regular S1/S2, no S3/S4, 2/6 SEM RUSB.  No peripheral edema.   Abdomen: Soft, nontender, no hepatosplenomegaly, no distention.  Skin: Intact without lesions or rashes.  Neurologic: Alert and oriented x 3.  Psych: Normal affect. Extremities: No clubbing or cyanosis.  HEENT: Normal.    Telemetry   NSR 60s, personally reviewed. Personally reviewed   Labs    CBC Recent Labs    06/09/20 0331  06/10/20 0518  WBC 9.3 11.0*  HGB 12.5* 12.9*  HCT 37.2* 40.0  MCV 93.2 94.6  PLT 167 627   Basic Metabolic Panel Recent Labs    06/08/20 0530 06/09/20 0331  NA 137 135  K 3.6 3.6  CL 105 104  CO2 21* 20*  GLUCOSE 104* 94  BUN 18 19  CREATININE 1.37* 1.30*  CALCIUM 7.7* 7.8*  MG 1.8 2.3   Liver Function Tests No results for input(s): AST, ALT, ALKPHOS, BILITOT, PROT, ALBUMIN in the last 72 hours. No results for input(s): LIPASE, AMYLASE in the last 72 hours. Cardiac Enzymes No results for input(s): CKTOTAL, CKMB, CKMBINDEX, TROPONINI in the last 72 hours.  BNP: BNP (last 3 results) Recent Labs    05/03/20 2330 05/04/20 0536  BNP 1,169.8* 1,408.4*    ProBNP (last 3 results) No results for input(s): PROBNP in the last 8760 hours.   D-Dimer No results for input(s): DDIMER in the last 72 hours. Hemoglobin A1C No results for input(s): HGBA1C in the last 72 hours. Fasting Lipid Panel Recent Labs    06/08/20 0530  TRIG 190*   Thyroid Function Tests Recent Labs    06/09/20 1245  TSH 1.557    Other results:   Imaging    No results found.   Medications:     Scheduled Medications: . acetylcysteine  1 mL Nebulization TID  . aspirin  81 mg Per Tube Daily  . atorvastatin  80 mg Per Tube q1800  . chlorhexidine  15 mL Mouth Rinse BID  . Chlorhexidine Gluconate Cloth  6 each Topical Daily  . Chlorhexidine Gluconate Cloth  6 each Topical Daily  . clopidogrel  75 mg Per Tube Daily  . digoxin  0.125 mg Oral Daily  . docusate  100 mg Per Tube BID  . enoxaparin (LOVENOX) injection  40 mg Subcutaneous Q24H  . escitalopram  10 mg Per Tube Daily  . ezetimibe  10 mg Per Tube Daily  . fentaNYL (SUBLIMAZE) injection  25 mcg Intravenous Once  . mouth rinse  15 mL Mouth Rinse q12n4p  . pantoprazole (PROTONIX) IV  40 mg Intravenous Daily  . polyethylene glycol  17 g Per Tube Daily  . sacubitril-valsartan  1 tablet Oral BID  . sodium chloride flush  10-40 mL  Intracatheter Q12H  . sodium chloride flush  3 mL Intravenous Q12H    Infusions: . sodium chloride 10 mL/hr at 06/10/20 0700  . sodium chloride 10 mL/hr at 06/10/20 0700  . amiodarone 30 mg/hr (06/10/20 0700)  . ceFEPime (MAXIPIME) IV Stopped (06/10/20 0322)  . fentaNYL infusion INTRAVENOUS Stopped (06/09/20 1306)  . norepinephrine (LEVOPHED) Adult infusion Stopped (06/09/20 0821)    PRN Medications: sodium chloride, sodium chloride, acetaminophen (TYLENOL) oral liquid 160 mg/5 mL, albuterol, fentaNYL, guaiFENesin, nitroGLYCERIN, ondansetron (ZOFRAN) IV, promethazine, sodium chloride flush, sodium chloride flush    Patient Profile   [name] a 64 y.o.malewith aPMH of CAD (s/p CABG in 2014 with LIMA-LAD, SVG-OM2 and SVG-RCA, s/p angioplasty to proximal SVG-RCA and DES to SVG-RCA anastomosisin 03/2019), chronic combined systolic and diastolic CHF/ICM (EF 03-49% by echo in 06/2019), HTN, HLD,prior CVAand recent STEMIwith angioplasty of the distal bypass graft10/21/21 d/c home with Life Vest.  Presented in VT requiring multiple amio bolus and lido but developed asystole.  Underwent external pacing and atropine then sent to Cath lab.      Assessment/Plan   1. VT arrest -> asystole - emergent cath 06/04/20 with stable coronary anatomy - suspect scar mediated VT - VT quiescent. Stopping milrinone.  Will continue amiodarone IV with nausea/vomiting, when stably taking po will transition to po.  - No pacing, discussed with EP ([name]) and will remove TTVP today as it has been in for several days.  - EP following. will need dual chamber ICD prior to d/c - Keep K > 4.0 Mg > 2.0. BMET pending.   2. Acute on Chronic systolic HF with biventricular failure -> cardiogenic shock  - Echo 05/04/20 EF 25%. Moderate RV to severe RV dysfunction and possible LV thrombus. LifeVest placed. D/c'd home on 05/06/20 - RHC on admit with preserved output and well compensated filling pressures.   - Echo remains unchanged from October 2021, EF 25-30%, mod LVH, global hypokineses, aortic root dilatation 3.9 and ascending aorta 4.0 - TEE 11/24 EF 30-35% - Currently off NE and on milrinone 0.125. Good co-ox and thermo CI, stop milrinone today.  - Can add digoxin 0.125.  - BP high, will add Entresto 24/26 bid.  - Does not need diuretics today, CVP 7.   3. CAD  - s/p CABG  - Inferior STEMI 05/03/20 s/p POBA SVG->PDA - Emergent cath 06/04/20 with stable coronary anatomy - C/w DAPT/statin - No chest pain.   4. Possible LV thrombus on echo 05/04/20 - Repeat echo apical filling defect suspicious for LV thrombus vs  false tendon -TEE 11/24 no LV thrombus - off AC  5. Acute hypoxic respiratory failure - intubated in setting of cardiac arrest - Extubated 11/23 at 14:22, required bipap then re-intubated 11/23 at 18:38 - CCM consulted to help manage vent and sedation, recommendations and assistance appreciated.  - Extubated 11/26.  6.  LL PNA (HCAP) - COVID, Flu negative; lactic acid 1.0 -> 1.3 -> 0.9 - PCT 0.22 -> 0.21 ->  0.31 -> 0.53 - Cultures normal flora  - Remains on cefepime (vanc stopped)   7.  AKI -baseline Cr on previous admission around 1.3 - Foley exchanged 11/23 d/t clot causing obstruction -Admission Cr 1.78, yesterday Cr 1.30 -Pending BMET this morning, will ask nurse to resend.   8.  Thrombocytopenia - Resolved  9. Nausea/vomiting - Seems associated with coughing spasms.  - Use anti-emetics.  - Nurse may place NG tube if needed.   CRITICAL CARE Performed by: Loralie Champagne  Total critical care time: 35 minutes  Critical care time was exclusive of separately billable procedures and treating other patients.  Critical care was necessary to treat or prevent imminent or life-threatening deterioration.  Critical care was time spent personally by me (independent of midlevel providers or residents) on the following activities: development of treatment plan  with patient and/or surrogate as well as nursing, discussions with consultants, evaluation of patient's response to treatment, examination of patient, obtaining history from patient or surrogate, ordering and performing treatments and interventions, ordering and review of laboratory studies, ordering and review of radiographic studies, pulse oximetry and re-evaluation of patient's condition.    Length of Stay: 6  Loralie Champagne, MD  06/10/2020, 9:09 AM  Advanced Heart Failure Team Pager [phone] (M-F; 7a - 4p)  Please contact [facility] for night-coverage after hours (4p -7a ) and weekends on [email or web address]

## 2020-06-10 NOTE — Evaluation (Signed)
Clinical/Bedside Swallow Evaluation Patient Details  Name: [name] MRN: [id number] Date of Birth: 09/21/1955  Today's Date: 06/10/2020 Time: SLP Start Time (ACUTE ONLY): 0843 SLP Stop Time (ACUTE ONLY): 0910 SLP Time Calculation (min) (ACUTE ONLY): 27 min  Past Medical History:  Past Medical History:  Diagnosis Date  . Borderline diabetic   . Coronary artery disease    a. s/p CABG in 2014 with LIMA-LAD, SVG-OM2 and SVG-RC b. 03/2019: STEMI and s/p angioplasty to proximal SVG-RCA and DES to SVG-RCA anastomosis c. 04/2020: recurrent STEMI with angioplasty of SVG-RCA  . Dyslipidemia 01/11/2013  . Hiatal hernia   . Hypertension   . Mini stroke ([place])    2  . S/P CABG x 3   . STEMI (ST elevation myocardial infarction) ([place])   . Stroke [facility])    Past Surgical History:  Past Surgical History:  Procedure Laterality Date  . CARDIAC CATHETERIZATION    . CORONARY ARTERY BYPASS GRAFT N/A 09/01/2012   Procedure: CORONARY ARTERY BYPASS GRAFTING (CABG);  Surgeon: Gaye Pollack, MD;  Location: [place];  Service: Open Heart Surgery;  Laterality: N/A;  Coronary Artery Bypass Grafting Times Three Using Left Internal Mammary Artery and Left Saphenous leg Vein Harvested Endoscopically  . CORONARY STENT INTERVENTION N/A 03/23/2019   Procedure: CORONARY STENT INTERVENTION;  Surgeon: Lorretta Harp, MD;  Location: [place] CV LAB;  Service: Cardiovascular;  Laterality: N/A;  . CORONARY/GRAFT ACUTE MI REVASCULARIZATION N/A 03/23/2019   Procedure: Coronary/Graft Acute MI Revascularization;  Surgeon: Lorretta Harp, MD;  Location: [place] CV LAB;  Service: Cardiovascular;  Laterality: N/A;  . CORONARY/GRAFT ACUTE MI REVASCULARIZATION N/A 05/03/2020   Procedure: Coronary/Graft Acute MI Revascularization;  Surgeon: Belva Crome, MD;  Location: [place] CV LAB;  Service: Cardiovascular;  Laterality: N/A;  . IABP INSERTION N/A 06/04/2020   Procedure: IABP Insertion;  Surgeon: Martinique, Peter M,  MD;  Location: [place] CV LAB;  Service: Cardiovascular;  Laterality: N/A;  . INTRAOPERATIVE TRANSESOPHAGEAL ECHOCARDIOGRAM N/A 09/01/2012   Procedure: INTRAOPERATIVE TRANSESOPHAGEAL ECHOCARDIOGRAM;  Surgeon: Gaye Pollack, MD;  Location: [place] OR;  Service: Open Heart Surgery;  Laterality: N/A;  . LEFT HEART CATH AND CORONARY ANGIOGRAPHY N/A 03/23/2019   Procedure: LEFT HEART CATH AND CORONARY ANGIOGRAPHY;  Surgeon: Lorretta Harp, MD;  Location: [place] CV LAB;  Service: Cardiovascular;  Laterality: N/A;  . LEFT HEART CATH AND CORONARY ANGIOGRAPHY N/A 05/03/2020   Procedure: LEFT HEART CATH AND CORONARY ANGIOGRAPHY;  Surgeon: Belva Crome, MD;  Location: [place] CV LAB;  Service: Cardiovascular;  Laterality: N/A;  . LEFT HEART CATHETERIZATION WITH CORONARY ANGIOGRAM N/A 08/31/2012   Procedure: LEFT HEART CATHETERIZATION WITH CORONARY ANGIOGRAM;  Surgeon: Peter M Martinique, MD;  Location: [facility] CATH LAB;  Service: Cardiovascular;  Laterality: N/A;  . PERCUTANEOUS CORONARY STENT INTERVENTION (PCI-S) N/A 08/31/2012   Procedure: PERCUTANEOUS CORONARY STENT INTERVENTION (PCI-S);  Surgeon: Peter M Martinique, MD;  Location: [facility] CATH LAB;  Service: Cardiovascular;  Laterality: N/A;  . RIGHT/LEFT HEART CATH AND CORONARY/GRAFT ANGIOGRAPHY N/A 06/04/2020   Procedure: RIGHT/LEFT HEART CATH AND CORONARY/GRAFT ANGIOGRAPHY;  Surgeon: Martinique, Peter M, MD;  Location: [place] CV LAB;  Service: Cardiovascular;  Laterality: N/A;  . TEMPORARY PACEMAKER N/A 06/04/2020   Procedure: TEMPORARY PACEMAKER;  Surgeon: Martinique, Peter M, MD;  Location: [place] CV LAB;  Service: Cardiovascular;  Laterality: N/A;   HPI:  29 yoM with extensive cardiac history with recent STEMI 04/2020, D/Cd home on 10/23 with LIfevest, per  notes was not using. Admitted 11/21 VT arrest, Acute on Chronic systolic HF with biventricular failure ->cardiogenic shock .  Intubated and emergently taken to cath lab for temporary transvenous  pacemaker and coronary angiogram.  Extubated 11/23 then reintubated four hours later, extubated 11/26.     Assessment / Plan / Recommendation Clinical Impression  Pt presents with an acute dysphagia secondary to deconditioning and glottal insufficiency after five days of oral intubation.  Voice is weak/hoarse with low volume and inability to improve quality/loudness with cues.  Oral mechanism exam normal; oral mucosa in good condition.  Pt with sensitive gag reflex, and coughing last night was eliciting vomiting per notes. Today, he gags in response to oral suctioning, but he did not regurgitate during assessment despite presence of a weak cough.  He was able to tolerate ice chips and applesauce with active oral manipulation, a palpable swallow, no overt s/s of aspiration.  Small quantities of thin liquid elicited + s/s of aspiration. Given overall weakness and suspected non-protective cough, recommend continuing NPO for today except to allow ice chips for pleasure and critical meds given whole in applesauce.  Anticipate improvement next 48 hours- consider cortrak today if that window is too long.  SLP will follow. SLP Visit Diagnosis: Dysphagia, pharyngeal phase (R13.13)    Aspiration Risk  Moderate aspiration risk    Diet Recommendation   NPO except ice chips and critical meds whole in puree/applesauce  Medication Administration: Whole meds with puree    Other  Recommendations Oral Care Recommendations: Oral care QID;Oral care prior to ice chip/H20   Follow up Recommendations None      Frequency and Duration min 3x week  2 weeks       Prognosis Prognosis for Safe Diet Advancement: Good      Swallow Study   General Date of Onset: 06/04/20 HPI: [name] with extensive cardiac history with recent STEMI 04/2020, D/Cd home on 10/23 with LIfevest, per notes was not using. Admitted 11/21 with severe bradycardia.  Intubated and emergently taken to cath lab for temporary transvenous pacemaker  and coronary angiogram.  Extubated 11/23 then reintubated four hours later, extubated 11/26.   Type of Study: Bedside Swallow Evaluation Previous Swallow Assessment: no Diet Prior to this Study: NPO Temperature Spikes Noted: No Respiratory Status: Nasal cannula History of Recent Intubation: Yes Length of Intubations (days): 5 days (over two intubations) Date extubated: 06/09/20 Behavior/Cognition: Alert Oral Cavity Assessment: Within Functional Limits Oral Care Completed by SLP: Yes Oral Cavity - Dentition: Adequate natural dentition Self-Feeding Abilities: Total assist Patient Positioning: Upright in bed Baseline Vocal Quality: Hoarse;Low vocal intensity Volitional Cough: Weak Volitional Swallow: Able to elicit    Oral/Motor/Sensory Function Overall Oral Motor/Sensory Function: Within functional limits   Ice Chips Ice chips: Within functional limits   Thin Liquid Thin Liquid: Impaired Presentation: Spoon Pharyngeal  Phase Impairments: Cough - Immediate    Nectar Thick Nectar Thick Liquid: Not tested   Honey Thick Honey Thick Liquid: Not tested   Puree Puree: Within functional limits   Solid     Solid: Not tested      Juan Quam Laurice 06/10/2020,9:13 AM   Estill Bamberg L. Tivis Ringer, [place] Office number [phone] Pager [phone]

## 2020-06-11 ENCOUNTER — Encounter (HOSPITAL_COMMUNITY): Payer: Self-pay | Admitting: Cardiology

## 2020-06-11 ENCOUNTER — Inpatient Hospital Stay (HOSPITAL_COMMUNITY): Payer: Medicare HMO

## 2020-06-11 DIAGNOSIS — I5043 Acute on chronic combined systolic (congestive) and diastolic (congestive) heart failure: Secondary | ICD-10-CM

## 2020-06-11 DIAGNOSIS — J9601 Acute respiratory failure with hypoxia: Secondary | ICD-10-CM | POA: Diagnosis not present

## 2020-06-11 DIAGNOSIS — I472 Ventricular tachycardia: Secondary | ICD-10-CM | POA: Diagnosis not present

## 2020-06-11 LAB — CBC
HCT: 43.3 % (ref 39.0–52.0)
Hemoglobin: 13.7 g/dL (ref 13.0–17.0)
MCH: 29.9 pg (ref 26.0–34.0)
MCHC: 31.6 g/dL (ref 30.0–36.0)
MCV: 94.5 fL (ref 80.0–100.0)
Platelets: 222 10*3/uL (ref 150–400)
RBC: 4.58 MIL/uL (ref 4.22–5.81)
RDW: 12.9 % (ref 11.5–15.5)
WBC: 12.3 10*3/uL — ABNORMAL HIGH (ref 4.0–10.5)
nRBC: 0 % (ref 0.0–0.2)

## 2020-06-11 LAB — GLUCOSE, CAPILLARY
Glucose-Capillary: 90 mg/dL (ref 70–99)
Glucose-Capillary: 104 mg/dL — ABNORMAL HIGH (ref 70–99)
Glucose-Capillary: 110 mg/dL — ABNORMAL HIGH (ref 70–99)
Glucose-Capillary: 98 mg/dL (ref 70–99)
Glucose-Capillary: 101 mg/dL — ABNORMAL HIGH (ref 70–99)
Glucose-Capillary: 93 mg/dL (ref 70–99)

## 2020-06-11 LAB — COOXEMETRY PANEL
Carboxyhemoglobin: 1 % (ref 0.5–1.5)
Methemoglobin: 0.7 % (ref 0.0–1.5)
O2 Saturation: 61.3 %
Total hemoglobin: 14.2 g/dL (ref 12.0–16.0)

## 2020-06-11 LAB — BASIC METABOLIC PANEL
Anion gap: 10 (ref 5–15)
BUN: 17 mg/dL (ref 8–23)
CO2: 23 mmol/L (ref 22–32)
Calcium: 8.3 mg/dL — ABNORMAL LOW (ref 8.9–10.3)
Chloride: 106 mmol/L (ref 98–111)
Creatinine, Ser: 1.2 mg/dL (ref 0.61–1.24)
GFR, Estimated: >60 mL/min (ref >60)
Glucose, Bld: 106 mg/dL — ABNORMAL HIGH (ref 70–99)
Potassium: 4.1 mmol/L (ref 3.5–5.1)
Sodium: 139 mmol/L (ref 135–145)

## 2020-06-11 LAB — MAGNESIUM: Magnesium: 1.8 mg/dL (ref 1.7–2.4)

## 2020-06-11 LAB — TRIGLYCERIDES: Triglycerides: 229 mg/dL — ABNORMAL HIGH (ref <150)

## 2020-06-11 MED ORDER — MAGNESIUM SULFATE 2 GM/50ML IV SOLN
2.0000 g | Freq: Once | INTRAVENOUS | Status: AC
  Administered 2020-06-11: 2 g via INTRAVENOUS
  Filled 2020-06-11: qty 50

## 2020-06-11 MED ORDER — HALOPERIDOL LACTATE 5 MG/ML IJ SOLN
2.0000 mg | INTRAMUSCULAR | Status: DC | PRN
  Administered 2020-06-11 – 2020-06-13 (×2): 2 mg via INTRAVENOUS
  Filled 2020-06-11 (×2): qty 1

## 2020-06-11 MED ORDER — SPIRONOLACTONE 12.5 MG HALF TABLET
12.5000 mg | ORAL_TABLET | Freq: Every day | ORAL | Status: DC
  Administered 2020-06-11: 12.5 mg via ORAL
  Filled 2020-06-11: qty 1

## 2020-06-11 MED ORDER — PROMETHAZINE HCL 25 MG/ML IJ SOLN
6.2500 mg | Freq: Three times a day (TID) | INTRAMUSCULAR | Status: DC | PRN
  Administered 2020-06-11: 6.25 mg via INTRAVENOUS
  Filled 2020-06-11: qty 1

## 2020-06-11 MED ORDER — AMIODARONE HCL 200 MG PO TABS
200.0000 mg | ORAL_TABLET | Freq: Two times a day (BID) | ORAL | Status: DC
  Administered 2020-06-11 (×2): 200 mg via ORAL
  Filled 2020-06-11: qty 1

## 2020-06-11 MED ORDER — PROMETHAZINE HCL 25 MG/ML IJ SOLN: 12.5000 mg | Freq: Three times a day (TID) | INTRAMUSCULAR | Status: DC | PRN

## 2020-06-11 NOTE — Progress Notes (Signed)
Marcene Brawn RN aware of order to d/c CVC

## 2020-06-11 NOTE — Progress Notes (Addendum)
PT Cancellation Note  Patient Details Name: [name] MRN: [id number] DOB: 1955/09/13   Cancelled Treatment:    Reason Eval/Treat Not Completed: Patient not medically ready (pt remains with swan ganz in place)   Checked in again at 77 still with Swan in place   [place] 06/11/2020, 8:47 AM  [place] Pager: [phone] Office: [phone]

## 2020-06-11 NOTE — Progress Notes (Addendum)
Patient ID: [name], male   DOB: 09/17/1955, 64 y.o.   MRN: [id number]     Advanced Heart Failure Rounding Note  PCP-Cardiologist: Sanda Klein, MD   Subjective:    Extubated 11/26.  Still coughing and hoarse, did not pass swallow evaluation. Not able to get pills this morning.  Denies dyspnea or chest pain.   TEE 11/24: EF 30-35% no evidence of LV clot.   On cefepime for PNA.  Tm 99.9.   Now off NE and milrinone, co-ox 61%. SBP 150 currently.   No further VT and no bradycardia, off amiodarone gtt but not able to get po amiodarone this morning.   Swan #s CVP 5 PA 35/14 Thermo CI 3.1  Objective:   Weight Range: 90.1 kg Body mass index is 31.11 kg/m.   Vital Signs:   Temp:  [98.6 F (37 C)-99.9 F (37.7 C)] 99.3 F (37.4 C) (11/28 0700) Pulse Rate:  [55-77] 57 (11/28 0700) Resp:  [19-29] 21 (11/28 0700) BP: (111-160)/(73-127) 135/83 (11/28 0700) SpO2:  [90 %-96 %] 92 % (11/28 0700) Arterial Line BP: (125-162)/(59-83) 125/59 (11/27 1300) Weight:  [90.1 kg] 90.1 kg (11/28 0500) Last BM Date: 06/10/20  Weight change: Filed Weights   06/09/20 0500 06/10/20 0500 06/11/20 0500  Weight: 95.3 kg 93.5 kg 90.1 kg    Intake/Output:   Intake/Output Summary (Last 24 hours) at 06/11/2020 0833 Last data filed at 06/11/2020 0700 Gross per 24 hour  Intake 571.89 ml  Output 830 ml  Net -258.11 ml      Physical Exam    General: NAD Neck: No JVD, no thyromegaly or thyroid nodule.  Lungs: Clear to auscultation bilaterally with normal respiratory effort. CV: Nondisplaced PMI.  Heart regular S1/S2, no S3/S4, 2/6 SEM RUSB.  No peripheral edema.   Abdomen: Soft, nontender, no hepatosplenomegaly, no distention.  Skin: Intact without lesions or rashes.  Neurologic: Alert and oriented x 3.  Psych: Normal affect. Extremities: No clubbing or cyanosis.  HEENT: Normal.    Telemetry   NSR 60s with no bradycardia, personally reviewed.    Labs    CBC Recent Labs     06/10/20 0518 06/11/20 0331  WBC 11.0* 12.3*  HGB 12.9* 13.7  HCT 40.0 43.3  MCV 94.6 94.5  PLT 179 601   Basic Metabolic Panel Recent Labs    06/10/20 0518 06/10/20 0518 06/10/20 0945 06/11/20 0331  NA 138   < > 138 139  K 4.2   < > 4.1 4.1  CL 106   < > 107 106  CO2 20*   < > 22 23  GLUCOSE 106*   < > 106* 106*  BUN 20   < > 19 17  CREATININE 1.23   < > 1.27* 1.20  CALCIUM 8.1*   < > 8.1* 8.3*  MG 2.0  --   --  1.8   < > = values in this interval not displayed.   Liver Function Tests No results for input(s): AST, ALT, ALKPHOS, BILITOT, PROT, ALBUMIN in the last 72 hours. No results for input(s): LIPASE, AMYLASE in the last 72 hours. Cardiac Enzymes No results for input(s): CKTOTAL, CKMB, CKMBINDEX, TROPONINI in the last 72 hours.  BNP: BNP (last 3 results) Recent Labs    05/03/20 2330 05/04/20 0536  BNP 1,169.8* 1,408.4*    ProBNP (last 3 results) No results for input(s): PROBNP in the last 8760 hours.   D-Dimer No results for input(s): DDIMER in the last 72 hours. Hemoglobin  A1C No results for input(s): HGBA1C in the last 72 hours. Fasting Lipid Panel Recent Labs    06/11/20 0331  TRIG 229*   Thyroid Function Tests Recent Labs    06/09/20 1245  TSH 1.557    Other results:   Imaging    No results found.   Medications:     Scheduled Medications: . amiodarone  200 mg Oral BID  . amiodarone  400 mg Oral Daily  . aspirin  81 mg Oral Daily  . atorvastatin  80 mg Oral q1800  . chlorhexidine  15 mL Mouth Rinse BID  . Chlorhexidine Gluconate Cloth  6 each Topical Daily  . Chlorhexidine Gluconate Cloth  6 each Topical Daily  . clopidogrel  75 mg Oral Daily  . digoxin  0.125 mg Oral Daily  . docusate  100 mg Oral BID  . enoxaparin (LOVENOX) injection  40 mg Subcutaneous Q24H  . escitalopram  10 mg Oral Daily  . ezetimibe  10 mg Oral Daily  . mouth rinse  15 mL Mouth Rinse q12n4p  . polyethylene glycol  17 g Oral Daily  .  sacubitril-valsartan  1 tablet Oral BID  . sodium chloride flush  10-40 mL Intracatheter Q12H  . sodium chloride flush  3 mL Intravenous Q12H  . spironolactone  12.5 mg Oral Daily    Infusions: . sodium chloride Stopped (06/10/20 1010)  . sodium chloride Stopped (06/10/20 1516)  . ceFEPime (MAXIPIME) IV Stopped (06/11/20 0249)    PRN Medications: sodium chloride, sodium chloride, acetaminophen (TYLENOL) oral liquid 160 mg/5 mL, albuterol, guaiFENesin, nitroGLYCERIN, ondansetron (ZOFRAN) IV, promethazine, sodium chloride flush, sodium chloride flush    Patient Profile   [name] a 64 y.o.malewith aPMH of CAD (s/p CABG in 2014 with LIMA-LAD, SVG-OM2 and SVG-RCA, s/p angioplasty to proximal SVG-RCA and DES to SVG-RCA anastomosisin 03/2019), chronic combined systolic and diastolic CHF/ICM (EF 35-57% by echo in 06/2019), HTN, HLD,prior CVAand recent STEMIwith angioplasty of the distal bypass graft10/21/21 d/c home with Life Vest.  Presented in VT requiring multiple amio bolus and lido but developed asystole.  Underwent external pacing and atropine then sent to Cath lab.      Assessment/Plan   1. VT arrest -> asystole - emergent cath 06/04/20 with stable coronary anatomy - suspect scar mediated VT - VT quiescent.  - Will place NGT to get pills, will need amiodarone 200 mg bid.  - TTVP discontinued 11/27, no further bradycardia.  - EP following. will need dual chamber ICD prior to d/c - Keep K > 4.0 Mg > 2.0.   2. Acute on Chronic systolic HF with biventricular failure -> cardiogenic shock  - Echo 05/04/20 EF 25%. Moderate RV to severe RV dysfunction and possible LV thrombus. LifeVest placed. D/c'd home on 05/06/20 - RHC on admit with preserved output and well compensated filling pressures.  - Echo remains unchanged from October 2021, EF 25-30%, mod LVH, global hypokineses, aortic root dilatation 3.9 and ascending aorta 4.0 - TEE 11/24 EF 30-35% - Currently off NE and  milrinone. Good co-ox and thermo CI.  - Continue digoxin 0.125 and Entresto 24/26 bid, will need NGT for pills (nurse to place).  - Add spironolactone 12.5 daily.   - Does not need diuretics today, CVP 5.  - Can remove Swan, leave introducer.   3. CAD  - s/p CABG  - Inferior STEMI 05/03/20 s/p POBA SVG->PDA - Emergent cath 06/04/20 with stable coronary anatomy - C/w DAPT/statin, will need NGT placed this morning  for pills.  - No chest pain.   4. Possible LV thrombus on echo 05/04/20 - Repeat echo apical filling defect suspicious for LV thrombus vs false tendon -TEE 11/24 no LV thrombus - off AC  5. Acute hypoxic respiratory failure - intubated in setting of cardiac arrest - Extubated 11/23 at 14:22, required bipap then re-intubated 11/23 at 18:38 - CCM consulted to help manage vent and sedation, recommendations and assistance appreciated.  - Extubated 11/26.  6.  LL PNA (HCAP) - COVID, Flu negative; lactic acid 1.0 -> 1.3 -> 0.9 - PCT 0.22 -> 0.21 ->  0.31 -> 0.53 - Cultures normal flora  - Remains on cefepime (vanc stopped)   7.  AKI - baseline Cr on previous admission around 1.3 - Foley exchanged 11/23 d/t clot causing obstruction - Admission Cr 1.78, creatinine 1.2 today.   8.  Thrombocytopenia - Resolved  9. Dysphagia - Failed swallow evaluation yesterday.  - Hoarse/quiet voice, ?vocal cord damage.  - Will need NGT today for meds, nurse to place.     Length of Stay: 7  Loralie Champagne, MD  06/11/2020, 8:33 AM  Advanced Heart Failure Team Pager [phone] (M-F; 7a - 4p)  Please contact [facility] for night-coverage after hours (4p -7a ) and weekends on [email or web address]

## 2020-06-11 NOTE — Progress Notes (Signed)
  Speech Language Pathology Treatment: Dysphagia  Patient Details Name: [name] MRN: [id number] DOB: [birth year] Today's Date: 06/11/2020 Time: 5465-0354 SLP Time Calculation (min) (ACUTE ONLY): 14 min  Assessment / Plan / Recommendation Clinical Impression  Pt was seen for dysphagia treatment. He was alert and cooperative throughout the session. Voice remains impaired with reduced vocal intensity and hoarse vocal quality which together impacted speech intelligibility. Nursing reported that the pt has been demonstrating dry heaving and coughing with meds crushed in puree and that the plan is currently to place an NGT. Today he tolerated ice chips and thin liquids without overt s/sx of aspiration. However, he exhibited a strong cough with three boluses of puree and regurgitation/emesis was ultimately noted. Regurgitation/emesis may be related to the strength of pt's cough; however, potential esophageal involvement is also considered. Due pt's reduced tolerance of dysphagia 1 solids, SLP is in agreement with placement of NGT at this time for meds and nutrition. SLP will contiune to follow to assess readiness for a p.o. diet and for instrumental assessment if pt remains symptomatic.    HPI HPI: 52 yoM with extensive cardiac history with recent STEMI 04/2020, D/Cd home on 10/23 with LIfevest, per notes was not using. Admitted 11/21 with Admitted 11/21 VT arrest, Acute on Chronic systolic HF with biventricular failure ->cardiogenic shock . Intubated and emergently taken to cath lab for temporary transvenous pacemaker and coronary angiogram.  Extubated 11/23 then reintubated four hours later, extubated 11/26. [name] in place.        SLP Plan  Continue with current plan of care       Recommendations  Diet recommendations: NPO Medication Administration: Via alternative means                Oral Care Recommendations: Oral care QID;Oral care prior to ice chip/H20 Follow up  Recommendations: None SLP Visit Diagnosis: Dysphagia, pharyngeal phase (R13.13) Plan: Continue with current plan of care       Jamarkis Branam I. Hardin Negus, [place], [place] Office number [phone] Pager [place] 06/11/2020, 9:19 AM

## 2020-06-11 NOTE — Progress Notes (Signed)
SLP Cancellation Note  Patient Details Name: [name] MRN: [id number] DOB: Mar 11, 1956   Cancelled treatment:       Reason Eval/Treat Not Completed: Patient at procedure or test/unavailable (Pt working with PT. SLP will f/u)  [name], [place], [place] Office number [phone] Pager [place] 06/11/2020, 8:42 AM

## 2020-06-11 NOTE — Progress Notes (Signed)
NAME:  [name], MRN:  [id number], DOB:  [birth year], LOS: 7 ADMISSION DATE:  06/04/2020, CONSULTATION DATE:  06/04/2020 REFERRING MD:  Dr. Haroldine Laws, CHIEF COMPLAINT:  VT cardiac arrest  Brief History   64 yoM with extensive cardiac history with recent STEMI 04/2020, with recurrent VT who developed severe bradycardia on amiodarone and lidocaine gtt requiring external pacing.  Intubated and emergently taken to cath lab for TVP, PA catheter, and R/LHC.  PCCM consulted for ventilator management.   History of present illness   HPI obtained from medical chart review as patient is sedated and intubated on mechanical ventilation.   64 year old male with history of CAD w/ previous CABG in 2014, chronic combined systolic and diastolic HF/ ICM, HTN, HLD, CVA, and recent inferior STEMI 05/03/20 secondary to thrombotic occlusion of the SVG to the RCA, likely due to missed ticagrelor doses.  EF noted to be 25% with moderate RV to severe RV dysfunction and possible LV thrombus 05/04/2020.  He was discharged home on 10/23 on a LifeVest, but apparently not using.    On 11/21, he [place] dizziness at church.  EMS found patient in wide complex tachycardia treated with amiodarone and lidocaine with successful conversion to NSR.  On arrival to ER, patient had recurrence of his VT but hemodynamically tolerating.  He was given additional amiodarone and lidocaine gtt started, however developed severe bradycardia requiring atropine and external pacing.  He was intubated and taken emergently for temporary transvenous pacemaker and coronary angiogram.  Returns to ICU sedated and intubated, PCCM consulted for further ventilator management.   Past Medical History  CAD (s/p CABG in 2014 with LIMA-LAD, SVG-OM2 and SVG-RCA, s/p angioplasty to proximal SVG-RCA and DES to SVG-RCA anastomosisin 03/2019), chronic combined systolic and diastolic CHF/ICM (EF 79-02% by echo in 06/2019), HTN, HLD,prior CVA  Significant  Hospital Events   11/21 Admitted cardiology/ intubated/ L/RHC, TVP, PA catheter 11/22  Remains on NE, amio, heparin gtt, decreased UOP.  CVP 8, CI 1.34, co-ox 60-66%, started on milrinone 11/23 Tmax 101.7, w/ increased respiratory secretions prompted the initiation of antibiotics. co-ox 72%, CVP 12, CI 3.2, SVR 1090. Able to titrate off sedation and decrease NE; extubated 14:22 then re-intubated at 18:38.    Consults:  HF CCM EP  Procedures:  11/21 ETT >> 11/23; 11/23 >> 11/21 R IJ cordis with PA catheter >> 11/21 R femoral TVP >> 11/21 R radial aline >>  Significant Diagnostic Tests:  11/21 R/LHC >   Severe 3 vessel occlusive CAD. 2. Patent LIMA to the LAD 3. Patent SVG to OM1 4. Patent SVG to RCA. Much improved flow from prior PCI. 5. Mildly elevated LVEDP 17 mm Hg 6. Successful placement of temporary transvenous pacemaker.  Micro Data:  11/21 SARS2/ flu >>neg 11/21 MRSA PCR >>neg 11/23 BCx 2 >> 11/23 sputum >>  Antimicrobials:  11/23 cefepime >> 11/24 vanc >> 11/24  Interim history/subjective:  Remains extubated.  Responses remain slow.  Episode of vomiting with even minimal pharyngeal stimulation.  Objective   Blood pressure (!) 144/97, pulse 70, temperature 99.7 F (37.6 C), resp. rate (!) 25, height 5\' 7"  (1.702 m), weight 90.1 kg, SpO2 92 %. PAP: (20-54)/(3-47) 41/20 CVP:  [0 mmHg-20 mmHg] 20 mmHg CO:  [5.6 L/min-6.3 L/min] 6.3 L/min CI:  [2.8 L/min/m2-3.1 L/min/m2] 3.1 L/min/m2      Intake/Output Summary (Last 24 hours) at 06/11/2020 1443 Last data filed at 06/11/2020 0700 Gross per 24 hour  Intake 282.48 ml  Output 590  ml  Net -307.52 ml   Filed Weights   06/09/20 0500 06/10/20 0500 06/11/20 0500  Weight: 95.3 kg 93.5 kg 90.1 kg   Examination: General: Acute on chronic ill-appearing elderly male lying in bed  HEENT: Dry mucous membranes aphonic voice. Neuro: Somnolent but conversive in short sentences.  Moves all extremities weakly. CV: Sinus  bradycardia s1s2 regular rate and rhythm, no murmur, rubs, or gallops,  PULM: Clear to auscultation bilaterally, GI: soft, bowel sounds active in all 4 quadrants, non-tender, non-distended, tolerating TF Extremities: warm/dry Skin: no rashes or lesions  Resolved Hospital Problem list   Possible LV thrombus-TEE negative Thrombocytopenia   Assessment & Plan:   Was critically ill due to acute hypoxic respiratory failure with small pleural effusion Now extubated -Continue to wean oxygen as tolerated -Incentive spirometry -Progressive ambulation  Nausea secondary to exaggerated gag response secondary to stroke and recent intubation -SLP to evaluate -Nasogastric tube for medications.  May need a period of nasogastric feeding.  Leukocytosis/ fever presumed HCAP -Complete 7 days of antibiotics.  Recurrent VT possibly scar or bradycardia mediated Underlying sinus node dysfunction  -Remove pacing wire -Transition to oral amiodarone -We will eventually need ICD.  Cardiogenic shock Chronic systolic/ diastolic HF with biventricular failure.  Now well compensated.  Blood pressure has improved considerably. -Milrinone discontinued -Started on Entresto -Arterial line and Swan removed today.  CAD s/p CABG with recent inferior STEMI 04/2020 HTN  AKI has resolved. -Follow volume status.  Best practice:  Diet: Clear diet.  Advance as tolerated Pain/Anxiety/Delirium protocol (if indicated): Off.  Avoid sedatives given baseline lethargy. VAP protocol (if indicated): No longer intubated.  Incentive spirometry DVT prophylaxis: heparin infusion GI prophylaxis: PPI Glucose control: add SSI if > 180 Mobility: Progressive ambulation, PT consult Code Status: full  Family Communication: per primary  Disposition: ICU.  If remains off vasopressors may be ready for transfer to floor soon.  If respiratory status remains stable tomorrow, PCCM will sign off.  Labs   CBC: Recent Labs  Lab  06/07/20 0408 06/08/20 0530 06/09/20 0331 06/10/20 0518 06/11/20 0331  WBC 14.2* 11.8* 9.3 11.0* 12.3*  HGB 12.6* 12.9* 12.5* 12.9* 13.7  HCT 39.8 38.2* 37.2* 40.0 43.3  MCV 97.3 93.6 93.2 94.6 94.5  PLT 124* 152 167 179 742    Basic Metabolic Panel: Recent Labs  Lab 06/07/20 0408 06/07/20 0408 06/08/20 0530 06/09/20 0331 06/10/20 0518 06/10/20 0945 06/11/20 0331  NA 137   < > 137 135 138 138 139  K 4.0   < > 3.6 3.6 4.2 4.1 4.1  CL 107   < > 105 104 106 107 106  CO2 19*   < > 21* 20* 20* 22 23  GLUCOSE 109*   < > 104* 94 106* 106* 106*  BUN 18   < > 18 19 20 19 17   CREATININE 1.52*   < > 1.37* 1.30* 1.23 1.27* 1.20  CALCIUM 8.0*   < > 7.7* 7.8* 8.1* 8.1* 8.3*  MG 1.9  --  1.8 2.3 2.0  --  1.8   < > = values in this interval not displayed.   GFR: Estimated Creatinine Clearance: 64.8 mL/min (by C-G formula based on SCr of 1.2 mg/dL). Recent Labs  Lab 06/05/20 0839 06/05/20 1234 06/06/20 0450 06/06/20 0835 06/06/20 1058 06/07/20 0408 06/07/20 0408 06/08/20 0530 06/09/20 0331 06/10/20 0518 06/11/20 0331  PROCALCITON  --  0.22  --  0.21  --  0.31  --  0.53  --   --   --  WBC  --   --    < >  --   --  14.2*   < > 11.8* 9.3 11.0* 12.3*  LATICACIDVEN 1.0  --   --  1.3 0.9  --   --   --   --   --   --    < > = values in this interval not displayed.    Liver Function Tests: No results for input(s): AST, ALT, ALKPHOS, BILITOT, PROT, ALBUMIN in the last 168 hours. No results for input(s): LIPASE, AMYLASE in the last 168 hours. No results for input(s): AMMONIA in the last 168 hours.  ABG    Component Value Date/Time   PHART 7.331 (L) 06/06/2020 2036   PCO2ART 45.2 06/06/2020 2036   PO2ART 179 (H) 06/06/2020 2036   HCO3 24.0 06/06/2020 2036   TCO2 25 06/06/2020 2036   ACIDBASEDEF 2.0 06/06/2020 2036   O2SAT 61.3 06/11/2020 0331     Coagulation Profile: No results for input(s): INR, PROTIME in the last 168 hours.  Cardiac Enzymes: No results for  input(s): CKTOTAL, CKMB, CKMBINDEX, TROPONINI in the last 168 hours.  HbA1C: Hgb A1c MFr Bld  Date/Time Value Ref Range Status  05/03/2020 11:30 PM 6.1 (H) 4.8 - 5.6 % Final    Comment:    (NOTE)         Prediabetes: 5.7 - 6.4         Diabetes: >6.4         Glycemic control for adults with diabetes: <7.0   03/25/2019 02:39 AM 5.6 4.8 - 5.6 % Final    Comment:    (NOTE)         Prediabetes: 5.7 - 6.4         Diabetes: >6.4         Glycemic control for adults with diabetes: <7.0     CBG: Recent Labs  Lab 06/10/20 1931 06/10/20 2328 06/11/20 0344 06/11/20 0735 06/11/20 1135  GLUCAP 101* 106* 90 104* 110*   Kipp Brood, MD [facility] ICU Physician [place]  Pager: [phone] Or Epic Secure Chat After hours: [phone].  06/11/2020, 2:43 PM

## 2020-06-12 DIAGNOSIS — R5381 Other malaise: Secondary | ICD-10-CM | POA: Diagnosis not present

## 2020-06-12 DIAGNOSIS — J181 Lobar pneumonia, unspecified organism: Secondary | ICD-10-CM | POA: Diagnosis not present

## 2020-06-12 DIAGNOSIS — J9601 Acute respiratory failure with hypoxia: Secondary | ICD-10-CM | POA: Diagnosis not present

## 2020-06-12 DIAGNOSIS — R570 Cardiogenic shock: Secondary | ICD-10-CM | POA: Diagnosis not present

## 2020-06-12 DIAGNOSIS — I472 Ventricular tachycardia: Secondary | ICD-10-CM | POA: Diagnosis not present

## 2020-06-12 LAB — COOXEMETRY PANEL
Carboxyhemoglobin: 1.3 % (ref 0.5–1.5)
Methemoglobin: 0.9 % (ref 0.0–1.5)
O2 Saturation: 70.9 %
Total hemoglobin: 14.3 g/dL (ref 12.0–16.0)

## 2020-06-12 LAB — CBC
HCT: 42.3 % (ref 39.0–52.0)
Hemoglobin: 13.8 g/dL (ref 13.0–17.0)
MCH: 30.5 pg (ref 26.0–34.0)
MCHC: 32.6 g/dL (ref 30.0–36.0)
MCV: 93.6 fL (ref 80.0–100.0)
Platelets: 264 10*3/uL (ref 150–400)
RBC: 4.52 MIL/uL (ref 4.22–5.81)
RDW: 12.5 % (ref 11.5–15.5)
WBC: 14.3 10*3/uL — ABNORMAL HIGH (ref 4.0–10.5)
nRBC: 0 % (ref 0.0–0.2)

## 2020-06-12 LAB — BASIC METABOLIC PANEL
Anion gap: 8 (ref 5–15)
BUN: 19 mg/dL (ref 8–23)
CO2: 24 mmol/L (ref 22–32)
Calcium: 8 mg/dL — ABNORMAL LOW (ref 8.9–10.3)
Chloride: 107 mmol/L (ref 98–111)
Creatinine, Ser: 1.21 mg/dL (ref 0.61–1.24)
GFR, Estimated: >60 mL/min (ref >60)
Glucose, Bld: 94 mg/dL (ref 70–99)
Potassium: 3.5 mmol/L (ref 3.5–5.1)
Sodium: 139 mmol/L (ref 135–145)

## 2020-06-12 LAB — MAGNESIUM
Magnesium: 2.1 mg/dL (ref 1.7–2.4)
Magnesium: 1.9 mg/dL (ref 1.7–2.4)

## 2020-06-12 LAB — GLUCOSE, CAPILLARY
Glucose-Capillary: 110 mg/dL — ABNORMAL HIGH (ref 70–99)
Glucose-Capillary: 101 mg/dL — ABNORMAL HIGH (ref 70–99)
Glucose-Capillary: 97 mg/dL (ref 70–99)
Glucose-Capillary: 99 mg/dL (ref 70–99)
Glucose-Capillary: 101 mg/dL — ABNORMAL HIGH (ref 70–99)
Glucose-Capillary: 99 mg/dL (ref 70–99)
Glucose-Capillary: 103 mg/dL — ABNORMAL HIGH (ref 70–99)

## 2020-06-12 LAB — PHOSPHORUS: Phosphorus: 1.5 mg/dL — ABNORMAL LOW (ref 2.5–4.6)

## 2020-06-12 MED ORDER — CLOPIDOGREL BISULFATE 75 MG PO TABS
75.0000 mg | ORAL_TABLET | Freq: Every day | ORAL | Status: DC
  Administered 2020-06-12 – 2020-06-16 (×5): 75 mg
  Filled 2020-06-12 (×6): qty 1

## 2020-06-12 MED ORDER — GUAIFENESIN 100 MG/5ML PO SOLN
5.0000 mL | ORAL | Status: DC | PRN
  Administered 2020-06-12 – 2020-06-15 (×2): 100 mg
  Filled 2020-06-12 (×2): qty 5

## 2020-06-12 MED ORDER — POLYETHYLENE GLYCOL 3350 17 G PO PACK: 17.0000 g | PACK | Freq: Every day | ORAL | Status: DC

## 2020-06-12 MED ORDER — DOCUSATE SODIUM 50 MG/5ML PO LIQD: 100.0000 mg | Freq: Two times a day (BID) | ORAL | Status: DC

## 2020-06-12 MED ORDER — PROSOURCE TF PO LIQD
45.0000 mL | Freq: Two times a day (BID) | ORAL | Status: DC
  Administered 2020-06-12 – 2020-06-15 (×6): 45 mL
  Filled 2020-06-12 (×6): qty 45

## 2020-06-12 MED ORDER — SPIRONOLACTONE 12.5 MG HALF TABLET: 12.5000 mg | ORAL_TABLET | Freq: Every day | ORAL | Status: DC

## 2020-06-12 MED ORDER — EZETIMIBE 10 MG PO TABS
10.0000 mg | ORAL_TABLET | Freq: Every day | ORAL | Status: DC
  Administered 2020-06-12 – 2020-06-22 (×10): 10 mg
  Filled 2020-06-12 (×11): qty 1

## 2020-06-12 MED ORDER — ASPIRIN 81 MG PO CHEW
81.0000 mg | CHEWABLE_TABLET | Freq: Every day | ORAL | Status: DC
  Administered 2020-06-12 – 2020-06-16 (×5): 81 mg
  Filled 2020-06-12 (×6): qty 1

## 2020-06-12 MED ORDER — ACETAMINOPHEN 160 MG/5ML PO SOLN
650.0000 mg | ORAL | Status: DC | PRN
  Administered 2020-06-17: 650 mg
  Filled 2020-06-12 (×2): qty 20.3

## 2020-06-12 MED ORDER — AMIODARONE HCL 200 MG PO TABS
200.0000 mg | ORAL_TABLET | Freq: Two times a day (BID) | ORAL | Status: DC
  Administered 2020-06-12 – 2020-06-22 (×22): 200 mg
  Filled 2020-06-12 (×23): qty 1

## 2020-06-12 MED ORDER — SACUBITRIL-VALSARTAN 24-26 MG PO TABS
1.0000 | ORAL_TABLET | Freq: Two times a day (BID) | ORAL | Status: DC
  Filled 2020-06-12: qty 1

## 2020-06-12 MED ORDER — DIGOXIN 125 MCG PO TABS
0.1250 mg | ORAL_TABLET | Freq: Every day | ORAL | Status: DC
  Administered 2020-06-12 – 2020-06-22 (×11): 0.125 mg
  Filled 2020-06-12 (×12): qty 1

## 2020-06-12 MED ORDER — SPIRONOLACTONE 25 MG PO TABS
25.0000 mg | ORAL_TABLET | Freq: Every day | ORAL | Status: DC
  Administered 2020-06-12 – 2020-06-22 (×11): 25 mg
  Filled 2020-06-12 (×13): qty 1

## 2020-06-12 MED ORDER — POTASSIUM CHLORIDE 20 MEQ/15ML (10%) PO SOLN
40.0000 meq | Freq: Once | ORAL | Status: AC
  Administered 2020-06-12: 40 meq
  Filled 2020-06-12: qty 30

## 2020-06-12 MED ORDER — ATORVASTATIN CALCIUM 80 MG PO TABS
80.0000 mg | ORAL_TABLET | Freq: Every day | ORAL | Status: DC
  Administered 2020-06-12 – 2020-06-22 (×11): 80 mg
  Filled 2020-06-12 (×11): qty 1

## 2020-06-12 MED ORDER — ESCITALOPRAM OXALATE 10 MG PO TABS
10.0000 mg | ORAL_TABLET | Freq: Every day | ORAL | Status: DC
  Administered 2020-06-12 – 2020-06-22 (×10): 10 mg
  Filled 2020-06-12 (×12): qty 1

## 2020-06-12 MED ORDER — SACUBITRIL-VALSARTAN 49-51 MG PO TABS
1.0000 | ORAL_TABLET | Freq: Two times a day (BID) | ORAL | Status: DC
  Administered 2020-06-12 – 2020-06-13 (×4): 1
  Filled 2020-06-12 (×5): qty 1

## 2020-06-12 MED ORDER — VITAL 1.5 CAL PO LIQD
1000.0000 mL | ORAL | Status: DC
  Administered 2020-06-12 – 2020-06-14 (×2): 1000 mL
  Filled 2020-06-12 (×3): qty 1000

## 2020-06-12 NOTE — Progress Notes (Signed)
Nutrition Follow-up  DOCUMENTATION CODES:   Obesity unspecified  INTERVENTION:   Tube Feeding via NG/Cortrak: Vital 1.5 at 60 ml/hr Pro-Source 45 mL BID Provides 2240 kcals, 119 g of protein, 1094 mL of free water Meets 100% estimated calorie and and protein needs   NUTRITION DIAGNOSIS:   Inadequate oral intake related to inability to eat as evidenced by NPO status.  Being addressed via TF   GOAL:   Patient will meet greater than or equal to 90% of their needs  Progressing  MONITOR:   Vent status, Labs, Weight trends, I & O's, Diet advancement  REASON FOR ASSESSMENT:   Ventilator    ASSESSMENT:   64 year old male admitted with recurrent VT. Past medical history of CAD s/p CABG (2014), chronic combined CHF, ICM, HTN, HLD, CVA, and recent admission for STEMI (10/21) secondary to thrombotic occlusion of SVG to RCA, discharged 10/23 on LifeVest  11/21 Intubated 11/23 Extubated, Re-intubated 11/24 TEE EF 30-35% 11/26 Extubated  Noted plan for ICD prior to discharge  Plan to start TF Plan to exchange NG for Cortrak when able  NPO x 8 days, SLP following  Current wt 90 kg; admit weight 93 kg.   Labs: reviewed Meds: colace, miralax   Diet Order:   Diet Order            Diet NPO time specified Except for: Ice Chips  Diet effective now                 EDUCATION NEEDS:   No education needs have been identified at this time  Skin:  Skin Assessment: Reviewed RN Assessment  Last BM:  11/27  Height:   Ht Readings from Last 1 Encounters:  06/04/20 5\' 7"  (1.702 m)    Weight:   Wt Readings from Last 1 Encounters:  06/12/20 90.1 kg    BMI:  Body mass index is 31.11 kg/m.  Estimated Nutritional Needs:   Kcal:  2000-2200 kcals  Protein:  110-125 g  Fluid:  >/= 2 L   Kerman Passey MS, RDN, LDN, CNSC Registered Dietitian III Clinical Nutrition RD Pager and On-Call Pager Number Located in [place]

## 2020-06-12 NOTE — Progress Notes (Signed)
Progress Note  Patient Name: [name] Date of Encounter: 06/12/2020  [facility] Cardiologist: Sanda Klein, MD   Subjective   OOB to chair, very thirsty  Inpatient Medications    Scheduled Meds:  amiodarone  200 mg Per Tube BID   aspirin  81 mg Per Tube Daily   atorvastatin  80 mg Per Tube q1800   chlorhexidine  15 mL Mouth Rinse BID   Chlorhexidine Gluconate Cloth  6 each Topical Daily   Chlorhexidine Gluconate Cloth  6 each Topical Daily   clopidogrel  75 mg Per Tube Daily   digoxin  0.125 mg Per Tube Daily   docusate  100 mg Per Tube BID   enoxaparin (LOVENOX) injection  40 mg Subcutaneous Q24H   escitalopram  10 mg Per Tube Daily   ezetimibe  10 mg Per Tube Daily   mouth rinse  15 mL Mouth Rinse q12n4p   polyethylene glycol  17 g Per Tube Daily   potassium chloride  40 mEq Per Tube Once   sacubitril-valsartan  1 tablet Per Tube BID   sodium chloride flush  10-40 mL Intracatheter Q12H   sodium chloride flush  3 mL Intravenous Q12H   spironolactone  25 mg Per Tube Daily   Continuous Infusions:  sodium chloride Stopped (06/10/20 1010)   sodium chloride Stopped (06/10/20 1516)   ceFEPime (MAXIPIME) IV 2 g (06/12/20 0301)   PRN Meds: sodium chloride, sodium chloride, acetaminophen (TYLENOL) oral liquid 160 mg/5 mL, albuterol, guaiFENesin, haloperidol lactate, nitroGLYCERIN, ondansetron (ZOFRAN) IV, promethazine, sodium chloride flush, sodium chloride flush   Vital Signs    Vitals:   06/12/20 0500 06/12/20 0600 06/12/20 0700 06/12/20 0734  BP: (!) 153/92 (!) 143/101 (!) 143/97   Pulse: 70 68 68   Resp: (!) 22 (!) 21 (!) 22   Temp:    100.1 F (37.8 C)  TempSrc:    Axillary  SpO2: 90% 92% 94%   Weight: 90.1 kg     Height:        Intake/Output Summary (Last 24 hours) at 06/12/2020 0851 Last data filed at 06/12/2020 0600 Gross per 24 hour  Intake 440.6 ml  Output 550 ml  Net -109.4 ml   Last 3 Weights 06/12/2020  06/11/2020 06/10/2020  Weight (lbs) 198 lb 10.2 oz 198 lb 10.2 oz 206 lb 2.1 oz  Weight (kg) 90.1 kg 90.1 kg 93.5 kg      Telemetry    SR, generally 60's-70's- Personally Reviewed  ECG    No new EKGs - Personally Reviewed  Physical Exam   GEN: somewhat chronically ill appearing Neck: No JVD Cardiac: RRR, no murmurs, rubs, or gallops, feet/extrem much warmer then yesterday Respiratory: decreased at the bases, no wheezing GI: Soft, nontender  MS: trace edema; No deformity. Neuro:  AAO x3  Psych: very pleasent  Labs    High Sensitivity Troponin:   Recent Labs  Lab 06/04/20 1148  TROPONINIHS 157*      Chemistry Recent Labs  Lab 06/10/20 0945 06/11/20 0331 06/12/20 0409  NA 138 139 139  K 4.1 4.1 3.5  CL 107 106 107  CO2 22 23 24   GLUCOSE 106* 106* 94  BUN 19 17 19   CREATININE 1.27* 1.20 1.21  CALCIUM 8.1* 8.3* 8.0*  GFRNONAA >60 >60 >60  ANIONGAP 9 10 8      Hematology Recent Labs  Lab 06/10/20 0518 06/11/20 0331 06/12/20 0409  WBC 11.0* 12.3* 14.3*  RBC 4.23 4.58 4.52  HGB 12.9*  13.7 13.8  HCT 40.0 43.3 42.3  MCV 94.6 94.5 93.6  MCH 30.5 29.9 30.5  MCHC 32.3 31.6 32.6  RDW 13.0 12.9 12.5  PLT 179 222 264    BNPNo results for input(s): BNP, PROBNP in the last 168 hours.   DDimer No results for input(s): DDIMER in the last 168 hours.   Radiology    06/07/20: CXR  IMPRESSION: 1. Stable line positioning with stable left basilar atelectasis and/or infiltrate. 2. Very small, stable left pleural effusion.   Cardiac Studies   06/05/2020: TTE IMPRESSIONS  1. Technically difficult study with poor echo windows. Definity contrast  given.  2. Left ventricular ejection fraction, by estimation, is 25 to 30%. The  left ventricle has severely decreased function. The left ventricle  demonstrates global hypokinesis with regional variation including apical  dyskineisis. There is moderate left  ventricular hypertrophy. Left ventricular diastolic  parameters are  indeterminate. Linear filling defect at the apex, could be thrombus or  false tendon.  3. Right ventricular systolic function was not well visualized. The right  ventricular size is not well visualized.  4. The mitral valve was not well visualized. No evidence of mitral valve  regurgitation.  5. The aortic valve is tricuspid. Aortic valve regurgitation is trivial.  Mild aortic valve sclerosis is present, with no evidence of aortic valve  stenosis.  6. Aortic dilatation noted. There is borderline dilatation of the aortic  root, measuring 39 mm. There is mild dilatation of the ascending aorta,  measuring 40 mm.   Comparison(s): No significant change from prior study. 05/04/2020: LVEF  25-30%, 14 x 6 mm apical filing defect suspicious for LV thrombus. No  change in apical filling defect. Suspect false tendon, however, cannot  exclude thrombus given cardiomyopathy.    06/04/2020: R/LHC  Mid LAD lesion is 100% stenosed.  Prox Cx lesion is 100% stenosed.  Prox RCA lesion is 100% stenosed.  Dist RCA lesion is 20% stenosed.  Non-stenotic Origin to Prox Graft lesion was previously treated.  Prox Graft lesion is 30% stenosed.  LV end diastolic pressure is mildly elevated.   1. Severe 3 vessel occlusive CAD. 2. Patent LIMA to the LAD 3. Patent SVG to OM1 4. Patent SVG to RCA. Much improved flow from prior PCI. 5. Mildly elevated LVEDP 17 mm Hg 6. Successful placement of temporary transvenous pacemaker.  Plan: Dr Haroldine Laws placed a [name] catheter via the right internal jugular Vein. Will leave temporary transvenous pacemaker in place so that antiarrhythmic drugs can be used liberally.  Right heart cath demonstrates good cardiac output and right heart pressures so we deferred placing an IABP. Will need right femoral arterial sheath removed manually.  RHC Findings:  Ao = 174/111 (138)  LV = 169/16 RA =  7 RV = 41/6 PA = 37/20 (28) PCW = 14 Fick  cardiac output/index = 11.6/5.7 PVR = 1.2 WU FA sat = 99% PA sat = 85%, 86% SVC sat = 89%  High cardiac output with normal filling pressures. No evidence of intracardiac shunting.     05/04/2020: TTE IMPRESSIONS  1. Since the last study on 06/23/2019 LVEF has decreased from 35-40% to  25-30%, there is a suspicion for left ventricular apical thrombus  measuring 14 x 6 mm.  2. Left ventricular ejection fraction, by estimation, is 25 to 30%. The  left ventricle has severely decreased function. The left ventricle  demonstrates global hypokinesis. The left ventricular internal cavity size  was moderately dilated. Left  ventricular diastolic parameters are consistent with Grade I diastolic  dysfunction (impaired relaxation). Elevated left atrial pressure.  3. Right ventricular systolic function is moderately reduced. The right  ventricular size is moderately enlarged. There is mildly elevated  pulmonary artery systolic pressure. The estimated right ventricular  systolic pressure is 09.9 mmHg.  4. Left atrial size was moderately dilated.  5. Right atrial size was moderately dilated.  6. The mitral valve is normal in structure. Mild mitral valve  regurgitation. No evidence of mitral stenosis.  7. The aortic valve is normal in structure. Aortic valve regurgitation is  mild. No aortic stenosis is present.  8. The inferior vena cava is normal in size with greater than 50%  respiratory variability, suggesting right atrial pressure of 3 mmHg.    Patient Profile     64 y.o. male with aPMH of CAD (s/p CABG in 2014 with LIMA-LAD, SVG-OM2 and SVG-RCA, s/p angioplasty to proximal SVG-RCA and DES to SVG-RCA anastomosisin 03/2019), chronic combined systolic and diastolic CHF/ICM (EF 83-38% by echo in 06/2019), HTN, HLD,prior CVAand recent STEMIwith angioplasty of the distal bypass graft10/21/21  EMS [place] by EMS [place]/VT treated with amio bolus'  repeat bolus in ER >> asystole >> external pacing>> emergent cath/temp wire placement, intubated sedated.  Assessment & Plan   Dr. Quentin Ore has seen the patient this AM   . VT arrest -> asystole - emergent cath 06/04/20 with stable coronary anatomy - suspect scar mediated VT -  amio has been transitioned to PO (per tube)    Had some lido in the ER only - will planICD prior to d/c, once closer to d/c, perhaps in the next day or two - Keep K > 4.0 Mg > 2.0 - no bradycardia, temp wire is out (11/27)  2. Chronic systolic HF with biventricular failure -> cardiogenic shock  - Echo 05/04/20 EF 25%. Moderate RV to severe RV dysfunction and possible LV thrombus. LifeVest placed. D/c'd home on 05/06/20 - RHC on admit with preserved output and well compensated filling pressures.  Off NE and milrinone, co-ox 71% On dig, entresto (titrate up today), aldactone  3. CAD  - s/p CABG  - Inferior STEMI 05/03/20 s/p POBA SVG->PDA - Emergent cath 06/04/20 with stable coronary anatomy - on DAPT/statin/zetia  4. Possible LV thrombus on echo 05/04/20 TEE  This admission with no thrombus Off heparin gtt last week   5. Acute hypoxic respiratory failure - intubated in setting of cardiac arrest Extubated 11/23 Intubated 11/23 Extubated 11/26 Failed swallow, NGT for meds Will defer nutrition to CCM/AHF team  6. fever      99-100      BC x2 are neg for 5 days      Pneumonia (HCAP)      WBC >> up a bit again 14     C/w CCM   7. AKI/CKD     Renal function remains stable       For questions or updates, please contact [place] Please consult [email or web address] for contact info under        Signed, Baldwin Jamaica, PA-C  06/12/2020, 8:51 AM

## 2020-06-12 NOTE — Progress Notes (Signed)
SLP Cancellation Note  Patient Details Name: [name] MRN: [id number] DOB: [birth year]   Cancelled treatment:       Reason Eval/Treat Not Completed: Patient at procedure or test/unavailable. Pt is NPO for a procedure today. Will f/u tomorrow for PO trials. Pt has a firm NG tube set to suction which may impede swallow function. Cortrak is much softer and facilitates swallowing while still offering nutrition. May be a more comfortable option if suction is not needed.   Herbie Baltimore, MA CCC-SLP  Acute Rehabilitation Services Pager [phone] Office [phone]  Lynann Beaver 06/12/2020, 10:09 AM

## 2020-06-12 NOTE — Progress Notes (Signed)
NAME:  [name], MRN:  [id number], DOB:  [birth year], LOS: 8 ADMISSION DATE:  06/04/2020, CONSULTATION DATE:  06/04/2020 REFERRING MD:  Dr. Haroldine Laws, CHIEF COMPLAINT:  VT cardiac arrest  Brief History   37 yoM with extensive cardiac history with recent STEMI 04/2020, with recurrent VT who developed severe bradycardia on amiodarone and lidocaine gtt requiring external pacing.  Intubated and emergently taken to cath lab for TVP, PA catheter, and R/LHC.  PCCM consulted for ventilator management.   History of present illness   HPI obtained from medical chart review as patient is sedated and intubated on mechanical ventilation.   64 year old male with history of CAD w/ previous CABG in 2014, chronic combined systolic and diastolic HF/ ICM, HTN, HLD, CVA, and recent inferior STEMI 05/03/20 secondary to thrombotic occlusion of the SVG to the RCA, likely due to missed ticagrelor doses.  EF noted to be 25% with moderate RV to severe RV dysfunction and possible LV thrombus 05/04/2020.  He was discharged home on 10/23 on a LifeVest, but apparently not using.    On 11/21, he developed dizziness at church.  EMS found patient in wide complex tachycardia treated with amiodarone and lidocaine with successful conversion to NSR.  On arrival to ER, patient had recurrence of his VT but hemodynamically tolerating.  He was given additional amiodarone and lidocaine gtt started, however developed severe bradycardia requiring atropine and external pacing.  He was intubated and taken emergently for temporary transvenous pacemaker and coronary angiogram.  Returns to ICU sedated and intubated, PCCM consulted for further ventilator management.   Past Medical History  CAD (s/p CABG in 2014 with LIMA-LAD, SVG-OM2 and SVG-RCA, s/p angioplasty to proximal SVG-RCA and DES to SVG-RCA anastomosisin 03/2019), chronic combined systolic and diastolic CHF/ICM (EF 78-29% by echo in 06/2019), HTN, HLD,prior CVA  Significant  Hospital Events   11/21 Admitted cardiology/ intubated/ L/RHC, TVP, PA catheter 11/22  Remains on NE, amio, heparin gtt, decreased UOP.  CVP 8, CI 1.34, co-ox 60-66%, started on milrinone 11/23 Tmax 101.7, w/ increased respiratory secretions prompted the initiation of antibiotics. co-ox 72%, CVP 12, CI 3.2, SVR 1090. Able to titrate off sedation and decrease NE; extubated 14:22 then re-intubated at 18:38.    Consults:  HF CCM EP  Procedures:  11/21 ETT >> 11/23; 11/23 >> 11/21 R IJ cordis with PA catheter >> 11/21 R femoral TVP >> 11/21 R radial aline >>  Significant Diagnostic Tests:  11/21 R/LHC >   Severe 3 vessel occlusive CAD. 2. Patent LIMA to the LAD 3. Patent SVG to OM1 4. Patent SVG to RCA. Much improved flow from prior PCI. 5. Mildly elevated LVEDP 17 mm Hg 6. Successful placement of temporary transvenous pacemaker.  Micro Data:  11/21 SARS2/ flu >>neg 11/21 MRSA PCR >>neg 11/23 BCx 2 >> 11/23 sputum >>  Antimicrobials:  11/23 cefepime >> 11/24 vanc >> 11/24  Interim history/subjective:  Sitting up, no overnight events, doing well on nasal cannula   Objective   Blood pressure (!) 143/97, pulse 68, temperature 100.1 F (37.8 C), temperature source Axillary, resp. rate (!) 22, height 5\' 7"  (1.702 m), weight 90.1 kg, SpO2 94 %. PAP: (20-42)/(3-20) 41/20 CVP:  [3 mmHg-20 mmHg] 20 mmHg      Intake/Output Summary (Last 24 hours) at 06/12/2020 0913 Last data filed at 06/12/2020 0600 Gross per 24 hour  Intake 440.6 ml  Output 550 ml  Net -109.4 ml   Filed Weights   06/10/20 0500 06/11/20 0500  06/12/20 0500  Weight: 93.5 kg 90.1 kg 90.1 kg   Examination: General: Acute on chronic ill-appearing elderly male, sitting up in bed and in no acute distress HEENT: Dry mucous membranes aphonic voice. Neuro: awake, conversational, oriented to person and place, confused to date and mildly so to situation, foll.  Moves all extremities weakly. CV: Sinus rhythm s1s2 regular  rate and rhythm, no murmur, rubs, or gallops,  PULM: Clear to auscultation bilaterally, GI: soft, bowel sounds active in all 4 quadrants, non-tender, non-distended, tolerating TF Extremities: warm/dry Skin: no rashes or lesions   Resolved Hospital Problem list   Possible LV thrombus-TEE negative Thrombocytopenia AKI   Assessment & Plan:   Recurrent VT possibly scar or bradycardia mediated Underlying sinus node dysfunction P: -EP primary, plan to continue oral amio and digoxin  -Will eventually need ICD prior to discharge, possibly 11/30  Cardiogenic shock Chronic systolic/ diastolic HF with biventricular failure.  Now well compensated.  Blood pressure has improved considerably. Milrinone discontinued, Swan removed P: -EF 25-30% -Started on Entresto and digoxin -Off pressors and Milrinone -continue spironolactone  CAD s/p CABG with recent inferior STEMI 04/2020 HTN P: -continue Asa, Lipitor, Plavix, Zetia  Acute hypoxic respiratory failure with small pleural effusion Now extubated P: -Continue to wean oxygen as tolerated -Incentive spirometry -Progressive ambulation  Nausea secondary to exaggerated gag response secondary to stroke and recent intubation Improved, remains NPO for pacemaker insertion P: -SLP to evaluate -Nasogastric tube for medications.  May need a period of nasogastric feeding.  Leukocytosis/ fever presumed HCAP -Complete 7 days of Cefepime    Best practice:  Diet: Clear diet.  Advance as tolerated Pain/Anxiety/Delirium protocol (if indicated): Off.  Avoid sedatives given baseline lethargy. VAP protocol (if indicated):   Incentive spirometry DVT prophylaxis: heparin infusion GI prophylaxis: PPI Glucose control: add SSI if > 180 Mobility: Progressive ambulation, PT consult Code Status: full  Family Communication: per primary  Disposition: ICU. If stable after ICD will likely be ready for Transfer  Labs   CBC: Recent Labs  Lab  06/08/20 0530 06/09/20 0331 06/10/20 0518 06/11/20 0331 06/12/20 0409  WBC 11.8* 9.3 11.0* 12.3* 14.3*  HGB 12.9* 12.5* 12.9* 13.7 13.8  HCT 38.2* 37.2* 40.0 43.3 42.3  MCV 93.6 93.2 94.6 94.5 93.6  PLT 152 167 179 222 235    Basic Metabolic Panel: Recent Labs  Lab 06/08/20 0530 06/08/20 0530 06/09/20 0331 06/10/20 0518 06/10/20 0945 06/11/20 0331 06/12/20 0409  NA 137   < > 135 138 138 139 139  K 3.6   < > 3.6 4.2 4.1 4.1 3.5  CL 105   < > 104 106 107 106 107  CO2 21*   < > 20* 20* 22 23 24   GLUCOSE 104*   < > 94 106* 106* 106* 94  BUN 18   < > 19 20 19 17 19   CREATININE 1.37*   < > 1.30* 1.23 1.27* 1.20 1.21  CALCIUM 7.7*   < > 7.8* 8.1* 8.1* 8.3* 8.0*  MG 1.8  --  2.3 2.0  --  1.8 2.1   < > = values in this interval not displayed.   GFR: Estimated Creatinine Clearance: 64.3 mL/min (by C-G formula based on SCr of 1.21 mg/dL). Recent Labs  Lab 06/05/20 1234 06/06/20 0450 06/06/20 0835 06/06/20 1058 06/07/20 0408 06/07/20 0408 06/08/20 0530 06/08/20 0530 06/09/20 0331 06/10/20 0518 06/11/20 0331 06/12/20 0409  PROCALCITON 0.22  --  0.21  --  0.31  --  0.53  --   --   --   --   --   WBC  --    < >  --   --  14.2*   < > 11.8*   < > 9.3 11.0* 12.3* 14.3*  LATICACIDVEN  --   --  1.3 0.9  --   --   --   --   --   --   --   --    < > = values in this interval not displayed.    Liver Function Tests: No results for input(s): AST, ALT, ALKPHOS, BILITOT, PROT, ALBUMIN in the last 168 hours. No results for input(s): LIPASE, AMYLASE in the last 168 hours. No results for input(s): AMMONIA in the last 168 hours.  ABG    Component Value Date/Time   PHART 7.331 (L) 06/06/2020 2036   PCO2ART 45.2 06/06/2020 2036   PO2ART 179 (H) 06/06/2020 2036   HCO3 24.0 06/06/2020 2036   TCO2 25 06/06/2020 2036   ACIDBASEDEF 2.0 06/06/2020 2036   O2SAT 70.9 06/12/2020 0409     Coagulation Profile: No results for input(s): INR, PROTIME in the last 168 hours.  Cardiac  Enzymes: No results for input(s): CKTOTAL, CKMB, CKMBINDEX, TROPONINI in the last 168 hours.  HbA1C: Hgb A1c MFr Bld  Date/Time Value Ref Range Status  05/03/2020 11:30 PM 6.1 (H) 4.8 - 5.6 % Final    Comment:    (NOTE)         Prediabetes: 5.7 - 6.4         Diabetes: >6.4         Glycemic control for adults with diabetes: <7.0   03/25/2019 02:39 AM 5.6 4.8 - 5.6 % Final    Comment:    (NOTE)         Prediabetes: 5.7 - 6.4         Diabetes: >6.4         Glycemic control for adults with diabetes: <7.0     CBG: Recent Labs  Lab 06/11/20 1536 06/11/20 1935 06/11/20 2328 06/12/20 0352 06/12/20 0732  GLUCAP 98 101* 93 101* 97    CRITICAL CARE Performed by: Otilio Carpen Suleman Gunning   Total critical care time: 35 minutes  Critical care time was exclusive of separately billable procedures and treating other patients.  Critical care was necessary to treat or prevent imminent or life-threatening deterioration.  Critical care was time spent personally by me on the following activities: development of treatment plan with patient and/or surrogate as well as nursing, discussions with consultants, evaluation of patient's response to treatment, examination of patient, obtaining history from patient or surrogate, ordering and performing treatments and interventions, ordering and review of laboratory studies, ordering and review of radiographic studies, pulse oximetry and re-evaluation of patient's condition.    Otilio Carpen Kiyonna Tortorelli, PA-C [facility]  Pager# [phone], if no answer [phone]

## 2020-06-12 NOTE — Plan of Care (Signed)
  Problem: Education: Goal: Ability to manage disease process will improve Outcome: Progressing   Problem: Cardiac: Goal: Ability to achieve and maintain adequate cardiopulmonary perfusion will improve Outcome: Progressing   Problem: Education: Goal: Knowledge of General Education information will improve Description: Including pain rating scale, medication(s)/side effects and non-pharmacologic comfort measures Outcome: Progressing   Problem: Health Behavior/Discharge Planning: Goal: Ability to manage health-related needs will improve Outcome: Progressing   Problem: Clinical Measurements: Goal: Ability to maintain clinical measurements within normal limits will improve Outcome: Progressing Goal: Diagnostic test results will improve Outcome: Progressing

## 2020-06-12 NOTE — Progress Notes (Signed)
   06/12/20 1600  Clinical Encounter Type  Visited With Patient and family together  Visit Type Code;Initial  Referral From Nurse  Consult/Referral To Chaplain  Spiritual Encounters  Spiritual Needs Emotional  The chaplain spoke with patient and his wife at bedside. The chaplain gave the advance directive paperwork to the patient and his wife to consider and gave education. The chaplain spoke to the patient and encouraged the patient to have hope. The patient and his wife agreed and thanked the chaplain for coming. The chaplain will follow up on AD paperwork as needed.

## 2020-06-12 NOTE — Evaluation (Signed)
Physical Therapy Evaluation Patient Details Name: [name] MRN: [id number] DOB: 1955-08-22 Today's Date: 06/12/2020   History of Present Illness  64 yo with a hx as stated above who presented to [facility] 06/04/20 after having a pre-syncopal symptoms including dizzinezss while at church earlier today found to be in VT (not wearing Life Vest as prescribed on recent hospital discharge) on EMS arrival. Has not been compliant with LifeVest stating the battery pack too bulky. PMH of CAD (s/p CABG in 2014 with LIMA-LAD, SVG-OM2 and SVG-RCA, s/p angioplasty to proximal SVG-RCA and DES to SVG-RCA anastomosis in 03/2019), chronic combined systolic and diastolic CHF/ICM (EF 17-61% by echo in 06/2019), HTN, HLD, prior CVA and recent STEMI with angioplasty of the distal bypass graft 05/04/20. Admitted 06/04/20 for treatment of wide complex tachycardia. Intubated 11/21-Extubated 11/23 and reintubated extubated 11/26 R radial aline 11/23 TEE 11/24: EF 30-35% no evidence of LV clot.   Clinical Impression  PTA pt living with wife in mobile home with 4 steps to enter. Pt reports complete independence prior to hospitalization, however pt reports he has slowed down the last couple weeks. Pt is limited in safe mobility by decreased safety awareness in presence of decreased strength and balance. Pt is currently modA for bed mobility, min A for transfers and mod A for ambulation with RW. PT recommends HHPT at discharge to work on improving strength, balance and endurance. PT will continue to follow acutely.    Follow Up Recommendations Home health PT;Supervision/Assistance - 24 hour    Equipment Recommendations  Rolling walker with 5" wheels    Recommendations for Other Services OT consult     Precautions / Restrictions Precautions Precautions: None Restrictions Weight Bearing Restrictions: No      Mobility  Bed Mobility Overal bed mobility: Needs Assistance Bed Mobility: Supine to Sit     Supine to sit:  Mod assist     General bed mobility comments: pt able to roll onto side and manage LE off bed requires modA for bringing trunk to upright     Transfers Overall transfer level: Needs assistance Equipment used: Rolling walker (2 wheeled) Transfers: Sit to/from Stand Sit to Stand: Min assist         General transfer comment: pt has to be told to wait for line management numerous times before standing, min A for steadying in standing.   Ambulation/Gait Ambulation/Gait assistance: Mod assist;Min assist Gait Distance (Feet): 25 Feet Assistive device: Rolling walker (2 wheeled) Gait Pattern/deviations: Step-through pattern;Decreased step length - right;Decreased step length - left;Trunk flexed;Shuffle Gait velocity: slowed Gait velocity interpretation: <1.31 ft/sec, indicative of household ambulator General Gait Details: min A progressing to modA for steadying, increasing knee buckling with distance, requires constant multimodal cuing for upright posture and proximity to RW     Balance Overall balance assessment: Needs assistance Sitting-balance support: Feet supported Sitting balance-Leahy Scale: Fair     Standing balance support: Bilateral upper extremity supported Standing balance-Leahy Scale: Poor Standing balance comment: requires bilateral UE support for steadying                              Pertinent Vitals/Pain Pain Assessment: No/denies pain    Home Living Family/patient expects to be discharged to:: Private residence Living Arrangements: Spouse/significant other Available Help at Discharge: Family;Available 24 hours/day Type of Home: Mobile home Home Access: Stairs to enter Entrance Stairs-Rails: Right;Left Entrance Stairs-Number of Steps: 4 Home Layout: One level Home Equipment: None  Prior Function Level of Independence: Independent                  Extremity/Trunk Assessment   Upper Extremity Assessment Upper Extremity  Assessment: Generalized weakness    Lower Extremity Assessment Lower Extremity Assessment: RLE deficits/detail;LLE deficits/detail RLE Deficits / Details: generalized weakness, pt reports being ticklish however has jerky motion when feet and LE are touched  LLE Deficits / Details: generalized weakness, pt reports being ticklish however has jerky motion when feet and LE are touched     Cervical / Trunk Assessment Cervical / Trunk Assessment: Kyphotic  Communication   Communication: Expressive difficulties (sof spoken)  Cognition Arousal/Alertness: Awake/alert Behavior During Therapy: Impulsive (needs to be told to wait ) Overall Cognitive Status: Impaired/Different from baseline Area of Impairment: Orientation;Following commands;Safety/judgement;Awareness;Problem solving                 Orientation Level: Disoriented to;Situation;Place     Following Commands: Follows one step commands with increased time;Follows multi-step commands inconsistently Safety/Judgement: Decreased awareness of safety;Decreased awareness of deficits Awareness: Emergent Problem Solving: Slow processing;Difficulty sequencing;Requires verbal cues;Requires tactile cues General Comments: pt requires multimodal cuing for safety, unaware of his increased weakness       General Comments General comments (skin integrity, edema, etc.): seated BP 158/102 HR at rest 71 bpm, with ambulation 98, SaO2 on 4L dropped to 88%O2 with ambulation, at rest >90%O2        Assessment/Plan    PT Assessment Patient needs continued PT services  PT Problem List Decreased strength;Decreased activity tolerance;Decreased balance;Decreased mobility;Decreased safety awareness       PT Treatment Interventions DME instruction;Gait training;Stair training;Functional mobility training;Therapeutic activities;Therapeutic exercise;Balance training;Cognitive remediation;Patient/family education;Wheelchair mobility training    PT Goals  (Current goals can be found in the Care Plan section)  Acute Rehab PT Goals Patient Stated Goal: go home  PT Goal Formulation: With patient Time For Goal Achievement: 06/26/20 Potential to Achieve Goals: Good    Frequency Min 3X/week    AM-PAC PT "6 Clicks" Mobility  Outcome Measure Help needed turning from your back to your side while in a flat bed without using bedrails?: None Help needed moving from lying on your back to sitting on the side of a flat bed without using bedrails?: A Little Help needed moving to and from a bed to a chair (including a wheelchair)?: A Little Help needed standing up from a chair using your arms (e.g., wheelchair or bedside chair)?: A Little Help needed to walk in hospital room?: A Lot Help needed climbing 3-5 steps with a railing? : Total 6 Click Score: 16    End of Session Equipment Utilized During Treatment: Gait belt;Oxygen Activity Tolerance: Patient tolerated treatment well Patient left: in chair;with call bell/phone within reach;with chair alarm set Nurse Communication: Mobility status PT Visit Diagnosis: Unsteadiness on feet (R26.81);Other abnormalities of gait and mobility (R26.89);Muscle weakness (generalized) (M62.81);Difficulty in walking, not elsewhere classified (R26.2)    Time: 0820-0902 PT Time Calculation (min) (ACUTE ONLY): 42 min   Charges:   PT Evaluation $PT Eval Moderate Complexity: 1 Mod PT Treatments $Gait Training: 8-22 mins $Therapeutic Activity: 8-22 mins        Jorgen Wolfinger B. Migdalia Dk PT, DPT Acute Rehabilitation Services Pager [phone] Office [phone]   [place] 06/12/2020, 9:19 AM

## 2020-06-12 NOTE — Progress Notes (Addendum)
Patient ID: [name], male   DOB: [birth year], 64 y.o.   MRN: [id number]     Advanced Heart Failure Rounding Note  PCP-Cardiologist: Sanda Klein, MD   Subjective:    Extubated 11/26.  Still coughing and hoarse, did not pass swallow evaluation. NGT placed 11/28.  TEE 11/24: EF 30-35% no evidence of LV clot.   On cefepime for PNA. Day 7/7.  Tm 99.7.   Off NE and milrinone, co-ox 71%. No CVP set up. Wt stable.   No further VT.   K 3.5 Mg 2.1   Objective:   Weight Range: 90.1 kg Body mass index is 31.11 kg/m.   Vital Signs:   Temp:  [99.3 F (37.4 C)-99.7 F (37.6 C)] 99.4 F (37.4 C) (11/29 0400) Pulse Rate:  [40-73] 68 (11/29 0600) Resp:  [17-28] 21 (11/29 0600) BP: (130-166)/(84-124) 143/101 (11/29 0600) SpO2:  [67 %-95 %] 92 % (11/29 0600) Weight:  [90.1 kg] 90.1 kg (11/29 0500) Last BM Date: 06/10/20  Weight change: Filed Weights   06/10/20 0500 06/11/20 0500 06/12/20 0500  Weight: 93.5 kg 90.1 kg 90.1 kg    Intake/Output:   Intake/Output Summary (Last 24 hours) at 06/12/2020 0721 Last data filed at 06/12/2020 0600 Gross per 24 hour  Intake 440.6 ml  Output 650 ml  Net -209.4 ml      Physical Exam    PHYSICAL EXAM: General:  Fatigue appearing. No respiratory difficulty HEENT: + NGT otherwise normal Neck: supple. Rt IJ Transducer in place, CVP not well visualized. Carotids 2+ bilat; no bruits. No lymphadenopathy or thyromegaly appreciated. Cor: PMI nondisplaced. Regular rate & rhythm. 2/6 SEM at RUSB  Lungs: decreased BS LUL, clear on the right. No wheezing  Abdomen: soft, nontender, nondistended. No hepatosplenomegaly. No bruits or masses. Good bowel sounds. Extremities: no cyanosis, clubbing, rash, edema Neuro: alert & oriented x 3, cranial nerves grossly intact. moves all 4 extremities w/o difficulty. Affect pleasant.    Telemetry   NSR 60s. No further VT.  personally reviewed.    Labs    CBC Recent Labs    06/11/20 0331  06/12/20 0409  WBC 12.3* 14.3*  HGB 13.7 13.8  HCT 43.3 42.3  MCV 94.5 93.6  PLT 222 092   Basic Metabolic Panel Recent Labs    06/11/20 0331 06/12/20 0409  NA 139 139  K 4.1 3.5  CL 106 107  CO2 23 24  GLUCOSE 106* 94  BUN 17 19  CREATININE 1.20 1.21  CALCIUM 8.3* 8.0*  MG 1.8 2.1   Liver Function Tests No results for input(s): AST, ALT, ALKPHOS, BILITOT, PROT, ALBUMIN in the last 72 hours. No results for input(s): LIPASE, AMYLASE in the last 72 hours. Cardiac Enzymes No results for input(s): CKTOTAL, CKMB, CKMBINDEX, TROPONINI in the last 72 hours.  BNP: BNP (last 3 results) Recent Labs    05/03/20 2330 05/04/20 0536  BNP 1,169.8* 1,408.4*    ProBNP (last 3 results) No results for input(s): PROBNP in the last 8760 hours.   D-Dimer No results for input(s): DDIMER in the last 72 hours. Hemoglobin A1C No results for input(s): HGBA1C in the last 72 hours. Fasting Lipid Panel Recent Labs    06/11/20 0331  TRIG 229*   Thyroid Function Tests Recent Labs    06/09/20 1245  TSH 1.557    Other results:   Imaging    DG Abd 1 View  Result Date: 06/11/2020 CLINICAL DATA:  Check gastric catheter placement EXAM: ABDOMEN - 1  VIEW COMPARISON:  Film from earlier in the same day. FINDINGS: Gastric catheter is again noted within the stomach although slightly withdrawn when compared with the prior exam. Scattered large and small bowel gas is noted. No obstructive changes are seen. IMPRESSION: Gastric catheter within the stomach. Electronically Signed   By: Inez Catalina M.D.   On: 06/11/2020 21:14   DG Abd 1 View  Result Date: 06/11/2020 CLINICAL DATA:  Nasogastric tube placement. EXAM: ABDOMEN - 1 VIEW COMPARISON:  Abdominal radiograph performed the same day. FINDINGS: An enteric tube terminates in the stomach. Nonobstructive bowel gas pattern is noted. IMPRESSION: Enteric tube terminates in the stomach. Electronically Signed   By: Zerita Boers M.D.   On:  06/11/2020 14:17   DG Abd 1 View  Result Date: 06/11/2020 CLINICAL DATA:  Orogastric tube placement EXAM: ABDOMEN - 1 VIEW COMPARISON:  June 06, 2020 FINDINGS: Orogastric tube tip is in the proximal stomach with the side port at the gastroesophageal junction. There is no bowel dilatation or air-fluid level to suggest bowel obstruction. No free air seen on supine examination. IMPRESSION: Orogastric tube tip in proximal stomach with side port at gastroesophageal junction. It may be prudent to consider advancing tube 5-6 cm. No bowel obstruction or free air evident on supine examination. Electronically Signed   By: Lowella Grip III M.D.   On: 06/11/2020 11:00     Medications:     Scheduled Medications: . amiodarone  200 mg Oral BID  . aspirin  81 mg Oral Daily  . atorvastatin  80 mg Oral q1800  . chlorhexidine  15 mL Mouth Rinse BID  . Chlorhexidine Gluconate Cloth  6 each Topical Daily  . Chlorhexidine Gluconate Cloth  6 each Topical Daily  . clopidogrel  75 mg Oral Daily  . digoxin  0.125 mg Oral Daily  . docusate  100 mg Oral BID  . enoxaparin (LOVENOX) injection  40 mg Subcutaneous Q24H  . escitalopram  10 mg Oral Daily  . ezetimibe  10 mg Oral Daily  . mouth rinse  15 mL Mouth Rinse q12n4p  . polyethylene glycol  17 g Oral Daily  . sacubitril-valsartan  1 tablet Oral BID  . sodium chloride flush  10-40 mL Intracatheter Q12H  . sodium chloride flush  3 mL Intravenous Q12H  . spironolactone  12.5 mg Oral Daily    Infusions: . sodium chloride Stopped (06/10/20 1010)  . sodium chloride Stopped (06/10/20 1516)  . ceFEPime (MAXIPIME) IV 2 g (06/12/20 0301)    PRN Medications: sodium chloride, sodium chloride, acetaminophen (TYLENOL) oral liquid 160 mg/5 mL, albuterol, guaiFENesin, haloperidol lactate, nitroGLYCERIN, ondansetron (ZOFRAN) IV, promethazine, sodium chloride flush, sodium chloride flush    Patient Profile   Sarp Vernier a 64 y.o.malewith aPMH of  CAD (s/p CABG in 2014 with LIMA-LAD, SVG-OM2 and SVG-RCA, s/p angioplasty to proximal SVG-RCA and DES to SVG-RCA anastomosisin 03/2019), chronic combined systolic and diastolic CHF/ICM (EF 13-08% by echo in 06/2019), HTN, HLD,prior CVAand recent STEMIwith angioplasty of the distal bypass graft10/21/21 d/c home with Life Vest.  Presented in VT requiring multiple amio bolus and lido but developed asystole.  Underwent external pacing and atropine then sent to Cath lab.      Assessment/Plan   1. VT arrest -> asystole - emergent cath 06/04/20 with stable coronary anatomy - suspect scar mediated VT - VT quiescent.  - Continue PO amiodarone 200 mg bid via NGT - TTVP discontinued 11/27, no further bradycardia.  - EP following. will  need dual chamber ICD prior to d/c - Keep K > 4.0 Mg > 2.0.   2. Acute on Chronic systolic HF with biventricular failure -> cardiogenic shock  - Echo 05/04/20 EF 25%. Moderate RV to severe RV dysfunction and possible LV thrombus. LifeVest placed. D/c'd home on 05/06/20 - RHC on admit with preserved output and well compensated filling pressures.  - Echo remains unchanged from October 2021, EF 25-30%, mod LVH, global hypokineses, aortic root dilatation 3.9 and ascending aorta 4.0 - TEE 11/24 EF 30-35% - Currently off NE and milrinone. Co-ox 71% - Continue digoxin 0.125  - Increase Entresto 49/51 bid - Increase spironolactone to 25 daily.   - Volume status ok, no loop diuretics currently  - remove Rt IJ transducer    3. CAD  - s/p CABG  - Inferior STEMI 05/03/20 s/p POBA SVG->PDA - Emergent cath 06/04/20 with stable coronary anatomy - C/w DAPT/statin via NGT  - No chest pain.   4. Possible LV thrombus on echo 05/04/20 - Repeat echo apical filling defect suspicious for LV thrombus vs false tendon -TEE 11/24 no LV thrombus - off AC  5. Acute hypoxic respiratory failure - intubated in setting of cardiac arrest - Extubated 11/23 at 14:22, required bipap  then re-intubated 11/23 at 18:38 - CCM consulted to help manage vent and sedation, recommendations and assistance appreciated.  - Extubated 11/26.  6.  LL PNA (HCAP) - COVID, Flu negative; lactic acid 1.0 -> 1.3 -> 0.9 - PCT 0.22 -> 0.21 ->  0.31 -> 0.53 - Cultures normal flora  - Remains on cefepime, day 7/7 (vanc stopped)   7.  AKI - baseline Cr on previous admission around 1.3 - Foley exchanged 11/23 d/t clot causing obstruction - Admission Cr 1.78, creatinine 1.2 today.   8.  Thrombocytopenia - Resolved  9. Dysphagia - Failed swallow evaluation - Hoarse/quiet voice, ?vocal cord damage.  - Now w/ NGT for meds - repeat swallow study today   7. Deconditioning - OOB and in chair today  - mobilize w/ PT/OT   Length of Stay: [address], PA-C  06/12/2020, 7:21 AM  Advanced Heart Failure Team Pager [phone] (M-F; [place])  Please contact [facility] for night-coverage after hours (4p -7a ) and weekends on [email or web address]  Patient seen and examined with the above-signed Advanced Practice Provider and/or Housestaff. I personally reviewed laboratory data, imaging studies and relevant notes. I independently examined the patient and formulated the important aspects of the plan. I have edited the note to reflect any of my changes or salient points. I have personally discussed the plan with the patient and/or family.  VT is quiescent on oral amio. Still with some confusion and trouble swallowing. NGT in. Denies CP or SOB. Asking for something to drink. Co-ox 71%.   General:  Lying flat in bed. No resp difficulty HEENT: normal +NGT Neck: supple. RIJ introducer. Carotids 2+ bilat; no bruits. No lymphadenopathy or thryomegaly appreciated. Cor: PMI nondisplaced. Regular rate & rhythm. No rubs, gallops or murmurs. Lungs: clear Abdomen: soft, nontender, nondistended. No hepatosplenomegaly. No bruits or masses. Good bowel sounds. Extremities: no cyanosis, clubbing, rash,  edema Neuro: alert. Mildly confused  cranial nerves grossly intact. moves all 4 extremities w/o difficulty. Affect pleasant  VT is quiescent. HF improved. Secretions/PNA improved. Mildly confused. Failed swallow study.   Will get introducer out. Titrate HF meds. Move to 2c if bed available. Unclear if dyspahgia is due to intubation or previous CVA. Repeat  swallow study. PT/OT to see.  Timing of device implant per EP. Doesn't appear ready currently.  Glori Bickers, MD  8:07 AM

## 2020-06-12 NOTE — Procedures (Signed)
Cortrak  Person Inserting Tube:  Maylon Peppers C, RD Tube Type:  Cortrak - 43 inches Tube Location:  Left nare Initial Placement:  Stomach Secured by: Bridle Technique Used to Measure Tube Placement:  Documented cm marking at nare/ corner of mouth Cortrak Secured At:  64 cm    Cortrak Tube Team Note:  Consult received to place a Cortrak feeding tube.   No x-ray is required. RN may begin using tube.   If the tube becomes dislodged please keep the tube and contact the Cortrak team at [email or web address] ([email or web address]) for replacement.  If after hours and replacement cannot be delayed, place a NG tube and confirm placement with an abdominal x-ray.    Lockie Pares., RD, LDN, CNSC See AMiON for contact information

## 2020-06-13 ENCOUNTER — Inpatient Hospital Stay (HOSPITAL_COMMUNITY): Payer: Medicare HMO

## 2020-06-13 ENCOUNTER — Telehealth (HOSPITAL_COMMUNITY): Payer: Self-pay

## 2020-06-13 DIAGNOSIS — I5023 Acute on chronic systolic (congestive) heart failure: Secondary | ICD-10-CM | POA: Diagnosis not present

## 2020-06-13 DIAGNOSIS — E7220 Disorder of urea cycle metabolism, unspecified: Secondary | ICD-10-CM | POA: Diagnosis not present

## 2020-06-13 DIAGNOSIS — I5082 Biventricular heart failure: Secondary | ICD-10-CM

## 2020-06-13 DIAGNOSIS — I2699 Other pulmonary embolism without acute cor pulmonale: Secondary | ICD-10-CM | POA: Diagnosis not present

## 2020-06-13 DIAGNOSIS — I5021 Acute systolic (congestive) heart failure: Secondary | ICD-10-CM

## 2020-06-13 DIAGNOSIS — R2990 Unspecified symptoms and signs involving the nervous system: Secondary | ICD-10-CM

## 2020-06-13 DIAGNOSIS — J9601 Acute respiratory failure with hypoxia: Secondary | ICD-10-CM | POA: Diagnosis not present

## 2020-06-13 DIAGNOSIS — I472 Ventricular tachycardia: Secondary | ICD-10-CM | POA: Diagnosis not present

## 2020-06-13 DIAGNOSIS — R570 Cardiogenic shock: Secondary | ICD-10-CM | POA: Diagnosis not present

## 2020-06-13 LAB — BASIC METABOLIC PANEL
Anion gap: 13 (ref 5–15)
BUN: 22 mg/dL (ref 8–23)
CO2: 24 mmol/L (ref 22–32)
Calcium: 8.5 mg/dL — ABNORMAL LOW (ref 8.9–10.3)
Chloride: 104 mmol/L (ref 98–111)
Creatinine, Ser: 1.13 mg/dL (ref 0.61–1.24)
GFR, Estimated: >60 mL/min (ref >60)
Glucose, Bld: 120 mg/dL — ABNORMAL HIGH (ref 70–99)
Potassium: 3.5 mmol/L (ref 3.5–5.1)
Sodium: 141 mmol/L (ref 135–145)

## 2020-06-13 LAB — CBC
HCT: 44.7 % (ref 39.0–52.0)
Hemoglobin: 14.8 g/dL (ref 13.0–17.0)
MCH: 30.5 pg (ref 26.0–34.0)
MCHC: 33.1 g/dL (ref 30.0–36.0)
MCV: 92 fL (ref 80.0–100.0)
Platelets: 313 10*3/uL (ref 150–400)
RBC: 4.86 MIL/uL (ref 4.22–5.81)
RDW: 12.6 % (ref 11.5–15.5)
WBC: 14.5 10*3/uL — ABNORMAL HIGH (ref 4.0–10.5)
nRBC: 0 % (ref 0.0–0.2)

## 2020-06-13 LAB — GLUCOSE, CAPILLARY
Glucose-Capillary: 109 mg/dL — ABNORMAL HIGH (ref 70–99)
Glucose-Capillary: 116 mg/dL — ABNORMAL HIGH (ref 70–99)
Glucose-Capillary: 121 mg/dL — ABNORMAL HIGH (ref 70–99)
Glucose-Capillary: 109 mg/dL — ABNORMAL HIGH (ref 70–99)

## 2020-06-13 LAB — HEPARIN LEVEL (UNFRACTIONATED): Heparin Unfractionated: 0.5 IU/mL (ref 0.30–0.70)

## 2020-06-13 LAB — PHOSPHORUS
Phosphorus: 1.9 mg/dL — ABNORMAL LOW (ref 2.5–4.6)
Phosphorus: 2 mg/dL — ABNORMAL LOW (ref 2.5–4.6)

## 2020-06-13 LAB — MAGNESIUM
Magnesium: 2 mg/dL (ref 1.7–2.4)
Magnesium: 1.8 mg/dL (ref 1.7–2.4)

## 2020-06-13 LAB — AMMONIA: Ammonia: 46 umol/L — ABNORMAL HIGH (ref 9–35)

## 2020-06-13 MED ORDER — POTASSIUM CHLORIDE 20 MEQ PO PACK
40.0000 meq | PACK | Freq: Once | ORAL | Status: AC
  Administered 2020-06-13: 40 meq
  Filled 2020-06-13: qty 2

## 2020-06-13 MED ORDER — IOHEXOL 350 MG/ML SOLN
60.0000 mL | Freq: Once | INTRAVENOUS | Status: AC | PRN
  Administered 2020-06-13: 60 mL via INTRAVENOUS

## 2020-06-13 MED ORDER — POTASSIUM & SODIUM PHOSPHATES 280-160-250 MG PO PACK
2.0000 | PACK | Freq: Once | ORAL | Status: AC
  Administered 2020-06-13: 2
  Filled 2020-06-13: qty 2

## 2020-06-13 MED ORDER — STROKE: EARLY STAGES OF RECOVERY BOOK
Freq: Once | Status: DC
  Filled 2020-06-13: qty 1

## 2020-06-13 MED ORDER — IOHEXOL 350 MG/ML SOLN
100.0000 mL | Freq: Once | INTRAVENOUS | Status: AC | PRN
  Administered 2020-06-13: 100 mL via INTRAVENOUS

## 2020-06-13 MED ORDER — LACTULOSE 10 GM/15ML PO SOLN
20.0000 g | Freq: Three times a day (TID) | ORAL | Status: DC
  Administered 2020-06-13 – 2020-06-18 (×14): 20 g
  Filled 2020-06-13 (×12): qty 30

## 2020-06-13 MED ORDER — LOPERAMIDE HCL 2 MG PO CAPS
2.0000 mg | ORAL_CAPSULE | ORAL | Status: DC | PRN
  Administered 2020-06-13: 2 mg via ORAL
  Filled 2020-06-13: qty 1

## 2020-06-13 MED ORDER — HEPARIN (PORCINE) 25000 UT/250ML-% IV SOLN
1200.0000 [IU]/h | INTRAVENOUS | Status: AC
  Administered 2020-06-13: 1200 [IU]/h via INTRAVENOUS
  Administered 2020-06-14 – 2020-06-15 (×2): 1150 [IU]/h via INTRAVENOUS
  Filled 2020-06-13 (×3): qty 250

## 2020-06-13 MED ORDER — LOPERAMIDE HCL 1 MG/7.5ML PO SUSP
2.0000 mg | ORAL | Status: DC | PRN
  Administered 2020-06-13: 2 mg
  Filled 2020-06-13 (×2): qty 15

## 2020-06-13 NOTE — Evaluation (Signed)
Occupational Therapy Evaluation Patient Details Name: [name] MRN: [id number] DOB: 1955/10/31 Today's Date: 06/13/2020    History of Present Illness 64 yo with a hx as stated above who presented to [facility] 06/04/20 after having a pre-syncopal symptoms including dizzinezss while at church earlier today found to be in [place] (not wearing Life Vest as prescribed on recent hospital discharge) on EMS arrival. Has not been compliant with LifeVest stating the battery pack too bulky. PMH of CAD (s/p CABG in 2014 with LIMA-LAD, SVG-OM2 and SVG-RCA, s/p angioplasty to proximal SVG-RCA and DES to SVG-RCA anastomosis in 03/2019), chronic combined systolic and diastolic CHF/ICM (EF 44-31% by echo in 06/2019), HTN, HLD, prior CVA and recent STEMI with angioplasty of the distal bypass graft 05/04/20. Admitted 06/04/20 for treatment of wide complex tachycardia. Intubated 11/21-Extubated 11/23 and reintubated extubated 11/26 R radial aline 11/23 TEE 11/24: EF 30-35% no evidence of LV clot.    Clinical Impression   PTA, pt was living with his wife and was independent and working. Pt currently requiring Min A for UB ADLs, Mod-Max A for LB ADLs, and Min A for functional transfers with RW. Pt presenting with weakness, decreased coordination and strength at right UE, poor cognition, impaired balance, and decreased activity tolerance. Pt presenting with new R-side weakness and coordination deficits both gross and fine; notified RN and stroke team called. Pt unable to perform finger opposition, rapid alternating movement, finger-to-nose, and composite extension at RUE. Pt would benefit from further acute OT to facilitate safe dc. Pending progress, recommend dc to home with HHOT for further OT to optimize safety, independence with ADLs, and return to PLOF. However, may benefit from CIR pending CT scan and progress with neuro symptoms.   HR 90-100s. SpO2 90% on RA with good pleth line    Follow Up Recommendations  Home health  OT;Supervision/Assistance - 24 hour (CIR?)    Equipment Recommendations  3 in 1 bedside commode    Recommendations for Other Services PT consult     Precautions / Restrictions Precautions Precautions: None      Mobility Bed Mobility Overal bed mobility: Needs Assistance Bed Mobility: Supine to Sit     Supine to sit: Min assist     General bed mobility comments: Min A for trunk control and gaining sitting balance    Transfers Overall transfer level: Needs assistance Equipment used: Rolling walker (2 wheeled) Transfers: Sit to/from [facility] Sit to Stand: Min assist Stand pivot transfers: Min assist       General transfer comment: Min A for power up into standing. Min A to maintain balance and manage RW. Presenting with poor coorindation of RUE to place on walker    Balance Overall balance assessment: Needs assistance Sitting-balance support: Feet supported Sitting balance-Leahy Scale: Fair     Standing balance support: Bilateral upper extremity supported;During functional activity Standing balance-Leahy Scale: Poor Standing balance comment: Reliant on UE support                           ADL either performed or assessed with clinical judgement   ADL Overall ADL's : Needs assistance/impaired Eating/Feeding: NPO   Grooming: Wash/dry face;Sitting;Min guard Grooming Details (indicate cue type and reason): While sitting at EOB, pt washing his face. However, reaching for washcloth initially with reaching with RUE, having difficulty with coorindation and grasp, and switching to reach with LUE. Asking pt which is his dominant hand, and he reports R. Pt proceeds  to wash face with LUE as he is having dififculty coorindating RUE. Unable to bring wash cloth to face with R.  Upper Body Bathing: Minimal assistance;Sitting   Lower Body Bathing: Moderate assistance;Sit to/from stand   Upper Body Dressing : Minimal assistance;Sitting   Lower Body  Dressing: Maximal assistance;Sit to/from stand Lower Body Dressing Details (indicate cue type and reason): Pt reaching forward to adjust sock. Pt unable to coorindate RUE to open hand and place thumb into sock for then pulling up. Pt requiring assistance for adjusting socks and maintaining grasp onto top of sock.  Toilet Transfer: Minimal assistance;Stand-pivot;RW (simulated to recliner) Toilet Transfer Details (indicate cue type and reason): Min A for balance and power up. Pt with decreased sequencing of RLE and noting his sliding his foot at times during pivot         Functional mobility during ADLs: Minimal assistance;Rolling walker (pivot) General ADL Comments: Pt with decreased cognition, coorindation, balance, strength, and activity tolerance     Vision   Additional Comments: Denies diplopia. tracking to all four quad     Perception     Praxis      Pertinent Vitals/Pain Pain Assessment: No/denies pain     Hand Dominance Right   Extremity/Trunk Assessment Upper Extremity Assessment Upper Extremity Assessment: RUE deficits/detail RUE Deficits / Details: Pt presenting with decreased coorindation both fine and gross motor.  Pt unable to perform finger opposition. Poor and limited digit extension. Weak grasp strength. Limited gross motor coorindation to bring arms above head. unable to isolate 2nd digit to perform finger to nose test. Unable to perform rapid alternating movements.  RUE Sensation:  ("It just feels different.") RUE Coordination: decreased fine motor;decreased gross motor   Lower Extremity Assessment Lower Extremity Assessment: Defer to PT evaluation;RLE deficits/detail;LLE deficits/detail RLE Deficits / Details: During toe tapping, R foot with decreased coorindation and difficulty performing alteranting movement and synconized movement. generalized weakness, pt reports being ticklish however has jerky motion when feet and LE are touched. During functional transfer,  pt with poor coorindation and tendency to slide R foot. RLE Coordination: decreased fine motor LLE Deficits / Details: generalized weakness, pt reports being ticklish however has jerky motion when feet and LE are touched    Cervical / Trunk Assessment Cervical / Trunk Assessment: Kyphotic   Communication Communication Communication: Expressive difficulties (Slightly mumbles)   Cognition Arousal/Alertness: Awake/alert Behavior During Therapy: Flat affect Overall Cognitive Status: Impaired/Different from baseline Area of Impairment: Orientation;Following commands;Safety/judgement;Awareness;Problem solving;Memory                 Orientation Level: Disoriented to;Situation;Place;Time   Memory: Decreased short-term memory Following Commands: Follows one step commands with increased time;Follows multi-step commands inconsistently Safety/Judgement: Decreased awareness of safety;Decreased awareness of deficits Awareness: Emergent Problem Solving: Slow processing;Difficulty sequencing;Requires verbal cues;Requires tactile cues General Comments: Pt presenting with poor orientation and requiring cues for re-orienting to place and siutation. Pt stating he is at the hospital for his fourth heart attack. Pt also incontient with information, stating certain infomation to OT and then stating the opposition to stroke team a few minutes later. Pt requiring increased time and cues throughout for problem solving and sequencing.    General Comments  HR 90-100s. SpO2 90% on RA with good pleth line    Exercises     Shoulder Instructions      Home Living Family/patient expects to be discharged to:: Private residence Living Arrangements: Spouse/significant other Available Help at Discharge: Family;Available 24 hours/day Type of Home: Mobile  home Home Access: Stairs to enter [employer] of Steps: 4 Entrance Stairs-Rails: Right;Left Home Layout: One level     Bathroom Shower/Tub:  Teacher, early years/pre: Standard     Home Equipment: None          Prior Functioning/Environment Level of Independence: Independent        Comments: Works partime        OT Problem List: Decreased strength;Decreased range of motion;Decreased activity tolerance;Impaired balance (sitting and/or standing);Decreased knowledge of use of DME or AE;Decreased knowledge of precautions;Cardiopulmonary status limiting activity;Impaired UE functional use      OT Treatment/Interventions: Self-care/ADL training;Therapeutic exercise;Energy conservation;DME and/or AE instruction;Therapeutic activities;Patient/family education    OT Goals(Current goals can be found in the care plan section) Acute Rehab OT Goals Patient Stated Goal: Go home OT Goal Formulation: With patient Time For Goal Achievement: 06/27/20 Potential to Achieve Goals: Good  OT Frequency: Min 3X/week   Barriers to D/C:            Co-evaluation              AM-PAC OT "6 Clicks" Daily Activity     Outcome Measure Help from another person eating meals?: Total Help from another person taking care of personal grooming?: A Little Help from another person toileting, which includes using toliet, bedpan, or urinal?: A Little Help from another person bathing (including washing, rinsing, drying)?: A Lot Help from another person to put on and taking off regular upper body clothing?: A Little Help from another person to put on and taking off regular lower body clothing?: A Lot 6 Click Score: 14   End of Session Equipment Utilized During Treatment: Rolling walker;Gait belt Nurse Communication: Mobility status;Other (comment) (CVA symptoms)  Activity Tolerance: Patient tolerated treatment well Patient left: in chair;with call bell/phone within reach;with chair alarm set;with nursing/sitter in room (Stroke team RN)  OT Visit Diagnosis: Unsteadiness on feet (R26.81);Other abnormalities of gait and mobility  (R26.89);Muscle weakness (generalized) (M62.81)                Time: 6812-7517 OT Time Calculation (min): 31 min Charges:  OT General Charges $OT Visit: 1 Visit OT Evaluation $OT Eval Moderate Complexity: 1 Mod OT Treatments $Self Care/Home Management : 8-22 mins  Arsema Tusing MSOT, OTR/L Acute Rehab Pager: [phone] Office: [place] 06/13/2020, 9:02 AM

## 2020-06-13 NOTE — Consult Note (Signed)
Neurology Consultation  Reason for Consult: Right-sided weakness, confusion-code stroke Referring Physician: Dr. Grayce Sessions  CC: Right hand weakness, confusion  History is obtained from: Chart review, patient's care team  HPI: [name] is a 64 y.o. male past medical history of coronary artery disease, hypertension, dyslipidemia, STEMI's, stroke in the past with unclear residual deficits, currently admitted after a V. tach cardiac arrest after experiencing dizziness while at church on 06/04/2020 not wearing his LifeVest as had been prescribed on a prior recent hospital discharge, was in the cardiac ICU where he was noted that he had some right-sided weakness this morning by the occupational therapist. Review of notes reveals that he had been acting somewhat confused since yesterday-in the cardiology notes. The occupational therapist did not notice any deficits yesterday but this morning on evaluation noted right arm and leg weakness.  He had difficulty transferring from bed to chair with more right-sided weakness. Rapid response was called.  On stroke on evaluation, symptoms are rapidly improving-he did have some right-sided hand weakness, no drift on the right arm but subjectively felt weak.  He was also confused intermittently. A code stroke was activated-last known normal is unclear-is certainly not within the window for IV TPA but could be within the last 24 hours.  Chart review reveals history of LV thrombus for which she was on anticoagulation-heparin drip was discontinued last week due to most recent echo not showing LV thrombus LVEF 25 to 30%.  LKW: Unclear-could be with the last 24 hours tpa given?: no, outside the 4-1/2-hour window Premorbid modified Rankin scale (mRS):0  ROS: Difficult to reliably ascertain due to patient's mentation  Past Medical History:  Diagnosis Date  . Borderline diabetic   . Coronary artery disease    a. s/p CABG in 2014 with LIMA-LAD, SVG-OM2 and  SVG-RC b. 03/2019: STEMI and s/p angioplasty to proximal SVG-RCA and DES to SVG-RCA anastomosis c. 04/2020: recurrent STEMI with angioplasty of SVG-RCA  . Dyslipidemia 01/11/2013  . Hiatal hernia   . Hypertension   . Mini stroke ([place])    2  . S/P CABG x 3   . STEMI (ST elevation myocardial infarction) ([place])   . Stroke [facility])     Family History  Problem Relation Age of Onset  . Cancer Father   . Hypertension Father   . Hyperlipidemia Father   . Heart attack Father   . Hypertension Sister   . Hypertension Brother   . Hyperlipidemia Brother   . Heart attack Sister   . Heart attack Brother     Social History:   reports that he has quit smoking. His smoking use included pipe. He quit smokeless tobacco use about 51 years ago. He reports that he does not drink alcohol and does not use drugs.  Medications  Current Facility-Administered Medications:  .  0.9 %  sodium chloride infusion, , Intravenous, PRN, Vickie Epley, MD, Paused at 06/10/20 1010 .  0.9 %  sodium chloride infusion, , Intravenous, PRN, Vickie Epley, MD, Stopped at 06/10/20 1516 .  acetaminophen (TYLENOL) 160 MG/5ML solution 650 mg, 650 mg, Per Tube, Q4H PRN, Einar Grad, RPH .  albuterol (PROVENTIL) (2.5 MG/3ML) 0.083% nebulizer solution 2.5 mg, 2.5 mg, Nebulization, Q4H PRN, Agarwala, Ravi, MD, 2.5 mg at 06/09/20 2027 .  amiodarone (PACERONE) tablet 200 mg, 200 mg, Per Tube, BID, Einar Grad, RPH, 200 mg at 06/12/20 2207 .  aspirin chewable tablet 81 mg, 81 mg, Per Tube, Daily, Einar Grad,  RPH, 81 mg at 06/12/20 1135 .  atorvastatin (LIPITOR) tablet 80 mg, 80 mg, Per Tube, q1800, Einar Grad, RPH, 80 mg at 06/12/20 1135 .  chlorhexidine (PERIDEX) 0.12 % solution 15 mL, 15 mL, Mouth Rinse, BID, Vickie Epley, MD, 15 mL at 06/12/20 0958 .  Chlorhexidine Gluconate Cloth 2 % PADS 6 each, 6 each, Topical, Daily, Vickie Epley, MD, 6 each at 06/12/20 1029 .  Chlorhexidine  Gluconate Cloth 2 % PADS 6 each, 6 each, Topical, Daily, Vickie Epley, MD, 6 each at 06/12/20 1029 .  clopidogrel (PLAVIX) tablet 75 mg, 75 mg, Per Tube, Daily, Einar Grad, RPH, 75 mg at 06/12/20 1135 .  digoxin (LANOXIN) tablet 0.125 mg, 0.125 mg, Per Tube, Daily, Einar Grad, RPH, 0.125 mg at 06/12/20 1135 .  docusate (COLACE) 50 MG/5ML liquid 100 mg, 100 mg, Per Tube, BID, Einar Grad, RPH .  enoxaparin (LOVENOX) injection 40 mg, 40 mg, Subcutaneous, Q24H, Bensimhon, Shaune Pascal, MD, 40 mg at 06/12/20 1237 .  escitalopram (LEXAPRO) tablet 10 mg, 10 mg, Per Tube, Daily, Einar Grad, RPH, 10 mg at 06/12/20 1237 .  ezetimibe (ZETIA) tablet 10 mg, 10 mg, Per Tube, Daily, Einar Grad, RPH, 10 mg at 06/12/20 1135 .  feeding supplement (PROSource TF) liquid 45 mL, 45 mL, Per Tube, BID, Julian Hy, DO, 45 mL at 06/12/20 2207 .  feeding supplement (VITAL 1.5 CAL) liquid 1,000 mL, 1,000 mL, Per Tube, Continuous, Julian Hy, DO, Last Rate: 30 mL/hr at 06/13/20 0555, Rate Change at 06/13/20 0555 .  guaiFENesin (ROBITUSSIN) 100 MG/5ML solution 100 mg, 5 mL, Per Tube, Q4H PRN, Einar Grad, RPH, 100 mg at 06/12/20 1236 .  haloperidol lactate (HALDOL) injection 2 mg, 2 mg, Intravenous, Q15 min PRN, Kipp Brood, MD, 2 mg at 06/11/20 1821 .  loperamide (IMODIUM) capsule 2 mg, 2 mg, Oral, PRN, Vickie Epley, MD, 2 mg at 06/13/20 0309 .  MEDLINE mouth rinse, 15 mL, Mouth Rinse, q12n4p, Vickie Epley, MD, 15 mL at 06/12/20 1624 .  nitroGLYCERIN (NITROSTAT) SL tablet 0.4 mg, 0.4 mg, Sublingual, Q5 Min x 3 PRN, Bhagat, Bhavinkumar, PA .  ondansetron (ZOFRAN) injection 4 mg, 4 mg, Intravenous, Q6H PRN, Bhagat, Bhavinkumar, PA, 4 mg at 06/11/20 0133 .  polyethylene glycol (MIRALAX / GLYCOLAX) packet 17 g, 17 g, Per Tube, Daily, Einar Grad, RPH .  potassium chloride (KLOR-CON) packet 40 mEq, 40 mEq, Per Tube, Once, [place], PA-C .   promethazine (PHENERGAN) injection 6.25 mg, 6.25 mg, Intravenous, Q8H PRN, Agarwala, Ravi, MD, 6.25 mg at 06/11/20 2010 .  sacubitril-valsartan (ENTRESTO) 49-51 mg per tablet, 1 tablet, Per Tube, BID, Bensimhon, Shaune Pascal, MD, 1 tablet at 06/12/20 2207 .  sodium chloride flush (NS) 0.9 % injection 10-40 mL, 10-40 mL, Intracatheter, Q12H, Vickie Epley, MD, 10 mL at 06/12/20 2206 .  sodium chloride flush (NS) 0.9 % injection 10-40 mL, 10-40 mL, Intracatheter, PRN, Lars Mage T, MD .  sodium chloride flush (NS) 0.9 % injection 3 mL, 3 mL, Intravenous, Q12H, [place], Peter M, MD, 3 mL at 06/12/20 1000 .  sodium chloride flush (NS) 0.9 % injection 3 mL, 3 mL, Intravenous, PRN, [place], Peter M, MD .  spironolactone (ALDACTONE) tablet 25 mg, 25 mg, Per Tube, Daily, Bensimhon, Shaune Pascal, MD, 25 mg at 06/12/20 1237   Exam: Current vital signs: BP 140/76   Pulse 69   Temp 98.7 F (  37.1 C) (Oral)   Resp 18   Ht 5\' 7"  (1.702 m)   Wt 88.2 kg   SpO2 95%   BMI 30.45 kg/m  Vital signs in last 24 hours: Temp:  [97.8 F (36.6 C)-98.7 F (37.1 C)] 98.7 F (37.1 C) (11/30 0700) Pulse Rate:  [64-85] 69 (11/30 0600) Resp:  [15-29] 18 (11/30 0800) BP: (135-167)/(76-113) 140/76 (11/30 0800) SpO2:  [86 %-95 %] 95 % (11/30 0600) Weight:  [88.2 kg] 88.2 kg (11/30 0500) General: Awake alert in no distress HEENT: Cephalic atraumatic Lungs: Clear next cardiovascular: Regular rhythm excellent abdomen soft nondistended nontender Extremities warm well perfused Neurologic exam Awake alert oriented to self, to the fact that he is in the [employer]. To the month, he said December-it is November 30 today. He is able to tell me the current president but when I asked him who the president was before the current president, to come a few seconds to, with the name of the prior president. He is able to follow commands.  He is fluent.  He is speech is mildly dysarthric. Repetition is intact.  Naming  intact. Cranial nerves: Pupils equal round react light, extraocular movements intact, visual field full, facial symmetry is maintained, facial sensation intact, tongue and palate midline. Motor exam: No drift in any of the 4 extremities but he does have mild right grip strength weakness. Sensory exam: Intact to touch without extinction on double simultaneous stimulation Coordination: No dysmetria Gait testing deferred at this point NIH stroke scale: 1 for month.  Labs I have reviewed labs in epic and the results pertinent to this consultation are:  CBC    Component Value Date/Time   WBC 14.5 (H) 06/13/2020 0500   RBC 4.86 06/13/2020 0500   HGB 14.8 06/13/2020 0500   HGB 16.1 04/14/2019 1229   HCT 44.7 06/13/2020 0500   HCT 48.2 04/14/2019 1229   PLT 313 06/13/2020 0500   PLT 586 (H) 04/14/2019 1229   MCV 92.0 06/13/2020 0500   MCV 90 04/14/2019 1229   MCH 30.5 06/13/2020 0500   MCHC 33.1 06/13/2020 0500   RDW 12.6 06/13/2020 0500   RDW 12.6 04/14/2019 1229   LYMPHSABS 4.3 (H) 06/04/2020 1148   MONOABS 0.4 06/04/2020 1148   EOSABS 0.2 06/04/2020 1148   BASOSABS 0.1 06/04/2020 1148   CMP     Component Value Date/Time   NA 141 06/13/2020 0500   NA 139 05/23/2020 0724   K 3.5 06/13/2020 0500   CL 104 06/13/2020 0500   CO2 24 06/13/2020 0500   GLUCOSE 120 (H) 06/13/2020 0500   BUN 22 06/13/2020 0500   BUN 20 05/23/2020 0724   CREATININE 1.13 06/13/2020 0500   CALCIUM 8.5 (L) 06/13/2020 0500   PROT 7.3 06/04/2020 1148   PROT 6.9 05/03/2019 1226   ALBUMIN 3.9 06/04/2020 1148   ALBUMIN 4.0 05/03/2019 1226   AST 26 06/04/2020 1148   ALT 22 06/04/2020 1148   ALKPHOS 66 06/04/2020 1148   BILITOT 1.1 06/04/2020 1148   BILITOT 0.5 05/03/2019 1226   GFRNONAA >60 06/13/2020 0500   GFRAA 66 05/23/2020 0724   Lipid Panel     Component Value Date/Time   CHOL 174 05/04/2020 0536   CHOL 126 10/12/2019 0835   TRIG 229 (H) 06/11/2020 0331   HDL 29 (L) 05/04/2020 0536   HDL  29 (L) 10/12/2019 0835   CHOLHDL 6.0 05/04/2020 0536   VLDL 31 05/04/2020 0536   LDLCALC 114 (H) 05/04/2020  [place] 58 10/12/2019 0835   LDLDIRECT 94.8 03/23/2019 2046   Imaging I have reviewed the images obtained:  CT-scan of the brain-no acute changes.  Probably chronic right cerebellar stroke.  Assessment:  64 year old man with above past medical history who has been noted to be confused now for nearly 24 hours was also noted to have some right hand, arm and leg weakness this morning that is rapidly improving and on my examination only had some residual right grip weakness. He is mildly confused-to time but was able to answer all other orientation questions appropriately. There is no aphasia-he has mild dysarthria but I am guessing that his baseline speech. Due to unclear last normal, and clearly not within 4-1/2 hours-he is not a candidate for IV TPA. I am way to pursue vessel imaging just to ensure that there is no large vessel occlusion although with the exam seems reassuring since I do not have a clear last known well but it is somewhere within the last 24 hours. Embolic ischemic stroke is well within the realm of possibility given his extensive risk factors including cardiomyopathy, history of LV thrombus and recent post cardiac arrest status.  Also of note, he has leukocytosis and failed a swallow eval yesterday.  I would evaluate for aspiration pneumonia as well.  Impression: Right-sided weakness-evaluate for embolic ischemic strokes. Altered mental status Multifactorial toxic metabolic encephalopathy Evaluate for underlying aspiration pneumonia or UTI  Recommendations: Stat CTA head and neck with CT perfusion brain. Chest x-ray Labs to include ammonia levels, urinalysis Further recommendations to follow CTA head and neck and brain perfusion.  -- Amie Portland, MD [facility] Pager: [phone] If 7pm to 7am, please call on call as listed on  AMION.   Addendum CT head and neck with no emergent LVO There is significant calcification in the multiple vessels intracranially. There is also right vertebral occlusion.  Basilar artery has dense calcifications but no occlusion. Incidentally, there are bilateral pulmonary emboli seen on CT head and neck. I would recommend getting an MRI brain without contrast to look for the extent of embolic strokes.  Irrespective, if he needs anticoagulation for his PEs, heparin drip should be started stroke protocol-no bolus. If there are no strokes on the MRI, he can get full dose heparin I will order the MRI brain without contrast stat I have communicated my plan with the primary team over secure text I will follow up on the imaging  -- Amie Portland, MD [facility] Pager: [phone] If 7pm to 7am, please call on call as listed on AMION.   CRITICAL CARE ATTESTATION Performed by: Amie Portland, MD Total critical care time: 50 minutes Critical care time was exclusive of separately billable procedures and treating other patients and/or supervising APPs/Residents/Students Critical care was necessary to treat or prevent imminent or life-threatening deterioration due to strokelike symptoms, possible acute ischemic stroke, pulmonary emboli This patient is critically ill and at significant risk for neurological worsening and/or death and care requires constant monitoring. Critical care was time spent personally by me on the following activities: development of treatment plan with patient and/or surrogate as well as nursing, discussions with consultants, evaluation of patient's response to treatment, examination of patient, obtaining history from patient or surrogate, ordering and performing treatments and interventions, ordering and review of laboratory studies, ordering and review of radiographic studies, pulse oximetry, re-evaluation of patient's condition, participation in multidisciplinary  rounds and medical decision making of high complexity in the care of this patient.

## 2020-06-13 NOTE — Progress Notes (Addendum)
Patient ID: [name], male   DOB: [birth year], 64 y.o.   MRN: [id number]     Advanced Heart Failure Rounding Note  PCP-Cardiologist: Sanda Klein, MD   Subjective:    Noted to be more confused this am and w/ acute RUE weakness. CODE Stroke called. Sent for head CT. This showed no acute abnormality. Hypodensity right posterior cerebellum most consistent with chronic infarct.  Still mildly confused and requiring frequent reorientation. Thinks he's going home today.  No significant extremity weakness/ deficit on my exam.   Also w/ incidental finding of lobar and segmental pulmonary emboli noted in bilateral upper lobes on neck CT. No emergent large vessel occlusion or infarct/ischemia by perfusion.  O2 sats 95% on RA.   Delene Loll and Arlyce Harman increased yesterday. SBPs 140s. SCr/K both stable.  Wt down an additional 4 lb.   Completed abx for PNA yesterday. AF. WBC 14.5 (stable)  No further VT but frequent PVCs. K 3.5 Mg 2.0    Cor-trak in place. Awaiting repeat swallow eval.   Objective:   Weight Range: 88.2 kg Body mass index is 30.45 kg/m.   Vital Signs:   Temp:  [97.8 F (36.6 C)-98.7 F (37.1 C)] 98.7 F (37.1 C) (11/30 0700) Pulse Rate:  [64-85] 69 (11/30 0600) Resp:  [15-28] 18 (11/30 0800) BP: (135-167)/(76-113) 140/76 (11/30 0800) SpO2:  [86 %-95 %] 95 % (11/30 0600) Weight:  [88.2 kg] 88.2 kg (11/30 0500) Last BM Date: 06/12/20  Weight change: Filed Weights   06/11/20 0500 06/12/20 0500 06/13/20 0500  Weight: 90.1 kg 90.1 kg 88.2 kg    Intake/Output:   Intake/Output Summary (Last 24 hours) at 06/13/2020 1003 Last data filed at 06/13/2020 0700 Gross per 24 hour  Intake 506.04 ml  Output 1926 ml  Net -1419.96 ml      Physical Exam    PHYSICAL EXAM: General:  Well appearing but mildly confursed. No respiratory difficulty HEENT: normal + cor track  Neck: supple. JVD ~ 8cm . Carotids 2+ bilat; no bruits. No lymphadenopathy or thyromegaly  appreciated. Cor: PMI nondisplaced. Regular rate & rhythm. No rubs, gallops or murmurs. Lungs: clear Abdomen: soft, nontender, nondistended. No hepatosplenomegaly. No bruits or masses. Good bowel sounds. Extremities: no cyanosis, clubbing, rash, edema Neuro: alert but mildly confused. cranial nerves grossly intact. moves all 4 extremities w/o difficulty. Affect pleasant.   Telemetry   NSR 80s. No further VT. Frequent PVCs  personally reviewed.    Labs    CBC Recent Labs    06/12/20 0409 06/13/20 0500  WBC 14.3* 14.5*  HGB 13.8 14.8  HCT 42.3 44.7  MCV 93.6 92.0  PLT 264 825   Basic Metabolic Panel Recent Labs    06/12/20 0409 06/12/20 0409 06/12/20 1709 06/13/20 0500  NA 139  --   --  141  K 3.5  --   --  3.5  CL 107  --   --  104  CO2 24  --   --  24  GLUCOSE 94  --   --  120*  BUN 19  --   --  22  CREATININE 1.21  --   --  1.13  CALCIUM 8.0*  --   --  8.5*  MG 2.1   < > 1.9 2.0  PHOS  --   --  1.5* 1.9*   < > = values in this interval not displayed.   Liver Function Tests No results for input(s): AST, ALT, ALKPHOS, BILITOT, PROT, ALBUMIN in the  last 72 hours. No results for input(s): LIPASE, AMYLASE in the last 72 hours. Cardiac Enzymes No results for input(s): CKTOTAL, CKMB, CKMBINDEX, TROPONINI in the last 72 hours.  BNP: BNP (last 3 results) Recent Labs    05/03/20 2330 05/04/20 0536  BNP 1,169.8* 1,408.4*    ProBNP (last 3 results) No results for input(s): PROBNP in the last 8760 hours.   D-Dimer No results for input(s): DDIMER in the last 72 hours. Hemoglobin A1C No results for input(s): HGBA1C in the last 72 hours. Fasting Lipid Panel Recent Labs    06/11/20 0331  TRIG 229*   Thyroid Function Tests No results for input(s): TSH, T4TOTAL, T3FREE, THYROIDAB in the last 72 hours.  Invalid input(s): FREET3  Other results:   Imaging    CT Code Stroke CTA Head W/WO contrast  Result Date: 06/13/2020 CLINICAL DATA:  Right-sided  weakness EXAM: CT ANGIOGRAPHY HEAD AND NECK CT PERFUSION BRAIN TECHNIQUE: Multidetector CT imaging of the head and neck was performed using the standard protocol during bolus administration of intravenous contrast. Multiplanar CT image reconstructions and MIPs were obtained to evaluate the vascular anatomy. Carotid stenosis measurements (when applicable) are obtained utilizing NASCET criteria, using the distal internal carotid diameter as the denominator. Multiphase CT imaging of the brain was performed following IV bolus contrast injection. Subsequent parametric perfusion maps were calculated using RAPID software. CONTRAST:  171mL OMNIPAQUE IOHEXOL 350 MG/ML SOLN COMPARISON:  Noncontrast head CT from earlier today FINDINGS: CTA NECK FINDINGS Aortic arch: Delayed contrast bolus. No acute finding. There has been CABG. Atherosclerosis Right carotid system: Limited atheromatous changes without flow limiting stenosis, ulceration, or dissection. Left carotid system: Mixed density plaque at the proximal left ICA without ulceration, flow limiting stenosis, or dissection. Vertebral arteries: Limited assessment due to the poor bolus quality. The left vertebral artery is strongly dominant diffusely patent to the dura. The hypoplastic right vertebral artery is only faintly enhancing. No proximal subclavian stenosis. Skeleton: Cervical spine degeneration. Dental caries with periapical erosions. Other neck: No visible inflammation or incidental mass. Feeding tube is in place. Upper chest: Pulmonary embolism seen at the left lower lobar artery and in segmental right upper lobe branches. Especially the right upper lobe emboli appear branching and recent. Review of the MIP images confirms the above findings CTA HEAD FINDINGS Anterior circulation: Suboptimal bolus density. Calcified plaque at both carotid siphons without flow limiting stenosis seen. Negative for aneurysm. Negative for branch occlusion or beading. Posterior  circulation: Atheromatous plaque of the left vertebral and basilar arteries. The distal left V4 segment there is moderate or advanced stenosis, measurement limited by poor bolus density. No branch occlusion, beading, or aneurysm. Venous sinuses: Not opacified. Anatomic variants: None significant Review of the MIP images confirms the above findings CT Brain Perfusion Findings: ASPECTS: 10 CBF (<30%) Volume: 9mL Perfusion (Tmax>6.0s) volume: 65mL when accounting for artifactual signal at the superior sagittal sinus Critical Value/emergent results were called by telephone at the time of interpretation on 06/13/2020 at 9:30 am to provider [facility] , who verbally acknowledged these results. IMPRESSION: 1. No emergent large vessel occlusion or infarct/ischemia by perfusion. 2. Lobar and segmental pulmonary emboli incidentally noted in the upper lungs. 3. Minimal flow in the hypoplastic right vertebral artery. There is moderate to advanced atheromatous narrowing of the left V4 segment. 4. Carotid atherosclerosis without flow limiting stenosis or ulceration. 5. Suboptimal bolus. Electronically Signed   By: Monte Fantasia M.D.   On: 06/13/2020 09:33   CT Code Stroke CTA  Neck W/WO contrast  Result Date: 06/13/2020 CLINICAL DATA:  Right-sided weakness EXAM: CT ANGIOGRAPHY HEAD AND NECK CT PERFUSION BRAIN TECHNIQUE: Multidetector CT imaging of the head and neck was performed using the standard protocol during bolus administration of intravenous contrast. Multiplanar CT image reconstructions and MIPs were obtained to evaluate the vascular anatomy. Carotid stenosis measurements (when applicable) are obtained utilizing NASCET criteria, using the distal internal carotid diameter as the denominator. Multiphase CT imaging of the brain was performed following IV bolus contrast injection. Subsequent parametric perfusion maps were calculated using RAPID software. CONTRAST:  147mL OMNIPAQUE IOHEXOL 350 MG/ML SOLN COMPARISON:   Noncontrast head CT from earlier today FINDINGS: CTA NECK FINDINGS Aortic arch: Delayed contrast bolus. No acute finding. There has been CABG. Atherosclerosis Right carotid system: Limited atheromatous changes without flow limiting stenosis, ulceration, or dissection. Left carotid system: Mixed density plaque at the proximal left ICA without ulceration, flow limiting stenosis, or dissection. Vertebral arteries: Limited assessment due to the poor bolus quality. The left vertebral artery is strongly dominant diffusely patent to the dura. The hypoplastic right vertebral artery is only faintly enhancing. No proximal subclavian stenosis. Skeleton: Cervical spine degeneration. Dental caries with periapical erosions. Other neck: No visible inflammation or incidental mass. Feeding tube is in place. Upper chest: Pulmonary embolism seen at the left lower lobar artery and in segmental right upper lobe branches. Especially the right upper lobe emboli appear branching and recent. Review of the MIP images confirms the above findings CTA HEAD FINDINGS Anterior circulation: Suboptimal bolus density. Calcified plaque at both carotid siphons without flow limiting stenosis seen. Negative for aneurysm. Negative for branch occlusion or beading. Posterior circulation: Atheromatous plaque of the left vertebral and basilar arteries. The distal left V4 segment there is moderate or advanced stenosis, measurement limited by poor bolus density. No branch occlusion, beading, or aneurysm. Venous sinuses: Not opacified. Anatomic variants: None significant Review of the MIP images confirms the above findings CT Brain Perfusion Findings: ASPECTS: 10 CBF (<30%) Volume: 44mL Perfusion (Tmax>6.0s) volume: 38mL when accounting for artifactual signal at the superior sagittal sinus Critical Value/emergent results were called by telephone at the time of interpretation on 06/13/2020 at 9:30 am to provider [facility] , who verbally acknowledged these  results. IMPRESSION: 1. No emergent large vessel occlusion or infarct/ischemia by perfusion. 2. Lobar and segmental pulmonary emboli incidentally noted in the upper lungs. 3. Minimal flow in the hypoplastic right vertebral artery. There is moderate to advanced atheromatous narrowing of the left V4 segment. 4. Carotid atherosclerosis without flow limiting stenosis or ulceration. 5. Suboptimal bolus. Electronically Signed   By: Monte Fantasia M.D.   On: 06/13/2020 09:33   CT Code Stroke Cerebral Perfusion with contrast  Result Date: 06/13/2020 CLINICAL DATA:  Right-sided weakness EXAM: CT ANGIOGRAPHY HEAD AND NECK CT PERFUSION BRAIN TECHNIQUE: Multidetector CT imaging of the head and neck was performed using the standard protocol during bolus administration of intravenous contrast. Multiplanar CT image reconstructions and MIPs were obtained to evaluate the vascular anatomy. Carotid stenosis measurements (when applicable) are obtained utilizing NASCET criteria, using the distal internal carotid diameter as the denominator. Multiphase CT imaging of the brain was performed following IV bolus contrast injection. Subsequent parametric perfusion maps were calculated using RAPID software. CONTRAST:  136mL OMNIPAQUE IOHEXOL 350 MG/ML SOLN COMPARISON:  Noncontrast head CT from earlier today FINDINGS: CTA NECK FINDINGS Aortic arch: Delayed contrast bolus. No acute finding. There has been CABG. Atherosclerosis Right carotid system: Limited atheromatous changes without flow limiting  stenosis, ulceration, or dissection. Left carotid system: Mixed density plaque at the proximal left ICA without ulceration, flow limiting stenosis, or dissection. Vertebral arteries: Limited assessment due to the poor bolus quality. The left vertebral artery is strongly dominant diffusely patent to the dura. The hypoplastic right vertebral artery is only faintly enhancing. No proximal subclavian stenosis. Skeleton: Cervical spine degeneration.  Dental caries with periapical erosions. Other neck: No visible inflammation or incidental mass. Feeding tube is in place. Upper chest: Pulmonary embolism seen at the left lower lobar artery and in segmental right upper lobe branches. Especially the right upper lobe emboli appear branching and recent. Review of the MIP images confirms the above findings CTA HEAD FINDINGS Anterior circulation: Suboptimal bolus density. Calcified plaque at both carotid siphons without flow limiting stenosis seen. Negative for aneurysm. Negative for branch occlusion or beading. Posterior circulation: Atheromatous plaque of the left vertebral and basilar arteries. The distal left V4 segment there is moderate or advanced stenosis, measurement limited by poor bolus density. No branch occlusion, beading, or aneurysm. Venous sinuses: Not opacified. Anatomic variants: None significant Review of the MIP images confirms the above findings CT Brain Perfusion Findings: ASPECTS: 10 CBF (<30%) Volume: 50mL Perfusion (Tmax>6.0s) volume: 65mL when accounting for artifactual signal at the superior sagittal sinus Critical Value/emergent results were called by telephone at the time of interpretation on 06/13/2020 at 9:30 am to provider [facility] , who verbally acknowledged these results. IMPRESSION: 1. No emergent large vessel occlusion or infarct/ischemia by perfusion. 2. Lobar and segmental pulmonary emboli incidentally noted in the upper lungs. 3. Minimal flow in the hypoplastic right vertebral artery. There is moderate to advanced atheromatous narrowing of the left V4 segment. 4. Carotid atherosclerosis without flow limiting stenosis or ulceration. 5. Suboptimal bolus. Electronically Signed   By: Monte Fantasia M.D.   On: 06/13/2020 09:33   CT HEAD CODE STROKE WO CONTRAST`  Result Date: 06/13/2020 CLINICAL DATA:  Code stroke. EXAM: CT HEAD WITHOUT CONTRAST TECHNIQUE: Contiguous axial images were obtained from the base of the skull through the  vertex without intravenous contrast. COMPARISON:  CT head 03/24/2019 FINDINGS: Brain: Generalized atrophy most prominent the parietal lobes. Mild ventricular enlargement consistent with atrophy. Small hypodensity right inferior posterior cerebellum consistent with infarct. This was not present previously but is probably nonacute based on density. Negative for acute infarct, hemorrhage, mass. Vascular: Negative for hyperdense vessel. Atherosclerotic calcification in the distal left vertebral artery and basilar artery. Atherosclerotic calcification in the carotid artery bilaterally. Skull: Lytic lesions in the parietal bone bilaterally, stable since 2014. Sinuses/Orbits: Paranasal sinuses clear. Periapical lucency around multiple upper teeth. Negative orbit Other: None ASPECTS ([place] Stroke Program Early CT Score) - Ganglionic level infarction (caudate, lentiform nuclei, internal capsule, insula, M1-M3 cortex): 7 - Supraganglionic infarction (M4-M6 cortex): 3 Total score (0-10 with 10 being normal): 10 IMPRESSION: 1. No acute abnormality.  Generalized atrophy 2. Hypodensity right posterior cerebellum most consistent with chronic infarct, not present in 2020. 3. ASPECTS is 10 4. Code stroke imaging results were communicated on 06/13/2020 at 9:00 am to provider Rory Percy via Shea Evans Electronically Signed   By: Franchot Gallo M.D.   On: 06/13/2020 09:02     Medications:     Scheduled Medications: . amiodarone  200 mg Per Tube BID  . aspirin  81 mg Per Tube Daily  . atorvastatin  80 mg Per Tube q1800  . chlorhexidine  15 mL Mouth Rinse BID  . Chlorhexidine Gluconate Cloth  6 each Topical Daily  .  Chlorhexidine Gluconate Cloth  6 each Topical Daily  . clopidogrel  75 mg Per Tube Daily  . digoxin  0.125 mg Per Tube Daily  . docusate  100 mg Per Tube BID  . enoxaparin (LOVENOX) injection  40 mg Subcutaneous Q24H  . escitalopram  10 mg Per Tube Daily  . ezetimibe  10 mg Per Tube Daily  . feeding supplement  (PROSource TF)  45 mL Per Tube BID  . mouth rinse  15 mL Mouth Rinse q12n4p  . polyethylene glycol  17 g Per Tube Daily  . potassium chloride  40 mEq Per Tube Once  . sacubitril-valsartan  1 tablet Per Tube BID  . sodium chloride flush  10-40 mL Intracatheter Q12H  . sodium chloride flush  3 mL Intravenous Q12H  . spironolactone  25 mg Per Tube Daily    Infusions: . sodium chloride Stopped (06/10/20 1010)  . sodium chloride Stopped (06/10/20 1516)  . feeding supplement (VITAL 1.5 CAL) 30 mL/hr at 06/13/20 0555    PRN Medications: sodium chloride, sodium chloride, acetaminophen (TYLENOL) oral liquid 160 mg/5 mL, albuterol, guaiFENesin, haloperidol lactate, loperamide, nitroGLYCERIN, ondansetron (ZOFRAN) IV, promethazine, sodium chloride flush, sodium chloride flush    Patient Profile   [name] a 64 y.o.malewith aPMH of CAD (s/p CABG in 2014 with LIMA-LAD, SVG-OM2 and SVG-RCA, s/p angioplasty to proximal SVG-RCA and DES to SVG-RCA anastomosisin 03/2019), chronic combined systolic and diastolic CHF/ICM (EF 38-18% by echo in 06/2019), HTN, HLD,prior CVAand recent STEMIwith angioplasty of the distal bypass graft10/21/21 d/c home with Life Vest.  Presented in VT requiring multiple amio bolus and lido but developed asystole.  Underwent external pacing and atropine then sent to Cath lab.      Assessment/Plan   1. VT arrest -> asystole - emergent cath 06/04/20 with stable coronary anatomy - suspect scar mediated VT - VT quiescent.  - Continue PO amiodarone 200 mg bid via NGT - TTVP discontinued 11/27, no further bradycardia.  - EP following. Will need dual chamber ICD prior to d/c - Keep K > 4.0 Mg > 2.0.   2. Acute on Chronic systolic HF with biventricular failure -> cardiogenic shock  - Echo 05/04/20 EF 25%. Moderate RV to severe RV dysfunction and possible LV thrombus. LifeVest placed. D/c'd home on 05/06/20 - RHC on admit with preserved output and well compensated  filling pressures.  - Echo remains unchanged from October 2021, EF 25-30%, mod LVH, global hypokineses, aortic root dilatation 3.9 and ascending aorta 4.0 - TEE 11/24 EF 30-35% - Off NE and milrinone.  - Continue digoxin 0.125  - Continue Entresto 49/51 bid - Continue spironolactone 25 daily.   - Volume status ok, no loop diuretics currently    3. CAD  - s/p CABG  - Inferior STEMI 05/03/20 s/p POBA SVG->PDA - Emergent cath 06/04/20 with stable coronary anatomy - C/w DAPT/statin via NGT  - No chest pain.   4. Possible LV thrombus on echo 05/04/20 - Repeat echo apical filling defect suspicious for LV thrombus vs false tendon -TEE 11/24 no LV thrombus - currently off AC but w/ newly diagnosed PE, will need to discuss timing of restart w/ EP and neurology  5. Acute hypoxic respiratory failure - intubated in setting of cardiac arrest - Extubated 11/23 at 14:22, required bipap then re-intubated 11/23 at 18:38 - CCM consulted to help manage vent and sedation, recommendations and assistance appreciated.  - Extubated 11/26.  6.  LL PNA (HCAP) - COVID, Flu negative; lactic  acid 1.0 -> 1.3 -> 0.9 - PCT 0.22 -> 0.21 ->  0.31 -> 0.53 - Cultures normal flora  - Completed tx w/ Vanc + cefepime  7.  AKI - resolved  - baseline Cr on previous admission around 1.3 - Foley exchanged 11/23 d/t clot causing obstruction - Admission Cr 1.78, creatinine 1.1 today.   8.  Thrombocytopenia - Resolved  9. Dysphagia - Failed initial swallow evaluation - Hoarse/quiet voice, ?vocal cord damage.  - Now w/ NGT for meds - repeat swallow study today   7. Deconditioning - OOB and in chair today  - mobilize w/ PT/OT   9. Confusion  - Stat Head CT 11/30 for AMS + RUE weakness showed no acute abnormality. Hypodensity right posterior cerebellum most consistent with chronic infarct. Neurology following and recommends f/u brain MRI without contrast to look for the extent of embolic strokes - also  suspect multifactorial toxic metabolic encephalopathy - check UA and ammonia level  - frequent reorientation   10. Bilateral PE - incidental finding on neck CT, lobar and segmental pulmonary emboli noted in bilateral upper lobes - hemodynamically stable, O2 sats 95% on RA - per neurology, ok to start heparin. No bolus.    Length of Stay: [address], PA-C  06/13/2020, 10:03 AM  Advanced Heart Failure Team Pager [phone] (M-F; 7a - 4p)  Please contact [facility] for night-coverage after hours (4p -7a ) and weekends on [email or web address]  Agree with above.   Patient developed worsening confusion and acute RUE weakness this am. Code Stroke activated. Work-up has shown acute scattered embolic infarcts in the left cerebral hemisphere as well as upper lobe PEs. Neuro deficits improving quickly but not back to normal. Heparin restarted. Remains afebrile.   Remains off inotropes. SBP ok. Co-ox 71%. VT quiescent on po amio. Has yet to pass swallow study.   General:  Lying in bed  No resp difficulty HEENT: normal + cor-trak in palce Neck: supple. no JVD. Carotids 2+ bilat; no bruits. No lymphadenopathy or thryomegaly appreciated. Cor: PMI nondisplaced. Regular rate & rhythm. No rubs, gallops or murmurs. Lungs: clear Abdomen: soft, nontender, nondistended. No hepatosplenomegaly. No bruits or masses. Good bowel sounds. Extremities: no cyanosis, clubbing, rash, edema Neuro: alert follows commands mild RUE weakness  He has stabilized from a HF and VT standpoint but situatoin now complicated by acute CVA and bilateral upper lobe PEs. We have discussed with Neuro and CCM. Will restart heparin and plan dedicated chest CT in the next 1-2 days. Continue current HF meds. Avoid hypotension. Timing of ICD deferrd duet o current condition.   CRITICAL CARE Performed by: Glori Bickers  Total critical care time: 35 minutes  Critical care time was exclusive of separately billable procedures  and treating other patients.  Critical care was necessary to treat or prevent imminent or life-threatening deterioration.  Critical care was time spent personally by me (independent of midlevel providers or residents) on the following activities: development of treatment plan with patient and/or surrogate as well as nursing, discussions with consultants, evaluation of patient's response to treatment, examination of patient, obtaining history from patient or surrogate, ordering and performing treatments and interventions, ordering and review of laboratory studies, ordering and review of radiographic studies, pulse oximetry and re-evaluation of patient's condition.  Glori Bickers, MD  8:01 PM

## 2020-06-13 NOTE — Progress Notes (Signed)
  Speech Language Pathology Treatment: Dysphagia  Patient Details Name: [name] MRN: [id number] DOB: November 05, 1955 Today's Date: 06/13/2020 Time: 1345-1400 SLP Time Calculation (min) (ACUTE ONLY): 15 min  Assessment / Plan / Recommendation Clinical Impression  Pt was seen for dysphagia treatment. He was alert and cooperative throughout the session, but demonstrated some confusion. His vocal quality has improved greatly as minimal hoarseness was observed and intensity was much greater. He was observed with POs of thin liquid and puree, all of which were tolerated well without overt s/sx of aspiration. Pt demonstrated some difficulty manipulating spoon for self feeding, but hand-over-hand assistance provided by the SLP was beneficial. SLP will f/u for instrumental testing (MBS) to further assess swallow function. In the meantime, pt can have bites of floor stock puree and/or ice chips.   HPI HPI: 83 yoM with extensive cardiac history with recent STEMI 04/2020, D/Cd home on 10/23 with LIfevest, per notes was not using. Admitted 11/21 with Admitted 11/21 VT arrest, Acute on Chronic systolic HF with biventricular failure ->cardiogenic shock . Intubated and emergently taken to cath lab for temporary transvenous pacemaker and coronary angiogram.  Extubated 11/23 then reintubated four hours later, extubated 11/26.        SLP Plan  MBS       Recommendations  Diet recommendations: NPO;Other(comment) (bites of floor stock puree and ice chips) Medication Administration: Via alternative means                Oral Care Recommendations: Oral care QID;Oral care prior to ice chip/H20 Follow up Recommendations: None SLP Visit Diagnosis: Dysphagia, pharyngeal phase (R13.13) Plan: MBS       GO                Greggory Keen 06/13/2020, 2:12 PM

## 2020-06-13 NOTE — Code Documentation (Signed)
Stroke Response Nurse Documentation Code Documentation  [name] is a 64 y.o. male was admitted 06/04/20 for palpitations an dizziness. Found to be in ventricular tachycardia. Past medical history of coronary artery disease status post CABG 2014, CHF, hypertension, hyperlipidemia, stroke and recent STEMI with angioplasty 05/04/20.   Patient worked with occupational therapy this morning and noted patient had difficulty with right leg while walking to the chair. Also noted right arm weakness. Last known well unclear. Previous therapist did not report weakness. No weakness reported by night shift nurse. Upon arrival to room, patient was able to lift all extremities without drift. Therapist stated this was an improvement. Patient is unable to extend fingers on right hand. Feels his right arm is weak but leg is back to normal. Cardiology PA reports patient was using right hand this morning at 0730.   Discussed assessment with Dr. Arora. Stroke response RN at bedside to activate. Dr. Arora notified and met patient in CT.  NIHSS 1, see documentation for details and code stroke times. Patient with disoriented to month and left hand weakness on exam. The following imaging was completed:  CT, CTA head and neck, CTP. Delay in CTA & CTP due to IV placement. IV team waiting in CT for patient. Patient is not a candidate for tPA due to out of window. Care/Plan MRI. Bedside handoff with Karen RN.      Stroke Response RN   

## 2020-06-13 NOTE — Progress Notes (Signed)
NAME:  [name], MRN:  [id number], DOB:  1956-04-26, LOS: 9 ADMISSION DATE:  06/04/2020, CONSULTATION DATE:  06/04/2020 REFERRING MD:  Dr. Haroldine Laws, CHIEF COMPLAINT:  VT cardiac arrest  Brief History   64 yoM with extensive cardiac history with recent STEMI 04/2020, with recurrent VT who developed severe bradycardia on amiodarone and lidocaine gtt requiring external pacing.  Intubated and emergently taken to cath lab for TVP, PA catheter, and R/LHC.  PCCM consulted for ventilator management.   History of present illness   HPI obtained from medical chart review as patient is sedated and intubated on mechanical ventilation.   64 year old male with history of CAD w/ previous CABG in 2014, chronic combined systolic and diastolic HF/ ICM, HTN, HLD, CVA, and recent inferior STEMI 05/03/20 secondary to thrombotic occlusion of the SVG to the RCA, likely due to missed ticagrelor doses.  EF noted to be 25% with moderate RV to severe RV dysfunction and possible LV thrombus 05/04/2020.  He was discharged home on 10/23 on a LifeVest, but apparently not using.    On 11/21, he developed dizziness at church.  EMS found patient in wide complex tachycardia treated with amiodarone and lidocaine with successful conversion to NSR.  On arrival to ER, patient had recurrence of his VT but hemodynamically tolerating.  He was given additional amiodarone and lidocaine gtt started, however developed severe bradycardia requiring atropine and external pacing.  He was intubated and taken emergently for temporary transvenous pacemaker and coronary angiogram.  Returns to ICU sedated and intubated, PCCM consulted for further ventilator management.   Past Medical History  CAD (s/p CABG in 2014 with LIMA-LAD, SVG-OM2 and SVG-RCA, s/p angioplasty to proximal SVG-RCA and DES to SVG-RCA anastomosisin 03/2019), chronic combined systolic and diastolic CHF/ICM (EF 88-41% by echo in 06/2019), HTN, HLD,prior CVA  Significant  Hospital Events   11/21 Admitted cardiology/ intubated/ L/RHC, TVP, PA catheter 11/22  Remains on NE, amio, heparin gtt, decreased UOP.  CVP 8, CI 1.34, co-ox 60-66%, started on milrinone 11/23 Tmax 101.7, w/ increased respiratory secretions prompted the initiation of antibiotics. co-ox 72%, CVP 12, CI 3.2, SVR 1090. Able to titrate off sedation and decrease NE; extubated 14:22 then re-intubated at 18:38.    Consults:  HF CCM EP  Procedures:  11/21 ETT >> 11/23; 11/23 >> 11/21 R IJ cordis with PA catheter >> 11/21 R femoral TVP >> 11/21 R radial aline >>  Significant Diagnostic Tests:  11/21 R/LHC >   Severe 3 vessel occlusive CAD. 2. Patent LIMA to the LAD 3. Patent SVG to OM1 4. Patent SVG to RCA. Much improved flow from prior PCI. 5. Mildly elevated LVEDP 17 mm Hg 6. Successful placement of temporary transvenous pacemaker.  11/24 TEE >> neg for LV thrombus; EF 30-35%. Inferolateral HK  11/30 CTH >> 1. No acute abnormality.  Generalized atrophy 2. Hypodensity right posterior cerebellum most consistent with chronic infarct, not present in 2020. 3. ASPECTS is 10  11/30 CTA head/ neck/ perfusion >> 1. No emergent large vessel occlusion or infarct/ischemia by perfusion. 2. Lobar and segmental pulmonary emboli incidentally noted in the upper lungs. 3. Minimal flow in the hypoplastic right vertebral artery. There is moderate to advanced atheromatous narrowing of the left V4 segment. 4. Carotid atherosclerosis without flow limiting stenosis or ulceration. 5. Suboptimal bolus.  Micro Data:  11/21 SARS2/ flu >>neg 11/21 MRSA PCR >>neg 11/22 BCx 2 >> neg 11/23 sputum >> normal flora  Antimicrobials:  11/23 cefepime >> 11/29  11/24 vanc >> 11/24  Interim history/subjective:  Code stroke activated this morning for acute onset of right arm weakness.  Taken for CTH/ CTA head/ neck-> negative for LVO but showed right vertebral occlusion incidental bilateral lobar and segmental  PE's Wt 90.1-> 88.2; -1.1/ net +2.4 Afebrile   Objective   Blood pressure 140/76, pulse 69, temperature 98.7 F (37.1 C), temperature source Oral, resp. rate 18, height 5\' 7"  (1.702 m), weight 88.2 kg, SpO2 95 %.        Intake/Output Summary (Last 24 hours) at 06/13/2020 1000 Last data filed at 06/13/2020 0700 Gross per 24 hour  Intake 506.04 ml  Output 1926 ml  Net -1419.96 ml   Filed Weights   06/11/20 0500 06/12/20 0500 06/13/20 0500  Weight: 90.1 kg 90.1 kg 88.2 kg   Examination: General:  Chronically ill appearing older male in bed in NAD- mildly agitated and confused, wanting to go home and see his wife HEENT: MM pink/moist, clear voice, no facial deficits, pupils 4/ reactive, pupils 4/reactive Neuro: Awake oriented x 2, MAE- minimal weakness in right grip but no drift, no further deficits noted CV: rrr, NSR w/ occasional PVC, no murmur PULM:  Non labored, clear anteriorly, slight bibasilar rales  GI: soft, NT, +bs Extremities: warm/dry, no LE edema  Skin: no rashes   Resolved Hospital Problem list   Possible LV thrombus-TEE negative Thrombocytopenia AKI  Assessment & Plan:   Recurrent VT possibly scar or bradycardia mediated Underlying sinus node dysfunction P: - EP primary, plan to continue oral amio and digoxin  - Will eventually need ICD prior to discharge, hopefully by Friday  Cardiogenic shock Chronic systolic/ diastolic HF with biventricular failure. P: - Per HF team, continue digoxin, entresto, aldactone - strict I/Os, daily wts  CAD s/p CABG with recent inferior STEMI 04/2020 HTN P: - continue Asa, Lipitor, Plavix, Zetia  Acute hypoxic respiratory failure with small pleural effusion Dysphagia  Extubated 11/23 - Continue to wean oxygen as tolerated - Pulmonary hygiene- IS, PT, moblize, CPT, NT prn  - aspiration precautions; continue cortrak for EN/ meds; SLP following  Nausea secondary to exaggerated gag response secondary to stroke and  recent intubation Improved, remains NPO for pacemaker insertion - SLP to evaluate - Nasogastric tube for medications.  May need a period of nasogastric feeding.  Leukocytosis/ fever presumed HCAP - Complete 7 days of Cefepime 11/29; cultures negative/ normal flora; continue to monitor clinically  AKI- resolved - trend BMET, renal dose meds and avoid nephrotoxins - strict I/Os  Encephalopathy - likely multifactorial toxic/ metabolic Right sided weakness concerning for new stroke - pending Neuro recs - CTH showing hypodensity right posterior cerebellum c/w chronic infarct;  CTA head /neck neg for LVO; significant calcification in multiple vessels and right vertebral occlusion - trending neuro exams - MRI brain  - delirium precautions - pending ammonia/ UA  Bilateral lobar and segmental Pulmonary Embolism - incidental findings on stroke workup  - remains hemodynamically stable and on 2L [place] - bilateral venous dopplers to evaluate for DVT - depending on neuro recommendations, will need anticoagulation- likely heparin gtt for now and eventual DOAC  Deconditioning - PT/OT  Best practice:  Diet: NPO; SLP following Pain/Anxiety/Delirium protocol (if indicated): n/a VAP protocol (if indicated): n/a DVT prophylaxis: lovenox GI prophylaxis: PPI Glucose control: add SSI if > 180 Mobility: Progressive ambulation, PT/ OT/ SLP Code Status: full  Family Communication: per primary  Disposition: continue in ICU  Labs   CBC: Recent Labs  Lab 06/09/20 0331 06/10/20 0518 06/11/20 0331 06/12/20 0409 06/13/20 0500  WBC 9.3 11.0* 12.3* 14.3* 14.5*  HGB 12.5* 12.9* 13.7 13.8 14.8  HCT 37.2* 40.0 43.3 42.3 44.7  MCV 93.2 94.6 94.5 93.6 92.0  PLT 167 179 222 264 774    Basic Metabolic Panel: Recent Labs  Lab 06/10/20 0518 06/10/20 0945 06/11/20 0331 06/12/20 0409 06/12/20 1709 06/13/20 0500  NA 138 138 139 139  --  141  K 4.2 4.1 4.1 3.5  --  3.5  CL 106 107 106 107  --  104   CO2 20* 22 23 24   --  24  GLUCOSE 106* 106* 106* 94  --  120*  BUN 20 19 17 19   --  22  CREATININE 1.23 1.27* 1.20 1.21  --  1.13  CALCIUM 8.1* 8.1* 8.3* 8.0*  --  8.5*  MG 2.0  --  1.8 2.1 1.9 2.0  PHOS  --   --   --   --  1.5* 1.9*   GFR: Estimated Creatinine Clearance: 68.1 mL/min (by C-G formula based on SCr of 1.13 mg/dL). Recent Labs  Lab 06/06/20 1058 06/07/20 0408 06/07/20 0408 06/08/20 0530 06/09/20 0331 06/10/20 0518 06/11/20 0331 06/12/20 0409 06/13/20 0500  PROCALCITON  --  0.31  --  0.53  --   --   --   --   --   WBC  --  14.2*   < > 11.8*   < > 11.0* 12.3* 14.3* 14.5*  LATICACIDVEN 0.9  --   --   --   --   --   --   --   --    < > = values in this interval not displayed.    Liver Function Tests: No results for input(s): AST, ALT, ALKPHOS, BILITOT, PROT, ALBUMIN in the last 168 hours. No results for input(s): LIPASE, AMYLASE in the last 168 hours. No results for input(s): AMMONIA in the last 168 hours.  ABG    Component Value Date/Time   PHART 7.331 (L) 06/06/2020 2036   PCO2ART 45.2 06/06/2020 2036   PO2ART 179 (H) 06/06/2020 2036   HCO3 24.0 06/06/2020 2036   TCO2 25 06/06/2020 2036   ACIDBASEDEF 2.0 06/06/2020 2036   O2SAT 70.9 06/12/2020 0409     Coagulation Profile: No results for input(s): INR, PROTIME in the last 168 hours.  Cardiac Enzymes: No results for input(s): CKTOTAL, CKMB, CKMBINDEX, TROPONINI in the last 168 hours.  HbA1C: Hgb A1c MFr Bld  Date/Time Value Ref Range Status  05/03/2020 11:30 PM 6.1 (H) 4.8 - 5.6 % Final    Comment:    (NOTE)         Prediabetes: 5.7 - 6.4         Diabetes: >6.4         Glycemic control for adults with diabetes: <7.0   03/25/2019 02:39 AM 5.6 4.8 - 5.6 % Final    Comment:    (NOTE)         Prediabetes: 5.7 - 6.4         Diabetes: >6.4         Glycemic control for adults with diabetes: <7.0     CBG: Recent Labs  Lab 06/12/20 1526 06/12/20 1934 06/12/20 2325 06/13/20 0332  06/13/20 0802  GLUCAP Fielding, ACNP [facility] 06/13/2020, 10:00 AM  See Shea Evans for personal pager PCCM on call pager [phone]  [phone]    

## 2020-06-13 NOTE — Progress Notes (Signed)
Notified by OT that patient exhibited R sided weakness during transfer from bed to chair. This RN assessed patient and noted R sided weakness as well. This RN notified rapid response and requested assistance. After further assessment, code stroke was called and Tommye Standard, PA notified. Pt was immediately transported to CT.

## 2020-06-13 NOTE — Progress Notes (Signed)
ANTICOAGULATION CONSULT NOTE - Initial Consult  Pharmacy Consult for heparin Indication: pulmonary embolus  No Known Allergies  Patient Measurements: Height: 5\' 7"  (170.2 cm) Weight: 88.2 kg (194 lb 7.1 oz) IBW/kg (Calculated) : 66.1 Heparin Dosing Weight: 88kg  Vital Signs: Temp: 98.7 F (37.1 C) (11/30 0700) Temp Source: Oral (11/30 0700) BP: 140/76 (11/30 0800) Pulse Rate: 69 (11/30 0600)  Labs: Recent Labs    06/11/20 0331 06/11/20 0331 06/12/20 0409 06/13/20 0500  HGB 13.7   < > 13.8 14.8  HCT 43.3  --  42.3 44.7  PLT 222  --  264 313  CREATININE 1.20  --  1.21 1.13   < > = values in this interval not displayed.    Estimated Creatinine Clearance: 68.1 mL/min (by C-G formula based on SCr of 1.13 mg/dL).   Medical History: Past Medical History:  Diagnosis Date  . Borderline diabetic   . Coronary artery disease    a. s/p CABG in 2014 with LIMA-LAD, SVG-OM2 and SVG-RC b. 03/2019: STEMI and s/p angioplasty to proximal SVG-RCA and DES to SVG-RCA anastomosis c. 04/2020: recurrent STEMI with angioplasty of SVG-RCA  . Dyslipidemia 01/11/2013  . Hiatal hernia   . Hypertension   . Mini stroke ([place])    2  . S/P CABG x 3   . STEMI (ST elevation myocardial infarction) ([place])   . Stroke [facility])      Assessment: 80 yoM admitted with VT and concern for ACS. Cath clean and heparin resumed for presumed LV thrombus. Repeat TEE 11/24 showed no thrombus so heparin discontinued. Pt noted to have acute CVA 11/30 am, CT angio demonstrated incidental finding of PE. Neurology cleared stroke protocol heparin, CCM consulted to begin heparin infusion. Will defer bolus and target lower goal. Enoxaparin SQ for ppx given ~1045.   Goal of Therapy:  Heparin level 0.3-0.5 units/ml Monitor platelets by anticoagulation protocol: Yes   Plan:  Heparin 1200 units/h no bolus Check 6h heparin level  Arrie Senate, PharmD, [place], [facility] Clinical Pharmacist [phone] Please check AMION for all  [facility] Pharmacy numbers 06/13/2020

## 2020-06-13 NOTE — Progress Notes (Addendum)
Progress Note  Patient Name: Catherine Oak Date of Encounter: 06/13/2020  [facility] Cardiologist: Sanda Klein, MD   Subjective   In bed, confused, asking for his wife, Dr. Quentin Ore was able to re-orient him, discussed plans for today  Inpatient Medications    Scheduled Meds: . amiodarone  200 mg Per Tube BID  . aspirin  81 mg Per Tube Daily  . atorvastatin  80 mg Per Tube q1800  . chlorhexidine  15 mL Mouth Rinse BID  . Chlorhexidine Gluconate Cloth  6 each Topical Daily  . Chlorhexidine Gluconate Cloth  6 each Topical Daily  . clopidogrel  75 mg Per Tube Daily  . digoxin  0.125 mg Per Tube Daily  . docusate  100 mg Per Tube BID  . enoxaparin (LOVENOX) injection  40 mg Subcutaneous Q24H  . escitalopram  10 mg Per Tube Daily  . ezetimibe  10 mg Per Tube Daily  . feeding supplement (PROSource TF)  45 mL Per Tube BID  . mouth rinse  15 mL Mouth Rinse q12n4p  . polyethylene glycol  17 g Per Tube Daily  . sacubitril-valsartan  1 tablet Per Tube BID  . sodium chloride flush  10-40 mL Intracatheter Q12H  . sodium chloride flush  3 mL Intravenous Q12H  . spironolactone  25 mg Per Tube Daily   Continuous Infusions: . sodium chloride Stopped (06/10/20 1010)  . sodium chloride Stopped (06/10/20 1516)  . feeding supplement (VITAL 1.5 CAL) 30 mL/hr at 06/13/20 0555   PRN Meds: sodium chloride, sodium chloride, acetaminophen (TYLENOL) oral liquid 160 mg/5 mL, albuterol, guaiFENesin, haloperidol lactate, loperamide, nitroGLYCERIN, ondansetron (ZOFRAN) IV, promethazine, sodium chloride flush, sodium chloride flush   Vital Signs    Vitals:   06/13/20 0500 06/13/20 0600 06/13/20 0640 06/13/20 0700  BP: (!) 153/102 (!) 151/103 (!) 152/95 (!) 149/97  Pulse: 72 69    Resp: (!) 26 (!) 23 19 (!) 25  Temp:    98.7 F (37.1 C)  TempSrc:    Oral  SpO2: 94% 95%    Weight: 88.2 kg     Height:        Intake/Output Summary (Last 24 hours) at 06/13/2020 0819 Last data filed at  06/13/2020 0700 Gross per 24 hour  Intake 746.04 ml  Output 1926 ml  Net -1179.96 ml   Last 3 Weights 06/13/2020 06/12/2020 06/11/2020  Weight (lbs) 194 lb 7.1 oz 198 lb 10.2 oz 198 lb 10.2 oz  Weight (kg) 88.2 kg 90.1 kg 90.1 kg      Telemetry    SR, generally 70's, occ PVCs- Personally Reviewed  ECG    No new EKGs - Personally Reviewed  Physical Exam   GEN: somewhat chronically ill appearing Neck: No JVD Cardiac: RRR, no murmurs, rubs, or gallops Respiratory: decreased at the bases, no wheezing GI: Soft, nontender  MS: trace edema; No deformity. Neuro:  confused, able to re-orient some   Psych: very pleasent  Labs    High Sensitivity Troponin:   Recent Labs  Lab 06/04/20 1148  TROPONINIHS 157*      Chemistry Recent Labs  Lab 06/11/20 0331 06/12/20 0409 06/13/20 0500  NA 139 139 141  K 4.1 3.5 3.5  CL 106 107 104  CO2 23 24 24   GLUCOSE 106* 94 120*  BUN 17 19 22   CREATININE 1.20 1.21 1.13  CALCIUM 8.3* 8.0* 8.5*  GFRNONAA >60 >60 >60  ANIONGAP 10 8 13      Hematology Recent Labs  Lab 06/11/20 0331 06/12/20 0409 06/13/20 0500  WBC 12.3* 14.3* 14.5*  RBC 4.58 4.52 4.86  HGB 13.7 13.8 14.8  HCT 43.3 42.3 44.7  MCV 94.5 93.6 92.0  MCH 29.9 30.5 30.5  MCHC 31.6 32.6 33.1  RDW 12.9 12.5 12.6  PLT 222 264 313    BNPNo results for input(s): BNP, PROBNP in the last 168 hours.   DDimer No results for input(s): DDIMER in the last 168 hours.   Radiology    06/07/20: CXR  IMPRESSION: 1. Stable line positioning with stable left basilar atelectasis and/or infiltrate. 2. Very small, stable left pleural effusion.   Cardiac Studies   06/05/2020: TTE IMPRESSIONS  1. Technically difficult study with poor echo windows. Definity contrast  given.  2. Left ventricular ejection fraction, by estimation, is 25 to 30%. The  left ventricle has severely decreased function. The left ventricle  demonstrates global hypokinesis with regional variation  including apical  dyskineisis. There is moderate left  ventricular hypertrophy. Left ventricular diastolic parameters are  indeterminate. Linear filling defect at the apex, could be thrombus or  false tendon.  3. Right ventricular systolic function was not well visualized. The right  ventricular size is not well visualized.  4. The mitral valve was not well visualized. No evidence of mitral valve  regurgitation.  5. The aortic valve is tricuspid. Aortic valve regurgitation is trivial.  Mild aortic valve sclerosis is present, with no evidence of aortic valve  stenosis.  6. Aortic dilatation noted. There is borderline dilatation of the aortic  root, measuring 39 mm. There is mild dilatation of the ascending aorta,  measuring 40 mm.   Comparison(s): No significant change from prior study. 05/04/2020: LVEF  25-30%, 14 x 6 mm apical filing defect suspicious for LV thrombus. No  change in apical filling defect. Suspect false tendon, however, cannot  exclude thrombus given cardiomyopathy.    06/04/2020: R/LHC  Mid LAD lesion is 100% stenosed.  Prox Cx lesion is 100% stenosed.  Prox RCA lesion is 100% stenosed.  Dist RCA lesion is 20% stenosed.  Non-stenotic Origin to Prox Graft lesion was previously treated.  Prox Graft lesion is 30% stenosed.  LV end diastolic pressure is mildly elevated.   1. Severe 3 vessel occlusive CAD. 2. Patent LIMA to the LAD 3. Patent SVG to OM1 4. Patent SVG to RCA. Much improved flow from prior PCI. 5. Mildly elevated LVEDP 17 mm Hg 6. Successful placement of temporary transvenous pacemaker.  Plan: Dr Haroldine Laws placed a [name] catheter via the right internal jugular Vein. Will leave temporary transvenous pacemaker in place so that antiarrhythmic drugs can be used liberally.  Right heart cath demonstrates good cardiac output and right heart pressures so we deferred placing an IABP. Will need right femoral arterial sheath removed  manually.  RHC Findings:  Ao = 174/111 (138)  LV = 169/16 RA =  7 RV = 41/6 PA = 37/20 (28) PCW = 14 Fick cardiac output/index = 11.6/5.7 PVR = 1.2 WU FA sat = 99% PA sat = 85%, 86% SVC sat = 89%  High cardiac output with normal filling pressures. No evidence of intracardiac shunting.     05/04/2020: TTE IMPRESSIONS  1. Since the last study on 06/23/2019 LVEF has decreased from 35-40% to  25-30%, there is a suspicion for left ventricular apical thrombus  measuring 14 x 6 mm.  2. Left ventricular ejection fraction, by estimation, is 25 to 30%. The  left ventricle has severely decreased  function. The left ventricle  demonstrates global hypokinesis. The left ventricular internal cavity size  was moderately dilated. Left  ventricular diastolic parameters are consistent with Grade I diastolic  dysfunction (impaired relaxation). Elevated left atrial pressure.  3. Right ventricular systolic function is moderately reduced. The right  ventricular size is moderately enlarged. There is mildly elevated  pulmonary artery systolic pressure. The estimated right ventricular  systolic pressure is 26.8 mmHg.  4. Left atrial size was moderately dilated.  5. Right atrial size was moderately dilated.  6. The mitral valve is normal in structure. Mild mitral valve  regurgitation. No evidence of mitral stenosis.  7. The aortic valve is normal in structure. Aortic valve regurgitation is  mild. No aortic stenosis is present.  8. The inferior vena cava is normal in size with greater than 50%  respiratory variability, suggesting right atrial pressure of 3 mmHg.    Patient Profile     64 y.o. male with aPMH of CAD (s/p CABG in 2014 with LIMA-LAD, SVG-OM2 and SVG-RCA, s/p angioplasty to proximal SVG-RCA and DES to SVG-RCA anastomosisin 03/2019), chronic combined systolic and diastolic CHF/ICM (EF 34-19% by echo in 06/2019), HTN, HLD,prior CVAand recent STEMIwith angioplasty of the  distal bypass graft10/21/21  EMS [place] for [place] syncope at [place] found by EMS in [place]/VT treated with amio bolus' repeat bolus in ER >> asystole >> external pacing>> emergent cath/temp wire placement, intubated sedated.  Assessment & Plan   Dr. Quentin Ore has seen the patient this AM   . VT arrest -> asystole - emergent cath 06/04/20 with stable coronary anatomy - suspect scar mediated VT -  amio has been transitioned to PO (per tube)    Had some lido in the ER only - will planICD prior to d/c, once closer to d/c, hopefully by Friday - Keep K > 4.0 Mg > 2.0 - no bradycardia, temp wire is out (11/27)  2. Chronic systolic HF with biventricular failure -> cardiogenic shock  - Echo 05/04/20 EF 25%. Moderate RV to severe RV dysfunction and possible LV thrombus. LifeVest placed. D/c'd home on 05/06/20 - RHC on admit with preserved output and well compensated filling pressures.  Off NE and milrinone, co-ox 71% On dig, entresto (titrated up yesterday), aldactone Will defer to HF team timing for betablocker, BP has room  3. CAD  - s/p CABG  - Inferior STEMI 05/03/20 s/p POBA SVG->PDA - Emergent cath 06/04/20 with stable coronary anatomy - on DAPT/statin/zetia  4. Possible LV thrombus on echo 05/04/20 TEE  This admission with no thrombus Off heparin gtt last week   5. Acute hypoxic respiratory failure - intubated in setting of cardiac arrest Extubated 11/23 Intubated 11/23 Extubated 11/26 Failed swallow, NGT for meds >> cortrack yesterday Getting feeds  Pending repeat swallow test today, d/w RN  6. fever      99-100 >. None in last 24hrs      BC x2 are neg for 5 days      Pneumonia (HCAP)      WBC >>  14     C/w CCM   7. AKI/CKD     Renal function remains stable  8. Delirium     Noted by Dr. Quentin Ore yesterday     Worse overnight/this AM observed by nursing     Pt is able to re-orient, he has awareness of his delirium     D/w RN, states wife planned to be her this  AM     OOB, reorient often,  mobilize, PT to continue to see pt       ADDEND: Called by RN 9. R arm weakness reported by the patient to RN     Rapid response was called     Rapid stroke RN at bedside mentions pt unable to straighten his hand and verbalized R arm/hand weakness, no drift noted, no LE deficits noted      Pt en-route to CT      Dr. Quentin Ore made aware  For questions or updates, please contact [facility] Please consult [email or web address] for contact info under        Signed, Baldwin [place], PA-C  06/13/2020, 8:19 AM

## 2020-06-13 NOTE — Progress Notes (Signed)
ANTICOAGULATION CONSULT NOTE - Initial Consult  Pharmacy Consult for heparin Indication: pulmonary embolus  No Known Allergies  Patient Measurements: Height: 5\' 7"  (170.2 cm) Weight: 88.2 kg (194 lb 7.1 oz) IBW/kg (Calculated) : 66.1 Heparin Dosing Weight: 88kg  Vital Signs: Temp: 97.5 F (36.4 C) (11/30 1900) Temp Source: Oral (11/30 1900) BP: 161/81 (11/30 2000) Pulse Rate: 30 (11/30 2000)  Labs: Recent Labs    06/11/20 0331 06/11/20 0331 06/12/20 0409 06/13/20 0500 06/13/20 1947  HGB 13.7   < > 13.8 14.8  --   HCT 43.3  --  [name].3 44.7  --   PLT 222  --  264 313  --   HEPARINUNFRC  --   --   --   --  0.50  CREATININE 1.20  --  1.21 1.13  --    < > = values in this interval not displayed.    Estimated Creatinine Clearance: 68.1 mL/min (by C-G formula based on SCr of 1.13 mg/dL).   Assessment: [name] admitted with VT and concern for ACS. Cath clean and heparin resumed for presumed LV thrombus. Repeat TEE 11/24 showed no thrombus so heparin discontinued. Pt noted to have acute CVA 11/30 am, CT angio demonstrated incidental finding of PE. Neurology cleared stroke protocol heparin, CCM consulted to begin heparin infusion. Will defer bolus and target lower goal.  Hep lvl 0.5 this evening - at high end of stroke goal range  Goal of Therapy:  Heparin level 0.3-0.5 units/ml Monitor platelets by anticoagulation protocol: Yes   Plan:  Slightly reduce heparin to 1150 units/hr to maintain in goal range F/u repeat levels in am  Barth Kirks, PharmD, BCPS, BCCCP Clinical Pharmacist [phone]  Please check AMION for all [place] numbers  06/13/2020 8:56 PM

## 2020-06-13 NOTE — Progress Notes (Addendum)
Appreciate neurology, eval and w/u in progress Await final thoughts/recommendations  pt is AAO self and place, asks appropriate questions knows Barbette Or is president tells me it is 2022. Cant recall why he is in the hospital but knows where he is Full neuro exam has been done by neuro team   CT noted old stroke, no acute  CT angio head  IMPRESSION: 1. No emergent large vessel occlusion or infarct/ischemia by perfusion. 2. Lobar and segmental pulmonary emboli incidentally noted in the upper lungs. 3. Minimal flow in the hypoplastic right vertebral artery. There is moderate to advanced atheromatous narrowing of the left V4 segment. 4. Carotid atherosclerosis without flow limiting stenosis or ulceration. 5. Suboptimal bolus.  I have discussed with CCM and Dr. Haroldine Laws Will need to wait on neurology for timing of anticoagulation (not likely to be heparin)  I have updated Dr. Quentin Ore We will be planning a device implant hopefully in the next few days, we would favor given PE incidental and not pulmonary compromised currently to plan anticoagulation POST ICD implant if not needed more urgently Will get venous dopplers done  Will await CCM, HF team, and neurology inputs as well.  Tommye Standard, PA-C   ADDEND: Appreciate neurology help, Dr. Rory Percy noted: Incidentally, there are bilateral pulmonary emboli seen on CT head and neck. I would recommend getting an MRI brain without contrast to look for the extent of embolic strokes.  Irrespective, if he needs anticoagulation for his PEs, heparin drip should be started stroke protocol-no bolus. If there are no strokes on the MRI, he can get full dose heparin CCM has started heparin gtt (stroke protocol)  Tommye Standard, PA-C

## 2020-06-13 NOTE — Telephone Encounter (Signed)
No response from pt.  Closed referral  

## 2020-06-13 NOTE — Progress Notes (Signed)
MRI brain with scattered embolic-looking infarcts in the left hemisphere. Patient also has bilateral PEs. Given the stroke as well as PEs-it is okay to start anticoagulation with heparin drip-stroke protocol no bolus for the management of the PE. Low likelihood of hemorrhagic transformation of the small embolic-looking infarcts. Stroke team will continue to follow with you.  -- Amie Portland, MD [facility] Pager: [phone] If 7pm to 7am, please call on call as listed on AMION.

## 2020-06-14 ENCOUNTER — Other Ambulatory Visit: Payer: Self-pay

## 2020-06-14 ENCOUNTER — Inpatient Hospital Stay (HOSPITAL_COMMUNITY): Payer: Medicare HMO

## 2020-06-14 DIAGNOSIS — I2693 Single subsegmental pulmonary embolism without acute cor pulmonale: Secondary | ICD-10-CM

## 2020-06-14 DIAGNOSIS — I6310 Cerebral infarction due to embolism of unspecified precerebral artery: Secondary | ICD-10-CM

## 2020-06-14 DIAGNOSIS — I2699 Other pulmonary embolism without acute cor pulmonale: Secondary | ICD-10-CM

## 2020-06-14 DIAGNOSIS — I513 Intracardiac thrombosis, not elsewhere classified: Secondary | ICD-10-CM

## 2020-06-14 DIAGNOSIS — J9601 Acute respiratory failure with hypoxia: Secondary | ICD-10-CM | POA: Diagnosis not present

## 2020-06-14 DIAGNOSIS — I82443 Acute embolism and thrombosis of tibial vein, bilateral: Secondary | ICD-10-CM

## 2020-06-14 DIAGNOSIS — I5023 Acute on chronic systolic (congestive) heart failure: Secondary | ICD-10-CM | POA: Diagnosis not present

## 2020-06-14 DIAGNOSIS — R570 Cardiogenic shock: Secondary | ICD-10-CM | POA: Diagnosis not present

## 2020-06-14 DIAGNOSIS — I639 Cerebral infarction, unspecified: Secondary | ICD-10-CM

## 2020-06-14 DIAGNOSIS — I472 Ventricular tachycardia: Secondary | ICD-10-CM | POA: Diagnosis not present

## 2020-06-14 LAB — MAGNESIUM: Magnesium: 1.9 mg/dL (ref 1.7–2.4)

## 2020-06-14 LAB — GLUCOSE, CAPILLARY
Glucose-Capillary: 116 mg/dL — ABNORMAL HIGH (ref 70–99)
Glucose-Capillary: 116 mg/dL — ABNORMAL HIGH (ref 70–99)
Glucose-Capillary: 117 mg/dL — ABNORMAL HIGH (ref 70–99)
Glucose-Capillary: 121 mg/dL — ABNORMAL HIGH (ref 70–99)
Glucose-Capillary: 126 mg/dL — ABNORMAL HIGH (ref 70–99)
Glucose-Capillary: 127 mg/dL — ABNORMAL HIGH (ref 70–99)
Glucose-Capillary: 132 mg/dL — ABNORMAL HIGH (ref 70–99)

## 2020-06-14 LAB — BASIC METABOLIC PANEL
Anion gap: 12 (ref 5–15)
BUN: 19 mg/dL (ref 8–23)
CO2: 23 mmol/L (ref 22–32)
Calcium: 8.3 mg/dL — ABNORMAL LOW (ref 8.9–10.3)
Chloride: 104 mmol/L (ref 98–111)
Creatinine, Ser: 0.99 mg/dL (ref 0.61–1.24)
GFR, Estimated: >60 mL/min (ref >60)
Glucose, Bld: 127 mg/dL — ABNORMAL HIGH (ref 70–99)
Potassium: 3.3 mmol/L — ABNORMAL LOW (ref 3.5–5.1)
Sodium: 139 mmol/L (ref 135–145)

## 2020-06-14 LAB — CBC
HCT: 41.5 % (ref 39.0–52.0)
Hemoglobin: 14.4 g/dL (ref 13.0–17.0)
MCH: 30.8 pg (ref 26.0–34.0)
MCHC: 34.7 g/dL (ref 30.0–36.0)
MCV: 88.7 fL (ref 80.0–100.0)
Platelets: 363 10*3/uL (ref 150–400)
RBC: 4.68 MIL/uL (ref 4.22–5.81)
RDW: 12.7 % (ref 11.5–15.5)
WBC: 14.2 10*3/uL — ABNORMAL HIGH (ref 4.0–10.5)
nRBC: 0 % (ref 0.0–0.2)

## 2020-06-14 LAB — ECHOCARDIOGRAM LIMITED BUBBLE STUDY
Height: 67 in
Weight: 3111.13 oz

## 2020-06-14 LAB — HEPARIN LEVEL (UNFRACTIONATED): Heparin Unfractionated: 0.33 IU/mL (ref 0.30–0.70)

## 2020-06-14 LAB — LIPID PANEL
Cholesterol: 131 mg/dL (ref 0–200)
HDL: 25 mg/dL — ABNORMAL LOW (ref >40)
LDL Cholesterol: 73 mg/dL (ref 0–99)
Total CHOL/HDL Ratio: 5.2 RATIO
Triglycerides: 165 mg/dL — ABNORMAL HIGH (ref <150)
VLDL: 33 mg/dL (ref 0–40)

## 2020-06-14 LAB — PHOSPHORUS: Phosphorus: 2.1 mg/dL — ABNORMAL LOW (ref 2.5–4.6)

## 2020-06-14 LAB — HEMOGLOBIN A1C
Hgb A1c MFr Bld: 5.8 % — ABNORMAL HIGH (ref 4.8–5.6)
Mean Plasma Glucose: 119.76 mg/dL

## 2020-06-14 MED ORDER — MUPIROCIN 2 % EX OINT: 1.0000 "application " | TOPICAL_OINTMENT | Freq: Two times a day (BID) | CUTANEOUS | Status: DC

## 2020-06-14 MED ORDER — POTASSIUM CHLORIDE 20 MEQ PO PACK
40.0000 meq | PACK | Freq: Once | ORAL | Status: AC
  Administered 2020-06-14: 40 meq
  Filled 2020-06-14: qty 2

## 2020-06-14 MED ORDER — MAGNESIUM SULFATE 2 GM/50ML IV SOLN
2.0000 g | Freq: Once | INTRAVENOUS | Status: AC
  Administered 2020-06-14: 2 g via INTRAVENOUS
  Filled 2020-06-14: qty 50

## 2020-06-14 MED ORDER — POTASSIUM & SODIUM PHOSPHATES 280-160-250 MG PO PACK
2.0000 | PACK | Freq: Once | ORAL | Status: AC
  Administered 2020-06-14: 2
  Filled 2020-06-14: qty 2

## 2020-06-14 MED ORDER — SACUBITRIL-VALSARTAN 97-103 MG PO TABS
1.0000 | ORAL_TABLET | Freq: Two times a day (BID) | ORAL | Status: DC
  Administered 2020-06-14 – 2020-06-17 (×7): 1 via ORAL
  Filled 2020-06-14 (×10): qty 1

## 2020-06-14 NOTE — Progress Notes (Signed)
Modified Barium Swallow Progress Note  Patient Details  Name: [name] MRN: [id number] Date of Birth: [birth year]  Today's Date: 06/14/2020  Modified Barium Swallow completed.  Full report located under Chart Review in the Imaging Section.  Brief recommendations include the following:  Clinical Impression  Pt was seen for MBS which revealed a mild pharyngeal dysphagia. His oral phase of swallowing was characterized by timely manipulation and transit of POs. During the pharyngeal swallow, the pt demonstrated timely swallow initiation and adequate hyolaryngeal excursion. Epiglottic deflection was slightly reduced, leading to two instances of trace penetration/aspiration. Given 3 oz of thin liquid, one instance of trace penetration was observed during the swallow (PAS 2). While taking a pill with thin liquid, the pill became lodged in the valleculae. With additional sips of thin liquid to transit the pill, a trace amount of thin liquid entered the airway and fell below the vocal folds before being ejected out (PAS 6). During an esophageal sweep, the pill was observed to hesitate mid-esophagus. At this time, a regular diet and thin liquids are recommended. Provide meds whole with puree. Pt has some R sided weakness that will require assistance with meals. SLP will f/u acutely for diet toleration.    Swallow Evaluation Recommendations       SLP Diet Recommendations: Regular solids;Thin liquid   Liquid Administration via: Cup;Straw   Medication Administration: Whole meds with puree   Supervision: Staff to assist with self feeding   Compensations: Slow rate;Small sips/bites   Postural Changes: Seated upright at 90 degrees   Oral Care Recommendations: Oral care BID        Greggory Keen 06/14/2020,10:13 AM

## 2020-06-14 NOTE — Progress Notes (Signed)
  Echocardiogram 2D Echocardiogram limited bubble study has been performed.  Jennette Dubin 06/14/2020, 10:33 AM

## 2020-06-14 NOTE — Evaluation (Signed)
Speech Language Pathology Evaluation Patient Details Name: [name] MRN: [id number] DOB: 02/01/1956 Today's Date: 06/14/2020 Time: 7371-0626 SLP Time Calculation (min) (ACUTE ONLY): 20 min  Problem List:  Patient Active Problem List   Diagnosis Date Noted  . Acute stroke due to ischemia ([place])   . Acute pulmonary embolism without acute cor pulmonale (HCC)   . Cardiogenic shock ([place])   . Ventricular tachycardia ([place]) 06/04/2020  . NSVT (nonsustained ventricular tachycardia) ([place]) 05/26/2020  . Heart attack ([place]) 05/04/2020  . Coronary artery disease involving coronary bypass graft of native heart without angina pectoris 06/25/2019  . Chronic combined systolic and diastolic heart failure ([place]) 06/25/2019  . Dilated aortic root ([place]) 06/25/2019  . CAD S/P urgent PCI 03/23/2019 04/22/2019  . Ischemic cardiomyopathy 04/22/2019  . Aortic root enlargement ([place]) 04/22/2019  . Stage 3a chronic kidney disease ([place]) 04/22/2019  . Hyperkalemia 04/22/2019  . Respiratory failure with hypoxia ([place])   . Altered mental status 03/23/2019  . Status post CVA 02/03/2013  . Weakness 01/11/2013  . Dyslipidemia 01/11/2013  . Hx of CABG 09/02/2012  . ST elevation myocardial infarction (STEMI) ([place]) 09/01/2012  . Essential hypertension   . Prediabetes   . Hiatal hernia    Past Medical History:  Past Medical History:  Diagnosis Date  . Borderline diabetic   . Coronary artery disease    a. s/p CABG in 2014 with LIMA-LAD, SVG-OM2 and SVG-RC b. 03/2019: STEMI and s/p angioplasty to proximal SVG-RCA and DES to SVG-RCA anastomosis c. 04/2020: recurrent STEMI with angioplasty of SVG-RCA  . Dyslipidemia 01/11/2013  . Hiatal hernia   . Hypertension   . Mini stroke ([place])    2  . S/P CABG x 3   . STEMI (ST elevation myocardial infarction) ([place])   . Stroke [facility])    Past Surgical History:  Past Surgical History:  Procedure Laterality Date  . CARDIAC CATHETERIZATION    . CORONARY ARTERY BYPASS GRAFT  N/A 09/01/2012   Procedure: CORONARY ARTERY BYPASS GRAFTING (CABG);  Surgeon: Gaye Pollack, MD;  Location: [facility];  Service: Open Heart Surgery;  Laterality: N/A;  Coronary Artery Bypass Grafting Times Three Using Left Internal Mammary Artery and Left Saphenous leg Vein Harvested Endoscopically  . CORONARY STENT INTERVENTION N/A 03/23/2019   Procedure: CORONARY STENT INTERVENTION;  Surgeon: Lorretta Harp, MD;  Location: [place] CV LAB;  Service: Cardiovascular;  Laterality: N/A;  . CORONARY/GRAFT ACUTE MI REVASCULARIZATION N/A 03/23/2019   Procedure: Coronary/Graft Acute MI Revascularization;  Surgeon: Lorretta Harp, MD;  Location: [place] CV LAB;  Service: Cardiovascular;  Laterality: N/A;  . CORONARY/GRAFT ACUTE MI REVASCULARIZATION N/A 05/03/2020   Procedure: Coronary/Graft Acute MI Revascularization;  Surgeon: Belva Crome, MD;  Location: [place] CV LAB;  Service: Cardiovascular;  Laterality: N/A;  . IABP INSERTION N/A 06/04/2020   Procedure: IABP Insertion;  Surgeon: Martinique, Peter M, MD;  Location: [place] CV LAB;  Service: Cardiovascular;  Laterality: N/A;  . INTRAOPERATIVE TRANSESOPHAGEAL ECHOCARDIOGRAM N/A 09/01/2012   Procedure: INTRAOPERATIVE TRANSESOPHAGEAL ECHOCARDIOGRAM;  Surgeon: Gaye Pollack, MD;  Location: [facility] OR;  Service: Open Heart Surgery;  Laterality: N/A;  . LEFT HEART CATH AND CORONARY ANGIOGRAPHY N/A 03/23/2019   Procedure: LEFT HEART CATH AND CORONARY ANGIOGRAPHY;  Surgeon: Lorretta Harp, MD;  Location: [place] CV LAB;  Service: Cardiovascular;  Laterality: N/A;  . LEFT HEART CATH AND CORONARY ANGIOGRAPHY N/A 05/03/2020   Procedure: LEFT HEART CATH AND CORONARY ANGIOGRAPHY;  Surgeon: Belva Crome,  MD;  Location: [place] CV LAB;  Service: Cardiovascular;  Laterality: N/A;  . LEFT HEART CATHETERIZATION WITH CORONARY ANGIOGRAM N/A 08/31/2012   Procedure: LEFT HEART CATHETERIZATION WITH CORONARY ANGIOGRAM;  Surgeon: Peter M Martinique, MD;  [facility];  Service: Cardiovascular;  Laterality: N/A;  . PERCUTANEOUS CORONARY STENT INTERVENTION (PCI-S) N/A 08/31/2012   Procedure: PERCUTANEOUS CORONARY STENT INTERVENTION (PCI-S);  Surgeon: Peter M Martinique, MD;  Location: [facility] CATH LAB;  Service: Cardiovascular;  Laterality: N/A;  . RIGHT/LEFT HEART CATH AND CORONARY/GRAFT ANGIOGRAPHY N/A 06/04/2020   Procedure: RIGHT/LEFT HEART CATH AND CORONARY/GRAFT ANGIOGRAPHY;  Surgeon: Martinique, Peter M, MD;  Location: [facility] CV LAB;  Service: Cardiovascular;  Laterality: N/A;  . TEMPORARY PACEMAKER N/A 06/04/2020   Procedure: TEMPORARY PACEMAKER;  Surgeon: Martinique, Peter M, MD;  Location: [place] CV LAB;  Service: Cardiovascular;  Laterality: N/A;   HPI:  18 yoM with extensive cardiac history with recent STEMI 04/2020, D/Cd home on 10/23 with LIfevest, per notes was not using. Admitted 11/21 with Admitted 11/21 VT arrest, Acute on Chronic systolic HF with biventricular failure ->cardiogenic shock . Intubated and emergently taken to cath lab for temporary transvenous pacemaker and coronary angiogram.  Extubated 11/23 then reintubated four hours later, extubated 11/26.     Assessment / Plan / Recommendation Clinical Impression  Pt was seen for SLE which revealed cognitive impairment in the areas of orientation, memory, and attention. Although he was oriented to time and person, he was disoriented to situation and place. Significant memory impairments were observed with both storage and retrieval deficits and decreased recall of new information and long-term memory. He also demonstrated difficulty sustaining his attention to the tasks of the evaluation. The WAB-Bedside was adminstered and he acheived a Bedside Aphasia Score of 59/60, indicating that both language comprehension and expression were WNL. Pt would benefit from ongoing SLP treatment to address cognitive impairments. SLP will continue to f/u acutely.     SLP Assessment  SLP  Recommendation/Assessment: Patient needs continued Speech Lanaguage Pathology Services SLP Visit Diagnosis: Cognitive communication deficit (R41.841)    Follow Up Recommendations  Outpatient SLP    Frequency and Duration min 2x/week  2 weeks      SLP Evaluation Cognition  Overall Cognitive Status: Impaired/Different from baseline Arousal/Alertness: Awake/alert Orientation Level: Disoriented to situation;Disoriented to place Attention: Focused;Sustained Focused Attention: Appears intact Sustained Attention: Impaired Memory: Impaired Memory Impairment: Storage deficit;Retrieval deficit Awareness: Impaired Awareness Impairment: Emergent impairment       Comprehension  Auditory Comprehension Overall Auditory Comprehension: Appears within functional limits for tasks assessed Yes/No Questions: Within Functional Limits Commands: Within Functional Limits Conversation: Simple Visual Recognition/Discrimination Discrimination: Not tested Reading Comprehension Reading Status: Not tested    Expression Expression Primary Mode of Expression: Verbal Verbal Expression Overall Verbal Expression: Appears within functional limits for tasks assessed Initiation: No impairment Automatic Speech: Name;Social Response;Day of week Level of Generative/Spontaneous Verbalization: Conversation Repetition: No impairment Naming: No impairment Pragmatics: No impairment Written Expression Dominant Hand: Right Written Expression: Not tested   Oral / Motor  Oral Motor/Sensory Function Overall Oral Motor/Sensory Function: Within functional limits Motor Speech Overall Motor Speech: Appears within functional limits for tasks assessed Respiration: Within functional limits Phonation: Normal Resonance: Within functional limits Articulation: Within functional limitis Intelligibility: Intelligible Motor Planning: Not tested Motor Speech Errors: Not applicable   GO                    Greggory Keen 06/14/2020, 2:33 PM

## 2020-06-14 NOTE — Progress Notes (Signed)
ANTICOAGULATION CONSULT NOTE - Follow Up Consult  Pharmacy Consult for heparin Indication: pulmonary embolus in setting of stroke  Labs: Recent Labs    06/12/20 0409 06/12/20 0409 06/13/20 0500 06/13/20 1947 06/14/20 0548  HGB 13.8   < > 14.8  --  14.4  HCT 42.3  --  44.7  --  41.5  PLT 264  --  313  --  363  HEPARINUNFRC  --   --   --  0.50 0.33  CREATININE 1.21  --  1.13  --   --    < > = values in this interval not displayed.    Assessment/Plan:  65yo male therapeutic on heparin after rate change. Will continue gtt at current rate and confirm stable with additional level.   Wynona Neat, PharmD, BCPS  06/14/2020,6:17 AM

## 2020-06-14 NOTE — Progress Notes (Signed)
Bilateral lower extremity venous duplex has been completed. Preliminary results can be found in CV Proc through chart review.  Results were given to the patient's nurse, Santiago Glad.  06/14/20 1:33 PM Carlos Levering RVT

## 2020-06-14 NOTE — Progress Notes (Addendum)
Patient ID: Ziair Penson, male   DOB: 05/15/1956, 64 y.o.   MRN: [id number]     Advanced Heart Failure Rounding Note  PCP-Cardiologist: Sanda Klein, MD   Subjective:    Noted to be more confused this am and w/ acute RUE weakness. CODE Stroke called. Found with incidental finding of lobar and segmental pulmonary emboli noted in bilateral upper lobes on neck CT. CT angio showed b/l multifocal PE with subacute to chronic thrombus in LLL pulmonary artery. MRI brain showed small acute infarcts with mild edema but no mass effect.   Today: Mr. Splawn is delayed in answer questions but is alert and oriented x 3.  He denies any complaints at present time.   Objective:   Weight Range: 88.2 kg Body mass index is 30.45 kg/m.   Vital Signs:   Temp:  [97.5 F (36.4 C)-98.3 F (36.8 C)] 98.1 F (36.7 C) (12/01 0752) Pulse Rate:  [30-104] 93 (12/01 0600) Resp:  [13-33] 24 (12/01 0600) BP: (135-171)/(62-119) 157/85 (12/01 0638) SpO2:  [89 %-98 %] 90 % (12/01 0638) Weight:  [88.2 kg] 88.2 kg (12/01 0500) Last BM Date: 06/12/20  Weight change: Filed Weights   06/12/20 0500 06/13/20 0500 06/14/20 0500  Weight: 90.1 kg 88.2 kg 88.2 kg    Intake/Output:   Intake/Output Summary (Last 24 hours) at 06/14/2020 0801 Last data filed at 06/14/2020 0600 Gross per 24 hour  Intake 617.26 ml  Output 1400 ml  Net -782.74 ml      Physical Exam    General:  Well appearing. No resp difficulty HEENT: normal.  Neck: supple. no JVD. Carotids 2+ bilat; no bruits. No lymphadenopathy or thryomegaly appreciated. Dressing to right sided of neck.  Cor: PMI nondisplaced. Regular rate & rhythm with occasional beat. No rubs, gallops or murmurs. Lungs: Diminished to lower bases.  Abdomen: soft, nontender, nondistended. No hepatosplenomegaly. No bruits or masses. Good bowel sounds. Extremities: no cyanosis, clubbing, rash, edema.  Neuro: alert & oriented x 3, cranial nerves grossly intact. Moves all 4  extremities w/o difficulty. Heel to shin intact. Affect pleasant.     Telemetry   SR w/ PVCs, rates 70-80s.  Personally reviewed.     Labs    CBC Recent Labs    06/13/20 0500 06/14/20 0548  WBC 14.5* 14.2*  HGB 14.8 14.4  HCT 44.7 41.5  MCV 92.0 88.7  PLT 313 416   Basic Metabolic Panel Recent Labs    06/13/20 0500 06/13/20 0500 06/13/20 1947 06/14/20 0548  NA 141  --   --  139  K 3.5  --   --  3.3*  CL 104  --   --  104  CO2 24  --   --  23  GLUCOSE 120*  --   --  127*  BUN 22  --   --  19  CREATININE 1.13  --   --  0.99  CALCIUM 8.5*  --   --  8.3*  MG 2.0   < > 1.8 1.9  PHOS 1.9*   < > 2.0* 2.1*   < > = values in this interval not displayed.   Liver Function Tests No results for input(s): AST, ALT, ALKPHOS, BILITOT, PROT, ALBUMIN in the last 72 hours. No results for input(s): LIPASE, AMYLASE in the last 72 hours. Cardiac Enzymes No results for input(s): CKTOTAL, CKMB, CKMBINDEX, TROPONINI in the last 72 hours.  BNP: BNP (last 3 results) Recent Labs    05/03/20 2330 05/04/20 0536  BNP 1,169.8* 1,408.4*    ProBNP (last 3 results) No results for input(s): PROBNP in the last 8760 hours.   D-Dimer No results for input(s): DDIMER in the last 72 hours. Hemoglobin A1C Recent Labs    06/14/20 0548  HGBA1C 5.8*   Fasting Lipid Panel Recent Labs    06/14/20 0548  CHOL 131  HDL 25*  LDLCALC 73  TRIG 165*  CHOLHDL 5.2   Thyroid Function Tests No results for input(s): TSH, T4TOTAL, T3FREE, THYROIDAB in the last 72 hours.  Invalid input(s): FREET3  Other results:   Imaging    CT Code Stroke CTA Head W/WO contrast  Result Date: 06/13/2020 CLINICAL DATA:  Right-sided weakness EXAM: CT ANGIOGRAPHY HEAD AND NECK CT PERFUSION BRAIN TECHNIQUE: Multidetector CT imaging of the head and neck was performed using the standard protocol during bolus administration of intravenous contrast. Multiplanar CT image reconstructions and MIPs were obtained to  evaluate the vascular anatomy. Carotid stenosis measurements (when applicable) are obtained utilizing NASCET criteria, using the distal internal carotid diameter as the denominator. Multiphase CT imaging of the brain was performed following IV bolus contrast injection. Subsequent parametric perfusion maps were calculated using RAPID software. CONTRAST:  170mL OMNIPAQUE IOHEXOL 350 MG/ML SOLN COMPARISON:  Noncontrast head CT from earlier today FINDINGS: CTA NECK FINDINGS Aortic arch: Delayed contrast bolus. No acute finding. There has been CABG. Atherosclerosis Right carotid system: Limited atheromatous changes without flow limiting stenosis, ulceration, or dissection. Left carotid system: Mixed density plaque at the proximal left ICA without ulceration, flow limiting stenosis, or dissection. Vertebral arteries: Limited assessment due to the poor bolus quality. The left vertebral artery is strongly dominant diffusely patent to the dura. The hypoplastic right vertebral artery is only faintly enhancing. No proximal subclavian stenosis. Skeleton: Cervical spine degeneration. Dental caries with periapical erosions. Other neck: No visible inflammation or incidental mass. Feeding tube is in place. Upper chest: Pulmonary embolism seen at the left lower lobar artery and in segmental right upper lobe branches. Especially the right upper lobe emboli appear branching and recent. Review of the MIP images confirms the above findings CTA HEAD FINDINGS Anterior circulation: Suboptimal bolus density. Calcified plaque at both carotid siphons without flow limiting stenosis seen. Negative for aneurysm. Negative for branch occlusion or beading. Posterior circulation: Atheromatous plaque of the left vertebral and basilar arteries. The distal left V4 segment there is moderate or advanced stenosis, measurement limited by poor bolus density. No branch occlusion, beading, or aneurysm. Venous sinuses: Not opacified. Anatomic variants: None  significant Review of the MIP images confirms the above findings CT Brain Perfusion Findings: ASPECTS: 10 CBF (<30%) Volume: 69mL Perfusion (Tmax>6.0s) volume: 15mL when accounting for artifactual signal at the superior sagittal sinus Critical Value/emergent results were called by telephone at the time of interpretation on 06/13/2020 at 9:30 am to provider [facility] , who verbally acknowledged these results. IMPRESSION: 1. No emergent large vessel occlusion or infarct/ischemia by perfusion. 2. Lobar and segmental pulmonary emboli incidentally noted in the upper lungs. 3. Minimal flow in the hypoplastic right vertebral artery. There is moderate to advanced atheromatous narrowing of the left V4 segment. 4. Carotid atherosclerosis without flow limiting stenosis or ulceration. 5. Suboptimal bolus. Electronically Signed   By: Monte Fantasia M.D.   On: 06/13/2020 09:33   CT Code Stroke CTA Neck W/WO contrast  Result Date: 06/13/2020 CLINICAL DATA:  Right-sided weakness EXAM: CT ANGIOGRAPHY HEAD AND NECK CT PERFUSION BRAIN TECHNIQUE: Multidetector CT imaging of the head and neck was  performed using the standard protocol during bolus administration of intravenous contrast. Multiplanar CT image reconstructions and MIPs were obtained to evaluate the vascular anatomy. Carotid stenosis measurements (when applicable) are obtained utilizing NASCET criteria, using the distal internal carotid diameter as the denominator. Multiphase CT imaging of the brain was performed following IV bolus contrast injection. Subsequent parametric perfusion maps were calculated using RAPID software. CONTRAST:  146mL OMNIPAQUE IOHEXOL 350 MG/ML SOLN COMPARISON:  Noncontrast head CT from earlier today FINDINGS: CTA NECK FINDINGS Aortic arch: Delayed contrast bolus. No acute finding. There has been CABG. Atherosclerosis Right carotid system: Limited atheromatous changes without flow limiting stenosis, ulceration, or dissection. Left carotid  system: Mixed density plaque at the proximal left ICA without ulceration, flow limiting stenosis, or dissection. Vertebral arteries: Limited assessment due to the poor bolus quality. The left vertebral artery is strongly dominant diffusely patent to the dura. The hypoplastic right vertebral artery is only faintly enhancing. No proximal subclavian stenosis. Skeleton: Cervical spine degeneration. Dental caries with periapical erosions. Other neck: No visible inflammation or incidental mass. Feeding tube is in place. Upper chest: Pulmonary embolism seen at the left lower lobar artery and in segmental right upper lobe branches. Especially the right upper lobe emboli appear branching and recent. Review of the MIP images confirms the above findings CTA HEAD FINDINGS Anterior circulation: Suboptimal bolus density. Calcified plaque at both carotid siphons without flow limiting stenosis seen. Negative for aneurysm. Negative for branch occlusion or beading. Posterior circulation: Atheromatous plaque of the left vertebral and basilar arteries. The distal left V4 segment there is moderate or advanced stenosis, measurement limited by poor bolus density. No branch occlusion, beading, or aneurysm. Venous sinuses: Not opacified. Anatomic variants: None significant Review of the MIP images confirms the above findings CT Brain Perfusion Findings: ASPECTS: 10 CBF (<30%) Volume: 11mL Perfusion (Tmax>6.0s) volume: 90mL when accounting for artifactual signal at the superior sagittal sinus Critical Value/emergent results were called by telephone at the time of interpretation on 06/13/2020 at 9:30 am to provider [facility] , who verbally acknowledged these results. IMPRESSION: 1. No emergent large vessel occlusion or infarct/ischemia by perfusion. 2. Lobar and segmental pulmonary emboli incidentally noted in the upper lungs. 3. Minimal flow in the hypoplastic right vertebral artery. There is moderate to advanced atheromatous narrowing of  the left V4 segment. 4. Carotid atherosclerosis without flow limiting stenosis or ulceration. 5. Suboptimal bolus. Electronically Signed   By: Monte Fantasia M.D.   On: 06/13/2020 09:33   CT ANGIO CHEST PE W OR WO CONTRAST  Result Date: 06/13/2020 CLINICAL DATA:  Pulmonary emboli on recent CTA of neck EXAM: CT ANGIOGRAPHY CHEST WITH CONTRAST TECHNIQUE: Multidetector CT imaging of the chest was performed using the standard protocol during bolus administration of intravenous contrast. Multiplanar CT image reconstructions and MIPs were obtained to evaluate the vascular anatomy. CONTRAST:  67mL OMNIPAQUE IOHEXOL 350 MG/ML SOLN COMPARISON:  CT of the neck from earlier in the same day. FINDINGS: Cardiovascular: Thoracic aorta demonstrates atherosclerotic calcifications and changes of prior coronary bypass grafting. Coronary calcifications are noted. No cardiac enlargement is noted. The pulmonary artery shows a normal branching pattern with significant bilateral pulmonary emboli the majority of which appears acute in nature. The changes in the lower lobe pulmonary artery on the left appear more subacute to chronic as there is apparent along the arterial wall. Mediastinum/Nodes: Thoracic inlet is within normal limits. No sizable hilar or mediastinal adenopathy is noted. Feeding catheter is noted extending into the distal stomach. The  esophagus is within normal limits. Lungs/Pleura: Small pleural effusions are noted. The lungs are well aerated with mild lower lobe atelectatic changes right greater than left. No sizable parenchymal nodule is seen. Upper Abdomen: Vicarious excretion of contrast is noted within the gallbladder. The remainder of the upper abdomen is within limits. Musculoskeletal: Degenerative changes of the thoracic spine are seen. Review of the MIP images confirms the above findings. IMPRESSION: Bilateral multifocal pulmonary emboli the majority of which appear acute in nature. Some subacute to chronic  thrombus is noted within the left lower lobe pulmonary artery. No right heart strain is noted. Bibasilar atelectatic changes with small effusions. Aortic Atherosclerosis (ICD10-I70.0). Electronically Signed   By: Inez Catalina M.D.   On: 06/13/2020 21:26   MR BRAIN WO CONTRAST  Result Date: 06/13/2020 CLINICAL DATA:  Stroke follow-up. EXAM: MRI HEAD WITHOUT CONTRAST TECHNIQUE: Multiplanar, multiecho pulse sequences of the brain and surrounding structures were obtained without intravenous contrast. COMPARISON:  Same day code stroke imaging. FINDINGS: Brain: Small acute infarcts involving the left frontal precentral gyrus (series 4, image 46) and the juxtacortical left frontal white matter (series 4, images 39 through 41). Mild associated without mass effect. Additional scattered T2/FLAIR hyperintensities in the white matter, compatible with chronic microvascular ischemic disease. Generalized cerebral volume loss with ex vacuo ventricular dilation. Remote lacunar infarct in right cerebellar hemisphere. No acute hemorrhage. No hydrocephalus. No mass lesion or abnormal mass effect. No extra-axial fluid collection. Vascular: Major arterial flow voids are maintained at the skull base. See same day CTA for further evaluation. Skull and upper cervical spine: Normal marrow signal. Sinuses/Orbits: Mild scattered paranasal sinus mucosal thickening without air-fluid levels. Unremarkable orbits. Other: Bilateral moderate mastoid effusions. IMPRESSION: 1. Small acute infarcts involving the left frontal precentral gyrus and the juxtacortical left frontal white matter. Mild associated edema without mass effect. 2. Chronic microvascular ischemic disease and generalized cerebral volume loss. Electronically Signed   By: Margaretha Sheffield MD   On: 06/13/2020 16:21   CT Code Stroke Cerebral Perfusion with contrast  Result Date: 06/13/2020 CLINICAL DATA:  Right-sided weakness EXAM: CT ANGIOGRAPHY HEAD AND NECK CT PERFUSION BRAIN  TECHNIQUE: Multidetector CT imaging of the head and neck was performed using the standard protocol during bolus administration of intravenous contrast. Multiplanar CT image reconstructions and MIPs were obtained to evaluate the vascular anatomy. Carotid stenosis measurements (when applicable) are obtained utilizing NASCET criteria, using the distal internal carotid diameter as the denominator. Multiphase CT imaging of the brain was performed following IV bolus contrast injection. Subsequent parametric perfusion maps were calculated using RAPID software. CONTRAST:  155mL OMNIPAQUE IOHEXOL 350 MG/ML SOLN COMPARISON:  Noncontrast head CT from earlier today FINDINGS: CTA NECK FINDINGS Aortic arch: Delayed contrast bolus. No acute finding. There has been CABG. Atherosclerosis Right carotid system: Limited atheromatous changes without flow limiting stenosis, ulceration, or dissection. Left carotid system: Mixed density plaque at the proximal left ICA without ulceration, flow limiting stenosis, or dissection. Vertebral arteries: Limited assessment due to the poor bolus quality. The left vertebral artery is strongly dominant diffusely patent to the dura. The hypoplastic right vertebral artery is only faintly enhancing. No proximal subclavian stenosis. Skeleton: Cervical spine degeneration. Dental caries with periapical erosions. Other neck: No visible inflammation or incidental mass. Feeding tube is in place. Upper chest: Pulmonary embolism seen at the left lower lobar artery and in segmental right upper lobe branches. Especially the right upper lobe emboli appear branching and recent. Review of the MIP images confirms the above  findings CTA HEAD FINDINGS Anterior circulation: Suboptimal bolus density. Calcified plaque at both carotid siphons without flow limiting stenosis seen. Negative for aneurysm. Negative for branch occlusion or beading. Posterior circulation: Atheromatous plaque of the left vertebral and basilar  arteries. The distal left V4 segment there is moderate or advanced stenosis, measurement limited by poor bolus density. No branch occlusion, beading, or aneurysm. Venous sinuses: Not opacified. Anatomic variants: None significant Review of the MIP images confirms the above findings CT Brain Perfusion Findings: ASPECTS: 10 CBF (<30%) Volume: 9mL Perfusion (Tmax>6.0s) volume: 50mL when accounting for artifactual signal at the superior sagittal sinus Critical Value/emergent results were called by telephone at the time of interpretation on 06/13/2020 at 9:30 am to provider [facility] , who verbally acknowledged these results. IMPRESSION: 1. No emergent large vessel occlusion or infarct/ischemia by perfusion. 2. Lobar and segmental pulmonary emboli incidentally noted in the upper lungs. 3. Minimal flow in the hypoplastic right vertebral artery. There is moderate to advanced atheromatous narrowing of the left V4 segment. 4. Carotid atherosclerosis without flow limiting stenosis or ulceration. 5. Suboptimal bolus. Electronically Signed   By: Monte Fantasia M.D.   On: 06/13/2020 09:33   CT HEAD CODE STROKE WO CONTRAST`  Result Date: 06/13/2020 CLINICAL DATA:  Code stroke. EXAM: CT HEAD WITHOUT CONTRAST TECHNIQUE: Contiguous axial images were obtained from the base of the skull through the vertex without intravenous contrast. COMPARISON:  CT head 03/24/2019 FINDINGS: Brain: Generalized atrophy most prominent the parietal lobes. Mild ventricular enlargement consistent with atrophy. Small hypodensity right inferior posterior cerebellum consistent with infarct. This was not present previously but is probably nonacute based on density. Negative for acute infarct, hemorrhage, mass. Vascular: Negative for hyperdense vessel. Atherosclerotic calcification in the distal left vertebral artery and basilar artery. Atherosclerotic calcification in the carotid artery bilaterally. Skull: Lytic lesions in the parietal bone  bilaterally, stable since 2014. Sinuses/Orbits: Paranasal sinuses clear. Periapical lucency around multiple upper teeth. Negative orbit Other: None ASPECTS ([place] Stroke Program Early CT Score) - Ganglionic level infarction (caudate, lentiform nuclei, internal capsule, insula, M1-M3 cortex): 7 - Supraganglionic infarction (M4-M6 cortex): 3 Total score (0-10 with 10 being normal): 10 IMPRESSION: 1. No acute abnormality.  Generalized atrophy 2. Hypodensity right posterior cerebellum most consistent with chronic infarct, not present in 2020. 3. ASPECTS is 10 4. Code stroke imaging results were communicated on 06/13/2020 at 9:00 am to provider Rory Percy via Shea Evans Electronically Signed   By: Franchot Gallo M.D.   On: 06/13/2020 09:02     Medications:     Scheduled Medications: .  stroke: mapping our early stages of recovery book   Does not apply Once  . amiodarone  200 mg Per Tube BID  . aspirin  81 mg Per Tube Daily  . atorvastatin  80 mg Per Tube q1800  . chlorhexidine  15 mL Mouth Rinse BID  . Chlorhexidine Gluconate Cloth  6 each Topical Daily  . Chlorhexidine Gluconate Cloth  6 each Topical Daily  . clopidogrel  75 mg Per Tube Daily  . digoxin  0.125 mg Per Tube Daily  . escitalopram  10 mg Per Tube Daily  . ezetimibe  10 mg Per Tube Daily  . feeding supplement (PROSource TF)  45 mL Per Tube BID  . lactulose  20 g Per Tube TID  . mouth rinse  15 mL Mouth Rinse q12n4p  . potassium & sodium phosphates  2 packet Per Tube Once  . potassium chloride  40 mEq Per Tube  Once  . sacubitril-valsartan  1 tablet Per Tube BID  . sodium chloride flush  10-40 mL Intracatheter Q12H  . sodium chloride flush  3 mL Intravenous Q12H  . spironolactone  25 mg Per Tube Daily    Infusions: . sodium chloride Stopped (06/10/20 1010)  . sodium chloride Stopped (06/10/20 1516)  . feeding supplement (VITAL 1.5 CAL) 30 mL/hr at 06/13/20 0555  . heparin 1,150 Units/hr (06/14/20 0743)  . magnesium sulfate bolus IVPB       PRN Medications: sodium chloride, sodium chloride, acetaminophen (TYLENOL) oral liquid 160 mg/5 mL, albuterol, guaiFENesin, haloperidol lactate, loperamide HCl, nitroGLYCERIN, ondansetron (ZOFRAN) IV, promethazine, sodium chloride flush, sodium chloride flush    Patient Profile   Sylis Ketchum a 64 y.o.malewith aPMH of CAD (s/p CABG in 2014 with LIMA-LAD, SVG-OM2 and SVG-RCA, s/p angioplasty to proximal SVG-RCA and DES to SVG-RCA anastomosisin 03/2019), chronic combined systolic and diastolic CHF/ICM (EF 76-28% by echo in 06/2019), HTN, HLD,prior CVAand recent STEMIwith angioplasty of the distal bypass graft10/21/21 d/c home with Life Vest.  Presented in VT requiring multiple amio bolus and lido but developed asystole.  Underwent external pacing and atropine then sent to Cath lab.      Assessment/Plan   1. VT arrest -> asystole - emergent cath 06/04/20 with stable coronary anatomy - suspect scar mediated VT - VT quiescent.  - Continue PO amiodarone 200 mg bid via NGT - TTVP discontinued 11/27, no further bradycardia.  - EP following. Will need dual chamber ICD prior to d/c likely on Friday.  - Keep K > 4.0 Mg > 2.0.   2. Acute on Chronic systolic HF with biventricular failure -> cardiogenic shock  - Echo 05/04/20 EF 25%. Moderate RV to severe RV dysfunction and possible LV thrombus. LifeVest placed. D/c'd home on 05/06/20 - RHC on admit with preserved output and well compensated filling pressures.  - Echo remains unchanged from October 2021, EF 25-30%, mod LVH, global hypokineses, aortic root dilatation 3.9 and ascending aorta 4.0 - TEE 11/24 EF 30-35% - Off NE and milrinone.  - Continue digoxin 0.125 mcg  - Increase Entresto dose 97-103 mg BID today (Spoke w/ Dr. Erlinda Hong regarding HTN, goal is to keep SBP 130-140, current SBP 171).  - Continue spironolactone 25 daily.   - Volume status ok, no loop diuretics currently   3. Acute ischemic CVA/ ? Component of  Encephalopathy - Stat Head CT 11/30 for AMS + RUE weakness showed no acute abnormality. Hypodensity right posterior cerebellum most consistent with chronic infarct. -MRI revealed scattered embolic infarcts in L hemisphere w/ some edema but no shift - LDL 73, HDL 25, Trig 165, Chol 131, A1c 5.8% - Neurology following and recommendations appreciated, discussed BP parameters (Goal SBP 130-140)  - also suspect multifactorial toxic metabolic encephalopathy - NH3- 46, given lactulose (BM overnight)  - U/A pending - frequent reorientation, neurochecks, PT/OT and SLP  - on ASA 81, plavix 75 mg, zetia 10, statin 80 and heparin gtt  4. Bilateral PE - incidental finding on neck CT, lobar and segmental pulmonary emboli noted in bilateral upper lobes - hemodynamically stable, O2 sats 95% on RA - per neurology, ok to start heparin. No bolus.   5. CAD  - s/p CABG  - Inferior STEMI 05/03/20 s/p POBA SVG->PDA - Emergent cath 06/04/20 with stable coronary anatomy - C/w DAPT/statin via NGT  - No chest pain.   6. Possible LV thrombus on echo 05/04/20 - Repeat echo apical filling defect suspicious for  LV thrombus vs false tendon -TEE 11/24 no LV thrombus - Resumed heparin gtt for PE and embolic nature of CVA  7. Acute hypoxic respiratory failure - intubated in setting of cardiac arrest - Extubated 11/23 at 14:22, required bipap then re-intubated 11/23 at 18:38 - CCM consulted to help manage vent and sedation, recommendations and assistance appreciated.  - Extubated 11/26.  8.  LL PNA (HCAP) - COVID, Flu negative; lactic acid 1.0 -> 1.3 -> 0.9 - PCT 0.22 -> 0.21 ->  0.31 -> 0.53 - Cultures normal flora  - Completed tx w/ Vanc + cefepime  9.  AKI - resolved  - baseline Cr on previous admission around 1.3 - Foley exchanged 11/23 d/t clot causing obstruction - Admission Cr 1.78, creatinine 0.99 today.   10.  Thrombocytopenia - Resolved  11. Dysphagia - Failed initial swallow evaluation -  Hoarse/quiet voice, ?vocal cord damage.  - Now w/ NGT for meds - SLP following  12. Deconditioning - OOB and in chair today  - mobilize w/ PT/OT    Length of Stay: [place], NP  06/14/2020, 8:01 AM  Advanced Heart Failure Team Pager [phone] (M-F; 7a - 4p)  Please contact [facility] for night-coverage after hours (4p -7a ) and weekends on [email or web address]  Patient seen and examined with the above-signed Advanced Practice Provider and/or Housestaff. I personally reviewed laboratory data, imaging studies and relevant notes. I independently examined the patient and formulated the important aspects of the plan. I have edited the note to reflect any of my changes or salient points. I have personally discussed the plan with the patient and/or family.  More alert today. RUE weakness resolved. Was able to pass swallow study. BP elevated. VT quiescent.   General:  Sitting up in bed No resp difficulty HEENT: normal + cortrak Neck: supple. no JVD. Carotids 2+ bilat; no bruits. No lymphadenopathy or thryomegaly appreciated. Cor: PMI nondisplaced. Regular rate & rhythm. No rubs, gallops or murmurs. Lungs: clear Abdomen: soft, nontender, nondistended. No hepatosplenomegaly. No bruits or masses. Good bowel sounds. Extremities: no cyanosis, clubbing, rash, edema Neuro: alert & orientedx3, cranial nerves grossly intact. moves all 4 extremities w/o difficulty. Affect pleasant  Stable from HF standpoint. BP high will increase Entresto. VT quiescent on amio. EP following for device implant. Remains on heparin for embolic CVA and pulmonary emboli. CT chest shows diffuse PE but he is hemodynamically stable and sats ok.  Glori Bickers, MD  6:03 PM

## 2020-06-14 NOTE — Progress Notes (Signed)
Rehab Admissions Coordinator Note:  Per PT recommendation, this patient was screened by Raechel Ache for appropriateness for an Inpatient Acute Rehab Consult.  At this time, we are recommending Inpatient Rehab consult. AC will contact MD to request order.   Raechel Ache 06/14/2020, 4:36 PM  I can be reached at [phone].

## 2020-06-14 NOTE — Progress Notes (Signed)
Physical Therapy Re-Evaluation and Treatment Patient Details Name: [name] MRN: [id number] DOB: 31-Dec-1955 Today's Date: 06/14/2020    History of Present Illness 64 yo with a hx as stated above who presented to [facility] 06/04/20 after having a pre-syncopal symptoms including dizzinezss while at church earlier today found to be in VT (not wearing Life Vest as prescribed on recent hospital discharge) on EMS arrival. Has not been compliant with LifeVest stating the battery pack too bulky. PMH of CAD (s/p CABG in 2014 with LIMA-LAD, SVG-OM2 and SVG-RCA, s/p angioplasty to proximal SVG-RCA and DES to SVG-RCA anastomosis in 03/2019), chronic combined systolic and diastolic CHF/ICM (EF 47-42% by echo in 06/2019), HTN, HLD, prior CVA and recent STEMI with angioplasty of the distal bypass graft 05/04/20. Admitted 06/04/20 for treatment of wide complex tachycardia. Intubated 11/21-Extubated 11/23 and reintubated extubated 11/26 R radial aline 11/23 TEE 11/24: EF 30-35% no evidence of LV clot.  11/30 pt noted to have increased R sided weakness by OT, code stroke called MRI revealed Small acute infarcts involving the left frontal precentral gyrus    PT Comments    Pt has had a change in status since Evaluation 11/29. Pt noted to have increased R sided weakness UE>LE and decreased coordination on R side>Lside. Pt safety awareness has also decreased. Pt requires constant cuing for completion of task before starting next task (ie walking and then sitting). Pt is now modA for bed mobility, transfers and ambulation of 5 feet from bed to wheelchair and wheelchair back to bed on 4E. Given change in pt status PT now recommending CIR level rehab prior to returning home. PT will continue to follow acutely.      Follow Up Recommendations  CIR     Equipment Recommendations  Rolling walker with 5" wheels    Recommendations for Other Services OT consult     Precautions / Restrictions Precautions Precautions:  None Restrictions Weight Bearing Restrictions: No    Mobility  Bed Mobility Overal bed mobility: Needs Assistance Bed Mobility: Supine to Sit;Sit to Supine     Supine to sit: Mod assist Sit to supine: Mod assist   General bed mobility comments: pt needs increased cuing to initiate movement, pt able to walk LE off bed, requires modA for bringing trunk to upright and pad scoot of hips to EoB, modA for bringing LE back into bed and squaring trunk to bed  Transfers Overall transfer level: Needs assistance Equipment used: Rolling walker (2 wheeled) Transfers: Sit to/from Stand Sit to Stand: Mod assist         General transfer comment: modA for power up and steady in RW. cues for upright posture and hand placement  Ambulation/Gait Ambulation/Gait assistance: Mod assist Gait Distance (Feet): 5 Feet Assistive device: Rolling walker (2 wheeled) Gait Pattern/deviations: Decreased step length - right;Decreased step length - left;Trunk flexed;Shuffle;Step-to pattern;Decreased weight shift to right;Decreased stance time - right Gait velocity: slowed Gait velocity interpretation: <1.31 ft/sec, indicative of household ambulator General Gait Details: pt with very poor coordination, constant multimodal cuing for movement especially of R side, tries to start sitting before he arrives at target      Modified Rankin (Stroke Patients Only) Modified Rankin (Stroke Patients Only) Pre-Morbid Rankin Score: No symptoms Modified Rankin: Moderately severe disability     Balance Overall balance assessment: Needs assistance Sitting-balance support: Feet supported Sitting balance-Leahy Scale: Fair     Standing balance support: Bilateral upper extremity supported Standing balance-Leahy Scale: Poor Standing balance comment: requires bilateral UE and  outside support for steadying                             Cognition Arousal/Alertness: Awake/alert Behavior During Therapy: Impulsive  (pt tries to short sit needs constant cuing to stay upright ) Overall Cognitive Status: Impaired/Different from baseline Area of Impairment: Orientation;Following commands;Safety/judgement;Awareness;Problem solving                 Orientation Level: Disoriented to;Situation   Memory: Decreased short-term memory Following Commands: Follows one step commands with increased time;Follows multi-step commands inconsistently Safety/Judgement: Decreased awareness of safety;Decreased awareness of deficits Awareness: Emergent Problem Solving: Slow processing;Difficulty sequencing;Requires verbal cues;Requires tactile cues General Comments: pt requires multimodal cuing for safety, unaware of his increased weakness       Exercises      General Comments General comments (skin integrity, edema, etc.): HR 90s with ambulation, 3/4 DoE with mobility SaO2 >90%O2 on RA      Pertinent Vitals/Pain Pain Assessment: No/denies pain    Home Living     Available Help at Discharge: Family;Available 24 hours/day Type of Home: Mobile home                  PT Goals (current goals can now be found in the care plan section) Acute Rehab PT Goals Patient Stated Goal: go home  PT Goal Formulation: With patient Time For Goal Achievement: 06/26/20 Potential to Achieve Goals: Good Progress towards PT goals: Not progressing toward goals - comment (decreased R sided strength )    Frequency    Min 3X/week      PT Plan Discharge plan needs to be updated    Co-evaluation              AM-PAC PT "6 Clicks" Mobility   Outcome Measure  Help needed turning from your back to your side while in a flat bed without using bedrails?: None Help needed moving from lying on your back to sitting on the side of a flat bed without using bedrails?: A Little Help needed moving to and from a bed to a chair (including a wheelchair)?: A Little Help needed standing up from a chair using your arms (e.g.,  wheelchair or bedside chair)?: A Little Help needed to walk in hospital room?: A Lot Help needed climbing 3-5 steps with a railing? : Total 6 Click Score: 16    End of Session Equipment Utilized During Treatment: Gait belt Activity Tolerance: Patient tolerated treatment well Patient left: in chair;with call bell/phone within reach;with chair alarm set Nurse Communication: Mobility status PT Visit Diagnosis: Unsteadiness on feet (R26.81);Other abnormalities of gait and mobility (R26.89);Muscle weakness (generalized) (M62.81);Difficulty in walking, not elsewhere classified (R26.2)     Time: 3300-7622 PT Time Calculation (min) (ACUTE ONLY): 19 min  Charges:   1- Re-Evaluation                      Janara Klett B. Migdalia Dk PT, DPT Acute Rehabilitation Services Pager [phone] Office [phone]    [place] 06/14/2020, 3:18 PM

## 2020-06-14 NOTE — Progress Notes (Signed)
STROKE TEAM PROGRESS NOTE   INTERVAL HISTORY Wife at bedside.  Patient lying in bed, awake alert, mildly lethargic, fully orientated and cooperative with exam.  Slight right hand gripping weakness, otherwise neurologically intact.  MRI found left frontal paracentral gyrus and left frontal white matter small infarcts.  He was also found to have bilateral PE, and low EF.  Currently on heparin IV.  Vitals:   06/14/20 0600 06/14/20 0638 06/14/20 0752 06/14/20 0800  BP:  (!) 157/85  (!) 167/114  Pulse: 93   87  Resp: (!) 24   (!) 28  Temp:   98.1 F (36.7 C)   TempSrc:   Oral   SpO2: (!) 89% 90%  90%  Weight:      Height:       CBC:  Recent Labs  Lab 06/13/20 0500 06/14/20 0548  WBC 14.5* 14.2*  HGB 14.8 14.4  HCT 44.7 41.5  MCV 92.0 88.7  PLT 313 124   Basic Metabolic Panel:  Recent Labs  Lab 06/13/20 0500 06/13/20 0500 06/13/20 1947 06/14/20 0548  NA 141  --   --  139  K 3.5  --   --  3.3*  CL 104  --   --  104  CO2 24  --   --  23  GLUCOSE 120*  --   --  127*  BUN 22  --   --  19  CREATININE 1.13  --   --  0.99  CALCIUM 8.5*  --   --  8.3*  MG 2.0   < > 1.8 1.9  PHOS 1.9*   < > 2.0* 2.1*   < > = values in this interval not displayed.   Lipid Panel:  Recent Labs  Lab 06/14/20 0548  CHOL 131  TRIG 165*  HDL 25*  CHOLHDL 5.2  VLDL 33  LDLCALC 73   HgbA1c:  Recent Labs  Lab 06/14/20 0548  HGBA1C 5.8*   Urine Drug Screen: No results for input(s): LABOPIA, COCAINSCRNUR, LABBENZ, AMPHETMU, THCU, LABBARB in the last 168 hours.  Alcohol Level No results for input(s): ETH in the last 168 hours.  IMAGING past 24 hours CT ANGIO CHEST PE W OR WO CONTRAST  Result Date: 06/13/2020 CLINICAL DATA:  Pulmonary emboli on recent CTA of neck EXAM: CT ANGIOGRAPHY CHEST WITH CONTRAST TECHNIQUE: Multidetector CT imaging of the chest was performed using the standard protocol during bolus administration of intravenous contrast. Multiplanar CT image reconstructions and MIPs  were obtained to evaluate the vascular anatomy. CONTRAST:  2mL OMNIPAQUE IOHEXOL 350 MG/ML SOLN COMPARISON:  CT of the neck from earlier in the same day. FINDINGS: Cardiovascular: Thoracic aorta demonstrates atherosclerotic calcifications and changes of prior coronary bypass grafting. Coronary calcifications are noted. No cardiac enlargement is noted. The pulmonary artery shows a normal branching pattern with significant bilateral pulmonary emboli the majority of which appears acute in nature. The changes in the lower lobe pulmonary artery on the left appear more subacute to chronic as there is apparent along the arterial wall. Mediastinum/Nodes: Thoracic inlet is within normal limits. No sizable hilar or mediastinal adenopathy is noted. Feeding catheter is noted extending into the distal stomach. The esophagus is within normal limits. Lungs/Pleura: Small pleural effusions are noted. The lungs are well aerated with mild lower lobe atelectatic changes right greater than left. No sizable parenchymal nodule is seen. Upper Abdomen: Vicarious excretion of contrast is noted within the gallbladder. The remainder of the upper abdomen is within limits. Musculoskeletal:  Degenerative changes of the thoracic spine are seen. Review of the MIP images confirms the above findings. IMPRESSION: Bilateral multifocal pulmonary emboli the majority of which appear acute in nature. Some subacute to chronic thrombus is noted within the left lower lobe pulmonary artery. No right heart strain is noted. Bibasilar atelectatic changes with small effusions. Aortic Atherosclerosis (ICD10-I70.0). Electronically Signed   By: Inez Catalina M.D.   On: 06/13/2020 21:26   MR BRAIN WO CONTRAST  Result Date: 06/13/2020 CLINICAL DATA:  Stroke follow-up. EXAM: MRI HEAD WITHOUT CONTRAST TECHNIQUE: Multiplanar, multiecho pulse sequences of the brain and surrounding structures were obtained without intravenous contrast. COMPARISON:  Same day code stroke  imaging. FINDINGS: Brain: Small acute infarcts involving the left frontal precentral gyrus (series 4, image 46) and the juxtacortical left frontal white matter (series 4, images 39 through 41). Mild associated without mass effect. Additional scattered T2/FLAIR hyperintensities in the white matter, compatible with chronic microvascular ischemic disease. Generalized cerebral volume loss with ex vacuo ventricular dilation. Remote lacunar infarct in right cerebellar hemisphere. No acute hemorrhage. No hydrocephalus. No mass lesion or abnormal mass effect. No extra-axial fluid collection. Vascular: Major arterial flow voids are maintained at the skull base. See same day CTA for further evaluation. Skull and upper cervical spine: Normal marrow signal. Sinuses/Orbits: Mild scattered paranasal sinus mucosal thickening without air-fluid levels. Unremarkable orbits. Other: Bilateral moderate mastoid effusions. IMPRESSION: 1. Small acute infarcts involving the left frontal precentral gyrus and the juxtacortical left frontal white matter. Mild associated edema without mass effect. 2. Chronic microvascular ischemic disease and generalized cerebral volume loss. Electronically Signed   By: Margaretha Sheffield MD   On: 06/13/2020 16:21    PHYSICAL EXAM  Temp:  [97.8 F (36.6 C)-98.5 F (36.9 C)] 98 F (36.7 C) (12/01 1654) Pulse Rate:  [30-104] 78 (12/01 1654) Resp:  [20-32] 20 (12/01 1654) BP: (132-179)/(81-121) 142/98 (12/01 1654) SpO2:  [89 %-95 %] 93 % (12/01 1654) Weight:  [88.2 kg] 88.2 kg (12/01 0500)  General - Well nourished, well developed, in no apparent distress, mildly lethargic.  Ophthalmologic - fundi not visualized due to noncooperation.  Cardiovascular - Regular rhythm and rate, not in A. fib.  Mental Status -  Level of arousal and orientation to time, place, and person were intact. Language including expression, naming, repetition, comprehension was assessed and found intact.  Cranial Nerves  II - XII - II - Visual field intact OU. III, IV, VI - Extraocular movements intact. V - Facial sensation intact bilaterally. VII - Facial movement intact bilaterally. VIII - Hearing & vestibular intact bilaterally. X - Palate elevates symmetrically. XI - Chin turning & shoulder shrug intact bilaterally. XII - Tongue protrusion intact.  Motor Strength - The patient's strength was normal in all extremities and pronator drift was absent except mild weakness on finger grip on the right.  Bulk was normal and fasciculations were absent.   Motor Tone - Muscle tone was assessed at the neck and appendages and was normal.  Reflexes - The patient's reflexes were symmetrical in all extremities and he had no pathological reflexes.  Sensory - Light touch, temperature/pinprick were assessed and were symmetrical.    Coordination - The patient had normal movements in the hands with no ataxia or dysmetria.  Tremor was absent.  Gait and Station - deferred.   ASSESSMENT/PLAN [name] is a 64 y.o. male with history of coronary artery disease, hypertension, dyslipidemia, STEMI's, stroke in the past with unclear residual deficits, hx LV  thrombus treated w/ [facility], currently admitted after a V. tach cardiac arrest after experiencing dizziness while at church on 06/04/2020 not wearing his LifeVest as had been prescribed on a prior recent hospital discharge, admitted to the cardiac ICU where he was noted that he had some right-sided weakness 06/13/2020 in the morning by the occupational therapist. Chart states he had been acting somewhat confused since the day prior. Rapid response was called.  On stroke on evaluation, symptoms are rapidly improving-he did have some right-sided hand weakness, no drift on the right arm but subjectively felt weak.  He was also confused intermittently. A code stroke was activated-last known normal is unclear- not a tPA or IR candidate.   Stroke:   Small L MCA infarcts in setting  of cardiomyopathy with low EF and recent LV thrombus.  Code Stroke CT head No acute abnormality. R posterior cerebellar hypodensity. ASPECTS 10.     CTA head & neck no ELVO. Incidental lobar and segmental PE upper lungs. Minimal flor R VA. Moderate narrowing L V4. (suboptimal bolus)  CT perfusion no core or penumbra  MRI  Small L frontal precentral gyrus and juxtacortical L frontal white matter infarcts w/ mild edema. Small vessel disease. Atrophy.   LE Doppler bilateral distal DVTs  2D Echo w/ bubble EF 25-30%, RV mildly large. Neg bubble  TEE 11/24 neg for LV thrombus; EF 30-35%.Inferolateral HK, no PFO or IA shunt  Plan for pacemaker Friday  LDL 73  HgbA1c 5.8  VTE prophylaxis - IV heparin  aspirin 81 mg daily and clopidogrel 75 mg daily prior to admission, now on aspirin 81 mg daily, clopidogrel 75 mg daily and heparin IV.  Plan to transition to Eliquis Sunday night per cardiology  Therapy recommendations:  pending   Disposition:  pending   Acute PE  Seen on CTA neck and CTA chest. Thrombus in LLL pulm artery.  LE venous Doppler bilateral distal DVTs  On IV heparin  Plan transition to Eliquis Sunday night   Hypertension  Stable . BP goal < 180/105 given on AC . Avoid low BP . Long-term BP goal normotensive  Hyperlipidemia  Home meds:  zetia 10 and fish oil  Zetia resumed in hospital  LDL 73, goal < 70  On Lipitor 80  Continue statin on discharge  Dysphagia . Has Cortrak, TF . NPO -> now passed swallow . On diet . Speech on board   Other Stroke Risk Factors  Advanced Age >/= 48   Former Cigarette smoker  Former smokeless tobacco user  Obesity, Body mass index is 30.45 kg/m., BMI >/= 30 associated with increased stroke risk, recommend weight loss, diet and exercise as appropriate   Hx stroke/TIA  08/4266 - small R PLIC infarct d/t small vessel disease. ASA and enrolled in antiplt research study (Sethi saw, details not in  Epic)  Coronary artery disease s/p CABG in 2014, recent MI 05/03/2020 d/t missed ticagrelor  Chronic systolic and diastolic Congestive heart failure / ICM. EF as low as 25%  Hx Possible LV clot (TEE neg this admission)  Other Active Problems  Acute hypoxic respiratory failure w/ small pleural effusion, extubated 11/23  Recurrent VT possibly scar or brachycardia mediated  Underlying sinus node dysfunction  Cardiogenic shock  Nausea secondary to exaggerated gag response secondary to stroke and recent intubation  Leukocytosis/ fever presumed HCAP. Completed 7d course abx  AKI, resolved  [facility] day # 10  Neurology will sign off. Please call with questions. Pt  will follow up with stroke clinic NP at [id number] [facility] in about 4 weeks. Thanks for the consult.  Rosalin Hawking, MD PhD Stroke Neurology 06/14/2020 7:38 PM  To contact Stroke Continuity provider, please refer to [email or web address] After hours, contact General Neurology

## 2020-06-14 NOTE — Progress Notes (Signed)
NAME:  [name], MRN:  [id number], DOB:  08/24/1955, LOS: 102 ADMISSION DATE:  06/04/2020, CONSULTATION DATE:  06/04/2020 REFERRING MD:  Dr. Haroldine Laws, CHIEF COMPLAINT:  VT cardiac arrest  Brief History   64 yoM with extensive cardiac history with recent STEMI 04/2020, with recurrent VT who developed severe bradycardia on amiodarone and lidocaine gtt requiring external pacing.  Intubated and emergently taken to cath lab for TVP, PA catheter, and R/LHC.  PCCM consulted for ventilator management.   History of present illness   HPI obtained from medical chart review as patient is sedated and intubated on mechanical ventilation.   64 year old male with history of CAD w/ previous CABG in 2014, chronic combined systolic and diastolic HF/ ICM, HTN, HLD, CVA, and recent inferior STEMI 05/03/20 secondary to thrombotic occlusion of the SVG to the RCA, likely due to missed ticagrelor doses.  EF noted to be 25% with moderate RV to severe RV dysfunction and possible LV thrombus 05/04/2020.  He was discharged home on 10/23 on a LifeVest, but apparently not using.    On 11/21, he developed dizziness at church.  EMS found patient in wide complex tachycardia treated with amiodarone and lidocaine with successful conversion to NSR.  On arrival to ER, patient had recurrence of his VT but hemodynamically tolerating.  He was given additional amiodarone and lidocaine gtt started, however developed severe bradycardia requiring atropine and external pacing.  He was intubated and taken emergently for temporary transvenous pacemaker and coronary angiogram.  Returns to ICU sedated and intubated, PCCM consulted for further ventilator management.   Past Medical History  CAD (s/p CABG in 2014 with LIMA-LAD, SVG-OM2 and SVG-RCA, s/p angioplasty to proximal SVG-RCA and DES to SVG-RCA anastomosisin 03/2019), chronic combined systolic and diastolic CHF/ICM (EF 02-54% by echo in 06/2019), HTN, HLD,prior CVA  Significant  Hospital Events   11/21 Admitted cardiology/ intubated/ L/RHC, TVP, PA catheter 11/22  Remains on NE, amio, heparin gtt, decreased UOP.  CVP 8, CI 1.34, co-ox 60-66%, started on milrinone 11/23 Tmax 101.7, w/ increased respiratory secretions prompted the initiation of antibiotics. co-ox 72%, CVP 12, CI 3.2, SVR 1090. Able to titrate off sedation and decrease NE; extubated 14:22 then re-intubated at 18:38.    Consults:  HF CCM EP Neuro  Procedures:  11/21 ETT >> 11/23 11/21 R IJ cordis with PA catheter >> 11/21 R femoral TVP >> 11/21 R radial aline >>  Significant Diagnostic Tests:  11/21 R/LHC >   Severe 3 vessel occlusive CAD. 2. Patent LIMA to the LAD 3. Patent SVG to OM1 4. Patent SVG to RCA. Much improved flow from prior PCI. 5. Mildly elevated LVEDP 17 mm Hg 6. Successful placement of temporary transvenous pacemaker.  11/24 TEE >> neg for LV thrombus; EF 30-35%. Inferolateral HK  11/30 CTH >> 1. No acute abnormality.  Generalized atrophy 2. Hypodensity right posterior cerebellum most consistent with chronic infarct, not present in 2020. 3. ASPECTS is 10  11/30 CTA head/ neck/ perfusion >> 1. No emergent large vessel occlusion or infarct/ischemia by perfusion. 2. Lobar and segmental pulmonary emboli incidentally noted in the upper lungs. 3. Minimal flow in the hypoplastic right vertebral artery. There is moderate to advanced atheromatous narrowing of the left V4 segment. 4. Carotid atherosclerosis without flow limiting stenosis or ulceration. 5. Suboptimal bolus.  11/30 CT head>>no acute findings 11/30 CTA head/neck>>no LVO 11/30 MRI brain>>Small acute infarcts involving the left frontal precentral gyrus and the juxtacortical left frontal white matter. Mild associated  edema without mass effect 11/30 CT chest>>Bilateral multifocal pulmonary emboli the majority of which appear acute in nature. Some subacute to chronic thrombus is noted within the left lower lobe pulmonary  artery.    Micro Data:  11/21 SARS2/ flu >>neg 11/21 MRSA PCR >>neg 11/22 BCx 2 >> neg 11/23 sputum >> normal flora  Antimicrobials:  11/23 cefepime >> 11/29 11/24 vanc >> 11/24  Interim history/subjective:  On heparin gtt, awake, calm no complaints, UOP 1400cc in last 24hrs, still net positive 1,629cc  Objective   Blood pressure (!) 157/85, pulse 93, temperature 98.1 F (36.7 C), temperature source Oral, resp. rate (!) 24, height 5\' 7"  (1.702 m), weight 88.2 kg, SpO2 90 %.        Intake/Output Summary (Last 24 hours) at 06/14/2020 0843 Last data filed at 06/14/2020 0600 Gross per 24 hour  Intake 617.26 ml  Output 1400 ml  Net -782.74 ml   Filed Weights   06/12/20 0500 06/13/20 0500 06/14/20 0500  Weight: 90.1 kg 88.2 kg 88.2 kg   Examination: General:  Chronically ill appearing older male in bed in NAD-calm and answering questions appropriately  HEENT: MM pink/moist, clear voice, no facial deficits, sclera anicteric Neuro: Awake oriented to person and place, moving all extremities CV: rrr, NSR no murmur PULM:  Non labored, clear anteriorly, slightly decreased air entry bilateral bases GI: soft, NT, +bs Extremities: warm/dry, no LE edema  Skin: no rashes  Resolved Hospital Problem list   Possible LV thrombus-TEE negative Thrombocytopenia AKI  Assessment & Plan:   Recurrent VT possibly scar or bradycardia mediated Underlying sinus node dysfunction P: - EP primary, plan to continue oral amio and digoxin  - Will eventually need ICD prior to discharge, hopefully by Friday  Cardiogenic shock Chronic systolic/ diastolic HF with biventricular failure. P: - Per HF team, continue digoxin, entresto, aldactone - strict I/Os, daily wts  CAD s/p CABG with recent inferior STEMI 04/2020 HTN P: - continue Asa, Lipitor, Plavix, Zetia  Acute hypoxic respiratory failure with small pleural effusion Dysphagia  Extubated 11/23 - Continue to wean oxygen as tolerated -  Pulmonary hygiene- IS, PT, moblize, CPT, NT prn  - aspiration precautions; continue cortrak for EN/ meds; SLP following  Nausea secondary to exaggerated gag response secondary to stroke and recent intubation Improved, remains NPO for pacemaker insertion - SLP to evaluate - Nasogastric tube for medications.  May need a period of nasogastric feeding.  Leukocytosis/ fever presumed HCAP - Complete 7 days of Cefepime 11/29; cultures negative/ normal flora; continue to monitor clinically  AKI- resolved - trend BMET, renal dose meds and avoid nephrotoxins - strict I/Os  Encephalopathy , Acute CVA-Embolic L infarcts Seen by Neurology P: -serial neuro exams -Echocardiogram with bubble -heparin gtt -follow up by stroke tream - delirium precautions - pending ammonia/ UA  Bilateral lobar and segmental Pulmonary Embolism - incidental findings on stroke workup  - remains hemodynamically stable and on 2L [place] - bilateral venous dopplers to evaluate for DVT pending - depending on neuro recommendations, will need anticoagulation- likely heparin gtt for now and eventual DOAC  Deconditioning - PT/OT  Best practice:  Diet: NPO; SLP following Pain/Anxiety/Delirium protocol (if indicated): n/a VAP protocol (if indicated): n/a DVT prophylaxis: lovenox GI prophylaxis: PPI Glucose control: add SSI if > 180 Mobility: Progressive ambulation, PT/ OT/ SLP Code Status: full  Family Communication: per primary  Disposition: continue in ICU  Labs   CBC: Recent Labs  Lab 06/10/20 0518 06/11/20 0331 06/12/20 0409  06/13/20 0500 06/14/20 0548  WBC 11.0* 12.3* 14.3* 14.5* 14.2*  HGB 12.9* 13.7 13.8 14.8 14.4  HCT 40.0 43.3 42.3 44.7 41.5  MCV 94.6 94.5 93.6 92.0 88.7  PLT 179 222 264 313 242    Basic Metabolic Panel: Recent Labs  Lab 06/10/20 0518 06/10/20 0945 06/11/20 0331 06/11/20 0331 06/12/20 0409 06/12/20 1709 06/13/20 0500 06/13/20 1947 06/14/20 0548  NA  --  138 139  --  139   --  141  --  139  K  --  4.1 4.1  --  3.5  --  3.5  --  3.3*  CL  --  107 106  --  107  --  104  --  104  CO2  --  22 23  --  24  --  24  --  23  GLUCOSE  --  106* 106*  --  94  --  120*  --  127*  BUN  --  19 17  --  19  --  22  --  19  CREATININE  --  1.27* 1.20  --  1.21  --  1.13  --  0.99  CALCIUM  --  8.1* 8.3*  --  8.0*  --  8.5*  --  8.3*  MG   < >  --  1.8   < > 2.1 1.9 2.0 1.8 1.9  PHOS  --   --   --   --   --  1.5* 1.9* 2.0* 2.1*   < > = values in this interval not displayed.   GFR: Estimated Creatinine Clearance: 77.8 mL/min (by C-G formula based on SCr of 0.99 mg/dL). Recent Labs  Lab 06/08/20 0530 06/09/20 0331 06/11/20 0331 06/12/20 0409 06/13/20 0500 06/14/20 0548  PROCALCITON 0.53  --   --   --   --   --   WBC 11.8*   < > 12.3* 14.3* 14.5* 14.2*   < > = values in this interval not displayed.    Liver Function Tests: No results for input(s): AST, ALT, ALKPHOS, BILITOT, PROT, ALBUMIN in the last 168 hours. No results for input(s): LIPASE, AMYLASE in the last 168 hours. Recent Labs  Lab 06/13/20 1124  AMMONIA 46*    ABG    Component Value Date/Time   PHART 7.331 (L) 06/06/2020 2036   PCO2ART 45.2 06/06/2020 2036   PO2ART 179 (H) 06/06/2020 2036   HCO3 24.0 06/06/2020 2036   TCO2 25 06/06/2020 2036   ACIDBASEDEF 2.0 06/06/2020 2036   O2SAT 70.9 06/12/2020 0409     Coagulation Profile: No results for input(s): INR, PROTIME in the last 168 hours.  Cardiac Enzymes: No results for input(s): CKTOTAL, CKMB, CKMBINDEX, TROPONINI in the last 168 hours.  HbA1C: Hgb A1c MFr Bld  Date/Time Value Ref Range Status  06/14/2020 05:48 AM 5.8 (H) 4.8 - 5.6 % Final    Comment:    (NOTE) Pre diabetes:          5.7%-6.4%  Diabetes:              >6.4%  Glycemic control for   <7.0% adults with diabetes   05/03/2020 11:30 PM 6.1 (H) 4.8 - 5.6 % Final    Comment:    (NOTE)         Prediabetes: 5.7 - 6.4         Diabetes: >6.4         Glycemic control for  adults with diabetes: <7.0  CBG: Recent Labs  Lab 06/13/20 1125 06/13/20 2028 06/14/20 0001 06/14/20 0510 06/14/20 0749  GLUCAP 121* 109* 132* 126* 116*    CRITICAL CARE Performed by: Otilio Carpen Hargis Vandyne   Total critical care time: 40 minutes  Critical care time was exclusive of separately billable procedures and treating other patients.  Critical care was necessary to treat or prevent imminent or life-threatening deterioration.  Critical care was time spent personally by me on the following activities: development of treatment plan with patient and/or surrogate as well as nursing, discussions with consultants, evaluation of patient's response to treatment, examination of patient, obtaining history from patient or surrogate, ordering and performing treatments and interventions, ordering and review of laboratory studies, ordering and review of radiographic studies, pulse oximetry and re-evaluation of patient's condition.  Otilio Carpen Elizabethann Lackey, PA-C [facility]  Pager# [phone], if no answer [phone]

## 2020-06-14 NOTE — Progress Notes (Signed)
Progress Note  Patient Name: [name] Date of Encounter: 06/14/2020  [facility] Cardiologist: Sanda Klein, MD   Subjective   In bed, confused, asking for his wife, Dr. Quentin Ore was able to re-orient him, discussed plans for today  Inpatient Medications    Scheduled Meds: .  stroke: mapping our early stages of recovery book   Does not apply Once  . amiodarone  200 mg Per Tube BID  . aspirin  81 mg Per Tube Daily  . atorvastatin  80 mg Per Tube q1800  . chlorhexidine  15 mL Mouth Rinse BID  . Chlorhexidine Gluconate Cloth  6 each Topical Daily  . Chlorhexidine Gluconate Cloth  6 each Topical Daily  . clopidogrel  75 mg Per Tube Daily  . digoxin  0.125 mg Per Tube Daily  . escitalopram  10 mg Per Tube Daily  . ezetimibe  10 mg Per Tube Daily  . feeding supplement (PROSource TF)  45 mL Per Tube BID  . lactulose  20 g Per Tube TID  . mouth rinse  15 mL Mouth Rinse q12n4p  . potassium & sodium phosphates  2 packet Per Tube Once  . potassium chloride  40 mEq Per Tube Once  . sacubitril-valsartan  1 tablet Per Tube BID  . sodium chloride flush  10-40 mL Intracatheter Q12H  . sodium chloride flush  3 mL Intravenous Q12H  . spironolactone  25 mg Per Tube Daily   Continuous Infusions: . sodium chloride Stopped (06/10/20 1010)  . sodium chloride Stopped (06/10/20 1516)  . feeding supplement (VITAL 1.5 CAL) 30 mL/hr at 06/13/20 0555  . heparin 1,150 Units/hr (06/14/20 0743)  . magnesium sulfate bolus IVPB     PRN Meds: sodium chloride, sodium chloride, acetaminophen (TYLENOL) oral liquid 160 mg/5 mL, albuterol, guaiFENesin, haloperidol lactate, loperamide HCl, nitroGLYCERIN, ondansetron (ZOFRAN) IV, promethazine, sodium chloride flush, sodium chloride flush   Vital Signs    Vitals:   06/14/20 0500 06/14/20 0600 06/14/20 0638 06/14/20 0752  BP: (!) 150/92  (!) 157/85   Pulse: 78 93    Resp: (!) 25 (!) 24    Temp:    98.1 F (36.7 C)  TempSrc:    Oral  SpO2: 90%  (!) 89% 90%   Weight: 88.2 kg     Height:        Intake/Output Summary (Last 24 hours) at 06/14/2020 0752 Last data filed at 06/14/2020 0600 Gross per 24 hour  Intake 617.26 ml  Output 1400 ml  Net -782.74 ml   Last 3 Weights 06/14/2020 06/13/2020 06/12/2020  Weight (lbs) 194 lb 7.1 oz 194 lb 7.1 oz 198 lb 10.2 oz  Weight (kg) 88.2 kg 88.2 kg 90.1 kg      Telemetry    SR, generally 70's, occ PVCs- Personally Reviewed  ECG    No new EKGs - Personally Reviewed  Physical Exam   GEN: somewhat chronically ill appearing Neck: No JVD Cardiac: RRR, no murmurs, rubs, or gallops Respiratory: decreased at the bases, no wheezing GI: Soft, nontender  MS: trace edema; No deformity. Neuro:  confused, able to re-orient some   Psych: very pleasent  Labs    High Sensitivity Troponin:   Recent Labs  Lab 06/04/20 1148  TROPONINIHS 157*      Chemistry Recent Labs  Lab 06/12/20 0409 06/13/20 0500 06/14/20 0548  NA 139 141 139  K 3.5 3.5 3.3*  CL 107 104 104  CO2 24 24 23   GLUCOSE 94 120*  127*  BUN 19 22 19   CREATININE 1.21 1.13 0.99  CALCIUM 8.0* 8.5* 8.3*  GFRNONAA >60 >60 >60  ANIONGAP 8 13 12      Hematology Recent Labs  Lab 06/12/20 0409 06/13/20 0500 06/14/20 0548  WBC 14.3* 14.5* 14.2*  RBC 4.52 4.86 4.68  HGB 13.8 14.8 14.4  HCT 42.3 44.7 41.5  MCV 93.6 92.0 88.7  MCH 30.5 30.5 30.8  MCHC 32.6 33.1 34.7  RDW 12.5 12.6 12.7  PLT 264 313 363    BNPNo results for input(s): BNP, PROBNP in the last 168 hours.   DDimer No results for input(s): DDIMER in the last 168 hours.   Radiology    06/07/20: CXR  IMPRESSION: 1. Stable line positioning with stable left basilar atelectasis and/or infiltrate. 2. Very small, stable left pleural effusion.   Cardiac Studies   06/05/2020: TTE IMPRESSIONS  1. Technically difficult study with poor echo windows. Definity contrast  given.  2. Left ventricular ejection fraction, by estimation, is 25 to 30%. The   left ventricle has severely decreased function. The left ventricle  demonstrates global hypokinesis with regional variation including apical  dyskineisis. There is moderate left  ventricular hypertrophy. Left ventricular diastolic parameters are  indeterminate. Linear filling defect at the apex, could be thrombus or  false tendon.  3. Right ventricular systolic function was not well visualized. The right  ventricular size is not well visualized.  4. The mitral valve was not well visualized. No evidence of mitral valve  regurgitation.  5. The aortic valve is tricuspid. Aortic valve regurgitation is trivial.  Mild aortic valve sclerosis is present, with no evidence of aortic valve  stenosis.  6. Aortic dilatation noted. There is borderline dilatation of the aortic  root, measuring 39 mm. There is mild dilatation of the ascending aorta,  measuring 40 mm.   Comparison(s): No significant change from prior study. 05/04/2020: LVEF  25-30%, 14 x 6 mm apical filing defect suspicious for LV thrombus. No  change in apical filling defect. Suspect false tendon, however, cannot  exclude thrombus given cardiomyopathy.    06/04/2020: R/LHC  Mid LAD lesion is 100% stenosed.  Prox Cx lesion is 100% stenosed.  Prox RCA lesion is 100% stenosed.  Dist RCA lesion is 20% stenosed.  Non-stenotic Origin to Prox Graft lesion was previously treated.  Prox Graft lesion is 30% stenosed.  LV end diastolic pressure is mildly elevated.   1. Severe 3 vessel occlusive CAD. 2. Patent LIMA to the LAD 3. Patent SVG to OM1 4. Patent SVG to RCA. Much improved flow from prior PCI. 5. Mildly elevated LVEDP 17 mm Hg 6. Successful placement of temporary transvenous pacemaker.  Plan: Dr Haroldine Laws placed a [name] catheter via the right internal jugular Vein. Will leave temporary transvenous pacemaker in place so that antiarrhythmic drugs can be used liberally.  Right heart cath demonstrates good cardiac  output and right heart pressures so we deferred placing an IABP. Will need right femoral arterial sheath removed manually.  RHC Findings:  Ao = 174/111 (138)  LV = 169/16 RA =  7 RV = 41/6 PA = 37/20 (28) PCW = 14 Fick cardiac output/index = 11.6/5.7 PVR = 1.2 WU FA sat = 99% PA sat = 85%, 86% SVC sat = 89%  High cardiac output with normal filling pressures. No evidence of intracardiac shunting.     05/04/2020: TTE IMPRESSIONS  1. Since the last study on 06/23/2019 LVEF has decreased from 35-40% to  25-30%,  there is a suspicion for left ventricular apical thrombus  measuring 14 x 6 mm.  2. Left ventricular ejection fraction, by estimation, is 25 to 30%. The  left ventricle has severely decreased function. The left ventricle  demonstrates global hypokinesis. The left ventricular internal cavity size  was moderately dilated. Left  ventricular diastolic parameters are consistent with Grade I diastolic  dysfunction (impaired relaxation). Elevated left atrial pressure.  3. Right ventricular systolic function is moderately reduced. The right  ventricular size is moderately enlarged. There is mildly elevated  pulmonary artery systolic pressure. The estimated right ventricular  systolic pressure is 56.8 mmHg.  4. Left atrial size was moderately dilated.  5. Right atrial size was moderately dilated.  6. The mitral valve is normal in structure. Mild mitral valve  regurgitation. No evidence of mitral stenosis.  7. The aortic valve is normal in structure. Aortic valve regurgitation is  mild. No aortic stenosis is present.  8. The inferior vena cava is normal in size with greater than 50%  respiratory variability, suggesting right atrial pressure of 3 mmHg.    Patient Profile     64 y.o. male with aPMH of CAD (s/p CABG in 2014 with LIMA-LAD, SVG-OM2 and SVG-RCA, s/p angioplasty to proximal SVG-RCA and DES to SVG-RCA anastomosisin 03/2019), chronic combined systolic and  diastolic CHF/ICM (EF 12-75% by echo in 06/2019), HTN, HLD,prior CVAand recent STEMIwith angioplasty of the distal bypass graft10/21/21  EMS called for near syncope at [place] found by EMS in [place]/VT treated with amio bolus' repeat bolus in ER >> asystole >> external pacing>> emergent cath/temp wire placement, intubated sedated.  Assessment & Plan   Dr. Quentin Ore has seen the patient this AM   . VT arrest -> asystole - emergent cath 06/04/20 with stable coronary anatomy - suspect scar mediated VT -  amio has been transitioned to PO (per tube)    Had some lido in the ER only - will planICD prior to d/c, once closer to d/c, hopefully by Friday - Keep K > 4.0 Mg > 2.0 - no bradycardia, temp wire is out (11/27)  We are looking to Friday for ICD implant  Will hold heparin gtt tomorrow and start [place] a day or 2 post procedure     2. Chronic systolic HF with biventricular failure -> cardiogenic shock  - Echo 05/04/20 EF 25%. Moderate RV to severe RV dysfunction and possible LV thrombus. LifeVest placed. D/c'd home on 05/06/20 - RHC on admit with preserved output and well compensated filling pressures.  Off NE and milrinone On dig, entresto, aldactone  Given asystolic event in ED, hold off betablocker Given small strokes, will allow some HTN   3. CAD  - s/p CABG  - Inferior STEMI 05/03/20 s/p POBA SVG->PDA - Emergent cath 06/04/20 with stable coronary anatomy - on DAPT/statin/zetia  4. Possible LV thrombus on echo 05/04/20 TEE  This admission with no thrombus Off heparin gtt last week   5. Acute hypoxic respiratory failure - intubated in setting of cardiac arrest Extubated 11/23 Intubated 11/23 Extubated 11/26  6. fever      99-100 >. None in last 48hrs      BC x2 are neg for 5 days      Pneumonia (HCAP)      WBC >> staying about 14      C/w CCM   7. AKI/CKD     Renal function remains stable  8. Delirium/stroke     Patient remains much better, asks appropriate  questions     Oriented to self, president, tells [place] he is [place], and year is 2022     Appreciate neurology help     Slightly confused this AM otherwise no marked focal deficits   9. Pulm emb     resp status appears stable     Started on heparin gtt (stroke protocol)     LE venous dopplers ordered yesterday still pending    10. Dysphagia       Cleared for ice chips and stock puree, planned for MBS today       Cortrack/nutrition   Missed wife while she was here yesterday, unable to get her on the phone yesterday D/w RN to call when she is here today     For questions or updates, please contact [facility] Please consult [email or web address] for contact info under        Signed, Baldwin [place], PA-C  06/14/2020, 7:52 AM

## 2020-06-15 DIAGNOSIS — I2690 Septic pulmonary embolism without acute cor pulmonale: Secondary | ICD-10-CM

## 2020-06-15 DIAGNOSIS — I472 Ventricular tachycardia: Secondary | ICD-10-CM | POA: Diagnosis not present

## 2020-06-15 DIAGNOSIS — J9601 Acute respiratory failure with hypoxia: Secondary | ICD-10-CM | POA: Diagnosis not present

## 2020-06-15 DIAGNOSIS — I2699 Other pulmonary embolism without acute cor pulmonale: Secondary | ICD-10-CM | POA: Diagnosis not present

## 2020-06-15 DIAGNOSIS — R570 Cardiogenic shock: Secondary | ICD-10-CM | POA: Diagnosis not present

## 2020-06-15 LAB — COOXEMETRY PANEL
Carboxyhemoglobin: 1 % (ref 0.5–1.5)
Methemoglobin: 0.5 % (ref 0.0–1.5)
O2 Saturation: 90.3 %
Total hemoglobin: 15 g/dL (ref 12.0–16.0)

## 2020-06-15 LAB — HEPARIN LEVEL (UNFRACTIONATED): Heparin Unfractionated: 0.27 IU/mL — ABNORMAL LOW (ref 0.30–0.70)

## 2020-06-15 LAB — BASIC METABOLIC PANEL
Anion gap: 13 (ref 5–15)
BUN: 20 mg/dL (ref 8–23)
CO2: 23 mmol/L (ref 22–32)
Calcium: 8.5 mg/dL — ABNORMAL LOW (ref 8.9–10.3)
Chloride: 105 mmol/L (ref 98–111)
Creatinine, Ser: 1.05 mg/dL (ref 0.61–1.24)
GFR, Estimated: >60 mL/min (ref >60)
Glucose, Bld: 134 mg/dL — ABNORMAL HIGH (ref 70–99)
Potassium: 3.6 mmol/L (ref 3.5–5.1)
Sodium: 141 mmol/L (ref 135–145)

## 2020-06-15 LAB — URINALYSIS, ROUTINE W REFLEX MICROSCOPIC
Bilirubin Urine: NEGATIVE
Glucose, UA: 50 mg/dL — AB
Ketones, ur: NEGATIVE mg/dL
Leukocytes,Ua: NEGATIVE
Nitrite: POSITIVE — AB
Protein, ur: 100 mg/dL — AB
RBC / HPF: >50 RBC/hpf — ABNORMAL HIGH (ref 0–5)
Specific Gravity, Urine: 1.017 (ref 1.005–1.030)
pH: 8 (ref 5.0–8.0)

## 2020-06-15 LAB — GLUCOSE, CAPILLARY
Glucose-Capillary: 101 mg/dL — ABNORMAL HIGH (ref 70–99)
Glucose-Capillary: 108 mg/dL — ABNORMAL HIGH (ref 70–99)
Glucose-Capillary: 115 mg/dL — ABNORMAL HIGH (ref 70–99)
Glucose-Capillary: 123 mg/dL — ABNORMAL HIGH (ref 70–99)
Glucose-Capillary: 142 mg/dL — ABNORMAL HIGH (ref 70–99)
Glucose-Capillary: 95 mg/dL (ref 70–99)

## 2020-06-15 LAB — MAGNESIUM: Magnesium: 2.1 mg/dL (ref 1.7–2.4)

## 2020-06-15 LAB — DIGOXIN LEVEL: Digoxin Level: 0.4 ng/mL — ABNORMAL LOW (ref 1.0–2.0)

## 2020-06-15 MED ORDER — OSMOLITE 1.5 CAL PO LIQD
1000.0000 mL | ORAL | Status: DC
  Administered 2020-06-15 – 2020-06-19 (×4): 1000 mL
  Filled 2020-06-15 (×10): qty 1000

## 2020-06-15 MED ORDER — PROSOURCE TF PO LIQD
45.0000 mL | Freq: Four times a day (QID) | ORAL | Status: DC
  Administered 2020-06-15 – 2020-06-22 (×25): 45 mL
  Filled 2020-06-15 (×30): qty 45

## 2020-06-15 MED ORDER — SODIUM CHLORIDE 0.9% FLUSH
3.0000 mL | Freq: Two times a day (BID) | INTRAVENOUS | Status: DC
  Administered 2020-06-15 – 2020-06-25 (×17): 3 mL via INTRAVENOUS

## 2020-06-15 MED ORDER — SODIUM CHLORIDE 0.9 % IV SOLN: 80.0000 mg | INTRAVENOUS | Status: AC

## 2020-06-15 MED ORDER — CEFAZOLIN SODIUM-DEXTROSE 2-4 GM/100ML-% IV SOLN: 2.0000 g | INTRAVENOUS | Status: AC

## 2020-06-15 MED ORDER — SODIUM CHLORIDE 0.9% FLUSH: 3.0000 mL | INTRAVENOUS | Status: DC | PRN

## 2020-06-15 MED ORDER — POTASSIUM CHLORIDE 20 MEQ PO PACK
40.0000 meq | PACK | Freq: Once | ORAL | Status: AC
  Administered 2020-06-15: 40 meq via ORAL
  Filled 2020-06-15: qty 2

## 2020-06-15 MED ORDER — CHLORHEXIDINE GLUCONATE 4 % EX LIQD
60.0000 mL | Freq: Once | CUTANEOUS | Status: AC
  Administered 2020-06-15: 4 via TOPICAL
  Filled 2020-06-15: qty 60

## 2020-06-15 MED ORDER — POTASSIUM CHLORIDE 10 MEQ/100ML IV SOLN
INTRAVENOUS | Status: AC
  Filled 2020-06-15: qty 100

## 2020-06-15 MED ORDER — SODIUM CHLORIDE 0.9 % IV SOLN: 250.0000 mL | INTRAVENOUS | Status: DC

## 2020-06-15 MED ORDER — SODIUM CHLORIDE 0.9 % IV SOLN: INTRAVENOUS | Status: DC

## 2020-06-15 MED ORDER — CHLORHEXIDINE GLUCONATE 4 % EX LIQD
60.0000 mL | Freq: Once | CUTANEOUS | Status: AC
  Administered 2020-06-16: 4 via TOPICAL
  Filled 2020-06-15: qty 60

## 2020-06-15 NOTE — Progress Notes (Signed)
Progress Note  Patient Name: [name] Date of Encounter: 06/15/2020  [facility] Cardiologist: Sanda Klein, MD   Subjective   Feeling a bit anxious or agitated, hoping to get OOB more, no CP, no SOB  Inpatient Medications    Scheduled Meds: .  stroke: mapping our early stages of recovery book   Does not apply Once  . amiodarone  200 mg Per Tube BID  . aspirin  81 mg Per Tube Daily  . atorvastatin  80 mg Per Tube q1800  . chlorhexidine  15 mL Mouth Rinse BID  . Chlorhexidine Gluconate Cloth  6 each Topical Daily  . clopidogrel  75 mg Per Tube Daily  . digoxin  0.125 mg Per Tube Daily  . escitalopram  10 mg Per Tube Daily  . ezetimibe  10 mg Per Tube Daily  . feeding supplement (PROSource TF)  45 mL Per Tube BID  . lactulose  20 g Per Tube TID  . mouth rinse  15 mL Mouth Rinse q12n4p  . sacubitril-valsartan  1 tablet Oral BID  . sodium chloride flush  10-40 mL Intracatheter Q12H  . sodium chloride flush  3 mL Intravenous Q12H  . spironolactone  25 mg Per Tube Daily   Continuous Infusions: . sodium chloride Stopped (06/10/20 1010)  . sodium chloride Stopped (06/10/20 1516)  . feeding supplement (VITAL 1.5 CAL) 40 mL/hr at 06/14/20 1600  . heparin 1,150 Units/hr (06/15/20 0529)   PRN Meds: sodium chloride, sodium chloride, acetaminophen (TYLENOL) oral liquid 160 mg/5 mL, albuterol, guaiFENesin, haloperidol lactate, loperamide HCl, nitroGLYCERIN, ondansetron (ZOFRAN) IV, promethazine, sodium chloride flush, sodium chloride flush   Vital Signs    Vitals:   06/15/20 0427 06/15/20 0507 06/15/20 0511 06/15/20 0804  BP: (!) 160/100 (!) 155/87 (!) 155/87 (!) 155/94  Pulse: 90 97 97 90  Resp: (!) 30 (!) 21 (!) 21 20  Temp: 98.6 F (37 C) 98.3 F (36.8 C) 98.3 F (36.8 C) 98.1 F (36.7 C)  TempSrc: Oral Oral  Oral  SpO2: 94% 95%  96%  Weight: 88.8 kg     Height:        Intake/Output Summary (Last 24 hours) at 06/15/2020 0934 Last data filed at 06/14/2020  2122 Gross per 24 hour  Intake 544.5 ml  Output 151 ml  Net 393.5 ml   Last 3 Weights 06/15/2020 06/14/2020 06/13/2020  Weight (lbs) 195 lb 12.3 oz 194 lb 7.1 oz 194 lb 7.1 oz  Weight (kg) 88.8 kg 88.2 kg 88.2 kg      Telemetry    SR, generally 70's, occ PVCs, had an hour or so of recurrent SNVT episodes last night, seems to have settled - Personally Reviewed  ECG    No new EKGs - Personally Reviewed  Physical Exam   GEN: somewhat chronically ill appearing Neck: No JVD Cardiac: RRR, no murmurs, rubs, or gallops Respiratory: decreased at the bases, no wheezing GI: Soft, nontender  MS: trace edema; No deformity. Neuro:  clear this morning   Psych: very pleasant  Labs    High Sensitivity Troponin:   Recent Labs  Lab 06/04/20 1148  TROPONINIHS 157*      Chemistry Recent Labs  Lab 06/13/20 0500 06/14/20 0548 06/15/20 0414  NA 141 139 141  K 3.5 3.3* 3.6  CL 104 104 105  CO2 24 23 23   GLUCOSE 120* 127* 134*  BUN 22 19 20   CREATININE 1.13 0.99 1.05  CALCIUM 8.5* 8.3* 8.5*  GFRNONAA >60 >  60 >60  ANIONGAP 13 12 13      Hematology Recent Labs  Lab 06/12/20 0409 06/13/20 0500 06/14/20 0548  WBC 14.3* 14.5* 14.2*  RBC 4.52 4.86 4.68  HGB 13.8 14.8 14.4  HCT 42.3 44.7 41.5  MCV 93.6 92.0 88.7  MCH 30.5 30.5 30.8  MCHC 32.6 33.1 34.7  RDW 12.5 12.6 12.7  PLT 264 313 363    BNPNo results for input(s): BNP, PROBNP in the last 168 hours.   DDimer No results for input(s): DDIMER in the last 168 hours.   Radiology    06/07/20: CXR  IMPRESSION: 1. Stable line positioning with stable left basilar atelectasis and/or infiltrate. 2. Very small, stable left pleural effusion.   Cardiac Studies   06/05/2020: TTE IMPRESSIONS  1. Technically difficult study with poor echo windows. Definity contrast  given.  2. Left ventricular ejection fraction, by estimation, is 25 to 30%. The  left ventricle has severely decreased function. The left ventricle   demonstrates global hypokinesis with regional variation including apical  dyskineisis. There is moderate left  ventricular hypertrophy. Left ventricular diastolic parameters are  indeterminate. Linear filling defect at the apex, could be thrombus or  false tendon.  3. Right ventricular systolic function was not well visualized. The right  ventricular size is not well visualized.  4. The mitral valve was not well visualized. No evidence of mitral valve  regurgitation.  5. The aortic valve is tricuspid. Aortic valve regurgitation is trivial.  Mild aortic valve sclerosis is present, with no evidence of aortic valve  stenosis.  6. Aortic dilatation noted. There is borderline dilatation of the aortic  root, measuring 39 mm. There is mild dilatation of the ascending aorta,  measuring 40 mm.   Comparison(s): No significant change from prior study. 05/04/2020: LVEF  25-30%, 14 x 6 mm apical filing defect suspicious for LV thrombus. No  change in apical filling defect. Suspect false tendon, however, cannot  exclude thrombus given cardiomyopathy.    06/04/2020: R/LHC  Mid LAD lesion is 100% stenosed.  Prox Cx lesion is 100% stenosed.  Prox RCA lesion is 100% stenosed.  Dist RCA lesion is 20% stenosed.  Non-stenotic Origin to Prox Graft lesion was previously treated.  Prox Graft lesion is 30% stenosed.  LV end diastolic pressure is mildly elevated.   1. Severe 3 vessel occlusive CAD. 2. Patent LIMA to the LAD 3. Patent SVG to OM1 4. Patent SVG to RCA. Much improved flow from prior PCI. 5. Mildly elevated LVEDP 17 mm Hg 6. Successful placement of temporary transvenous pacemaker.  Plan: Dr Haroldine Laws placed a [name] catheter via the right internal jugular Vein. Will leave temporary transvenous pacemaker in place so that antiarrhythmic drugs can be used liberally.  Right heart cath demonstrates good cardiac output and right heart pressures so we deferred placing an IABP. Will  need right femoral arterial sheath removed manually.  RHC Findings:  Ao = 174/111 (138)  LV = 169/16 RA =  7 RV = 41/6 PA = 37/20 (28) PCW = 14 Fick cardiac output/index = 11.6/5.7 PVR = 1.2 WU FA sat = 99% PA sat = 85%, 86% SVC sat = 89%  High cardiac output with normal filling pressures. No evidence of intracardiac shunting.     05/04/2020: TTE IMPRESSIONS  1. Since the last study on 06/23/2019 LVEF has decreased from 35-40% to  25-30%, there is a suspicion for left ventricular apical thrombus  measuring 14 x 6 mm.  2. Left ventricular  ejection fraction, by estimation, is 25 to 30%. The  left ventricle has severely decreased function. The left ventricle  demonstrates global hypokinesis. The left ventricular internal cavity size  was moderately dilated. Left  ventricular diastolic parameters are consistent with Grade I diastolic  dysfunction (impaired relaxation). Elevated left atrial pressure.  3. Right ventricular systolic function is moderately reduced. The right  ventricular size is moderately enlarged. There is mildly elevated  pulmonary artery systolic pressure. The estimated right ventricular  systolic pressure is 24.2 mmHg.  4. Left atrial size was moderately dilated.  5. Right atrial size was moderately dilated.  6. The mitral valve is normal in structure. Mild mitral valve  regurgitation. No evidence of mitral stenosis.  7. The aortic valve is normal in structure. Aortic valve regurgitation is  mild. No aortic stenosis is present.  8. The inferior vena cava is normal in size with greater than 50%  respiratory variability, suggesting right atrial pressure of 3 mmHg.    Patient Profile     64 y.o. male with aPMH of CAD (s/p CABG in 2014 with LIMA-LAD, SVG-OM2 and SVG-RCA, s/p angioplasty to proximal SVG-RCA and DES to SVG-RCA anastomosisin 03/2019), chronic combined systolic and diastolic CHF/ICM (EF 35-36% by echo in 06/2019), HTN, HLD,prior  CVAand recent STEMIwith angioplasty of the distal bypass graft10/21/21  [place]/VT treated with amio bolus' repeat bolus in ER >> asystole >> external pacing>> emergent cath/temp wire placement, intubated sedated.  Assessment & Plan   Dr. Quentin Ore has seen the patient this AM   . VT arrest -> asystole - emergent cath 06/04/20 with stable coronary anatomy - suspect scar mediated VT -  amio has been transitioned to PO     Had some lido in the ER only - Keep K > 4.0 Mg > 2.0 - no bradycardia, temp wire is out (11/27)  We are looking to tomorrow  for ICD implant  Will hold heparin gtt at midnight, discussed with pharmacy and start [facility] a day or 2 post procedure   I have spoken with the patient this morning about device implant, re-discussed the procedure, and potential risks and benefits, he remains agreeable Wife was aware of plans for procedure as well, for tomorrow  NSVT last night Replace K+, continue amio   2. Chronic systolic HF with biventricular failure -> cardiogenic shock  - Echo 05/04/20 EF 25%. Moderate RV to severe RV dysfunction and possible LV thrombus. LifeVest placed. D/c'd home on 05/06/20 - RHC on admit with preserved output and well compensated filling pressures.  Off NE and milrinone On dig, entresto, aldactone  Given asystolic event in ED, hold off betablocker   3. CAD  - s/p CABG  - Inferior STEMI 05/03/20 s/p POBA SVG->PDA (felt 2/2 missed brilinta doses) - Emergent cath 06/04/20 with stable coronary anatomy - on DAPT/statin/zetia Will not hold DAPT for implant given his hx nd recent angioplasty    4. Possible LV thrombus on echo 05/04/20 TEE  This admission with no thrombus    Appreciate IM taking on attending role going forward  5. Acute hypoxic respiratory failure - intubated in setting of cardiac arrest Extubated 11/23 Intubated 11/23 Extubated 11/26  6. fever      Resolved      BC  x2 are neg for 5 days      Pneumonia (HCAP)      WBC >> last 14      C/w IM  7. AKI/CKD     Renal function remains stable  8. Delirium/stroke     Patient remains much better, asks appropriate questions     Oriented to self, president, tells [place] he is [place], and year is 2022     Appreciate neurology help > s/o yesterday        9. Pulm emb     resp status remains stable     Re-started on heparin gtt (stroke protocol)     LE venous dopplers+ b/l DVTs    10. Dysphagia       Cleared for diet yesterday by speech           mobilize with OOB to chair, d/w RN, continue PT   Discussed with wife findings, plan yesterday as did CCM MD    For questions or updates, please contact [place] Please consult [email or web address] for contact info under        Signed, Baldwin [place], PA-C  06/15/2020, 9:34 AM

## 2020-06-15 NOTE — Progress Notes (Addendum)
Nutrition Follow-up  DOCUMENTATION CODES:   Obesity unspecified  INTERVENTION:   Transition to nocturnal tube feeding:  -Osmolite 1.5 @ 75 ml/hr x 14 hours (1800-0800) via Cortrak -45 ml ProSource QID  Provides: 1735 kcals, 110 grams protein, 800 ml free water. Meets 87% kcal and 100% of protein needs.   NUTRITION DIAGNOSIS:   Inadequate oral intake related to inability to eat as evidenced by NPO status.  Diet advanced to regular   GOAL:   Patient will meet greater than or equal to 90% of their needs  Addressed via TF  MONITOR:   Vent status, Labs, Weight trends, I & O's, Diet advancement  REASON FOR ASSESSMENT:   Ventilator    ASSESSMENT:   64 year old male admitted with recurrent VT. Past medical history of CAD s/p CABG (2014), chronic combined CHF, ICM, HTN, HLD, CVA, and recent admission for STEMI (10/21) secondary to thrombotic occlusion of SVG to RCA, discharged 10/23 on LifeVest  11/21 Intubated 11/23 Extubated, Re-intubated 11/24 TEE EF 30-35% 11/26 Extubated  Plan ICD tomorrow.   Diet advanced to regular yesterday. Meal intakes documented as 0% for last three meals. Per pt he feels full during the day and this affects his appetite. Denies nausea/vomiting. Discussed transition to nocturnal feedings to allow break for PO intake during the day. Pt amendable.   RD encouraged PO intake. Recommend continuing current tube feeding recommendation until pt demonstrates progress and consistency in PO intake.   Admission weight: 93.4 kg  Current weight: 88.8 kg   UOP: 150 ml x 24 hrs   Medications: lactulose, aldactone  Labs: CBG 95-127  Diet Order:   Diet Order            Diet regular Room service appropriate? Yes; Fluid consistency: Thin  Diet effective now                 EDUCATION NEEDS:   No education needs have been identified at this time  Skin:  Skin Assessment: Reviewed RN Assessment  Last BM:  12/1  Height:   Ht Readings from Last 1  Encounters:  06/04/20 5\' 7"  (1.702 m)    Weight:   Wt Readings from Last 1 Encounters:  06/15/20 88.8 kg    BMI:  Body mass index is 30.66 kg/m.  Estimated Nutritional Needs:   Kcal:  2000-2200 kcals  Protein:  110-125 g  Fluid:  >/= 2 L   Mariana Single RD, LDN Clinical Nutrition Pager listed in [place]

## 2020-06-15 NOTE — Care Management Important Message (Signed)
Important Message  Patient Details  Name: [name] MRN: [id number] Date of Birth: 02/01/1956   Medicare Important Message Given:  Yes     Shelda Altes 06/15/2020, 11:30 AM

## 2020-06-15 NOTE — Progress Notes (Addendum)
Patient ID: [name], male   DOB: 12-21-1955, 64 y.o.   MRN: [id number]     Advanced Heart Failure Rounding Note  PCP-Cardiologist: Sanda Klein, MD   Subjective:    Noted to be more confused this am and w/ acute RUE weakness. CODE Stroke called. Found with incidental finding of lobar and segmental pulmonary emboli noted in bilateral upper lobes on neck CT. CT angio showed b/l multifocal PE with subacute to chronic thrombus in LLL pulmonary artery. MRI brain showed small acute infarcts with mild edema but no mass effect.   Today: [name] is slightly confused today, thought it was January 2022 and answered he was in [address].  He however knew he was going for a procedure tomorrow.  He denies chest pain, shortness of breath, headache, dizziness, weakness, numbness or tingling.   Objective:   Weight Range: 88.8 kg Body mass index is 30.66 kg/m.   Vital Signs:   Temp:  [97.8 F (36.6 C)-98.6 F (37 C)] 98.1 F (36.7 C) (12/02 0804) Pulse Rate:  [37-97] 90 (12/02 0804) Resp:  [20-30] 20 (12/02 0804) BP: (132-179)/(87-121) 155/94 (12/02 0804) SpO2:  [92 %-96 %] 96 % (12/02 0804) Weight:  [88.8 kg] 88.8 kg (12/02 0427) Last BM Date: 06/14/20  Weight change: Filed Weights   06/13/20 0500 06/14/20 0500 06/15/20 0427  Weight: 88.2 kg 88.2 kg 88.8 kg    Intake/Output:   Intake/Output Summary (Last 24 hours) at 06/15/2020 0959 Last data filed at 06/14/2020 2122 Gross per 24 hour  Intake 544.5 ml  Output 151 ml  Net 393.5 ml      Physical Exam    General:  Well appearing. No resp difficulty HEENT: Pupils right slightly larger than left but both reactive.  Neck: supple. no JVD. Dressing to right sided of neck (no erythema, swelling or drainage).  Cor: PMI nondisplaced. Regular rate & rhythm. Lungs: clear bilaterally upper lobes.  Abdomen: soft, nontender, nondistended. No hepatosplenomegaly. No bruits or masses. Good bowel sounds. Extremities: no cyanosis, clubbing,  rash, edema.  Slight non-sustained clonus to LLE.  Neuro: alert & oriented, cranial nerves grossly intact. moves all 4 extremities w/o difficulty. Affect pleasant   Telemetry   Multiple runs of NSVT overnight and today intermittent PVCs.  Personally reviewed.    Labs    CBC Recent Labs    06/13/20 0500 06/14/20 0548  WBC 14.5* 14.2*  HGB 14.8 14.4  HCT 44.7 41.5  MCV 92.0 88.7  PLT 313 814   Basic Metabolic Panel Recent Labs    06/13/20 0500 06/13/20 1947 06/14/20 0548 06/15/20 0414  NA   < >  --  139 141  K   < >  --  3.3* 3.6  CL   < >  --  104 105  CO2   < >  --  23 23  GLUCOSE   < >  --  127* 134*  BUN   < >  --  19 20  CREATININE   < >  --  0.99 1.05  CALCIUM   < >  --  8.3* 8.5*  MG   < > 1.8 1.9 2.1  PHOS  --  2.0* 2.1*  --    < > = values in this interval not displayed.   Liver Function Tests No results for input(s): AST, ALT, ALKPHOS, BILITOT, PROT, ALBUMIN in the last 72 hours. No results for input(s): LIPASE, AMYLASE in the last 72 hours. Cardiac Enzymes No results for input(s):  CKTOTAL, CKMB, CKMBINDEX, TROPONINI in the last 72 hours.  BNP: BNP (last 3 results) Recent Labs    05/03/20 2330 05/04/20 0536  BNP 1,169.8* 1,408.4*    ProBNP (last 3 results) No results for input(s): PROBNP in the last 8760 hours.   D-Dimer No results for input(s): DDIMER in the last 72 hours. Hemoglobin A1C Recent Labs    06/14/20 0548  HGBA1C 5.8*   Fasting Lipid Panel Recent Labs    06/14/20 0548  CHOL 131  HDL 25*  LDLCALC 73  TRIG 165*  CHOLHDL 5.2   Thyroid Function Tests No results for input(s): TSH, T4TOTAL, T3FREE, THYROIDAB in the last 72 hours.  Invalid input(s): FREET3  Other results:   Imaging    ECHOCARDIOGRAM LIMITED BUBBLE STUDY  Result Date: 06/14/2020    ECHOCARDIOGRAM LIMITED REPORT   Patient Name:   [name] Date of Exam: 06/14/2020 Medical Rec #:  [id number]      Height:       67.0 in Accession #:    [id number]      Weight:       194.4 lb Date of Birth:  [birth year]     BSA:          1.998 m Patient Age:    41 years       BP:           167/114 mmHg Patient Gender: M              HR:           87 bpm. Exam Location:  Inpatient Procedure: Limited Echo and Saline Contrast Bubble Study Indications:    Stroke; bubble study to r/o PFO  History:        Patient has prior history of Echocardiogram examinations, most                 recent 06/07/2020. CAD and Previous Myocardial Infarction, Prior                 CABG; Risk Factors:Hypertension.  Sonographer:    Mikki Santee RDCS (AE) Sonographer#2:  Clayton Lefort RDCS (AE) Referring Phys: [id number] RENEE LYNN Ettrick  1. Left ventricular ejection fraction, by gross estimation, is 25 to 30% (assessed in one view). The left ventricle has severely decreased function.  2. The right ventricular size is mildly enlarged.  3. Agitated saline contrast bubble study was negative, with no evidence of any interatrial shunt. Comparison(s): No significant change from prior study. Limited study- bubble- without evidence of interatrial shunt. FINDINGS  Left Ventricle: Left ventricular ejection fraction, by estimation, is 25 to 30%. The left ventricle has severely decreased function. Right Ventricle: The right ventricular size is mildly enlarged. IAS/Shunts: Agitated saline contrast was given intravenously to evaluate for intracardiac shunting. Agitated saline contrast bubble study was negative, with no evidence of any interatrial shunt. Rudean Haskell MD Electronically signed by Rudean Haskell MD Signature Date/Time: 06/14/2020/12:42:02 PM    Final    VAS [place] LOWER EXTREMITY VENOUS (DVT)  Result Date: 06/14/2020  Lower Venous DVT Study Indications: Pulmonary embolism.  Risk Factors: Confirmed PE. Anticoagulation: Heparin. Limitations: Bandages. Comparison Study: No prior studies. Performing Technologist: Oliver Hum RVT  Examination Guidelines: A complete evaluation includes  B-mode imaging, spectral Doppler, color Doppler, and power Doppler as needed of all accessible portions of each vessel. Bilateral testing is considered an integral part of a complete examination. Limited examinations for reoccurring indications may be performed as noted. The reflux portion  of the exam is performed with the patient in reverse Trendelenburg.  +---------+---------------+---------+-----------+----------+--------------+ RIGHT    CompressibilityPhasicitySpontaneityPropertiesThrombus Aging +---------+---------------+---------+-----------+----------+--------------+ CFV      Full           Yes      Yes                                 +---------+---------------+---------+-----------+----------+--------------+ SFJ      Full                                                        +---------+---------------+---------+-----------+----------+--------------+ FV Prox  Full                                                        +---------+---------------+---------+-----------+----------+--------------+ FV Mid   Full                                                        +---------+---------------+---------+-----------+----------+--------------+ FV DistalFull                                                        +---------+---------------+---------+-----------+----------+--------------+ PFV      Full                                                        +---------+---------------+---------+-----------+----------+--------------+ POP      Full           Yes      Yes                                 +---------+---------------+---------+-----------+----------+--------------+ PTV      Partial                                      Acute          +---------+---------------+---------+-----------+----------+--------------+ PERO     None                                         Acute           +---------+---------------+---------+-----------+----------+--------------+   +---------+---------------+---------+-----------+----------+--------------+ LEFT     CompressibilityPhasicitySpontaneityPropertiesThrombus Aging +---------+---------------+---------+-----------+----------+--------------+ CFV      Full           Yes      Yes                                 +---------+---------------+---------+-----------+----------+--------------+  SFJ      Full                                                        +---------+---------------+---------+-----------+----------+--------------+ FV Prox  Full                                                        +---------+---------------+---------+-----------+----------+--------------+ FV Mid   Full                                                        +---------+---------------+---------+-----------+----------+--------------+ FV DistalFull                                                        +---------+---------------+---------+-----------+----------+--------------+ PFV      Full                                                        +---------+---------------+---------+-----------+----------+--------------+ POP      Full           Yes      Yes                                 +---------+---------------+---------+-----------+----------+--------------+ PTV      Partial                                      Acute          +---------+---------------+---------+-----------+----------+--------------+ PERO     Full                                                        +---------+---------------+---------+-----------+----------+--------------+ Gastroc  None                                         Acute          +---------+---------------+---------+-----------+----------+--------------+     Summary: RIGHT: - Findings consistent with acute deep vein thrombosis involving the right posterior tibial veins, and  right peroneal veins. - No cystic structure found in the popliteal fossa.  LEFT: - Findings consistent with acute deep vein thrombosis involving the left gastrocnemius veins, and left posterior tibial veins. - No cystic structure found in the popliteal fossa.  *See table(s) above  for measurements and observations. Electronically signed by Harold Barban MD on 06/14/2020 at 8:52:42 PM.    Final      Medications:     Scheduled Medications: .  stroke: mapping our early stages of recovery book   Does not apply Once  . amiodarone  200 mg Per Tube BID  . aspirin  81 mg Per Tube Daily  . atorvastatin  80 mg Per Tube q1800  . chlorhexidine  15 mL Mouth Rinse BID  . Chlorhexidine Gluconate Cloth  6 each Topical Daily  . clopidogrel  75 mg Per Tube Daily  . digoxin  0.125 mg Per Tube Daily  . escitalopram  10 mg Per Tube Daily  . ezetimibe  10 mg Per Tube Daily  . feeding supplement (PROSource TF)  45 mL Per Tube BID  . lactulose  20 g Per Tube TID  . mouth rinse  15 mL Mouth Rinse q12n4p  . potassium chloride  40 mEq Oral Once  . sacubitril-valsartan  1 tablet Oral BID  . sodium chloride flush  10-40 mL Intracatheter Q12H  . sodium chloride flush  3 mL Intravenous Q12H  . spironolactone  25 mg Per Tube Daily    Infusions: . sodium chloride Stopped (06/10/20 1010)  . sodium chloride Stopped (06/10/20 1516)  . feeding supplement (VITAL 1.5 CAL) 40 mL/hr at 06/14/20 1600  . heparin 1,200 Units/hr (06/15/20 0940)    PRN Medications: sodium chloride, sodium chloride, acetaminophen (TYLENOL) oral liquid 160 mg/5 mL, albuterol, guaiFENesin, haloperidol lactate, loperamide HCl, nitroGLYCERIN, ondansetron (ZOFRAN) IV, promethazine, sodium chloride flush, sodium chloride flush    Patient Profile   [name] a 64 y.o.malewith aPMH of CAD (s/p CABG in 2014 with LIMA-LAD, SVG-OM2 and SVG-RCA, s/p angioplasty to proximal SVG-RCA and DES to SVG-RCA anastomosisin 03/2019), chronic combined  systolic and diastolic CHF/ICM (EF 77-41% by echo in 06/2019), HTN, HLD,prior CVAand recent STEMIwith angioplasty of the distal bypass graft10/21/21 d/c home with Life Vest.  Presented in VT requiring multiple amio bolus and lido but developed asystole.  Underwent external pacing and atropine then sent to Cath lab.      Assessment/Plan   1. VT arrest -> asystole - emergent cath 06/04/20 with stable coronary anatomy - suspect scar mediated VT - 12/1 had long runs of NSVT.  - Continue PO amiodarone 200 mg bid via NGT - TTVP discontinued 11/27, no further bradycardia.  - EP following. Will need dual chamber ICD prior to d/c likely on Friday.  - Keep K > 4.0 Mg > 2.0.   2. Acute on Chronic systolic HF with biventricular failure -> cardiogenic shock  - Echo 05/04/20 EF 25%. Moderate RV to severe RV dysfunction and possible LV thrombus. LifeVest placed. D/c'd home on 05/06/20 - RHC on admit with preserved output and well compensated filling pressures.  - Echo remains unchanged from October 2021, EF 25-30%, mod LVH, global hypokineses, aortic root dilatation 3.9 and ascending aorta 4.0 - TEE 11/24 EF 30-35% - Off NE and milrinone.  - Continue digoxin 0.125 mcg  - Continue with Entresto dose 97-103 mg BID - Continue spironolactone 25 daily.   - Volume status ok, no loop diuretics currently   3. Acute ischemic CVA/ ? Component of Encephalopathy - Stat Head CT 11/30 for AMS + RUE weakness showed no acute abnormality. Hypodensity right posterior cerebellum most consistent with chronic infarct. -MRI revealed scattered embolic infarcts in L hemisphere w/ some edema but no shift - LDL 73, HDL 25, Trig  165, Chol 131, A1c 5.8% - Neurology recommendations appreciated, discussed BP parameters (Goal SBP 130-140) will need to follow up in stroke clinic NP at [facility] in 4 weeks - Called and discuss non-sustained clonus, no change in strength no acute needs can be followed up outpatient.  - also suspect  multifactorial toxic metabolic encephalopathy - NH3- 46, given lactulose (BM overnight)  - U/A pending - frequent reorientation, neurochecks, PT/OT and SLP  - on ASA 81, plavix 75 mg, zetia 10, statin 80 and heparin gtt  4. Bilateral PE and bilateral DVTs - incidental finding on neck CT, lobar and segmental pulmonary emboli noted in bilateral upper lobes -u/s acute DVT R posterior tibial veins and right peroneal veins & left gastrocnemius veins and left posterior tibial veins.  - hemodynamically stable, O2 sats 95% on RA - per neurology, ok to start heparin. No bolus.   5. CAD  - s/p CABG  - Inferior STEMI 05/03/20 s/p POBA SVG->PDA - Emergent cath 06/04/20 with stable coronary anatomy - C/w DAPT/statin via NGT  - No chest pain.   6. Possible LV thrombus on echo 05/04/20 - Repeat echo apical filling defect suspicious for LV thrombus vs false tendon -TEE 11/24 no LV thrombus - Resumed heparin gtt for PE and embolic nature of CVA  7. Acute hypoxic respiratory failure - intubated in setting of cardiac arrest - Extubated 11/23 at 14:22, required bipap then re-intubated 11/23 at 18:38 - CCM consulted to help manage vent and sedation, recommendations and assistance appreciated.  - Extubated 11/26.  8.  LL PNA (HCAP) - COVID, Flu negative; lactic acid 1.0 -> 1.3 -> 0.9 - PCT 0.22 -> 0.21 ->  0.31 -> 0.53 - Cultures normal flora  - Completed tx w/ Vanc + cefepime  9.  AKI - resolved  - baseline Cr on previous admission around 1.3 - Foley exchanged 11/23 d/t clot causing obstruction - Admission Cr 1.78, creatinine 1.06 today.   10.  Thrombocytopenia - Resolved  11. Dysphagia - Failed initial swallow evaluation - Hoarse/quiet voice, ?vocal cord damage.  - Now w/ NGT for meds - SLP following  12. Deconditioning - OOB and in chair today  - mobilize w/ PT/OT    Length of Stay: [place], NP  06/15/2020, 9:59 AM  Advanced Heart Failure Team Pager [phone]  (M-F; [place])  Please contact [facility] for night-coverage after hours (4p -7a ) and weekends on [email or web address]  Patient seen and examined with the above-signed Advanced Practice Provider and/or Housestaff. I personally reviewed laboratory data, imaging studies and relevant notes. I independently examined the patient and formulated the important aspects of the plan. I have edited the note to reflect any of my changes or salient points. I have personally discussed the plan with the patient and/or family.  Mildy confused at times. Denies SOB, orthopnea or PND. Multiple runs NSVT overnight. Volumes status ok. Weight stable. On heparin for embolic CVA and PEs.   General:  Well appearing. No resp difficulty HEENT: normal Neck: supple. no JVD. Carotids 2+ bilat; no bruits. No lymphadenopathy or thryomegaly appreciated. Cor: PMI nondisplaced. Regular rate & rhythm. No rubs, gallops or murmurs. Lungs: clear Abdomen: soft, nontender, nondistended. No hepatosplenomegaly. No bruits or masses. Good bowel sounds. Extremities: no cyanosis, clubbing, rash, edema Neuro: alert & orientedx3, cranial nerves grossly intact. moves all 4 extremities w/o difficulty. Affect pleasant  Volumes status looks ok. BP a bit high. Tolerating HF meds. Had NSVT/VT overnight. Plan for  ICD tomorrow. If BP remains high consider addition of amlodipine. After ICD can switch heparin to Eliquis when ok with EP.   Glori Bickers, MD  3:17 PM

## 2020-06-15 NOTE — Progress Notes (Signed)
PROGRESS NOTE    [name]  LKG:[id number] DOB: February 19, 1956 DOA: 06/04/2020 PCP: [name], MD     Brief Narrative:  64 yo WM PMHx CAD w/ previous CABG in 2014, chronic combined systolic and diastolic HF/ ICM, HTN, HLD, CVA, and recent inferior STEMI 05/03/20 secondary to thrombotic occlusion of the SVG to the RCA, likely due to missed ticagrelor doses.  EF noted to be 25% with moderate RV to severe RV dysfunction and possible LV thrombus 05/04/2020.  He was discharged home on 10/23 on a LifeVest, but apparently not using.    On 11/21, he developed dizziness at church.  EMS found patient in wide complex tachycardia treated with amiodarone and lidocaine with successful conversion to NSR.  On arrival to ER, patient had recurrence of his VT but hemodynamically tolerating.  He was given additional amiodarone and lidocaine gtt started, however developed severe bradycardia requiring atropine and external pacing.  He was intubated and taken emergently for temporary transvenous pacemaker and coronary angiogram.  Returns to ICU sedated and intubated, PCCM consulted for further ventilator management.   Subjective: A/O x2 (does not know where, when).  With prompting does know that he is in the hospital.  Negative CP, negative S OB   Assessment & Plan: Covid vaccination; unvaccinated   Active Problems:   Respiratory failure with hypoxia (HCC)   Ventricular tachycardia (HCC)   Cardiogenic shock ([place])   Acute stroke due to ischemia [facility])   Acute pulmonary embolism without acute cor pulmonale (HCC)   Recurrent VT possibly scar or bradycardia mediated Underlying sinus node dysfunction P: - EP primary, plan to continue oral amio and digoxin  - Will eventually need ICD prior to discharge, hopefully by Friday  Cardiogenic shock Chronic systolic/ diastolic HF with biventricular failure. P: - Per HF team, continue digoxin, entresto, aldactone - strict I/Os, daily wts -12/3 scheduled  for ICD placement  CAD s/p CABG with recent inferior STEMI 04/2020 HTN P: - continue Asa, Lipitor, Plavix, Zetia  Acute hypoxic respiratory failure with small pleural effusion Dysphagia  Extubated 11/23 - Continue to wean oxygen as tolerated - Pulmonary hygiene- IS, PT, moblize, CPT, NT prn  - aspiration precautions; continue cortrak for EN/ meds; SLP following  Nausea secondary to exaggerated gag response secondary to stroke and recent intubation Improved, remains NPO for pacemaker insertion - SLP to evaluate - Nasogastric tube for medications.  May need a period of nasogastric feeding.  Leukocytosis/ fever presumed HCAP - Complete 7 days of Cefepime 11/29; cultures negative/ normal flora; continue to monitor clinically  AKI- resolved - trend BMET, renal dose meds and avoid nephrotoxins - strict I/Os  Encephalopathy , Acute CVA-Embolic L infarcts Seen by Neurology P: -serial neuro exams -Echocardiogram with bubble -heparin gtt -follow up by stroke tream - delirium precautions - pending ammonia/ UA  Bilateral lobar and segmental Pulmonary Embolism - incidental findings on stroke workup  - remains hemodynamically stable and on 2L [place] - bilateral venous dopplers to evaluate for DVT pending - depending on neuro recommendations, will need anticoagulation- likely heparin gtt for now and eventual DOAC  Deconditioning - PT/OT   DVT prophylaxis: Heparin drip Code Status: Full Family Communication:  Status is: Inpatient    Dispo: The patient is from: Home              Anticipated d/c is to:??              Anticipated d/c date is: 51 / 7  Patient currently unstable      Consultants:  HF tm PCCM EP Neurology   Procedures/Significant Events:  11/21 Admitted cardiology/ intubated/ L/RHC, TVP, PA catheter 11/22  Remains on NE, amio, heparin gtt, decreased UOP.  CVP 8, CI 1.34, co-ox 60-66%, started on milrinone 11/23 Tmax 101.7, w/  increased respiratory secretions prompted the initiation of antibiotics. co-ox 72%, CVP 12, CI 3.2, SVR 1090. Able to titrate off sedation and decrease NE; extubated 14:22 then re-intubated at 18:38.    11/21 R/LHC >   Severe 3 vessel occlusive CAD. 2. Patent LIMA to the LAD 3. Patent SVG to OM1 4. Patent SVG to RCA. Much improved flow from prior PCI. 5. Mildly elevated LVEDP 17 mm Hg 6. Successful placement of temporary transvenous pacemaker.  11/24 TEE >> neg for LV thrombus; EF 30-35%.Inferolateral HK  11/30 CTH >> 1. No acute abnormality. Generalized atrophy 2. Hypodensity right posterior cerebellum most consistent with chronic infarct, not present in 2020. 3. ASPECTS is 10  11/30 CTA head/ neck/ perfusion >> 1. No emergent large vessel occlusion or infarct/ischemia by perfusion. 2. Lobar and segmental pulmonary emboli incidentally noted in the upper lungs. 3. Minimal flow in the hypoplastic right vertebral artery. There is moderate to advanced atheromatous narrowing of the left V4 segment. 4. Carotid atherosclerosis without flow limiting stenosis or ulceration. 5. Suboptimal bolus.  11/30 CT head>>no acute findings 11/30 CTA head/neck>>no LVO 11/30 MRI brain>>Small acute infarcts involving the left frontal precentral gyrus and the juxtacortical left frontal white matter. Mild associated edema without mass effect 11/30 CT chest>>Bilateral multifocal pulmonary emboli the majority of which appear acute in nature. Some subacute to chronic thrombus is noted within the left lower lobe pulmonary artery.  I have personally reviewed and interpreted all radiology studies and my findings are as above.  VENTILATOR SETTINGS:    Cultures 11/21 SARS2/ flu >>neg 11/21 MRSA PCR >>neg 11/22 BCx 2 >> neg 11/23 sputum >> normal flora  Antimicrobials: Anti-infectives (From admission, onward)   Start     Dose/Rate Route Frequency Ordered Stop   06/09/20 1045  ceFEPIme (MAXIPIME) 2 g in  sodium chloride 0.9 % 100 mL IVPB        2 g 200 mL/hr over 30 Minutes Intravenous Every 8 hours 06/09/20 0948 06/13/20 0329   06/06/20 0800  vancomycin (VANCOREADY) IVPB 750 mg/150 mL  Status:  Discontinued        750 mg 150 mL/hr over 60 Minutes Intravenous Every 12 hours 06/06/20 0713 06/07/20 1201   06/06/20 0800  ceFEPIme (MAXIPIME) 2 g in sodium chloride 0.9 % 100 mL IVPB  Status:  Discontinued        2 g 200 mL/hr over 30 Minutes Intravenous Every 12 hours 06/06/20 0713 06/09/20 0948       Devices    LINES / TUBES:  11/21 ETT >> 11/23 11/21 R IJ cordis with PA catheter >> 11/21 R femoral TVP >> 11/21 R radial aline >>     Continuous Infusions: . sodium chloride Stopped (06/10/20 1010)  . sodium chloride Stopped (06/10/20 1516)  . feeding supplement (OSMOLITE 1.5 CAL)    . heparin 1,200 Units/hr (06/15/20 0940)  . potassium chloride       Objective: Vitals:   06/15/20 0804 06/15/20 1031 06/15/20 1100 06/15/20 1638  BP: (!) 155/94 (!) 164/118 (!) 146/100 (!) 165/117  Pulse: 90  97 83  Resp: 20  (!) 24 20  Temp: 98.1 F (36.7 C) 97.7 F (36.5 C)  98.1 F (36.7 C)  TempSrc: Oral Oral  Oral  SpO2: 96%  96% 93%  Weight:      Height:        Intake/Output Summary (Last 24 hours) at 06/15/2020 2023 Last data filed at 06/15/2020 1000 Gross per 24 hour  Intake --  Output 300 ml  Net -300 ml   Filed Weights   06/13/20 0500 06/14/20 0500 06/15/20 0427  Weight: 88.2 kg 88.2 kg 88.8 kg    Examination:  General: A/O x2 (does not know where, when).  With prompting does know that he is in the hospital, no acute respiratory distress Eyes: negative scleral hemorrhage, negative anisocoria, negative icterus ENT: Negative Runny nose, negative gingival bleeding, Neck:  Negative scars, masses, torticollis, lymphadenopathy, JVD Lungs: Clear to auscultation bilaterally without wheezes or crackles Cardiovascular: Regular rate and rhythm without murmur gallop or rub  normal S1 and S2 Abdomen: negative abdominal pain, nondistended, positive soft, bowel sounds, no rebound, no ascites, no appreciable mass Extremities: No significant cyanosis, clubbing, or edema bilateral lower extremities Skin: Negative rashes, lesions, ulcers Psychiatric:  Negative depression, negative anxiety, negative fatigue, negative mania  Central nervous system:  Cranial nerves II through XII intact, tongue/uvula midline, all extremities muscle strength 5/5, sensation intact throughout, negative dysarthria, negative expressive aphasia, negative receptive aphasia.  .     Data Reviewed: Care during the described time interval was provided by me .  I have reviewed this patient's available data, including medical history, events of note, physical examination, and all test results as part of my evaluation.  CBC: Recent Labs  Lab 06/10/20 0518 06/11/20 0331 06/12/20 0409 06/13/20 0500 06/14/20 0548  WBC 11.0* 12.3* 14.3* 14.5* 14.2*  HGB 12.9* 13.7 13.8 14.8 14.4  HCT 40.0 43.3 42.3 44.7 41.5  MCV 94.6 94.5 93.6 92.0 88.7  PLT 179 222 264 313 357   Basic Metabolic Panel: Recent Labs  Lab 06/11/20 0331 06/11/20 0331 06/12/20 0409 06/12/20 0409 06/12/20 1709 06/13/20 0500 06/13/20 1947 06/14/20 0548 06/15/20 0414  NA 139  --  139  --   --  141  --  139 141  K 4.1  --  3.5  --   --  3.5  --  3.3* 3.6  CL 106  --  107  --   --  104  --  104 105  CO2 23  --  24  --   --  24  --  23 23  GLUCOSE 106*  --  94  --   --  120*  --  127* 134*  BUN 17  --  19  --   --  22  --  19 20  CREATININE 1.20  --  1.21  --   --  1.13  --  0.99 1.05  CALCIUM 8.3*  --  8.0*  --   --  8.5*  --  8.3* 8.5*  MG 1.8   < > 2.1   < > 1.9 2.0 1.8 1.9 2.1  PHOS  --   --   --   --  1.5* 1.9* 2.0* 2.1*  --    < > = values in this interval not displayed.   GFR: Estimated Creatinine Clearance: 73.6 mL/min (by C-G formula based on SCr of 1.05 mg/dL). Liver Function Tests: No results for input(s):  AST, ALT, ALKPHOS, BILITOT, PROT, ALBUMIN in the last 168 hours. No results for input(s): LIPASE, AMYLASE in the last 168 hours. Recent Labs  Lab 06/13/20  1124  AMMONIA 46*   Coagulation Profile: No results for input(s): INR, PROTIME in the last 168 hours. Cardiac Enzymes: No results for input(s): CKTOTAL, CKMB, CKMBINDEX, TROPONINI in the last 168 hours. BNP (last 3 results) No results for input(s): PROBNP in the last 8760 hours. HbA1C: Recent Labs    06/14/20 0548  HGBA1C 5.8*   CBG: Recent Labs  Lab 06/14/20 2317 06/15/20 0426 06/15/20 0803 06/15/20 1144 06/15/20 1718  GLUCAP 121* 123* 108* 101* 95   Lipid Profile: Recent Labs    06/14/20 0548  CHOL 131  HDL 25*  LDLCALC 73  TRIG 165*  CHOLHDL 5.2   Thyroid Function Tests: No results for input(s): TSH, T4TOTAL, FREET4, T3FREE, THYROIDAB in the last 72 hours. Anemia Panel: No results for input(s): VITAMINB12, FOLATE, FERRITIN, TIBC, IRON, RETICCTPCT in the last 72 hours. Sepsis Labs: No results for input(s): PROCALCITON, LATICACIDVEN in the last 168 hours.  Recent Results (from the past 240 hour(s))  Expectorated sputum assessment w rflx to resp cult     Status: None   Collection Time: 06/06/20  7:58 AM   Specimen: Sputum  Result Value Ref Range Status   Specimen Description SPU  Final   Special Requests Normal  Final   Sputum evaluation   Final    THIS SPECIMEN IS ACCEPTABLE FOR SPUTUM CULTURE Performed at [facility], [address], [place], [place] [address]    Report Status 06/06/2020 FINAL  Final  Culture, respiratory     Status: None   Collection Time: 06/06/20  7:58 AM   Specimen: Sputum  Result Value Ref Range Status   Specimen Description SPU  Final   Special Requests Normal Reflexed from [id number]  Final   Gram Stain   Final    WBC PRESENT, PREDOMINANTLY PMN RARE GRAM POSITIVE COCCI    Culture   Final    FEW Normal respiratory flora-no Staph aureus or Pseudomonas seen Performed at  [facility], [address], [place], [place] [address]    Report Status 06/08/2020 FINAL  Final         Radiology Studies: CT ANGIO CHEST PE W OR WO CONTRAST  Result Date: 06/13/2020 CLINICAL DATA:  Pulmonary emboli on recent CTA of neck EXAM: CT ANGIOGRAPHY CHEST WITH CONTRAST TECHNIQUE: Multidetector CT imaging of the chest was performed using the standard protocol during bolus administration of intravenous contrast. Multiplanar CT image reconstructions and MIPs were obtained to evaluate the vascular anatomy. CONTRAST:  57mL OMNIPAQUE IOHEXOL 350 MG/ML SOLN COMPARISON:  CT of the neck from earlier in the same day. FINDINGS: Cardiovascular: Thoracic aorta demonstrates atherosclerotic calcifications and changes of prior coronary bypass grafting. Coronary calcifications are noted. No cardiac enlargement is noted. The pulmonary artery shows a normal branching pattern with significant bilateral pulmonary emboli the majority of which appears acute in nature. The changes in the lower lobe pulmonary artery on the left appear more subacute to chronic as there is apparent along the arterial wall. Mediastinum/Nodes: Thoracic inlet is within normal limits. No sizable hilar or mediastinal adenopathy is noted. Feeding catheter is noted extending into the distal stomach. The esophagus is within normal limits. Lungs/Pleura: Small pleural effusions are noted. The lungs are well aerated with mild lower lobe atelectatic changes right greater than left. No sizable parenchymal nodule is seen. Upper Abdomen: Vicarious excretion of contrast is noted within the gallbladder. The remainder of the upper abdomen is within limits. Musculoskeletal: Degenerative changes of the thoracic spine are seen.  Review of the MIP images confirms the above findings. IMPRESSION: Bilateral multifocal pulmonary emboli the majority of which appear acute in nature. Some subacute to chronic thrombus is noted within the left lower lobe  pulmonary artery. No right heart strain is noted. Bibasilar atelectatic changes with small effusions. Aortic Atherosclerosis (ICD10-I70.0). Electronically Signed   By: Inez Catalina M.D.   On: 06/13/2020 21:26   DG Swallowing Func-Speech Pathology  Result Date: 06/14/2020 Objective Swallowing Evaluation: Type of Study: MBS-Modified Barium Swallow Study  Patient Details Name: Myson Levi MRN: [id number] Date of Birth: 1955/12/12 Today's Date: 06/14/2020 Time: SLP Start Time (ACUTE ONLY): 0900 -SLP Stop Time (ACUTE ONLY): 0918 SLP Time Calculation (min) (ACUTE ONLY): 18 min Past Medical History: Past Medical History: Diagnosis Date . Borderline diabetic  . Coronary artery disease   a. s/p CABG in 2014 with LIMA-LAD, SVG-OM2 and SVG-RC b. 03/2019: STEMI and s/p angioplasty to proximal SVG-RCA and DES to SVG-RCA anastomosis c. 04/2020: recurrent STEMI with angioplasty of SVG-RCA . Dyslipidemia 01/11/2013 . Hiatal hernia  . Hypertension  . Mini stroke ([place])   2 . S/P CABG x 3  . STEMI (ST elevation myocardial infarction) ([place])  . Stroke [facility])  Past Surgical History: Past Surgical History: Procedure Laterality Date . CARDIAC CATHETERIZATION   . CORONARY ARTERY BYPASS GRAFT N/A 09/01/2012  Procedure: CORONARY ARTERY BYPASS GRAFTING (CABG);  Surgeon: Gaye Pollack, MD;  Location: [place];  Service: Open Heart Surgery;  Laterality: N/A;  Coronary Artery Bypass Grafting Times Three Using Left Internal Mammary Artery and Left Saphenous leg Vein Harvested Endoscopically . CORONARY STENT INTERVENTION N/A 03/23/2019  Procedure: CORONARY STENT INTERVENTION;  Surgeon: Lorretta Harp, MD;  Location: [place] CV LAB;  Service: Cardiovascular;  Laterality: N/A; . CORONARY/GRAFT ACUTE MI REVASCULARIZATION N/A 03/23/2019  Procedure: Coronary/Graft Acute MI Revascularization;  Surgeon: Lorretta Harp, MD;  Location: [place] CV LAB;  Service: Cardiovascular;  Laterality: N/A; . CORONARY/GRAFT ACUTE MI REVASCULARIZATION N/A  05/03/2020  Procedure: Coronary/Graft Acute MI Revascularization;  Surgeon: Belva Crome, MD;  Location: [place] CV LAB;  Service: Cardiovascular;  Laterality: N/A; . IABP INSERTION N/A 06/04/2020  Procedure: IABP Insertion;  Surgeon: Martinique, Peter M, MD;  Location: [place] [facility] LAB;  Service: Cardiovascular;  Laterality: N/A; . INTRAOPERATIVE TRANSESOPHAGEAL ECHOCARDIOGRAM N/A 09/01/2012  Procedure: INTRAOPERATIVE TRANSESOPHAGEAL ECHOCARDIOGRAM;  Surgeon: Gaye Pollack, MD;  Location: [place] OR;  Service: Open Heart Surgery;  Laterality: N/A; . LEFT HEART CATH AND CORONARY ANGIOGRAPHY N/A 03/23/2019  Procedure: LEFT HEART CATH AND CORONARY ANGIOGRAPHY;  Surgeon: Lorretta Harp, MD;  Location: [place] CV LAB;  Service: Cardiovascular;  Laterality: N/A; . LEFT HEART CATH AND CORONARY ANGIOGRAPHY N/A 05/03/2020  Procedure: LEFT HEART CATH AND CORONARY ANGIOGRAPHY;  Surgeon: Belva Crome, MD;  Location: [place] [facility];  Service: Cardiovascular;  Laterality: N/A; . LEFT HEART CATHETERIZATION WITH CORONARY ANGIOGRAM N/A 08/31/2012  Procedure: LEFT HEART CATHETERIZATION WITH CORONARY ANGIOGRAM;  Surgeon: Peter M Martinique, MD;  [facility];  Service: Cardiovascular;  Laterality: N/A; . PERCUTANEOUS CORONARY STENT INTERVENTION (PCI-S) N/A 08/31/2012  Procedure: PERCUTANEOUS CORONARY STENT INTERVENTION (PCI-S);  Surgeon: Peter M Martinique, MD;  Location: [facility] CATH LAB;  Service: Cardiovascular;  Laterality: N/A; . RIGHT/LEFT HEART CATH AND CORONARY/GRAFT ANGIOGRAPHY N/A 06/04/2020  Procedure: RIGHT/LEFT HEART CATH AND CORONARY/GRAFT ANGIOGRAPHY;  Surgeon: Martinique, Peter M, MD;  Location: [place] [facility];  Service: Cardiovascular;  Laterality: N/A; . TEMPORARY PACEMAKER N/A 06/04/2020  Procedure: TEMPORARY PACEMAKER;  Surgeon: Martinique, Peter M, MD;  Location: [place] [facility] LAB;  Service: Cardiovascular;  Laterality: N/A; HPI: 14 yoM with extensive cardiac history with recent STEMI 04/2020, D/Cd home on 10/23  with LIfevest, per notes was not using. Admitted 11/21 with Admitted 11/21 VT arrest, Acute on Chronic systolic HF with biventricular failure ->cardiogenic shock . Intubated and emergently taken to cath lab for temporary transvenous pacemaker and coronary angiogram.  Extubated 11/23 then reintubated four hours later, extubated 11/26.   Subjective: alert Assessment / Plan / Recommendation CHL IP CLINICAL IMPRESSIONS 06/14/2020 Clinical Impression Pt was seen for MBS which revealed a mild pharyngeal dysphagia. His oral phase of swallowing was characterized by timely manipulation and transit of POs. During the pharyngeal swallow, the pt demonstrated timely swallow initiation and adequate hyolaryngeal excursion. Epiglottic deflection was slightly reduced, leading to two instances of trace penetration/aspiration. Given 3 oz of thin liquid, one instance of trace penetration was observed during the swallow (PAS 2). While taking a pill with thin liquid, the pill became lodged in the valleculae. With additional sips of thin liquid to transit the pill, a trace amount of thin liquid entered the airway and fell below the vocal folds before being ejected out (PAS 6). During an esophageal sweep, the pill was observed to hesitate mid-esophagus. At this time, a regular diet and thin liquids are recommended. Provide meds whole with puree. Pt has some R sided weakness that will require assistance with meals. SLP will f/u acutely for diet toleration.  SLP Visit Diagnosis Dysphagia, pharyngeal phase (R13.13) Attention and concentration deficit following -- Frontal lobe and executive function deficit following -- Impact on safety and function Mild aspiration risk   CHL IP TREATMENT RECOMMENDATION 06/14/2020 Treatment Recommendations Therapy as outlined in treatment plan below   Prognosis 06/14/2020 Prognosis for Safe Diet Advancement Good Barriers to Reach Goals -- Barriers/Prognosis Comment -- CHL IP DIET RECOMMENDATION 06/14/2020 SLP  Diet Recommendations Regular solids;Thin liquid Liquid Administration via Cup;Straw Medication Administration Whole meds with puree Compensations Slow rate;Small sips/bites Postural Changes Seated upright at 90 degrees   CHL IP OTHER RECOMMENDATIONS 06/14/2020 Recommended Consults -- Oral Care Recommendations Oral care BID Other Recommendations --   CHL IP FOLLOW UP RECOMMENDATIONS 06/14/2020 Follow up Recommendations None   CHL IP FREQUENCY AND DURATION 06/14/2020 Speech Therapy Frequency (ACUTE ONLY) min 2x/week Treatment Duration 2 weeks      CHL IP ORAL PHASE 06/14/2020 Oral Phase WFL Oral - Pudding Teaspoon -- Oral - Pudding Cup -- Oral - Honey Teaspoon -- Oral - Honey Cup -- Oral - Nectar Teaspoon -- Oral - Nectar Cup -- Oral - Nectar Straw -- Oral - Thin Teaspoon -- Oral - Thin Cup -- Oral - Thin Straw -- Oral - Puree -- Oral - Mech Soft -- Oral - Regular -- Oral - Multi-Consistency -- Oral - Pill -- Oral Phase - Comment --  CHL IP PHARYNGEAL PHASE 06/14/2020 Pharyngeal Phase Impaired Pharyngeal- Pudding Teaspoon -- Pharyngeal -- Pharyngeal- Pudding Cup -- Pharyngeal -- Pharyngeal- Honey Teaspoon -- Pharyngeal -- Pharyngeal- Honey Cup -- Pharyngeal -- Pharyngeal- Nectar Teaspoon -- Pharyngeal -- Pharyngeal- Nectar Cup -- Pharyngeal -- Pharyngeal- Nectar Straw -- Pharyngeal -- Pharyngeal- Thin Teaspoon -- Pharyngeal -- Pharyngeal- Thin Cup WFL Pharyngeal -- Pharyngeal- Thin Straw Penetration/Aspiration during swallow Pharyngeal Material enters airway, remains ABOVE vocal cords then ejected out;Material enters airway, passes BELOW cords then ejected out Pharyngeal- Puree WFL Pharyngeal -- Pharyngeal- Mechanical Soft -- Pharyngeal -- Pharyngeal- Regular WFL Pharyngeal -- Pharyngeal- Multi-consistency --  Pharyngeal -- Pharyngeal- Pill Pharyngeal residue - valleculae Pharyngeal -- Pharyngeal Comment --  No flowsheet data found. Herbie Baltimore, MA CCC-SLP Acute Rehabilitation Services Pager [phone] Office  [phone]  Lynann Beaver 06/14/2020, 10:55 AM              ECHOCARDIOGRAM LIMITED BUBBLE STUDY  Result Date: 06/14/2020    ECHOCARDIOGRAM LIMITED REPORT   Patient Name:   KAYDENCE Yearick Date of Exam: 06/14/2020 Medical Rec #:  [id number]      Height:       67.0 in Accession #:    [id number]     Weight:       194.4 lb Date of Birth:  April 18, 1956     BSA:          1.998 m Patient Age:    64 years       BP:           167/114 mmHg Patient Gender: M              HR:           87 bpm. Exam Location:  Inpatient Procedure: Limited Echo and Saline Contrast Bubble Study Indications:    Stroke; bubble study to r/o PFO  History:        Patient has prior history of Echocardiogram examinations, most                 recent 06/07/2020. CAD and Previous Myocardial Infarction, Prior                 CABG; Risk Factors:Hypertension.  Sonographer:    Mikki Santee RDCS (AE) Sonographer#2:  Clayton Lefort RDCS (AE) Referring Phys: [id number] RENEE LYNN [place]  1. Left ventricular ejection fraction, by gross estimation, is 25 to 30% (assessed in one view). The left ventricle has severely decreased function.  2. The right ventricular size is mildly enlarged.  3. Agitated saline contrast bubble study was negative, with no evidence of any interatrial shunt. Comparison(s): No significant change from prior study. Limited study- bubble- without evidence of interatrial shunt. FINDINGS  Left Ventricle: Left ventricular ejection fraction, by estimation, is 25 to 30%. The left ventricle has severely decreased function. Right Ventricle: The right ventricular size is mildly enlarged. IAS/Shunts: Agitated saline contrast was given intravenously to evaluate for intracardiac shunting. Agitated saline contrast bubble study was negative, with no evidence of any interatrial shunt. Rudean Haskell MD Electronically signed by Rudean Haskell MD Signature Date/Time: 06/14/2020/12:42:02 PM    Final    VAS [place] LOWER EXTREMITY  VENOUS (DVT)  Result Date: 06/14/2020  Lower Venous DVT Study Indications: Pulmonary embolism.  Risk Factors: Confirmed PE. Anticoagulation: Heparin. Limitations: Bandages. Comparison Study: No prior studies. Performing Technologist: Oliver Hum RVT  Examination Guidelines: A complete evaluation includes B-mode imaging, spectral Doppler, color Doppler, and power Doppler as needed of all accessible portions of each vessel. Bilateral testing is considered an integral part of a complete examination. Limited examinations for reoccurring indications may be performed as noted. The reflux portion of the exam is performed with the patient in reverse Trendelenburg.  +---------+---------------+---------+-----------+----------+--------------+ RIGHT    CompressibilityPhasicitySpontaneityPropertiesThrombus Aging +---------+---------------+---------+-----------+----------+--------------+ CFV      Full           Yes      Yes                                 +---------+---------------+---------+-----------+----------+--------------+ SFJ  Full                                                        +---------+---------------+---------+-----------+----------+--------------+ FV Prox  Full                                                        +---------+---------------+---------+-----------+----------+--------------+ FV Mid   Full                                                        +---------+---------------+---------+-----------+----------+--------------+ FV DistalFull                                                        +---------+---------------+---------+-----------+----------+--------------+ PFV      Full                                                        +---------+---------------+---------+-----------+----------+--------------+ POP      Full           Yes      Yes                                  +---------+---------------+---------+-----------+----------+--------------+ PTV      Partial                                      Acute          +---------+---------------+---------+-----------+----------+--------------+ PERO     None                                         Acute          +---------+---------------+---------+-----------+----------+--------------+   +---------+---------------+---------+-----------+----------+--------------+ LEFT     CompressibilityPhasicitySpontaneityPropertiesThrombus Aging +---------+---------------+---------+-----------+----------+--------------+ CFV      Full           Yes      Yes                                 +---------+---------------+---------+-----------+----------+--------------+ SFJ      Full                                                        +---------+---------------+---------+-----------+----------+--------------+  FV Prox  Full                                                        +---------+---------------+---------+-----------+----------+--------------+ FV Mid   Full                                                        +---------+---------------+---------+-----------+----------+--------------+ FV DistalFull                                                        +---------+---------------+---------+-----------+----------+--------------+ PFV      Full                                                        +---------+---------------+---------+-----------+----------+--------------+ POP      Full           Yes      Yes                                 +---------+---------------+---------+-----------+----------+--------------+ PTV      Partial                                      Acute          +---------+---------------+---------+-----------+----------+--------------+ PERO     Full                                                         +---------+---------------+---------+-----------+----------+--------------+ Gastroc  None                                         Acute          +---------+---------------+---------+-----------+----------+--------------+     Summary: RIGHT: - Findings consistent with acute deep vein thrombosis involving the right posterior tibial veins, and right peroneal veins. - No cystic structure found in the popliteal fossa.  LEFT: - Findings consistent with acute deep vein thrombosis involving the left gastrocnemius veins, and left posterior tibial veins. - No cystic structure found in the popliteal fossa.  *See table(s) above for measurements and observations. Electronically signed by Harold Barban MD on 06/14/2020 at 8:52:42 PM.    Final         Scheduled Meds: .  stroke: mapping our early stages of recovery book   Does not apply Once  . amiodarone  200 mg Per Tube BID  . aspirin  81 mg Per Tube Daily  .  atorvastatin  80 mg Per Tube q1800  . chlorhexidine  15 mL Mouth Rinse BID  . Chlorhexidine Gluconate Cloth  6 each Topical Daily  . clopidogrel  75 mg Per Tube Daily  . digoxin  0.125 mg Per Tube Daily  . escitalopram  10 mg Per Tube Daily  . ezetimibe  10 mg Per Tube Daily  . feeding supplement (PROSource TF)  45 mL Per Tube QID  . lactulose  20 g Per Tube TID  . mouth rinse  15 mL Mouth Rinse q12n4p  . sacubitril-valsartan  1 tablet Oral BID  . sodium chloride flush  10-40 mL Intracatheter Q12H  . sodium chloride flush  3 mL Intravenous Q12H  . sodium chloride flush  3 mL Intravenous Q12H  . spironolactone  25 mg Per Tube Daily   Continuous Infusions: . sodium chloride Stopped (06/10/20 1010)  . sodium chloride Stopped (06/10/20 1516)  . feeding supplement (OSMOLITE 1.5 CAL)    . heparin 1,200 Units/hr (06/15/20 0940)  . potassium chloride       LOS: 11 days    Time spent:40 min    Paxtyn Boyar, Geraldo Docker, MD [facility] Pager [phone]  If 7PM-7AM, please contact  night-coverage [email or web address] Password [facility] 06/15/2020, 8:23 PM

## 2020-06-15 NOTE — Progress Notes (Addendum)
ANTICOAGULATION CONSULT NOTE  Pharmacy Consult for heparin Indication: pulmonary embolus  No Known Allergies  Patient Measurements: Height: 5\' 7"  (170.2 cm) Weight: 88.8 kg (195 lb 12.3 oz) IBW/kg (Calculated) : 66.1 Heparin Dosing Weight: 88kg  Vital Signs: Temp: 98.1 F (36.7 C) (12/02 0804) Temp Source: Oral (12/02 0804) BP: 155/94 (12/02 0804) Pulse Rate: 90 (12/02 0804)  Labs: Recent Labs    06/13/20 0500 06/13/20 1947 06/14/20 0548 06/15/20 0414  HGB 14.8  --  14.4  --   HCT 44.7  --  41.5  --   PLT 313  --  363  --   HEPARINUNFRC  --  0.50 0.33 0.27*  CREATININE 1.13  --  0.99 1.05    Estimated Creatinine Clearance: 73.6 mL/min (by C-G formula based on SCr of 1.05 mg/dL).   Assessment: 70 yoM admitted with VT and concern for ACS. Cath clean and heparin resumed for presumed LV thrombus. Repeat TEE 11/24 showed no thrombus so heparin discontinued. Pt noted to have acute CVA 11/30 am, CT angio demonstrated incidental finding of PE. Neurology cleared stroke protocol heparin, CCM consulted to begin heparin infusion. Will defer bolus and target lower goal.  Heparin level subtherapeutic at 0.27, no CBC today. Pt to undergo ICD tomorrow 12/3, per EP will stop heparin at midnight tonight.  Goal of Therapy:  Heparin level 0.3-0.5 units/ml Monitor platelets by anticoagulation protocol: Yes   Plan:  -Increase heparin slightly to 1200/h - continue until tonight at 2359 -Check daily heparin level and CBC  Arrie Senate, PharmD, BCPS, [facility] Clinical Pharmacist [phone] Please check AMION for all [place] numbers 06/15/2020

## 2020-06-15 NOTE — Progress Notes (Signed)
Occupational Therapy Treatment Patient Details Name: [name] MRN: [id number] DOB: Jan 13, 1956 Today's Date: 06/15/2020    History of present illness 64 yo with a hx as stated above who presented to [facility] 06/04/20 after having a pre-syncopal symptoms including dizzinezss while at church earlier today found to be in VT (not wearing Life Vest as prescribed on recent hospital discharge) on EMS arrival. Has not been compliant with LifeVest stating the battery pack too bulky. PMH of CAD (s/p CABG in 2014 with LIMA-LAD, SVG-OM2 and SVG-RCA, s/p angioplasty to proximal SVG-RCA and DES to SVG-RCA anastomosis in 03/2019), chronic combined systolic and diastolic CHF/ICM (EF 44-03% by echo in 06/2019), HTN, HLD, prior CVA and recent STEMI with angioplasty of the distal bypass graft 05/04/20. Admitted 06/04/20 for treatment of wide complex tachycardia. Intubated 11/21-Extubated 11/23 and reintubated extubated 11/26 R radial aline 11/23 TEE 11/24: EF 30-35% no evidence of LV clot.  11/30 pt noted to have increased R sided weakness by OT, code stroke called MRI revealed Small acute infarcts involving the left frontal precentral gyrus   OT comments  Patient continues to make steady progress towards goals in skilled OT session. Patient's session encompassed ADLs, further assessment of cognition, bed mobility, and transfers. Pt remains minimally disoriented (calling hospital [facility]) but could answer other orientation questions. Pts greatest limitation remains safety awareness, as pt tries to short sit needs constant cuing to stay upright when transferring and requires cues and assist to motor plan to get to the EOB (tangled up in sheets and confused as how to fix). Pt completed sit<>stands x2 with marching in place and advancing a few steps forward and backward in front of the chair (pt again demonstrates poor safety awareness with backwards movement). Discharge updated to CIR due to deficits and multi-system  involvement; therapy will continue to follow.    Follow Up Recommendations  CIR;Supervision/Assistance - 24 hour    Equipment Recommendations  3 in 1 bedside commode    Recommendations for Other Services      Precautions / Restrictions Precautions Precautions: Other (comment) Precaution Comments: Cortrak Restrictions Weight Bearing Restrictions: No       Mobility Bed Mobility Overal bed mobility: Needs Assistance Bed Mobility: Supine to Sit     Supine to sit: HOB elevated;Min assist     General bed mobility comments: increased ability to sequence trunk, but struggled minimally in trying to figure how to get out from under covers and required assist after they got tangled underneath him  Transfers Overall transfer level: Needs assistance Equipment used: Rolling walker (2 wheeled) Transfers: Stand Pivot Transfers;Sit to/from Stand Sit to Stand: Min assist Stand pivot transfers: Min assist       General transfer comment: cues for upright posture and hand placement, heavy cues not to short sti when pivoting or when advancing a few steps forward and backward in sit<>stands    Balance Overall balance assessment: Needs assistance Sitting-balance support: Feet supported Sitting balance-Leahy Scale: Fair     Standing balance support: Bilateral upper extremity supported Standing balance-Leahy Scale: Poor Standing balance comment: requires bilateral UE and outside support for steadying                            ADL either performed or assessed with clinical judgement   ADL Overall ADL's : Needs assistance/impaired                     Lower Body  Dressing: Moderate assistance;Bed level Lower Body Dressing Details (indicate cue type and reason): Continued difficulty in coordination of movement Toilet Transfer: Minimal assistance;Stand-pivot;RW Toilet Transfer Details (indicate cue type and reason): simualated to recliner, significant need for cues to  pivot backwards as pt impulsively speeds up and begins to desend before he is at the chair         Functional mobility during ADLs: Minimal assistance;Rolling walker (stand pivot and sit<>stands) General ADL Comments: Pt with decreased cognition, coorindation, balance, strength, and activity tolerance     Vision       Perception     Praxis      Cognition Arousal/Alertness: Awake/alert Behavior During Therapy: Impulsive (pt tries to short sit needs constant cuing to stay upright) Overall Cognitive Status: Impaired/Different from baseline Area of Impairment: Orientation;Following commands;Safety/judgement;Awareness;Problem solving                 Orientation Level: Disoriented to;Situation;Place   Memory: Decreased short-term memory Following Commands: Follows one step commands with increased time;Follows multi-step commands inconsistently Safety/Judgement: Decreased awareness of safety;Decreased awareness of deficits Awareness: Emergent Problem Solving: Slow processing;Difficulty sequencing;Requires verbal cues;Requires tactile cues General Comments: pt requires multimodal cuing for safety, unaware of his increased weakness, well re-orient, but when asked where he was he stated "[facility] right?"        Exercises     Shoulder Instructions       General Comments      Pertinent Vitals/ Pain          Home Living                                          Prior Functioning/Environment              Frequency  Min 3X/week        Progress Toward Goals  OT Goals(current goals can now be found in the care plan section)  Progress towards OT goals: Progressing toward goals  Acute Rehab OT Goals Patient Stated Goal: go home  OT Goal Formulation: With patient Time For Goal Achievement: 06/27/20 Potential to Achieve Goals: Good  Plan Discharge plan needs to be updated    Co-evaluation                 AM-PAC OT "6 Clicks"  Daily Activity     Outcome Measure   Help from another person eating meals?: Total Help from another person taking care of personal grooming?: A Little Help from another person toileting, which includes using toliet, bedpan, or urinal?: A Little Help from another person bathing (including washing, rinsing, drying)?: A Lot Help from another person to put on and taking off regular upper body clothing?: A Little Help from another person to put on and taking off regular lower body clothing?: A Lot 6 Click Score: 14    End of Session Equipment Utilized During Treatment: Rolling walker;Gait belt  OT Visit Diagnosis: Unsteadiness on feet (R26.81);Other abnormalities of gait and mobility (R26.89);Muscle weakness (generalized) (M62.81)   Activity Tolerance Patient tolerated treatment well   Patient Left in chair;with call bell/phone within reach;with chair alarm set   Nurse Communication Mobility status        Time: 1115-1140 OT Time Calculation (min): 25 min  Charges: OT General Charges $OT Visit: 1 Visit OT Treatments $Self Care/Home Management : 23-37 mins  [place]. Yazmen Briones, COTA/L Acute  Rehabilitation Services [place] 06/15/2020, 2:56 PM

## 2020-06-16 ENCOUNTER — Inpatient Hospital Stay (HOSPITAL_COMMUNITY): Payer: Medicare HMO

## 2020-06-16 ENCOUNTER — Encounter (HOSPITAL_COMMUNITY)
Admission: EM | Disposition: A | Discharge status: Rehabilitation Facility | Payer: Self-pay | Source: Home / Self Care | Attending: Cardiology

## 2020-06-16 DIAGNOSIS — I5023 Acute on chronic systolic (congestive) heart failure: Secondary | ICD-10-CM | POA: Diagnosis not present

## 2020-06-16 DIAGNOSIS — R570 Cardiogenic shock: Secondary | ICD-10-CM | POA: Diagnosis not present

## 2020-06-16 DIAGNOSIS — I639 Cerebral infarction, unspecified: Secondary | ICD-10-CM | POA: Diagnosis not present

## 2020-06-16 LAB — SURGICAL PCR SCREEN
MRSA, PCR: NEGATIVE
Staphylococcus aureus: NEGATIVE

## 2020-06-16 LAB — HEPATITIS PANEL, ACUTE
HCV Ab: NONREACTIVE
Hep A IgM: NONREACTIVE
Hep B C IgM: NONREACTIVE
Hepatitis B Surface Ag: NONREACTIVE

## 2020-06-16 LAB — COMPREHENSIVE METABOLIC PANEL
ALT: 82 U/L — ABNORMAL HIGH (ref 0–44)
AST: 67 U/L — ABNORMAL HIGH (ref 15–41)
Albumin: 3.1 g/dL — ABNORMAL LOW (ref 3.5–5.0)
Alkaline Phosphatase: 66 U/L (ref 38–126)
Anion gap: 11 (ref 5–15)
BUN: 20 mg/dL (ref 8–23)
CO2: 23 mmol/L (ref 22–32)
Calcium: 8.9 mg/dL (ref 8.9–10.3)
Chloride: 105 mmol/L (ref 98–111)
Creatinine, Ser: 1.11 mg/dL (ref 0.61–1.24)
GFR, Estimated: >60 mL/min (ref >60)
Glucose, Bld: 108 mg/dL — ABNORMAL HIGH (ref 70–99)
Potassium: 4.5 mmol/L (ref 3.5–5.1)
Sodium: 139 mmol/L (ref 135–145)
Total Bilirubin: 1.3 mg/dL — ABNORMAL HIGH (ref 0.3–1.2)
Total Protein: 7.3 g/dL (ref 6.5–8.1)

## 2020-06-16 LAB — GLUCOSE, CAPILLARY
Glucose-Capillary: 103 mg/dL — ABNORMAL HIGH (ref 70–99)
Glucose-Capillary: 110 mg/dL — ABNORMAL HIGH (ref 70–99)
Glucose-Capillary: 111 mg/dL — ABNORMAL HIGH (ref 70–99)
Glucose-Capillary: 124 mg/dL — ABNORMAL HIGH (ref 70–99)
Glucose-Capillary: 91 mg/dL (ref 70–99)
Glucose-Capillary: 94 mg/dL (ref 70–99)

## 2020-06-16 LAB — AMMONIA: Ammonia: 57 umol/L — ABNORMAL HIGH (ref 9–35)

## 2020-06-16 LAB — MAGNESIUM: Magnesium: 2 mg/dL (ref 1.7–2.4)

## 2020-06-16 LAB — HEPARIN LEVEL (UNFRACTIONATED): Heparin Unfractionated: <0.1 IU/mL — ABNORMAL LOW (ref 0.30–0.70)

## 2020-06-16 SURGERY — ICD IMPLANT

## 2020-06-16 MED ORDER — HEPARIN (PORCINE) 25000 UT/250ML-% IV SOLN
1550.0000 [IU]/h | INTRAVENOUS | Status: DC
  Administered 2020-06-16: 1200 [IU]/h via INTRAVENOUS
  Administered 2020-06-17: 1400 [IU]/h via INTRAVENOUS
  Administered 2020-06-18 (×2): 1550 [IU]/h via INTRAVENOUS
  Filled 2020-06-16 (×4): qty 250

## 2020-06-16 NOTE — Progress Notes (Addendum)
  Speech Language Pathology Treatment: Cognitive-Linquistic  Patient Details Name: [name] MRN: [id number] DOB: [birth year] Today's Date: 06/16/2020 Time: 4010-2725 SLP Time Calculation (min) (ACUTE ONLY): 22 min  Assessment / Plan / Recommendation Clinical Impression  Pt was seen for cognitive-linguistic treatment with his wife present. Pt's wife reported that the pt's cognition is approximately 50% back to baseline and that it is significantly improved from admission. He was oriented x4 during this session with independent use of visual aids for the date. Pt's reported that he attributed his cognitive-linguistic difficulty to his mother and sibling passing away two and three years prior. He required cues for focused attention across tasks and benefited from repetition. Pt achieved 50% accuracy with time management problems increasing to 100% with cues. He demonstrated 100% accuracy with problem solving related to safety. He completed a 3-digit mental manipulation task with 100% accuracy. He demonstrated 20% accuracy with this task when four digits were provided increasing to 60% with cues and repetition. He required intermittent cueing for details and steps during a verbal sequencing task. SLP will continue to follow pt.    HPI HPI: 79 yoM with extensive cardiac history with recent STEMI 04/2020, D/Cd home on 10/23 with LIfevest, per notes was not using. Admitted 11/21 with Admitted 11/21 VT arrest, Acute on Chronic systolic HF with biventricular failure ->cardiogenic shock . Intubated and emergently taken to cath lab for temporary transvenous pacemaker and coronary angiogram.  Extubated 11/23 then reintubated four hours later, extubated 11/26.        SLP Plan  Continue with current plan of care       Recommendations  Diet recommendations: Regular;Thin liquid Liquids provided via: Cup;Straw Medication Administration: Whole meds with puree Supervision: Patient able to self  feed Compensations: Slow rate;Small sips/bites Postural Changes and/or Swallow Maneuvers: Seated upright 90 degrees                Oral Care Recommendations: Oral care BID Follow up Recommendations: None SLP Visit Diagnosis: Dysphagia, pharyngeal phase (R13.13) Plan: Continue with current plan of care       Jakeb Lamping I. Hardin Negus, [place], [place] Office number [phone] Pager [place] 06/16/2020, 11:33 AM

## 2020-06-16 NOTE — Progress Notes (Signed)
Inpatient Rehabilitation-Admissions Coordinator   Please see my screening note from 06/14/20. Will place CIR consult order per protocol.   Raechel Ache, OTR/L  Rehab Admissions Coordinator  [phone] 06/16/2020 12:36 PM

## 2020-06-16 NOTE — Progress Notes (Signed)
Physical Therapy Treatment Patient Details Name: [name] MRN: [id number] DOB: 23-Aug-1955 Today's Date: 06/16/2020    History of Present Illness 64 yo with a hx as stated above who presented to [facility] 06/04/20 after having a pre-syncopal symptoms including dizzinezss while at church earlier today found to be in VT (not wearing Life Vest as prescribed on recent hospital discharge) on EMS arrival. Has not been compliant with LifeVest stating the battery pack too bulky. PMH of CAD (s/p CABG in 2014 with LIMA-LAD, SVG-OM2 and SVG-RCA, s/p angioplasty to proximal SVG-RCA and DES to SVG-RCA anastomosis in 03/2019), chronic combined systolic and diastolic CHF/ICM (EF 46-96% by echo in 06/2019), HTN, HLD, prior CVA and recent STEMI with angioplasty of the distal bypass graft 05/04/20. Admitted 06/04/20 for treatment of wide complex tachycardia. Intubated 11/21-Extubated 11/23 and reintubated extubated 11/26 R radial aline 11/23 TEE 11/24: EF 30-35% no evidence of LV clot.  11/30 pt noted to have increased R sided weakness by OT, code stroke called MRI revealed Small acute infarcts involving the left frontal precentral gyrus    PT Comments    Pt progressing with mobility. Min assist bed mobility, min assist transfers, and mod assist ambulation 5' with RW. He presents with unsteady gait and poor coordination. Mobility limited this session due to fatigue/NPO status. Pt has been NPO today for procedure which was cancelled. Pt now with diet order and awaiting lunch tray. Pt in recliner at end of session.    Follow Up Recommendations  CIR     Equipment Recommendations  Rolling walker with 5" wheels    Recommendations for Other Services       Precautions / Restrictions Precautions Precautions: Fall;Other (comment) Precaution Comments: Cortrak    Mobility  Bed Mobility Overal bed mobility: Needs Assistance Bed Mobility: Supine to Sit     Supine to sit: HOB elevated;Min assist     General bed  mobility comments: +rail, increased time  Transfers Overall transfer level: Needs assistance Equipment used: Rolling walker (2 wheeled) Transfers: Sit to/from Stand   Stand pivot transfers: Min assist          Ambulation/Gait Ambulation/Gait assistance: Mod assist   Assistive device: Rolling walker (2 wheeled) Gait Pattern/deviations: Decreased step length - left;Decreased step length - right;Trunk flexed Gait velocity: decreased Gait velocity interpretation: <1.31 ft/sec, indicative of household ambulator General Gait Details: Unsteady. Poor coordination. Cues for sequencing and safety.   Stairs             Wheelchair Mobility    Modified Rankin (Stroke Patients Only) Modified Rankin (Stroke Patients Only) Pre-Morbid Rankin Score: No symptoms Modified Rankin: Moderately severe disability     Balance Overall balance assessment: Needs assistance Sitting-balance support: Feet supported;No upper extremity supported Sitting balance-Leahy Scale: Fair     Standing balance support: Bilateral upper extremity supported;During functional activity Standing balance-Leahy Scale: Poor Standing balance comment: reliant on external support                            Cognition Arousal/Alertness: Awake/alert Behavior During Therapy: Impulsive Overall Cognitive Status: Impaired/Different from baseline Area of Impairment: Orientation;Memory;Following commands;Safety/judgement;Awareness;Problem solving                 Orientation Level: Disoriented to;Situation   Memory: Decreased short-term memory Following Commands: Follows one step commands inconsistently;Follows multi-step commands inconsistently Safety/Judgement: Decreased awareness of safety;Decreased awareness of deficits Awareness: Emergent Problem Solving: Difficulty sequencing;Requires verbal cues;Requires tactile cues General Comments:  multimodal cues for safety      Exercises       General Comments General comments (skin integrity, edema, etc.): VSS      Pertinent Vitals/Pain Pain Assessment: No/denies pain    Home Living                      Prior Function            PT Goals (current goals can now be found in the care plan section) Acute Rehab PT Goals Patient Stated Goal: go home  Progress towards PT goals: Progressing toward goals    Frequency    Min 4X/week      PT Plan Frequency needs to be updated    Co-evaluation              AM-PAC PT "6 Clicks" Mobility   Outcome Measure  Help needed turning from your back to your side while in a flat bed without using bedrails?: None Help needed moving from lying on your back to sitting on the side of a flat bed without using bedrails?: A Little Help needed moving to and from a bed to a chair (including a wheelchair)?: A Little Help needed standing up from a chair using your arms (e.g., wheelchair or bedside chair)?: A Little Help needed to walk in hospital room?: A Lot Help needed climbing 3-5 steps with a railing? : A Lot 6 Click Score: 17    End of Session Equipment Utilized During Treatment: Gait belt Activity Tolerance: Patient tolerated treatment well Patient left: in chair;with call bell/phone within reach;with family/visitor present Nurse Communication: Mobility status PT Visit Diagnosis: Unsteadiness on feet (R26.81);Other abnormalities of gait and mobility (R26.89);Muscle weakness (generalized) (M62.81);Difficulty in walking, not elsewhere classified (R26.2)     Time: 6378-5885 PT Time Calculation (min) (ACUTE ONLY): 16 min  Charges:  $Gait Training: 8-22 mins                     Lorrin Goodell, PT  Office # [phone] Pager [phone]    Lorriane Shire 06/16/2020, 1:20 PM

## 2020-06-16 NOTE — Progress Notes (Addendum)
Patient ID: [name], male   DOB: Mar 21, 1956, 64 y.o.   MRN: [id number]     Advanced Heart Failure Rounding Note  PCP-Cardiologist: Sanda Klein, MD   Subjective:   Feeling ok today. Denies SOB. Denies chest pain.   Objective:   Weight Range: 87.7 kg Body mass index is 30.28 kg/m.   Vital Signs:   Temp:  [97.7 F (36.5 C)-98.6 F (37 C)] 97.9 F (36.6 C) (12/03 0800) Pulse Rate:  [74-97] 74 (12/03 0800) Resp:  [17-24] 22 (12/03 0802) BP: (146-178)/(92-117) 152/112 (12/03 0802) SpO2:  [91 %-96 %] 95 % (12/03 0800) Weight:  [87.7 kg] 87.7 kg (12/03 0500) Last BM Date: 06/15/20  Weight change: Filed Weights   06/14/20 0500 06/15/20 0427 06/16/20 0500  Weight: 88.2 kg 88.8 kg 87.7 kg    Intake/Output:   Intake/Output Summary (Last 24 hours) at 06/16/2020 1035 Last data filed at 06/16/2020 0700 Gross per 24 hour  Intake 907.8 ml  Output 275 ml  Net 632.8 ml      Physical Exam    General:  Well appearing. No resp difficulty HEENT: normal except Cortrak Neck: supple. no JVD. Carotids 2+ bilat; no bruits. No lymphadenopathy or thryomegaly appreciated. Cor: PMI nondisplaced. Regular rate & rhythm. No rubs, gallops or murmurs. Lungs: clear Abdomen: soft, nontender, nondistended. No hepatosplenomegaly. No bruits or masses. Good bowel sounds. Extremities: no cyanosis, clubbing, rash, edema Neuro: alert & orientedx3, cranial nerves grossly intact. moves all 4 extremities w/o difficulty. Affect pleasant   Telemetry   SR with NSVT 70s   Labs    CBC Recent Labs    06/14/20 0548  WBC 14.2*  HGB 14.4  HCT 41.5  MCV 88.7  PLT 093   Basic Metabolic Panel Recent Labs    06/13/20 1947 06/13/20 1947 06/14/20 0548 06/14/20 0548 06/15/20 0414 06/16/20 0125 06/16/20 0719  NA  --   --  139   < > 141  --  139  K  --   --  3.3*   < > 3.6  --  4.5  CL  --   --  104   < > 105  --  105  CO2  --   --  23   < > 23  --  23  GLUCOSE  --   --  127*   < > 134*   --  108*  BUN  --   --  19   < > 20  --  20  CREATININE  --   --  0.99   < > 1.05  --  1.11  CALCIUM  --   --  8.3*   < > 8.5*  --  8.9  MG 1.8   < > 1.9   < > 2.1 2.0  --   PHOS 2.0*  --  2.1*  --   --   --   --    < > = values in this interval not displayed.   Liver Function Tests Recent Labs    06/16/20 0719  AST 67*  ALT 82*  ALKPHOS 66  BILITOT 1.3*  PROT 7.3  ALBUMIN 3.1*   No results for input(s): LIPASE, AMYLASE in the last 72 hours. Cardiac Enzymes No results for input(s): CKTOTAL, CKMB, CKMBINDEX, TROPONINI in the last 72 hours.  BNP: BNP (last 3 results) Recent Labs    05/03/20 2330 05/04/20 0536  BNP 1,169.8* 1,408.4*    ProBNP (last 3 results) No results for input(s):  PROBNP in the last 8760 hours.   D-Dimer No results for input(s): DDIMER in the last 72 hours. Hemoglobin A1C Recent Labs    06/14/20 0548  HGBA1C 5.8*   Fasting Lipid Panel Recent Labs    06/14/20 0548  CHOL 131  HDL 25*  LDLCALC 73  TRIG 165*  CHOLHDL 5.2   Thyroid Function Tests No results for input(s): TSH, T4TOTAL, T3FREE, THYROIDAB in the last 72 hours.  Invalid input(s): FREET3  Other results:   Imaging    No results found.   Medications:     Scheduled Medications: .  stroke: mapping our early stages of recovery book   Does not apply Once  . amiodarone  200 mg Per Tube BID  . aspirin  81 mg Per Tube Daily  . atorvastatin  80 mg Per Tube q1800  . chlorhexidine  15 mL Mouth Rinse BID  . Chlorhexidine Gluconate Cloth  6 each Topical Daily  . clopidogrel  75 mg Per Tube Daily  . digoxin  0.125 mg Per Tube Daily  . escitalopram  10 mg Per Tube Daily  . ezetimibe  10 mg Per Tube Daily  . feeding supplement (PROSource TF)  45 mL Per Tube QID  . gentamicin irrigation  80 mg Irrigation To Cath  . lactulose  20 g Per Tube TID  . mouth rinse  15 mL Mouth Rinse q12n4p  . sacubitril-valsartan  1 tablet Oral BID  . sodium chloride flush  3 mL Intravenous Q12H   . spironolactone  25 mg Per Tube Daily    Infusions: . sodium chloride 50 mL/hr at 06/16/20 0700  . sodium chloride    .  ceFAZolin (ANCEF) IV    . feeding supplement (OSMOLITE 1.5 CAL) Stopped (06/16/20 0107)    PRN Medications: acetaminophen (TYLENOL) oral liquid 160 mg/5 mL, albuterol, guaiFENesin, loperamide HCl, nitroGLYCERIN, ondansetron (ZOFRAN) IV, sodium chloride flush    Patient Profile   [name] a 64 y.o.malewith aPMH of CAD (s/p CABG in 2014 with LIMA-LAD, SVG-OM2 and SVG-RCA, s/p angioplasty to proximal SVG-RCA and DES to SVG-RCA anastomosisin 03/2019), chronic combined systolic and diastolic CHF/ICM (EF 69-62% by echo in 06/2019), HTN, HLD,prior CVAand recent STEMIwith angioplasty of the distal bypass graft10/21/21 d/c home with Life Vest.  Presented in VT requiring multiple amio bolus and lido but developed asystole.  Underwent external pacing and atropine then sent to Cath lab.      Assessment/Plan   1. VT arrest -> asystole - emergent cath 06/04/20 with stable coronary anatomy - suspect scar mediated VT - 12/1 had long runs of NSVT. No Vt overnight.  - Continue PO amiodarone 200 mg bid,  - TTVP discontinued 11/27, no further bradycardia.  - EP following. Will need dual chamber ICD prior to d/c likely on Monday.   - Keep K > 4.0 Mg > 2.0.   2. Acute on Chronic systolic HF with biventricular failure -> cardiogenic shock  - Echo 05/04/20 EF 25%. Moderate RV to severe RV dysfunction and possible LV thrombus. LifeVest placed. D/c'd home on 05/06/20 - RHC on admit with preserved output and well compensated filling pressures.  - Echo remains unchanged from October 2021, EF 25-30%, mod LVH, global hypokineses, aortic root dilatation 3.9 and ascending aorta 4.0 - TEE 11/24 EF 30-35% - Volume status stable.  - Continue digoxin 0.125 mcg  - Continue with Entresto dose 97-103 mg BID - Continue spironolactone 25 daily.   - Renal function stable.   3.  Acute ischemic CVA/ ? Component of Encephalopathy - Stat Head CT 11/30 for AMS + RUE weakness showed no acute abnormality. Hypodensity right posterior cerebellum most consistent with chronic infarct. -MRI revealed scattered embolic infarcts in L hemisphere w/ some edema but no shift - LDL 73, HDL 25, Trig 165, Chol 131, A1c 5.8% - Neurology recommendations appreciated, discussed BP parameters (Goal SBP 130-140) will need to follow up in stroke clinic NP at [facility] in 4 weeks - also suspect multifactorial toxic metabolic encephalopathy - NH3- 46, given lactulose (BM overnight)  - U/A pending - Oriented x 3. Consult PT/OT and SLP . Recommending inpatient rehab.  - on ASA 81, plavix 75 mg, zetia 10, statin 80 and heparin gtt  4. Bilateral PE and bilateral DVTs - incidental finding on neck CT, lobar and segmental pulmonary emboli noted in bilateral upper lobes -u/s acute DVT R posterior tibial veins and right peroneal veins & left gastrocnemius veins and left posterior tibial veins.  - ICD delayed. Start back on heparin drip. .    5. CAD  - s/p CABG  - Inferior STEMI 05/03/20 s/p POBA SVG->PDA - Emergent cath 06/04/20 with stable coronary anatomy - C/w DAPT/statin via NGT  - No chest pain.   6. Possible LV thrombus on echo 05/04/20 - Repeat echo apical filling defect suspicious for LV thrombus vs false tendon -TEE 11/24 no LV thrombus - Off heparin for procedure.   7. Acute hypoxic respiratory failure - intubated in setting of cardiac arrest - Extubated 11/23 at 14:22, required bipap then re-intubated 11/23 at 18:38 - Extubated 11/26. - Stable saturations.   8.  LL PNA (HCAP) - COVID, Flu negative; lactic acid 1.0 -> 1.3 -> 0.9 - PCT 0.22 -> 0.21 ->  0.31 -> 0.53 - Cultures normal flora  - Completed tx w/ Vanc + cefepime  9.  AKI - resolved  - baseline Cr on previous admission around 1.3 - Foley exchanged 11/23 d/t clot causing obstruction - Admission Cr 1.78, creatinine 1.11  today.   10.  Thrombocytopenia - Resolved  11. Dysphagia - Failed initial swallow evaluation. Has CorTrak.  - Hoarse/quiet voice, ?vocal cord damage.  - Has Cortrak for meds.   12. Deconditioning - OOB and in chair today  - mobilize w/ PT/OT    ICD on hold but likely Monday. EP following. Will restart Heparin drip.     Length of Stay: [place], NP  06/16/2020, 10:35 AM  Advanced Heart Failure Team Pager [phone] (M-F; [place])  Please contact [facility] for night-coverage after hours (4p -7a ) and weekends on [email or web address]   Patient seen and examined with the above-signed Advanced Practice Provider and/or Housestaff. I personally reviewed laboratory data, imaging studies and relevant notes. I independently examined the patient and formulated the important aspects of the plan. I have edited the note to reflect any of my changes or salient points. I have personally discussed the plan with the patient and/or family.  Improving but still mildly confused at times. Stable from HF perspective. Renal function stable. Still with some NSVT on monitor.   General:  Sitting up in bed. No resp difficulty HEENT: normal + cor-trak  Neck: supple. no JVD. Carotids 2+ bilat; no bruits. No lymphadenopathy or thryomegaly appreciated. Cor: PMI nondisplaced. Regular rate & rhythm. No rubs, gallops or murmurs. Lungs: clear Abdomen: soft, nontender, nondistended. No hepatosplenomegaly. No bruits or masses. Good bowel sounds. Extremities: no cyanosis, clubbing, rash, edema Neuro: alert & orientedx3,  cranial nerves grossly intact. moves all 4 extremities w/o difficulty. Affect pleasant  Improving but still several active medical issues. I have spoken with EP and think may be best to hold off on device implant until a bit more stable and closer to d/c. Add low-dose b-blocker for NSVT. Continue current HF regimen. HF team will see again on Monday.   Glori Bickers, MD  1:06 PM

## 2020-06-16 NOTE — Consult Note (Signed)
Physical Medicine and Rehabilitation Consult   Reason for Consult: Stroke with functional deficits.  Referring Physician: Dr. Sherral Hammers.    HPI: [name] is a 64 y.o. male with history of CAD s/p CABG with recent NSTEMI 10/20 with revascularization and question of LV thrombus and EF 25% therefore LifeVest placed. He was admitted on 06/04/20 with VT arrest in setting of severe ICM and treated with required  Amiodarone, lidocaine, atropine as well as external pacing and was intubated for air way protection. He underwent cardiac cath showing normal anatomy. 2D echo showed EF 25-30% with linear filling defect and was started on IV heparin due to concerns of thrombus. He had difficulty with vent wean and developed fevers with leucocytosis and purulent respiratory secretions. . Cefipime/Vanc added due to concern so of PNA.  TEE done 11/24 showing EF 30-35% and no thrombus, PFO or ASD noted. He was maintained on IV amiodarone with plans for ICD. He tolerated extubation and NGT placed for nutritional support.  placement post extubation.  On 11/30, he was found to have RUE weakness as well as speech difficulty with confusion therefore code stroke initiated.     CTA head neck was negative for LVO or ischemia but showed lobar and segmental PE in upper lungs. CTA lungs done for follow up and showed bilateral multifocal PE --majority acute in addition to subacute and chronic thrombus in LLL PA.  MRI brain done revealing small acute infarcts in left frontal precentral gyrus and juxtacortical left frontal white matter with mild edema. Dr. Rory Percy recommended addition of anticoagulation for treatment of embolic stroke as well as bilateral PE's. He had 60 minutes of slavo's of NSVT on 12/01 with plans for ICD placement but has had intermittent issues with confusion. Per chart, patient has been having intermittent confusion with memory deficits for the past year. Cardiology would like to defer ICD placement till  next week depending on WE events.Therapy ongoing and patient noted to have unsteady gait with fatigue and SOB with activity. CIR recommended due to functional decline.    Review of Systems  Constitutional: Negative for fever.  HENT: Negative for hearing loss.   Eyes: Negative for blurred vision and double vision.  Respiratory: Positive for shortness of breath (if he pushes himself). Negative for cough.   Cardiovascular: Negative for chest pain and palpitations.  Gastrointestinal: Positive for heartburn. Negative for abdominal pain and nausea.  Musculoskeletal: Positive for falls. Negative for joint pain and myalgias.  Skin: Negative for rash.  Neurological: Positive for weakness. Negative for dizziness and headaches.  Psychiatric/Behavioral: Positive for memory loss.     Past Medical History:  Diagnosis Date  . Borderline diabetic   . Coronary artery disease    a. s/p CABG in 2014 with LIMA-LAD, SVG-OM2 and SVG-RC b. 03/2019: STEMI and s/p angioplasty to proximal SVG-RCA and DES to SVG-RCA anastomosis c. 04/2020: recurrent STEMI with angioplasty of SVG-RCA  . Dyslipidemia 01/11/2013  . Hiatal hernia   . Hypertension   . Mini stroke ([place])    2  . S/P CABG x 3   . STEMI (ST elevation myocardial infarction) ([place])   . Stroke [facility])     Past Surgical History:  Procedure Laterality Date  . CARDIAC CATHETERIZATION    . CORONARY ARTERY BYPASS GRAFT N/A 09/01/2012   Procedure: CORONARY ARTERY BYPASS GRAFTING (CABG);  Surgeon: Gaye Pollack, MD;  Location: [place];  Service: Open Heart Surgery;  Laterality: N/A;  Coronary Artery Bypass Grafting  Times Three Using Left Internal Mammary Artery and Left Saphenous leg Vein Harvested Endoscopically  . CORONARY STENT INTERVENTION N/A 03/23/2019   Procedure: CORONARY STENT INTERVENTION;  Surgeon: Lorretta Harp, MD;  Location: [place] [facility];  Service: Cardiovascular;  Laterality: N/A;  . CORONARY/GRAFT ACUTE MI REVASCULARIZATION N/A 03/23/2019    Procedure: Coronary/Graft Acute MI Revascularization;  Surgeon: Lorretta Harp, MD;  Location: [place] CV LAB;  Service: Cardiovascular;  Laterality: N/A;  . CORONARY/GRAFT ACUTE MI REVASCULARIZATION N/A 05/03/2020   Procedure: Coronary/Graft Acute MI Revascularization;  Surgeon: Belva Crome, MD;  Location: [place] [facility];  Service: Cardiovascular;  Laterality: N/A;  . IABP INSERTION N/A 06/04/2020   Procedure: IABP Insertion;  Surgeon: Martinique, Peter M, MD;  Location: [place] [facility];  Service: Cardiovascular;  Laterality: N/A;  . INTRAOPERATIVE TRANSESOPHAGEAL ECHOCARDIOGRAM N/A 09/01/2012   Procedure: INTRAOPERATIVE TRANSESOPHAGEAL ECHOCARDIOGRAM;  Surgeon: Gaye Pollack, MD;  Location: [place] OR;  Service: Open Heart Surgery;  Laterality: N/A;  . LEFT HEART CATH AND CORONARY ANGIOGRAPHY N/A 03/23/2019   Procedure: LEFT HEART CATH AND CORONARY ANGIOGRAPHY;  Surgeon: Lorretta Harp, MD;  Location: [place] [facility];  Service: Cardiovascular;  Laterality: N/A;  . LEFT HEART CATH AND CORONARY ANGIOGRAPHY N/A 05/03/2020   Procedure: LEFT HEART CATH AND CORONARY ANGIOGRAPHY;  Surgeon: Belva Crome, MD;  Location: [place] [facility];  Service: Cardiovascular;  Laterality: N/A;  . LEFT HEART CATHETERIZATION WITH CORONARY ANGIOGRAM N/A 08/31/2012   Procedure: LEFT HEART CATHETERIZATION WITH CORONARY ANGIOGRAM;  Surgeon: Peter M Martinique, MD;  Location: [facility] CATH LAB;  Service: Cardiovascular;  Laterality: N/A;  . PERCUTANEOUS CORONARY STENT INTERVENTION (PCI-S) N/A 08/31/2012   Procedure: PERCUTANEOUS CORONARY STENT INTERVENTION (PCI-S);  Surgeon: Peter M Martinique, MD;  Location: [facility] CATH LAB;  Service: Cardiovascular;  Laterality: N/A;  . RIGHT/LEFT HEART CATH AND CORONARY/GRAFT ANGIOGRAPHY N/A 06/04/2020   Procedure: RIGHT/LEFT HEART CATH AND CORONARY/GRAFT ANGIOGRAPHY;  Surgeon: Martinique, Peter M, MD;  Location: [place] [facility] LAB;  Service: Cardiovascular;  Laterality: N/A;  . TEMPORARY  PACEMAKER N/A 06/04/2020   Procedure: TEMPORARY PACEMAKER;  Surgeon: Martinique, Peter M, MD;  Location: [place] CV LAB;  Service: Cardiovascular;  Laterality: N/A;    Family History  Problem Relation Age of Onset  . Cancer Father   . Hypertension Father   . Hyperlipidemia Father   . Heart attack Father   . Hypertension Sister   . Hypertension Brother   . Hyperlipidemia Brother   . Heart attack Sister   . Heart attack Brother     Social History: Cira Rue is retired. . 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 He  reports that he has quit smoking. His smoking use included pipe. He quit smokeless tobacco use about 51 years ago. He reports that he does not drink alcohol and does not use drugs.    Allergies: No Known Allergies    Medications Prior to Admission  Medication Sig Dispense Refill  . aspirin 81 MG chewable tablet Chew 81 mg by mouth daily.    . clopidogrel (PLAVIX) 75 MG tablet Take 1 tablet (75 mg total) by mouth daily. 90 tablet 3  . escitalopram (LEXAPRO) 10 MG tablet Take 1 tablet (10 mg total) by mouth daily. 30 tablet 11  . ezetimibe (ZETIA) 10 MG tablet Take 1 tablet (10 mg total) by mouth daily. 90 tablet 3  . metoprolol succinate (TOPROL-XL) 50 MG 24 hr tablet Take 1.5  tablets (75 mg total) by mouth daily. Take with or immediately following a meal. 135 tablet 1  . Multiple Vitamins-Minerals (ONE-A-DAY MENS 50+ ADVANTAGE) TABS Take 1 tablet by mouth daily with breakfast.    . nitroGLYCERIN (NITROSTAT) 0.4 MG SL tablet Place 1 tablet (0.4 mg total) under the tongue every 5 (five) minutes x 3 doses as needed for chest pain. 25 tablet 2  . Omega-3 Fatty Acids (FISH OIL) 1000 MG CAPS Take 1,000 mg by mouth in the morning and at bedtime.    . sacubitril-valsartan (ENTRESTO) 49-51 MG Take 1 tablet by mouth 2 (two) times daily. 60 tablet 1  . ALPRAZolam (XANAX) 0.5 MG tablet Take 1 tablet (0.5 mg total) by mouth at bedtime as needed for anxiety. 20  tablet 0    Home: Home Living Family/patient expects to be discharged to:: Private residence Living Arrangements: Spouse/significant other Available Help at Discharge: Family, Available 24 hours/day Type of Home: Mobile home Home Access: Stairs to enter [employer] of Steps: 4 Entrance Stairs-Rails: Right, Left Home Layout: One level Bathroom Shower/Tub: Chiropodist: Standard Home Equipment: None  Functional History: Prior Function Level of Independence: Independent Comments: Works partime Functional Status:  Mobility: Bed Mobility Overal bed mobility: Needs Assistance Bed Mobility: Supine to Sit Supine to sit: HOB elevated, Min assist Sit to supine: Mod assist General bed mobility comments: +rail, increased time Transfers Overall transfer level: Needs assistance Equipment used: Rolling walker (2 wheeled) Transfers: Sit to/from Stand Sit to Stand: Supervision Stand pivot transfers: Min guard General transfer comment: received in recliner.  Able to walk to sink for ADL Ambulation/Gait Ambulation/Gait assistance: Mod assist Gait Distance (Feet): 5 Feet Assistive device: Rolling walker (2 wheeled) Gait Pattern/deviations: Decreased step length - left, Decreased step length - right, Trunk flexed General Gait Details: Unsteady. Poor coordination. Cues for sequencing and safety. Gait velocity: decreased Gait velocity interpretation: <1.31 ft/sec, indicative of household ambulator    ADL: ADL Overall ADL's : Needs assistance/impaired Eating/Feeding: Independent, Sitting Eating/Feeding Details (indicate cue type and reason): Patient able to eat lunch this date: regular diet with thin liquids. Grooming: Wash/dry face, Standing, Supervision/safety Grooming Details (indicate cue type and reason): While sitting at EOB, pt washing his face. However, reaching for washcloth initially with reaching with RUE, having difficulty with coorindation and  grasp, and switching to reach with LUE. Asking pt which is his dominant hand, and he reports R. Pt proceeds to wash face with LUE as he is having dififculty coorindating RUE. Unable to bring wash cloth to face with R.  Upper Body Bathing: Supervision/ safety, Sitting Lower Body Bathing: Minimal assistance, Sit to/from stand Upper Body Dressing : Minimal assistance, Sitting Lower Body Dressing: Minimal assistance, Sit to/from stand Lower Body Dressing Details (indicate cue type and reason): Continued difficulty in coordination of movement Toilet Transfer: Min guard, RW, Ambulation Toilet Transfer Details (indicate cue type and reason): simualated to recliner, significant need for cues to pivot backwards as pt impulsively speeds up and begins to desend before he is at the chair Functional mobility during ADLs: Rolling walker, Min guard General ADL Comments: Increased shortness of breath noted.  Cognition: Cognition Overall Cognitive Status: Impaired/Different from baseline Arousal/Alertness: Awake/alert Orientation Level: Oriented to person, Oriented to situation, Disoriented to time, Disoriented to place Attention: Focused, Sustained Focused Attention: Appears intact Sustained Attention: Impaired Memory: Impaired Memory Impairment: Storage deficit, Retrieval deficit Awareness: Impaired Awareness Impairment: Emergent impairment Cognition Arousal/Alertness: Awake/alert Behavior During Therapy: WFL for tasks assessed/performed Overall  Cognitive Status: Impaired/Different from baseline Area of Impairment: Orientation, Memory, Following commands, Safety/judgement, Awareness, Problem solving Orientation Level: Person, Place, Situation Memory: Decreased short-term memory Following Commands: Follows one step commands with increased time, Follows multi-step commands inconsistently Safety/Judgement: Decreased awareness of safety, Decreased awareness of deficits Awareness: Emergent Problem  Solving: Slow processing, Requires verbal cues General Comments: multimodal cues for safety   Blood pressure (!) 165/95, pulse 73, temperature 97.7 F (36.5 C), temperature source Oral, resp. rate (!) 25, height 5\' 7"  (1.702 m), weight 87.7 kg, SpO2 93 %. Physical Exam  General: Alert and oriented x2, No apparent distress HEENT: Head is normocephalic, atraumatic, PERRLA, EOMI, sclera anicteric, oral mucosa pink and moist, dentition intact, ext ear canals clear,  Neck: Supple without JVD or lymphadenopathy Heart: Reg rate and rhythm. No murmurs rubs or gallops Chest: CTA bilaterally without wheezes, rales, or rhonchi; no distress Abdomen: Soft, non-tender, non-distended, bowel sounds positive. Extremities: No clubbing, cyanosis, or edema. Pulses are 2+ Skin: Clean and intact without signs of breakdown Neuro: Speech clear. Able to recall today's date. Able to follow basic commands without difficulty. RLE with decrease in fine motor control.  5/5 strength throughout. Psych: Pt's affect is appropriate. Pt is cooperative   Results for orders placed or performed during the hospital encounter of 06/04/20 (from the past 24 hour(s))  Glucose, capillary     Status: None   Collection Time: 06/15/20  5:18 PM  Result Value Ref Range   Glucose-Capillary 95 70 - 99 mg/dL  Glucose, capillary     Status: Abnormal   Collection Time: 06/15/20  8:49 PM  Result Value Ref Range   Glucose-Capillary 115 (H) 70 - 99 mg/dL  Surgical PCR screen     Status: None   Collection Time: 06/15/20 10:50 PM   Specimen: Nasal Mucosa; Nasal Swab  Result Value Ref Range   MRSA, PCR NEGATIVE NEGATIVE   Staphylococcus aureus NEGATIVE NEGATIVE  Urinalysis, Routine w reflex microscopic Urine, Clean Catch     Status: Abnormal   Collection Time: 06/15/20 11:20 PM  Result Value Ref Range   Color, Urine YELLOW YELLOW   APPearance HAZY (A) CLEAR   Specific Gravity, Urine 1.017 1.005 - 1.030   pH 8.0 5.0 - 8.0   Glucose, UA  50 (A) NEGATIVE mg/dL   Hgb urine dipstick LARGE (A) NEGATIVE   Bilirubin Urine NEGATIVE NEGATIVE   Ketones, ur NEGATIVE NEGATIVE mg/dL   Protein, ur 100 (A) NEGATIVE mg/dL   Nitrite POSITIVE (A) NEGATIVE   Leukocytes,Ua NEGATIVE NEGATIVE   RBC / HPF >50 (H) 0 - 5 RBC/hpf   WBC, UA 0-5 0 - 5 WBC/hpf   Bacteria, UA RARE (A) NONE SEEN   Squamous Epithelial / LPF 0-5 0 - 5   Mucus PRESENT   Glucose, capillary     Status: Abnormal   Collection Time: 06/15/20 11:52 PM  Result Value Ref Range   Glucose-Capillary 142 (H) 70 - 99 mg/dL  Magnesium     Status: None   Collection Time: 06/16/20  1:25 AM  Result Value Ref Range   Magnesium 2.0 1.7 - 2.4 mg/dL  Ammonia     Status: Abnormal   Collection Time: 06/16/20  1:25 AM  Result Value Ref Range   Ammonia 57 (H) 9 - 35 umol/L  Glucose, capillary     Status: Abnormal   Collection Time: 06/16/20  3:46 AM  Result Value Ref Range   Glucose-Capillary 103 (H) 70 - 99 mg/dL  Comprehensive  metabolic panel     Status: Abnormal   Collection Time: 06/16/20  7:19 AM  Result Value Ref Range   Sodium 139 135 - 145 mmol/L   Potassium 4.5 3.5 - 5.1 mmol/L   Chloride 105 98 - 111 mmol/L   CO2 23 22 - 32 mmol/L   Glucose, Bld 108 (H) 70 - 99 mg/dL   BUN 20 8 - 23 mg/dL   Creatinine, Ser 1.11 0.61 - 1.24 mg/dL   Calcium 8.9 8.9 - 10.3 mg/dL   Total Protein 7.3 6.5 - 8.1 g/dL   Albumin 3.1 (L) 3.5 - 5.0 g/dL   AST 67 (H) 15 - 41 U/L   ALT 82 (H) 0 - 44 U/L   Alkaline Phosphatase 66 38 - 126 U/L   Total Bilirubin 1.3 (H) 0.3 - 1.2 mg/dL   GFR, Estimated >60 >60 mL/min   Anion gap 11 5 - 15  Glucose, capillary     Status: Abnormal   Collection Time: 06/16/20  8:51 AM  Result Value Ref Range   Glucose-Capillary 111 (H) 70 - 99 mg/dL  Glucose, capillary     Status: None   Collection Time: 06/16/20 12:16 PM  Result Value Ref Range   Glucose-Capillary 91 70 - 99 mg/dL   No results found.   Assessment/Plan: Diagnosis: Acute embolic left sided  infarcts 1. Does the need for close, 24 hr/day medical supervision in concert with the patient's rehab needs make it unreasonable for this patient to be served in a less intensive setting? Yes 2. Co-Morbidities requiring supervision/potential complications: encephalopathy, deconditioning, elevated liver enzymes, leukocytosis, AKI, recurrent Vtach, cardiogenic shock 3. Due to bladder management, bowel management, safety, skin/wound care, disease management, medication administration, pain management and patient education, does the patient require 24 hr/day rehab nursing? Yes 4. Does the patient require coordinated care of a physician, rehab nurse, therapy disciplines of PT, OT, SLP to address physical and functional deficits in the context of the above medical diagnosis(es)? Yes Addressing deficits in the following areas: balance, endurance, locomotion, strength, transferring, bowel/bladder control, bathing, dressing, feeding, grooming, toileting, cognition and psychosocial support 5. Can the patient actively participate in an intensive therapy program of at least 3 hrs of therapy per day at least 5 days per week? Yes 6. The potential for patient to make measurable gains while on inpatient rehab is excellent 7. Anticipated functional outcomes upon discharge from inpatient rehab are modified independent  with PT, modified independent with OT, modified independent with SLP. 8. Estimated rehab length of stay to reach the above functional goals is: 7-10 days 9. Anticipated discharge destination: Home 10. Overall Rehab/Functional Prognosis: excellent  RECOMMENDATIONS: This patient's condition is appropriate for continued rehabilitative care in the following setting: CIR Patient has agreed to participate in recommended program. Yes Note that insurance prior authorization may be required for reimbursement for recommended care.  Comment: Thank you for this consult. Admission coordinator to follow.   I  have personally performed a face to face diagnostic evaluation, including, but not limited to relevant history and physical exam findings, of this patient and developed relevant assessment and plan.  Additionally, I have reviewed and concur with the physician assistant's documentation above.  Leeroy Cha, MD  Bary Leriche, PA-C 06/16/2020

## 2020-06-16 NOTE — Progress Notes (Signed)
ANTICOAGULATION CONSULT NOTE  Pharmacy Consult for heparin Indication: pulmonary embolus  No Known Allergies  Patient Measurements: Height: 5\' 7"  (170.2 cm) Weight: 87.7 kg (193 lb 5.5 oz) IBW/kg (Calculated) : 66.1 Heparin Dosing Weight: 88kg  Vital Signs: Temp: 97.9 F (36.6 C) (12/03 0800) Temp Source: Oral (12/03 0800) BP: 152/112 (12/03 0802) Pulse Rate: 74 (12/03 0800)  Labs: Recent Labs    06/13/20 1947 06/14/20 0548 06/15/20 0414 06/16/20 0719  HGB  --  14.4  --   --   HCT  --  41.5  --   --   PLT  --  363  --   --   HEPARINUNFRC 0.50 0.33 0.27*  --   CREATININE  --  0.99 1.05 1.11    Estimated Creatinine Clearance: 69.2 mL/min (by C-G formula based on SCr of 1.11 mg/dL).   Assessment: 21 yoM admitted with VT and concern for ACS. Cath clean and heparin resumed for presumed LV thrombus. Repeat TEE 11/24 showed no thrombus so heparin discontinued. Pt noted to have acute CVA 11/30 am, CT angio demonstrated incidental finding of PE. Neurology cleared stroke protocol heparin, CCM consulted to begin heparin infusion. Will defer bolus and target lower goal.  Heparin off for ICD, now postponed, will resume IV heparin.  Goal of Therapy:  Heparin level 0.3-0.5 units/ml Monitor platelets by anticoagulation protocol: Yes   Plan:  -Restart heparin 1200 units/h -Check 6h heparin level   Arrie Senate, PharmD, [place], [facility] Clinical Pharmacist [phone] Please check AMION for all [facility] numbers 06/16/2020

## 2020-06-16 NOTE — Progress Notes (Signed)
ANTICOAGULATION CONSULT NOTE  Pharmacy Consult for heparin Indication: pulmonary embolus  No Known Allergies  Patient Measurements: Height: 5\' 7"  (170.2 cm) Weight: 87.7 kg (193 lb 5.5 oz) IBW/kg (Calculated) : 66.1 Heparin Dosing Weight: 88kg  Vital Signs: Temp: 97.9 F (36.6 C) (12/03 1645) Temp Source: Oral (12/03 1645) BP: 143/87 (12/03 1645) Pulse Rate: 83 (12/03 1645)  Labs: Recent Labs    06/14/20 0548 06/15/20 0414 06/16/20 0719 06/16/20 1649  HGB 14.4  --   --   --   HCT 41.5  --   --   --   PLT 363  --   --   --   HEPARINUNFRC 0.33 0.27*  --  <0.10*  CREATININE 0.99 1.05 1.11  --     Estimated Creatinine Clearance: 69.2 mL/min (by C-G formula based on SCr of 1.11 mg/dL).   Assessment: 35 yoM admitted with VT and concern for ACS. Cath clean and heparin resumed for presumed LV thrombus. Repeat TEE 11/24 showed no thrombus so heparin discontinued. Patient noted to have acute CVA 11/30 am, CT angio demonstrated incidental finding of PE. Neurology cleared stroke protocol heparin, CCM consulted pharmacy to begin heparin infusion. Will defer bolus and target lower goal.  Heparin resumed on 1200 units/hr (12 ml/hr) this PM after ICD was cancelled. First heparin level came back undetectable at <0.10. RN states patient got up with physical therapy a couple times - Heparin infusion was beeping occluded but unsure for how long but notes <1hr. Due to this, will be cautiously increase and recheck. No bleeding noted.   Goal of Therapy:  Heparin level 0.3-0.5 units/ml Monitor platelets by anticoagulation protocol: Yes   Plan:  -Increase Heparin to 1300 units/hr. -Re-check 6h heparin level -Daily heparin level and CBC while on therapy  Sloan Leiter, PharmD, BCPS, BCCCP Clinical Pharmacist Please refer to [facility] for [place] numbers 06/16/2020

## 2020-06-16 NOTE — Progress Notes (Signed)
PROGRESS NOTE    [name]  OJJ:[id number] DOB: [birth year] DOA: 06/04/2020 PCP: [name], MD     Brief Narrative:  64 yo WM PMHx CAD w/ previous CABG in 2014, chronic combined systolic and diastolic HF/ ICM, HTN, HLD, CVA, and recent inferior STEMI 05/03/20 secondary to thrombotic occlusion of the SVG to the RCA, likely due to missed ticagrelor doses.  EF noted to be 25% with moderate RV to severe RV dysfunction and possible LV thrombus 05/04/2020.  He was discharged home on 10/23 on a LifeVest, but apparently not using.    On 11/21, he developed dizziness at church.  EMS found patient in wide complex tachycardia treated with amiodarone and lidocaine with successful conversion to NSR.  On arrival to ER, patient had recurrence of his VT but hemodynamically tolerating.  He was given additional amiodarone and lidocaine gtt started, however developed severe bradycardia requiring atropine and external pacing.  He was intubated and taken emergently for temporary transvenous pacemaker and coronary angiogram.  Returns to ICU sedated and intubated, PCCM consulted for further ventilator management.   Subjective: 12/3 afebrile overnight A/O x4, sitting in chair comfortably.  Negative CP, negative S OB.   Assessment & Plan: Covid vaccination; unvaccinated   Active Problems:   Respiratory failure with hypoxia (HCC)   Ventricular tachycardia (HCC)   Cardiogenic shock (HCC)   Acute stroke due to ischemia [facility])   Acute pulmonary embolism without acute cor pulmonale (HCC)  Recurrent VT/bradycardia -Underlying sinus node dysfunction -EP plans to place ICD on Monday.  Was going to place on Friday however wanted [place] to look a little further into possible liver dysfunction.  Cardiogenic shock/chronic systolic and diastolic CHF with biventricular failure -CHF team seeing patient -Strict ins and out -Daily weight -  CAD s/p CABG with recent inferior STEMI 04/2020  HTN  HLD  Acute  respiratory failure with hypoxia/pleural effusion    Bilateral lobar and segmental Pulmonary Embolism - incidental findings on stroke workup  - remains hemodynamically stable and on 2L [place] - bilateral venous dopplers to evaluate for DVT pending - depending on neuro recommendations, will need anticoagulation- likely heparin gtt for now and eventual DOAC    Dysphagia  Extubated 11/23 - Continue to wean oxygen as tolerated - Pulmonary hygiene- IS, PT, moblize, CPT, NT prn  - aspiration precautions; continue cortrak for EN/ meds; SLP following  Nausea secondary to exaggerated gag response secondary to stroke and recent intubation Improved, remains NPO for pacemaker insertion - SLP to evaluate - Nasogastric tube for medications.  May need a period of nasogastric feeding.  Leukocytosis/ fever presumed HCAP - Complete 7 days of Cefepime 11/29; cultures negative/ normal flora; continue to monitor clinically  AKI- resolved - trend BMET, renal dose meds and avoid nephrotoxins - strict I/Os  Encephalopathy , Acute CVA-Embolic L infarcts Seen by Neurology P: -serial neuro exams -Echocardiogram with bubble -heparin gtt -follow up by stroke tream - delirium precautions - pending ammonia/ UA   Deconditioning - PT/OT  Elevated liver enzymes -Per patient and wife has significant history within his family of liver disease, some of it EtOH related.  Patient does not drink. -Daily ammonia level -12/3 acute hepatitis panel pending -12/3 US abdomen liver   DVT prophylaxis: Heparin drip Code Status: Full Family Communication: 12/3 wife at bedside discussed plan of care answered all questions Status is: Inpatient    Dispo: The patient is from: Home  Anticipated d/c is to:??              Anticipated d/c date is: 92 / 7              Patient currently unstable      Consultants:  HF tm PCCM EP Neurology   Procedures/Significant Events:  11/21 Admitted  cardiology/ intubated/ L/RHC, TVP, PA catheter 11/22  Remains on NE, amio, heparin gtt, decreased UOP.  CVP 8, CI 1.34, co-ox 60-66%, started on milrinone 11/23 Tmax 101.7, w/ increased respiratory secretions prompted the initiation of antibiotics. co-ox 72%, CVP 12, CI 3.2, SVR 1090. Able to titrate off sedation and decrease NE; extubated 14:22 then re-intubated at 18:38.    11/21 R/LHC >   Severe 3 vessel occlusive CAD. 2. Patent LIMA to the LAD 3. Patent SVG to OM1 4. Patent SVG to RCA. Much improved flow from prior PCI. 5. Mildly elevated LVEDP 17 mm Hg 6. Successful placement of temporary transvenous pacemaker.  11/24 TEE >> neg for LV thrombus; EF 30-35%.Inferolateral HK  11/30 CT head>> 1. No acute abnormality. Generalized atrophy 2. Hypodensity right posterior cerebellum most consistent with chronic infarct, not present in 2020. 3. ASPECTS is 10  11/30 CTA head/ neck/ perfusion >> 1. No emergent large vessel occlusion or infarct/ischemia by perfusion. 2. Lobar and segmental pulmonary emboli incidentally noted in the upper lungs. 3. Minimal flow in the hypoplastic right vertebral artery. There is moderate to advanced atheromatous narrowing of the left V4 segment. 4. Carotid atherosclerosis without flow limiting stenosis or ulceration. 5. Suboptimal bolus.  11/30 CT head>>no acute findings 11/30 CTA head/neck>>no LVO 11/30 MRI brain>>Small acute infarcts involving the left frontal precentral gyrus and the juxtacortical left frontal white matter. Mild associated edema without mass effect 11/30 CT chest>>Bilateral multifocal pulmonary emboli the majority of which appear acute in nature. Some subacute to chronic thrombus is noted within the left lower lobe pulmonary artery.  I have personally reviewed and interpreted all radiology studies and my findings are as above.  VENTILATOR SETTINGS:    Cultures 11/21 SARS2/ flu >>neg 11/21 MRSA PCR >>neg 11/22 BCx 2 >> neg 11/23  sputum >> normal flora  Antimicrobials: Anti-infectives (From admission, onward)   Start     Dose/Rate Route Frequency Ordered Stop   06/16/20 1030  gentamicin (GARAMYCIN) 80 mg in sodium chloride 0.9 % 500 mL irrigation        80 mg Irrigation To Cath Lab 06/15/20 2108 06/17/20 1030   06/16/20 1030  ceFAZolin (ANCEF) IVPB 2g/100 mL premix        2 g 200 mL/hr over 30 Minutes Intravenous To Cath Lab 06/15/20 2108 06/17/20 1030   06/09/20 1045  ceFEPIme (MAXIPIME) 2 g in sodium chloride 0.9 % 100 mL IVPB        2 g 200 mL/hr over 30 Minutes Intravenous Every 8 hours 06/09/20 0948 06/13/20 0329   06/06/20 0800  vancomycin (VANCOREADY) IVPB 750 mg/150 mL  Status:  Discontinued        750 mg 150 mL/hr over 60 Minutes Intravenous Every 12 hours 06/06/20 0713 06/07/20 1201   06/06/20 0800  ceFEPIme (MAXIPIME) 2 g in sodium chloride 0.9 % 100 mL IVPB  Status:  Discontinued        2 g 200 mL/hr over 30 Minutes Intravenous Every 12 hours 06/06/20 0713 06/09/20 0948       Devices    LINES / TUBES:  11/21 ETT >> 11/23 11/21 R IJ cordis  with PA catheter >> 11/21 R femoral TVP >> 11/21 R radial aline >>     Continuous Infusions: . sodium chloride 50 mL/hr at 06/16/20 0700  . sodium chloride    .  ceFAZolin (ANCEF) IV    . feeding supplement (OSMOLITE 1.5 CAL) Stopped (06/16/20 0107)  . heparin 1,200 Units/hr (06/16/20 1135)     Objective: Vitals:   06/16/20 0802 06/16/20 1217 06/16/20 1221 06/16/20 1222  BP: (!) 152/112 (!) 166/116 (!) 165/95 (!) 165/95  Pulse:    73  Resp: (!) 22 (!) 23 (!) 21 (!) 25  Temp:  97.7 F (36.5 C)    TempSrc:  Oral    SpO2:    93%  Weight:      Height:        Intake/Output Summary (Last 24 hours) at 06/16/2020 1329 Last data filed at 06/16/2020 0700 Gross per 24 hour  Intake 907.8 ml  Output 275 ml  Net 632.8 ml   Filed Weights   06/14/20 0500 06/15/20 0427 06/16/20 0500  Weight: 88.2 kg 88.8 kg 87.7 kg    Examination:  General:  A/O x2 (does not know where, when).  With prompting does know that he is in the hospital, no acute respiratory distress Eyes: negative scleral hemorrhage, negative anisocoria, negative icterus ENT: Negative Runny nose, negative gingival bleeding, Neck:  Negative scars, masses, torticollis, lymphadenopathy, JVD Lungs: Clear to auscultation bilaterally without wheezes or crackles Cardiovascular: Regular rate and rhythm without murmur gallop or rub normal S1 and S2 Abdomen: negative abdominal pain, nondistended, positive soft, bowel sounds, no rebound, no ascites, no appreciable mass Extremities: No significant cyanosis, clubbing, or edema bilateral lower extremities Skin: Negative rashes, lesions, ulcers Psychiatric:  Negative depression, negative anxiety, negative fatigue, negative mania  Central nervous system:  Cranial nerves II through XII intact, tongue/uvula midline, all extremities muscle strength 5/5, sensation intact throughout, negative dysarthria, negative expressive aphasia, negative receptive aphasia.  .     Data Reviewed: Care during the described time interval was provided by me .  I have reviewed this patient's available data, including medical history, events of note, physical examination, and all test results as part of my evaluation.  CBC: Recent Labs  Lab 06/10/20 0518 06/11/20 0331 06/12/20 0409 06/13/20 0500 06/14/20 0548  WBC 11.0* 12.3* 14.3* 14.5* 14.2*  HGB 12.9* 13.7 13.8 14.8 14.4  HCT 40.0 43.3 42.3 44.7 41.5  MCV 94.6 94.5 93.6 92.0 88.7  PLT 179 222 264 313 102   Basic Metabolic Panel: Recent Labs  Lab 06/12/20 0409 06/12/20 0409 06/12/20 1709 06/12/20 1709 06/13/20 0500 06/13/20 1947 06/14/20 0548 06/15/20 0414 06/16/20 0125 06/16/20 0719  NA 139  --   --   --  141  --  139 141  --  139  K 3.5  --   --   --  3.5  --  3.3* 3.6  --  4.5  CL 107  --   --   --  104  --  104 105  --  105  CO2 24  --   --   --  24  --  23 23  --  23  GLUCOSE 94   --   --   --  120*  --  127* 134*  --  108*  BUN 19  --   --   --  22  --  19 20  --  20  CREATININE 1.21  --   --   --  1.13  --  0.99 1.05  --  1.11  CALCIUM 8.0*  --   --   --  8.5*  --  8.3* 8.5*  --  8.9  MG 2.1   < > 1.9   < > 2.0 1.8 1.9 2.1 2.0  --   PHOS  --   --  1.5*  --  1.9* 2.0* 2.1*  --   --   --    < > = values in this interval not displayed.   GFR: Estimated Creatinine Clearance: 69.2 mL/min (by C-G formula based on SCr of 1.11 mg/dL). Liver Function Tests: Recent Labs  Lab 06/16/20 0719  AST 67*  ALT 82*  ALKPHOS 66  BILITOT 1.3*  PROT 7.3  ALBUMIN 3.1*   No results for input(s): LIPASE, AMYLASE in the last 168 hours. Recent Labs  Lab 06/13/20 1124 06/16/20 0125  AMMONIA 46* 57*   Coagulation Profile: No results for input(s): INR, PROTIME in the last 168 hours. Cardiac Enzymes: No results for input(s): CKTOTAL, CKMB, CKMBINDEX, TROPONINI in the last 168 hours. BNP (last 3 results) No results for input(s): PROBNP in the last 8760 hours. HbA1C: Recent Labs    06/14/20 0548  HGBA1C 5.8*   CBG: Recent Labs  Lab 06/15/20 2049 06/15/20 2352 06/16/20 0346 06/16/20 0851 06/16/20 1216  GLUCAP 115* 142* 103* 111* 91   Lipid Profile: Recent Labs    06/14/20 0548  CHOL 131  HDL 25*  LDLCALC 73  TRIG 165*  CHOLHDL 5.2   Thyroid Function Tests: No results for input(s): TSH, T4TOTAL, FREET4, T3FREE, THYROIDAB in the last 72 hours. Anemia Panel: No results for input(s): VITAMINB12, FOLATE, FERRITIN, TIBC, IRON, RETICCTPCT in the last 72 hours. Sepsis Labs: No results for input(s): PROCALCITON, LATICACIDVEN in the last 168 hours.  Recent Results (from the past 240 hour(s))  Surgical PCR screen     Status: None   Collection Time: 06/15/20 10:50 PM   Specimen: Nasal Mucosa; Nasal Swab  Result Value Ref Range Status   MRSA, PCR NEGATIVE NEGATIVE Final   Staphylococcus aureus NEGATIVE NEGATIVE Final    Comment: (NOTE) The Xpert SA Assay (FDA  approved for NASAL specimens in patients 38 years of age and older), is one component of a comprehensive surveillance program. It is not intended to diagnose infection nor to guide or monitor treatment. Performed at [facility], [place] [address], [place], [place] [address]          Radiology Studies: VAS [place] LOWER EXTREMITY VENOUS (DVT)  Result Date: 06/14/2020  Lower Venous DVT Study Indications: Pulmonary embolism.  Risk Factors: Confirmed PE. Anticoagulation: Heparin. Limitations: Bandages. Comparison Study: No prior studies. Performing Technologist: Oliver Hum RVT  Examination Guidelines: A complete evaluation includes B-mode imaging, spectral Doppler, color Doppler, and power Doppler as needed of all accessible portions of each vessel. Bilateral testing is considered an integral part of a complete examination. Limited examinations for reoccurring indications may be performed as noted. The reflux portion of the exam is performed with the patient in reverse Trendelenburg.  +---------+---------------+---------+-----------+----------+--------------+ RIGHT    CompressibilityPhasicitySpontaneityPropertiesThrombus Aging +---------+---------------+---------+-----------+----------+--------------+ CFV      Full           Yes      Yes                                 +---------+---------------+---------+-----------+----------+--------------+ SFJ      Full                                                        +---------+---------------+---------+-----------+----------+--------------+  FV Prox  Full                                                        +---------+---------------+---------+-----------+----------+--------------+ FV Mid   Full                                                        +---------+---------------+---------+-----------+----------+--------------+ FV DistalFull                                                         +---------+---------------+---------+-----------+----------+--------------+ PFV      Full                                                        +---------+---------------+---------+-----------+----------+--------------+ POP      Full           Yes      Yes                                 +---------+---------------+---------+-----------+----------+--------------+ PTV      Partial                                      Acute          +---------+---------------+---------+-----------+----------+--------------+ PERO     None                                         Acute          +---------+---------------+---------+-----------+----------+--------------+   +---------+---------------+---------+-----------+----------+--------------+ LEFT     CompressibilityPhasicitySpontaneityPropertiesThrombus Aging +---------+---------------+---------+-----------+----------+--------------+ CFV      Full           Yes      Yes                                 +---------+---------------+---------+-----------+----------+--------------+ SFJ      Full                                                        +---------+---------------+---------+-----------+----------+--------------+ FV Prox  Full                                                        +---------+---------------+---------+-----------+----------+--------------+  FV Mid   Full                                                        +---------+---------------+---------+-----------+----------+--------------+ FV DistalFull                                                        +---------+---------------+---------+-----------+----------+--------------+ PFV      Full                                                        +---------+---------------+---------+-----------+----------+--------------+ POP      Full           Yes      Yes                                  +---------+---------------+---------+-----------+----------+--------------+ PTV      Partial                                      Acute          +---------+---------------+---------+-----------+----------+--------------+ PERO     Full                                                        +---------+---------------+---------+-----------+----------+--------------+ Gastroc  None                                         Acute          +---------+---------------+---------+-----------+----------+--------------+     Summary: RIGHT: - Findings consistent with acute deep vein thrombosis involving the right posterior tibial veins, and right peroneal veins. - No cystic structure found in the popliteal fossa.  LEFT: - Findings consistent with acute deep vein thrombosis involving the left gastrocnemius veins, and left posterior tibial veins. - No cystic structure found in the popliteal fossa.  *See table(s) above for measurements and observations. Electronically signed by Harold Barban MD on 06/14/2020 at 8:52:42 PM.    Final         Scheduled Meds: .  stroke: mapping our early stages of recovery book   Does not apply Once  . amiodarone  200 mg Per Tube BID  . aspirin  81 mg Per Tube Daily  . atorvastatin  80 mg Per Tube q1800  . chlorhexidine  15 mL Mouth Rinse BID  . Chlorhexidine Gluconate Cloth  6 each Topical Daily  . clopidogrel  75 mg Per Tube Daily  . digoxin  0.125 mg Per Tube Daily  . escitalopram  10 mg Per Tube Daily  . ezetimibe  10  mg Per Tube Daily  . feeding supplement (PROSource TF)  45 mL Per Tube QID  . gentamicin irrigation  80 mg Irrigation To Cath  . lactulose  20 g Per Tube TID  . mouth rinse  15 mL Mouth Rinse q12n4p  . sacubitril-valsartan  1 tablet Oral BID  . sodium chloride flush  3 mL Intravenous Q12H  . spironolactone  25 mg Per Tube Daily   Continuous Infusions: . sodium chloride 50 mL/hr at 06/16/20 0700  . sodium chloride    .  ceFAZolin (ANCEF) IV     . feeding supplement (OSMOLITE 1.5 CAL) Stopped (06/16/20 0107)  . heparin 1,200 Units/hr (06/16/20 1135)     LOS: 12 days    Time spent:40 min    Kolbee Bogusz, Geraldo Docker, MD [facility] Pager [phone]  If 7PM-7AM, please contact night-coverage [email or web address] Password [email or web address] 06/16/2020, 1:29 PM

## 2020-06-16 NOTE — Progress Notes (Signed)
Progress Note  Patient Name: [name] Date of Encounter: 06/16/2020  [facility] Cardiologist: Sanda Klein, MD   Subjective   No CP, palpitations or SOB  Inpatient Medications    Scheduled Meds: .  stroke: mapping our early stages of recovery book   Does not apply Once  . amiodarone  200 mg Per Tube BID  . aspirin  81 mg Per Tube Daily  . atorvastatin  80 mg Per Tube q1800  . chlorhexidine  15 mL Mouth Rinse BID  . Chlorhexidine Gluconate Cloth  6 each Topical Daily  . clopidogrel  75 mg Per Tube Daily  . digoxin  0.125 mg Per Tube Daily  . escitalopram  10 mg Per Tube Daily  . ezetimibe  10 mg Per Tube Daily  . feeding supplement (PROSource TF)  45 mL Per Tube QID  . gentamicin irrigation  80 mg Irrigation To Cath  . lactulose  20 g Per Tube TID  . mouth rinse  15 mL Mouth Rinse q12n4p  . sacubitril-valsartan  1 tablet Oral BID  . sodium chloride flush  3 mL Intravenous Q12H  . spironolactone  25 mg Per Tube Daily   Continuous Infusions: . sodium chloride 50 mL/hr at 06/16/20 0700  . sodium chloride    .  ceFAZolin (ANCEF) IV    . feeding supplement (OSMOLITE 1.5 CAL) Stopped (06/16/20 0107)   PRN Meds: acetaminophen (TYLENOL) oral liquid 160 mg/5 mL, albuterol, guaiFENesin, loperamide HCl, nitroGLYCERIN, ondansetron (ZOFRAN) IV, sodium chloride flush   Vital Signs    Vitals:   06/15/20 2351 06/16/20 0335 06/16/20 0500 06/16/20 0800  BP: (!) 178/104 (!) 153/103  (!) 160/92  Pulse: 96 78  74  Resp: 20 (!) 21  17  Temp: 98.6 F (37 C) 97.7 F (36.5 C)  97.9 F (36.6 C)  TempSrc: Oral Oral  Oral  SpO2: 96% 93%  95%  Weight:   87.7 kg   Height:        Intake/Output Summary (Last 24 hours) at 06/16/2020 0803 Last data filed at 06/16/2020 0700 Gross per 24 hour  Intake 907.8 ml  Output 425 ml  Net 482.8 ml   Last 3 Weights 06/16/2020 06/15/2020 06/14/2020  Weight (lbs) 193 lb 5.5 oz 195 lb 12.3 oz 194 lb 7.1 oz  Weight (kg) 87.7 kg 88.8 kg 88.2  kg      Telemetry    SR, generally 70's, occ PVCs, less NSVT episodes, seems to have settled - Personally Reviewed  ECG    No new EKGs - Personally Reviewed  Physical Exam   GEN: somewhat chronically ill appearing, but generally appears better each day Neck: No JVD Cardiac: RRR, no murmurs, rubs, or gallops Respiratory: CTA b/l GI: Soft, nontender  MS: trace edema; No deformity. Neuro:  clear this morning   Psych: very pleasant  Labs    High Sensitivity Troponin:   Recent Labs  Lab 06/04/20 1148  TROPONINIHS 157*      Chemistry Recent Labs  Lab 06/13/20 0500 06/14/20 0548 06/15/20 0414  NA 141 139 141  K 3.5 3.3* 3.6  CL 104 104 105  CO2 24 23 23   GLUCOSE 120* 127* 134*  BUN 22 19 20   CREATININE 1.13 0.99 1.05  CALCIUM 8.5* 8.3* 8.5*  GFRNONAA >60 >60 >60  ANIONGAP 13 12 13      Hematology Recent Labs  Lab 06/12/20 0409 06/13/20 0500 06/14/20 0548  WBC 14.3* 14.5* 14.2*  RBC 4.52 4.86 4.68  HGB 13.8 14.8 14.4  HCT 42.3 44.7 41.5  MCV 93.6 92.0 88.7  MCH 30.5 30.5 30.8  MCHC 32.6 33.1 34.7  RDW 12.5 12.6 12.7  PLT 264 313 363    BNPNo results for input(s): BNP, PROBNP in the last 168 hours.   DDimer No results for input(s): DDIMER in the last 168 hours.   Radiology    06/07/20: CXR  IMPRESSION: 1. Stable line positioning with stable left basilar atelectasis and/or infiltrate. 2. Very small, stable left pleural effusion.   Cardiac Studies   06/05/2020: TTE IMPRESSIONS  1. Technically difficult study with poor echo windows. Definity contrast  given.  2. Left ventricular ejection fraction, by estimation, is 25 to 30%. The  left ventricle has severely decreased function. The left ventricle  demonstrates global hypokinesis with regional variation including apical  dyskineisis. There is moderate left  ventricular hypertrophy. Left ventricular diastolic parameters are  indeterminate. Linear filling defect at the apex, could be thrombus  or  false tendon.  3. Right ventricular systolic function was not well visualized. The right  ventricular size is not well visualized.  4. The mitral valve was not well visualized. No evidence of mitral valve  regurgitation.  5. The aortic valve is tricuspid. Aortic valve regurgitation is trivial.  Mild aortic valve sclerosis is present, with no evidence of aortic valve  stenosis.  6. Aortic dilatation noted. There is borderline dilatation of the aortic  root, measuring 39 mm. There is mild dilatation of the ascending aorta,  measuring 40 mm.   Comparison(s): No significant change from prior study. 05/04/2020: LVEF  25-30%, 14 x 6 mm apical filing defect suspicious for LV thrombus. No  change in apical filling defect. Suspect false tendon, however, cannot  exclude thrombus given cardiomyopathy.    06/04/2020: R/LHC  Mid LAD lesion is 100% stenosed.  Prox Cx lesion is 100% stenosed.  Prox RCA lesion is 100% stenosed.  Dist RCA lesion is 20% stenosed.  Non-stenotic Origin to Prox Graft lesion was previously treated.  Prox Graft lesion is 30% stenosed.  LV end diastolic pressure is mildly elevated.   1. Severe 3 vessel occlusive CAD. 2. Patent LIMA to the LAD 3. Patent SVG to OM1 4. Patent SVG to RCA. Much improved flow from prior PCI. 5. Mildly elevated LVEDP 17 mm Hg 6. Successful placement of temporary transvenous pacemaker.  Plan: Dr Haroldine Laws placed a [name] catheter via the right internal jugular Vein. Will leave temporary transvenous pacemaker in place so that antiarrhythmic drugs can be used liberally.  Right heart cath demonstrates good cardiac output and right heart pressures so we deferred placing an IABP. Will need right femoral arterial sheath removed manually.  RHC Findings:  Ao = 174/111 (138)  LV = 169/16 RA =  7 RV = 41/6 PA = 37/20 (28) PCW = 14 Fick cardiac output/index = 11.6/5.7 PVR = 1.2 WU FA sat = 99% PA sat = 85%, 86% SVC sat =  89%  High cardiac output with normal filling pressures. No evidence of intracardiac shunting.     05/04/2020: TTE IMPRESSIONS  1. Since the last study on 06/23/2019 LVEF has decreased from 35-40% to  25-30%, there is a suspicion for left ventricular apical thrombus  measuring 14 x 6 mm.  2. Left ventricular ejection fraction, by estimation, is 25 to 30%. The  left ventricle has severely decreased function. The left ventricle  demonstrates global hypokinesis. The left ventricular internal cavity size  was moderately dilated.  Left  ventricular diastolic parameters are consistent with Grade I diastolic  dysfunction (impaired relaxation). Elevated left atrial pressure.  3. Right ventricular systolic function is moderately reduced. The right  ventricular size is moderately enlarged. There is mildly elevated  pulmonary artery systolic pressure. The estimated right ventricular  systolic pressure is 37.1 mmHg.  4. Left atrial size was moderately dilated.  5. Right atrial size was moderately dilated.  6. The mitral valve is normal in structure. Mild mitral valve  regurgitation. No evidence of mitral stenosis.  7. The aortic valve is normal in structure. Aortic valve regurgitation is  mild. No aortic stenosis is present.  8. The inferior vena cava is normal in size with greater than 50%  respiratory variability, suggesting right atrial pressure of 3 mmHg.    Patient Profile     64 y.o. male with aPMH of CAD (s/p CABG in 2014 with LIMA-LAD, SVG-OM2 and SVG-RCA, s/p angioplasty to proximal SVG-RCA and DES to SVG-RCA anastomosisin 03/2019), chronic combined systolic and diastolic CHF/ICM (EF 06-26% by echo in 06/2019), HTN, HLD,prior CVAand recent STEMIwith angioplasty of the distal bypass graft10/21/21  EMS [place] syncope at [place] found by EMS in [place]/VT treated with amio bolus' repeat bolus in ER >> asystole >> external pacing>> emergent cath/temp wire placement,  intubated sedated.  Assessment & Plan   Dr. Quentin Ore has seen the patient this AM   . VT arrest -> asystole - emergent cath 06/04/20 with stable coronary anatomy - suspect scar mediated VT -  amio has been transitioned to PO     Had some lido in the ER only - Keep K > 4.0 Mg > 2.0 - no bradycardia, temp wire is out (11/27)  NSVT, continue amio Plan for mexiletine and BB post implant as well  ICD implant planned for today Patient recalled our discussion yesterday, revisited with him today by Dr. Quentin Ore, he remains agreeable to proceed Called wife yesterday 2x, unable to reach her, had previously discussed with her plans for implant  Today and agreeable    2. Chronic systolic HF with biventricular failure -> cardiogenic shock  - Echo 05/04/20 EF 25%. Moderate RV to severe RV dysfunction and possible LV thrombus. LifeVest placed. D/c'd home on 05/06/20 - RHC on admit with preserved output and well compensated filling pressures.  Off NE and milrinone On dig, entresto, aldactone  Given asystolic event in ED, hold off betablocker until device is in Dig level ok  3. CAD  - s/p CABG  - Inferior STEMI 05/03/20 s/p POBA SVG->PDA (felt 2/2 missed brilinta doses) - Emergent cath 06/04/20 with stable coronary anatomy - on DAPT/statin/zetia Will not hold DAPT for implant given his hx nd recent angioplasty    4. Possible LV thrombus on echo 05/04/20 TEE  This admission with no thrombus  5. HTN     Plan to add coreg post implant    Appreciate IM taking on attending role going forward  6. Acute hypoxic respiratory failure - intubated in setting of cardiac arrest Extubated 11/23 Intubated 11/23 Extubated 11/26 Remains on RA  7. fever      Remains resolved      BC x2 are neg for 5 days      Pneumonia (HCAP) completed treatment       WBC >> last  Few remained at 14      C/w IM   8. AKI/CKD     Renal function remains stable  9. Delirium/stroke  Patient mentation  and clarity improves daily     Recalls plans for ICD today, asks appropriate questions     Appreciate neurology help > s/o   elevated ammonia noted, getting lactulose with + response reported by RN  Does not appear lethargic this AM CMP ordered, will defer to attending/medicine service     10. Pulm emb     resp status remains stable on RA     Re-started on heparin gtt (stroke protocol)     LE venous dopplers+ b/l DVTs  Heparin gtt held for implant today Plans for [facility] a couple days post procedure    11. Dysphagia       Cleared for diet 06/14/20 by speech and had diet ordered yesterday       Looks like getting night tube feeds for additional nutrition       NPO this AM for procedure > resume diet afterwards           For questions or updates, please contact [facility] Please consult [email or web address] for contact info under        Signed, Baldwin [place], PA-C  06/16/2020, 8:03 AM

## 2020-06-16 NOTE — Progress Notes (Signed)
Occupational Therapy Treatment Patient Details Name: [name] MRN: [id number] DOB: [birth year] Today's Date: 06/16/2020    History of present illness 64 yo with a hx as stated above who presented to [facility]20 after having a pre-syncopal symptoms including dizzinezss while at church earlier today found to be in VT (not wearing Life Vest as prescribed on recent hospital discharge) on EMS arrival. Has not been compliant with LifeVest stating the battery pack too bulky. PMH of CAD (s/p CABG in 2014 with LIMA-LAD, SVG-OM2 and SVG-RCA, s/p angioplasty to proximal SVG-RCA and DES to SVG-RCA anastomosis in 03/2019), chronic combined systolic and diastolic CHF/ICM (EF 62-95% by echo in 06/2019), HTN, HLD, prior CVA and recent STEMI with angioplasty of the distal bypass graft 05/04/20. Admitted 06/04/20 for treatment of wide complex tachycardia. Intubated 11/21-Extubated 11/23 and reintubated extubated 11/26 R radial aline 11/23 TEE 11/24: EF 30-35% no evidence of LV clot.  11/30 pt noted to have increased R sided weakness by OT, code stroke called MRI revealed Small acute infarcts involving the left frontal precentral gyrus   OT comments  Patient demonstrates improved mobility and cognition this date.  He still presents with the deficts listed below.  He was able to walk in the room without an assistive device with [employer], stand grooming with supervision, UB ADL with supervision and Min A with lower body ADL.  Coordination and safety are improving.  Patient did complain of shortness of breath: O2 sats did drop to 88% on RA.  Patient able to rest, and verbal cues for pursed lip breathing, he rebounded to 97%.  Continued education needed for energy compensation.  He is being considered for CIR.  OT will recommend CIR given his level of independence prior level.  OT will also follow in the acute setting.    Follow Up Recommendations  CIR;Supervision/Assistance - 24 hour    Equipment Recommendations  3  in 1 bedside commode    Recommendations for Other Services PT consult    Precautions / Restrictions Precautions Precautions: Fall Precaution Comments: [place] Restrictions Weight Bearing Restrictions: No       Mobility Bed Mobility Overal bed mobility: Needs Assistance Bed Mobility: Supine to Sit     Supine to sit: HOB elevated;Min assist     General bed mobility comments: +rail, increased time  Transfers Overall transfer level: Needs assistance Equipment used: Rolling walker (2 wheeled) Transfers: Sit to/from Stand Sit to Stand: Supervision Stand pivot transfers: Min guard       General transfer comment: received in recliner.  Able to walk to sink for ADL    Balance Overall balance assessment: Needs assistance Sitting-balance support: Feet supported;No upper extremity supported Sitting balance-Leahy Scale: Fair     Standing balance support: Bilateral upper extremity supported Standing balance-Leahy Scale: Fair Standing balance comment: reliant on external support.  Able to have patient walking in room without 2WRW                           ADL either performed or assessed with clinical judgement   ADL   Eating/Feeding: Independent;Sitting Eating/Feeding Details (indicate cue type and reason): Patient able to eat lunch this date: regular diet with thin liquids. Grooming: Wash/dry face;Standing;Supervision/safety   Upper Body Bathing: Supervision/ safety;Sitting   Lower Body Bathing: Minimal assistance;Sit to/from stand       Lower Body Dressing: Minimal assistance;Sit to/from stand   Toilet Transfer: Min guard;RW;Ambulation  Functional mobility during ADLs: Rolling walker;Min guard General ADL Comments: Increased shortness of breath noted.     Vision       Perception     Praxis      Cognition Arousal/Alertness: Awake/alert Behavior During Therapy: WFL for tasks assessed/performed Overall Cognitive Status:  Impaired/Different from baseline Area of Impairment: Orientation;Memory;Following commands;Safety/judgement;Awareness;Problem solving                 Orientation Level: Person;Place;Situation   Memory: Decreased short-term memory Following Commands: Follows one step commands with increased time;Follows multi-step commands inconsistently Safety/Judgement: Decreased awareness of safety;Decreased awareness of deficits Awareness: Emergent Problem Solving: Slow processing;Requires verbal cues General Comments: multimodal cues for safety        Exercises     Shoulder Instructions       General Comments HR to 101 with mobility    Pertinent Vitals/ Pain       Pain Assessment: No/denies pain  Home Living                                          Prior Functioning/Environment              Frequency  Min 3X/week        Progress Toward Goals  OT Goals(current goals can now be found in the care plan section)  Progress towards OT goals: Progressing toward goals  Acute Rehab OT Goals Patient Stated Goal: I would like to get the tube out. OT Goal Formulation: With patient Time For Goal Achievement: 06/27/20 Potential to Achieve Goals: Good  Plan Discharge plan remains appropriate    Co-evaluation                 AM-PAC OT "6 Clicks" Daily Activity     Outcome Measure   Help from another person eating meals?: None Help from another person taking care of personal grooming?: A Little Help from another person toileting, which includes using toliet, bedpan, or urinal?: A Little Help from another person bathing (including washing, rinsing, drying)?: A Little Help from another person to put on and taking off regular upper body clothing?: A Little Help from another person to put on and taking off regular lower body clothing?: A Lot 6 Click Score: 18    End of Session Equipment Utilized During Treatment: Rolling walker;Gait belt  OT Visit  Diagnosis: Unsteadiness on feet (R26.81);Other abnormalities of gait and mobility (R26.89);Muscle weakness (generalized) (M62.81)   Activity Tolerance Patient tolerated treatment well   Patient Left in chair;with call bell/phone within reach;with chair alarm set;with family/visitor present   Nurse Communication          Time: 4174-0814 OT Time Calculation (min): 19 min  Charges: OT General Charges $OT Visit: 1 Visit OT Treatments $Self Care/Home Management : 8-22 mins  06/16/2020  Rich, OTR/L  Acute Rehabilitation Services  Office:  [phone]    Metta Clines 06/16/2020, 2:50 PM

## 2020-06-17 DIAGNOSIS — I639 Cerebral infarction, unspecified: Secondary | ICD-10-CM

## 2020-06-17 DIAGNOSIS — I472 Ventricular tachycardia: Secondary | ICD-10-CM | POA: Diagnosis not present

## 2020-06-17 DIAGNOSIS — I2699 Other pulmonary embolism without acute cor pulmonale: Secondary | ICD-10-CM | POA: Diagnosis not present

## 2020-06-17 LAB — COMPREHENSIVE METABOLIC PANEL
ALT: 84 U/L — ABNORMAL HIGH (ref 0–44)
AST: 64 U/L — ABNORMAL HIGH (ref 15–41)
Albumin: 3 g/dL — ABNORMAL LOW (ref 3.5–5.0)
Alkaline Phosphatase: 66 U/L (ref 38–126)
Anion gap: 11 (ref 5–15)
BUN: 17 mg/dL (ref 8–23)
CO2: 22 mmol/L (ref 22–32)
Calcium: 8.8 mg/dL — ABNORMAL LOW (ref 8.9–10.3)
Chloride: 104 mmol/L (ref 98–111)
Creatinine, Ser: 1.11 mg/dL (ref 0.61–1.24)
GFR, Estimated: >60 mL/min (ref >60)
Glucose, Bld: 114 mg/dL — ABNORMAL HIGH (ref 70–99)
Potassium: 4.1 mmol/L (ref 3.5–5.1)
Sodium: 137 mmol/L (ref 135–145)
Total Bilirubin: 1.5 mg/dL — ABNORMAL HIGH (ref 0.3–1.2)
Total Protein: 6.9 g/dL (ref 6.5–8.1)

## 2020-06-17 LAB — CBC WITH DIFFERENTIAL/PLATELET
Abs Immature Granulocytes: 0.13 10*3/uL — ABNORMAL HIGH (ref 0.00–0.07)
Basophils Absolute: 0.1 10*3/uL (ref 0.0–0.1)
Basophils Relative: 1 %
Eosinophils Absolute: 0.5 10*3/uL (ref 0.0–0.5)
Eosinophils Relative: 3 %
HCT: 43.9 % (ref 39.0–52.0)
Hemoglobin: 14.5 g/dL (ref 13.0–17.0)
Immature Granulocytes: 1 %
Lymphocytes Relative: 21 %
Lymphs Abs: 3.4 10*3/uL (ref 0.7–4.0)
MCH: 30 pg (ref 26.0–34.0)
MCHC: 33 g/dL (ref 30.0–36.0)
MCV: 90.7 fL (ref 80.0–100.0)
Monocytes Absolute: 1.1 10*3/uL — ABNORMAL HIGH (ref 0.1–1.0)
Monocytes Relative: 7 %
Neutro Abs: 11.3 10*3/uL — ABNORMAL HIGH (ref 1.7–7.7)
Neutrophils Relative %: 67 %
Platelets: 475 10*3/uL — ABNORMAL HIGH (ref 150–400)
RBC: 4.84 MIL/uL (ref 4.22–5.81)
RDW: 13.2 % (ref 11.5–15.5)
WBC: 16.5 10*3/uL — ABNORMAL HIGH (ref 4.0–10.5)
nRBC: 0 % (ref 0.0–0.2)

## 2020-06-17 LAB — AMMONIA: Ammonia: 40 umol/L — ABNORMAL HIGH (ref 9–35)

## 2020-06-17 LAB — GLUCOSE, CAPILLARY
Glucose-Capillary: 103 mg/dL — ABNORMAL HIGH (ref 70–99)
Glucose-Capillary: 107 mg/dL — ABNORMAL HIGH (ref 70–99)
Glucose-Capillary: 117 mg/dL — ABNORMAL HIGH (ref 70–99)
Glucose-Capillary: 125 mg/dL — ABNORMAL HIGH (ref 70–99)
Glucose-Capillary: 146 mg/dL — ABNORMAL HIGH (ref 70–99)
Glucose-Capillary: 147 mg/dL — ABNORMAL HIGH (ref 70–99)

## 2020-06-17 LAB — MAGNESIUM: Magnesium: 1.9 mg/dL (ref 1.7–2.4)

## 2020-06-17 LAB — HEPARIN LEVEL (UNFRACTIONATED)
Heparin Unfractionated: 0.22 IU/mL — ABNORMAL LOW (ref 0.30–0.70)
Heparin Unfractionated: 0.2 IU/mL — ABNORMAL LOW (ref 0.30–0.70)
Heparin Unfractionated: 0.39 IU/mL (ref 0.30–0.70)

## 2020-06-17 LAB — PHOSPHORUS: Phosphorus: 3.8 mg/dL (ref 2.5–4.6)

## 2020-06-17 MED ORDER — SACUBITRIL-VALSARTAN 97-103 MG PO TABS
1.0000 | ORAL_TABLET | Freq: Two times a day (BID) | ORAL | Status: DC
  Administered 2020-06-17 – 2020-06-22 (×11): 1
  Filled 2020-06-17 (×12): qty 1

## 2020-06-17 MED ORDER — HYDRALAZINE HCL 10 MG PO TABS
10.0000 mg | ORAL_TABLET | Freq: Three times a day (TID) | ORAL | Status: DC
  Administered 2020-06-17 – 2020-06-18 (×3): 10 mg via ORAL
  Filled 2020-06-17 (×3): qty 1

## 2020-06-17 NOTE — Progress Notes (Signed)
ANTICOAGULATION CONSULT NOTE  Pharmacy Consult for Heparin Indication: pulmonary embolus and DVTs  No Known Allergies  Patient Measurements: Height: 5\' 7"  (170.2 cm) Weight: 86.4 kg (190 lb 6.4 oz) IBW/kg (Calculated) : 66.1 Heparin Dosing Weight: 86kg  Vital Signs: Temp: 97.5 F (36.4 C) (12/04 1114) Temp Source: Oral (12/04 1114) BP: 160/91 (12/04 1114) Pulse Rate: 81 (12/04 1114)  Labs: Recent Labs    06/15/20 0414 06/15/20 0414 06/16/20 0719 06/16/20 1649 06/17/20 0058 06/17/20 1032  HGB  --   --   --   --  14.5  --   HCT  --   --   --   --  43.9  --   PLT  --   --   --   --  475*  --   HEPARINUNFRC 0.27*   < >  --  <0.10* 0.22* 0.20*  CREATININE 1.05  --  1.11  --  1.11  --    < > = values in this interval not displayed.    Estimated Creatinine Clearance: 68.7 mL/min (by C-G formula based on SCr of 1.11 mg/dL).   Assessment: 64 yo M admitted with VT and concern for ACS. Cath clean and heparin resumed for presumed LV thrombus. Repeat TEE 11/24 showed no thrombus so heparin discontinued. Patient noted to have acute CVA 11/30 am, CT angio demonstrated incidental finding of PE.  Dopplers also + bilateral DVTs.  Neurology cleared for stroke protocol heparin, CCM consulted pharmacy to begin heparin infusion. Will target lower goal range.  Heparin level remains below goal on 1400 units/hr.  No bleeding noted.   Goal of Therapy:  Heparin level 0.3-0.5 units/ml Monitor platelets by anticoagulation protocol: Yes   Plan:  -Increase Heparin to 1550 units/hr -Re-check 6hr heparin level -Daily heparin level and CBC while on therapy  [employer], Pharm.D., BCPS Clinical Pharmacist  **Pharmacist phone directory can be found on [email or web address] listed under [place].  06/17/2020 12:31 PM

## 2020-06-17 NOTE — Progress Notes (Signed)
Inpatient Rehab Admissions:  Inpatient Rehab Consult received.  I met with patient at the bedside for rehabilitation assessment and to discuss goals and expectations of an inpatient rehab admission.  Pt acknowledged understanding.  Pt interested in pursuing CIR.  Pt appears to be an appropriate candidate for potential admission. Pt gave permission to talk with wife, [name]. Awaiting return call. Will continue to follow.  Signed: Gayland Curry, [place], [place] Admissions Coordinator [phone]

## 2020-06-17 NOTE — Progress Notes (Signed)
ANTICOAGULATION CONSULT NOTE  Pharmacy Consult for Heparin Indication: pulmonary embolus and DVTs  No Known Allergies  Patient Measurements: Height: 5\' 7"  (170.2 cm) Weight: 86.4 kg (190 lb 6.4 oz) IBW/kg (Calculated) : 66.1 Heparin Dosing Weight: 86kg  Vital Signs: Temp: 98.7 F (37.1 C) (12/04 1952) Temp Source: Oral (12/04 1952) BP: 158/98 (12/04 1952) Pulse Rate: 72 (12/04 1952)  Labs: Recent Labs    06/15/20 0414 06/16/20 0719 06/16/20 1649 06/17/20 0058 06/17/20 1032 06/17/20 1942  HGB  --   --   --  14.5  --   --   HCT  --   --   --  43.9  --   --   PLT  --   --   --  475*  --   --   HEPARINUNFRC 0.27*  --    < > 0.22* 0.20* 0.39  CREATININE 1.05 1.11  --  1.11  --   --    < > = values in this interval not displayed.    Estimated Creatinine Clearance: 68.7 mL/min (by C-G formula based on SCr of 1.11 mg/dL).   Assessment: 64 yo M admitted with VT and concern for ACS. Cath clean and heparin resumed for presumed LV thrombus. Repeat TEE 11/24 showed no thrombus so heparin discontinued. Patient noted to have acute CVA 11/30 am, CT angio demonstrated incidental finding of PE.  Dopplers also + bilateral DVTs.  Neurology cleared for stroke protocol heparin, CCM consulted pharmacy to begin heparin infusion. Will target lower goal range.  Heparin level this evening is therapeutic (HL 0.39, goal of 0.3-0.5).  No bleeding or issues noted per RN.  Goal of Therapy:  Heparin level 0.3-0.5 units/ml Monitor platelets by anticoagulation protocol: Yes   Plan:  - Continue Heparin at 1550 units/hr - Will continue to monitor for any signs/symptoms of bleeding and will follow up with heparin level in 6 hours to confirm therapeutic  Thank you for allowing pharmacy to be a part of this patient's care.  Alycia Rossetti, PharmD, BCPS Clinical Pharmacist Clinical phone for 06/17/2020: [phone] 06/17/2020 8:20 PM   **Pharmacist phone directory can now be found on [email or web address] ([email or web address]).  Listed under [place].

## 2020-06-17 NOTE — Progress Notes (Signed)
PROGRESS NOTE    [name]  OMV:[id number] DOB: 04/18/1956 DOA: 06/04/2020 PCP: [name], MD     Brief Narrative:  64 yo WM PMHx CAD w/ previous CABG in 2014, chronic combined systolic and diastolic HF/ ICM, HTN, HLD, CVA, and recent inferior STEMI 05/03/20 secondary to thrombotic occlusion of the SVG to the RCA, likely due to missed ticagrelor doses.  EF noted to be 25% with moderate RV to severe RV dysfunction and possible LV thrombus 05/04/2020.  He was discharged home on 10/23 on a LifeVest, but apparently not using.    On 11/21, he developed dizziness at church.  EMS found patient in wide complex tachycardia treated with amiodarone and lidocaine with successful conversion to NSR.  On arrival to ER, patient had recurrence of his VT but hemodynamically tolerating.  He was given additional amiodarone and lidocaine gtt started, however developed severe bradycardia requiring atropine and external pacing.  He was intubated and taken emergently for temporary transvenous pacemaker and coronary angiogram.  Returns to ICU sedated and intubated, PCCM consulted for further ventilator management.   Subjective: 12/4 afebrile overnight A/O x4, lying in bed comfortably, negative CP, negative abdominal pain, negative S OB   Assessment & Plan: Covid vaccination; unvaccinated   Active Problems:   Respiratory failure with hypoxia (HCC)   Ventricular tachycardia (HCC)   Cardiogenic shock (HCC)   Acute stroke due to ischemia [facility])   Acute pulmonary embolism without acute cor pulmonale (HCC)  Recurrent VT/bradycardia -Underlying sinus node dysfunction -EP plans to place ICD on Monday.  Was going to place on Friday however wanted [place] to look a little further into possible liver dysfunction.  Cardiogenic shock/chronic systolic and diastolic CHF with biventricular failure -CHF team seeing patient -Strict ins and out +2.2 L -Daily weight Filed Weights   06/15/20 0427 06/16/20 0500  06/17/20 0349  Weight: 88.8 kg 87.7 kg 86.4 kg  -Amiodarone 200 mg BID -Digoxin 0.125 mg daily -Entresto 97-103 mg 1 tablet BID -12/4 Hydralazine 10 mg TID -Spironolactone 25 mg daily  CAD s/p CABG with recent inferior STEMI 04/2020 -See cardiogenic shock  HTN -See cardiogenic shock  HLD -Lipitor 80 mg daily   Acute respiratory failure with hypoxia/pleural effusion  -Acute respiratory failure resolved at least while in resting state. Patient has not seriously ambulated. -12/5 PCXR pending evaluate pleural effusion.   Bilateral lobar and segmental Pulmonary Embolism - incidental findings on stroke workup  - bilateral venous dopplers to evaluate for DVT pending - depending on neuro recommendations, will need anticoagulation- likely heparin gtt for now and eventual DOAC  Dysphagia  Extubated 11/23 - Continue to wean oxygen as tolerated - Pulmonary hygiene- IS, PT, moblize, CPT, NT prn  - aspiration precautions; continue cortrak for EN/ meds; SLP following  Nausea secondary to exaggerated gag response secondary to stroke and recent intubation Improved, remains NPO for pacemaker insertion - SLP to evaluate - Nasogastric tube for medications.  May need a period of nasogastric feeding.  Leukocytosis/ fever presumed HCAP - Complete 7 days of Cefepime 11/29; cultures negative/ normal flora; continue to monitor clinically  AKI- resolved - trend BMET, renal dose meds and avoid nephrotoxins - strict I/Os  Encephalopathy , Acute CVA-Embolic L infarcts -Seen by Neurology  -serial neuro exams -Echocardiogram with bubble -heparin gtt -follow up by stroke tream - delirium precautions - pending ammonia/ UA -Resolved  Deconditioning - PT/OT  Elevated liver enzymes -Per patient and wife has significant history within his family of liver disease,  some of it EtOH related.  Patient does not drink. -Daily ammonia level -12/3 acute hepatitis panel negative -12/3 US abdomen  liver; negative -12/4 slight elevation in liver enzymes, most likely secondary to shock liver, and starting high-dose statin.  Unfortunately stopping high-dose statin not an option given patient's cardiac status. -Believe it would be safe to place ICD.  Will discuss with EP   DVT prophylaxis: Heparin drip Code Status: Full Family Communication: 12/3 wife at bedside discussed plan of care answered all questions Status is: Inpatient    Dispo: The patient is from: Home              Anticipated d/c is to:??              Anticipated d/c date is: 67 / 7              Patient currently unstable      Consultants:  HF tm PCCM EP Neurology   Procedures/Significant Events:  11/21 Admitted cardiology/ intubated/ L/RHC, TVP, PA catheter 11/22  Remains on NE, amio, heparin gtt, decreased UOP.  CVP 8, CI 1.34, co-ox 60-66%, started on milrinone 11/23 Tmax 101.7, w/ increased respiratory secretions prompted the initiation of antibiotics. co-ox 72%, CVP 12, CI 3.2, SVR 1090. Able to titrate off sedation and decrease NE; extubated 14:22 then re-intubated at 18:38.    11/21 R/LHC >   Severe 3 vessel occlusive CAD. 2. Patent LIMA to the LAD 3. Patent SVG to OM1 4. Patent SVG to RCA. Much improved flow from prior PCI. 5. Mildly elevated LVEDP 17 mm Hg 6. Successful placement of temporary transvenous pacemaker.  11/24 TEE >> neg for LV thrombus; EF 30-35%.Inferolateral HK  11/30 CT head>> 1. No acute abnormality. Generalized atrophy 2. Hypodensity right posterior cerebellum most consistent with chronic infarct, not present in 2020. 3. ASPECTS is 10  11/30 CTA head/ neck/ perfusion >> 1. No emergent large vessel occlusion or infarct/ischemia by perfusion. 2. Lobar and segmental pulmonary emboli incidentally noted in the upper lungs. 3. Minimal flow in the hypoplastic right vertebral artery. There is moderate to advanced atheromatous narrowing of the left V4 segment. 4. Carotid  atherosclerosis without flow limiting stenosis or ulceration. 5. Suboptimal bolus.  11/30 CT head>>no acute findings 11/30 CTA head/neck>>no LVO 11/30 MRI brain>>Small acute infarcts involving the left frontal precentral gyrus and the juxtacortical left frontal white matter. Mild associated edema without mass effect 11/30 CT chest>>Bilateral multifocal pulmonary emboli the majority of which appear acute in nature. Some subacute to chronic thrombus is noted within the left lower lobe pulmonary artery. 12/1 echocardiogram with bubble study;Left Ventricle: Left ventricular ejection fraction, by estimation, is 25  to 30%. The left ventricle has severely decreased function.   Right Ventricle: The right ventricular size is mildly enlarged.   IAS/Shunts: Agitated saline contrast was given intravenously to evaluate  for intracardiac shunting. Agitated saline contrast bubble study was  negative, with no evidence of any interatrial shunt.    12/4 US abdomen RUQ; gallbladder sludge without evidence for acute cholecystitis  I have personally reviewed and interpreted all radiology studies and my findings are as above.  VENTILATOR SETTINGS:    Cultures 11/21 SARS2/ flu >>neg 11/21 MRSA PCR >>neg 11/22 BCx 2 >> neg 11/23 sputum >> normal flora 12/3 acute hepatitis panel negative    Antimicrobials: Anti-infectives (From admission, onward)   Start     Ordered Stop   06/16/20 1030  gentamicin (GARAMYCIN) 80 mg in sodium chloride 0.9 %  500 mL irrigation        06/15/20 2108 06/17/20 1030   06/16/20 1030  ceFAZolin (ANCEF) IVPB 2g/100 mL premix        06/15/20 2108 06/17/20 1030   06/09/20 1045  ceFEPIme (MAXIPIME) 2 g in sodium chloride 0.9 % 100 mL IVPB        06/09/20 0948 06/13/20 0329   06/06/20 0800  vancomycin (VANCOREADY) IVPB 750 mg/150 mL  Status:  Discontinued        06/06/20 0713 06/07/20 1201   06/06/20 0800  ceFEPIme (MAXIPIME) 2 g in sodium chloride 0.9 % 100 mL IVPB  Status:   Discontinued        06/06/20 0713 06/09/20 0948       Devices    LINES / TUBES:  11/21 ETT >> 11/23 11/21 R IJ cordis with PA catheter >> 11/21 R femoral TVP >> 11/21 R radial aline >>     Continuous Infusions: . sodium chloride 50 mL/hr at 06/17/20 0500  . sodium chloride    . feeding supplement (OSMOLITE 1.5 CAL) 1,000 mL (06/17/20 0024)  . heparin 1,550 Units/hr (06/17/20 1255)     Objective: Vitals:   06/17/20 0349 06/17/20 0748 06/17/20 0847 06/17/20 1114  BP: (!) 150/82 (!) 150/94 (!) 135/92 (!) 160/91  Pulse: 92 85  81  Resp:  (!) 21 20 (!) 22  Temp: 98.7 F (37.1 C) 98.1 F (36.7 C)  (!) 97.5 F (36.4 C)  TempSrc: Oral Oral  Oral  SpO2: 97% 94%  92%  Weight: 86.4 kg     Height:        Intake/Output Summary (Last 24 hours) at 06/17/2020 1427 Last data filed at 06/17/2020 1116 Gross per 24 hour  Intake 949.55 ml  Output 1200 ml  Net -250.45 ml   Filed Weights   06/15/20 0427 06/16/20 0500 06/17/20 0349  Weight: 88.8 kg 87.7 kg 86.4 kg   Physical Exam:  General: A/O x4, No acute respiratory distress Eyes: negative scleral hemorrhage, negative anisocoria, negative icterus ENT: Negative Runny nose, negative gingival bleeding, Neck:  Negative scars, masses, torticollis, lymphadenopathy, JVD Lungs: Clear to auscultation bilaterally without wheezes or crackles Cardiovascular: Regular rate and rhythm without murmur gallop or rub normal S1 and S2 Abdomen: negative abdominal pain, nondistended, positive soft, bowel sounds, no rebound, no ascites, no appreciable mass Extremities: No significant cyanosis, clubbing, or edema bilateral lower extremities Skin: Negative rashes, lesions, ulcers Psychiatric:  Negative depression, negative anxiety, negative fatigue, negative mania  Central nervous system:  Cranial nerves II through XII intact, tongue/uvula midline, all extremities muscle strength 5/5, sensation intact throughout, negative dysarthria, negative  expressive aphasia, negative receptive aphasia.  .     Data Reviewed: Care during the described time interval was provided by me .  I have reviewed this patient's available data, including medical history, events of note, physical examination, and all test results as part of my evaluation.  CBC: Recent Labs  Lab 06/11/20 0331 06/12/20 0409 06/13/20 0500 06/14/20 0548 06/17/20 0058  WBC 12.3* 14.3* 14.5* 14.2* 16.5*  NEUTROABS  --   --   --   --  11.3*  HGB 13.7 13.8 14.8 14.4 14.5  HCT 43.3 42.3 44.7 41.5 43.9  MCV 94.5 93.6 92.0 88.7 90.7  PLT 222 264 313 363 824*   Basic Metabolic Panel: Recent Labs  Lab 06/12/20 0409 06/12/20 1709 06/13/20 0500 06/13/20 0500 06/13/20 1947 06/14/20 0548 06/15/20 0414 06/16/20 0125 06/16/20 0719 06/17/20 2353  NA   < >  --  141  --   --  139 141  --  139 137  K   < >  --  3.5  --   --  3.3* 3.6  --  4.5 4.1  CL   < >  --  104  --   --  104 105  --  105 104  CO2   < >  --  24  --   --  23 23  --  23 22  GLUCOSE   < >  --  120*  --   --  127* 134*  --  108* 114*  BUN   < >  --  22  --   --  19 20  --  20 17  CREATININE   < >  --  1.13  --   --  0.99 1.05  --  1.11 1.11  CALCIUM   < >  --  8.5*  --   --  8.3* 8.5*  --  8.9 8.8*  MG   < > 1.9 2.0   < > 1.8 1.9 2.1 2.0  --  1.9  PHOS  --  1.5* 1.9*  --  2.0* 2.1*  --   --   --  3.8   < > = values in this interval not displayed.   GFR: Estimated Creatinine Clearance: 68.7 mL/min (by C-G formula based on SCr of 1.11 mg/dL). Liver Function Tests: Recent Labs  Lab 06/16/20 0719 06/17/20 0058  AST 67* 64*  ALT 82* 84*  ALKPHOS 66 66  BILITOT 1.3* 1.5*  PROT 7.3 6.9  ALBUMIN 3.1* 3.0*   No results for input(s): LIPASE, AMYLASE in the last 168 hours. Recent Labs  Lab 06/13/20 1124 06/16/20 0125 06/17/20 0058  AMMONIA 46* 57* 40*   Coagulation Profile: No results for input(s): INR, PROTIME in the last 168 hours. Cardiac Enzymes: No results for input(s): CKTOTAL, CKMB,  CKMBINDEX, TROPONINI in the last 168 hours. BNP (last 3 results) No results for input(s): PROBNP in the last 8760 hours. HbA1C: No results for input(s): HGBA1C in the last 72 hours. CBG: Recent Labs  Lab 06/16/20 1951 06/16/20 2353 06/17/20 0348 06/17/20 0758 06/17/20 1112  GLUCAP 124* 110* 146* 125* 117*   Lipid Profile: No results for input(s): CHOL, HDL, LDLCALC, TRIG, CHOLHDL, LDLDIRECT in the last 72 hours. Thyroid Function Tests: No results for input(s): TSH, T4TOTAL, FREET4, T3FREE, THYROIDAB in the last 72 hours. Anemia Panel: No results for input(s): VITAMINB12, FOLATE, FERRITIN, TIBC, IRON, RETICCTPCT in the last 72 hours. Sepsis Labs: No results for input(s): PROCALCITON, LATICACIDVEN in the last 168 hours.  Recent Results (from the past 240 hour(s))  Surgical PCR screen     Status: None   Collection Time: 06/15/20 10:50 PM   Specimen: Nasal Mucosa; Nasal Swab  Result Value Ref Range Status   MRSA, PCR NEGATIVE NEGATIVE Final   Staphylococcus aureus NEGATIVE NEGATIVE Final    Comment: (NOTE) The Xpert SA Assay (FDA approved for NASAL specimens in patients 75 years of age and older), is one component of a comprehensive surveillance program. It is not intended to diagnose infection nor to guide or monitor treatment. Performed at [facility], [place] [address], [place], [place] [address]          Radiology Studies: US Abdomen Limited RUQ (LIVER/GB)  Result Date: 06/17/2020 CLINICAL DATA:  Elevated liver enzymes EXAM: ULTRASOUND ABDOMEN LIMITED RIGHT UPPER  QUADRANT COMPARISON:  None. FINDINGS: Gallbladder: There is gallbladder sludge without evidence for cholelithiasis or acute cholecystitis. There is no gallbladder wall thickening. The gallbladder is distended. Common bile duct: Diameter: 3 mm Liver: No focal lesion identified. Within normal limits in parenchymal echogenicity. Portal vein is patent on color Doppler imaging with normal direction of blood  flow towards the liver. Other: None. IMPRESSION: Gallbladder sludge without evidence for acute cholecystitis. Electronically Signed   By: Constance Holster M.D.   On: 06/17/2020 00:09        Scheduled Meds: .  stroke: mapping our early stages of recovery book   Does not apply Once  . amiodarone  200 mg Per Tube BID  . atorvastatin  80 mg Per Tube q1800  . chlorhexidine  15 mL Mouth Rinse BID  . Chlorhexidine Gluconate Cloth  6 each Topical Daily  . digoxin  0.125 mg Per Tube Daily  . escitalopram  10 mg Per Tube Daily  . ezetimibe  10 mg Per Tube Daily  . feeding supplement (PROSource TF)  45 mL Per Tube QID  . lactulose  20 g Per Tube TID  . mouth rinse  15 mL Mouth Rinse q12n4p  . sacubitril-valsartan  1 tablet Per Tube BID  . sodium chloride flush  3 mL Intravenous Q12H  . spironolactone  25 mg Per Tube Daily   Continuous Infusions: . sodium chloride 50 mL/hr at 06/17/20 0500  . sodium chloride    . feeding supplement (OSMOLITE 1.5 CAL) 1,000 mL (06/17/20 0024)  . heparin 1,550 Units/hr (06/17/20 1255)     LOS: 13 days    Time spent:40 min    [name], Geraldo Docker, MD [facility] Pager [phone]  If 7PM-7AM, please contact night-coverage [email or web address] Password [email or web address] 06/17/2020, 2:27 PM

## 2020-06-17 NOTE — Progress Notes (Signed)
Progress Note  Patient Name: [name] Date of Encounter: 06/17/2020  [facility] Cardiologist: Sanda , MD    Patient Profile  64 y.o. male with aPMH of CAD (s/p CABG in 2014 with LIMA-LAD, SVG-OM2 and SVG-RCA, s/p angioplasty to proximal SVG-RCA and DES to SVG-RCA anastomosisin 03/2019), chronic combined systolic and diastolic CHF/ICM (EF 82-50% by echo in 06/2019), HTN, HLD,prior CVAand recent [place] angioplasty of the distal bypass graft10/21/21; hx of intermittent confusion  Admitted with  WCT/VT following near syncope EMS intreated with amio bolus' repeat bolus in ER >> asystole >> external pacing>> emergent cath/temp wire placement, intubated sedated. Echo EF 25-30% >>? Thrombus//RV dysfunction  Cath Patent LIMA-LAD; SVG-OM;SVG-RCA; POBA 10/21 on DAPT    PLAN for DDD ICD    Subjective   No CP sob Not sure of the plan  Inpatient Medications    Scheduled Meds: .  stroke: mapping our early stages of recovery book   Does not apply Once  . amiodarone  200 mg Per Tube BID  . aspirin  81 mg Per Tube Daily  . atorvastatin  80 mg Per Tube q1800  . chlorhexidine  15 mL Mouth Rinse BID  . Chlorhexidine Gluconate Cloth  6 each Topical Daily  . clopidogrel  75 mg Per Tube Daily  . digoxin  0.125 mg Per Tube Daily  . escitalopram  10 mg Per Tube Daily  . ezetimibe  10 mg Per Tube Daily  . feeding supplement (PROSource TF)  45 mL Per Tube QID  . gentamicin irrigation  80 mg Irrigation To Cath  . lactulose  20 g Per Tube TID  . mouth rinse  15 mL Mouth Rinse q12n4p  . sacubitril-valsartan  1 tablet Oral BID  . sodium chloride flush  3 mL Intravenous Q12H  . spironolactone  25 mg Per Tube Daily   Continuous Infusions: . sodium chloride 50 mL/hr at 06/17/20 0500  . sodium chloride    .  ceFAZolin (ANCEF) IV    . feeding supplement (OSMOLITE 1.5 CAL) 1,000 mL (06/17/20 0024)  . heparin 1,400 Units/hr (06/17/20 0555)   PRN Meds: acetaminophen  (TYLENOL) oral liquid 160 mg/5 mL, albuterol, guaiFENesin, loperamide HCl, nitroGLYCERIN, ondansetron (ZOFRAN) IV, sodium chloride flush   Vital Signs    Vitals:   06/16/20 1911 06/16/20 2307 06/17/20 0349 06/17/20 0748  BP: (!) 165/99 (!) 154/88 (!) 150/82 (!) 150/94  Pulse: 88 75 92 85  Resp:    (!) 21  Temp: 98.7 F (37.1 C) 98.7 F (37.1 C) 98.7 F (37.1 C) 98.1 F (36.7 C)  TempSrc: Oral Oral Oral Oral  SpO2: 98% 95% 97% 94%  Weight:   86.4 kg   Height:        Intake/Output Summary (Last 24 hours) at 06/17/2020 0801 Last data filed at 06/17/2020 0500 Gross per 24 hour  Intake 949.55 ml  Output 700 ml  Net 249.55 ml   Last 3 Weights 06/17/2020 06/16/2020 06/15/2020  Weight (lbs) 190 lb 6.4 oz 193 lb 5.5 oz 195 lb 12.3 oz  Weight (kg) 86.365 kg 87.7 kg 88.8 kg      Telemetry    Sinus with some VT nonsustained - Personally Reviewed  ECG    No new EKGs - Personally Reviewed  Physical Exam   Well developed but sickly appearing male with feeding tube in place HENT normal Neck supple   Decreased breath sounds Regular rate and rhythm, no murmurs or gallops Abd-soft with active BS No Clubbing  cyanosis edema Skin-warm and dry A & Oriented  Grossly normal sensory and motor function     Labs    High Sensitivity Troponin:   Recent Labs  Lab 06/04/20 1148  TROPONINIHS 157*      Chemistry Recent Labs  Lab 06/15/20 0414 06/16/20 0719 06/17/20 0058  NA 141 139 137  K 3.6 4.5 4.1  CL 105 105 104  CO2 23 23 22   GLUCOSE 134* 108* 114*  BUN 20 20 17   CREATININE 1.05 1.11 1.11  CALCIUM 8.5* 8.9 8.8*  PROT  --  7.3 6.9  ALBUMIN  --  3.1* 3.0*  AST  --  67* 64*  ALT  --  82* 84*  ALKPHOS  --  66 66  BILITOT  --  1.3* 1.5*  GFRNONAA >60 >60 >60  ANIONGAP 13 11 11      Hematology Recent Labs  Lab 06/13/20 0500 06/14/20 0548 06/17/20 0058  WBC 14.5* 14.2* 16.5*  RBC 4.86 4.68 4.84  HGB 14.8 14.4 14.5  HCT 44.7 41.5 43.9  MCV 92.0 88.7 90.7  MCH  30.5 30.8 30.0  MCHC 33.1 34.7 33.0  RDW 12.6 12.7 13.2  PLT 313 363 475*    BNPNo results for input(s): BNP, PROBNP in the last 168 hours.   DDimer No results for input(s): DDIMER in the last 168 hours.   Radiology    06/07/20: CXR  IMPRESSION: 1. Stable line positioning with stable left basilar atelectasis and/or infiltrate. 2. Very small, stable left pleural effusion.   Cardiac Studies   06/05/2020: TTE IMPRESSIONS  1. Technically difficult study with poor echo windows. Definity contrast  given.  2. Left ventricular ejection fraction, by estimation, is 25 to 30%. The  left ventricle has severely decreased function. The left ventricle  demonstrates global hypokinesis with regional variation including apical  dyskineisis. There is moderate left  ventricular hypertrophy. Left ventricular diastolic parameters are  indeterminate. Linear filling defect at the apex, could be thrombus or  false tendon.  3. Right ventricular systolic function was not well visualized. The right  ventricular size is not well visualized.  4. The mitral valve was not well visualized. No evidence of mitral valve  regurgitation.  5. The aortic valve is tricuspid. Aortic valve regurgitation is trivial.  Mild aortic valve sclerosis is present, with no evidence of aortic valve  stenosis.  6. Aortic dilatation noted. There is borderline dilatation of the aortic  root, measuring 39 mm. There is mild dilatation of the ascending aorta,  measuring 40 mm.   Comparison(s): No significant change from prior study. 05/04/2020: LVEF  25-30%, 14 x 6 mm apical filing defect suspicious for LV thrombus. No  change in apical filling defect. Suspect false tendon, however, cannot  exclude thrombus given cardiomyopathy.    06/04/2020: R/LHC  Mid LAD lesion is 100% stenosed.  Prox Cx lesion is 100% stenosed.  Prox RCA lesion is 100% stenosed.  Dist RCA lesion is 20% stenosed.  Non-stenotic Origin to Prox  Graft lesion was previously treated.  Prox Graft lesion is 30% stenosed.  LV end diastolic pressure is mildly elevated.   1. Severe 3 vessel occlusive CAD. 2. Patent LIMA to the LAD 3. Patent SVG to OM1 4. Patent SVG to RCA. Much improved flow from prior PCI. 5. Mildly elevated LVEDP 17 mm Hg 6. Successful placement of temporary transvenous pacemaker.  Plan: Dr Haroldine Laws placed a [name] catheter via the right internal jugular Vein. Will leave temporary transvenous pacemaker in  place so that antiarrhythmic drugs can be used liberally.  Right heart cath demonstrates good cardiac output and right heart pressures so we deferred placing an IABP. Will need right femoral arterial sheath removed manually.  RHC Findings:  Ao = 174/111 (138)  LV = 169/16 RA =  7 RV = 41/6 PA = 37/20 (28) PCW = 14 Fick cardiac output/index = 11.6/5.7 PVR = 1.2 WU FA sat = 99% PA sat = 85%, 86% SVC sat = 89%  High cardiac output with normal filling pressures. No evidence of intracardiac shunting.     05/04/2020: TTE IMPRESSIONS  1. Since the last study on 06/23/2019 LVEF has decreased from 35-40% to  25-30%, there is a suspicion for left ventricular apical thrombus  measuring 14 x 6 mm.  2. Left ventricular ejection fraction, by estimation, is 25 to 30%. The  left ventricle has severely decreased function. The left ventricle  demonstrates global hypokinesis. The left ventricular internal cavity size  was moderately dilated. Left  ventricular diastolic parameters are consistent with Grade I diastolic  dysfunction (impaired relaxation). Elevated left atrial pressure.  3. Right ventricular systolic function is moderately reduced. The right  ventricular size is moderately enlarged. There is mildly elevated  pulmonary artery systolic pressure. The estimated right ventricular  systolic pressure is 43.3 mmHg.  4. Left atrial size was moderately dilated.  5. Right atrial size was moderately  dilated.  6. The mitral valve is normal in structure. Mild mitral valve  regurgitation. No evidence of mitral stenosis.  7. The aortic valve is normal in structure. Aortic valve regurgitation is  mild. No aortic stenosis is present.  8. The inferior vena cava is normal in size with greater than 50%  respiratory variability, suggesting right atrial pressure of 3 mmHg.    Patient Profile     64 y.o. male with aPMH of CAD (s/p CABG in 2014 with LIMA-LAD, SVG-OM2 and SVG-RCA, s/p angioplasty to proximal SVG-RCA and DES to SVG-RCA anastomosisin 03/2019), chronic combined systolic and diastolic CHF/ICM (EF 29-51% by echo in 06/2019), HTN, HLD,prior CVAand recent STEMIwith angioplasty of the distal bypass graft10/21/21  EMS [place]ope at [place] found by EMS in WCT/VT treated with amio bolus' repeat bolus in ER >> asystole >> external pacing>> emergent cath/temp wire placement, intubated sedated.  Assessment & Plan    VT   Asystole in setting of amio  Ischemic cardiomyopathy  Apical thrombus  DVT and PE  Stroke acute  CHF acute/chronic systolic  Hypertension--permissive at this point   Hypertransamiasemia -acute    Continue amio for VT and without interval brady-- will need to think of the mechanism of the asystolic event  Issue of anticoagulation is paramount and complicated and have reached out to CL and DB-- 1-- does he need adjunctive DAPT-- I am not sure of clear indication with recent POBA .  2)  Predominant experience for anticoagulation with LV thrombus has been with coumadin, although recent ( small paper --letter in [place]) suggested NOACs may be appropriate. However, with upcoming implant and HIGH risk situation for clots, (DVT/PE and CVA) would strongly consider initiation of coumadin, continued heparin until INR therapeutic and then device implant on full dose anticoagulation with coumadin, for which there is a great deal of experience regarding  bleeding concerns being about 2.5 % compared to 20% with heparin and some intermediate number with NOAC,all these would certainly be increased with DAPT and holding one or both and allowing some time for it  to wash out prior to implant may be ideal *  Volume status euvolemic  Continue current fluids     Elevated alt/AST new--? Cause   Plan is CIR   Per speech perhaps his diet can be advanced         For questions or updates, please contact [place] Please consult [email or web address] for contact info under        Signed, Virl Axe, MD  06/17/2020, 8:01 AM

## 2020-06-17 NOTE — Progress Notes (Signed)
ANTICOAGULATION CONSULT NOTE  Pharmacy Consult for Heparin Indication: pulmonary embolus  No Known Allergies  Patient Measurements: Height: 5\' 7"  (170.2 cm) Weight: 87.7 kg (193 lb 5.5 oz) IBW/kg (Calculated) : 66.1 Heparin Dosing Weight: 88kg  Vital Signs: Temp: 98.7 F (37.1 C) (12/03 2307) Temp Source: Oral (12/03 2307) BP: 154/88 (12/03 2307) Pulse Rate: 75 (12/03 2307)  Labs: Recent Labs    06/14/20 0548 06/14/20 0548 06/15/20 0414 06/16/20 0719 06/16/20 1649 06/17/20 0058  HGB 14.4  --   --   --   --  14.5  HCT 41.5  --   --   --   --  43.9  PLT 363  --   --   --   --  475*  HEPARINUNFRC 0.33   < > 0.27*  --  <0.10* 0.22*  CREATININE 0.99  --  1.05 1.11  --   --    < > = values in this interval not displayed.    Estimated Creatinine Clearance: 69.2 mL/min (by C-G formula based on SCr of 1.11 mg/dL).   Assessment: 21 yoM admitted with VT and concern for ACS. Cath clean and heparin resumed for presumed LV thrombus. Repeat TEE 11/24 showed no thrombus so heparin discontinued. Patient noted to have acute CVA 11/30 am, CT angio demonstrated incidental finding of PE. Neurology cleared stroke protocol heparin, CCM consulted pharmacy to begin heparin infusion. Will defer bolus and target lower goal.  Heparin resumed on 1200 units/hr (12 ml/hr) this PM after ICD was cancelled. First heparin level came back undetectable at <0.10. RN states patient got up with physical therapy a couple times - Heparin infusion was beeping occluded but unsure for how long but notes <1hr. Due to this, will be cautiously increase and recheck. No bleeding noted.   12/4 AM update:  Heparin level below goal but trending up No issues per RN  Goal of Therapy:  Heparin level 0.3-0.5 units/ml Monitor platelets by anticoagulation protocol: Yes   Plan:  -Increase Heparin to 1400 units/hr -Re-check 6-8hr heparin level -Daily heparin level and CBC while on therapy  Narda Bonds, PharmD,  BCPS Clinical Pharmacist Phone: [phone]

## 2020-06-17 NOTE — Progress Notes (Addendum)
Occupational Therapy Treatment Patient Details Name: [name] MRN: [id number] DOB: 1956-06-12 Today's Date: 06/17/2020    History of present illness 64 yo with a hx as stated above who presented to [facility] 06/04/20 after having a pre-syncopal symptoms including dizzinezss while at church earlier today found to be in VT (not wearing Life Vest as prescribed on recent hospital discharge) on EMS arrival. Has not been compliant with LifeVest stating the battery pack too bulky. PMH of CAD (s/p CABG in 2014 with LIMA-LAD, SVG-OM2 and SVG-RCA, s/p angioplasty to proximal SVG-RCA and DES to SVG-RCA anastomosis in 03/2019), chronic combined systolic and diastolic CHF/ICM (EF 51-70% by echo in 06/2019), HTN, HLD, prior CVA and recent STEMI with angioplasty of the distal bypass graft 05/04/20. Admitted 06/04/20 for treatment of wide complex tachycardia. Intubated 11/21-Extubated 11/23 and reintubated extubated 11/26 R radial aline 11/23 TEE 11/24: EF 30-35% no evidence of LV clot.  11/30 pt noted to have increased R sided weakness by OT, code stroke called MRI revealed Small acute infarcts involving the left frontal precentral gyrus   OT comments  Pt making steady progress towards OT goals this session. Pt continues to present with decreased activity tolerance, generalized weakness and decreased ability to care for self. Session focus on functional mobility as precursor to higher level BADLs. Overall, pt requires min guard for household distance functional mobility with RW. Pt on RA with O2 96% post mobility. Per RN pt with 9 beat run of vtach during ambulation however pt with no s/s and did not appear to be in distress as pt conversing with OTA during ambulation. Initiated education related to energy conservation in terms of using 3n1 for tub shower seat with pt agreeable to DME recs. Pt would continue to benefit from skilled occupational therapy while admitted and after d/c to address the below listed limitations  in order to improve overall functional mobility and facilitate independence with BADL participation. DC plan remains appropriate, will follow acutely per POC.     Follow Up Recommendations  CIR;Supervision/Assistance - 24 hour    Equipment Recommendations  3 in 1 bedside commode    Recommendations for Other Services      Precautions / Restrictions Precautions Precautions: Fall Precaution Comments: [place] Restrictions Weight Bearing Restrictions: No       Mobility Bed Mobility Overal bed mobility: Needs Assistance Bed Mobility: Supine to Sit;Sit to Supine     Supine to sit: HOB elevated;Min assist Sit to supine: Supervision;HOB elevated   General bed mobility comments: light MIN A with pt reaching out for therapist to assist with elevating trunk, supervision to return to supine with HOB elevated  Transfers Overall transfer level: Needs assistance Equipment used: Rolling walker (2 wheeled) Transfers: Sit to/from Stand Sit to Stand: Supervision         General transfer comment: pt sit<>stand impulsively from EOB with supervision    Balance Overall balance assessment: Needs assistance Sitting-balance support: Feet supported;No upper extremity supported Sitting balance-Leahy Scale: Fair     Standing balance support: Bilateral upper extremity supported   Standing balance comment: reliant on BUE support during ambulation but able to static stand with no UE support and close min guard                           ADL either performed or assessed with clinical judgement   ADL Overall ADL's : Needs assistance/impaired  Toilet Transfer: Chief of Staff Details (indicate cue type and reason): simulated via functionalmobilityu with RW, min guard fro safety         Functional mobility during ADLs: Rolling walker;Min guard General ADL Comments: increased SOB noted however SpO2 96% post ambulation      Vision       Perception     Praxis      Cognition Arousal/Alertness: Awake/alert Behavior During Therapy: WFL for tasks assessed/performed;Flat affect Overall Cognitive Status: Within Functional Limits for tasks assessed                                 General Comments: pt appeared with flat affect expressing feeling of being overwhelmed d/t both his brother and sister passing away in the past 3 months- offered chaplain services with pt declining.        Exercises     Shoulder Instructions       General Comments pt on RA during session, SpO2 96% post ambulation, pt with 9 beat run of vtach during session per RN however pt with no s/s. pt reports a lot of feelings of grief related to the recent passin of  both his brother and sister in the past 3 months- offered emotional support as well as chaplain support with pt declining chaplain servies. 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 OT Goal Formulation: With patient Time For Goal Achievement: 06/27/20 Potential to Achieve Goals: Good  Plan Discharge plan remains appropriate;Frequency remains appropriate    Co-evaluation                  AM-PAC OT "6 Clicks" Daily Activity     Outcome Measure   Help from another person eating meals?: None Help from another person taking care of personal grooming?: A Little Help from another person toileting, which includes using toliet, bedpan, or urinal?: A Little Help from another person bathing (including washing, rinsing, drying)?: A Little Help from another person to put on and taking off regular upper body clothing?: A Little Help from another person to put on and taking off regular lower body clothing?: A Lot 6 Click Score: 18    End of Session Equipment Utilized During Treatment: Gait belt;Rolling walker  OT Visit Diagnosis: Unsteadiness on feet (R26.81);Other abnormalities of gait and mobility (R26.89);Muscle weakness (generalized) (M62.81)   Activity Tolerance Patient tolerated treatment well   Patient Left in bed;with call bell/phone within reach;with bed alarm set   Nurse Communication Mobility status  Time: 7591-6384 OT Time Calculation (min): 22 min  Charges: OT General Charges $OT Visit: 1 Visit OT Treatments $Therapeutic Activity: 8-22 mins  Lanier Clam., COTA/L Acute Rehabilitation Services [phone] [place] 06/17/2020, 3:55 PM

## 2020-06-18 ENCOUNTER — Inpatient Hospital Stay (HOSPITAL_COMMUNITY): Payer: Medicare HMO

## 2020-06-18 LAB — HEPARIN LEVEL (UNFRACTIONATED): Heparin Unfractionated: 0.33 IU/mL (ref 0.30–0.70)

## 2020-06-18 LAB — PHOSPHORUS: Phosphorus: 4 mg/dL (ref 2.5–4.6)

## 2020-06-18 LAB — COMPREHENSIVE METABOLIC PANEL
ALT: 83 U/L — ABNORMAL HIGH (ref 0–44)
AST: 53 U/L — ABNORMAL HIGH (ref 15–41)
Albumin: 2.9 g/dL — ABNORMAL LOW (ref 3.5–5.0)
Alkaline Phosphatase: 57 U/L (ref 38–126)
Anion gap: 11 (ref 5–15)
BUN: 20 mg/dL (ref 8–23)
CO2: 23 mmol/L (ref 22–32)
Calcium: 8.5 mg/dL — ABNORMAL LOW (ref 8.9–10.3)
Chloride: 106 mmol/L (ref 98–111)
Creatinine, Ser: 1.06 mg/dL (ref 0.61–1.24)
GFR, Estimated: >60 mL/min (ref >60)
Glucose, Bld: 112 mg/dL — ABNORMAL HIGH (ref 70–99)
Potassium: 4.3 mmol/L (ref 3.5–5.1)
Sodium: 140 mmol/L (ref 135–145)
Total Bilirubin: 1 mg/dL (ref 0.3–1.2)
Total Protein: 7.1 g/dL (ref 6.5–8.1)

## 2020-06-18 LAB — CBC WITH DIFFERENTIAL/PLATELET
Abs Immature Granulocytes: 0.12 10*3/uL — ABNORMAL HIGH (ref 0.00–0.07)
Basophils Absolute: 0.1 10*3/uL (ref 0.0–0.1)
Basophils Relative: 1 %
Eosinophils Absolute: 0.5 10*3/uL (ref 0.0–0.5)
Eosinophils Relative: 3 %
HCT: 42.9 % (ref 39.0–52.0)
Hemoglobin: 14.1 g/dL (ref 13.0–17.0)
Immature Granulocytes: 1 %
Lymphocytes Relative: 20 %
Lymphs Abs: 3 10*3/uL (ref 0.7–4.0)
MCH: 30.3 pg (ref 26.0–34.0)
MCHC: 32.9 g/dL (ref 30.0–36.0)
MCV: 92.3 fL (ref 80.0–100.0)
Monocytes Absolute: 1.2 10*3/uL — ABNORMAL HIGH (ref 0.1–1.0)
Monocytes Relative: 8 %
Neutro Abs: 9.8 10*3/uL — ABNORMAL HIGH (ref 1.7–7.7)
Neutrophils Relative %: 67 %
Platelets: 458 10*3/uL — ABNORMAL HIGH (ref 150–400)
RBC: 4.65 MIL/uL (ref 4.22–5.81)
RDW: 13.3 % (ref 11.5–15.5)
WBC: 14.7 10*3/uL — ABNORMAL HIGH (ref 4.0–10.5)
nRBC: 0 % (ref 0.0–0.2)

## 2020-06-18 LAB — MAGNESIUM: Magnesium: 1.9 mg/dL (ref 1.7–2.4)

## 2020-06-18 LAB — GLUCOSE, CAPILLARY
Glucose-Capillary: 102 mg/dL — ABNORMAL HIGH (ref 70–99)
Glucose-Capillary: 103 mg/dL — ABNORMAL HIGH (ref 70–99)
Glucose-Capillary: 120 mg/dL — ABNORMAL HIGH (ref 70–99)
Glucose-Capillary: 122 mg/dL — ABNORMAL HIGH (ref 70–99)
Glucose-Capillary: 126 mg/dL — ABNORMAL HIGH (ref 70–99)
Glucose-Capillary: 95 mg/dL (ref 70–99)

## 2020-06-18 LAB — AMMONIA: Ammonia: 31 umol/L (ref 9–35)

## 2020-06-18 MED ORDER — HYDRALAZINE HCL 10 MG PO TABS
10.0000 mg | ORAL_TABLET | Freq: Three times a day (TID) | ORAL | Status: DC
  Administered 2020-06-18 – 2020-06-23 (×15): 10 mg
  Filled 2020-06-18 (×14): qty 1

## 2020-06-18 MED ORDER — ASPIRIN 81 MG PO CHEW
81.0000 mg | CHEWABLE_TABLET | Freq: Every day | ORAL | Status: DC
  Administered 2020-06-18: 81 mg via ORAL
  Filled 2020-06-18: qty 1

## 2020-06-18 NOTE — Progress Notes (Signed)
Progress Note  Patient Name: [name] Date of Encounter: 06/18/2020  [facility] Cardiologist: Sanda , MD    Patient Profile  64 y.o. male with a PMH of CAD (s/p CABG in 2014 with LIMA-LAD, SVG-OM2 and SVG-RCA, s/p angioplasty to proximal SVG-RCA and DES to SVG-RCA anastomosis in 03/2019), chronic combined systolic and diastolic CHF/ICM (EF 22-29% by echo in 06/2019), HTN, HLD, prior CVA and recent STEMI with angioplasty of the distal bypass graft 05/04/20; hx of intermittent confusion   Admitted with  WCT/VT following near syncope EMS intreated with amio bolus' repeat bolus in ER >> asystole >> external pacing>> emergent cath/temp wire placement, intubated sedated. Echo EF 25-30% >>? Thrombus//RV dysfunction  Cath Patent LIMA-LAD; SVG-OM;SVG-RCA; POBA 10/21 on DAPT    PLAN for DDD ICD    Subjective   No CP sob Not sure of the plan  Inpatient Medications    Scheduled Meds:   stroke: mapping our early stages of recovery book   Does not apply Once   amiodarone  200 mg Per Tube BID   atorvastatin  80 mg Per Tube q1800   chlorhexidine  15 mL Mouth Rinse BID   Chlorhexidine Gluconate Cloth  6 each Topical Daily   digoxin  0.125 mg Per Tube Daily   escitalopram  10 mg Per Tube Daily   ezetimibe  10 mg Per Tube Daily   feeding supplement (PROSource TF)  45 mL Per Tube QID   hydrALAZINE  10 mg Per Tube Q8H   lactulose  20 g Per Tube TID   mouth rinse  15 mL Mouth Rinse q12n4p   sacubitril-valsartan  1 tablet Per Tube BID   sodium chloride flush  3 mL Intravenous Q12H   spironolactone  25 mg Per Tube Daily   Continuous Infusions:  sodium chloride 50 mL/hr at 06/17/20 0500   sodium chloride     feeding supplement (OSMOLITE 1.5 CAL) 1,000 mL (06/17/20 2014)   heparin 1,550 Units/hr (06/18/20 0004)   PRN Meds: acetaminophen (TYLENOL) oral liquid 160 mg/5 mL, albuterol, guaiFENesin, loperamide HCl, nitroGLYCERIN, ondansetron (ZOFRAN) IV, sodium chloride flush    Vital Signs    Vitals:   06/18/20 0519 06/18/20 0810 06/18/20 0900 06/18/20 1111  BP: (!) 145/96 (!) 148/88  (!) 136/96  Pulse:  71 72 70  Resp: 19 18  17   Temp: 98.9 F (37.2 C) 98 F (36.7 C)  98.6 F (37 C)  TempSrc: Oral Oral  Oral  SpO2: 95% 94%  96%  Weight:      Height:        Intake/Output Summary (Last 24 hours) at 06/18/2020 1220 Last data filed at 06/18/2020 7989 Gross per 24 hour  Intake 2277.53 ml  Output 600 ml  Net 1677.53 ml   Last 3 Weights 06/18/2020 06/17/2020 06/16/2020  Weight (lbs) 191 lb 190 lb 6.4 oz 193 lb 5.5 oz  Weight (kg) 86.637 kg 86.365 kg 87.7 kg      Telemetry   2 VTNS and sinus with pauses  - Personally Reviewed  ECG    No new EKGs - Personally Reviewed  Physical Exam   Well developed and nourished in no acute distress HENT normal Neck supple with JVP p Clear Regular rate and rhythm, no murmurs or gallops Abd-soft with active BS No Clubbing cyanosis edema Skin-warm and dry A & Oriented  Grossly normal sensory and motor function      Labs    High Sensitivity Troponin:   Recent  Labs  Lab 06/04/20 1148  TROPONINIHS 157*      Chemistry Recent Labs  Lab 06/16/20 0719 06/17/20 0058 06/18/20 0206  NA 139 137 140  K 4.5 4.1 4.3  CL 105 104 106  CO2 23 22 23   GLUCOSE 108* 114* 112*  BUN 20 17 20   CREATININE 1.11 1.11 1.06  CALCIUM 8.9 8.8* 8.5*  PROT 7.3 6.9 7.1  ALBUMIN 3.1* 3.0* 2.9*  AST 67* 64* 53*  ALT 82* 84* 83*  ALKPHOS 66 66 57  BILITOT 1.3* 1.5* 1.0  GFRNONAA >60 >60 >60  ANIONGAP 11 11 11      Hematology Recent Labs  Lab 06/14/20 0548 06/17/20 0058 06/18/20 0206  WBC 14.2* 16.5* 14.7*  RBC 4.68 4.84 4.65  HGB 14.4 14.5 14.1  HCT 41.5 43.9 42.9  MCV 88.7 90.7 92.3  MCH 30.8 30.0 30.3  MCHC 34.7 33.0 32.9  RDW 12.7 13.2 13.3  PLT 363 475* 458*    BNPNo results for input(s): BNP, PROBNP in the last 168 hours.   DDimer No results for input(s): DDIMER in the last 168 hours.    Radiology    06/07/20: CXR  IMPRESSION: 1. Stable line positioning with stable left basilar atelectasis and/or infiltrate. 2. Very small, stable left pleural effusion.   Cardiac Studies   06/05/2020: TTE IMPRESSIONS  1. Technically difficult study with poor echo windows. Definity contrast  given.   2. Left ventricular ejection fraction, by estimation, is 25 to 30%. The  left ventricle has severely decreased function. The left ventricle  demonstrates global hypokinesis with regional variation including apical  dyskineisis. There is moderate left  ventricular hypertrophy. Left ventricular diastolic parameters are  indeterminate. Linear filling defect at the apex, could be thrombus or  false tendon.   3. Right ventricular systolic function was not well visualized. The right  ventricular size is not well visualized.   4. The mitral valve was not well visualized. No evidence of mitral valve  regurgitation.   5. The aortic valve is tricuspid. Aortic valve regurgitation is trivial.  Mild aortic valve sclerosis is present, with no evidence of aortic valve  stenosis.   6. Aortic dilatation noted. There is borderline dilatation of the aortic  root, measuring 39 mm. There is mild dilatation of the ascending aorta,  measuring 40 mm.   Comparison(s): No significant change from prior study. 05/04/2020: LVEF  25-30%, 14 x 6 mm apical filing defect suspicious for LV thrombus. No  change in apical filling defect. Suspect false tendon, however, cannot  exclude thrombus given cardiomyopathy.    06/04/2020: R/LHC Mid LAD lesion is 100% stenosed. Prox Cx lesion is 100% stenosed. Prox RCA lesion is 100% stenosed. Dist RCA lesion is 20% stenosed. Non-stenotic Origin to Prox Graft lesion was previously treated. Prox Graft lesion is 30% stenosed. LV end diastolic pressure is mildly elevated.   1. Severe 3 vessel occlusive CAD. 2. Patent LIMA to the LAD 3. Patent SVG to OM1 4. Patent SVG  to RCA. Much improved flow from prior PCI. 5. Mildly elevated LVEDP 17 mm Hg 6. Successful placement of temporary transvenous pacemaker.   Plan: Dr Haroldine Laws placed a [name] catheter via the right internal jugular Vein. Will leave temporary transvenous pacemaker in place so that antiarrhythmic drugs can be used liberally.  Right heart cath demonstrates good cardiac output and right heart pressures so we deferred placing an IABP. Will need right femoral arterial sheath removed manually.   RHC Findings:   Ao =  174/111 (138)  LV = 169/16 RA =  7 RV = 41/6 PA = 37/20 (28) PCW = 14 Fick cardiac output/index = 11.6/5.7 PVR = 1.2 WU FA sat = 99% PA sat = 85%, 86% SVC sat = 89%   High cardiac output with normal filling pressures. No evidence of intracardiac shunting.     05/04/2020: TTE IMPRESSIONS  1. Since the last study on 06/23/2019 LVEF has decreased from 35-40% to  25-30%, there is a suspicion for left ventricular apical thrombus  measuring 14 x 6 mm.   2. Left ventricular ejection fraction, by estimation, is 25 to 30%. The  left ventricle has severely decreased function. The left ventricle  demonstrates global hypokinesis. The left ventricular internal cavity size  was moderately dilated. Left  ventricular diastolic parameters are consistent with Grade I diastolic  dysfunction (impaired relaxation). Elevated left atrial pressure.   3. Right ventricular systolic function is moderately reduced. The right  ventricular size is moderately enlarged. There is mildly elevated  pulmonary artery systolic pressure. The estimated right ventricular  systolic pressure is 16.1 mmHg.   4. Left atrial size was moderately dilated.   5. Right atrial size was moderately dilated.   6. The mitral valve is normal in structure. Mild mitral valve  regurgitation. No evidence of mitral stenosis.   7. The aortic valve is normal in structure. Aortic valve regurgitation is  mild. No aortic stenosis is  present.   8. The inferior vena cava is normal in size with greater than 50%  respiratory variability, suggesting right atrial pressure of 3 mmHg.    Patient Profile     64 y.o. male with a PMH of CAD (s/p CABG in 2014 with LIMA-LAD, SVG-OM2 and SVG-RCA, s/p angioplasty to proximal SVG-RCA and DES to SVG-RCA anastomosis in 03/2019), chronic combined systolic and diastolic CHF/ICM (EF 09-60% by echo in 06/2019), HTN, HLD, prior CVA and recent STEMI with angioplasty of the distal bypass graft 05/04/20  [place] found by EMS in [place]/VT treated with amio bolus' repeat bolus in ER >> asystole >> external pacing>> emergent cath/temp wire placement, intubated sedated.  Assessment & Plan    VT   Asystole in setting of amio  Ischemic cardiomyopathy  Apical thrombus  DVT and PE  Stroke acute  CHF acute/chronic systolic  Hypertension--permissive at this point   Hypertransamiasemia -acute   Lengthy discussions re strategy for proceeding  Dr CL anticipates proceeding with device implant, stopping heparin prior and initiating noac after  I will hold his ASA for now and it can be resumed-- was not able to have a discussion with dr [facility] over the weekend about APT options  Will hold NPO and DR CL will see him in am and make a decision about proceeding     Elevated alt/AST new--? Cause stable            For questions or updates, please contact [place] Please consult [email or web address] for contact info under        Signed, Virl Axe, MD  06/18/2020, 12:20 PM

## 2020-06-18 NOTE — Progress Notes (Signed)
ANTICOAGULATION CONSULT NOTE  Pharmacy Consult for Heparin Indication: pulmonary embolus and DVTs  No Known Allergies  Patient Measurements: Height: 5\' 7"  (170.2 cm) Weight: 86.6 kg (191 lb) IBW/kg (Calculated) : 66.1 Heparin Dosing Weight: 86kg  Vital Signs: Temp: 98 F (36.7 C) (12/05 0810) Temp Source: Oral (12/05 0810) BP: 148/88 (12/05 0810) Pulse Rate: 72 (12/05 0900)  Labs: Recent Labs    06/16/20 0719 06/16/20 1649 06/17/20 0058 06/17/20 0058 06/17/20 1032 06/17/20 1942 06/18/20 0206  HGB  --   --  14.5  --   --   --  14.1  HCT  --   --  43.9  --   --   --  42.9  PLT  --   --  475*  --   --   --  458*  HEPARINUNFRC  --    < > 0.22*   < > 0.20* 0.39 0.33  CREATININE 1.11  --  1.11  --   --   --  1.06   < > = values in this interval not displayed.    Estimated Creatinine Clearance: 72 mL/min (by C-G formula based on SCr of 1.06 mg/dL).   Assessment: 64 yo M admitted with VT and concern for ACS. Cath clean and heparin resumed for presumed LV thrombus. Repeat TEE 11/24 showed no thrombus so heparin discontinued. Patient noted to have acute CVA 11/30 am, CT angio demonstrated incidental finding of PE.  Dopplers also + bilateral DVTs.  Neurology cleared for stroke protocol heparin, CCM consulted pharmacy to begin heparin infusion. Will target lower goal range.  Heparin level remains therapeutic (HL 0.33, goal of 0.3-0.5).  No bleeding or issues noted per RN.  Goal of Therapy:  Heparin level 0.3-0.5 units/ml Monitor platelets by anticoagulation protocol: Yes   Plan:  - Continue Heparin at 1550 units/hr - Daily heparin level and CBC - Will continue to monitor for any signs/symptoms of bleeding   Thank you for allowing pharmacy to be a part of this patient's care.  [employer], Pharm.D., BCPS Clinical Pharmacist  **Pharmacist phone directory can be found on [email or web address] listed under [place].  06/18/2020 9:44 AM

## 2020-06-18 NOTE — PMR Pre-admission (Signed)
PMR Admission Coordinator Pre-Admission Assessment  Patient: [name] is an 64 y.o., male MRN: [id number] DOB: 11/06/1955 Height: '5\' 7"'  (170.2 cm) Weight: 82.5 kg (scale B)              Insurance Information HMO: yes    PPO:      PCP:      IPA:      80/20:      OTHER:  PRIMARY: [employer]      Policy#: [id number]      Subscriber: patient CM Name: Shirlean Mylar      Phone#: [phone]     Fax#: [phone] Pre-Cert#: [id number]      Employer:  Josem Kaufmann provided by Shirlean Mylar for admit to CIR with admit date listed as 06/21/20. Auth is good for 5 days. Weekly updates are to be send to Jeanette Caprice (p): [phone] (f): [phone] Benefits:  Phone #: online     Name: [email or web address] Eff. Date: 08/16/2019 -07/14/2020     Deduct: does not have for in-network providers      Out of Pocket Max: $3,900 ($1,191.29 met)       Life Max:   CIR: $295/day co-pay for days 1-6, $0/day co-pay for days 7-90      SNF: $0/day co-pay for days 1-20, $184/day co-pay for days 21-100; limited to 100 days/cal yr Outpatient: $10-$40 per visit     Home Health: 100% coverage, 0% co-insurance; limited by medical necessity      DME: 80% coverage, 20% co-insurnace     Providers:   SECONDARY:       Policy#:       Phone#:   Development worker, community:       Phone#:   [employer] for patients in Inpatient Rehabilitation Facilities with attached Privacy Act [place] Records was provided and verbally reviewed with: Patient  Emergency Contact Information Contact Information    Name Relation Home Work Mobile   [place] Spouse [phone]       Current Medical History  Patient Admitting Diagnosis: acute embolic left sided infarcts  History of Present Illness: Pt is a 64 y.o. male with history of CAD s/p CABG with recent NSTEMI 10/20 with revascularization and question of LV thrombus and EF 25% therefore LifeVest placed. He was admitted on 06/04/20 with VT arrest in  setting of severe ICM and treated with required  Amiodarone, lidocaine, atropine as well as external pacing and was intubated for air way protection. He underwent cardiac cath showing normal anatomy. 2D echo showed EF 25-30% with linear filling defect and was started on IV heparin due to concerns of thrombus. He had difficulty with vent wean and developed fevers with leucocytosis and purulent respiratory secretions. . Cefipime/Vanc added due to concern so of PNA.  TEE done 11/24 showing EF 30-35% and no thrombus, PFO or ASD noted. He was maintained on IV amiodarone with plans for ICD. He tolerated extubation and NGT placed for nutritional support.  placement post extubation.  On 11/30, he was found to have RUE weakness as well as speech difficulty with confusion therefore code stroke initiated.     CTA head neck was negative for LVO or ischemia but showed lobar and segmental PE in upper lungs. CTA lungs done for follow up and showed bilateral multifocal PE --majority acute in addition to subacute and chronic thrombus in LLL PA.  MRI brain done revealing small acute infarcts in left frontal precentral gyrus and juxtacortical left frontal white matter with mild edema.  Dr. Rory Percy recommended addition of anticoagulation for treatment of embolic stroke as well as bilateral PE's. He had 60 minutes of slavo's of NSVT on 12/01 with plans for ICD placement but has had intermittent issues with confusion. Per chart, patient has been having intermittent confusion with memory deficits for the past year. Cardiology would like to defer ICD placement till next week depending on WE events.Therapy ongoing and patient noted to have unsteady gait with fatigue and SOB with activity. CIR recommended due to functional decline. Pt is to admit to CIR on 06/25/2020.   Complete NIHSS TOTAL: 0 Glasgow Coma Scale Score: 15  Past Medical History  Past Medical History:  Diagnosis Date   Borderline diabetic    Coronary artery disease     a. s/p CABG in 2014 with LIMA-LAD, SVG-OM2 and SVG-RC b. 03/2019: STEMI and s/p angioplasty to proximal SVG-RCA and DES to SVG-RCA anastomosis c. 04/2020: recurrent STEMI with angioplasty of SVG-RCA   Dyslipidemia 01/11/2013   Hiatal hernia    Hypertension    Mini stroke ([place])    2   S/P CABG x 3    STEMI (ST elevation myocardial infarction) ([place])    Stroke ([place])     Family History  family history includes Cancer in his father; Heart attack in his brother, father, and sister; Hyperlipidemia in his brother and father; Hypertension in his brother, father, and sister.  Prior Rehab/Hospitalizations:  Has the patient had prior rehab or hospitalizations prior to admission? Yes  Has the patient had major surgery during 100 days prior to admission? No  Current Medications   Current Facility-Administered Medications:     stroke: mapping our early stages of recovery book, , Does not apply, Once, Vickie Epley, MD   (feeding supplement) PROSource Plus liquid 30 mL, 30 mL, Oral, BID BM, Alma Friendly, MD, 30 mL at 06/25/20 1003   acetaminophen (TYLENOL) tablet 650 mg, 650 mg, Oral, Q4H PRN, Donnamae Jude, RPH   albuterol (PROVENTIL) (2.5 MG/3ML) 0.083% nebulizer solution 2.5 mg, 2.5 mg, Nebulization, Q4H PRN, Lars Mage T, MD, 2.5 mg at 06/09/20 2027   amiodarone (PACERONE) tablet 200 mg, 200 mg, Oral, BID, Donnamae Jude, RPH, 200 mg at 06/25/20 1004   apixaban (ELIQUIS) tablet 5 mg, 5 mg, Oral, BID, Lars Mage T, MD, 5 mg at 06/25/20 1003   aspirin EC tablet 81 mg, 81 mg, Oral, Daily, Donnamae Jude, RPH, 81 mg at 06/25/20 1003   atorvastatin (LIPITOR) tablet 80 mg, 80 mg, Oral, q1800, Donnamae Jude, RPH, 80 mg at 06/24/20 1816   chlorhexidine (PERIDEX) 0.12 % solution 15 mL, 15 mL, Mouth Rinse, BID, Vickie Epley, MD, 15 mL at 06/25/20 1004   Chlorhexidine Gluconate Cloth 2 % PADS 6 each, 6 each, Topical, Daily, Vickie Epley, MD, 6 each at  06/24/20 1007   clopidogrel (PLAVIX) tablet 75 mg, 75 mg, Oral, Daily, Shirley Friar, PA-C, 75 mg at 06/25/20 1003   digoxin (LANOXIN) tablet 0.125 mg, 0.125 mg, Oral, Daily, Donnamae Jude, RPH, 0.125 mg at 06/25/20 1006   escitalopram (LEXAPRO) tablet 10 mg, 10 mg, Oral, Daily, Donnamae Jude, RPH, 10 mg at 06/25/20 1003   ezetimibe (ZETIA) tablet 10 mg, 10 mg, Oral, Daily, Donnamae Jude, RPH, 10 mg at 06/25/20 1003   feeding supplement (ENSURE ENLIVE / ENSURE PLUS) liquid 237 mL, 237 mL, Oral, QID, Alma Friendly, MD, 237 mL at 06/23/20 1752   guaiFENesin (ROBITUSSIN) 100  MG/5ML solution 100 mg, 5 mL, Oral, Q4H PRN, Donnamae Jude, RPH   hydrALAZINE (APRESOLINE) tablet 10 mg, 10 mg, Oral, Q8H, Donnamae Jude, RPH, 10 mg at 06/25/20 0500   loperamide (IMODIUM) capsule 2 mg, 2 mg, Oral, PRN, Alma Friendly, MD   MEDLINE mouth rinse, 15 mL, Mouth Rinse, q12n4p, Lars Mage T, MD, 15 mL at 06/23/20 1625   metoprolol succinate (TOPROL-XL) 24 hr tablet 25 mg, 25 mg, Oral, Daily, Shirley Friar, PA-C, 25 mg at 06/24/20 1006   nitroGLYCERIN (NITROSTAT) SL tablet 0.4 mg, 0.4 mg, Sublingual, Q5 Min x 3 PRN, Vickie Epley, MD   ondansetron [facility]) injection 4 mg, 4 mg, Intravenous, Q6H PRN, Lars Mage T, MD, 4 mg at 06/11/20 0133   sodium chloride flush (NS) 0.9 % injection 3 mL, 3 mL, Intravenous, Q12H, Vickie Epley, MD, 3 mL at 06/25/20 1005   spironolactone (ALDACTONE) tablet 25 mg, 25 mg, Oral, Daily, Donnamae Jude, RPH, 25 mg at 06/24/20 1005  Patients Current Diet:  Diet Order            Diet regular Room service appropriate? Yes with Assist; Fluid consistency: Thin  Diet effective now                 Precautions / Restrictions Precautions Precautions: Fall,ICD/Pacemaker Precaution Comments: [place] Restrictions Weight Bearing Restrictions: Yes LUE Weight Bearing: Non weight bearing (pacemaker) RLE Weight Bearing: Non  weight bearing   Has the patient had 2 or more falls or a fall with injury in the past year?No  Prior Activity Level Community (5-7x/wk): drives some. gets out of house almost everyday  Prior Functional Level Prior Function Level of Independence: Independent Comments: Works partime  Self Care: Did the patient need help bathing, dressing, using the toilet or eating?  Independent  Indoor Mobility: Did the patient need assistance with walking from room to room (with or without device)? Independent  Stairs: Did the patient need assistance with internal or external stairs (with or without device)? Independent  Functional Cognition: Did the patient need help planning regular tasks such as shopping or remembering to take medications? Needed some help  Home Assistive Devices / [place] Devices/Equipment: None Home Equipment: None  Prior Device Use: Indicate devices/aids used by the patient prior to current illness, exacerbation or injury? None of the above  Current Functional Level Cognition  Arousal/Alertness: Awake/alert Overall Cognitive Status: Impaired/Different from baseline Orientation Level: Oriented X4 Following Commands: Follows multi-step commands with increased time Safety/Judgement: Decreased awareness of safety,Decreased awareness of deficits General Comments: continues to need increased cuing for sequencing  Attention: Focused,Sustained Focused Attention: Appears intact Sustained Attention: Impaired Memory: Impaired Memory Impairment: Storage deficit,Retrieval deficit Awareness: Impaired Awareness Impairment: Emergent impairment    Extremity Assessment (includes Sensation/Coordination)  Upper Extremity Assessment: RUE deficits/detail RUE Deficits / Details: Pt presenting with decreased coorindation both fine and gross motor.  Pt unable to perform finger opposition. Poor and limited digit extension. Weak grasp strength. Limited gross motor  coorindation to bring arms above head. unable to isolate 2nd digit to perform finger to nose test. Unable to perform rapid alternating movements.  RUE Sensation:  ("It just feels different.") RUE Coordination: decreased fine motor,decreased gross motor  Lower Extremity Assessment: Defer to PT evaluation,RLE deficits/detail,LLE deficits/detail RLE Deficits / Details: During toe tapping, R foot with decreased coorindation and difficulty performing alteranting movement and synconized movement. generalized weakness, pt reports being ticklish however has jerky motion when feet and  LE are touched. During functional transfer, pt with poor coorindation and tendency to slide R foot. RLE Coordination: decreased fine motor LLE Deficits / Details: generalized weakness, pt reports being ticklish however has jerky motion when feet and LE are touched     ADLs  Overall ADL's : Needs assistance/impaired Eating/Feeding: Independent,Sitting Eating/Feeding Details (indicate cue type and reason): Patient able to eat lunch this date: regular diet with thin liquids. Grooming: Min guard,Standing Grooming Details (indicate cue type and reason): simulated with in room ambulation to/from b.room. declines need for acutal use but amb. in b.room and to recliner Upper Body Bathing: Supervision/ safety,Sitting Lower Body Bathing: Minimal assistance,Sit to/from stand Upper Body Dressing : Minimal assistance,Sitting Lower Body Dressing: Min guard,Sitting/lateral leans Lower Body Dressing Details (indicate cue type and reason): able to reach b les to adjust socks Toilet Transfer: Min Animal nutritionist Details (indicate cue type and reason): simulated via functionalmobilityu with RW, min guard fro safety Functional mobility during ADLs: Minimal assistance General ADL Comments: increased SOB noted however SpO2 96% post ambulation    Mobility  Overal bed mobility: Needs Assistance Bed Mobility: Sidelying to  Sit,Rolling Rolling: Min guard Sidelying to sit: HOB elevated,Min assist Supine to sit: HOB elevated,Min assist Sit to supine: Supervision,HOB elevated General bed mobility comments: cues to maintain precautions, physical assistance to bring trunk upright    Transfers  Overall transfer level: Needs assistance Equipment used: Rolling walker (2 wheeled) Transfers: Sit to/from [employer] Sit to Stand: Min guard Stand pivot transfers: Min guard General transfer comment: Cues for hand placement and not to push with L UE into standing.    Ambulation / Gait / Stairs / Wheelchair Mobility  Ambulation/Gait Ambulation/Gait assistance: Min assist,Min guard Social research officer, government (Feet): 450 Feet Assistive device: Rolling walker (2 wheeled) Gait Pattern/deviations: Decreased step length - left,Decreased step length - right,Trunk flexed,Step-through pattern,Shuffle General Gait Details: min A for steadying with RW, vc for proximity to RW and upright posture Gait velocity: slowed Gait velocity interpretation: <1.8 ft/sec, indicate of risk for recurrent falls Stairs: Yes Stairs assistance: Min assist Stair Management: One rail Right Number of Stairs: 5 General stair comments: Mild unsteadiness min assistance to maintain balance.    Posture / Balance Balance Overall balance assessment: Needs assistance Sitting-balance support: Feet supported,No upper extremity supported Sitting balance-Leahy Scale: Fair Standing balance support: During functional activity,Single extremity supported Standing balance-Leahy Scale: Poor Standing balance comment: reliant on external support    Special needs/care consideration Continuous Drip IV  heparin ADULT infusion 100 units/mL; 0.9% sodium chloride infusion, Life Vest prescribed after 05/04/20 hospitalization. Pt was non-compliant with wearing, Skin Right deep tissure pressure injury: buttocks and Designated visitor Taner Rzepka, wife     Previous  Environmental health practitioner (from acute therapy documentation) Living Arrangements: Spouse/significant other Available Help at Discharge: Family Type of Home: Mobile home (modular home) Home Layout: One level Home Access: Stairs to enter Entrance Stairs-Rails: Surveyor, mining of Steps: 4 Bathroom Shower/Tub: Optometrist: Yes How Accessible: Accessible via walker [place]: No  Discharge Living Setting Plans for Discharge Living Setting: Patient's home Type of Home at Discharge: Mobile home (modular home) Discharge Home Layout: One level Discharge Home Access: Stairs to enter Entrance Stairs-Rails: Right,Left Entrance Stairs-Number of Steps: 4 Discharge Bathroom Shower/Tub: [place] unit Discharge Bathroom Toilet: Standard Discharge Bathroom Accessibility: Yes How Accessible: Accessible via walker Does the patient have any problems obtaining your medications?: No  Social/Family/Support Systems Patient Roles: Spouse,Other (Comment) (  retired) Sport and exercise psychologist Information: wife: [name] [phone] Anticipated Caregiver: wife Anticipated Caregiver's Contact Information: see above Ability/Limitations of Caregiver: Supervision/Min A Caregiver Availability: 24/7 Discharge Plan Discussed with Primary Caregiver: Yes Is Caregiver In Agreement with Plan?: Yes Does Caregiver/Family have Issues with Lodging/Transportation while Pt is in Rehab?: No   Goals Patient/Family Goal for Rehab: Mod I: PT/OT/ST Expected length of stay: 7-10 days Pt/Family Agrees to Admission and willing to participate: Yes Program Orientation Provided & Reviewed with Pt/Caregiver Including Roles  & Responsibilities: Yes  Barriers to Discharge: Home environment access/layout  Barriers to Discharge Comments: steps to enter home.    Decrease burden of Care through IP rehab admission: NA   Possible need for SNF placement upon discharge:  NA   Patient Condition: This patient's medical and functional status has changed since the consult dated: 06/16/20 in which the Rehabilitation Physician determined and documented that the patient's condition is appropriate for intensive rehabilitative care in an inpatient rehabilitation facility. See "History of Present Illness" (above) for medical update. Functional changes are: Pt. Now ambulating min A/Min G. Patient's medical and functional status update has been discussed with the Rehabilitation physician and patient remains appropriate for inpatient rehabilitation. Will admit to inpatient rehab today.   Preadmission Screen Completed By: Raechel Ache, OT, with updates by Genella Mech, CCC-SLP, 06/25/2020 11:31 AM ______________________________________________________________________   Discussed status with Dr. Ranell Patrick on 07/04/2020 at 96 and received approval for admission today.  Admission Coordinator:  Genella Mech, time 11:33 /Date12/06/2020

## 2020-06-18 NOTE — Progress Notes (Signed)
  Discussed plan with Dr. Caryl Comes this am. Stable from HF standpoint. The HF team will sign off. We will arrange outpatient f/u.   Please call if we can assist.   Glori Bickers, MD  10:03 AM

## 2020-06-18 NOTE — Progress Notes (Signed)
Inpatient Rehab Admissions Coordinator:  Spoke with pt's wife, [name] via phone.  Explained CIR goals and expectations. She acknowledged understanding. She reported that she is available to assist pt if needed.  She is in agreement with pt pursuing CIR.  Will continue to follow.   Gayland Curry, [place], [place] Admissions Coordinator [phone]

## 2020-06-18 NOTE — Progress Notes (Signed)
PROGRESS NOTE    [name]  BDZ:[id number] DOB: Dec 04, 1955 DOA: 06/04/2020 PCP: [name], MD     Brief Narrative:  64 yo WM PMHx CAD w/ previous CABG in 2014, chronic combined systolic and diastolic HF/ ICM, HTN, HLD, CVA, and recent inferior STEMI 05/03/20 secondary to thrombotic occlusion of the SVG to the RCA, likely due to missed ticagrelor doses.  EF noted to be 25% with moderate RV to severe RV dysfunction and possible LV thrombus 05/04/2020.  He was discharged home on 10/23 on a LifeVest, but apparently not using.    On 11/21, he developed dizziness at church.  EMS found patient in wide complex tachycardia treated with amiodarone and lidocaine with successful conversion to NSR.  On arrival to ER, patient had recurrence of his VT but hemodynamically tolerating.  He was given additional amiodarone and lidocaine gtt started, however developed severe bradycardia requiring atropine and external pacing.  He was intubated and taken emergently for temporary transvenous pacemaker and coronary angiogram.  Returns to ICU sedated and intubated, PCCM consulted for further ventilator management.   Subjective: 12/5 afebrile overnight A/O x4, negative CP, negative S OB.   Assessment & Plan: Covid vaccination; unvaccinated   Active Problems:   Respiratory failure with hypoxia (HCC)   Ventricular tachycardia (HCC)   Cardiogenic shock (HCC)   Acute stroke due to ischemia [facility])   Acute pulmonary embolism without acute cor pulmonale (HCC)  Recurrent VT/bradycardia -Underlying sinus node dysfunction -EP plans to place ICD on Monday.  Was going to place on Friday however wanted [place] to look a little further into possible liver dysfunction.  Cardiogenic shock/chronic systolic and diastolic CHF with biventricular failure -CHF team seeing patient -Strict ins and out +3.7 L -Daily weight Filed Weights   06/16/20 0500 06/17/20 0349 06/18/20 0223  Weight: 87.7 kg 86.4 kg 86.6 kg   -Amiodarone 200 mg BID -Digoxin 0.125 mg daily -Entresto 97-103 mg 1 tablet BID -12/4 Hydralazine 10 mg TID -Spironolactone 25 mg daily -12/5 spoke with Dr. Virl Axe and patient tentatively scheduled for ICD placement on 12/6   CAD s/p CABG with recent inferior STEMI 04/2020 -See cardiogenic shock  HTN -See cardiogenic shock  HLD -Lipitor 80 mg daily   Acute respiratory failure with hypoxia/pleural effusion  -Acute respiratory failure resolved at least while in resting state. Patient has not seriously ambulated. -12/5 PCXR pending evaluate pleural effusion.   Bilateral lobar and segmental Pulmonary Embolism - incidental findings on stroke workup  - bilateral venous dopplers to evaluate for DVT pending - depending on neuro recommendations, will need anticoagulation- likely heparin gtt for now and eventual DOAC  Dysphagia  Extubated 11/23 - Continue to wean oxygen as tolerated - Pulmonary hygiene- IS, PT, moblize, CPT, NT prn  - aspiration precautions; continue cortrak for EN/ meds; SLP following  Nausea secondary to exaggerated gag response secondary to stroke and recent intubation Improved, remains NPO for pacemaker insertion - SLP to evaluate - Nasogastric tube for medications.  May need a period of nasogastric feeding.  Leukocytosis/ fever presumed HCAP - Complete 7 days of Cefepime 11/29; cultures negative/ normal flora; continue to monitor clinically  AKI Lab Results  Component Value Date   CREATININE 1.06 06/18/2020   CREATININE 1.11 06/17/2020   CREATININE 1.11 06/16/2020   CREATININE 1.05 06/15/2020   CREATININE 0.99 06/14/2020  - resolved  Encephalopathy , Acute CVA-Embolic L infarcts -Seen by Neurology  -serial neuro exams -Echocardiogram with bubble; negative for PFO see results below -  heparin gtt -follow up by stroke tream - delirium precautions -Resolved  Deconditioning - PT/OT  Elevated liver enzymes -Per patient and wife has  significant history within his family of liver disease, some of it EtOH related.  Patient does not drink. -Daily ammonia level -12/3 acute hepatitis panel negative -12/3 US abdomen liver; negative -12/4 slight elevation in liver enzymes, most likely secondary to shock liver, and starting high-dose statin.  Unfortunately stopping high-dose statin not an option given patient's cardiac status. -Believe it would be safe to place ICD.  Will discuss with EP   DVT prophylaxis: Heparin drip Code Status: Full Family Communication: 12/3 wife at bedside discussed plan of care answered all questions Status is: Inpatient    Dispo: The patient is from: Home              Anticipated d/c is to:??              Anticipated d/c date is: 93 / 7              Patient currently unstable      Consultants:  HF tm PCCM EP Neurology   Procedures/Significant Events:  11/21 Admitted cardiology/ intubated/ L/RHC, TVP, PA catheter 11/22  Remains on NE, amio, heparin gtt, decreased UOP.  CVP 8, CI 1.34, co-ox 60-66%, started on milrinone 11/23 Tmax 101.7, w/ increased respiratory secretions prompted the initiation of antibiotics. co-ox 72%, CVP 12, CI 3.2, SVR 1090. Able to titrate off sedation and decrease NE; extubated 14:22 then re-intubated at 18:38.    11/21 R/LHC >   Severe 3 vessel occlusive CAD. 2. Patent LIMA to the LAD 3. Patent SVG to OM1 4. Patent SVG to RCA. Much improved flow from prior PCI. 5. Mildly elevated LVEDP 17 mm Hg 6. Successful placement of temporary transvenous pacemaker.  11/24 TEE >> neg for LV thrombus; EF 30-35%.Inferolateral HK  11/30 CT head>> 1. No acute abnormality. Generalized atrophy 2. Hypodensity right posterior cerebellum most consistent with chronic infarct, not present in 2020. 3. ASPECTS is 10  11/30 CTA head/ neck/ perfusion >> 1. No emergent large vessel occlusion or infarct/ischemia by perfusion. 2. Lobar and segmental pulmonary emboli incidentally  noted in the upper lungs. 3. Minimal flow in the hypoplastic right vertebral artery. There is moderate to advanced atheromatous narrowing of the left V4 segment. 4. Carotid atherosclerosis without flow limiting stenosis or ulceration. 5. Suboptimal bolus.  11/30 CT head>>no acute findings 11/30 CTA head/neck>>no LVO 11/30 MRI brain>>Small acute infarcts involving the left frontal precentral gyrus and the juxtacortical left frontal white matter. Mild associated edema without mass effect 11/30 CT chest>>Bilateral multifocal pulmonary emboli the majority of which appear acute in nature. Some subacute to chronic thrombus is noted within the left lower lobe pulmonary artery. 12/1 echocardiogram with bubble study;Left Ventricle: Left ventricular ejection fraction, by estimation, is 25  to 30%. The left ventricle has severely decreased function.   Right Ventricle: The right ventricular size is mildly enlarged.   IAS/Shunts: Agitated saline contrast was given intravenously to evaluate  for intracardiac shunting. Agitated saline contrast bubble study was  negative, with no evidence of any interatrial shunt.    12/4 US abdomen RUQ; gallbladder sludge without evidence for acute cholecystitis  I have personally reviewed and interpreted all radiology studies and my findings are as above.  VENTILATOR SETTINGS:    Cultures 11/21 SARS2/ flu >>neg 11/21 MRSA PCR >>neg 11/22 BCx 2 >> neg 11/23 sputum >> normal flora 12/3 acute hepatitis panel  negative    Antimicrobials: Anti-infectives (From admission, onward)   Start     Ordered Stop   06/16/20 1030  gentamicin (GARAMYCIN) 80 mg in sodium chloride 0.9 % 500 mL irrigation        06/15/20 2108 06/17/20 1030   06/16/20 1030  ceFAZolin (ANCEF) IVPB 2g/100 mL premix        06/15/20 2108 06/17/20 1030   06/09/20 1045  ceFEPIme (MAXIPIME) 2 g in sodium chloride 0.9 % 100 mL IVPB        06/09/20 0948 06/13/20 0329   06/06/20 0800  vancomycin  (VANCOREADY) IVPB 750 mg/150 mL  Status:  Discontinued        06/06/20 0713 06/07/20 1201   06/06/20 0800  ceFEPIme (MAXIPIME) 2 g in sodium chloride 0.9 % 100 mL IVPB  Status:  Discontinued        06/06/20 0713 06/09/20 0948       Devices    LINES / TUBES:  11/21 ETT >> 11/23 11/21 R IJ cordis with PA catheter >> 11/21 R femoral TVP >> 11/21 R radial aline >>     Continuous Infusions: . sodium chloride 50 mL/hr at 06/17/20 0500  . sodium chloride    . feeding supplement (OSMOLITE 1.5 CAL) 1,000 mL (06/17/20 2014)  . heparin 1,550 Units/hr (06/18/20 0004)     Objective: Vitals:   06/18/20 0223 06/18/20 0519 06/18/20 0810 06/18/20 0900  BP: (!) 141/99 (!) 145/96 (!) 148/88   Pulse: 90  71 72  Resp: 20 19 18    Temp: 98.7 F (37.1 C) 98.9 F (37.2 C) 98 F (36.7 C)   TempSrc: Oral Oral Oral   SpO2: 98% 95% 94%   Weight: 86.6 kg     Height:        Intake/Output Summary (Last 24 hours) at 06/18/2020 1010 Last data filed at 06/18/2020 0867 Gross per 24 hour  Intake 2277.53 ml  Output 1100 ml  Net 1177.53 ml   Filed Weights   06/16/20 0500 06/17/20 0349 06/18/20 0223  Weight: 87.7 kg 86.4 kg 86.6 kg   Physical Exam:  General: A/O x4, No acute respiratory distress Eyes: negative scleral hemorrhage, negative anisocoria, negative icterus ENT: Negative Runny nose, negative gingival bleeding, Neck:  Negative scars, masses, torticollis, lymphadenopathy, JVD Lungs: Clear to auscultation bilaterally without wheezes or crackles Cardiovascular: Regular rate and rhythm without murmur gallop or rub normal S1 and S2 Abdomen: negative abdominal pain, nondistended, positive soft, bowel sounds, no rebound, no ascites, no appreciable mass Extremities: No significant cyanosis, clubbing, or edema bilateral lower extremities Skin: Negative rashes, lesions, ulcers Psychiatric:  Negative depression, negative anxiety, negative fatigue, negative mania  Central nervous system:   Cranial nerves II through XII intact, tongue/uvula midline, all extremities muscle strength 5/5, sensation intact throughout, negative dysarthria, negative expressive aphasia, negative receptive aphasia.  .     Data Reviewed: Care during the described time interval was provided by me .  I have reviewed this patient's available data, including medical history, events of note, physical examination, and all test results as part of my evaluation.  CBC: Recent Labs  Lab 06/12/20 0409 06/13/20 0500 06/14/20 0548 06/17/20 0058 06/18/20 0206  WBC 14.3* 14.5* 14.2* 16.5* 14.7*  NEUTROABS  --   --   --  11.3* 9.8*  HGB 13.8 14.8 14.4 14.5 14.1  HCT 42.3 44.7 41.5 43.9 42.9  MCV 93.6 92.0 88.7 90.7 92.3  PLT 264 313 363 475* 619*   Basic Metabolic  Panel: Recent Labs  Lab 06/13/20 0500 06/13/20 0500 06/13/20 1947 06/14/20 0548 06/15/20 0414 06/16/20 0125 06/16/20 0719 06/17/20 0058 06/18/20 0206  NA 141   < >  --  139 141  --  139 137 140  K 3.5   < >  --  3.3* 3.6  --  4.5 4.1 4.3  CL 104   < >  --  104 105  --  105 104 106  CO2 24   < >  --  23 23  --  23 22 23   GLUCOSE 120*   < >  --  127* 134*  --  108* 114* 112*  BUN 22   < >  --  19 20  --  20 17 20   CREATININE 1.13   < >  --  0.99 1.05  --  1.11 1.11 1.06  CALCIUM 8.5*   < >  --  8.3* 8.5*  --  8.9 8.8* 8.5*  MG 2.0   < > 1.8 1.9 2.1 2.0  --  1.9 1.9  PHOS 1.9*  --  2.0* 2.1*  --   --   --  3.8 4.0   < > = values in this interval not displayed.   GFR: Estimated Creatinine Clearance: 72 mL/min (by C-G formula based on SCr of 1.06 mg/dL). Liver Function Tests: Recent Labs  Lab 06/16/20 0719 06/17/20 0058 06/18/20 0206  AST 67* 64* 53*  ALT 82* 84* 83*  ALKPHOS 66 66 57  BILITOT 1.3* 1.5* 1.0  PROT 7.3 6.9 7.1  ALBUMIN 3.1* 3.0* 2.9*   No results for input(s): LIPASE, AMYLASE in the last 168 hours. Recent Labs  Lab 06/13/20 1124 06/16/20 0125 06/17/20 0058 06/18/20 0206  AMMONIA 46* 57* 40* 31    Coagulation Profile: No results for input(s): INR, PROTIME in the last 168 hours. Cardiac Enzymes: No results for input(s): CKTOTAL, CKMB, CKMBINDEX, TROPONINI in the last 168 hours. BNP (last 3 results) No results for input(s): PROBNP in the last 8760 hours. HbA1C: No results for input(s): HGBA1C in the last 72 hours. CBG: Recent Labs  Lab 06/17/20 1614 06/17/20 2004 06/17/20 2342 06/18/20 0353 06/18/20 0813  GLUCAP 107* 103* 147* 122* 126*   Lipid Profile: No results for input(s): CHOL, HDL, LDLCALC, TRIG, CHOLHDL, LDLDIRECT in the last 72 hours. Thyroid Function Tests: No results for input(s): TSH, T4TOTAL, FREET4, T3FREE, THYROIDAB in the last 72 hours. Anemia Panel: No results for input(s): VITAMINB12, FOLATE, FERRITIN, TIBC, IRON, RETICCTPCT in the last 72 hours. Sepsis Labs: No results for input(s): PROCALCITON, LATICACIDVEN in the last 168 hours.  Recent Results (from the past 240 hour(s))  Surgical PCR screen     Status: None   Collection Time: 06/15/20 10:50 PM   Specimen: Nasal Mucosa; Nasal Swab  Result Value Ref Range Status   MRSA, PCR NEGATIVE NEGATIVE Final   Staphylococcus aureus NEGATIVE NEGATIVE Final    Comment: (NOTE) The Xpert SA Assay (FDA approved for NASAL specimens in patients 51 years of age and older), is one component of a comprehensive surveillance program. It is not intended to diagnose infection nor to guide or monitor treatment. Performed at [facility], [place] [address] [place], [place] [address]          Radiology Studies: DG CHEST PORT 1 VIEW  Result Date: 06/18/2020 CLINICAL DATA:  Follow-up pleural effusion EXAM: PORTABLE CHEST 1 VIEW COMPARISON:  06/13/2020 FINDINGS: Cardiac shadow is within normal limits. Feeding catheter extends  into the stomach. Postsurgical changes are noted. The lungs are well aerated without focal infiltrate or effusion. No bony abnormality is seen. IMPRESSION: No acute abnormality noted.  Electronically Signed   By: Inez Catalina M.D.   On: 06/18/2020 08:13   US Abdomen Limited RUQ (LIVER/GB)  Result Date: 06/17/2020 CLINICAL DATA:  Elevated liver enzymes EXAM: ULTRASOUND ABDOMEN LIMITED RIGHT UPPER QUADRANT COMPARISON:  None. FINDINGS: Gallbladder: There is gallbladder sludge without evidence for cholelithiasis or acute cholecystitis. There is no gallbladder wall thickening. The gallbladder is distended. Common bile duct: Diameter: 3 mm Liver: No focal lesion identified. Within normal limits in parenchymal echogenicity. Portal vein is patent on color Doppler imaging with normal direction of blood flow towards the liver. Other: None. IMPRESSION: Gallbladder sludge without evidence for acute cholecystitis. Electronically Signed   By: Constance Holster M.D.   On: 06/17/2020 00:09        Scheduled Meds: .  stroke: mapping our early stages of recovery book   Does not apply Once  . amiodarone  200 mg Per Tube BID  . atorvastatin  80 mg Per Tube q1800  . chlorhexidine  15 mL Mouth Rinse BID  . Chlorhexidine Gluconate Cloth  6 each Topical Daily  . digoxin  0.125 mg Per Tube Daily  . escitalopram  10 mg Per Tube Daily  . ezetimibe  10 mg Per Tube Daily  . feeding supplement (PROSource TF)  45 mL Per Tube QID  . hydrALAZINE  10 mg Per Tube Q8H  . lactulose  20 g Per Tube TID  . mouth rinse  15 mL Mouth Rinse q12n4p  . sacubitril-valsartan  1 tablet Per Tube BID  . sodium chloride flush  3 mL Intravenous Q12H  . spironolactone  25 mg Per Tube Daily   Continuous Infusions: . sodium chloride 50 mL/hr at 06/17/20 0500  . sodium chloride    . feeding supplement (OSMOLITE 1.5 CAL) 1,000 mL (06/17/20 2014)  . heparin 1,550 Units/hr (06/18/20 0004)     LOS: 14 days    Time spent:40 min    Kento Gossman, Geraldo Docker, MD [facility] Pager [phone]  If 7PM-7AM, please contact night-coverage [email or web address] Password [email or web address] 06/18/2020, 10:10 AM

## 2020-06-19 ENCOUNTER — Encounter (HOSPITAL_COMMUNITY)
Admission: EM | Disposition: A | Discharge status: Rehabilitation Facility | Payer: Self-pay | Source: Home / Self Care | Attending: Cardiology

## 2020-06-19 DIAGNOSIS — R140 Abdominal distension (gaseous): Secondary | ICD-10-CM

## 2020-06-19 DIAGNOSIS — I255 Ischemic cardiomyopathy: Secondary | ICD-10-CM

## 2020-06-19 HISTORY — PX: ICD IMPLANT: EP1208

## 2020-06-19 LAB — COMPREHENSIVE METABOLIC PANEL
ALT: 74 U/L — ABNORMAL HIGH (ref 0–44)
AST: 44 U/L — ABNORMAL HIGH (ref 15–41)
Albumin: 3 g/dL — ABNORMAL LOW (ref 3.5–5.0)
Alkaline Phosphatase: 58 U/L (ref 38–126)
Anion gap: 10 (ref 5–15)
BUN: 20 mg/dL (ref 8–23)
CO2: 23 mmol/L (ref 22–32)
Calcium: 8.5 mg/dL — ABNORMAL LOW (ref 8.9–10.3)
Chloride: 104 mmol/L (ref 98–111)
Creatinine, Ser: 1.07 mg/dL (ref 0.61–1.24)
GFR, Estimated: >60 mL/min (ref >60)
Glucose, Bld: 116 mg/dL — ABNORMAL HIGH (ref 70–99)
Potassium: 4.2 mmol/L (ref 3.5–5.1)
Sodium: 137 mmol/L (ref 135–145)
Total Bilirubin: 0.9 mg/dL (ref 0.3–1.2)
Total Protein: 6.6 g/dL (ref 6.5–8.1)

## 2020-06-19 LAB — MAGNESIUM: Magnesium: 2.2 mg/dL (ref 1.7–2.4)

## 2020-06-19 LAB — HEPARIN LEVEL (UNFRACTIONATED): Heparin Unfractionated: <0.1 IU/mL — ABNORMAL LOW (ref 0.30–0.70)

## 2020-06-19 LAB — PHOSPHORUS: Phosphorus: 3.1 mg/dL (ref 2.5–4.6)

## 2020-06-19 LAB — GLUCOSE, CAPILLARY
Glucose-Capillary: 110 mg/dL — ABNORMAL HIGH (ref 70–99)
Glucose-Capillary: 136 mg/dL — ABNORMAL HIGH (ref 70–99)
Glucose-Capillary: 96 mg/dL (ref 70–99)
Glucose-Capillary: 104 mg/dL — ABNORMAL HIGH (ref 70–99)
Glucose-Capillary: 143 mg/dL — ABNORMAL HIGH (ref 70–99)

## 2020-06-19 LAB — AMMONIA: Ammonia: 38 umol/L — ABNORMAL HIGH (ref 9–35)

## 2020-06-19 SURGERY — ICD IMPLANT

## 2020-06-19 MED ORDER — SODIUM CHLORIDE 0.9 % IV SOLN
80.0000 mg | INTRAVENOUS | Status: AC
  Administered 2020-06-19: 80 mg

## 2020-06-19 MED ORDER — SODIUM CHLORIDE 0.9 % IV SOLN: INTRAVENOUS | Status: DC

## 2020-06-19 MED ORDER — ASPIRIN EC 81 MG PO TBEC
81.0000 mg | DELAYED_RELEASE_TABLET | Freq: Every day | ORAL | Status: DC
  Administered 2020-06-20 – 2020-06-25 (×6): 81 mg via ORAL
  Filled 2020-06-19 (×7): qty 1

## 2020-06-19 MED ORDER — ASPIRIN 81 MG PO CHEW: 81.0000 mg | CHEWABLE_TABLET | Freq: Every day | ORAL | Status: DC

## 2020-06-19 MED ORDER — HEPARIN (PORCINE) IN NACL 1000-0.9 UT/500ML-% IV SOLN
INTRAVENOUS | Status: DC | PRN
  Administered 2020-06-19: 500 mL

## 2020-06-19 MED ORDER — CEFAZOLIN SODIUM-DEXTROSE 2-4 GM/100ML-% IV SOLN
2.0000 g | INTRAVENOUS | Status: AC
  Administered 2020-06-19: 2 g via INTRAVENOUS

## 2020-06-19 MED ORDER — CHLORHEXIDINE GLUCONATE 4 % EX LIQD: 60.0000 mL | Freq: Once | CUTANEOUS | Status: DC

## 2020-06-19 MED ORDER — OSMOLITE 1.5 CAL PO LIQD
1000.0000 mL | ORAL | Status: DC
  Administered 2020-06-19: 1000 mL
  Filled 2020-06-19 (×2): qty 1000

## 2020-06-19 MED ORDER — LIDOCAINE HCL (PF) 1 % IJ SOLN
INTRAMUSCULAR | Status: DC | PRN
  Administered 2020-06-19: 60 mL

## 2020-06-19 MED ORDER — LACTULOSE 10 GM/15ML PO SOLN
10.0000 g | Freq: Two times a day (BID) | ORAL | Status: DC
  Administered 2020-06-19 – 2020-06-20 (×2): 10 g
  Filled 2020-06-19 (×2): qty 15

## 2020-06-19 SURGICAL SUPPLY — 9 items
CABLE SURGICAL S-101-97-12 (CABLE) ×3 IMPLANT
ICD GALLANT DR CDDRA500Q (ICD Generator) ×3 IMPLANT
LEAD DURATA 7122Q-65CM (Lead) ×3 IMPLANT
LEAD TENDRIL MRI 52CM LPA1200M (Lead) ×3 IMPLANT
MAT PREVALON FULL STRYKER (MISCELLANEOUS) ×3 IMPLANT
PAD PRO RADIOLUCENT 2001M-C (PAD) ×3 IMPLANT
SHEATH 7FR PRELUDE SNAP 13 (SHEATH) ×3 IMPLANT
SHEATH 8FR PRELUDE SNAP 13 (SHEATH) ×3 IMPLANT
TRAY PACEMAKER INSERTION (PACKS) ×3 IMPLANT

## 2020-06-19 NOTE — Progress Notes (Signed)
PROGRESS NOTE   [name]  BLT:[id number] DOB: October 22, 1955 DOA: 06/04/2020 PCP: [name], MD  Brief Narrative:    64 year old M CABG 0076 systolic diastolic heart failure EF 25% HTN HLD CVA, mild cognitive deficit recent inferior STEMI 05/03/2020 occlusion of RCA secondary to missed ticagrelor,?  LV thrombus [more likely false trabeculation]-discharged home 10/23 Developed dizziness at church found to have wide-complex tachycardia admitted by  Cardiology 06/04/20-converted with lidocaine but required external pacing because of severe bradycardia-was intubated taken for temporary transvenous PPM-restart Levophed with full vent support Cardiology consulted Right upper extremity weakness was found on 11/30 code stroke was called Patient had a small L MCA infarct in the setting of cardiomyopathy with an LV thrombus patient found to have acute PE From an LLL pulmonary artery-lower extremity duplex history of distal DVT   Timeline Admission 10/20-->05/06/2020 STEMI saphenous vein graft--right coronary-Echo 25-30%--echo?  Ventricular thrombus?  False tendon placed on LifeVest at time of discharge with 3 valve 90 days post vascularization Admission 03/23/2019-03/26/2019-STEMI-chest pain-intubated at that time but eventually sent home Admission 08/31/2012--09/06/2012 CABG X3 Admission 624--01/09/2011 infarction posterior limb internal capsule  Assessment & Plan:   Active Problems:   Respiratory failure with hypoxia (HCC)   Ventricular tachycardia (HCC)   Cardiogenic shock (HCC)   Acute stroke due to ischemia [facility])   Acute pulmonary embolism without acute cor pulmonale ([place])   1. Recurrent VT treated with lidocaine temporary pacing wire removed 11/27 a. Cardiology guiding management continue at this time amiodarone 200 twice daily for tube, digoxin 0.125 b. Planning for pacemaker implantation 12/6 2. Cardiogenic shock with biventricular failure a. Digoxin as above, Entresto 1 tab  twice daily, Aldactone 25 daily 3. CAD status post CABG 12/15/2012 with recent inferior STEMI 05/03/2020-06/04/2020 stable anatomy a. Continue aspirin 81 mg per tube 4. Bilateral lobar segmental pulmonary emboli a. Remains on heparin GTT until all procedures are performed b. Lower extremity duplex positive for DVT c. Once procedure performed likely can go to to [place] 5. Dysphagia a. Last cognitive SLP 12/3Mentation somewhat improved 50% b. Graduated to thin liquid regular diet on 12/3 however still has core track in place therefore have asked nursing to see how much he taken by mouth over the next day or so c. Monitor oxygen status 6. Recent presumed leukocytosis/HCAP a. Rx 7 days cefepime pending 11/29-graduated to diet b. Chest x-ray 12/5 is negative for any pneumonia 7. Acute CVA found on 11/30 a. Appreciate neurology who have signed off b. In 4-week follow-up with neurologist 8. HTN a. Additionally continuing hydralazine 10 mg every 8 9. HLD a. For secondary pre, Lipitor 80 vention continue Zetia 10 10. AKI  a. Admission-currently resolving 11. Transaminitis-patient does not drink alcohol-probably secondary to shock liver 12. Hepatitis panel performed 12/3 was negative a. Continue statin-defer to EP concerning placement of the same b. Continue lactulose but cut that dose from 20-10 twice daily given diarrhea c. Monitor trends in terms of mentation  DVT prophylaxis: IV heparin Code Status: Full Family Communication: none present called [phone] Disposition:  Status is: Inpatient  Remains inpatient appropriate because:Hemodynamically unstable, Persistent severe electrolyte disturbances, Altered mental status and Ongoing diagnostic testing needed not appropriate for outpatient work up   Dispo: The patient is from: Home              Anticipated d/c is to: SNF              Anticipated d/c date is: 3 days  Patient currently is not medically stable to d/c.     Consultants:   Cardiology  EEG  Neurology  Procedures:   Procedures/Significant Events:  11/21 Admitted cardiology/ intubated/ L/RHC, TVP, PA catheter 11/22 Remains on NE, amio, heparin gtt, decreased UOP. CVP 8, CI 1.34, co-ox 60-66%, started on milrinone 11/23 Tmax 101.7, w/ increased respiratory secretions prompted the initiation of antibiotics. co-ox 72%, CVP 12, CI 3.2, SVR 1090. Able to titrate off sedation and decrease NE; extubated 14:22 then re-intubated at 18:38.    11/21 [facility] >Severe 3 vessel occlusive CAD. 2. Patent LIMA to the LAD 3. Patent SVG to OM1 4. Patent SVG to RCA. Much improved flow from prior PCI. 5. Mildly elevated LVEDP 17 mm Hg 6. Successful placement of temporary transvenous pacemaker.  11/24 TEE >> neg for LV thrombus; EF 30-35%.Inferolateral HK  11/30 CT head>> 1. No acute abnormality. Generalized atrophy 2. Hypodensity right posterior cerebellum most consistent with chronic infarct, not present in 2020. 3. ASPECTS is 10  11/30 CTA head/ neck/ perfusion >> 1. No emergent large vessel occlusion or infarct/ischemia by perfusion. 2. Lobar and segmental pulmonary emboli incidentally noted in the upper lungs. 3. Minimal flow in the hypoplastic right vertebral artery. There is moderate to advanced atheromatous narrowing of the left V4 segment. 4. Carotid atherosclerosis without flow limiting stenosis or ulceration. 5. Suboptimal bolus.  11/30 CT head>>no acute findings 11/30 CTA head/neck>>no LVO 11/30 MRI brain>>Small acute infarcts involving the left frontal precentral gyrus and the juxtacortical left frontal white matter. Mild associated edema without mass effect 11/30 CT chest>>Bilateral multifocal pulmonary emboli the majority of which appear acute in nature. Some subacute to chronic thrombus is noted within the left lower lobe pulmonary artery. 12/1 echocardiogram with bubble study;Left Ventricle: Left ventricular ejection fraction,  by estimation, is 25  to 30%. The left ventricle has severely decreased function.   Right Ventricle: The right ventricular size is mildly enlarged.   IAS/Shunts: Agitated saline contrast was given intravenously to evaluate  for intracardiac shunting. Agitated saline contrast bubble study was  negative, with no evidence of any interatrial shunt.    12/4 US abdomen RUQ; gallbladder sludge without evidence for acute cholecystitis  Antimicrobials: None current   Subjective: Tells me he has had multiple loose stools Seen coherent and understands he is going for procedure at 11:00 today No chest pain no fever   Objective: Vitals:   06/18/20 1618 06/18/20 1951 06/18/20 2335 06/19/20 0359  BP: (!) 130/99 (!) 142/86 (!) 144/91 133/78  Pulse: 74 67  77  Resp: 18 20 20  (!) 21  Temp: 98.4 F (36.9 C) 98.4 F (36.9 C) 98.3 F (36.8 C) 98.7 F (37.1 C)  TempSrc: Oral Oral Oral Oral  SpO2: 95% 96%  94%  Weight:    86.9 kg  Height:        Intake/Output Summary (Last 24 hours) at 06/19/2020 0488 Last data filed at 06/19/2020 8916 Gross per 24 hour  Intake --  Output 2600 ml  Net -2600 ml   Filed Weights   06/17/20 0349 06/18/20 0223 06/19/20 0359  Weight: 86.4 kg 86.6 kg 86.9 kg    Examination: Awake coherent pleasant stated age pleasant no distress EOMI NCAT no focal deficit Chest clear no rales no rhonchi Power Doppler extremities 5/5 Reflexes deferred Smile symmetric NG tube in place Left sided IJ in place No lower extremity edema   Data Reviewed: I have personally reviewed following labs and imaging studies Sodium 137 potassium 4.2  BUNs/creatinine 20/1.07 AST 44 ALT 74 WBC 16.5 WBC 16.1-->14.7 Hemoglobin 14.1 Platelet 458  Radiology Studies: DG CHEST PORT 1 VIEW  Result Date: 06/18/2020 CLINICAL DATA:  Follow-up pleural effusion EXAM: PORTABLE CHEST 1 VIEW COMPARISON:  06/13/2020 FINDINGS: Cardiac shadow is within normal limits. Feeding catheter extends  into the stomach. Postsurgical changes are noted. The lungs are well aerated without focal infiltrate or effusion. No bony abnormality is seen. IMPRESSION: No acute abnormality noted. Electronically Signed   By: Inez Catalina M.D.   On: 06/18/2020 08:13     Scheduled Meds: .  stroke: mapping our early stages of recovery book   Does not apply Once  . amiodarone  200 mg Per Tube BID  . aspirin  81 mg Per Tube Daily  . atorvastatin  80 mg Per Tube q1800  . chlorhexidine  60 mL Topical Once  . chlorhexidine  15 mL Mouth Rinse BID  . Chlorhexidine Gluconate Cloth  6 each Topical Daily  . digoxin  0.125 mg Per Tube Daily  . escitalopram  10 mg Per Tube Daily  . ezetimibe  10 mg Per Tube Daily  . feeding supplement (PROSource TF)  45 mL Per Tube QID  . gentamicin irrigation  80 mg Irrigation On Call  . hydrALAZINE  10 mg Per Tube Q8H  . lactulose  20 g Per Tube TID  . mouth rinse  15 mL Mouth Rinse q12n4p  . sacubitril-valsartan  1 tablet Per Tube BID  . sodium chloride flush  3 mL Intravenous Q12H  . spironolactone  25 mg Per Tube Daily   Continuous Infusions: . sodium chloride 50 mL/hr at 06/17/20 0500  . sodium chloride    . feeding supplement (OSMOLITE 1.5 CAL) 1,000 mL (06/17/20 2014)  . heparin 1,550 Units/hr (06/18/20 1547)     LOS: 15 days    Time spent: [place], MD [facility] To contact the attending provider between 7A-7P or the covering provider during after hours 7P-7A, please log into the web site [email or web address] and access using universal [place] password for that web site. If you do not have the password, please call the hospital operator.  06/19/2020, 8:08 AM

## 2020-06-19 NOTE — TOC Initial Note (Signed)
Transition of Care (TOC) - Initial/Assessment Note  Marvetta Gibbons RN, BSN Transitions of Care Unit 4E- RN Case Manager See Treatment Team for direct phone #    Patient Details  Name: [name] MRN: [id number] Date of Birth: [birth year]  Transition of Care [facility]) CM/SW Contact:    Dawayne Patricia, RN Phone Number: 06/19/2020, 4:37 PM  Clinical Narrative:                 Admitted s/p VT arrest with PE and CVA. S/p ICD placement today- CIR has been consulted for possible admit- per L. Nunzio Cory with CIR they will start insurance auth today. Pt home with wife who reports she can assist post rehab stay.    Expected Discharge Plan: IP Rehab Facility Barriers to Discharge: Continued Medical Work up   Patient Goals and CMS Choice Patient states their goals for this hospitalization and ongoing recovery are:: rehab CMS Medicare.gov Compare Post Acute Care list provided to:: Patient Choice offered to / list presented to : Spouse, Patient  Expected Discharge Plan and Services Expected Discharge Plan: [place]   Discharge Planning Services: CM Consult Post Acute Care Choice: IP Rehab Living arrangements for the past 2 months: Single Family Home                                      Prior Living Arrangements/Services Living arrangements for the past 2 months: Single Family Home Lives with:: Spouse Patient language and need for interpreter reviewed:: Yes Do you feel safe going back to the place where you live?: Yes      Need for Family Participation in Patient Care: Yes (Comment) Care giver support system in place?: Yes (comment)   Criminal Activity/Legal Involvement Pertinent to Current Situation/Hospitalization: No - Comment as needed  Activities of Daily Living Home Assistive Devices/Equipment: None ADL Screening (condition at time of admission) Patient's cognitive ability adequate to safely complete daily activities?: Yes Is the patient deaf or have  difficulty hearing?: No Does the patient have difficulty seeing, even when wearing glasses/contacts?: No Does the patient have difficulty concentrating, remembering, or making decisions?: No Patient able to express need for assistance with ADLs?: No Does the patient have difficulty dressing or bathing?: No Independently performs ADLs?: Yes (appropriate for developmental age) Does the patient have difficulty walking or climbing stairs?: No Weakness of Legs: None Weakness of Arms/Hands: None  Permission Sought/Granted                  Emotional Assessment Appearance:: Appears stated age         Psych Involvement: No (comment)  Admission diagnosis:  Ventricular tachycardia ([place]) [I47.2] VT (ventricular tachycardia) ([place]) [I47.2] Patient Active Problem List   Diagnosis Date Noted  . Acute stroke due to ischemia ([place])   . Acute pulmonary embolism without acute cor pulmonale (HCC)   . Cardiogenic shock ([place])   . Ventricular tachycardia ([place]) 06/04/2020  . NSVT (nonsustained ventricular tachycardia) ([place]) 05/26/2020  . Heart attack ([place]) 05/04/2020  . Coronary artery disease involving coronary bypass graft of native heart without angina pectoris 06/25/2019  . Chronic combined systolic and diastolic heart failure ([place]) 06/25/2019  . Dilated aortic root ([place]) 06/25/2019  . CAD S/P urgent PCI 03/23/2019 04/22/2019  . Ischemic cardiomyopathy 04/22/2019  . Aortic root enlargement ([place]) 04/22/2019  . Stage 3a chronic kidney disease ([place]) 04/22/2019  . Hyperkalemia 04/22/2019  .  Respiratory failure with hypoxia ([place])   . Altered mental status 03/23/2019  . Status post CVA 02/03/2013  . Weakness 01/11/2013  . Dyslipidemia 01/11/2013  . Hx of CABG 09/02/2012  . ST elevation myocardial infarction (STEMI) ([place]) 09/01/2012  . Essential hypertension   . Prediabetes   . Hiatal hernia    PCP:  [name], MD Pharmacy:   [facility], [address] [place] [address] Phone: [phone] Fax: [phone]     Social Determinants of Health (SDOH) Interventions    Readmission Risk Interventions No flowsheet data found.

## 2020-06-19 NOTE — Progress Notes (Signed)
Orthopedic Tech Progress Note Patient Details:  Eulon Allnutt 10-Oct-1955 [id number] Patient already has sling Patient ID: [name], male   DOB: [birth year], 64 y.o.   MRN: [id number]   Braulio Bosch 06/19/2020, 4:06 PM

## 2020-06-19 NOTE — Progress Notes (Signed)
PT Cancellation Note  Patient Details Name: [name] MRN: [id number] DOB: April 22, 1956   Cancelled Treatment:    Reason Eval/Treat Not Completed: (P) Patient at procedure or test/unavailable (Pt off unit for ICD implant, will f/u per POC.)   Myrah Strawderman Eli Hose 06/19/2020, 1:25 PM  Erasmo Leventhal , PTA Acute Rehabilitation Services Pager [phone] Office [phone]

## 2020-06-19 NOTE — Progress Notes (Signed)
Electrophysiology Rounding Note  Patient Name: [name] Date of Encounter: 06/19/2020  Primary Cardiologist: Sanda Klein, MD Electrophysiologist: Dr. Quentin Ore   Subjective   Pt is feeling OK this am. No new complaints.   Anxious to work towards getting out of the hospital. Hopeful to be out by end of week.   Inpatient Medications    Scheduled Meds: .  stroke: mapping our early stages of recovery book   Does not apply Once  . amiodarone  200 mg Per Tube BID  . aspirin  81 mg Oral Daily  . atorvastatin  80 mg Per Tube q1800  . chlorhexidine  15 mL Mouth Rinse BID  . Chlorhexidine Gluconate Cloth  6 each Topical Daily  . digoxin  0.125 mg Per Tube Daily  . escitalopram  10 mg Per Tube Daily  . ezetimibe  10 mg Per Tube Daily  . feeding supplement (PROSource TF)  45 mL Per Tube QID  . hydrALAZINE  10 mg Per Tube Q8H  . lactulose  20 g Per Tube TID  . mouth rinse  15 mL Mouth Rinse q12n4p  . sacubitril-valsartan  1 tablet Per Tube BID  . sodium chloride flush  3 mL Intravenous Q12H  . spironolactone  25 mg Per Tube Daily   Continuous Infusions: . sodium chloride 50 mL/hr at 06/17/20 0500  . sodium chloride    . feeding supplement (OSMOLITE 1.5 CAL) 1,000 mL (06/17/20 2014)  . heparin 1,550 Units/hr (06/18/20 1547)   PRN Meds: acetaminophen (TYLENOL) oral liquid 160 mg/5 mL, albuterol, guaiFENesin, loperamide HCl, nitroGLYCERIN, ondansetron (ZOFRAN) IV, sodium chloride flush   Vital Signs    Vitals:   06/18/20 1618 06/18/20 1951 06/18/20 2335 06/19/20 0359  BP: (!) 130/99 (!) 142/86 (!) 144/91 133/78  Pulse: 74 67  77  Resp: 18 20 20  (!) 21  Temp: 98.4 F (36.9 C) 98.4 F (36.9 C) 98.3 F (36.8 C) 98.7 F (37.1 C)  TempSrc: Oral Oral Oral Oral  SpO2: 95% 96%  94%  Weight:    86.9 kg  Height:        Intake/Output Summary (Last 24 hours) at 06/19/2020 0708 Last data filed at 06/19/2020 4332 Gross per 24 hour  Intake --  Output 2600 ml  Net -2600 ml    Filed Weights   06/17/20 0349 06/18/20 0223 06/19/20 0359  Weight: 86.4 kg 86.6 kg 86.9 kg    Physical Exam    GEN- The patient is well appearing, alert and oriented x 3 today.   Head- normocephalic, atraumatic Eyes-  Sclera clear, conjunctiva pink Ears- hearing intact Oropharynx- clear Neck- supple Lungs- Clear to ausculation bilaterally, normal work of breathing Heart- Regular rate and rhythm, no murmurs, rubs or gallops GI- soft, NT, ND, + BS Extremities- no clubbing or cyanosis. No edema Skin- no rash or lesion Psych- euthymic mood, full affect Neuro- strength and sensation are intact  Labs    CBC Recent Labs    06/17/20 0058 06/18/20 0206  WBC 16.5* 14.7*  NEUTROABS 11.3* 9.8*  HGB 14.5 14.1  HCT 43.9 42.9  MCV 90.7 92.3  PLT 475* 951*   Basic Metabolic Panel Recent Labs    06/18/20 0206 06/19/20 0202  NA 140 137  K 4.3 4.2  CL 106 104  CO2 23 23  GLUCOSE 112* 116*  BUN 20 20  CREATININE 1.06 1.07  CALCIUM 8.5* 8.5*  MG 1.9 2.2  PHOS 4.0 3.1   Liver Function Tests Recent Labs  06/18/20 0206 06/19/20 0202  AST 53* 44*  ALT 83* 74*  ALKPHOS 57 58  BILITOT 1.0 0.9  PROT 7.1 6.6  ALBUMIN 2.9* 3.0*   No results for input(s): LIPASE, AMYLASE in the last 72 hours. Cardiac Enzymes No results for input(s): CKTOTAL, CKMB, CKMBINDEX, TROPONINI in the last 72 hours.   Telemetry    NSR 60-70s, occasional dropped beat/short pause (personally reviewed)  Radiology    DG CHEST PORT 1 VIEW  Result Date: 06/18/2020 CLINICAL DATA:  Follow-up pleural effusion EXAM: PORTABLE CHEST 1 VIEW COMPARISON:  06/13/2020 FINDINGS: Cardiac shadow is within normal limits. Feeding catheter extends into the stomach. Postsurgical changes are noted. The lungs are well aerated without focal infiltrate or effusion. No bony abnormality is seen. IMPRESSION: No acute abnormality noted. Electronically Signed   By: Inez Catalina M.D.   On: 06/18/2020 08:13    Patient  Profile     64 y.o. male with aPMH of CAD (s/p CABG in 2014 with LIMA-LAD, SVG-OM2 and SVG-RCA, s/p angioplasty to proximal SVG-RCA and DES to SVG-RCA anastomosisin 03/2019), chronic combined systolic and diastolic CHF/ICM (EF 81-19% by echo in 06/2019), HTN, HLD,prior CVAand recent STEMIwith angioplasty of the distal bypass graft10/21/21  [place] [place] found by EMS in [place]/VT treated with amio bolus' repeat bolus in ER >> asystole >> external pacing>> emergent cath/temp wire placement, intubated sedated.  Assessment & Plan    1. VTarrest -> asystole -emergent cath 06/04/20 with stable coronary anatomy -suspectscar mediatedVT -  amio has been transitioned to PO. Received lido in ER only.  - Keep K > 4.0 Mg > 2.0. K 4.2 and Mg 2.2 this am.  - no sustained bradycardia. Occasional pause. Temp wire is out (11/27)  NSVT, continue amio Plan for mexiletine and BB post implant as well Timing for ICD pending discussion with Dr. Quentin Ore this am.   2.Chronic systolic HF with biventricular failure ->cardiogenic shock  -Echo10/21/21 EF 25%. Moderate RV to severe RV dysfunction and possible LV thrombus. LifeVest placed. Previously d/c'd home on 05/06/20 -RHC this admission with preserved output and well compensated filling pressures.  Titrated off pressors.  On dig, entresto, aldactone. Appreciate HF team input.  Holding BB until post device.   3. CAD  - s/p CABG  - Inferior STEMI 05/03/20 s/p POBA SVG->PDA (felt 2/2 missed brilinta doses) - Emergent cath 11/21/21with stable coronary anatomy - on DAPT/statin/zetia Will not hold DAPT for implant given his hx nd recent angioplasty.  Heparin on this am. Will discuss timing with MD.   4. Possible LV thrombus on echo 05/04/20 TEE this admission with no thrombus  5. HTN Coreg post implant and titrate prn.   6. Acute hypoxic respiratory failure requiring intubation - In setting of cardiac  arrest - Extubated 11/23 - Re-Intubated 11/23 -> extubated 11/26 - Stable on RA currently.   7. Fever - Remains afebrile - BC x2 are neg for 5 days - Pneumonia (HCAP) completed treatment   8. AKI/CKD Cr 1.07 this am.   9. Delirium/stroke Resolved Appreciate neurology help > s/o   10. Pulm emb - Resp status remains stable on RA - Remains on heparin gtt (stroke protocol). Will discuss holding with MD re: ICD implantation.  - Will plan on resuming [place] 48 hrs post device   11. Dysphagia - Cleared for diet 06/14/20 by speech and had diet ordered yesterday - He is NPO this am for potential procedure.   Timing of ICD and  anticoagulation pending discussion with Dr. Quentin Ore. Note pending plans for CIR.   For questions or updates, please contact [place] Please consult [email or web address] for contact info under Cardiology/STEMI.  Signed, Shirley Friar, PA-C  06/19/2020, 7:08 AM

## 2020-06-19 NOTE — Progress Notes (Signed)
Pt arrived from  PACU. VSS.Will continue to monitor. Pt. Oriented to unit pt. Left arm in sling   Phoebe Sharps, RN

## 2020-06-19 NOTE — Care Management Important Message (Signed)
Important Message  Patient Details  Name: [name] MRN: [id number] Date of Birth: [birth year]   Medicare Important Message Given:  Yes     Shelda Altes 06/19/2020, 10:26 AM

## 2020-06-19 NOTE — Progress Notes (Signed)
SLP Cancellation Note  Patient Details Name: Kyre Jeffries MRN: [id number] DOB: May 08, 1956   Cancelled treatment:       Reason Eval/Treat Not Completed: Patient at procedure or test/unavailable (Pt off unit for ICD implant. SLP will f/u on subsequent date)  Janya Eveland I. Hardin Negus, [place], [place] Office number [phone] Pager [place] 06/19/2020, 1:30 PM

## 2020-06-20 ENCOUNTER — Inpatient Hospital Stay (HOSPITAL_COMMUNITY): Payer: Medicare HMO

## 2020-06-20 ENCOUNTER — Encounter (HOSPITAL_COMMUNITY): Payer: Self-pay | Admitting: Cardiology

## 2020-06-20 DIAGNOSIS — Z95810 Presence of automatic (implantable) cardiac defibrillator: Secondary | ICD-10-CM

## 2020-06-20 LAB — GLUCOSE, CAPILLARY
Glucose-Capillary: 124 mg/dL — ABNORMAL HIGH (ref 70–99)
Glucose-Capillary: 124 mg/dL — ABNORMAL HIGH (ref 70–99)
Glucose-Capillary: 129 mg/dL — ABNORMAL HIGH (ref 70–99)
Glucose-Capillary: 143 mg/dL — ABNORMAL HIGH (ref 70–99)
Glucose-Capillary: 150 mg/dL — ABNORMAL HIGH (ref 70–99)
Glucose-Capillary: 98 mg/dL (ref 70–99)

## 2020-06-20 LAB — CBC WITH DIFFERENTIAL/PLATELET
Abs Immature Granulocytes: 0.1 10*3/uL — ABNORMAL HIGH (ref 0.00–0.07)
Basophils Absolute: 0.1 10*3/uL (ref 0.0–0.1)
Basophils Relative: 1 %
Eosinophils Absolute: 0.2 10*3/uL (ref 0.0–0.5)
Eosinophils Relative: 2 %
HCT: 41 % (ref 39.0–52.0)
Hemoglobin: 14 g/dL (ref 13.0–17.0)
Immature Granulocytes: 1 %
Lymphocytes Relative: 17 %
Lymphs Abs: 2.6 10*3/uL (ref 0.7–4.0)
MCH: 31.3 pg (ref 26.0–34.0)
MCHC: 34.1 g/dL (ref 30.0–36.0)
MCV: 91.7 fL (ref 80.0–100.0)
Monocytes Absolute: 1.3 10*3/uL — ABNORMAL HIGH (ref 0.1–1.0)
Monocytes Relative: 8 %
Neutro Abs: 10.9 10*3/uL — ABNORMAL HIGH (ref 1.7–7.7)
Neutrophils Relative %: 71 %
Platelets: 499 10*3/uL — ABNORMAL HIGH (ref 150–400)
RBC: 4.47 MIL/uL (ref 4.22–5.81)
RDW: 13.4 % (ref 11.5–15.5)
WBC: 15.2 10*3/uL — ABNORMAL HIGH (ref 4.0–10.5)
nRBC: 0 % (ref 0.0–0.2)

## 2020-06-20 LAB — COMPREHENSIVE METABOLIC PANEL
ALT: 68 U/L — ABNORMAL HIGH (ref 0–44)
AST: 41 U/L (ref 15–41)
Albumin: 3 g/dL — ABNORMAL LOW (ref 3.5–5.0)
Alkaline Phosphatase: 64 U/L (ref 38–126)
Anion gap: 9 (ref 5–15)
BUN: 20 mg/dL (ref 8–23)
CO2: 25 mmol/L (ref 22–32)
Calcium: 8.7 mg/dL — ABNORMAL LOW (ref 8.9–10.3)
Chloride: 103 mmol/L (ref 98–111)
Creatinine, Ser: 1.12 mg/dL (ref 0.61–1.24)
GFR, Estimated: >60 mL/min (ref >60)
Glucose, Bld: 114 mg/dL — ABNORMAL HIGH (ref 70–99)
Potassium: 4.1 mmol/L (ref 3.5–5.1)
Sodium: 137 mmol/L (ref 135–145)
Total Bilirubin: 1 mg/dL (ref 0.3–1.2)
Total Protein: 6.8 g/dL (ref 6.5–8.1)

## 2020-06-20 LAB — MAGNESIUM: Magnesium: 2.1 mg/dL (ref 1.7–2.4)

## 2020-06-20 LAB — AMMONIA: Ammonia: 38 umol/L — ABNORMAL HIGH (ref 9–35)

## 2020-06-20 LAB — HEPARIN LEVEL (UNFRACTIONATED): Heparin Unfractionated: <0.1 IU/mL — ABNORMAL LOW (ref 0.30–0.70)

## 2020-06-20 LAB — PHOSPHORUS: Phosphorus: 3.6 mg/dL (ref 2.5–4.6)

## 2020-06-20 MED ORDER — APIXABAN 5 MG PO TABS: 10.0000 mg | ORAL_TABLET | Freq: Two times a day (BID) | ORAL | Status: DC

## 2020-06-20 MED ORDER — METOPROLOL SUCCINATE ER 25 MG PO TB24
25.0000 mg | ORAL_TABLET | Freq: Every day | ORAL | Status: DC
  Administered 2020-06-20 – 2020-06-24 (×5): 25 mg via ORAL
  Filled 2020-06-20 (×6): qty 1

## 2020-06-20 MED ORDER — ENSURE ENLIVE PO LIQD
237.0000 mL | Freq: Three times a day (TID) | ORAL | Status: DC
  Administered 2020-06-20 – 2020-06-22 (×6): 237 mL via ORAL
  Filled 2020-06-20 (×2): qty 237

## 2020-06-20 MED ORDER — OSMOLITE 1.5 CAL PO LIQD
1000.0000 mL | ORAL | Status: DC
  Administered 2020-06-20 – 2020-06-21 (×3): 1000 mL
  Filled 2020-06-20 (×5): qty 1000

## 2020-06-20 MED ORDER — APIXABAN 5 MG PO TABS
5.0000 mg | ORAL_TABLET | Freq: Two times a day (BID) | ORAL | Status: DC
  Administered 2020-06-20 – 2020-06-25 (×10): 5 mg via ORAL
  Filled 2020-06-20 (×10): qty 1

## 2020-06-20 MED ORDER — APIXABAN 5 MG PO TABS: 5.0000 mg | ORAL_TABLET | Freq: Two times a day (BID) | ORAL | Status: DC

## 2020-06-20 NOTE — Progress Notes (Signed)
  Speech Language Pathology Treatment: Cognitive-Linquistic;Dysphagia  Patient Details Name: [name] MRN: [id number] DOB: 12-14-1955 Today's Date: 06/20/2020 Time: 1445-1500 SLP Time Calculation (min) (ACUTE ONLY): 15 min  Assessment / Plan / Recommendation Clinical Impression  Pt making excellent progress with cognition. Oriented x3. Demonstrates much improved recall.  Able to find phone number for food service in menu, retain number in working memory and dial number accurately with initial cue only for recall.  After food ambassador arrived to take further meal orders, pt recalled 4/6 items independently. He is drinking thin liquids, eating regular solids safely with no concern for airway compromise. Continues with calorie count to ensure adequate intake prior to D/Cing cortrak.  Suspect its removal and improved comfort might help with intake. SLP will continue to follow.   HPI HPI: 73 yoM with extensive cardiac history with recent STEMI 04/2020, D/Cd home on 10/23 with Lifevest, per notes was not using. Admitted 11/21 with VT arrest, Acute on Chronic systolic HF with biventricular failure ->cardiogenic shock . Intubated and emergently taken to cath lab for temporary transvenous pacemaker and coronary angiogram.  Extubated 11/23 then reintubated four hours later, extubated 11/26.        SLP Plan  Continue with current plan of care       Recommendations  Diet recommendations: Regular;Thin liquid Liquids provided via: Cup;Straw Medication Administration: Whole meds with liquid Supervision: Patient able to self feed Postural Changes and/or Swallow Maneuvers: Seated upright 90 degrees                Oral Care Recommendations: Oral care BID Follow up Recommendations: Inpatient Rehab SLP Visit Diagnosis: Dysphagia, pharyngeal phase (R13.13) Plan: Continue with current plan of care       GO                Juan Quam Laurice 06/20/2020, 3:05 PM  Solymar Grace L.  Tivis Ringer, [place] Office number [phone] Pager [phone]

## 2020-06-20 NOTE — TOC Benefit Eligibility Note (Signed)
Transition of Care [facility]) Benefit Eligibility Note    Patient Details  Name: [name] MRN: [id number] Date of Birth: 1955/12/10   Medication/Dose: 1. Eliquis / 2. Xarelto  Covered?: Yes     Prescription Coverage Preferred Pharmacy: any retail pharmacy  Spoke with Person/Company/Phone Number:: [employer]  Co-Pay: $161.70 for eliquis (30 day retail)/ $219.90 for Xarelto (30 day retail)  Prior Approval: No     Additional Notes: patient is in coverage gap    Delorse Lek Phone Number: 06/20/2020, 12:23 PM

## 2020-06-20 NOTE — Progress Notes (Signed)
PROGRESS NOTE   [name]  IFO:[id number] DOB: January 01, 1956 DOA: 06/04/2020 PCP: [name], MD  Brief Narrative:    64 year old M CABG 6767 systolic diastolic heart failure EF 25% HTN HLD CVA, mild cognitive deficit recent inferior STEMI 05/03/2020 occlusion of RCA secondary to missed ticagrelor,?  LV thrombus [more likely false trabeculation]-discharged home 10/23 Developed dizziness at church found to have wide-complex tachycardia admitted by  Cardiology 06/04/20-converted with lidocaine but required external pacing because of severe bradycardia-was intubated taken for temporary transvenous PPM-restart Levophed with full vent support Cardiology consulted Right upper extremity weakness was found on 11/30 code stroke was called and patient was placed on IV heparin Patient had a small L MCA infarct in the setting of cardiomyopathy with an LV thrombus  Found also to have pulmonary embolism-lower extremity duplex was negative for  distal DVT   Timeline Admission 10/20-->05/06/2020 STEMI saphenous vein graft--right coronary-Echo 25-30%--echo?  Ventricular thrombus?  False tendon placed on LifeVest at time of discharge with 3 valve 90 days post vascularization Admission 03/23/2019-03/26/2019-STEMI-chest pain-intubated at that time but eventually sent home Admission 08/31/2012--09/06/2012 CABG X3 Admission 624--01/09/2011 infarction posterior limb internal capsule  Assessment & Plan:   Active Problems:   Respiratory failure with hypoxia (HCC)   Ventricular tachycardia (HCC)   Cardiogenic shock (HCC)   Acute stroke due to ischemia [facility])   Acute pulmonary embolism without acute cor pulmonale ([place])   1. Recurrent VT treated with lidocaine temporary pacing wire removed 11/27 a. Cardiology recommends continuation amiodarone 200 twice daily for tube, digoxin 0.125 defer titration or weaning of medications to them---- b. Started Toprol 25 mg 06/20/2020 c. Saint Jude ICD dual-chamber  pacemaker placed 06/19/2020 2. Cardiogenic shock with biventricular failure a. Digoxin as above, Entresto 1 tab twice daily, Aldactone 25 daily 3. CAD status post CABG 12/15/2012 with recent inferior STEMI 05/03/2020-06/04/2020 stable anatomy a. Continue aspirin 81 mg per tube 4. Bilateral lobar segmental pulmonary emboli a. On heparin since stroke PE diagnosis-transitioning to Eliquis 10 twice daily 7 days then 5 twice daily subsequently potentially lifelong b. Lower extremity duplex positive for DVT 5. Dysphagia a. Last cognitive SLP 12/3 Mentation somewhat improved 50% b. Graduated to thin liquid regular diet on 12/3 however still has core track in place cut back feeds SLP to monitor c. Allow regular diet --- nursing has been informed with regards to the same  d. may be remove core track if calorie counts are reasonable over the next several days e. Monitor oxygen status 6. Recent presumed leukocytosis/HCAP a. Rx 7 days cefepime pending 11/29-graduated to diet b. Chest x-ray 12/5 is negative for any pneumonia 7. Acute CVA found on 11/30 a. Appreciate neurology who have signed off b. In 4-week follow-up with neurologist 8. HTN a. Additionally continuing hydralazine 10 mg every 8 9. HLD a. For secondary pre, Lipitor 80 vention continue Zetia 10 10. AKI  a. Admission-currently resolving- b. Labs continue to look relatively normal 11. Transaminitis-patient does not drink alcohol-probably secondary to shock liver 12. Hepatitis panel performed 12/3 was negative 13. ???  Hepatic encephalopathy complicated by diarrhea from lactulose a. Continue statin-defer to EP concerning placement of the same b. We will completely discontinue lactulose 12/7 as still having diarrhea and mental status is stable-if becomes confused may require reinitiation c. Monitor trends in terms of mentation  DVT prophylaxis: IV heparin transitioning to Xarelto Code Status: Full Family Communication: none present  called wife lisa [phone], updated fully Disposition:  Status is: Inpatient  Remains inpatient  appropriate because:Hemodynamically unstable, Persistent severe electrolyte disturbances, Altered mental status and Ongoing diagnostic testing needed not appropriate for outpatient work up   Dispo: The patient is from: Home              Anticipated d/c is to: CIR              Anticipated d/c date is: 2 days              Patient currently is not medically stable to d/c.    Consultants:   Cardiology  EEG  Neurology  Procedures/Significant Events:  11/21 Admitted cardiology/ intubated/ L/RHC, TVP, PA catheter 11/22 Remains on NE, amio, heparin gtt, decreased UOP. CVP 8, CI 1.34, co-ox 60-66%, started on milrinone 11/23 Tmax 101.7, w/ increased respiratory secretions prompted the initiation of antibiotics. co-ox 72%, CVP 12, CI 3.2, SVR 1090. Able to titrate off sedation and decrease NE; extubated 14:22 then re-intubated at 18:38.    11/21 [facility] >Severe 3 vessel occlusive CAD. 2. Patent LIMA to the LAD 3. Patent SVG to OM1 4. Patent SVG to RCA. Much improved flow from prior PCI. 5. Mildly elevated LVEDP 17 mm Hg 6. Successful placement of temporary transvenous pacemaker.  11/24 TEE >> neg for LV thrombus; EF 30-35%.Inferolateral HK  11/30 CT head>> 1. No acute abnormality. Generalized atrophy 2. Hypodensity right posterior cerebellum most consistent with chronic infarct, not present in 2020. 3. ASPECTS is 10  11/30 CTA head/ neck/ perfusion >> 1. No emergent large vessel occlusion or infarct/ischemia by perfusion. 2. Lobar and segmental pulmonary emboli incidentally noted in the upper lungs. 3. Minimal flow in the hypoplastic right vertebral artery. There is moderate to advanced atheromatous narrowing of the left V4 segment. 4. Carotid atherosclerosis without flow limiting stenosis or ulceration. 5. Suboptimal bolus.  11/30 CT head>>no acute findings 11/30 CTA  head/neck>>no LVO 11/30 MRI brain>>Small acute infarcts involving the left frontal precentral gyrus and the juxtacortical left frontal white matter. Mild associated edema without mass effect 11/30 CT chest>>Bilateral multifocal pulmonary emboli the majority of which appear acute in nature. Some subacute to chronic thrombus is noted within the left lower lobe pulmonary artery. 12/1 echocardiogram with bubble study;Left Ventricle: Left ventricular ejection fraction, by estimation, is 25  to 30%. The left ventricle has severely decreased function.   Right Ventricle: The right ventricular size is mildly enlarged.   IAS/Shunts: Agitated saline contrast was given intravenously to evaluate  for intracardiac shunting. Agitated saline contrast bubble study was  negative, with no evidence of any interatrial shunt.    12/4 US abdomen RUQ; gallbladder sludge without evidence for acute cholecystitis  06/19/2020-Saint Jude pacemaker placement dual-chamber  Antimicrobials: None current   Subjective:  He is awake and alert but I get the sense he does not really understand much of what I am trying to explain to him He complains of mild chest soreness no fever no chills He has no nausea or vomiting Despite my order for him to wean off of TPN and get a diet he did not order breakfast today-I have alerted nurse tech to help him with the same  Objective: Vitals:   06/20/20 0401 06/20/20 0549 06/20/20 0749 06/20/20 0902  BP: 132/75  130/89   Pulse: 77  73 65  Resp: 20 (!) 25 20   Temp: 98.8 F (37.1 C)  98.5 F (36.9 C)   TempSrc: Oral  Oral   SpO2: 98%  94%   Weight:  84.5 kg  Height:        Intake/Output Summary (Last 24 hours) at 06/20/2020 1050 Last data filed at 06/20/2020 1610 Gross per 24 hour  Intake 4585.65 ml  Output 900 ml  Net 3685.65 ml   Filed Weights   06/18/20 0223 06/19/20 0359 06/20/20 0549  Weight: 86.6 kg 86.9 kg 84.5 kg    Examination: Awake coherent oriented x3  but does not seem to completely understand complexity of his illness S1-S2 no murmur sinus rhythm-now has PPM in place left side with the sling on left arm Abdomen soft no rebound no guarding No lower extremity edema ROM intact bilaterally   Data Reviewed: I have personally reviewed following labs and imaging studies  Sodium 137 BUNs/creatinine 20/1.1 AST/ALT 41/68 Bilirubin 1.0  Platelet 499 White count 15.2   Radiology Studies: DG Chest 2 View  Result Date: 06/20/2020 CLINICAL DATA:  AICD. EXAM: CHEST - 2 VIEW COMPARISON:  Chest x-ray 06/18/2020. FINDINGS: Feeding tube noted with tip over the stomach. AICD noted with lead tips over the right atrium and right ventricle. Prior CABG. Heart size normal. Low lung volumes. No focal infiltrate. No pleural effusion or pneumothorax. IMPRESSION: 1. Feeding tube noted with tip over the stomach. 2. AICD noted with lead tips over the right atrium and right ventricle. Prior CABG. Heart size normal. 3. No acute pulmonary disease.  No pneumothorax. Electronically Signed   By: Marcello Moores  Register   On: 06/20/2020 06:45     Scheduled Meds: .  stroke: mapping our early stages of recovery book   Does not apply Once  . amiodarone  200 mg Per Tube BID  . apixaban  10 mg Oral BID   Followed by  . [START ON 06/27/2020] apixaban  5 mg Oral BID  . aspirin EC  81 mg Oral Daily  . atorvastatin  80 mg Per Tube q1800  . chlorhexidine  15 mL Mouth Rinse BID  . Chlorhexidine Gluconate Cloth  6 each Topical Daily  . digoxin  0.125 mg Per Tube Daily  . escitalopram  10 mg Per Tube Daily  . ezetimibe  10 mg Per Tube Daily  . feeding supplement (PROSource TF)  45 mL Per Tube QID  . hydrALAZINE  10 mg Per Tube Q8H  . mouth rinse  15 mL Mouth Rinse q12n4p  . metoprolol succinate  25 mg Oral Daily  . sacubitril-valsartan  1 tablet Per Tube BID  . sodium chloride flush  3 mL Intravenous Q12H  . spironolactone  25 mg Per Tube Daily   Continuous Infusions: .  feeding supplement (OSMOLITE 1.5 CAL) 75 mL/hr at 06/19/20 1900     LOS: 16 days    Time spent: [place], MD [facility] To contact the attending provider between 7A-7P or the covering provider during after hours 7P-7A, please log into the web site [email or web address] and access using universal [place] password for that web site. If you do not have the password, please call the hospital operator.  06/20/2020, 10:50 AM

## 2020-06-20 NOTE — Progress Notes (Signed)
Inpatient Rehabilitation-Admissions [employer] authorization process initiated. Will update once there has been a determination.   Raechel Ache, OTR/L  Rehab Admissions Coordinator  [phone] 06/20/2020 3:19 PM

## 2020-06-20 NOTE — Discharge Instructions (Addendum)
Supplemental Discharge Instructions for  Pacemaker/Defibrillator Patients   Activity No heavy lifting or vigorous activity with your left/right arm for 6 to 8 weeks.  Do not raise your left/right arm above your head for one week.  Gradually raise your affected arm as drawn below.             06/20/2020                06/21/2020                 06/22/2020               06/23/2020 __  NO DRIVING for 6 months.  WOUND CARE - Keep the wound area clean and dry.  Do not get this area wet , no showers for one week; you may shower on  06/23/2020   . The tape/steri-strips on your wound will fall off; do not pull them off.  No bandage is needed on the site.  DO  NOT apply any creams, oils, or ointments to the wound area. - If you notice any drainage or discharge from the wound, any swelling or bruising at the site, or you develop a fever > 101? F after you are discharged home, call the office at once.  Special Instructions - You are still able to use cellular telephones; use the ear opposite the side where you have your pacemaker/defibrillator.  Avoid carrying your cellular phone near your device. - When traveling through airports, show security personnel your identification card to avoid being screened in the metal detectors.  Ask the security personnel to use the hand wand. - Avoid arc welding equipment, MRI testing (magnetic resonance imaging), TENS units (transcutaneous nerve stimulators).  Call the office for questions about other devices. - Avoid electrical appliances that are in poor condition or are not properly grounded. - Microwave ovens are safe to be near or to operate.  Additional information for defibrillator patients should your device go off: - If your device goes off ONCE and you feel fine afterward, notify the device clinic nurses. - If your device goes off ONCE and you do not feel well afterward, call 911. - If your device goes off TWICE, call 911. - If your device goes off  THREE times in one day, call 911.  DO NOT DRIVE YOURSELF OR A FAMILY MEMBER WITH A DEFIBRILLATOR TO THE HOSPITAL--CALL 911.  ----------------------------------- Information on my medicine - ELIQUIS (apixaban)  Why was Eliquis prescribed for you? Eliquis was prescribed to treat blood clots that may have been found in the veins of your legs (deep vein thrombosis) or in your lungs (pulmonary embolism) and to reduce the risk of them occurring again.  What do You need to know about Eliquis ? The dose is ONE 5 mg tablet taken TWICE daily.  Eliquis may be taken with or without food.   Try to take the dose about the same time in the morning and in the evening. If you have difficulty swallowing the tablet whole please discuss with your pharmacist how to take the medication safely.  Take Eliquis exactly as prescribed and DO NOT stop taking Eliquis without talking to the doctor who prescribed the medication.  Stopping may increase your risk of developing a new blood clot.  Refill your prescription before you run out.  After discharge, you should have regular check-up appointments with your healthcare provider that is prescribing your Eliquis.    What do you do if you  miss a dose? If a dose of ELIQUIS is not taken at the scheduled time, take it as soon as possible on the same day and twice-daily administration should be resumed. The dose should not be doubled to make up for a missed dose.  Important Safety Information A possible side effect of Eliquis is bleeding. You should call your healthcare provider right away if you experience any of the following: ? Bleeding from an injury or your nose that does not stop. ? Unusual colored urine (red or dark brown) or unusual colored stools (red or black). ? Unusual bruising for unknown reasons. ? A serious fall or if you hit your head (even if there is no bleeding).  Some medicines may interact with Eliquis and might increase your risk of  bleeding or clotting while on Eliquis. To help avoid this, consult your healthcare provider or pharmacist prior to using any new prescription or non-prescription medications, including herbals, vitamins, non-steroidal anti-inflammatory drugs (NSAIDs) and supplements.  This website has more information on Eliquis (apixaban): http://www.eliquis.com/eliquis/home   Plan for aspirin, plavix, and apixaban for 1 month - then apixaban and plavix. Follow up with physician at 3 month mark of apixaban to see if continuing therapy or stop and restart aspirin per cardiology.

## 2020-06-20 NOTE — Progress Notes (Addendum)
ANTICOAGULATION CONSULT NOTE  Pharmacy Consult for Heparin> apixaban Indication: pulmonary embolus and DVTs  No Known Allergies  Patient Measurements: Height: 5\' 7"  (170.2 cm) Weight: 84.5 kg (186 lb 3.2 oz) IBW/kg (Calculated) : 66.1 Heparin Dosing Weight: 86kg  Vital Signs: Temp: 98.5 F (36.9 C) (12/07 0749) Temp Source: Oral (12/07 0749) BP: 130/89 (12/07 0749) Pulse Rate: 65 (12/07 0902)  Labs: Recent Labs    06/18/20 0206 06/19/20 0202 06/20/20 0056  HGB 14.1  --  14.0  HCT 42.9  --  41.0  PLT 458*  --  499*  HEPARINUNFRC 0.33 <0.10* <0.10*  CREATININE 1.06 1.07 1.12    Estimated Creatinine Clearance: 67.4 mL/min (by C-G formula based on SCr of 1.12 mg/dL).   Assessment: 64 yo M admitted with VT and concern for ACS. Cath clean and heparin resumed for presumed LV thrombus. Repeat TEE 11/24 showed no thrombus so heparin discontinued. Patient noted to have acute CVA 11/30 am, CT angio demonstrated incidental finding of PE.  Dopplers also + bilateral DVTs.  Neurology cleared for stroke protocol heparin. Heparin was discontinued s/p ICD on 12/6 with plans to start apixaban tonight -Hg= 14 -cost of apixaban is ~ $160/month   Goal of Therapy:  Heparin level 0.3-0.5 units/ml Monitor platelets by anticoagulation protocol: Yes   Plan:  -Start apixaban 10mg  po bid tonight for 7 days then 5mg  po bid -Will provide patient education and looking into patient assistance  Hildred Laser, PharmD Clinical Pharmacist **Pharmacist phone directory can now be found on [email or web address] ([email or web address]).  Listed under [place].

## 2020-06-20 NOTE — Progress Notes (Signed)
Physical Therapy Treatment Patient Details Name: [name] MRN: [id number] DOB: 18-Oct-1955 Today's Date: 06/20/2020    History of Present Illness 64 yo with a hx as stated above who presented to [facility] 06/04/20 after having a pre-syncopal symptoms including dizzinezss while at church earlier today found to be in VT (not wearing Life Vest as prescribed on recent hospital discharge) on EMS arrival. Has not been compliant with LifeVest stating the battery pack too bulky. PMH of CAD (s/p CABG in 2014 with LIMA-LAD, SVG-OM2 and SVG-RCA, s/p angioplasty to proximal SVG-RCA and DES to SVG-RCA anastomosis in 03/2019), chronic combined systolic and diastolic CHF/ICM (EF 27-74% by echo in 06/2019), HTN, HLD, prior CVA and recent STEMI with angioplasty of the distal bypass graft 05/04/20. Admitted 06/04/20 for treatment of wide complex tachycardia. Intubated 11/21-Extubated 11/23 and reintubated extubated 11/26 R radial aline 11/23 TEE 11/24: EF 30-35% no evidence of LV clot.  11/30 pt noted to have increased R sided weakness by OT, code stroke called MRI revealed Small acute infarcts involving the left frontal precentral gyrus, ICD placed 06/19/20    PT Comments    Pt is making good progress towards his goals today. Pt continues to have less strength and coordination on his R sided, however it has improved since this PT saw him last week. Pt continues to be limited by decreased awareness of his deficits and related safety awareness. Pt is able to teach back ICD precautions. PT left sling on L UE to reinforce decreased L UE flexion given decreased safety awareness. Pt is min guard for bed mobility and transfers and min A for ambulation with RW. Pt requires cuing for speed and proximity to RW to maintain safety. D/c plans remain appropriate to work on coordination and safety with mobility. PT will continue to follow acutely.    Follow Up Recommendations  CIR     Equipment Recommendations  Rolling walker with  5" wheels       Precautions / Restrictions Precautions Precautions: Fall;ICD/Pacemaker Precaution Comments: Cortrak Required Braces or Orthoses: Sling (to reduce LUE movement) Restrictions Weight Bearing Restrictions: No    Mobility  Bed Mobility Overal bed mobility: Needs Assistance Bed Mobility: Sidelying to Sit;Rolling Rolling: Min guard Sidelying to sit: HOB elevated;Min guard       General bed mobility comments: min guard for rolling to R and for safety with use of bedrail to pushup to sitting with R UE  Transfers Overall transfer level: Needs assistance Equipment used: Rolling walker (2 wheeled) Transfers: Sit to/from Stand Sit to Stand: Min guard         General transfer comment: min guard for safety   Ambulation/Gait Ambulation/Gait assistance: Min assist Gait Distance (Feet): 470 Feet Assistive device: Rolling walker (2 wheeled) Gait Pattern/deviations: Decreased step length - left;Decreased step length - right;Trunk flexed;Step-through pattern;Shuffle Gait velocity: too fast for condition  Gait velocity interpretation: <1.8 ft/sec, indicate of risk for recurrent falls General Gait Details: min A for steadying with RW, vc for slowing pace for safety, to promote good proximity and upright posture    Modified Rankin (Stroke Patients Only) Modified Rankin (Stroke Patients Only) Pre-Morbid Rankin Score: No symptoms Modified Rankin: Moderately severe disability     Balance Overall balance assessment: Needs assistance Sitting-balance support: Feet supported;No upper extremity supported Sitting balance-Leahy Scale: Fair     Standing balance support: During functional activity;Bilateral upper extremity supported;Single extremity supported Standing balance-Leahy Scale: Poor Standing balance comment: reliant on external support  Cognition Arousal/Alertness: Awake/alert Behavior During Therapy: WFL for tasks  assessed/performed Overall Cognitive Status: Impaired/Different from baseline Area of Impairment: Following commands;Awareness;Problem solving;Safety/judgement                     Memory: Decreased short-term memory Following Commands: Follows multi-step commands with increased time Safety/Judgement: Decreased awareness of safety;Decreased awareness of deficits Awareness: Emergent Problem Solving: Difficulty sequencing;Requires verbal cues;Requires tactile cues General Comments: cognition much better today however continues to have poor safety awareness         General Comments General comments (skin integrity, edema, etc.): VSS on RA, HR max noted 89 bpm,       Pertinent Vitals/Pain Pain Assessment: No/denies pain           PT Goals (current goals can now be found in the care plan section) Acute Rehab PT Goals Patient Stated Goal: go home  PT Goal Formulation: With patient Time For Goal Achievement: 06/26/20 Potential to Achieve Goals: Good Progress towards PT goals: Progressing toward goals    Frequency    Min 4X/week      PT Plan Frequency needs to be updated       AM-PAC PT "6 Clicks" Mobility   Outcome Measure  Help needed turning from your back to your side while in a flat bed without using bedrails?: None Help needed moving from lying on your back to sitting on the side of a flat bed without using bedrails?: A Little Help needed moving to and from a bed to a chair (including a wheelchair)?: A Little Help needed standing up from a chair using your arms (e.g., wheelchair or bedside chair)?: A Little Help needed to walk in hospital room?: A Lot Help needed climbing 3-5 steps with a railing? : A Lot 6 Click Score: 17    End of Session Equipment Utilized During Treatment: Gait belt Activity Tolerance: Patient tolerated treatment well Patient left: in chair;with call bell/phone within reach;with chair alarm set Nurse Communication: Mobility  status PT Visit Diagnosis: Unsteadiness on feet (R26.81);Other abnormalities of gait and mobility (R26.89);Muscle weakness (generalized) (M62.81);Difficulty in walking, not elsewhere classified (R26.2)     Time: 3790-2409 PT Time Calculation (min) (ACUTE ONLY): 28 min  Charges:  $Gait Training: 8-22 mins $Therapeutic Activity: 8-22 mins                     Olie Scaffidi B. Migdalia Dk PT, DPT Acute Rehabilitation Services Pager [phone] Office [phone]    [place] 06/20/2020, 1:27 PM

## 2020-06-20 NOTE — Progress Notes (Addendum)
Electrophysiology Rounding Note  Patient Name: [name] Date of Encounter: 06/20/2020  Primary Cardiologist: Sanda Klein, MD Electrophysiologist: Dr. Quentin Ore   Subjective   The patient is doing well today.  At this time, the patient denies chest pain, shortness of breath, or any new concerns.  Inpatient Medications    Scheduled Meds: .  stroke: mapping our early stages of recovery book   Does not apply Once  . amiodarone  200 mg Per Tube BID  . aspirin EC  81 mg Oral Daily  . atorvastatin  80 mg Per Tube q1800  . chlorhexidine  15 mL Mouth Rinse BID  . Chlorhexidine Gluconate Cloth  6 each Topical Daily  . digoxin  0.125 mg Per Tube Daily  . escitalopram  10 mg Per Tube Daily  . ezetimibe  10 mg Per Tube Daily  . feeding supplement (PROSource TF)  45 mL Per Tube QID  . hydrALAZINE  10 mg Per Tube Q8H  . lactulose  10 g Per Tube BID  . mouth rinse  15 mL Mouth Rinse q12n4p  . sacubitril-valsartan  1 tablet Per Tube BID  . sodium chloride flush  3 mL Intravenous Q12H  . spironolactone  25 mg Per Tube Daily   Continuous Infusions: . feeding supplement (OSMOLITE 1.5 CAL) 75 mL/hr at 06/19/20 1900   PRN Meds: acetaminophen (TYLENOL) oral liquid 160 mg/5 mL, albuterol, guaiFENesin, loperamide HCl, nitroGLYCERIN, ondansetron (ZOFRAN) IV   Vital Signs    Vitals:   06/20/20 0401 06/20/20 0549 06/20/20 0749 06/20/20 0902  BP: 132/75  130/89   Pulse: 77  73 65  Resp: 20 (!) 25 20   Temp: 98.8 F (37.1 C)  98.5 F (36.9 C)   TempSrc: Oral  Oral   SpO2: 98%  94%   Weight:  84.5 kg    Height:        Intake/Output Summary (Last 24 hours) at 06/20/2020 0919 Last data filed at 06/20/2020 0500 Gross per 24 hour  Intake 4582.65 ml  Output 900 ml  Net 3682.65 ml   Filed Weights   06/18/20 0223 06/19/20 0359 06/20/20 0549  Weight: 86.6 kg 86.9 kg 84.5 kg    Physical Exam    GEN- The patient is well appearing, alert and oriented x 3 today.   Head-  normocephalic, atraumatic Eyes-  Sclera clear, conjunctiva pink Ears- hearing intact Oropharynx- clear Neck- supple Lungs- Clear to ausculation bilaterally, normal work of breathing Heart- Regular rate and rhythm, no murmurs, rubs or gallops GI- soft, NT, ND, + BS Extremities- no clubbing or cyanosis. No edema Skin- no rash or lesion Psych- euthymic mood, full affect Neuro- strength and sensation are intact  Labs    CBC Recent Labs    06/18/20 0206 06/20/20 0056  WBC 14.7* 15.2*  NEUTROABS 9.8* 10.9*  HGB 14.1 14.0  HCT 42.9 41.0  MCV 92.3 91.7  PLT 458* 834*   Basic Metabolic Panel Recent Labs    06/19/20 0202 06/20/20 0056  NA 137 137  K 4.2 4.1  CL 104 103  CO2 23 25  GLUCOSE 116* 114*  BUN 20 20  CREATININE 1.07 1.12  CALCIUM 8.5* 8.7*  MG 2.2 2.1  PHOS 3.1 3.6   Liver Function Tests Recent Labs    06/19/20 0202 06/20/20 0056  AST 44* 41  ALT 74* 68*  ALKPHOS 58 64  BILITOT 0.9 1.0  PROT 6.6 6.8  ALBUMIN 3.0* 3.0*   No results for input(s): LIPASE,  AMYLASE in the last 72 hours. Cardiac Enzymes No results for input(s): CKTOTAL, CKMB, CKMBINDEX, TROPONINI in the last 72 hours.   Telemetry    NSR 60-70s (personally reviewed)  Radiology    DG Chest 2 View  Result Date: 06/20/2020 CLINICAL DATA:  AICD. EXAM: CHEST - 2 VIEW COMPARISON:  Chest x-ray 06/18/2020. FINDINGS: Feeding tube noted with tip over the stomach. AICD noted with lead tips over the right atrium and right ventricle. Prior CABG. Heart size normal. Low lung volumes. No focal infiltrate. No pleural effusion or pneumothorax. IMPRESSION: 1. Feeding tube noted with tip over the stomach. 2. AICD noted with lead tips over the right atrium and right ventricle. Prior CABG. Heart size normal. 3. No acute pulmonary disease.  No pneumothorax. Electronically Signed   By: Marcello Moores  Register   On: 06/20/2020 06:45    Patient Profile     64 y.o.malewith aPMH of CAD (s/p CABG in 2014 with  LIMA-LAD, SVG-OM2 and SVG-RCA, s/p angioplasty to proximal SVG-RCA and DES to SVG-RCA anastomosisin 03/2019), chronic combined systolic and diastolic CHF/ICM (EF 90-21% by echo in 06/2019), HTN, HLD,prior CVAand recent STEMIwith angioplasty of the distal bypass graft10/21/21  EMS called for [place] syncope at church found by EMS [place] treated with amio bolus' repeat bolus in ER >> asystole >> external pacing>> emergent cath/temp wire placement, intubated sedated.  Assessment & Plan    1. VTarrest -> asystole S/p [employer] Jude Dual chamber ICD 06/19/2020 Site stable, pressure dressing remains in place.  CXR today personally reviewed and stable appearing lead appearence without PTx.  Continue amiodarone  2.Chronic systolic HF with biventricular failure ->cardiogenic shock  -Echo10/21/21 EF 25%. Moderate RV to severe RV dysfunction and possible LV thrombus. LifeVest placed. Previously d/c'd home on 05/06/20 -RHC this admission with preserved output and well compensated filling pressures.  On dig, entresto, aldactone. Appreciate HF team input.  - BP 130s. Will start Toprol 25 mg daily now post ICD.   3. CAD s/p CABG  - Inferior STEMI 05/03/20 s/p POBA SVG->PDA (felt 2/2 missed brilinta doses) - Emergent cath 11/21/21with stable coronary anatomy - on DAPT/statin/zetia  4. Pulmonary embolism Resume OAC (Xarelto) this evening.   Disposition: [employer] for [employer] pending.   ADDENDUM: Discussed with PharmD. Xarelto approx $60 more than Eliquis, which is $160. OK to use Eliquis. Discussed timing with Dr. Quentin Ore and would still start TONIGHT.   For questions or updates, please contact [place] Please consult [email or web address] for contact info under Cardiology/STEMI.  Signed, Shirley Friar, PA-C  06/20/2020, 9:19 AM

## 2020-06-20 NOTE — Progress Notes (Signed)
Calorie Count Note  RD placed calorie count envelope on the patient's door. Nursing to document percent consumed for each item on the patient's meal tray ticket and keep in envelope. Also document percent of any supplement or snack pt consumes and keep documentation in envelope for RD to review.   Tube feeding orders:  -Osmolite 1.5 @ 75 ml/hr x 14 hours (1800-0800) via Cortrak -45 ml ProSource QID  Provides: 1735 kcals, 110 grams protein, 800 ml free water. Meets 87% kcal and 100% of protein needs.   No day time feeding to allow PO intake progression.   [name] RD, LDN Clinical Nutrition Pager listed in [place]

## 2020-06-20 NOTE — Progress Notes (Signed)
Occupational Therapy Treatment Patient Details Name: [name] MRN: [id number] DOB: [birth year] Today's Date: 06/20/2020    History of present illness 64 yo with a hx as stated above who presented to [facility]20 after having a pre-syncopal symptoms including dizzinezss while at church earlier today found to be in VT (not wearing Life Vest as prescribed on recent hospital discharge) on EMS arrival. Has not been compliant with LifeVest stating the battery pack too bulky. PMH of CAD (s/p CABG in 2014 with LIMA-LAD, SVG-OM2 and SVG-RCA, s/p angioplasty to proximal SVG-RCA and DES to SVG-RCA anastomosis in 03/2019), chronic combined systolic and diastolic CHF/ICM (EF 82-95% by echo in 06/2019), HTN, HLD, prior CVA and recent STEMI with angioplasty of the distal bypass graft 05/04/20. Admitted 06/04/20 for treatment of wide complex tachycardia. Intubated 11/21-Extubated 11/23 and reintubated extubated 11/26 R radial aline 11/23 TEE 11/24: EF 30-35% no evidence of LV clot.  11/30 pt noted to have increased R sided weakness by OT, code stroke called MRI revealed Small acute infarcts involving the left frontal precentral gyrus, ICD placed 06/19/20   OT comments  Patient continues to make steady progress towards goals in skilled OT session. Patient's session encompassed further assessment of cognition, safety awareness and functional mobility. Pt with significant improvement in cognition as far as orientation is concerned and being able to maintain attention when having a conversation, however continues to require poor safety awareness (coming into standing prior to therapist being ready, and trying to push with LUE despite sling and recalling placement of ICD). Discharge to CIR remains appropriate in order to address functional deficits, therapy to continue to follow.    Follow Up Recommendations  CIR;Supervision/Assistance - 24 hour    Equipment Recommendations  3 in 1 bedside commode     Recommendations for Other Services      Precautions / Restrictions Precautions Precautions: Fall;ICD/Pacemaker Precaution Comments: Cortrak Required Braces or Orthoses: Sling (to reduce LUE movement) Restrictions Weight Bearing Restrictions: No       Mobility Bed Mobility Overal bed mobility: Needs Assistance Bed Mobility: Sidelying to Sit;Rolling Rolling: Min guard Sidelying to sit: HOB elevated;Min guard       General bed mobility comments: min guard for rolling to R and for safety with use of bedrail to pushup to sitting with R UE  Transfers Overall transfer level: Needs assistance Equipment used: 1 person hand held assist Transfers: Sit to/from Stand Sit to Stand: Min guard Stand pivot transfers: Min guard       General transfer comment: min guard for safety     Balance Overall balance assessment: Needs assistance Sitting-balance support: Feet supported;No upper extremity supported Sitting balance-Leahy Scale: Fair     Standing balance support: During functional activity;Single extremity supported Standing balance-Leahy Scale: Poor Standing balance comment: reliant on external support                           ADL either performed or assessed with clinical judgement   ADL Overall ADL's : Needs assistance/impaired                                     Functional mobility during ADLs: Minimal assistance       Vision       Perception     Praxis      Cognition Arousal/Alertness: Awake/alert Behavior During Therapy: WFL for tasks assessed/performed Overall  Cognitive Status: Impaired/Different from baseline Area of Impairment: Following commands;Awareness;Problem solving;Safety/judgement                     Memory: Decreased short-term memory Following Commands: Follows multi-step commands with increased time Safety/Judgement: Decreased awareness of safety;Decreased awareness of deficits Awareness:  Emergent Problem Solving: Difficulty sequencing;Requires verbal cues;Requires tactile cues General Comments: cognition much better today however continues to have poor safety awareness        Exercises     Shoulder Instructions       General Comments VSS on RA, HR max noted 89 bpm,     Pertinent Vitals/ Pain       Pain Assessment: No/denies pain  Home Living                                          Prior Functioning/Environment              Frequency  Min 3X/week        Progress Toward Goals  OT Goals(current goals can now be found in the care plan section)  Progress towards OT goals: Progressing toward goals  Acute Rehab OT Goals Patient Stated Goal: go home  OT Goal Formulation: With patient Time For Goal Achievement: 06/27/20 Potential to Achieve Goals: Good  Plan Discharge plan remains appropriate;Frequency remains appropriate    Co-evaluation                 AM-PAC OT "6 Clicks" Daily Activity     Outcome Measure   Help from another person eating meals?: None Help from another person taking care of personal grooming?: A Little Help from another person toileting, which includes using toliet, bedpan, or urinal?: A Little Help from another person bathing (including washing, rinsing, drying)?: A Little Help from another person to put on and taking off regular upper body clothing?: A Little Help from another person to put on and taking off regular lower body clothing?: A Lot 6 Click Score: 18    End of Session Equipment Utilized During Treatment: Other (comment) (sling and one person hand held assist)  OT Visit Diagnosis: Unsteadiness on feet (R26.81);Other abnormalities of gait and mobility (R26.89);Muscle weakness (generalized) (M62.81)   Activity Tolerance Patient tolerated treatment well   Patient Left in bed;with call bell/phone within reach;with bed alarm set (sitting EOB RN notified)   Nurse Communication Mobility  status;Other (comment) (sitting EOB)        Time: 6160-7371 OT Time Calculation (min): 23 min  Charges: OT General Charges $OT Visit: 1 Visit OT Treatments $Self Care/Home Management : 23-37 mins  [place]. [place], [place] [facility] [place] 06/20/2020, 2:43 PM

## 2020-06-21 DIAGNOSIS — L8990 Pressure ulcer of unspecified site, unspecified stage: Secondary | ICD-10-CM | POA: Insufficient documentation

## 2020-06-21 DIAGNOSIS — Z95810 Presence of automatic (implantable) cardiac defibrillator: Secondary | ICD-10-CM

## 2020-06-21 LAB — CBC WITH DIFFERENTIAL/PLATELET
Abs Immature Granulocytes: 0.1 10*3/uL — ABNORMAL HIGH (ref 0.00–0.07)
Basophils Absolute: 0.1 10*3/uL (ref 0.0–0.1)
Basophils Relative: 1 %
Eosinophils Absolute: 0.4 10*3/uL (ref 0.0–0.5)
Eosinophils Relative: 3 %
HCT: 43 % (ref 39.0–52.0)
Hemoglobin: 14.2 g/dL (ref 13.0–17.0)
Immature Granulocytes: 1 %
Lymphocytes Relative: 21 %
Lymphs Abs: 2.8 10*3/uL (ref 0.7–4.0)
MCH: 30.9 pg (ref 26.0–34.0)
MCHC: 33 g/dL (ref 30.0–36.0)
MCV: 93.7 fL (ref 80.0–100.0)
Monocytes Absolute: 1.4 10*3/uL — ABNORMAL HIGH (ref 0.1–1.0)
Monocytes Relative: 11 %
Neutro Abs: 8.6 10*3/uL — ABNORMAL HIGH (ref 1.7–7.7)
Neutrophils Relative %: 63 %
Platelets: 515 10*3/uL — ABNORMAL HIGH (ref 150–400)
RBC: 4.59 MIL/uL (ref 4.22–5.81)
RDW: 13.4 % (ref 11.5–15.5)
WBC: 13.3 10*3/uL — ABNORMAL HIGH (ref 4.0–10.5)
nRBC: 0 % (ref 0.0–0.2)

## 2020-06-21 LAB — COMPREHENSIVE METABOLIC PANEL
ALT: 63 U/L — ABNORMAL HIGH (ref 0–44)
AST: 38 U/L (ref 15–41)
Albumin: 3.1 g/dL — ABNORMAL LOW (ref 3.5–5.0)
Alkaline Phosphatase: 60 U/L (ref 38–126)
Anion gap: 12 (ref 5–15)
BUN: 25 mg/dL — ABNORMAL HIGH (ref 8–23)
CO2: 22 mmol/L (ref 22–32)
Calcium: 8.7 mg/dL — ABNORMAL LOW (ref 8.9–10.3)
Chloride: 102 mmol/L (ref 98–111)
Creatinine, Ser: 1.05 mg/dL (ref 0.61–1.24)
GFR, Estimated: >60 mL/min (ref >60)
Glucose, Bld: 138 mg/dL — ABNORMAL HIGH (ref 70–99)
Potassium: 4.1 mmol/L (ref 3.5–5.1)
Sodium: 136 mmol/L (ref 135–145)
Total Bilirubin: 0.9 mg/dL (ref 0.3–1.2)
Total Protein: 7 g/dL (ref 6.5–8.1)

## 2020-06-21 LAB — GLUCOSE, CAPILLARY
Glucose-Capillary: 111 mg/dL — ABNORMAL HIGH (ref 70–99)
Glucose-Capillary: 142 mg/dL — ABNORMAL HIGH (ref 70–99)
Glucose-Capillary: 144 mg/dL — ABNORMAL HIGH (ref 70–99)
Glucose-Capillary: 151 mg/dL — ABNORMAL HIGH (ref 70–99)
Glucose-Capillary: 160 mg/dL — ABNORMAL HIGH (ref 70–99)
Glucose-Capillary: 88 mg/dL (ref 70–99)
Glucose-Capillary: 98 mg/dL (ref 70–99)

## 2020-06-21 LAB — PHOSPHORUS: Phosphorus: 4 mg/dL (ref 2.5–4.6)

## 2020-06-21 LAB — MAGNESIUM: Magnesium: 2.1 mg/dL (ref 1.7–2.4)

## 2020-06-21 MED ORDER — CLOPIDOGREL BISULFATE 75 MG PO TABS
75.0000 mg | ORAL_TABLET | Freq: Every day | ORAL | Status: DC
  Administered 2020-06-21 – 2020-06-25 (×5): 75 mg via ORAL
  Filled 2020-06-21 (×5): qty 1

## 2020-06-21 NOTE — Plan of Care (Signed)
  Problem: Nutrition: Goal: Dietary intake will improve Outcome: Not Progressing  Pt still not eating much. 20% of meal

## 2020-06-21 NOTE — Progress Notes (Signed)
Inpatient Rehabilitation-Admissions Coordinator   Pt's insurance has approved an IP Rehab request. Unfortunately, I do not have a bed available today for this patient. Will follow up tomorrow for possible admit, pending bed availability.   Raechel Ache, OTR/L  Rehab Admissions Coordinator  [phone] 06/21/2020 10:34 AM

## 2020-06-21 NOTE — Progress Notes (Addendum)
Calorie Count Note  48 hour calorie count ordered.  Diet: Regular  Supplements: Ensure Enlive po TID + Magic Cup TID  Day 1- 12/7 Breakfast: skipped Lunch: 222 kcal 6 g protein Dinner: 200 kcal 3 g protein Supplements: 700 kcal 26 g protein  Total intake: 1122 kcal (56% of minimum estimated needs)  35 protein (32% of minimum estimated needs)  Estimated Nutritional Needs:  Kcal:  2000-2200 kcals Protein:  110-125 g Fluid:  >/= 2 L  Nutrition Dx: Inadequate oral intake related to inability to eat as evidenced by NPO status- Diet advanced   Goal: Patient will meet greater than or equal to 90% of their needs- progressing PO, addressed via TF  Intervention:    Ensure Enlive po TID, each supplement provides 350 kcal and 20 grams of protein  Magic cup TID with meals, each supplement provides 290 kcal and 9 grams of protein  Continue nocturnal tube feeding until PO intake progresses and remains consistent.   -Osmolite 1.5 @ 75 ml/hr x 14 hours (1800-0800) via Cortrak -45 ml ProSource QID  Provides: 1735 kcals, 110 grams protein, 800 ml free water. Meets 87% kcal and 100% of protein needs.   Mariana Single RD, LDN Clinical Nutrition Pager listed in [place]

## 2020-06-21 NOTE — Progress Notes (Signed)
PROGRESS NOTE    [name]  OHY:[id number] DOB: 05/03/1956 DOA: 06/04/2020 PCP: [name], MD     Brief Narrative:  64 yo WM PMHx CAD w/ previous CABG in 2014, chronic combined systolic and diastolic HF/ ICM, HTN, HLD, CVA, and recent inferior STEMI 05/03/20 secondary to thrombotic occlusion of the SVG to the RCA, likely due to missed ticagrelor doses.  EF noted to be 25% with moderate RV to severe RV dysfunction and possible LV thrombus 05/04/2020.  He was discharged home on 10/23 on a LifeVest, but apparently not using. On 11/21, he developed dizziness at church. EMS found patient in wide complex tachycardia treated with amiodarone and lidocaine with successful conversion to NSR.  On arrival to ER, patient had recurrence of his VT but hemodynamically tolerating.  He was given additional amiodarone and lidocaine gtt started, however developed severe bradycardia requiring atropine and external pacing.  He was intubated and taken emergently for temporary transvenous pacemaker and coronary angiogram. Patient admitted for further management   Subjective: Today, patient denies any new complaints, still with very poor appetite, denies any chest pain, shortness of breath, abdominal pain, nausea/vomiting, fever/chills.   Assessment & Plan:   Active Problems:   Respiratory failure with hypoxia (HCC)   Ventricular tachycardia (HCC)   Cardiogenic shock (HCC)   Acute stroke due to ischemia [facility])   Acute pulmonary embolism without acute cor pulmonale (HCC)   Cardiac defibrillator in situ   Pressure injury of skin   Recurrent VT/bradycardia S/p temporary pacing wire (removed 11/27), now with [employer] dual chamber ICD on 06/19/2020 Continue p.o. amiodarone, Toprol, digoxin EP team following  Cardiogenic shock/chronic systolic and diastolic CHF with biventricular failure Continue digoxin, Entresto, Aldactone, hydralazine, metoprolol, aspirin Cardiology/HF on board Continue strict  I's and O's, daily weights  CAD s/p CABG with recent inferior STEMI 04/2020 Management as above  Acute CVA MRI showed infarcts involving the left frontal precentral gyrus and the juxtacortical left frontal white matter, mild associated edema without mass-effect Continue Lipitor, aspirin, Plavix  Bilateral lobar and segmental PE/bilateral LE DVT Continue Eliquis  Dysphagia Improving, still with poor appetite Advance to regular diet, but still has core track in place pending improved calorie counts SLP on board Aspiration precautions  HTN Continue Aldactone, hydralazine  HLD Continue Lipitor 80 mg daily   Leukocytosis ?Possible HCAP Chest x-ray negative for any pneumonia Completed 7 days of Cefepime 11/29; cultures negative/ normal flora; continue to monitor clinically  Encephalopathy , Acute CVA-Embolic L infarcts Encephalopathy resolved Echocardiogram with bubble; negative for PFO see results below Neurology consulted  Deconditioning PT/OT  Mildly elevated liver enzymes Per patient and wife has significant history within his family of liver disease, some of it EtOH related.  Patient does not drink. Acute hepatitis panel negative, 12/3 US abdomen liver; negative Daily CMP   DVT prophylaxis: Eliquis Code Status: Full Family Communication: None at bedside  Status is: Inpatient    Dispo: The patient is from: Home              Anticipated d/c is to: CIR              Anticipated d/c date is: Once bed available in CIR              Patient currently unstable      Consultants:  HF tm PCCM EP Neurology   Procedures/Significant Events:  11/21 Admitted cardiology/ intubated/ L/RHC, TVP, PA catheter 11/22  Remains on NE, amio, heparin  gtt, decreased UOP.  CVP 8, CI 1.34, co-ox 60-66%, started on milrinone 11/23 Tmax 101.7, w/ increased respiratory secretions prompted the initiation of antibiotics. co-ox 72%, CVP 12, CI 3.2, SVR 1090. Able to titrate off  sedation and decrease NE; extubated 14:22 then re-intubated at 18:38.    11/21 R/LHC >   Severe 3 vessel occlusive CAD. 2. Patent LIMA to the LAD 3. Patent SVG to OM1 4. Patent SVG to RCA. Much improved flow from prior PCI. 5. Mildly elevated LVEDP 17 mm Hg 6. Successful placement of temporary transvenous pacemaker.  11/24 TEE >> neg for LV thrombus; EF 30-35%.Inferolateral HK  11/30 CT head>> 1. No acute abnormality. Generalized atrophy 2. Hypodensity right posterior cerebellum most consistent with chronic infarct, not present in 2020. 3. ASPECTS is 10  11/30 CTA head/ neck/ perfusion >> 1. No emergent large vessel occlusion or infarct/ischemia by perfusion. 2. Lobar and segmental pulmonary emboli incidentally noted in the upper lungs. 3. Minimal flow in the hypoplastic right vertebral artery. There is moderate to advanced atheromatous narrowing of the left V4 segment. 4. Carotid atherosclerosis without flow limiting stenosis or ulceration. 5. Suboptimal bolus.  11/30 CT head>>no acute findings 11/30 CTA head/neck>>no LVO 11/30 MRI brain>>Small acute infarcts involving the left frontal precentral gyrus and the juxtacortical left frontal white matter. Mild associated edema without mass effect 11/30 CT chest>>Bilateral multifocal pulmonary emboli the majority of which appear acute in nature. Some subacute to chronic thrombus is noted within the left lower lobe pulmonary artery. 12/1 echocardiogram with bubble study;Left Ventricle: Left ventricular ejection fraction, by estimation, is 25  to 30%. The left ventricle has severely decreased function.   Right Ventricle: The right ventricular size is mildly enlarged.   IAS/Shunts: Agitated saline contrast was given intravenously to evaluate  for intracardiac shunting. Agitated saline contrast bubble study was  negative, with no evidence of any interatrial shunt.    12/4 US abdomen RUQ; gallbladder sludge without evidence for acute  cholecystitis   Cultures 11/21 SARS2/ flu >>neg 11/21 MRSA PCR >>neg 11/22 BCx 2 >> neg 11/23 sputum >> normal flora 12/3 acute hepatitis panel negative    Antimicrobials: Anti-infectives (From admission, onward)   Start     Ordered Stop   06/16/20 1030  gentamicin (GARAMYCIN) 80 mg in sodium chloride 0.9 % 500 mL irrigation        06/15/20 2108 06/17/20 1030   06/16/20 1030  ceFAZolin (ANCEF) IVPB 2g/100 mL premix        06/15/20 2108 06/17/20 1030   06/09/20 1045  ceFEPIme (MAXIPIME) 2 g in sodium chloride 0.9 % 100 mL IVPB        06/09/20 0948 06/13/20 0329   06/06/20 0800  vancomycin (VANCOREADY) IVPB 750 mg/150 mL  Status:  Discontinued        06/06/20 0713 06/07/20 1201   06/06/20 0800  ceFEPIme (MAXIPIME) 2 g in sodium chloride 0.9 % 100 mL IVPB  Status:  Discontinued        06/06/20 0713 06/09/20 0948      Continuous Infusions: . feeding supplement (OSMOLITE 1.5 CAL) Stopped (06/21/20 0900)     Objective: Vitals:   06/21/20 0005 06/21/20 0420 06/21/20 0915 06/21/20 1109  BP: 119/85 135/88 127/87 106/77  Pulse: 68 75 77 (!) 50  Resp: 20 20 15 18   Temp: 97.7 F (36.5 C) 98.4 F (36.9 C) 97.8 F (36.6 C) 97.7 F (36.5 C)  TempSrc: Oral Oral Oral Oral  SpO2: 96% 96% 97% 97%  Weight:  84.1 kg    Height:        Intake/Output Summary (Last 24 hours) at 06/21/2020 1604 Last data filed at 06/21/2020 0900 Gross per 24 hour  Intake 1701.75 ml  Output 1150 ml  Net 551.75 ml   Filed Weights   06/19/20 0359 06/20/20 0549 06/21/20 0420  Weight: 86.9 kg 84.5 kg 84.1 kg   Physical Exam:  General: NAD   Cardiovascular: S1, S2 present  Respiratory: CTAB  Abdomen: Soft, nontender, nondistended, bowel sounds present  Musculoskeletal: No bilateral pedal edema noted  Skin: Normal  Psychiatry: Normal mood   CBC: Recent Labs  Lab 06/17/20 0058 06/18/20 0206 06/20/20 0056 06/21/20 0144  WBC 16.5* 14.7* 15.2* 13.3*  NEUTROABS 11.3* 9.8* 10.9* 8.6*   HGB 14.5 14.1 14.0 14.2  HCT 43.9 42.9 41.0 43.0  MCV 90.7 92.3 91.7 93.7  PLT 475* 458* 499* 417*   Basic Metabolic Panel: Recent Labs  Lab 06/17/20 0058 06/18/20 0206 06/19/20 0202 06/20/20 0056 06/21/20 0144  NA 137 140 137 137 136  K 4.1 4.3 4.2 4.1 4.1  CL 104 106 104 103 102  CO2 22 23 23 25 22   GLUCOSE 114* 112* 116* 114* 138*  BUN 17 20 20 20  25*  CREATININE 1.11 1.06 1.07 1.12 1.05  CALCIUM 8.8* 8.5* 8.5* 8.7* 8.7*  MG 1.9 1.9 2.2 2.1 2.1  PHOS 3.8 4.0 3.1 3.6 4.0   GFR: Estimated Creatinine Clearance: 71.7 mL/min (by C-G formula based on SCr of 1.05 mg/dL). Liver Function Tests: Recent Labs  Lab 06/17/20 0058 06/18/20 0206 06/19/20 0202 06/20/20 0056 06/21/20 0144  AST 64* 53* 44* 41 38  ALT 84* 83* 74* 68* 63*  ALKPHOS 66 57 58 64 60  BILITOT 1.5* 1.0 0.9 1.0 0.9  PROT 6.9 7.1 6.6 6.8 7.0  ALBUMIN 3.0* 2.9* 3.0* 3.0* 3.1*   No results for input(s): LIPASE, AMYLASE in the last 168 hours. Recent Labs  Lab 06/16/20 0125 06/17/20 0058 06/18/20 0206 06/19/20 0202 06/20/20 0056  AMMONIA 57* 40* 31 38* 38*   Coagulation Profile: No results for input(s): INR, PROTIME in the last 168 hours. Cardiac Enzymes: No results for input(s): CKTOTAL, CKMB, CKMBINDEX, TROPONINI in the last 168 hours. BNP (last 3 results) No results for input(s): PROBNP in the last 8760 hours. HbA1C: No results for input(s): HGBA1C in the last 72 hours. CBG: Recent Labs  Lab 06/20/20 2033 06/21/20 0002 06/21/20 0416 06/21/20 0913 06/21/20 1216  GLUCAP 129* 151* 144* 160* 111*   Lipid Profile: No results for input(s): CHOL, HDL, LDLCALC, TRIG, CHOLHDL, LDLDIRECT in the last 72 hours. Thyroid Function Tests: No results for input(s): TSH, T4TOTAL, FREET4, T3FREE, THYROIDAB in the last 72 hours. Anemia Panel: No results for input(s): VITAMINB12, FOLATE, FERRITIN, TIBC, IRON, RETICCTPCT in the last 72 hours. Sepsis Labs: No results for input(s): PROCALCITON, LATICACIDVEN  in the last 168 hours.  Recent Results (from the past 240 hour(s))  Surgical PCR screen     Status: None   Collection Time: 06/15/20 10:50 PM   Specimen: Nasal Mucosa; Nasal Swab  Result Value Ref Range Status   MRSA, PCR NEGATIVE NEGATIVE Final   Staphylococcus aureus NEGATIVE NEGATIVE Final    Comment: (NOTE) The Xpert SA Assay (FDA approved for NASAL specimens in patients 35 years of age and older), is one component of a comprehensive surveillance program. It is not intended to diagnose infection nor to guide or monitor treatment. Performed at [facility], [address] [place], [place] [address]          Radiology Studies: DG Chest 2 View  Result Date: 06/20/2020 CLINICAL DATA:  AICD. EXAM: CHEST - 2 VIEW COMPARISON:  Chest x-ray 06/18/2020. FINDINGS: Feeding tube noted with tip over the stomach. AICD noted with lead tips over the right atrium and right ventricle. Prior CABG. Heart size normal. Low lung volumes. No focal infiltrate. No pleural effusion or pneumothorax. IMPRESSION: 1. Feeding tube noted with tip over the stomach. 2. AICD noted with lead tips over the right atrium and right ventricle. Prior CABG. Heart size normal. 3. No acute pulmonary disease.  No pneumothorax. Electronically Signed   By: Marcello Moores  Register   On: 06/20/2020 06:45        Scheduled Meds: .  stroke: mapping our early stages of recovery book   Does not apply Once  . amiodarone  200 mg Per Tube BID  . apixaban  5 mg Oral BID  . aspirin EC  81 mg Oral Daily  . atorvastatin  80 mg Per Tube q1800  . chlorhexidine  15 mL Mouth Rinse BID  . Chlorhexidine Gluconate Cloth  6 each Topical Daily  . clopidogrel  75 mg Oral Daily  . digoxin  0.125 mg Per Tube Daily  . escitalopram  10 mg Per Tube Daily  . ezetimibe  10 mg Per Tube Daily  . feeding supplement  237 mL Oral TID BM  . feeding supplement (PROSource TF)  45 mL Per Tube QID  . hydrALAZINE  10 mg Per Tube Q8H  . mouth rinse  15 mL  Mouth Rinse q12n4p  . metoprolol succinate  25 mg Oral Daily  . sacubitril-valsartan  1 tablet Per Tube BID  . sodium chloride flush  3 mL Intravenous Q12H  . spironolactone  25 mg Per Tube Daily   Continuous Infusions: . feeding supplement (OSMOLITE 1.5 CAL) Stopped (06/21/20 0900)     LOS: 17 days     Alma Friendly, MD [facility]   If 7PM-7AM, please contact night-coverage 06/21/2020, 4:04 PM

## 2020-06-21 NOTE — H&P (Signed)
Physical Medicine and Rehabilitation Admission H&P    Chief Complaint  Patient presents with   Dizziness    Vtach  : HPI: [name] is a 64 year old right-handed male with history of CAD status post CABG recent non-STEMI 10/20 with revascularization and question of LV thrombus and ejection fraction 25% therefore LifeVest was placed. Per chart review patient lives with spouse. Mobile home with 4 steps to entry. Reportedly independent prior to admission. Presented 06/04/2020 with VT arrest in setting of severe ICM and treated with amiodarone, lidocaine, atropine as well as external pacing and was intubated for airway protection. He underwent cardiac catheterization showing normal anatomy. Echocardiogram ejection fraction 25 to 30% with linear filling defect was started on IV heparin due to concerns of thrombus. He had difficulty with vent wean developed fevers leukocytosis purulent respiratory secretions and placed on broad-spectrum antibiotics. TEE 06/07/2020 showed ejection fraction 30 to 35% no thrombus, PFO or ASD noted. Cardiology follow-up question plans for ICD placement. He tolerated extubation nasogastric tube in place for nutritional support. On 06/13/2020 he was found to have a right upper extremity weakness as well as speech difficulty with confusion code stroke initiated. CTA head neck was negative for LVO or ischemia but showed lobar and segmental pulmonary emboli in upper lungs. CTA lungs done for follow-up showed bilateral multifocal PE majority acute in addition to subacute and chronic thrombus in LLL PA. MRI of the brain showed small acute infarct in left frontal precentral gyrus and juxtacortical left frontal white matter with mild edema. Patient was placed on Eliquis. Cardiology follow-up and patient underwent [place] ICD dual-chamber pacemaker placement 06/19/2020. He is tolerating a regular diet however receiving supplemental nutritional support through nasogastric tube 14  hours a day. Therapy evaluations completed and patient was admitted for a comprehensive rehab program.  Review of Systems  Constitutional: Negative for chills and fever.  HENT: Negative for hearing loss.   Eyes: Negative for blurred vision and double vision.  Respiratory: Positive for shortness of breath.   Cardiovascular: Positive for leg swelling. Negative for chest pain and palpitations.  Gastrointestinal: Positive for constipation and heartburn. Negative for nausea and vomiting.  Genitourinary: Negative for dysuria, flank pain and hematuria.  Musculoskeletal: Positive for falls and myalgias.  Skin: Negative for rash.  Neurological: Positive for weakness.  Psychiatric/Behavioral: Positive for depression and memory loss.  All other systems reviewed and are negative.  Past Medical History:  Diagnosis Date   Borderline diabetic    Coronary artery disease    a. s/p CABG in 2014 with LIMA-LAD, SVG-OM2 and SVG-RC b. 03/2019: STEMI and s/p angioplasty to proximal SVG-RCA and DES to SVG-RCA anastomosis c. 04/2020: recurrent STEMI with angioplasty of SVG-RCA   Dyslipidemia 01/11/2013   Hiatal hernia    Hypertension    Mini stroke ([place])    2   S/P CABG x 3    STEMI (ST elevation myocardial infarction) ([place])    Stroke [facility])    Past Surgical History:  Procedure Laterality Date   CARDIAC CATHETERIZATION     CORONARY ARTERY BYPASS GRAFT N/A 09/01/2012   Procedure: CORONARY ARTERY BYPASS GRAFTING (CABG);  Surgeon: Gaye Pollack, MD;  Location: [place];  Service: Open Heart Surgery;  Laterality: N/A;  Coronary Artery Bypass Grafting Times Three Using Left Internal Mammary Artery and Left Saphenous leg Vein Harvested Endoscopically   CORONARY STENT INTERVENTION N/A 03/23/2019   Procedure: CORONARY STENT INTERVENTION;  Surgeon: Lorretta Harp, MD;  Location: [place] [facility];  Service: Cardiovascular;  Laterality: N/A;   CORONARY/GRAFT ACUTE MI REVASCULARIZATION N/A 03/23/2019   Procedure:  Coronary/Graft Acute MI Revascularization;  Surgeon: Lorretta Harp, MD;  Location: [place] [facility];  Service: Cardiovascular;  Laterality: N/A;   CORONARY/GRAFT ACUTE MI REVASCULARIZATION N/A 05/03/2020   Procedure: Coronary/Graft Acute MI Revascularization;  Surgeon: Belva Crome, MD;  Location: [place] [facility];  Service: Cardiovascular;  Laterality: N/A;   IABP INSERTION N/A 06/04/2020   Procedure: IABP Insertion;  Surgeon: Martinique, Peter M, MD;  Location: [place] [facility];  Service: Cardiovascular;  Laterality: N/A;   ICD IMPLANT N/A 06/19/2020   Procedure: ICD IMPLANT;  Surgeon: Vickie Epley, MD;  Location: [place] [facility];  Service: Cardiovascular;  Laterality: N/A;   INTRAOPERATIVE TRANSESOPHAGEAL ECHOCARDIOGRAM N/A 09/01/2012   Procedure: INTRAOPERATIVE TRANSESOPHAGEAL ECHOCARDIOGRAM;  Surgeon: Gaye Pollack, MD;  Location: [place] [facility];  Service: Open Heart Surgery;  Laterality: N/A;   LEFT HEART CATH AND CORONARY ANGIOGRAPHY N/A 03/23/2019   Procedure: LEFT HEART CATH AND CORONARY ANGIOGRAPHY;  Surgeon: Lorretta Harp, MD;  Location: [place] [facility];  Service: Cardiovascular;  Laterality: N/A;   LEFT HEART CATH AND CORONARY ANGIOGRAPHY N/A 05/03/2020   Procedure: LEFT HEART CATH AND CORONARY ANGIOGRAPHY;  Surgeon: Belva Crome, MD;  Location: [place] [facility];  Service: Cardiovascular;  Laterality: N/A;   LEFT HEART CATHETERIZATION WITH CORONARY ANGIOGRAM N/A 08/31/2012   Procedure: LEFT HEART CATHETERIZATION WITH CORONARY ANGIOGRAM;  Surgeon: Peter M Martinique, MD;  Location: [facility] CATH LAB;  Service: Cardiovascular;  Laterality: N/A;   PERCUTANEOUS CORONARY STENT INTERVENTION (PCI-S) N/A 08/31/2012   Procedure: PERCUTANEOUS CORONARY STENT INTERVENTION (PCI-S);  Surgeon: Peter M Martinique, MD;  Location: [facility] LAB;  Service: Cardiovascular;  Laterality: N/A;   RIGHT/LEFT HEART CATH AND CORONARY/GRAFT ANGIOGRAPHY N/A 06/04/2020   Procedure: RIGHT/LEFT HEART CATH AND  CORONARY/GRAFT ANGIOGRAPHY;  Surgeon: Martinique, Peter M, MD;  Location: [place] [facility];  Service: Cardiovascular;  Laterality: N/A;   TEMPORARY PACEMAKER N/A 06/04/2020   Procedure: TEMPORARY PACEMAKER;  Surgeon: Martinique, Peter M, MD;  Location: [place] [facility];  Service: Cardiovascular;  Laterality: N/A;   Family History  Problem Relation Age of Onset   Cancer Father    Hypertension Father    Hyperlipidemia Father    Heart attack Father    Hypertension Sister    Hypertension Brother    Hyperlipidemia Brother    Heart attack Sister    Heart attack Brother    Social History:  reports that he has quit smoking. His smoking use included pipe. He quit smokeless tobacco use about 51 years ago. He reports that he does not drink alcohol and does not use drugs. Allergies: No Known Allergies Medications Prior to Admission  Medication Sig Dispense Refill   aspirin 81 MG chewable tablet Chew 81 mg by mouth daily.     clopidogrel (PLAVIX) 75 MG tablet Take 1 tablet (75 mg total) by mouth daily. 90 tablet 3   escitalopram (LEXAPRO) 10 MG tablet Take 1 tablet (10 mg total) by mouth daily. 30 tablet 11   ezetimibe (ZETIA) 10 MG tablet Take 1 tablet (10 mg total) by mouth daily. 90 tablet 3   metoprolol succinate (TOPROL-XL) 50 MG 24 hr tablet Take 1.5 tablets (75 mg total) by mouth daily. Take with or immediately following a meal. 135 tablet 1   Multiple Vitamins-Minerals (ONE-A-DAY MENS 50+ ADVANTAGE) TABS Take 1 tablet by mouth daily with  breakfast.     nitroGLYCERIN (NITROSTAT) 0.4 MG SL tablet Place 1 tablet (0.4 mg total) under the tongue every 5 (five) minutes x 3 doses as needed for chest pain. 25 tablet 2   Omega-3 Fatty Acids (FISH OIL) 1000 MG CAPS Take 1,000 mg by mouth in the morning and at bedtime.     sacubitril-valsartan (ENTRESTO) 49-51 MG Take 1 tablet by mouth 2 (two) times daily. 60 tablet 1   ALPRAZolam (XANAX) 0.5 MG tablet Take 1 tablet (0.5 mg total) by mouth at bedtime as  needed for anxiety. 20 tablet 0    Drug Regimen Review Drug regimen was reviewed and remains appropriate with no significant issues identified  Home: Home Living Family/patient expects to be discharged to:: Private residence Living Arrangements: Spouse/significant other Available Help at Discharge: Family Type of Home: Mobile home (modular home) Home Access: Stairs to enter Entrance Stairs-Number of Steps: 4 Entrance Stairs-Rails: Right, Left Home Layout: One level Bathroom Shower/Tub: Chiropodist: Standard Bathroom Accessibility: Yes Home Equipment: None   Functional History: Prior Function Level of Independence: Independent Comments: Works partime  Functional Status:  Mobility: Bed Mobility Overal bed mobility: Needs Assistance Bed Mobility: Sidelying to Sit, Rolling Rolling: Min guard Sidelying to sit: HOB elevated, Min guard Supine to sit: HOB elevated, Min assist Sit to supine: Supervision, HOB elevated General bed mobility comments: min guard for rolling to R and for safety with use of bedrail to pushup to sitting with R UE Transfers Overall transfer level: Needs assistance Equipment used: 1 person hand held assist Transfers: Sit to/from Stand Sit to Stand: Min guard Stand pivot transfers: Min guard General transfer comment: min guard for safety  Ambulation/Gait Ambulation/Gait assistance: Min assist Gait Distance (Feet): 470 Feet Assistive device: Rolling walker (2 wheeled) Gait Pattern/deviations: Decreased step length - left, Decreased step length - right, Trunk flexed, Step-through pattern, Shuffle General Gait Details: min A for steadying with RW, vc for slowing pace for safety, to promote good proximity and upright posture Gait velocity: too fast for condition  Gait velocity interpretation: <1.8 ft/sec, indicate of risk for recurrent falls    ADL: ADL Overall ADL's : Needs assistance/impaired Eating/Feeding: Independent,  Sitting Eating/Feeding Details (indicate cue type and reason): Patient able to eat lunch this date: regular diet with thin liquids. Grooming: Wash/dry face, Standing, Supervision/safety Grooming Details (indicate cue type and reason): While sitting at EOB, pt washing his face. However, reaching for washcloth initially with reaching with RUE, having difficulty with coorindation and grasp, and switching to reach with LUE. Asking pt which is his dominant hand, and he reports R. Pt proceeds to wash face with LUE as he is having dififculty coorindating RUE. Unable to bring wash cloth to face with R.  Upper Body Bathing: Supervision/ safety, Sitting Lower Body Bathing: Minimal assistance, Sit to/from stand Upper Body Dressing : Minimal assistance, Sitting Lower Body Dressing: Minimal assistance, Sit to/from stand Lower Body Dressing Details (indicate cue type and reason): Continued difficulty in coordination of movement Toilet Transfer: Min guard, RW, Ambulation Toilet Transfer Details (indicate cue type and reason): simulated via functionalmobilityu with RW, min guard fro safety Functional mobility during ADLs: Minimal assistance General ADL Comments: increased SOB noted however SpO2 96% post ambulation  Cognition: Cognition Overall Cognitive Status: Impaired/Different from baseline Arousal/Alertness: Awake/alert Orientation Level: Oriented X4 Attention: Focused, Sustained Focused Attention: Appears intact Sustained Attention: Impaired Memory: Impaired Memory Impairment: Storage deficit, Retrieval deficit Awareness: Impaired Awareness Impairment: Emergent impairment Cognition Arousal/Alertness: Awake/alert Behavior  During Therapy: WFL for tasks assessed/performed Overall Cognitive Status: Impaired/Different from baseline Area of Impairment: Following commands, Awareness, Problem solving, Safety/judgement Orientation Level: Person, Place, Situation Memory: Decreased short-term  memory Following Commands: Follows multi-step commands with increased time Safety/Judgement: Decreased awareness of safety, Decreased awareness of deficits Awareness: Emergent Problem Solving: Difficulty sequencing, Requires verbal cues, Requires tactile cues General Comments: cognition much better today however continues to have poor safety awareness  Physical Exam: Blood pressure 135/88, pulse 75, temperature 98.4 F (36.9 C), temperature source Oral, resp. rate 20, height 5\' 7"  (1.702 m), weight 84.1 kg, SpO2 96 %. Physical Exam Neurological:     Comments: Patient is alert in no acute distress.  Makes eye contact with examiner follows commands.  Oriented x3.     Results for orders placed or performed during the hospital encounter of 06/04/20 (from the past 48 hour(s))  Glucose, capillary     Status: Abnormal   Collection Time: 06/19/20  8:37 AM  Result Value Ref Range   Glucose-Capillary 136 (H) 70 - 99 mg/dL    Comment: Glucose reference range applies only to samples taken after fasting for at least 8 hours.  Glucose, capillary     Status: None   Collection Time: 06/19/20 11:30 AM  Result Value Ref Range   Glucose-Capillary 96 70 - 99 mg/dL    Comment: Glucose reference range applies only to samples taken after fasting for at least 8 hours.  Glucose, capillary     Status: Abnormal   Collection Time: 06/19/20  5:10 PM  Result Value Ref Range   Glucose-Capillary 104 (H) 70 - 99 mg/dL    Comment: Glucose reference range applies only to samples taken after fasting for at least 8 hours.  Glucose, capillary     Status: Abnormal   Collection Time: 06/19/20  7:50 PM  Result Value Ref Range   Glucose-Capillary 143 (H) 70 - 99 mg/dL    Comment: Glucose reference range applies only to samples taken after fasting for at least 8 hours.  Glucose, capillary     Status: Abnormal   Collection Time: 06/20/20 12:01 AM  Result Value Ref Range   Glucose-Capillary 124 (H) 70 - 99 mg/dL     Comment: Glucose reference range applies only to samples taken after fasting for at least 8 hours.  Magnesium     Status: None   Collection Time: 06/20/20 12:56 AM  Result Value Ref Range   Magnesium 2.1 1.7 - 2.4 mg/dL    Comment: Performed at [facility] [address], [place], [place] [address]  Phosphorus     Status: None   Collection Time: 06/20/20 12:56 AM  Result Value Ref Range   Phosphorus 3.6 2.5 - 4.6 mg/dL    Comment: Performed at [facility] [address], [place], [place] [address]  Ammonia     Status: Abnormal   Collection Time: 06/20/20 12:56 AM  Result Value Ref Range   Ammonia 38 (H) 9 - 35 umol/L    Comment: Performed at [facility], [place] [address] [place], [place] [address]  Heparin level (unfractionated)     Status: Abnormal   Collection Time: 06/20/20 12:56 AM  Result Value Ref Range   Heparin Unfractionated <0.10 (L) 0.30 - 0.70 IU/mL    Comment: REPEATED TO VERIFY (NOTE) If heparin results are below expected values, and patient dosage has  been confirmed, suggest follow up testing of antithrombin III levels. Performed at [facility], [place] [address] [place], [place] [address]   CBC with Differential/Platelet     Status: Abnormal   Collection Time: 06/20/20 12:56 AM  Result Value Ref Range   WBC 15.2 (H) 4.0 - 10.5 K/uL   RBC 4.47 4.22 - 5.81 MIL/uL   Hemoglobin 14.0 13.0 - 17.0 g/dL   HCT 41.0 39 - 52 %   MCV 91.7 80.0 - 100.0 fL   MCH 31.3 26.0 - 34.0 pg   MCHC 34.1 30.0 - 36.0 g/dL   RDW 13.4 11.5 - 15.5 %   Platelets 499 (H) 150 - 400 K/uL   nRBC 0.0 0.0 - 0.2 %   Neutrophils Relative % 71 %   Neutro Abs 10.9 (H) 1.7 - 7.7 K/uL   Lymphocytes Relative 17 %   Lymphs Abs 2.6 0.7 - 4.0 K/uL   Monocytes Relative 8 %   Monocytes Absolute 1.3 (H) 0.1 - 1.0 K/uL   Eosinophils Relative 2 %   Eosinophils Absolute 0.2 0.0 - 0.5 K/uL   Basophils Relative 1 %   Basophils Absolute 0.1 0.0 - 0.1 K/uL   Immature  Granulocytes 1 %   Abs Immature Granulocytes 0.10 (H) 0.00 - 0.07 K/uL    Comment: Performed at [facility] [address], [place], [place] [address]  Comprehensive metabolic panel     Status: Abnormal   Collection Time: 06/20/20 12:56 AM  Result Value Ref Range   Sodium 137 135 - 145 mmol/L   Potassium 4.1 3.5 - 5.1 mmol/L   Chloride 103 98 - 111 mmol/L   CO2 25 22 - 32 mmol/L   Glucose, Bld 114 (H) 70 - 99 mg/dL    Comment: Glucose reference range applies only to samples taken after fasting for at least 8 hours.   BUN 20 8 - 23 mg/dL   Creatinine, Ser 1.12 0.61 - 1.24 mg/dL   Calcium 8.7 (L) 8.9 - 10.3 mg/dL   Total Protein 6.8 6.5 - 8.1 g/dL   Albumin 3.0 (L) 3.5 - 5.0 g/dL   AST 41 15 - 41 U/L   ALT 68 (H) 0 - 44 U/L   Alkaline Phosphatase 64 38 - 126 U/L   Total Bilirubin 1.0 0.3 - 1.2 mg/dL   GFR, Estimated >60 >60 mL/min    Comment: (NOTE) Calculated using the CKD-EPI Creatinine Equation (2021)    Anion gap 9 5 - 15    Comment: Performed at [facility], [place] [address], [place], [place] [address]  Glucose, capillary     Status: Abnormal   Collection Time: 06/20/20  4:00 AM  Result Value Ref Range   Glucose-Capillary 124 (H) 70 - 99 mg/dL    Comment: Glucose reference range applies only to samples taken after fasting for at least 8 hours.  Glucose, capillary     Status: Abnormal   Collection Time: 06/20/20  7:40 AM  Result Value Ref Range   Glucose-Capillary 143 (H) 70 - 99 mg/dL    Comment: Glucose reference range applies only to samples taken after fasting for at least 8 hours.  Glucose, capillary     Status: Abnormal   Collection Time: 06/20/20 12:41 PM  Result Value Ref Range   Glucose-Capillary 150 (H) 70 - 99 mg/dL    Comment: Glucose reference range applies only to samples taken after fasting for at least 8 hours.  Glucose, capillary     Status: None   Collection Time: 06/20/20  4:17 PM  Result Value Ref Range   Glucose-Capillary 98 70 - 99  mg/dL    Comment: Glucose reference range applies only to samples taken after fasting for at least 8 hours.  Glucose, capillary     Status: Abnormal   Collection Time: 06/20/20  8:33 PM  Result Value Ref Range   Glucose-Capillary 129 (H) 70 - 99 mg/dL    Comment: Glucose reference range applies only to samples taken after fasting for at least 8 hours.  Glucose, capillary     Status: Abnormal   Collection Time: 06/21/20 12:02 AM  Result Value Ref Range   Glucose-Capillary 151 (H) 70 - 99 mg/dL    Comment: Glucose reference range applies only to samples taken after fasting for at least 8 hours.   Comment 1 Notify RN    Comment 2 Document in Chart   Magnesium     Status: None   Collection Time: 06/21/20  1:44 AM  Result Value Ref Range   Magnesium 2.1 1.7 - 2.4 mg/dL    Comment: Performed at [facility] [address] [place], [place] [address]  Phosphorus     Status: None   Collection Time: 06/21/20  1:44 AM  Result Value Ref Range   Phosphorus 4.0 2.5 - 4.6 mg/dL    Comment: Performed at [facility] [address] [place], [place] [address]  CBC with Differential/Platelet     Status: Abnormal   Collection Time: 06/21/20  1:44 AM  Result Value Ref Range   WBC 13.3 (H) 4.0 - 10.5 K/uL   RBC 4.59 4.22 - 5.81 MIL/uL   Hemoglobin 14.2 13.0 - 17.0 g/dL   HCT 43.0 39 - 52 %   MCV 93.7 80.0 - 100.0 fL   MCH 30.9 26.0 - 34.0 pg   MCHC 33.0 30.0 - 36.0 g/dL   RDW 13.4 11.5 - 15.5 %   Platelets 515 (H) 150 - 400 K/uL   nRBC 0.0 0.0 - 0.2 %   Neutrophils Relative % 63 %   Neutro Abs 8.6 (H) 1.7 - 7.7 K/uL   Lymphocytes Relative 21 %   Lymphs Abs 2.8 0.7 - 4.0 K/uL   Monocytes Relative 11 %   Monocytes Absolute 1.4 (H) 0.1 - 1.0 K/uL   Eosinophils Relative 3 %   Eosinophils Absolute 0.4 0.0 - 0.5 K/uL   Basophils Relative 1 %   Basophils Absolute 0.1 0.0 - 0.1 K/uL   Immature Granulocytes 1 %   Abs Immature Granulocytes 0.10 (H) 0.00 - 0.07 K/uL    Comment:  Performed at [facility], [address], [place], [place] [address]  Comprehensive metabolic panel     Status: Abnormal   Collection Time: 06/21/20  1:44 AM  Result Value Ref Range   Sodium 136 135 - 145 mmol/L   Potassium 4.1 3.5 - 5.1 mmol/L   Chloride 102 98 - 111 mmol/L   CO2 22 22 - 32 mmol/L   Glucose, Bld 138 (H) 70 - 99 mg/dL    Comment: Glucose reference range applies only to samples taken after fasting for at least 8 hours.   BUN 25 (H) 8 - 23 mg/dL   Creatinine, Ser 1.05 0.61 - 1.24 mg/dL   Calcium 8.7 (L) 8.9 - 10.3 mg/dL   Total Protein 7.0 6.5 - 8.1 g/dL   Albumin 3.1 (L) 3.5 - 5.0 g/dL   AST 38 15 - 41 U/L   ALT 63 (H) 0 -  44 U/L   Alkaline Phosphatase 60 38 - 126 U/L   Total Bilirubin 0.9 0.3 - 1.2 mg/dL   GFR, Estimated >60 >60 mL/min    Comment: (NOTE) Calculated using the CKD-EPI Creatinine Equation (2021)    Anion gap 12 5 - 15    Comment: Performed at [facility] [address], [place], [place] [address]  Glucose, capillary     Status: Abnormal   Collection Time: 06/21/20  4:16 AM  Result Value Ref Range   Glucose-Capillary 144 (H) 70 - 99 mg/dL    Comment: Glucose reference range applies only to samples taken after fasting for at least 8 hours.   Comment 1 Notify RN    Comment 2 Document in Chart    DG Chest 2 View  Result Date: 06/20/2020 CLINICAL DATA:  AICD. EXAM: CHEST - 2 VIEW COMPARISON:  Chest x-ray 06/18/2020. FINDINGS: Feeding tube noted with tip over the stomach. AICD noted with lead tips over the right atrium and right ventricle. Prior CABG. Heart size normal. Low lung volumes. No focal infiltrate. No pleural effusion or pneumothorax. IMPRESSION: 1. Feeding tube noted with tip over the stomach. 2. AICD noted with lead tips over the right atrium and right ventricle. Prior CABG. Heart size normal. 3. No acute pulmonary disease.  No pneumothorax. Electronically Signed   By: Marcello Moores  Register   On: 06/20/2020 06:45       Medical  Problem List and Plan: 1. Right side weakness with altered mental status secondary to acute embolic left side infarcts  -patient may  shower  -ELOS/Goals:  2.  Antithrombotics: -DVT/anticoagulation: Bilateral lobar segmental pulmonary emboli. Eliquis as directed  -antiplatelet therapy: Aspirin 81 mg daily 3. Pain Management: Tylenol as needed 4. Mood: Lexapro 10 mg daily  -antipsychotic agents: N/A 5. Neuropsych: This patient is capable of making decisions on his own behalf. 6. Skin/Wound Care: Routine skin checks 7. Fluids/Electrolytes/Nutrition: Routine in and outs with follow-up chemistries 8.  Recurrent VT complicated by cardiogenic shock.  Status post [place] ICD dual-chamber pacemaker placement 06/19/2020.  Continue amiodarone 200 mg twice daily, Entresto 97-103 mg twice daily, digoxin 0.125 mg daily, and Aldactone 25 mg daily, Toprol 25 mg daily 9.  CAD status post CABG 12/15/2012 with recent inferior STEMI 05/03/2020-06/04/2020.  Continue aspirin therapy.  Follow-up cardiology services 10.  Hyperlipidemia.  Lipitor/Zetia 11.  Decreased nutritional support.  Diet has been advanced to regular.  Patient had been receiving 14 hours nutritional support through nasogastric tube.  Follow-up speech therapy    Cathlyn Parsons, PA-C 06/21/2020

## 2020-06-21 NOTE — Progress Notes (Signed)
Approximately at 1050, Pt's A-pacing at 49-50 beats/ min and sustained there for 15 minutes. Pt asymptomatic, VSS, reassessment done. MD aware. Will continue to monitor the pt.  Lavenia Atlas, RN

## 2020-06-21 NOTE — Plan of Care (Signed)
  Problem: Education: Goal: Ability to manage disease process will improve Outcome: Progressing   Problem: Education: Goal: Ability to manage disease process will improve Outcome: Progressing   Problem: Cardiac: Goal: Ability to achieve and maintain adequate cardiopulmonary perfusion will improve Outcome: Progressing   Problem: Education: Goal: Knowledge of General Education information will improve Description: Including pain rating scale, medication(s)/side effects and non-pharmacologic comfort measures Outcome: Progressing   Problem: Health Behavior/Discharge Planning: Goal: Ability to manage health-related needs will improve Outcome: Progressing   Problem: Clinical Measurements: Goal: Ability to maintain clinical measurements within normal limits will improve Outcome: Progressing Goal: Diagnostic test results will improve Outcome: Progressing   Problem: Education: Goal: Knowledge of disease or condition will improve Outcome: Progressing Goal: Knowledge of secondary prevention will improve Outcome: Progressing Goal: Knowledge of patient specific risk factors addressed and post discharge goals established will improve Outcome: Progressing Goal: Individualized Educational Video(s) Outcome: Progressing   Problem: Self-Care: Goal: Verbalization of feelings and concerns over difficulty with self-care will improve Outcome: Progressing   Problem: Nutrition: Goal: Dietary intake will improve Outcome: Progressing

## 2020-06-21 NOTE — Progress Notes (Signed)
Electrophysiology Rounding Note  Patient Name: [name] Date of Encounter: 06/21/2020  Primary Cardiologist: Sanda Klein, MD Electrophysiologist: Dr. Quentin Ore   Subjective   The patient is doing well today.  At this time, the patient denies chest pain, shortness of breath, or any new concerns.  Inpatient Medications    Scheduled Meds: .  stroke: mapping our early stages of recovery book   Does not apply Once  . amiodarone  200 mg Per Tube BID  . apixaban  5 mg Oral BID  . aspirin EC  81 mg Oral Daily  . atorvastatin  80 mg Per Tube q1800  . chlorhexidine  15 mL Mouth Rinse BID  . Chlorhexidine Gluconate Cloth  6 each Topical Daily  . digoxin  0.125 mg Per Tube Daily  . escitalopram  10 mg Per Tube Daily  . ezetimibe  10 mg Per Tube Daily  . feeding supplement  237 mL Oral TID BM  . feeding supplement (PROSource TF)  45 mL Per Tube QID  . hydrALAZINE  10 mg Per Tube Q8H  . mouth rinse  15 mL Mouth Rinse q12n4p  . metoprolol succinate  25 mg Oral Daily  . sacubitril-valsartan  1 tablet Per Tube BID  . sodium chloride flush  3 mL Intravenous Q12H  . spironolactone  25 mg Per Tube Daily   Continuous Infusions: . feeding supplement (OSMOLITE 1.5 CAL) 1,000 mL (06/21/20 0255)   PRN Meds: acetaminophen (TYLENOL) oral liquid 160 mg/5 mL, albuterol, guaiFENesin, loperamide HCl, nitroGLYCERIN, ondansetron (ZOFRAN) IV   Vital Signs    Vitals:   06/20/20 1622 06/20/20 2035 06/21/20 0005 06/21/20 0420  BP: 131/88 126/81 119/85 135/88  Pulse: 71 64 68 75  Resp: 18 19 20 20   Temp: 98.5 F (36.9 C) 98.2 F (36.8 C) 97.7 F (36.5 C) 98.4 F (36.9 C)  TempSrc: Oral Oral Oral Oral  SpO2: 96% 100% 96% 96%  Weight:    84.1 kg  Height:        Intake/Output Summary (Last 24 hours) at 06/21/2020 0851 Last data filed at 06/21/2020 0500 Gross per 24 hour  Intake 1181.75 ml  Output 800 ml  Net 381.75 ml   Filed Weights   06/19/20 0359 06/20/20 0549 06/21/20 0420   Weight: 86.9 kg 84.5 kg 84.1 kg    Physical Exam    GEN- The patient is well appearing, alert and oriented x 3 today.   Head- normocephalic, atraumatic Eyes-  Sclera clear, conjunctiva pink Ears- hearing intact Oropharynx- clear Neck- supple Lungs- Clear to ausculation bilaterally, normal work of breathing Heart- Regular rate and rhythm, no murmurs, rubs or gallops GI- soft, NT, ND, + BS Extremities- no clubbing or cyanosis. No edema Skin- no rash or lesion Psych- euthymic mood, full affect Neuro- strength and sensation are intact  Labs    CBC Recent Labs    06/20/20 0056 06/21/20 0144  WBC 15.2* 13.3*  NEUTROABS 10.9* 8.6*  HGB 14.0 14.2  HCT 41.0 43.0  MCV 91.7 93.7  PLT 499* 825*   Basic Metabolic Panel Recent Labs    06/20/20 0056 06/21/20 0144  NA 137 136  K 4.1 4.1  CL 103 102  CO2 25 22  GLUCOSE 114* 138*  BUN 20 25*  CREATININE 1.12 1.05  CALCIUM 8.7* 8.7*  MG 2.1 2.1  PHOS 3.6 4.0   Liver Function Tests Recent Labs    06/20/20 0056 06/21/20 0144  AST 41 38  ALT 68* 63*  ALKPHOS 64 60  BILITOT 1.0 0.9  PROT 6.8 7.0  ALBUMIN 3.0* 3.1*   No results for input(s): LIPASE, AMYLASE in the last 72 hours. Cardiac Enzymes No results for input(s): CKTOTAL, CKMB, CKMBINDEX, TROPONINI in the last 72 hours.   Telemetry    NSR 60-70s (personally reviewed)  Radiology    DG Chest 2 View  Result Date: 06/20/2020 CLINICAL DATA:  AICD. EXAM: CHEST - 2 VIEW COMPARISON:  Chest x-ray 06/18/2020. FINDINGS: Feeding tube noted with tip over the stomach. AICD noted with lead tips over the right atrium and right ventricle. Prior CABG. Heart size normal. Low lung volumes. No focal infiltrate. No pleural effusion or pneumothorax. IMPRESSION: 1. Feeding tube noted with tip over the stomach. 2. AICD noted with lead tips over the right atrium and right ventricle. Prior CABG. Heart size normal. 3. No acute pulmonary disease.  No pneumothorax. Electronically Signed    By: Marcello Moores  Register   On: 06/20/2020 06:45    Patient Profile     64 y.o.malewith aPMH of CAD (s/p CABG in 2014 with LIMA-LAD, SVG-OM2 and SVG-RCA, s/p angioplasty to proximal SVG-RCA and DES to SVG-RCA anastomosisin 03/2019), chronic combined systolic and diastolic CHF/ICM (EF 04-59% by echo in 06/2019), HTN, HLD,prior CVAand recent STEMIwith angioplasty of the distal bypass graft10/21/21  EMS [place]ope at [place] by EMS [place] treated with amio bolus' repeat bolus in ER >> asystole >> external pacing>> emergent cath/temp wire placement, intubated sedated.  Assessment & Plan    1.VTarrest -> asystole S/p St. Jude Dual chamber ICD 06/19/2020 Continue po amiodarone. ICD site stable under pressure dressing. Will re-apply for extra layer of protection while in hospital and restarting [place]  2.Chronic systolic HF with biventricular failure ->cardiogenic shock  -Echo10/21/21 EF 25%. Moderate RV to severe RV dysfunction and possible LV thrombus. LifeVest placed.Previously d/c'd home on 05/06/20 -RHCthis admissionwith preserved output and well compensated filling pressures. On dig, entresto, aldactone. Appreciate HF team input.  - Continue Toprol 25 mg daily   3. CAD s/p CABG  - Inferior STEMI 05/03/20 s/p POBA SVG->PDA (felt 2/2 missed brilinta doses) - Emergent cath 11/21/21with stable coronary anatomy - on DAPT/statin/zetia - Stable  4. Pulmonary embolism Continue Eliquis 5 mg BID with recent ICD.   Disposition:  CIR pending.   For questions or updates, please contact [facility] Please consult [email or web address] for contact info under Cardiology/STEMI.  Signed, Shirley Friar, PA-C  06/21/2020, 8:51 AM

## 2020-06-21 NOTE — Progress Notes (Signed)
Physical Therapy Treatment Patient Details Name: [name] MRN: [id number] DOB: 1956/06/10 Today's Date: 06/21/2020    History of Present Illness 64 yo with a hx as stated above who presented [facility] after having a pre-syncopal symptoms including dizzinezss while at church earlier today found to be in VT (not wearing Life Vest as prescribed on recent hospital discharge) on EMS arrival. Has not been compliant with LifeVest stating the battery pack too bulky. PMH of CAD (s/p CABG in 2014 with LIMA-LAD, SVG-OM2 and SVG-RCA, s/p angioplasty to proximal SVG-RCA and DES to SVG-RCA anastomosis in 03/2019), chronic combined systolic and diastolic CHF/ICM (EF 16-38% by echo in 06/2019), HTN, HLD, prior CVA and recent STEMI with angioplasty of the distal bypass graft 05/04/20. Admitted 06/04/20 for treatment of wide complex tachycardia. Intubated 11/21-Extubated 11/23 and reintubated extubated 11/26 R radial aline 11/23 TEE 11/24: EF 30-35% no evidence of LV clot.  11/30 pt noted to have increased R sided weakness by OT, code stroke called MRI revealed Small acute infarcts involving the left frontal precentral gyrus, ICD placed 06/19/20    PT Comments    On entry pt with sandwich in front of him with one bite taken. Pt relates frustration with cortrak in nose and reports he knows he needs to eat more if he wants it removed. Pt states "nothing tastes good." Does report he is drinking his Ensures. Pt is min guard for transfers and ambulation. Worked on coordination exercises on R. D/c plans remain appropriate. PT will continue to follow acutely.     Follow Up Recommendations  CIR     Equipment Recommendations  Rolling walker with 5" wheels    Recommendations for Other Services OT consult     Precautions / Restrictions Precautions Precautions: Fall;ICD/Pacemaker Precaution Comments: Cortrak Required Braces or Orthoses:  (sling off on entry ) Restrictions Weight Bearing Restrictions:  No RLE Weight Bearing: Non weight bearing    Mobility  Bed Mobility               General bed mobility comments: sitting EoB on entry   Transfers Overall transfer level: Needs assistance Equipment used: 1 person hand held assist Transfers: Sit to/from Stand Sit to Stand: Min guard         General transfer comment: min guard for safety, reminder to not use L UE to push up    Ambulation/Gait Ambulation/Gait assistance: Min assist Gait Distance (Feet): 470 Feet Assistive device: Rolling walker (2 wheeled) Gait Pattern/deviations: Decreased step length - left;Decreased step length - right;Trunk flexed;Step-through pattern;Shuffle Gait velocity: slowed Gait velocity interpretation: <1.8 ft/sec, indicate of risk for recurrent falls General Gait Details: min A for steadying with RW, vc for proximity to RW and upright posture      Modified Rankin (Stroke Patients Only) Modified Rankin (Stroke Patients Only) Pre-Morbid Rankin Score: No symptoms Modified Rankin: Moderately severe disability     Balance Overall balance assessment: Needs assistance Sitting-balance support: Feet supported;No upper extremity supported Sitting balance-Leahy Scale: Fair     Standing balance support: During functional activity;Single extremity supported Standing balance-Leahy Scale: Poor Standing balance comment: reliant on external support                            Cognition Arousal/Alertness: Awake/alert Behavior During Therapy: WFL for tasks assessed/performed   Area of Impairment: Following commands;Awareness;Problem solving;Safety/judgement  Memory: Decreased short-term memory Following Commands: Follows multi-step commands with increased time Safety/Judgement: Decreased awareness of safety;Decreased awareness of deficits Awareness: Emergent Problem Solving: Difficulty sequencing;Requires verbal cues;Requires tactile cues General Comments:  continues to need increased cuing for sequencing       Exercises Other Exercises Other Exercises: R UE coornination exercises     General Comments General comments (skin integrity, edema, etc.): VSS on RA, HR in 60s with ambulation       Pertinent Vitals/Pain Pain Assessment: No/denies pain           PT Goals (current goals can now be found in the care plan section) Acute Rehab PT Goals Patient Stated Goal: go home  PT Goal Formulation: With patient Time For Goal Achievement: 06/26/20 Potential to Achieve Goals: Good Progress towards PT goals: Progressing toward goals    Frequency    Min 4X/week      PT Plan Frequency needs to be updated       AM-PAC PT "6 Clicks" Mobility   Outcome Measure  Help needed turning from your back to your side while in a flat bed without using bedrails?: None Help needed moving from lying on your back to sitting on the side of a flat bed without using bedrails?: A Little Help needed moving to and from a bed to a chair (including a wheelchair)?: A Little Help needed standing up from a chair using your arms (e.g., wheelchair or bedside chair)?: A Little Help needed to walk in hospital room?: A Lot Help needed climbing 3-5 steps with a railing? : A Lot 6 Click Score: 17    End of Session Equipment Utilized During Treatment: Gait belt Activity Tolerance: Patient tolerated treatment well Patient left: in chair;with call bell/phone within reach;with chair alarm set Nurse Communication: Mobility status PT Visit Diagnosis: Unsteadiness on feet (R26.81);Other abnormalities of gait and mobility (R26.89);Muscle weakness (generalized) (M62.81);Difficulty in walking, not elsewhere classified (R26.2)     Time: 0932-3557 PT Time Calculation (min) (ACUTE ONLY): 21 min  Charges:  $Therapeutic Exercise: 8-22 mins                     Amera Banos B. Migdalia Dk PT, DPT Acute Rehabilitation Services Pager [phone] Office [phone]    [place] 06/21/2020, 3:56 PM

## 2020-06-22 LAB — COMPREHENSIVE METABOLIC PANEL
ALT: 68 U/L — ABNORMAL HIGH (ref 0–44)
AST: 55 U/L — ABNORMAL HIGH (ref 15–41)
Albumin: 3.1 g/dL — ABNORMAL LOW (ref 3.5–5.0)
Alkaline Phosphatase: 63 U/L (ref 38–126)
Anion gap: 8 (ref 5–15)
BUN: 34 mg/dL — ABNORMAL HIGH (ref 8–23)
CO2: 26 mmol/L (ref 22–32)
Calcium: 9 mg/dL (ref 8.9–10.3)
Chloride: 101 mmol/L (ref 98–111)
Creatinine, Ser: 1.17 mg/dL (ref 0.61–1.24)
GFR, Estimated: >60 mL/min (ref >60)
Glucose, Bld: 112 mg/dL — ABNORMAL HIGH (ref 70–99)
Potassium: 5 mmol/L (ref 3.5–5.1)
Sodium: 135 mmol/L (ref 135–145)
Total Bilirubin: 1 mg/dL (ref 0.3–1.2)
Total Protein: 7 g/dL (ref 6.5–8.1)

## 2020-06-22 LAB — GLUCOSE, CAPILLARY
Glucose-Capillary: 125 mg/dL — ABNORMAL HIGH (ref 70–99)
Glucose-Capillary: 142 mg/dL — ABNORMAL HIGH (ref 70–99)
Glucose-Capillary: 126 mg/dL — ABNORMAL HIGH (ref 70–99)
Glucose-Capillary: 112 mg/dL — ABNORMAL HIGH (ref 70–99)
Glucose-Capillary: 107 mg/dL — ABNORMAL HIGH (ref 70–99)
Glucose-Capillary: 91 mg/dL (ref 70–99)

## 2020-06-22 LAB — CBC WITH DIFFERENTIAL/PLATELET
Abs Immature Granulocytes: 0.12 10*3/uL — ABNORMAL HIGH (ref 0.00–0.07)
Basophils Absolute: 0.1 10*3/uL (ref 0.0–0.1)
Basophils Relative: 1 %
Eosinophils Absolute: 0.4 10*3/uL (ref 0.0–0.5)
Eosinophils Relative: 3 %
HCT: 43.9 % (ref 39.0–52.0)
Hemoglobin: 14.3 g/dL (ref 13.0–17.0)
Immature Granulocytes: 1 %
Lymphocytes Relative: 18 %
Lymphs Abs: 2.6 10*3/uL (ref 0.7–4.0)
MCH: 30.4 pg (ref 26.0–34.0)
MCHC: 32.6 g/dL (ref 30.0–36.0)
MCV: 93.2 fL (ref 80.0–100.0)
Monocytes Absolute: 1.8 10*3/uL — ABNORMAL HIGH (ref 0.1–1.0)
Monocytes Relative: 13 %
Neutro Abs: 9.2 10*3/uL — ABNORMAL HIGH (ref 1.7–7.7)
Neutrophils Relative %: 64 %
Platelets: 542 10*3/uL — ABNORMAL HIGH (ref 150–400)
RBC: 4.71 MIL/uL (ref 4.22–5.81)
RDW: 13.3 % (ref 11.5–15.5)
WBC: 14.2 10*3/uL — ABNORMAL HIGH (ref 4.0–10.5)
nRBC: 0 % (ref 0.0–0.2)

## 2020-06-22 LAB — URINALYSIS, ROUTINE W REFLEX MICROSCOPIC
Bacteria, UA: NONE SEEN
Bilirubin Urine: NEGATIVE
Glucose, UA: NEGATIVE mg/dL
Hgb urine dipstick: NEGATIVE
Ketones, ur: NEGATIVE mg/dL
Leukocytes,Ua: NEGATIVE
Nitrite: NEGATIVE
Protein, ur: 30 mg/dL — AB
Specific Gravity, Urine: 1.018 (ref 1.005–1.030)
pH: 5 (ref 5.0–8.0)

## 2020-06-22 LAB — MAGNESIUM: Magnesium: 2.1 mg/dL (ref 1.7–2.4)

## 2020-06-22 LAB — PHOSPHORUS: Phosphorus: 3.9 mg/dL (ref 2.5–4.6)

## 2020-06-22 MED ORDER — PROSOURCE PLUS PO LIQD
30.0000 mL | Freq: Two times a day (BID) | ORAL | Status: DC
  Administered 2020-06-23 – 2020-06-25 (×5): 30 mL via ORAL
  Filled 2020-06-22 (×5): qty 30

## 2020-06-22 MED ORDER — ENSURE ENLIVE PO LIQD
237.0000 mL | Freq: Four times a day (QID) | ORAL | Status: DC
  Administered 2020-06-22: 237 mL via ORAL
  Administered 2020-06-22: 330 mL via ORAL
  Administered 2020-06-23 (×3): 237 mL via ORAL

## 2020-06-22 NOTE — Progress Notes (Signed)
Calorie Count Note  48 hour calorie count ordered.  Diet: Regular  Supplements: Ensure Enlive po TID + Magic Cup TID  Day 2- 12/8 Breakfast: 277 kcal, 10 g protein Lunch: 200 kcal 5 g protein Dinner: 100 kcal 3 g protein Supplements: 860 kcal 46g protein  Total intake: 1437 kcal (72% of minimum estimated needs)  64 protein (58% of minimum estimated needs)  Estimated Nutritional Needs:  Kcal:  2000-2200 kcals Protein:  110-125 g Fluid:  >/= 2 L  Nutrition Dx: Inadequate oral intake related to inability to eat as evidenced by NPO status- Diet advanced   Goal: Patient will meet greater than or equal to 90% of their needs- progressing PO  Intervention:    Increase Ensure Enlive po QID, each supplement provides 350 kcal and 20 grams of protein  Add 30 ml ProSource Plus BID, each supplement provides 100 kcals and 15 grams protein.   Magic cup TID with meals, each supplement provides 290 kcal and 9 grams of protein  Meal intake slow to progress. Supplement intake increasing. Per RN, pt taking at least 2-3 Ensure daily and willing to take Prosource PO. Will hold on nocturnal tube feeding tonight and if intake shows progression recommend pulling Cortrak.   Mariana Single RD, LDN Clinical Nutrition Pager listed in [place]

## 2020-06-22 NOTE — Progress Notes (Signed)
Inpatient Rehab Admissions Coordinator:   I met this patient to further discuss CIR admission. I do not have a bed available for pt. Today. Pt. Remains unsure of if he wants to come to CIR, insisting he'd rather go home; however, his wife would rather he come to rehab and try to progress to not needing Coretrak/PEG. They were encouraged to talk it over and [facility] team will follow up tomorrow.   Clemens Catholic, [place], [place] Admissions Coordinator  [phone] ([place]) [phone] (office)

## 2020-06-22 NOTE — Progress Notes (Signed)
Electrophysiology Rounding Note  Patient Name: [name] Date of Encounter: 06/22/2020  Primary Cardiologist: Sanda Klein, MD Electrophysiologist: Dr. Quentin Ore   Subjective   The patient is doing well today.  At this time, the patient denies chest pain, shortness of breath, or any new concerns.  Site remains stable with Pressure dressing in place. Continue at least 24 more hours.   Inpatient Medications    Scheduled Meds: .  stroke: mapping our early stages of recovery book   Does not apply Once  . amiodarone  200 mg Per Tube BID  . apixaban  5 mg Oral BID  . aspirin EC  81 mg Oral Daily  . atorvastatin  80 mg Per Tube q1800  . chlorhexidine  15 mL Mouth Rinse BID  . Chlorhexidine Gluconate Cloth  6 each Topical Daily  . clopidogrel  75 mg Oral Daily  . digoxin  0.125 mg Per Tube Daily  . escitalopram  10 mg Per Tube Daily  . ezetimibe  10 mg Per Tube Daily  . feeding supplement  237 mL Oral TID BM  . feeding supplement (PROSource TF)  45 mL Per Tube QID  . hydrALAZINE  10 mg Per Tube Q8H  . mouth rinse  15 mL Mouth Rinse q12n4p  . metoprolol succinate  25 mg Oral Daily  . sacubitril-valsartan  1 tablet Per Tube BID  . sodium chloride flush  3 mL Intravenous Q12H  . spironolactone  25 mg Per Tube Daily   Continuous Infusions: . feeding supplement (OSMOLITE 1.5 CAL) 1,000 mL (06/21/20 1726)   PRN Meds: acetaminophen (TYLENOL) oral liquid 160 mg/5 mL, albuterol, guaiFENesin, loperamide HCl, nitroGLYCERIN, ondansetron (ZOFRAN) IV   Vital Signs    Vitals:   06/21/20 2025 06/21/20 2327 06/22/20 0425 06/22/20 0818  BP: 123/79 110/87 (!) 134/94 120/70  Pulse: (!) 53 (!) 55 63 70  Resp: 20 20 20 20   Temp: 97.6 F (36.4 C) 97.6 F (36.4 C) 97.6 F (36.4 C) 98.2 F (36.8 C)  TempSrc: Oral Oral Oral Oral  SpO2: 99% 98% 96% 97%  Weight:   84.6 kg   Height:        Intake/Output Summary (Last 24 hours) at 06/22/2020 1033 Last data filed at 06/22/2020  0819 Gross per 24 hour  Intake 1879.5 ml  Output 950 ml  Net 929.5 ml   Filed Weights   06/20/20 0549 06/21/20 0420 06/22/20 0425  Weight: 84.5 kg 84.1 kg 84.6 kg    Physical Exam    GEN- The patient is well appearing, alert and oriented x 3 today.   Head- normocephalic, atraumatic Eyes-  Sclera clear, conjunctiva pink Ears- hearing intact Oropharynx- clear Neck- supple Lungs- Clear to ausculation bilaterally, normal work of breathing Heart- Regular rate and rhythm, no murmurs, rubs or gallops GI- soft, NT, ND, + BS Extremities- no clubbing or cyanosis. No edema Skin- no rash or lesion Psych- euthymic mood, full affect Neuro- strength and sensation are intact  Labs    CBC Recent Labs    06/21/20 0144 06/22/20 0125  WBC 13.3* 14.2*  NEUTROABS 8.6* 9.2*  HGB 14.2 14.3  HCT 43.0 43.9  MCV 93.7 93.2  PLT 515* 161*   Basic Metabolic Panel Recent Labs    06/21/20 0144 06/22/20 0125  NA 136 135  K 4.1 5.0  CL 102 101  CO2 22 26  GLUCOSE 138* 112*  BUN 25* 34*  CREATININE 1.05 1.17  CALCIUM 8.7* 9.0  MG 2.1 2.1  PHOS 4.0 3.9   Liver Function Tests Recent Labs    06/21/20 0144 06/22/20 0125  AST 38 55*  ALT 63* 68*  ALKPHOS 60 63  BILITOT 0.9 1.0  PROT 7.0 7.0  ALBUMIN 3.1* 3.1*   No results for input(s): LIPASE, AMYLASE in the last 72 hours. Cardiac Enzymes No results for input(s): CKTOTAL, CKMB, CKMBINDEX, TROPONINI in the last 72 hours.   Telemetry    50-70s (personally reviewed)  Radiology    No results found.  Patient Profile     64 y.o.malewith aPMH of CAD (s/p CABG in 2014 with LIMA-LAD, SVG-OM2 and SVG-RCA, s/p angioplasty to proximal SVG-RCA and DES to SVG-RCA anastomosisin 03/2019), chronic combined systolic and diastolic CHF/ICM (EF 46-80% by echo in 06/2019), HTN, HLD,prior CVAand recent STEMIwith angioplasty of the distal bypass graft10/21/21  [place] treated with  amio bolus' repeat bolus in ER >> asystole >> external pacing>> emergent cath/temp wire placement, intubated sedated.   Assessment & Plan    1.VTarrest -> asystole S/p St. Jude Dual chamber ICD 06/19/2020 Continue po amiodarone ICD site stable. Continue pressure dressing for at least 24 more hours.   2.Chronic systolic HF with biventricular failure ->cardiogenic shock  -Echo10/21/21 EF 25%. Moderate RV to severe RV dysfunction and possible LV thrombus. LifeVest placed.Previously d/c'd home on 05/06/20 -RHCthis admissionwith preserved output and well compensated filling pressures. On dig, entresto, aldactone. Appreciate HF team input. - Continue Toprol 25 mg daily  - Stable.   3. CAD s/p CABG  - Inferior STEMI 05/03/20 s/p POBA SVG->PDA (felt 2/2 missed brilinta doses) - Emergent cath 11/21/21with stable coronary anatomy - on DAPT/statin/zetia - Stable.   4. Pulmonary embolism Continue Eliquis 5 mg BID with recent ICD.  Also on ASA/Plavix  Disposition:  CIR pending.  For questions or updates, please contact [place] Please consult [email or web address] for contact info under Cardiology/STEMI.  Signed, Shirley Friar, PA-C  06/22/2020, 10:33 AM

## 2020-06-22 NOTE — Progress Notes (Signed)
Physical Therapy Treatment Patient Details Name: [name] MRN: [id number] DOB: [birth year] Today's Date: 06/22/2020    History of Present Illness 64 yo with a hx as stated above who presented to [facility] 06/04/20 after having a pre-syncopal symptoms including dizzinezss while at church earlier today found to be in VT (not wearing Life Vest as prescribed on recent hospital discharge) on EMS arrival. Has not been compliant with LifeVest stating the battery pack too bulky. PMH of CAD (s/p CABG in 2014 with LIMA-LAD, SVG-OM2 and SVG-RCA, s/p angioplasty to proximal SVG-RCA and DES to SVG-RCA anastomosis in 03/2019), chronic combined systolic and diastolic CHF/ICM (EF 53-97% by echo in 06/2019), HTN, HLD, prior CVA and recent STEMI with angioplasty of the distal bypass graft 05/04/20. Admitted 06/04/20 for treatment of wide complex tachycardia. Intubated 11/21-Extubated 11/23 and reintubated extubated 11/26 R radial aline 11/23 TEE 11/24: EF 30-35% no evidence of LV clot.  11/30 pt noted to have increased R sided weakness by OT, code stroke called MRI revealed Small acute infarcts involving the left frontal precentral gyrus, ICD placed 06/19/20    PT Comments    Pt supine in bed on arrival this session.  Pt able to follow ICD precautions and mobilize with cues from therapist.  Progressed to stair training but continues to require min assistance for all dynamic gt activities.  Continue to recommend post acute rehab.     Follow Up Recommendations  CIR     Equipment Recommendations  Rolling walker with 5" wheels    Recommendations for Other Services       Precautions / Restrictions Precautions Precautions: Fall;ICD/Pacemaker Precaution Comments: Cortrak Required Braces or Orthoses:  (sling off on entry) Restrictions Weight Bearing Restrictions: Yes LUE Weight Bearing:  (pacemaker precautions)    Mobility  Bed Mobility Overal bed mobility: Needs Assistance     Sidelying to sit: HOB  elevated;Min guard          Transfers Overall transfer level: Needs assistance Equipment used: Rolling walker (2 wheeled) Transfers: Sit to/from Stand           General transfer comment: Cues for hand placement and not to push with L UE into standing.  Ambulation/Gait Ambulation/Gait assistance: Min assist;Min guard Gait Distance (Feet): 450 Feet Assistive device: Rolling walker (2 wheeled) Gait Pattern/deviations: Decreased step length - left;Decreased step length - right;Trunk flexed;Step-through pattern;Shuffle Gait velocity: slowed   General Gait Details: min A for steadying with RW, vc for proximity to RW and upright posture   Stairs Stairs: Yes Stairs assistance: Min assist Stair Management: One rail Right Number of Stairs: 5 General stair comments: Mild unsteadiness min assistance to maintain balance.   Wheelchair Mobility    Modified Rankin (Stroke Patients Only) Modified Rankin (Stroke Patients Only) Pre-Morbid Rankin Score: No symptoms Modified Rankin: Moderately severe disability     Balance Overall balance assessment: Needs assistance Sitting-balance support: Feet supported;No upper extremity supported Sitting balance-Leahy Scale: Fair       Standing balance-Leahy Scale: Poor                              Cognition Arousal/Alertness: Awake/alert Behavior During Therapy: Flat affect Overall Cognitive Status: Impaired/Different from baseline Area of Impairment: Following commands;Problem solving;Safety/judgement                       Following Commands: Follows multi-step commands with increased time Safety/Judgement: Decreased awareness of safety;Decreased awareness of deficits  Problem Solving: Difficulty sequencing;Requires verbal cues;Requires tactile cues General Comments: continues to need increased cuing for sequencing       Exercises      General Comments        Pertinent Vitals/Pain Pain Assessment:  Faces Faces Pain Scale: No hurt    Home Living                      Prior Function            PT Goals (current goals can now be found in the care plan section) Acute Rehab PT Goals Patient Stated Goal: go home  Potential to Achieve Goals: Good Progress towards PT goals: Progressing toward goals    Frequency    Min 4X/week      PT Plan Current plan remains appropriate    Co-evaluation              AM-PAC PT "6 Clicks" Mobility   Outcome Measure  Help needed turning from your back to your side while in a flat bed without using bedrails?: None Help needed moving from lying on your back to sitting on the side of a flat bed without using bedrails?: A Little Help needed moving to and from a bed to a chair (including a wheelchair)?: A Little Help needed standing up from a chair using your arms (e.g., wheelchair or bedside chair)?: A Little Help needed to walk in hospital room?: A Lot Help needed climbing 3-5 steps with a railing? : A Lot 6 Click Score: 17    End of Session Equipment Utilized During Treatment: Gait belt Activity Tolerance: Patient tolerated treatment well Patient left: with call bell/phone within reach;with chair alarm set;in bed Nurse Communication: Mobility status PT Visit Diagnosis: Unsteadiness on feet (R26.81);Other abnormalities of gait and mobility (R26.89);Muscle weakness (generalized) (M62.81);Difficulty in walking, not elsewhere classified (R26.2)     Time: 8341-9622 PT Time Calculation (min) (ACUTE ONLY): 17 min  Charges:  $Gait Training: 8-22 mins                     Erasmo Leventhal , PTA Acute Rehabilitation Services Pager [phone] Office [phone]     Gracelin Weisberg Eli Hose 06/22/2020, 3:43 PM

## 2020-06-22 NOTE — Progress Notes (Signed)
  Speech Language Pathology Treatment: Dysphagia  Patient Details Name: [name] MRN: [id number] DOB: 02-11-1956 Today's Date: 06/22/2020 Time: 7893-8101 SLP Time Calculation (min) (ACUTE ONLY): 11 min  Assessment / Plan / Recommendation Clinical Impression  Pt was seen with regular solids and thin liquids, with no overt s/s of aspiration and no subjective c/o dysphagia. While eating bites from his fruit cup there was a single piece of fruit that was particularly hard, and he demonstrated good safety awareness by attempting to masticate it more fully and ultimately making the decision that he did not want to swallow that piece, spitting it back out into a napkin. Pt will benefit from ongoing cognitive therapy more so than swallowing. Would continue with regular solids and thin liquids, removing Cortrak as able to try to facilitate more oral intake.    HPI HPI: 43 yoM with extensive cardiac history with recent STEMI 04/2020, D/Cd home on 10/23 with Lifevest, per notes was not using. Admitted 11/21 with VT arrest, Acute on Chronic systolic HF with biventricular failure -> cardiogenic shock . Intubated and emergently taken to cath lab for temporary transvenous pacemaker and coronary angiogram.  Extubated 11/23 then reintubated four hours later, extubated 11/26.      SLP Plan  Goals updated       Recommendations  Diet recommendations: Regular;Thin liquid Liquids provided via: Cup;Straw Medication Administration: Whole meds with liquid Supervision: Patient able to self feed Compensations: Slow rate;Small sips/bites Postural Changes and/or Swallow Maneuvers: Seated upright 90 degrees                Oral Care Recommendations: Oral care BID Follow up Recommendations: Inpatient Rehab SLP Visit Diagnosis: Dysphagia, pharyngeal phase (R13.13) Plan: Goals updated       GO                [name]., M.A. [facility] Acute Rehabilitation Services Pager [phone] Office  [phone]  06/22/2020, 10:23 AM

## 2020-06-22 NOTE — Plan of Care (Signed)
  Problem: Education: Goal: Ability to manage disease process will improve Outcome: Progressing   Problem: Cardiac: Goal: Ability to achieve and maintain adequate cardiopulmonary perfusion will improve Outcome: Progressing   Problem: Education: Goal: Knowledge of General Education information will improve Description: Including pain rating scale, medication(s)/side effects and non-pharmacologic comfort measures Outcome: Progressing   Problem: Health Behavior/Discharge Planning: Goal: Ability to manage health-related needs will improve Outcome: Progressing   Problem: Clinical Measurements: Goal: Ability to maintain clinical measurements within normal limits will improve Outcome: Progressing Goal: Diagnostic test results will improve Outcome: Progressing   Problem: Education: Goal: Knowledge of disease or condition will improve Outcome: Progressing Goal: Knowledge of secondary prevention will improve Outcome: Progressing Goal: Knowledge of patient specific risk factors addressed and post discharge goals established will improve Outcome: Progressing Goal: Individualized Educational Video(s) Outcome: Progressing   Problem: Self-Care: Goal: Verbalization of feelings and concerns over difficulty with self-care will improve Outcome: Progressing   Problem: Nutrition: Goal: Dietary intake will improve Outcome: Progressing

## 2020-06-22 NOTE — Progress Notes (Signed)
PROGRESS NOTE    [name]  JOI:[id number] DOB: 04-Oct-1955 DOA: 06/04/2020 PCP: [name], MD     Brief Narrative:  64 yo WM PMHx CAD w/ previous CABG in 2014, chronic combined systolic and diastolic HF/ ICM, HTN, HLD, CVA, and recent inferior STEMI 05/03/20 secondary to thrombotic occlusion of the SVG to the RCA, likely due to missed ticagrelor doses.  EF noted to be 25% with moderate RV to severe RV dysfunction and possible LV thrombus 05/04/2020.  He was discharged home on 10/23 on a LifeVest, but apparently not using. On 11/21, he developed dizziness at church. EMS found patient in wide complex tachycardia treated with amiodarone and lidocaine with successful conversion to NSR.  On arrival to ER, patient had recurrence of his VT but hemodynamically tolerating.  He was given additional amiodarone and lidocaine gtt started, however developed severe bradycardia requiring atropine and external pacing.  He was intubated and taken emergently for temporary transvenous pacemaker and coronary angiogram. Patient admitted for further management   Subjective: Patient still with poor appetite, denies any new complaints   Assessment & Plan:   Active Problems:   Respiratory failure with hypoxia (HCC)   Ventricular tachycardia (HCC)   Cardiogenic shock (HCC)   Acute stroke due to ischemia [facility])   Acute pulmonary embolism without acute cor pulmonale (HCC)   Cardiac defibrillator in situ   Pressure injury of skin   Recurrent VT/bradycardia S/p temporary pacing wire (removed 11/27), now with [place] dual chamber ICD on 06/19/2020 Continue p.o. amiodarone, Toprol, digoxin EP team following  Cardiogenic shock/chronic systolic and diastolic CHF with biventricular failure Continue digoxin, Entresto, Aldactone, hydralazine, metoprolol, aspirin Cardiology/HF on board Continue strict I's and O's, daily weights  CAD s/p CABG with recent inferior STEMI 04/2020 Management as  above  Acute CVA MRI showed infarcts involving the left frontal precentral gyrus and the juxtacortical left frontal white matter, mild associated edema without mass-effect Continue Lipitor, aspirin, Plavix  Bilateral lobar and segmental PE/bilateral LE DVT Continue Eliquis  Dysphagia Improving, still with poor appetite Advance to regular diet, but still has core track in place pending improved calorie counts SLP on board Aspiration precautions  HTN Continue Aldactone, hydralazine  HLD Continue Lipitor 80 mg daily   Leukocytosis ?Possible HCAP Chest x-ray negative for any pneumonia UA, UC pending Completed 7 days of Cefepime 11/29; cultures negative/ normal flora; continue to monitor clinically  Encephalopathy , Acute CVA-Embolic L infarcts Encephalopathy resolved Echocardiogram with bubble; negative for PFO see results below Neurology consulted  Deconditioning PT/OT  Mildly elevated liver enzymes Per patient and wife has significant history within his family of liver disease, some of it EtOH related.  Patient does not drink. Acute hepatitis panel negative, 12/3 US abdomen liver; negative Daily CMP   DVT prophylaxis: Eliquis Code Status: Full Family Communication: None at bedside  Status is: Inpatient    Dispo: The patient is from: Home              Anticipated d/c is to: CIR              Anticipated d/c date is: Once bed available in CIR              Patient currently unstable      Consultants:  HF tm PCCM EP Neurology   Procedures/Significant Events:  11/21 Admitted cardiology/ intubated/ L/RHC, TVP, PA catheter 11/22  Remains on NE, amio, heparin gtt, decreased UOP.  CVP 8, CI 1.34, co-ox 60-66%,  started on milrinone 11/23 Tmax 101.7, w/ increased respiratory secretions prompted the initiation of antibiotics. co-ox 72%, CVP 12, CI 3.2, SVR 1090. Able to titrate off sedation and decrease NE; extubated 14:22 then re-intubated at 18:38.    11/21  R/LHC >   Severe 3 vessel occlusive CAD. 2. Patent LIMA to the LAD 3. Patent SVG to OM1 4. Patent SVG to RCA. Much improved flow from prior PCI. 5. Mildly elevated LVEDP 17 mm Hg 6. Successful placement of temporary transvenous pacemaker.  11/24 TEE >> neg for LV thrombus; EF 30-35%.Inferolateral HK  11/30 CT head>> 1. No acute abnormality. Generalized atrophy 2. Hypodensity right posterior cerebellum most consistent with chronic infarct, not present in 2020. 3. ASPECTS is 10  11/30 CTA head/ neck/ perfusion >> 1. No emergent large vessel occlusion or infarct/ischemia by perfusion. 2. Lobar and segmental pulmonary emboli incidentally noted in the upper lungs. 3. Minimal flow in the hypoplastic right vertebral artery. There is moderate to advanced atheromatous narrowing of the left V4 segment. 4. Carotid atherosclerosis without flow limiting stenosis or ulceration. 5. Suboptimal bolus.  11/30 CT head>>no acute findings 11/30 CTA head/neck>>no LVO 11/30 MRI brain>>Small acute infarcts involving the left frontal precentral gyrus and the juxtacortical left frontal white matter. Mild associated edema without mass effect 11/30 CT chest>>Bilateral multifocal pulmonary emboli the majority of which appear acute in nature. Some subacute to chronic thrombus is noted within the left lower lobe pulmonary artery. 12/1 echocardiogram with bubble study;Left Ventricle: Left ventricular ejection fraction, by estimation, is 25  to 30%. The left ventricle has severely decreased function.   Right Ventricle: The right ventricular size is mildly enlarged.   IAS/Shunts: Agitated saline contrast was given intravenously to evaluate  for intracardiac shunting. Agitated saline contrast bubble study was  negative, with no evidence of any interatrial shunt.    12/4 US abdomen RUQ; gallbladder sludge without evidence for acute cholecystitis   Cultures 11/21 SARS2/ flu >>neg 11/21 MRSA PCR >>neg 11/22 BCx  2 >> neg 11/23 sputum >> normal flora 12/3 acute hepatitis panel negative    Antimicrobials: Anti-infectives (From admission, onward)   Start     Ordered Stop   06/16/20 1030  gentamicin (GARAMYCIN) 80 mg in sodium chloride 0.9 % 500 mL irrigation        06/15/20 2108 06/17/20 1030   06/16/20 1030  ceFAZolin (ANCEF) IVPB 2g/100 mL premix        06/15/20 2108 06/17/20 1030   06/09/20 1045  ceFEPIme (MAXIPIME) 2 g in sodium chloride 0.9 % 100 mL IVPB        06/09/20 0948 06/13/20 0329   06/06/20 0800  vancomycin (VANCOREADY) IVPB 750 mg/150 mL  Status:  Discontinued        06/06/20 0713 06/07/20 1201   06/06/20 0800  ceFEPIme (MAXIPIME) 2 g in sodium chloride 0.9 % 100 mL IVPB  Status:  Discontinued        06/06/20 0713 06/09/20 0948      Continuous Infusions:    Objective: Vitals:   06/22/20 0425 06/22/20 0818 06/22/20 1150 06/22/20 1631  BP: (!) 134/94 120/70 (!) 121/97 115/75  Pulse: 63 70 64 60  Resp: 20 20 20 16   Temp: 97.6 F (36.4 C) 98.2 F (36.8 C) 97.8 F (36.6 C) 98.9 F (37.2 C)  TempSrc: Oral Oral Oral Oral  SpO2: 96% 97% 96% 97%  Weight: 84.6 kg     Height:        Intake/Output Summary (Last  24 hours) at 06/22/2020 1720 Last data filed at 06/22/2020 1633 Gross per 24 hour  Intake 2232.5 ml  Output 1550 ml  Net 682.5 ml   Filed Weights   06/20/20 0549 06/21/20 0420 06/22/20 0425  Weight: 84.5 kg 84.1 kg 84.6 kg   Physical Exam:  General: NAD   Cardiovascular: S1, S2 present  Respiratory: CTAB  Abdomen: Soft, nontender, nondistended, bowel sounds present  Musculoskeletal: No bilateral pedal edema noted  Skin: Normal  Psychiatry: Normal mood   CBC: Recent Labs  Lab 06/17/20 0058 06/18/20 0206 06/20/20 0056 06/21/20 0144 06/22/20 0125  WBC 16.5* 14.7* 15.2* 13.3* 14.2*  NEUTROABS 11.3* 9.8* 10.9* 8.6* 9.2*  HGB 14.5 14.1 14.0 14.2 14.3  HCT 43.9 42.9 41.0 43.0 43.9  MCV 90.7 92.3 91.7 93.7 93.2  PLT 475* 458* 499* 515* 542*    Basic Metabolic Panel: Recent Labs  Lab 06/18/20 0206 06/19/20 0202 06/20/20 0056 06/21/20 0144 06/22/20 0125  NA 140 137 137 136 135  K 4.3 4.2 4.1 4.1 5.0  CL 106 104 103 102 101  CO2 23 23 25 22 26   GLUCOSE 112* 116* 114* 138* 112*  BUN 20 20 20  25* 34*  CREATININE 1.06 1.07 1.12 1.05 1.17  CALCIUM 8.5* 8.5* 8.7* 8.7* 9.0  MG 1.9 2.2 2.1 2.1 2.1  PHOS 4.0 3.1 3.6 4.0 3.9   GFR: Estimated Creatinine Clearance: 64.6 mL/min (by C-G formula based on SCr of 1.17 mg/dL). Liver Function Tests: Recent Labs  Lab 06/18/20 0206 06/19/20 0202 06/20/20 0056 06/21/20 0144 06/22/20 0125  AST 53* 44* 41 38 55*  ALT 83* 74* 68* 63* 68*  ALKPHOS 57 58 64 60 63  BILITOT 1.0 0.9 1.0 0.9 1.0  PROT 7.1 6.6 6.8 7.0 7.0  ALBUMIN 2.9* 3.0* 3.0* 3.1* 3.1*   No results for input(s): LIPASE, AMYLASE in the last 168 hours. Recent Labs  Lab 06/16/20 0125 06/17/20 0058 06/18/20 0206 06/19/20 0202 06/20/20 0056  AMMONIA 57* 40* 31 38* 38*   Coagulation Profile: No results for input(s): INR, PROTIME in the last 168 hours. Cardiac Enzymes: No results for input(s): CKTOTAL, CKMB, CKMBINDEX, TROPONINI in the last 168 hours. BNP (last 3 results) No results for input(s): PROBNP in the last 8760 hours. HbA1C: No results for input(s): HGBA1C in the last 72 hours. CBG: Recent Labs  Lab 06/21/20 2330 06/22/20 0427 06/22/20 0814 06/22/20 1147 06/22/20 1628  GLUCAP 142* 125* 142* 126* 112*   Lipid Profile: No results for input(s): CHOL, HDL, LDLCALC, TRIG, CHOLHDL, LDLDIRECT in the last 72 hours. Thyroid Function Tests: No results for input(s): TSH, T4TOTAL, FREET4, T3FREE, THYROIDAB in the last 72 hours. Anemia Panel: No results for input(s): VITAMINB12, FOLATE, FERRITIN, TIBC, IRON, RETICCTPCT in the last 72 hours. Sepsis Labs: No results for input(s): PROCALCITON, LATICACIDVEN in the last 168 hours.  Recent Results (from the past 240 hour(s))  Surgical PCR screen     Status:  None   Collection Time: 06/15/20 10:50 PM   Specimen: Nasal Mucosa; Nasal Swab  Result Value Ref Range Status   MRSA, PCR NEGATIVE NEGATIVE Final   Staphylococcus aureus NEGATIVE NEGATIVE Final    Comment: (NOTE) The Xpert SA Assay (FDA approved for NASAL specimens in patients 58 years of age and older), is one component of a comprehensive surveillance program. It is not intended to diagnose infection nor to guide or monitor treatment. Performed at [facility], [place] [address] [place], [place] [address]  Radiology Studies: No results found.      Scheduled Meds: .  stroke: mapping our early stages of recovery book   Does not apply Once  . [START ON 06/23/2020] (feeding supplement) PROSource Plus  30 mL Oral BID BM  . amiodarone  200 mg Per Tube BID  . apixaban  5 mg Oral BID  . aspirin EC  81 mg Oral Daily  . atorvastatin  80 mg Per Tube q1800  . chlorhexidine  15 mL Mouth Rinse BID  . Chlorhexidine Gluconate Cloth  6 each Topical Daily  . clopidogrel  75 mg Oral Daily  . digoxin  0.125 mg Per Tube Daily  . escitalopram  10 mg Per Tube Daily  . ezetimibe  10 mg Per Tube Daily  . feeding supplement  237 mL Oral QID  . hydrALAZINE  10 mg Per Tube Q8H  . mouth rinse  15 mL Mouth Rinse q12n4p  . metoprolol succinate  25 mg Oral Daily  . sacubitril-valsartan  1 tablet Per Tube BID  . sodium chloride flush  3 mL Intravenous Q12H  . spironolactone  25 mg Per Tube Daily   Continuous Infusions:    LOS: 18 days     Alma Friendly, MD [facility]   If 7PM-7AM, please contact night-coverage 06/22/2020, 5:20 PM

## 2020-06-23 LAB — URINE CULTURE: Culture: NO GROWTH

## 2020-06-23 LAB — CBC WITH DIFFERENTIAL/PLATELET
Abs Immature Granulocytes: 0.09 10*3/uL — ABNORMAL HIGH (ref 0.00–0.07)
Basophils Absolute: 0.1 10*3/uL (ref 0.0–0.1)
Basophils Relative: 1 %
Eosinophils Absolute: 0.4 10*3/uL (ref 0.0–0.5)
Eosinophils Relative: 3 %
HCT: 45.4 % (ref 39.0–52.0)
Hemoglobin: 14.9 g/dL (ref 13.0–17.0)
Immature Granulocytes: 1 %
Lymphocytes Relative: 24 %
Lymphs Abs: 3 10*3/uL (ref 0.7–4.0)
MCH: 30.3 pg (ref 26.0–34.0)
MCHC: 32.8 g/dL (ref 30.0–36.0)
MCV: 92.5 fL (ref 80.0–100.0)
Monocytes Absolute: 1.3 10*3/uL — ABNORMAL HIGH (ref 0.1–1.0)
Monocytes Relative: 10 %
Neutro Abs: 7.9 10*3/uL — ABNORMAL HIGH (ref 1.7–7.7)
Neutrophils Relative %: 61 %
Platelets: 578 10*3/uL — ABNORMAL HIGH (ref 150–400)
RBC: 4.91 MIL/uL (ref 4.22–5.81)
RDW: 13.3 % (ref 11.5–15.5)
WBC: 12.8 10*3/uL — ABNORMAL HIGH (ref 4.0–10.5)
nRBC: 0 % (ref 0.0–0.2)

## 2020-06-23 LAB — COMPREHENSIVE METABOLIC PANEL
ALT: 69 U/L — ABNORMAL HIGH (ref 0–44)
AST: 40 U/L (ref 15–41)
Albumin: 3.3 g/dL — ABNORMAL LOW (ref 3.5–5.0)
Alkaline Phosphatase: 65 U/L (ref 38–126)
Anion gap: 12 (ref 5–15)
BUN: 34 mg/dL — ABNORMAL HIGH (ref 8–23)
CO2: 23 mmol/L (ref 22–32)
Calcium: 9.2 mg/dL (ref 8.9–10.3)
Chloride: 101 mmol/L (ref 98–111)
Creatinine, Ser: 1.16 mg/dL (ref 0.61–1.24)
GFR, Estimated: >60 mL/min (ref >60)
Glucose, Bld: 104 mg/dL — ABNORMAL HIGH (ref 70–99)
Potassium: 4.8 mmol/L (ref 3.5–5.1)
Sodium: 136 mmol/L (ref 135–145)
Total Bilirubin: 0.9 mg/dL (ref 0.3–1.2)
Total Protein: 7.4 g/dL (ref 6.5–8.1)

## 2020-06-23 LAB — GLUCOSE, CAPILLARY
Glucose-Capillary: 139 mg/dL — ABNORMAL HIGH (ref 70–99)
Glucose-Capillary: 114 mg/dL — ABNORMAL HIGH (ref 70–99)
Glucose-Capillary: 136 mg/dL — ABNORMAL HIGH (ref 70–99)
Glucose-Capillary: 89 mg/dL (ref 70–99)

## 2020-06-23 LAB — MAGNESIUM: Magnesium: 2.2 mg/dL (ref 1.7–2.4)

## 2020-06-23 LAB — PHOSPHORUS: Phosphorus: 3.9 mg/dL (ref 2.5–4.6)

## 2020-06-23 MED ORDER — DIGOXIN 125 MCG PO TABS
0.1250 mg | ORAL_TABLET | Freq: Every day | ORAL | Status: DC
  Administered 2020-06-23 – 2020-06-25 (×3): 0.125 mg via ORAL
  Filled 2020-06-23 (×2): qty 1

## 2020-06-23 MED ORDER — ATORVASTATIN CALCIUM 80 MG PO TABS
80.0000 mg | ORAL_TABLET | Freq: Every day | ORAL | Status: DC
  Administered 2020-06-23 – 2020-06-24 (×2): 80 mg via ORAL
  Filled 2020-06-23 (×2): qty 1

## 2020-06-23 MED ORDER — DIGOXIN 125 MCG PO TABS: 0.1250 mg | ORAL_TABLET | Freq: Every day | ORAL | Status: DC

## 2020-06-23 MED ORDER — ACETAMINOPHEN 325 MG PO TABS: 650.0000 mg | ORAL_TABLET | ORAL | Status: DC | PRN

## 2020-06-23 MED ORDER — AMIODARONE HCL 200 MG PO TABS
200.0000 mg | ORAL_TABLET | Freq: Two times a day (BID) | ORAL | Status: DC
  Administered 2020-06-23 – 2020-06-25 (×5): 200 mg via ORAL
  Filled 2020-06-23 (×4): qty 1

## 2020-06-23 MED ORDER — ESCITALOPRAM OXALATE 10 MG PO TABS
10.0000 mg | ORAL_TABLET | Freq: Every day | ORAL | Status: DC
  Administered 2020-06-23 – 2020-06-25 (×3): 10 mg via ORAL
  Filled 2020-06-23 (×2): qty 1

## 2020-06-23 MED ORDER — GUAIFENESIN 100 MG/5ML PO SOLN: 5.0000 mL | ORAL | Status: DC | PRN

## 2020-06-23 MED ORDER — EZETIMIBE 10 MG PO TABS
10.0000 mg | ORAL_TABLET | Freq: Every day | ORAL | Status: DC
  Administered 2020-06-23 – 2020-06-25 (×3): 10 mg via ORAL
  Filled 2020-06-23 (×2): qty 1

## 2020-06-23 MED ORDER — HYDRALAZINE HCL 10 MG PO TABS
10.0000 mg | ORAL_TABLET | Freq: Three times a day (TID) | ORAL | Status: DC
  Administered 2020-06-23 – 2020-06-25 (×6): 10 mg via ORAL
  Filled 2020-06-23 (×6): qty 1

## 2020-06-23 MED ORDER — SACUBITRIL-VALSARTAN 97-103 MG PO TABS
1.0000 | ORAL_TABLET | Freq: Two times a day (BID) | ORAL | Status: DC
  Administered 2020-06-23 – 2020-06-24 (×4): 1 via ORAL
  Filled 2020-06-23 (×5): qty 1

## 2020-06-23 MED ORDER — SPIRONOLACTONE 25 MG PO TABS
25.0000 mg | ORAL_TABLET | Freq: Every day | ORAL | Status: DC
  Administered 2020-06-23 – 2020-06-24 (×2): 25 mg via ORAL
  Filled 2020-06-23 (×2): qty 1

## 2020-06-23 NOTE — Progress Notes (Signed)
Calorie Count Note  48 hour calorie count ordered.  Diet: Regular  Supplements: Ensure Enlive po TID + Magic Cup TID  Day 2- 12/8 Breakfast: 149 kcal, 8 g protein Lunch: 160 kcal 6 g protein Dinner: 130 kcal 10 g protein Supplements: 1090 kcal 65 g protein  Total intake: 1529 kcal (76% of minimum estimated needs)  89 protein (81% of minimum estimated needs)  Estimated Nutritional Needs:  Kcal:  2000-2200 kcals Protein:  110-125 g Fluid:  >/= 2 L  Nutrition Dx: Inadequate oral intake related to inability to eat as evidenced by NPO status- Diet advanced   Goal: Patient will meet greater than or equal to 90% of their needs- progressing PO  Intervention:    Ensure Enlive po QID, each supplement provides 350 kcal and 20 grams of protein  30 ml ProSource Plus BID, each supplement provides 100 kcals and 15 grams protein.   Magic cup TID with meals, each supplement provides 290 kcal and 9 grams of protein  Mariana Single RD, LDN Clinical Nutrition Pager listed in [place]

## 2020-06-23 NOTE — Plan of Care (Signed)
  Problem: Education: Goal: Ability to manage disease process will improve Outcome: Progressing   Problem: Cardiac: Goal: Ability to achieve and maintain adequate cardiopulmonary perfusion will improve Outcome: Progressing   Problem: Education: Goal: Knowledge of General Education information will improve Description: Including pain rating scale, medication(s)/side effects and non-pharmacologic comfort measures Outcome: Progressing

## 2020-06-23 NOTE — Progress Notes (Signed)
Nutrition Follow-up  DOCUMENTATION CODES:   Obesity unspecified  INTERVENTION:   D/C calorie count D/C Cortrak tube   Ensure Enlive po QID, each supplement provides 350 kcal and 20 grams of protein  30 ml ProSource Plus BID, each supplement provides 100 kcals and 15 grams protein.   Magic cup TID with meals, each supplement provides 290 kcal and 9 grams of protein  MVI daily   NUTRITION DIAGNOSIS:   Inadequate oral intake related to inability to eat as evidenced by NPO status.  Diet advanced to regular   GOAL:   Patient will meet greater than or equal to 90% of their needs  Progressing  MONITOR:   Vent status,Labs,Weight trends,I & O's,Diet advancement  REASON FOR ASSESSMENT:   Ventilator    ASSESSMENT:   64 year old male admitted with recurrent VT. Past medical history of CAD s/p CABG (2014), chronic combined CHF, ICM, HTN, HLD, CVA, and recent admission for STEMI (10/21) secondary to thrombotic occlusion of SVG to RCA, discharged 10/23 on LifeVest  11/21 Intubated 11/23 Extubated, Re-intubated 11/24 TEE EF 30-35% 11/26 Extubated 11/29- Cortrak placed 12/9- TF held to assess PO intake  Meal intake continues to slowly progress. Patient taking two Ensures, two Prosource, and 1-2 Magic Cups daily. Meeting ~75% of needs. See calorie count note from today. RD encouraged meal and supplement intake. Plan Cortrak removal.   Plan d/c to CIR once bed become available.  Admission weight: 93.4 kg  Current weight: 84.4 kg   UOP: 2375 ml x 24 hrs   Medications: aldactone Labs: CBG 91-139  Diet Order:   Diet Order            Diet regular Room service appropriate? Yes with Assist; Fluid consistency: Thin  Diet effective now                 EDUCATION NEEDS:   No education needs have been identified at this time  Skin:  Skin Assessment: Skin Integrity Issues: Skin Integrity Issues:: Stage II,Incisions Stage II: buttocks Incisions: L chest  Last BM:   12/9  Height:   Ht Readings from Last 1 Encounters:  06/04/20 5\' 7"  (1.702 m)    Weight:   Wt Readings from Last 1 Encounters:  06/23/20 84.4 kg    BMI:  Body mass index is 29.14 kg/m.  Estimated Nutritional Needs:   Kcal:  2000-2200 kcals  Protein:  110-125 g  Fluid:  >/= 2 L   Mariana Single RD, LDN Clinical Nutrition Pager listed in [place]

## 2020-06-23 NOTE — Progress Notes (Signed)
Electrophysiology Rounding Note  Patient Name: [name] Date of Encounter: 06/23/2020  Primary Cardiologist: Sanda Klein, MD Electrophysiologist: Dr. Quentin Ore   Subjective   The patient is doing well today. He is hesitant about CIR after his long admission. He is anxious to get home.    At this time, the patient denies chest pain, shortness of breath, or any new concerns.  Inpatient Medications    Scheduled Meds: .  stroke: mapping our early stages of recovery book   Does not apply Once  . (feeding supplement) PROSource Plus  30 mL Oral BID BM  . amiodarone  200 mg Oral BID  . apixaban  5 mg Oral BID  . aspirin EC  81 mg Oral Daily  . atorvastatin  80 mg Oral q1800  . chlorhexidine  15 mL Mouth Rinse BID  . Chlorhexidine Gluconate Cloth  6 each Topical Daily  . clopidogrel  75 mg Oral Daily  . digoxin  0.125 mg Oral Daily  . escitalopram  10 mg Oral Daily  . ezetimibe  10 mg Oral Daily  . feeding supplement  237 mL Oral QID  . hydrALAZINE  10 mg Oral Q8H  . mouth rinse  15 mL Mouth Rinse q12n4p  . metoprolol succinate  25 mg Oral Daily  . sacubitril-valsartan  1 tablet Oral BID  . sodium chloride flush  3 mL Intravenous Q12H  . spironolactone  25 mg Oral Daily   Continuous Infusions:  PRN Meds: acetaminophen, albuterol, guaiFENesin, loperamide HCl, nitroGLYCERIN, ondansetron (ZOFRAN) IV   Vital Signs    Vitals:   06/22/20 2325 06/23/20 0221 06/23/20 0547 06/23/20 0808  BP: 112/70 120/84 110/74 (!) 128/99  Pulse: 72 66 68 76  Resp: 14 18 17  (!) 24  Temp: 98.7 F (37.1 C) 98.7 F (37.1 C)  98.4 F (36.9 C)  TempSrc: Oral Oral  Oral  SpO2: 98% 97%  95%  Weight:  84.4 kg    Height:        Intake/Output Summary (Last 24 hours) at 06/23/2020 1121 Last data filed at 06/23/2020 0814 Gross per 24 hour  Intake 1125 ml  Output 1825 ml  Net -700 ml   Filed Weights   06/21/20 0420 06/22/20 0425 06/23/20 0221  Weight: 84.1 kg 84.6 kg 84.4 kg     Physical Exam    GEN- The patient is well appearing, alert and oriented x 3 today.   Head- normocephalic, atraumatic Eyes-  Sclera clear, conjunctiva pink Ears- hearing intact Oropharynx- clear Neck- supple Lungs- Clear to ausculation bilaterally, normal work of breathing Heart- Regular rate and rhythm, no murmurs, rubs or gallops GI- soft, NT, ND, + BS Extremities- no clubbing or cyanosis. No edema Skin- no rash or lesion Psych- euthymic mood, full affect Neuro- strength and sensation are intact  Labs    CBC Recent Labs    06/22/20 0125 06/23/20 0114  WBC 14.2* 12.8*  NEUTROABS 9.2* 7.9*  HGB 14.3 14.9  HCT 43.9 45.4  MCV 93.2 92.5  PLT 542* 387*   Basic Metabolic Panel Recent Labs    06/22/20 0125 06/23/20 0114  NA 135 136  K 5.0 4.8  CL 101 101  CO2 26 23  GLUCOSE 112* 104*  BUN 34* 34*  CREATININE 1.17 1.16  CALCIUM 9.0 9.2  MG 2.1 2.2  PHOS 3.9 3.9   Liver Function Tests Recent Labs    06/22/20 0125 06/23/20 0114  AST 55* 40  ALT 68* 69*  ALKPHOS 63  65  BILITOT 1.0 0.9  PROT 7.0 7.4  ALBUMIN 3.1* 3.3*   No results for input(s): LIPASE, AMYLASE in the last 72 hours. Cardiac Enzymes No results for input(s): CKTOTAL, CKMB, CKMBINDEX, TROPONINI in the last 72 hours.   Telemetry    NSR 60-70s (personally reviewed)  Radiology    No results found.  Patient Profile     65 y.o.malewith aPMH of CAD (s/p CABG in 2014 with LIMA-LAD, SVG-OM2 and SVG-RCA, s/p angioplasty to proximal SVG-RCA and DES to SVG-RCA anastomosisin 03/2019), chronic combined systolic and diastolic CHF/ICM (EF 44-62% by echo in 06/2019), HTN, HLD,prior CVAand recent STEMIwith angioplasty of the distal bypass graft10/21/21  EMS [place] syncope at [place] found by EMS in [place]/VT treated with amio bolus' repeat bolus in ER >> asystole >> external pacing>> emergent cath/temp wire placement, intubated sedated.   Underwent ICD 06/19/2020  Assessment & Plan     1.VTarrest -> asystole S/p [employer] Dual chamber ICD 06/19/2020 Continue po amiodarone. ICD site stable. Pressure dressing removed. Large tegaderm can be removed at discharge or transfer to CIR.   2.Chronic systolic HF with biventricular failure ->cardiogenic shock  -Echo10/21/21 EF 25%. Moderate RV to severe RV dysfunction and possible LV thrombus. Previously d/c'd home on 05/06/20 -RHCthis admissionwith preserved output and well compensated filling pressures. On dig, entresto, aldactone. Appreciate HF team input. -ContinueToprol 25 mg daily  - Stable from EP perspective.   3. CAD s/p CABG  - Inferior STEMI 05/03/20 s/p POBA SVG->PDA (felt 2/2 missed brilinta doses) - Emergent cath 11/21/21with stable coronary anatomy - on DAPT/statin/zetia - No further s/s ischemia.   4. Pulmonary embolism Continue Eliquis 5 mg BID with recent ICD. Also on ASA/Plavix Follow ICD site for hematoma with DAPT and [place]  ICD site is table. Usual follow up is in chart. EP will follow at a distance. Please call us back for any questions or concerns.   For questions or updates, please contact [place] Please consult [email or web address] for contact info under Cardiology/STEMI.  Signed, Shirley Friar, PA-C  06/23/2020, 11:21 AM

## 2020-06-23 NOTE — Progress Notes (Signed)
IP rehab admissions - patient is doing well with therapies having walked 450' with minguard assist.  Currently no beds are available on CIR today or over the weekend for this patient.  If patient remains in hospital, will follow up on Monday for progress and bed availability.  Call for questions.  [phone]

## 2020-06-23 NOTE — Progress Notes (Signed)
Occupational Therapy Treatment Patient Details Name: [name] MRN: [id number] DOB: [birth year] Today's Date: 06/23/2020    History of present illness 64 yo with a hx as stated above who presented to [facility] 06/04/20 after having a pre-syncopal symptoms including dizzinezss while at church earlier today found to be in VT (not wearing Life Vest as prescribed on recent hospital discharge) on EMS arrival. Has not been compliant with LifeVest stating the battery pack too bulky. PMH of CAD (s/p CABG in 2014 with LIMA-LAD, SVG-OM2 and SVG-RCA, s/p angioplasty to proximal SVG-RCA and DES to SVG-RCA anastomosis in 03/2019), chronic combined systolic and diastolic CHF/ICM (EF 26-94% by echo in 06/2019), HTN, HLD, prior CVA and recent STEMI with angioplasty of the distal bypass graft 05/04/20. Admitted 06/04/20 for treatment of wide complex tachycardia. Intubated 11/21-Extubated 11/23 and reintubated extubated 11/26 R radial aline 11/23 TEE 11/24: EF 30-35% no evidence of LV clot.  11/30 pt noted to have increased R sided weakness by OT, code stroke called MRI revealed Small acute infarcts involving the left frontal precentral gyrus, ICD placed 06/19/20   OT comments  Pt. Seen for skilled OT treatment.  Able to complete LB dressing to adjust socks. Grooming set up seated.   Bed mobility heavier physical assistance to bring trunk upright while maintaining precautions.  Simulated toileting and grooming tasks with in room ambulation min guard a.  Remains motivated for participation in skilled therapies.    Follow Up Recommendations  CIR;Supervision/Assistance - 24 hour    Equipment Recommendations  3 in 1 bedside commode    Recommendations for Other Services      Precautions / Restrictions Precautions Precautions: Fall;ICD/Pacemaker Precaution Comments: Cortrak       Mobility Bed Mobility Overal bed mobility: Needs Assistance Bed Mobility: Sidelying to Sit;Rolling Rolling: Min guard Sidelying to  sit: HOB elevated;Min assist Supine to sit: HOB elevated;Min assist     General bed mobility comments: cues to maintain precautions, physical assistance to bring trunk upright  Transfers Overall transfer level: Needs assistance Equipment used: Rolling walker (2 wheeled) Transfers: Sit to/from [facility] Sit to Stand: Min guard Stand pivot transfers: Min guard       General transfer comment: Cues for hand placement and not to push with L UE into standing.    Balance                                           ADL either performed or assessed with clinical judgement   ADL Overall ADL's : Needs assistance/impaired     Grooming: Min guard;Standing Grooming Details (indicate cue type and reason): simulated with in room ambulation to/from b.room. declines need for acutal use but amb. in b.room and to recliner             Lower Body Dressing: Min guard;Sitting/lateral leans Lower Body Dressing Details (indicate cue type and reason): able to reach b les to adjust socks Toilet Transfer: Min guard;RW;Ambulation           Functional mobility during ADLs: Minimal assistance       Vision       Perception     Praxis      Cognition Arousal/Alertness: Awake/alert Behavior During Therapy: Flat affect Overall Cognitive Status: Impaired/Different from baseline  Exercises     Shoulder Instructions       General Comments      Pertinent Vitals/ Pain       Pain Assessment: No/denies pain  Home Living                                          Prior Functioning/Environment              Frequency  Min 3X/week        Progress Toward Goals  OT Goals(current goals can now be found in the care plan section)  Progress towards OT goals: Progressing toward goals     Plan Discharge plan remains appropriate;Frequency remains appropriate     Co-evaluation                 AM-PAC OT "6 Clicks" Daily Activity     Outcome Measure   Help from another person eating meals?: None Help from another person taking care of personal grooming?: A Little Help from another person toileting, which includes using toliet, bedpan, or urinal?: A Little Help from another person bathing (including washing, rinsing, drying)?: A Little Help from another person to put on and taking off regular upper body clothing?: A Little Help from another person to put on and taking off regular lower body clothing?: A Lot 6 Click Score: 18    End of Session Equipment Utilized During Treatment: Gait belt;Rolling walker  OT Visit Diagnosis: Unsteadiness on feet (R26.81);Other abnormalities of gait and mobility (R26.89);Muscle weakness (generalized) (M62.81)   Activity Tolerance Patient tolerated treatment well   Patient Left in chair;with call bell/phone within reach;with family/visitor present   Nurse Communication          Time: 0518-3358 OT Time Calculation (min): 12 min  Charges: OT General Charges $OT Visit: 1 Visit OT Treatments $Self Care/Home Management : 8-22 mins  Sonia Baller, COTA/L Acute Rehabilitation [phone]   [name] 06/23/2020, 12:22 PM

## 2020-06-23 NOTE — Progress Notes (Signed)
Cortrek removed per order. Pt tolerated procedure well.

## 2020-06-23 NOTE — Progress Notes (Signed)
PROGRESS NOTE    [name]  ZOX:[id number] DOB: 09/13/1955 DOA: 06/04/2020 PCP: [name], MD     Brief Narrative:  64 yo WM PMHx CAD w/ previous CABG in 2014, chronic combined systolic and diastolic HF/ ICM, HTN, HLD, CVA, and recent inferior STEMI 05/03/20 secondary to thrombotic occlusion of the SVG to the RCA, likely due to missed ticagrelor doses.  EF noted to be 25% with moderate RV to severe RV dysfunction and possible LV thrombus 05/04/2020.  He was discharged home on 10/23 on a LifeVest, but apparently not using. On 11/21, he developed dizziness at church. EMS found patient in wide complex tachycardia treated with amiodarone and lidocaine with successful conversion to NSR.  On arrival to ER, patient had recurrence of his VT but hemodynamically tolerating.  He was given additional amiodarone and lidocaine gtt started, however developed severe bradycardia requiring atropine and external pacing.  He was intubated and taken emergently for temporary transvenous pacemaker and coronary angiogram. Patient admitted for further management   Subjective: Patient still with poor appetite, reports minimal improvement.  Patient okay for core track to be taken out.  Patient hesitant about CIR, will reevaluate.   Assessment & Plan:   Active Problems:   Respiratory failure with hypoxia (HCC)   Ventricular tachycardia (HCC)   Cardiogenic shock (HCC)   Acute stroke due to ischemia [facility]   Acute pulmonary embolism without acute cor pulmonale (HCC)   Cardiac defibrillator in situ   Pressure injury of skin   Recurrent VT/bradycardia S/p temporary pacing wire (removed 11/27), now with [place] dual chamber ICD on 06/19/2020 Continue p.o. amiodarone, Toprol, digoxin EP team following  Cardiogenic shock/chronic systolic and diastolic CHF with biventricular failure Continue digoxin, Entresto, Aldactone, hydralazine, metoprolol, aspirin Cardiology/HF on board Continue strict I's and  O's, daily weights  CAD s/p CABG with recent inferior STEMI 04/2020 Management as above  Acute CVA MRI showed infarcts involving the left frontal precentral gyrus and the juxtacortical left frontal white matter, mild associated edema without mass-effect Continue Lipitor, aspirin, Plavix  Bilateral lobar and segmental PE/bilateral LE DVT Continue Eliquis  Dysphagia Improving Advance to regular diet, core track removed on 06/23/2020 SLP on board Aspiration precautions  HTN Continue Aldactone, hydralazine  HLD Continue Lipitor 80 mg daily   Leukocytosis ?Possible HCAP Chest x-ray negative for any pneumonia UA negative UC no growth Completed 7 days of Cefepime 11/29; cultures negative/ normal flora; continue to monitor clinically  Encephalopathy , Acute CVA-Embolic L infarcts Encephalopathy resolved Echocardiogram with bubble; negative for PFO see results below Neurology consulted  Deconditioning PT/OT  Mildly elevated liver enzymes Per patient and wife has significant history within his family of liver disease, some of it EtOH related.  Patient does not drink. Acute hepatitis panel negative, 12/3 US abdomen liver; negative Daily CMP   DVT prophylaxis: Eliquis Code Status: Full Family Communication: None at bedside  Status is: Inpatient    Dispo: The patient is from: Home              Anticipated d/c is to: CIR              Anticipated d/c date is: Once bed available in CIR              Patient currently unstable      Consultants:  HF tm PCCM EP Neurology   Procedures/Significant Events:  11/21 Admitted cardiology/ intubated/ L/RHC, TVP, PA catheter 11/22  Remains on NE, amio, heparin gtt, decreased  UOP.  CVP 8, CI 1.34, co-ox 60-66%, started on milrinone 11/23 Tmax 101.7, w/ increased respiratory secretions prompted the initiation of antibiotics. co-ox 72%, CVP 12, CI 3.2, SVR 1090. Able to titrate off sedation and decrease NE; extubated 14:22 then  re-intubated at 18:38.    11/21 R/LHC >   Severe 3 vessel occlusive CAD. 2. Patent LIMA to the LAD 3. Patent SVG to OM1 4. Patent SVG to RCA. Much improved flow from prior PCI. 5. Mildly elevated LVEDP 17 mm Hg 6. Successful placement of temporary transvenous pacemaker.  11/24 TEE >> neg for LV thrombus; EF 30-35%.Inferolateral HK  11/30 CT head>> 1. No acute abnormality. Generalized atrophy 2. Hypodensity right posterior cerebellum most consistent with chronic infarct, not present in 2020. 3. ASPECTS is 10  11/30 CTA head/ neck/ perfusion >> 1. No emergent large vessel occlusion or infarct/ischemia by perfusion. 2. Lobar and segmental pulmonary emboli incidentally noted in the upper lungs. 3. Minimal flow in the hypoplastic right vertebral artery. There is moderate to advanced atheromatous narrowing of the left V4 segment. 4. Carotid atherosclerosis without flow limiting stenosis or ulceration. 5. Suboptimal bolus.  11/30 CT head>>no acute findings 11/30 CTA head/neck>>no LVO 11/30 MRI brain>>Small acute infarcts involving the left frontal precentral gyrus and the juxtacortical left frontal white matter. Mild associated edema without mass effect 11/30 CT chest>>Bilateral multifocal pulmonary emboli the majority of which appear acute in nature. Some subacute to chronic thrombus is noted within the left lower lobe pulmonary artery. 12/1 echocardiogram with bubble study;Left Ventricle: Left ventricular ejection fraction, by estimation, is 25  to 30%. The left ventricle has severely decreased function.   Right Ventricle: The right ventricular size is mildly enlarged.   IAS/Shunts: Agitated saline contrast was given intravenously to evaluate  for intracardiac shunting. Agitated saline contrast bubble study was  negative, with no evidence of any interatrial shunt.    12/4 US abdomen RUQ; gallbladder sludge without evidence for acute cholecystitis   Cultures 11/21 SARS2/ flu  >>neg 11/21 MRSA PCR >>neg 11/22 BCx 2 >> neg 11/23 sputum >> normal flora 12/3 acute hepatitis panel negative    Antimicrobials: Anti-infectives (From admission, onward)   Start     Ordered Stop   06/16/20 1030  gentamicin (GARAMYCIN) 80 mg in sodium chloride 0.9 % 500 mL irrigation        06/15/20 2108 06/17/20 1030   06/16/20 1030  ceFAZolin (ANCEF) IVPB 2g/100 mL premix        06/15/20 2108 06/17/20 1030   06/09/20 1045  ceFEPIme (MAXIPIME) 2 g in sodium chloride 0.9 % 100 mL IVPB        06/09/20 0948 06/13/20 0329   06/06/20 0800  vancomycin (VANCOREADY) IVPB 750 mg/150 mL  Status:  Discontinued        06/06/20 0713 06/07/20 1201   06/06/20 0800  ceFEPIme (MAXIPIME) 2 g in sodium chloride 0.9 % 100 mL IVPB  Status:  Discontinued        06/06/20 0713 06/09/20 0948      Continuous Infusions:    Objective: Vitals:   06/23/20 0221 06/23/20 0547 06/23/20 0808 06/23/20 1241  BP: 120/84 110/74 (!) 128/99 104/67  Pulse: 66 68 76 74  Resp: 18 17 (!) 24 20  Temp: 98.7 F (37.1 C)  98.4 F (36.9 C) (!) 97.5 F (36.4 C)  TempSrc: Oral  Oral Oral  SpO2: 97%  95% 96%  Weight: 84.4 kg     Height:  Intake/Output Summary (Last 24 hours) at 06/23/2020 1543 Last data filed at 06/23/2020 0814 Gross per 24 hour  Intake 1125 ml  Output 1525 ml  Net -400 ml   Filed Weights   06/21/20 0420 06/22/20 0425 06/23/20 0221  Weight: 84.1 kg 84.6 kg 84.4 kg   Physical Exam:  General: NAD   Cardiovascular: S1, S2 present  Respiratory: CTAB  Abdomen: Soft, nontender, nondistended, bowel sounds present  Musculoskeletal: No bilateral pedal edema noted  Skin: Normal  Psychiatry: Normal mood   CBC: Recent Labs  Lab 06/18/20 0206 06/20/20 0056 06/21/20 0144 06/22/20 0125 06/23/20 0114  WBC 14.7* 15.2* 13.3* 14.2* 12.8*  NEUTROABS 9.8* 10.9* 8.6* 9.2* 7.9*  HGB 14.1 14.0 14.2 14.3 14.9  HCT 42.9 41.0 43.0 43.9 45.4  MCV 92.3 91.7 93.7 93.2 92.5  PLT 458* 499*  515* 542* 789*   Basic Metabolic Panel: Recent Labs  Lab 06/19/20 0202 06/20/20 0056 06/21/20 0144 06/22/20 0125 06/23/20 0114  NA 137 137 136 135 136  K 4.2 4.1 4.1 5.0 4.8  CL 104 103 102 101 101  CO2 23 25 22 26 23   GLUCOSE 116* 114* 138* 112* 104*  BUN 20 20 25* 34* 34*  CREATININE 1.07 1.12 1.05 1.17 1.16  CALCIUM 8.5* 8.7* 8.7* 9.0 9.2  MG 2.2 2.1 2.1 2.1 2.2  PHOS 3.1 3.6 4.0 3.9 3.9   GFR: Estimated Creatinine Clearance: 65 mL/min (by C-G formula based on SCr of 1.16 mg/dL). Liver Function Tests: Recent Labs  Lab 06/19/20 0202 06/20/20 0056 06/21/20 0144 06/22/20 0125 06/23/20 0114  AST 44* 41 38 55* 40  ALT 74* 68* 63* 68* 69*  ALKPHOS 58 64 60 63 65  BILITOT 0.9 1.0 0.9 1.0 0.9  PROT 6.6 6.8 7.0 7.0 7.4  ALBUMIN 3.0* 3.0* 3.1* 3.1* 3.3*   No results for input(s): LIPASE, AMYLASE in the last 168 hours. Recent Labs  Lab 06/17/20 0058 06/18/20 0206 06/19/20 0202 06/20/20 0056  AMMONIA 40* 31 38* 38*   Coagulation Profile: No results for input(s): INR, PROTIME in the last 168 hours. Cardiac Enzymes: No results for input(s): CKTOTAL, CKMB, CKMBINDEX, TROPONINI in the last 168 hours. BNP (last 3 results) No results for input(s): PROBNP in the last 8760 hours. HbA1C: No results for input(s): HGBA1C in the last 72 hours. CBG: Recent Labs  Lab 06/22/20 2009 06/22/20 2324 06/23/20 0349 06/23/20 0810 06/23/20 1245  GLUCAP 107* 91 139* 114* 136*   Lipid Profile: No results for input(s): CHOL, HDL, LDLCALC, TRIG, CHOLHDL, LDLDIRECT in the last 72 hours. Thyroid Function Tests: No results for input(s): TSH, T4TOTAL, FREET4, T3FREE, THYROIDAB in the last 72 hours. Anemia Panel: No results for input(s): VITAMINB12, FOLATE, FERRITIN, TIBC, IRON, RETICCTPCT in the last 72 hours. Sepsis Labs: No results for input(s): PROCALCITON, LATICACIDVEN in the last 168 hours.  Recent Results (from the past 240 hour(s))  Surgical PCR screen     Status: None    Collection Time: 06/15/20 10:50 PM   Specimen: Nasal Mucosa; Nasal Swab  Result Value Ref Range Status   MRSA, PCR NEGATIVE NEGATIVE Final   Staphylococcus aureus NEGATIVE NEGATIVE Final    Comment: (NOTE) The Xpert SA Assay (FDA approved for NASAL specimens in patients 28 years of age and older), is one component of a comprehensive surveillance program. It is not intended to diagnose infection nor to guide or monitor treatment. Performed at [facility], [place] [address] [place], [place] [address]   Culture, Urine  Status: None   Collection Time: 06/22/20  6:00 PM   Specimen: Urine, Random  Result Value Ref Range Status   Specimen Description URINE, RANDOM  Final   Special Requests NONE  Final   Culture   Final    NO GROWTH Performed at [facility], [address], [place], [place] [address]    Report Status 06/23/2020 FINAL  Final         Radiology Studies: No results found.      Scheduled Meds: .  stroke: mapping our early stages of recovery book   Does not apply Once  . (feeding supplement) PROSource Plus  30 mL Oral BID BM  . amiodarone  200 mg Oral BID  . apixaban  5 mg Oral BID  . aspirin EC  81 mg Oral Daily  . atorvastatin  80 mg Oral q1800  . chlorhexidine  15 mL Mouth Rinse BID  . Chlorhexidine Gluconate Cloth  6 each Topical Daily  . clopidogrel  75 mg Oral Daily  . digoxin  0.125 mg Oral Daily  . escitalopram  10 mg Oral Daily  . ezetimibe  10 mg Oral Daily  . feeding supplement  237 mL Oral QID  . hydrALAZINE  10 mg Oral Q8H  . mouth rinse  15 mL Mouth Rinse q12n4p  . metoprolol succinate  25 mg Oral Daily  . sacubitril-valsartan  1 tablet Oral BID  . sodium chloride flush  3 mL Intravenous Q12H  . spironolactone  25 mg Oral Daily   Continuous Infusions:    LOS: 19 days     Alma Friendly, MD [facility]   If 7PM-7AM, please contact night-coverage 06/23/2020, 3:43 PM

## 2020-06-24 ENCOUNTER — Inpatient Hospital Stay (HOSPITAL_COMMUNITY): Payer: Medicare HMO

## 2020-06-24 LAB — CBC WITH DIFFERENTIAL/PLATELET
Abs Immature Granulocytes: 0.08 10*3/uL — ABNORMAL HIGH (ref 0.00–0.07)
Basophils Absolute: 0.1 10*3/uL (ref 0.0–0.1)
Basophils Relative: 1 %
Eosinophils Absolute: 0.3 10*3/uL (ref 0.0–0.5)
Eosinophils Relative: 2 %
HCT: 44.1 % (ref 39.0–52.0)
Hemoglobin: 15.3 g/dL (ref 13.0–17.0)
Immature Granulocytes: 1 %
Lymphocytes Relative: 23 %
Lymphs Abs: 3.2 10*3/uL (ref 0.7–4.0)
MCH: 31.5 pg (ref 26.0–34.0)
MCHC: 34.7 g/dL (ref 30.0–36.0)
MCV: 90.7 fL (ref 80.0–100.0)
Monocytes Absolute: 1.4 10*3/uL — ABNORMAL HIGH (ref 0.1–1.0)
Monocytes Relative: 10 %
Neutro Abs: 8.7 10*3/uL — ABNORMAL HIGH (ref 1.7–7.7)
Neutrophils Relative %: 63 %
Platelets: 627 10*3/uL — ABNORMAL HIGH (ref 150–400)
RBC: 4.86 MIL/uL (ref 4.22–5.81)
RDW: 13.3 % (ref 11.5–15.5)
WBC: 13.6 10*3/uL — ABNORMAL HIGH (ref 4.0–10.5)
nRBC: 0 % (ref 0.0–0.2)

## 2020-06-24 LAB — COMPREHENSIVE METABOLIC PANEL
ALT: 60 U/L — ABNORMAL HIGH (ref 0–44)
AST: 32 U/L (ref 15–41)
Albumin: 3.3 g/dL — ABNORMAL LOW (ref 3.5–5.0)
Alkaline Phosphatase: 65 U/L (ref 38–126)
Anion gap: 10 (ref 5–15)
BUN: 40 mg/dL — ABNORMAL HIGH (ref 8–23)
CO2: 24 mmol/L (ref 22–32)
Calcium: 9.2 mg/dL (ref 8.9–10.3)
Chloride: 101 mmol/L (ref 98–111)
Creatinine, Ser: 1.34 mg/dL — ABNORMAL HIGH (ref 0.61–1.24)
GFR, Estimated: 58 mL/min — ABNORMAL LOW (ref >60)
Glucose, Bld: 110 mg/dL — ABNORMAL HIGH (ref 70–99)
Potassium: 4.9 mmol/L (ref 3.5–5.1)
Sodium: 135 mmol/L (ref 135–145)
Total Bilirubin: 1 mg/dL (ref 0.3–1.2)
Total Protein: 7.2 g/dL (ref 6.5–8.1)

## 2020-06-24 LAB — GLUCOSE, CAPILLARY
Glucose-Capillary: 101 mg/dL — ABNORMAL HIGH (ref 70–99)
Glucose-Capillary: 123 mg/dL — ABNORMAL HIGH (ref 70–99)
Glucose-Capillary: 158 mg/dL — ABNORMAL HIGH (ref 70–99)
Glucose-Capillary: 137 mg/dL — ABNORMAL HIGH (ref 70–99)
Glucose-Capillary: 108 mg/dL — ABNORMAL HIGH (ref 70–99)

## 2020-06-24 MED ORDER — LOPERAMIDE HCL 2 MG PO CAPS: 2.0000 mg | ORAL_CAPSULE | ORAL | Status: DC | PRN

## 2020-06-24 NOTE — Plan of Care (Signed)
  Problem: Education: Goal: Ability to manage disease process will improve Outcome: Progressing   Problem: Cardiac: Goal: Ability to achieve and maintain adequate cardiopulmonary perfusion will improve Outcome: Progressing   Problem: Education: Goal: Knowledge of General Education information will improve Description: Including pain rating scale, medication(s)/side effects and non-pharmacologic comfort measures Outcome: Progressing   Problem: Clinical Measurements: Goal: Ability to maintain clinical measurements within normal limits will improve Outcome: Progressing   Problem: Education: Goal: Knowledge of disease or condition will improve Outcome: Progressing Goal: Knowledge of secondary prevention will improve Outcome: Progressing

## 2020-06-24 NOTE — Progress Notes (Signed)
PROGRESS NOTE    [name]  FWY:[id number] DOB: Aug 20, 1955 DOA: 06/04/2020 PCP: [name], MD     Brief Narrative:  64 yo WM PMHx CAD w/ previous CABG in 2014, chronic combined systolic and diastolic HF/ ICM, HTN, HLD, CVA, and recent inferior STEMI 05/03/20 secondary to thrombotic occlusion of the SVG to the RCA, likely due to missed ticagrelor doses.  EF noted to be 25% with moderate RV to severe RV dysfunction and possible LV thrombus 05/04/2020.  He was discharged home on 10/23 on a LifeVest, but apparently not using. On 11/21, he developed dizziness at church. EMS found patient in wide complex tachycardia treated with amiodarone and lidocaine with successful conversion to NSR.  On arrival to ER, patient had recurrence of his VT but hemodynamically tolerating.  He was given additional amiodarone and lidocaine gtt started, however developed severe bradycardia requiring atropine and external pacing.  He was intubated and taken emergently for temporary transvenous pacemaker and coronary angiogram. Patient admitted for further management   Subjective: Denies any new complaints, still with poor appetite.  Awaiting CIR evaluation   Assessment & Plan:   Active Problems:   Respiratory failure with hypoxia (HCC)   Ventricular tachycardia (HCC)   Cardiogenic shock (HCC)   Acute stroke due to ischemia [facility])   Acute pulmonary embolism without acute cor pulmonale (HCC)   Cardiac defibrillator in situ   Pressure injury of skin   Recurrent VT/bradycardia S/p temporary pacing wire (removed 11/27), now with [place] dual chamber ICD on 06/19/2020 Continue p.o. amiodarone, Toprol, digoxin EP team following  Cardiogenic shock/chronic systolic and diastolic CHF with biventricular failure Continue digoxin, Entresto, Aldactone, hydralazine, metoprolol, aspirin Cardiology/HF on board Continue strict I's and O's, daily weights  CAD s/p CABG with recent inferior STEMI  04/2020 Management as above  Acute CVA MRI showed infarcts involving the left frontal precentral gyrus and the juxtacortical left frontal white matter, mild associated edema without mass-effect Continue Lipitor, aspirin, Plavix  Bilateral lobar and segmental PE/bilateral LE DVT Continue Eliquis  Dysphagia Improving Advance to regular diet, core track removed on 06/23/2020 SLP on board Aspiration precautions  HTN Continue Aldactone, hydralazine  HLD Continue Lipitor 80 mg daily   Leukocytosis ?Possible HCAP Chest x-ray negative for any pneumonia UA negative UC no growth Procalcitonin, repeat BC pending Completed 7 days of Cefepime 11/29; cultures negative/ normal flora; continue to monitor clinically  Encephalopathy , Acute CVA-Embolic L infarcts Encephalopathy resolved Echocardiogram with bubble; negative for PFO see results below Neurology consulted  Deconditioning PT/OT  Mildly elevated liver enzymes Per patient and wife has significant history within his family of liver disease, some of it EtOH related.  Patient does not drink. Acute hepatitis panel negative, 12/3 US abdomen liver; negative Daily CMP   DVT prophylaxis: Eliquis Code Status: Full Family Communication: None at bedside  Status is: Inpatient    Dispo: The patient is from: Home              Anticipated d/c is to: CIR              Anticipated d/c date is: Once bed available in CIR              Patient currently unstable      Consultants:  HF tm PCCM EP Neurology   Procedures/Significant Events:  11/21 Admitted cardiology/ intubated/ L/RHC, TVP, PA catheter 11/22  Remains on NE, amio, heparin gtt, decreased UOP.  CVP 8, CI 1.34, co-ox 60-66%, started  on milrinone 11/23 Tmax 101.7, w/ increased respiratory secretions prompted the initiation of antibiotics. co-ox 72%, CVP 12, CI 3.2, SVR 1090. Able to titrate off sedation and decrease NE; extubated 14:22 then re-intubated at 18:38.     11/21 R/LHC >   Severe 3 vessel occlusive CAD. 2. Patent LIMA to the LAD 3. Patent SVG to OM1 4. Patent SVG to RCA. Much improved flow from prior PCI. 5. Mildly elevated LVEDP 17 mm Hg 6. Successful placement of temporary transvenous pacemaker.  11/24 TEE >> neg for LV thrombus; EF 30-35%.Inferolateral HK  11/30 CT head>> 1. No acute abnormality. Generalized atrophy 2. Hypodensity right posterior cerebellum most consistent with chronic infarct, not present in 2020. 3. ASPECTS is 10  11/30 CTA head/ neck/ perfusion >> 1. No emergent large vessel occlusion or infarct/ischemia by perfusion. 2. Lobar and segmental pulmonary emboli incidentally noted in the upper lungs. 3. Minimal flow in the hypoplastic right vertebral artery. There is moderate to advanced atheromatous narrowing of the left V4 segment. 4. Carotid atherosclerosis without flow limiting stenosis or ulceration. 5. Suboptimal bolus.  11/30 CT head>>no acute findings 11/30 CTA head/neck>>no LVO 11/30 MRI brain>>Small acute infarcts involving the left frontal precentral gyrus and the juxtacortical left frontal white matter. Mild associated edema without mass effect 11/30 CT chest>>Bilateral multifocal pulmonary emboli the majority of which appear acute in nature. Some subacute to chronic thrombus is noted within the left lower lobe pulmonary artery. 12/1 echocardiogram with bubble study;Left Ventricle: Left ventricular ejection fraction, by estimation, is 25  to 30%. The left ventricle has severely decreased function.   Right Ventricle: The right ventricular size is mildly enlarged.   IAS/Shunts: Agitated saline contrast was given intravenously to evaluate  for intracardiac shunting. Agitated saline contrast bubble study was  negative, with no evidence of any interatrial shunt.    12/4 US abdomen RUQ; gallbladder sludge without evidence for acute cholecystitis   Cultures 11/21 SARS2/ flu >>neg 11/21 MRSA PCR  >>neg 11/22 BCx 2 >> neg 11/23 sputum >> normal flora 12/3 acute hepatitis panel negative    Antimicrobials: Anti-infectives (From admission, onward)   Start     Ordered Stop   06/16/20 1030  gentamicin (GARAMYCIN) 80 mg in sodium chloride 0.9 % 500 mL irrigation        06/15/20 2108 06/17/20 1030   06/16/20 1030  ceFAZolin (ANCEF) IVPB 2g/100 mL premix        06/15/20 2108 06/17/20 1030   06/09/20 1045  ceFEPIme (MAXIPIME) 2 g in sodium chloride 0.9 % 100 mL IVPB        06/09/20 0948 06/13/20 0329   06/06/20 0800  vancomycin (VANCOREADY) IVPB 750 mg/150 mL  Status:  Discontinued        06/06/20 0713 06/07/20 1201   06/06/20 0800  ceFEPIme (MAXIPIME) 2 g in sodium chloride 0.9 % 100 mL IVPB  Status:  Discontinued        06/06/20 0713 06/09/20 0948      Continuous Infusions:    Objective: Vitals:   06/24/20 0324 06/24/20 0745 06/24/20 1005 06/24/20 1215  BP: 111/86 109/86  102/83  Pulse: 63 (!) 53 79 66  Resp: 18 16  20   Temp: 97.6 F (36.4 C) 98.7 F (37.1 C)  97.6 F (36.4 C)  TempSrc: Oral Oral  Oral  SpO2: 95% 94%  96%  Weight: 82.5 kg     Height:        Intake/Output Summary (Last 24 hours) at 06/24/2020 1610  Last data filed at 06/24/2020 1514 Gross per 24 hour  Intake 290 ml  Output 850 ml  Net -560 ml   Filed Weights   06/22/20 0425 06/23/20 0221 06/24/20 0324  Weight: 84.6 kg 84.4 kg 82.5 kg   Physical Exam:  General: NAD   Cardiovascular: S1, S2 present  Respiratory: CTAB  Abdomen: Soft, nontender, nondistended, bowel sounds present  Musculoskeletal: No bilateral pedal edema noted  Skin: Normal  Psychiatry: Normal mood   CBC: Recent Labs  Lab 06/20/20 0056 06/21/20 0144 06/22/20 0125 06/23/20 0114 06/24/20 0133  WBC 15.2* 13.3* 14.2* 12.8* 13.6*  NEUTROABS 10.9* 8.6* 9.2* 7.9* 8.7*  HGB 14.0 14.2 14.3 14.9 15.3  HCT 41.0 43.0 43.9 45.4 44.1  MCV 91.7 93.7 93.2 92.5 90.7  PLT 499* 515* 542* 578* 622*   Basic Metabolic  Panel: Recent Labs  Lab 06/19/20 0202 06/20/20 0056 06/21/20 0144 06/22/20 0125 06/23/20 0114 06/24/20 0133  NA 137 137 136 135 136 135  K 4.2 4.1 4.1 5.0 4.8 4.9  CL 104 103 102 101 101 101  CO2 23 25 22 26 23 24   GLUCOSE 116* 114* 138* 112* 104* 110*  BUN 20 20 25* 34* 34* 40*  CREATININE 1.07 1.12 1.05 1.17 1.16 1.34*  CALCIUM 8.5* 8.7* 8.7* 9.0 9.2 9.2  MG 2.2 2.1 2.1 2.1 2.2  --   PHOS 3.1 3.6 4.0 3.9 3.9  --    GFR: Estimated Creatinine Clearance: 55.8 mL/min (A) (by C-G formula based on SCr of 1.34 mg/dL (H)). Liver Function Tests: Recent Labs  Lab 06/20/20 0056 06/21/20 0144 06/22/20 0125 06/23/20 0114 06/24/20 0133  AST 41 38 55* 40 32  ALT 68* 63* 68* 69* 60*  ALKPHOS 64 60 63 65 65  BILITOT 1.0 0.9 1.0 0.9 1.0  PROT 6.8 7.0 7.0 7.4 7.2  ALBUMIN 3.0* 3.1* 3.1* 3.3* 3.3*   No results for input(s): LIPASE, AMYLASE in the last 168 hours. Recent Labs  Lab 06/18/20 0206 06/19/20 0202 06/20/20 0056  AMMONIA 31 38* 38*   Coagulation Profile: No results for input(s): INR, PROTIME in the last 168 hours. Cardiac Enzymes: No results for input(s): CKTOTAL, CKMB, CKMBINDEX, TROPONINI in the last 168 hours. BNP (last 3 results) No results for input(s): PROBNP in the last 8760 hours. HbA1C: No results for input(s): HGBA1C in the last 72 hours. CBG: Recent Labs  Lab 06/23/20 1245 06/23/20 1709 06/23/20 2124 06/24/20 0610 06/24/20 1211  GLUCAP 136* 89 101* 123* 158*   Lipid Profile: No results for input(s): CHOL, HDL, LDLCALC, TRIG, CHOLHDL, LDLDIRECT in the last 72 hours. Thyroid Function Tests: No results for input(s): TSH, T4TOTAL, FREET4, T3FREE, THYROIDAB in the last 72 hours. Anemia Panel: No results for input(s): VITAMINB12, FOLATE, FERRITIN, TIBC, IRON, RETICCTPCT in the last 72 hours. Sepsis Labs: No results for input(s): PROCALCITON, LATICACIDVEN in the last 168 hours.  Recent Results (from the past 240 hour(s))  Surgical PCR screen      Status: None   Collection Time: 06/15/20 10:50 PM   Specimen: Nasal Mucosa; Nasal Swab  Result Value Ref Range Status   MRSA, PCR NEGATIVE NEGATIVE Final   Staphylococcus aureus NEGATIVE NEGATIVE Final    Comment: (NOTE) The Xpert SA Assay (FDA approved for NASAL specimens in patients 33 years of age and older), is one component of a comprehensive surveillance program. It is not intended to diagnose infection nor to guide or monitor treatment. Performed at [facility], [place] [address]  [place], [place] [address]   Culture, Urine     Status: None   Collection Time: 06/22/20  6:00 PM   Specimen: Urine, Random  Result Value Ref Range Status   Specimen Description URINE, RANDOM  Final   Special Requests NONE  Final   Culture   Final    NO GROWTH Performed at [facility], [place] [address] [place], [place] [address]    Report Status 06/23/2020 FINAL  Final         Radiology Studies: DG Chest Port 1 View  Result Date: 06/24/2020 CLINICAL DATA:  Leukocytosis. EXAM: PORTABLE CHEST 1 VIEW COMPARISON:  June 20, 2020. FINDINGS: The heart size and mediastinal contours are within normal limits. Both lungs are clear. Left-sided pacemaker is unchanged in position. Sternotomy wires are noted. No pneumothorax or pleural effusion is noted. The visualized skeletal structures are unremarkable. IMPRESSION: No active disease. Electronically Signed   By: Marijo Conception M.D.   On: 06/24/2020 08:36        Scheduled Meds: .  stroke: mapping our early stages of recovery book   Does not apply Once  . (feeding supplement) PROSource Plus  30 mL Oral BID BM  . amiodarone  200 mg Oral BID  . apixaban  5 mg Oral BID  . aspirin EC  81 mg Oral Daily  . atorvastatin  80 mg Oral q1800  . chlorhexidine  15 mL Mouth Rinse BID  . Chlorhexidine Gluconate Cloth  6 each Topical Daily  . clopidogrel  75 mg Oral Daily  . digoxin  0.125 mg Oral Daily  . escitalopram  10 mg Oral Daily  .  ezetimibe  10 mg Oral Daily  . feeding supplement  237 mL Oral QID  . hydrALAZINE  10 mg Oral Q8H  . mouth rinse  15 mL Mouth Rinse q12n4p  . metoprolol succinate  25 mg Oral Daily  . sacubitril-valsartan  1 tablet Oral BID  . sodium chloride flush  3 mL Intravenous Q12H  . spironolactone  25 mg Oral Daily   Continuous Infusions:    LOS: 20 days     Alma Friendly, MD [facility]   If 7PM-7AM, please contact night-coverage 06/24/2020, 4:10 PM

## 2020-06-24 NOTE — Progress Notes (Signed)
Mobility Specialist - Progress Note   06/24/20 1544  Mobility  Activity Ambulated in hall  Level of Assistance Minimal assist, patient does 75% or more  Assistive Device Front wheel walker  Distance Ambulated (ft) 730 ft  Mobility Response Tolerated well  Mobility performed by Mobility specialist  $Mobility charge 1 Mobility    Pre-mobility: 59 HR, BP During mobility: 73 HR Post-mobility: 58 HR  Asx throughout ambulation. Pt min assist to elevate trunk while in bed. Sitting up on edge of bed after walk.   Pricilla Handler Mobility Specialist Mobility Specialist Phone: [phone]

## 2020-06-25 ENCOUNTER — Other Ambulatory Visit: Payer: Self-pay

## 2020-06-25 ENCOUNTER — Inpatient Hospital Stay (HOSPITAL_COMMUNITY)
Admit: 2020-06-25 | Discharge: 2020-06-29 | DRG: 056 | Disposition: A | Discharge status: Home Health | Payer: Medicare HMO | Source: Intra-hospital | Attending: Physical Medicine & Rehabilitation | Admitting: Physical Medicine & Rehabilitation

## 2020-06-25 ENCOUNTER — Encounter (HOSPITAL_COMMUNITY): Payer: Self-pay | Admitting: Physical Medicine & Rehabilitation

## 2020-06-25 DIAGNOSIS — I5022 Chronic systolic (congestive) heart failure: Secondary | ICD-10-CM | POA: Diagnosis present

## 2020-06-25 DIAGNOSIS — R001 Bradycardia, unspecified: Secondary | ICD-10-CM | POA: Diagnosis present

## 2020-06-25 DIAGNOSIS — N179 Acute kidney failure, unspecified: Secondary | ICD-10-CM | POA: Diagnosis not present

## 2020-06-25 DIAGNOSIS — I472 Ventricular tachycardia: Secondary | ICD-10-CM | POA: Diagnosis not present

## 2020-06-25 DIAGNOSIS — Z8249 Family history of ischemic heart disease and other diseases of the circulatory system: Secondary | ICD-10-CM

## 2020-06-25 DIAGNOSIS — I69351 Hemiplegia and hemiparesis following cerebral infarction affecting right dominant side: Principal | ICD-10-CM

## 2020-06-25 DIAGNOSIS — Z95810 Presence of automatic (implantable) cardiac defibrillator: Secondary | ICD-10-CM

## 2020-06-25 DIAGNOSIS — Z7902 Long term (current) use of antithrombotics/antiplatelets: Secondary | ICD-10-CM

## 2020-06-25 DIAGNOSIS — I5082 Biventricular heart failure: Secondary | ICD-10-CM

## 2020-06-25 DIAGNOSIS — K5900 Constipation, unspecified: Secondary | ICD-10-CM | POA: Diagnosis present

## 2020-06-25 DIAGNOSIS — G8191 Hemiplegia, unspecified affecting right dominant side: Secondary | ICD-10-CM | POA: Diagnosis not present

## 2020-06-25 DIAGNOSIS — I712 Thoracic aortic aneurysm, without rupture, unspecified: Secondary | ICD-10-CM

## 2020-06-25 DIAGNOSIS — I69319 Unspecified symptoms and signs involving cognitive functions following cerebral infarction: Secondary | ICD-10-CM

## 2020-06-25 DIAGNOSIS — Z83438 Family history of other disorder of lipoprotein metabolism and other lipidemia: Secondary | ICD-10-CM | POA: Diagnosis not present

## 2020-06-25 DIAGNOSIS — K449 Diaphragmatic hernia without obstruction or gangrene: Secondary | ICD-10-CM | POA: Diagnosis present

## 2020-06-25 DIAGNOSIS — R7303 Prediabetes: Secondary | ICD-10-CM | POA: Diagnosis not present

## 2020-06-25 DIAGNOSIS — Z87891 Personal history of nicotine dependence: Secondary | ICD-10-CM | POA: Diagnosis not present

## 2020-06-25 DIAGNOSIS — D72829 Elevated white blood cell count, unspecified: Secondary | ICD-10-CM

## 2020-06-25 DIAGNOSIS — I110 Hypertensive heart disease with heart failure: Secondary | ICD-10-CM | POA: Diagnosis not present

## 2020-06-25 DIAGNOSIS — Z8673 Personal history of transient ischemic attack (TIA), and cerebral infarction without residual deficits: Secondary | ICD-10-CM

## 2020-06-25 DIAGNOSIS — G479 Sleep disorder, unspecified: Secondary | ICD-10-CM | POA: Diagnosis present

## 2020-06-25 DIAGNOSIS — I2699 Other pulmonary embolism without acute cor pulmonale: Secondary | ICD-10-CM | POA: Diagnosis not present

## 2020-06-25 DIAGNOSIS — I252 Old myocardial infarction: Secondary | ICD-10-CM | POA: Diagnosis not present

## 2020-06-25 DIAGNOSIS — Z8674 Personal history of sudden cardiac arrest: Secondary | ICD-10-CM

## 2020-06-25 DIAGNOSIS — Z951 Presence of aortocoronary bypass graft: Secondary | ICD-10-CM

## 2020-06-25 DIAGNOSIS — Z7901 Long term (current) use of anticoagulants: Secondary | ICD-10-CM

## 2020-06-25 DIAGNOSIS — I2510 Atherosclerotic heart disease of native coronary artery without angina pectoris: Secondary | ICD-10-CM | POA: Diagnosis present

## 2020-06-25 DIAGNOSIS — I5020 Unspecified systolic (congestive) heart failure: Secondary | ICD-10-CM

## 2020-06-25 DIAGNOSIS — Z955 Presence of coronary angioplasty implant and graft: Secondary | ICD-10-CM | POA: Diagnosis not present

## 2020-06-25 DIAGNOSIS — I6310 Cerebral infarction due to embolism of unspecified precerebral artery: Secondary | ICD-10-CM | POA: Diagnosis not present

## 2020-06-25 DIAGNOSIS — Z79899 Other long term (current) drug therapy: Secondary | ICD-10-CM

## 2020-06-25 DIAGNOSIS — I639 Cerebral infarction, unspecified: Secondary | ICD-10-CM

## 2020-06-25 DIAGNOSIS — I6300 Cerebral infarction due to thrombosis of unspecified precerebral artery: Secondary | ICD-10-CM

## 2020-06-25 DIAGNOSIS — E785 Hyperlipidemia, unspecified: Secondary | ICD-10-CM | POA: Diagnosis present

## 2020-06-25 DIAGNOSIS — R5381 Other malaise: Secondary | ICD-10-CM | POA: Diagnosis present

## 2020-06-25 DIAGNOSIS — R7401 Elevation of levels of liver transaminase levels: Secondary | ICD-10-CM | POA: Diagnosis present

## 2020-06-25 DIAGNOSIS — N1831 Chronic kidney disease, stage 3a: Secondary | ICD-10-CM

## 2020-06-25 DIAGNOSIS — Z7982 Long term (current) use of aspirin: Secondary | ICD-10-CM

## 2020-06-25 LAB — COMPREHENSIVE METABOLIC PANEL
ALT: 49 U/L — ABNORMAL HIGH (ref 0–44)
AST: 29 U/L (ref 15–41)
Albumin: 3.3 g/dL — ABNORMAL LOW (ref 3.5–5.0)
Alkaline Phosphatase: 68 U/L (ref 38–126)
Anion gap: 14 (ref 5–15)
BUN: 44 mg/dL — ABNORMAL HIGH (ref 8–23)
CO2: 20 mmol/L — ABNORMAL LOW (ref 22–32)
Calcium: 9.3 mg/dL (ref 8.9–10.3)
Chloride: 101 mmol/L (ref 98–111)
Creatinine, Ser: 1.56 mg/dL — ABNORMAL HIGH (ref 0.61–1.24)
GFR, Estimated: 49 mL/min — ABNORMAL LOW (ref >60)
Glucose, Bld: 98 mg/dL (ref 70–99)
Potassium: 5 mmol/L (ref 3.5–5.1)
Sodium: 135 mmol/L (ref 135–145)
Total Bilirubin: 1 mg/dL (ref 0.3–1.2)
Total Protein: 7.5 g/dL (ref 6.5–8.1)

## 2020-06-25 LAB — CBC WITH DIFFERENTIAL/PLATELET
Abs Immature Granulocytes: 0.08 10*3/uL — ABNORMAL HIGH (ref 0.00–0.07)
Basophils Absolute: 0.1 10*3/uL (ref 0.0–0.1)
Basophils Relative: 1 %
Eosinophils Absolute: 0.2 10*3/uL (ref 0.0–0.5)
Eosinophils Relative: 2 %
HCT: 44.7 % (ref 39.0–52.0)
Hemoglobin: 15.3 g/dL (ref 13.0–17.0)
Immature Granulocytes: 1 %
Lymphocytes Relative: 29 %
Lymphs Abs: 3.6 10*3/uL (ref 0.7–4.0)
MCH: 31.4 pg (ref 26.0–34.0)
MCHC: 34.2 g/dL (ref 30.0–36.0)
MCV: 91.8 fL (ref 80.0–100.0)
Monocytes Absolute: 1.2 10*3/uL — ABNORMAL HIGH (ref 0.1–1.0)
Monocytes Relative: 10 %
Neutro Abs: 7.3 10*3/uL (ref 1.7–7.7)
Neutrophils Relative %: 57 %
Platelets: 665 10*3/uL — ABNORMAL HIGH (ref 150–400)
RBC: 4.87 MIL/uL (ref 4.22–5.81)
RDW: 13.4 % (ref 11.5–15.5)
WBC: 12.5 10*3/uL — ABNORMAL HIGH (ref 4.0–10.5)
nRBC: 0 % (ref 0.0–0.2)

## 2020-06-25 LAB — GLUCOSE, CAPILLARY
Glucose-Capillary: 114 mg/dL — ABNORMAL HIGH (ref 70–99)
Glucose-Capillary: 142 mg/dL — ABNORMAL HIGH (ref 70–99)

## 2020-06-25 LAB — PROCALCITONIN: Procalcitonin: <0.1 ng/mL

## 2020-06-25 MED ORDER — CLOPIDOGREL BISULFATE 75 MG PO TABS
75.0000 mg | ORAL_TABLET | Freq: Every day | ORAL | Status: DC
  Administered 2020-06-26 – 2020-06-29 (×4): 75 mg via ORAL
  Filled 2020-06-25 (×4): qty 1

## 2020-06-25 MED ORDER — CHLORHEXIDINE GLUCONATE 0.12 % MT SOLN
15.0000 mL | Freq: Two times a day (BID) | OROMUCOSAL | Status: DC
  Administered 2020-06-25 – 2020-06-29 (×6): 15 mL via OROMUCOSAL
  Filled 2020-06-25 (×5): qty 15

## 2020-06-25 MED ORDER — ALBUTEROL SULFATE (2.5 MG/3ML) 0.083% IN NEBU: 2.5000 mg | INHALATION_SOLUTION | RESPIRATORY_TRACT | Status: DC | PRN

## 2020-06-25 MED ORDER — AMIODARONE HCL 200 MG PO TABS
200.0000 mg | ORAL_TABLET | Freq: Two times a day (BID) | ORAL | Status: DC
  Administered 2020-06-25 – 2020-06-29 (×8): 200 mg via ORAL
  Filled 2020-06-25 (×8): qty 1

## 2020-06-25 MED ORDER — GUAIFENESIN 100 MG/5ML PO SOLN: 5.0000 mL | ORAL | Status: DC | PRN

## 2020-06-25 MED ORDER — ENSURE ENLIVE PO LIQD: 237.0000 mL | Freq: Four times a day (QID) | ORAL | 12 refills | Status: DC

## 2020-06-25 MED ORDER — ONDANSETRON HCL 4 MG/2ML IJ SOLN: 4.0000 mg | Freq: Four times a day (QID) | INTRAMUSCULAR | Status: DC | PRN

## 2020-06-25 MED ORDER — ACETAMINOPHEN 325 MG PO TABS: 650.0000 mg | ORAL_TABLET | ORAL | Status: DC | PRN

## 2020-06-25 MED ORDER — PROSOURCE PLUS PO LIQD: 30.0000 mL | Freq: Two times a day (BID) | ORAL | Status: DC

## 2020-06-25 MED ORDER — DIGOXIN 125 MCG PO TABS: 0.1250 mg | ORAL_TABLET | Freq: Every day | ORAL | Status: DC

## 2020-06-25 MED ORDER — EZETIMIBE 10 MG PO TABS
10.0000 mg | ORAL_TABLET | Freq: Every day | ORAL | Status: DC
  Administered 2020-06-26 – 2020-06-29 (×4): 10 mg via ORAL
  Filled 2020-06-25 (×4): qty 1

## 2020-06-25 MED ORDER — ESCITALOPRAM OXALATE 10 MG PO TABS
10.0000 mg | ORAL_TABLET | Freq: Every day | ORAL | Status: DC
  Administered 2020-06-26 – 2020-06-29 (×4): 10 mg via ORAL
  Filled 2020-06-25 (×4): qty 1

## 2020-06-25 MED ORDER — METOPROLOL SUCCINATE ER 25 MG PO TB24
25.0000 mg | ORAL_TABLET | Freq: Every day | ORAL | Status: DC
  Administered 2020-06-26 – 2020-06-28 (×3): 25 mg via ORAL
  Filled 2020-06-25 (×3): qty 1

## 2020-06-25 MED ORDER — NITROGLYCERIN 0.4 MG SL SUBL: 0.4000 mg | SUBLINGUAL_TABLET | SUBLINGUAL | Status: DC | PRN

## 2020-06-25 MED ORDER — HYDRALAZINE HCL 10 MG PO TABS
10.0000 mg | ORAL_TABLET | Freq: Three times a day (TID) | ORAL | Status: DC
  Administered 2020-06-25 – 2020-06-29 (×11): 10 mg via ORAL
  Filled 2020-06-25 (×11): qty 1

## 2020-06-25 MED ORDER — DIGOXIN 125 MCG PO TABS
0.1250 mg | ORAL_TABLET | Freq: Every day | ORAL | Status: DC
  Administered 2020-06-26 – 2020-06-29 (×2): 0.125 mg via ORAL
  Filled 2020-06-25 (×3): qty 1

## 2020-06-25 MED ORDER — LOPERAMIDE HCL 2 MG PO CAPS: 2.0000 mg | ORAL_CAPSULE | ORAL | Status: DC | PRN

## 2020-06-25 MED ORDER — PROSOURCE PLUS PO LIQD
30.0000 mL | Freq: Two times a day (BID) | ORAL | Status: DC
  Administered 2020-06-26 – 2020-06-28 (×6): 30 mL via ORAL
  Filled 2020-06-25 (×8): qty 30

## 2020-06-25 MED ORDER — ORAL CARE MOUTH RINSE
15.0000 mL | Freq: Two times a day (BID) | OROMUCOSAL | Status: DC
  Administered 2020-06-26 – 2020-06-28 (×5): 15 mL via OROMUCOSAL

## 2020-06-25 MED ORDER — APIXABAN 5 MG PO TABS
5.0000 mg | ORAL_TABLET | Freq: Two times a day (BID) | ORAL | Status: DC
  Administered 2020-06-25 – 2020-06-29 (×8): 5 mg via ORAL
  Filled 2020-06-25 (×8): qty 1

## 2020-06-25 MED ORDER — APIXABAN 5 MG PO TABS: 5.0000 mg | ORAL_TABLET | Freq: Two times a day (BID) | ORAL | Status: DC

## 2020-06-25 MED ORDER — AMIODARONE HCL 200 MG PO TABS: 200.0000 mg | ORAL_TABLET | Freq: Two times a day (BID) | ORAL | Status: DC

## 2020-06-25 MED ORDER — ATORVASTATIN CALCIUM 80 MG PO TABS
80.0000 mg | ORAL_TABLET | Freq: Every day | ORAL | Status: DC
  Administered 2020-06-25 – 2020-06-28 (×4): 80 mg via ORAL
  Filled 2020-06-25 (×4): qty 1

## 2020-06-25 MED ORDER — SODIUM CHLORIDE 0.9% FLUSH
3.0000 mL | Freq: Two times a day (BID) | INTRAVENOUS | Status: DC
  Administered 2020-06-25 – 2020-06-27 (×4): 3 mL via INTRAVENOUS

## 2020-06-25 MED ORDER — ENSURE ENLIVE PO LIQD
237.0000 mL | Freq: Four times a day (QID) | ORAL | Status: DC
  Administered 2020-06-26 – 2020-06-29 (×8): 237 mL via ORAL

## 2020-06-25 MED ORDER — METOPROLOL SUCCINATE ER 25 MG PO TB24: 25.0000 mg | ORAL_TABLET | Freq: Every day | ORAL | Status: DC

## 2020-06-25 MED ORDER — CHLORHEXIDINE GLUCONATE CLOTH 2 % EX PADS: 6.0000 | MEDICATED_PAD | Freq: Every day | CUTANEOUS | Status: DC

## 2020-06-25 MED ORDER — ASPIRIN EC 81 MG PO TBEC
81.0000 mg | DELAYED_RELEASE_TABLET | Freq: Every day | ORAL | Status: DC
  Administered 2020-06-26 – 2020-06-29 (×4): 81 mg via ORAL
  Filled 2020-06-25 (×4): qty 1

## 2020-06-25 NOTE — Progress Notes (Signed)
Inpatient Rehab Admissions Coordinator:   I have a bed on CIR for this pt. Today. Will plan to move pt. Today. Pt. Was notified and message was left with his wife. RN may call report to [phone].  Clemens Catholic, [place], [place] Admissions Coordinator  [phone] ([place]) [phone] (office)

## 2020-06-25 NOTE — TOC Transition Note (Signed)
Transition of Care (TOC) - CM/SW Discharge Note Marvetta Gibbons RN, BSN Transitions of Care Unit 4E- RN Case Manager See Treatment Team for direct phone #    Patient Details  Name: [name] MRN: [id number] Date of Birth: [birth year]  Transition of Care [facility]) CM/SW Contact:  Dawayne Patricia, RN Phone Number: 06/25/2020, 3:18 PM   Clinical Narrative:    Notified by CIR AC- pt has a bed available for INPT rehab today, pt is medically stable to transition to rehab- MD has placed d/c order.  Plan to transition over to [place] rehab later today.    Final next level of care: IP Rehab Facility Barriers to Discharge: Barriers Resolved   Patient Goals and CMS Choice Patient states their goals for this hospitalization and ongoing recovery are:: rehab CMS Medicare.gov Compare Post Acute Care list provided to:: Patient Choice offered to / list presented to : Spouse,Patient  Discharge Placement                 INPT rehab      Discharge Plan and Services   Discharge Planning Services: CM Consult Post Acute Care Choice: IP Rehab                               Social Determinants of Health (SDOH) Interventions     Readmission Risk Interventions Readmission Risk Prevention Plan 06/25/2020  PCP or Specialist Appt within 3-5 Days (No Data)  [place] or [place] Complete  Social Work Consult for [place] Planning/Counseling Complete  Palliative Care Screening Not Applicable  Medication Review [employer] photographer) Complete  Some recent data might be hidden

## 2020-06-25 NOTE — Progress Notes (Signed)
Pt arrived to unit via w/c at approx 1628. Pt is alert and oriented x 4, able to make needs known. Oriented to rehab. Bed alarm on and functioning.

## 2020-06-25 NOTE — Discharge Summary (Signed)
Discharge Summary  [name] NLG:[id number] DOB: 04-Nov-1955  PCP: [name], MD  Admit date: 06/04/2020 Discharge date: 06/25/2020  Time spent: 40 mins  Recommendations for Outpatient Follow-up:  1. Follow-up with cardiology/EP 2. Follow-up with neurology 3. Follow-up with PCP  Discharge Diagnoses:  Active Hospital Problems   Diagnosis Date Noted  . Acute stroke due to ischemia ([place])   . Biventricular heart failure ([place])   . Thoracic aortic aneurysm without rupture ([place])   . Pressure injury of skin 06/21/2020  . Cardiac defibrillator in situ   . Acute pulmonary embolism without acute cor pulmonale (H[facility])   . Cardiogenic shock ([place])   . Ventricular tachycardia ([place]) 06/04/2020  . Respiratory failure with hypoxia [facility])     Resolved Hospital Problems  No resolved problems to display.    Discharge Condition: Stable  Diet recommendation: Regular diet due to poor appetite  Vitals:   06/25/20 0832 06/25/20 1000  BP: 99/84 98/79  Pulse: 61   Resp:  16  Temp: 97.7 F (36.5 C)   SpO2: 92%     History of present illness:  64 yo WM PMHx CAD w/ previous CABG in 2014, chronic combined systolic and diastolic HF/ ICM, HTN, HLD, CVA, and recent inferior STEMI 05/03/20 secondary to thrombotic occlusion of the SVG to the RCA, likely due to missed ticagrelor doses. EF noted to be 25% with moderate RV to severe RV dysfunction and possible LV thrombus 05/04/2020. He was discharged home on 10/23 on a LifeVest, but apparently not using. On 11/21, he developed dizziness at church. EMS found patient in wide complex tachycardia treated with amiodarone and lidocaine with successful conversion to NSR. On arrival to ER, patient had recurrence of his VT but hemodynamically tolerating. He was given additional amiodarone and lidocaine gtt started, however developed severe bradycardia requiring atropine and external pacing. He was intubated and taken emergently for temporary  transvenous pacemaker and coronary angiogram. Patient admitted for further management.   Today, patient denied any new complaints, reports appetite is somewhat improving. Stable to transfer to CIR for further rehab needs.    Hospital Course:  Principal Problem:   Acute stroke due to ischemia [name]) Active Problems:   Respiratory failure with hypoxia (HCC)   Ventricular tachycardia (HCC)   Cardiogenic shock (HCC)   Acute pulmonary embolism without acute cor pulmonale (HCC)   Cardiac defibrillator in situ   Pressure injury of skin   Biventricular heart failure (HCC)   Thoracic aortic aneurysm without rupture (HCC)   Recurrent VT/bradycardia S/p temporary pacing wire (removed 11/27), now with [facility] Jude dual chamber ICD on 06/19/2020 Continue p.o. amiodarone, Toprol, digoxin Cardiology/EP team following  Cardiogenic shock/chronic systolic and diastolic CHF with biventricular failure Held Entresto, Aldactone, hydralazine for now due to rising Cr and soft BP Continue metoprolol, aspirin Cardiology/HF on board- following, please look out for their recs for restarting Continue strict I's and O's, daily weights  CAD s/p CABG with recent inferior STEMI 04/2020 Management as above  Acute CVA MRI showed infarcts involving the left frontal precentral gyrus and the juxtacortical left frontal white matter, mild associated edema without mass-effect Continue Lipitor, aspirin, Plavix  Bilateral lobar and segmental PE/bilateral LE DVT Continue Eliquis  Dysphagia Improving Advanced to regular diet, core track removed on 06/23/2020 SLP on board Aspiration precautions  HTN BP soft Hold Aldactone, hydralazine  HLD Continue Lipitor 80 mg daily   Leukocytosis Afebrile, likely reactive Chest x-ray negative for any pneumonia UA negative UC no growth  Procalcitonin neg, repeat BC NGTD Completed 7 days of Cefepime 11/29; cultures negative/ normal flora; continue to monitor  clinically  Encephalopathy, Acute CVA-Embolic L infarcts Encephalopathy resolved Echocardiogram with bubble; negative for PFO see results below Neurology consulted, outpt follow up  Deconditioning PT/OT- CIR  Mildly elevated liver enzymes Trending down Per patient and wife has significant history within his family of liver disease, some of it EtOH related.  Patient does not drink. Acute hepatitis panel negative, 12/3 US abdomen liver; negative       Malnutrition Type:  Nutrition Problem: Inadequate oral intake Etiology: inability to eat   Malnutrition Characteristics:  Signs/Symptoms: NPO status   Nutrition Interventions:  Interventions: Refer to RD note for recommendations   Estimated body mass index is 28.47 kg/m as calculated from the following:   Height as of this encounter: 5\' 7"  (1.702 m).   Weight as of this encounter: 82.5 kg.    Procedures:  Mechanical ventilation  Right/left heart catheterization  TEE  Consultations:  Cardiology/EP  PCCM  Neurology    Discharge Exam: BP 98/79   Pulse 61   Temp 97.7 F (36.5 C) (Oral)   Resp 16   Ht 5\' 7"  (1.702 m)   Wt 82.5 kg Comment: scale B  SpO2 92%   BMI 28.47 kg/m   General: NAD Cardiovascular: S1, S2 present Respiratory: CTA B    Discharge Instructions You were cared for by a hospitalist during your hospital stay. If you have any questions about your discharge medications or the care you received while you were in the hospital after you are discharged, you can call the unit and asked to speak with the hospitalist on call if the hospitalist that took care of you is not available. Once you are discharged, your primary care physician will handle any further medical issues. Please note that NO REFILLS for any discharge medications will be authorized once you are discharged, as it is imperative that you return to your primary care physician (or establish a relationship with a primary care  physician if you do not have one) for your aftercare needs so that they can reassess your need for medications and monitor your lab values.  Discharge Instructions    Ambulatory referral to Neurology   Complete by: As directed    Follow up with stroke clinic NP (Jessica Vanschaick or Cecille Rubin, if both not available, consider Zachery Dauer, or A[place]) at [facility] in about 4 weeks. Thanks.   Diet - low sodium heart healthy   Complete by: As directed    Discharge wound care:   Complete by: As directed    Increase activity slowly   Complete by: As directed      Allergies as of 06/25/2020   No Known Allergies     Medication List    STOP taking these medications   ALPRAZolam 0.5 MG tablet Commonly known as: Xanax   Fish Oil 1000 MG Caps   sacubitril-valsartan 49-51 MG Commonly known as: ENTRESTO     TAKE these medications   (feeding supplement) PROSource Plus liquid Take 30 mLs by mouth 2 (two) times daily between meals.   feeding supplement Liqd Take 237 mLs by mouth 4 (four) times daily.   amiodarone 200 MG tablet Commonly known as: PACERONE Take 1 tablet (200 mg total) by mouth 2 (two) times daily.   apixaban 5 MG Tabs tablet Commonly known as: ELIQUIS Take 1 tablet (5 mg total) by mouth 2 (two) times daily.   aspirin  81 MG chewable tablet Chew 81 mg by mouth daily.   atorvastatin 80 MG tablet Commonly known as: LIPITOR TAKE 1 TABLET BY MOUTH DAILY AT 6 PM. What changed: See the new instructions.   clopidogrel 75 MG tablet Commonly known as: PLAVIX Take 1 tablet (75 mg total) by mouth daily.   digoxin 0.125 MG tablet Commonly known as: LANOXIN Take 1 tablet (0.125 mg total) by mouth daily. Start taking on: June 26, 2020   escitalopram 10 MG tablet Commonly known as: Lexapro Take 1 tablet (10 mg total) by mouth daily.   ezetimibe 10 MG tablet Commonly known as: ZETIA Take 1 tablet (10 mg total) by mouth daily.   metoprolol succinate 25 MG 24 hr  tablet Commonly known as: TOPROL-XL Take 1 tablet (25 mg total) by mouth daily. Take with or immediately following a meal. What changed:   medication strength  how much to take   nitroGLYCERIN 0.4 MG SL tablet Commonly known as: NITROSTAT Place 1 tablet (0.4 mg total) under the tongue every 5 (five) minutes x 3 doses as needed for chest pain.   One-A-Day Mens 50+ Advantage Tabs Take 1 tablet by mouth daily with breakfast.            Discharge Care Instructions  (From admission, onward)         Start     Ordered   06/25/20 0000  Discharge wound care:        06/25/20 1255         No Known Allergies  Follow-up Information    [facility]. Schedule an appointment as soon as possible for a visit in 4 week(s).   Specialty: Neurology Contact information: [address] [place] [phone]       Baldwin Jamaica, PA-C Follow up.   Specialty: Cardiology Why: 06/29/2020 @ 1:15PM, wound check (defibrillator) Contact information: [address] [place] [address] [phone]        Vickie Epley, MD Follow up.   Specialties: Cardiology, Radiology Why: 09/19/2020 @ 11:45AM Contact information: [address] [place] [address] [phone]        Erlene Quan, PA-C Follow up.   Specialties: Cardiology, Radiology Why: 07/17/2020 @ 9:45AM with Oswaldo Done, PA for Dr. Seth Bake information: [address] [place] [address] [phone]                The results of significant diagnostics from this hospitalization (including imaging, microbiology, ancillary and laboratory) are listed below for reference.    Significant Diagnostic Studies: CT Code Stroke CTA Head W/WO contrast  Result Date: 06/13/2020 CLINICAL DATA:  Right-sided weakness EXAM: CT ANGIOGRAPHY HEAD AND NECK CT PERFUSION BRAIN TECHNIQUE: Multidetector CT imaging of the head and  neck was performed using the standard protocol during bolus administration of intravenous contrast. Multiplanar CT image reconstructions and MIPs were obtained to evaluate the vascular anatomy. Carotid stenosis measurements (when applicable) are obtained utilizing NASCET criteria, using the distal internal carotid diameter as the denominator. Multiphase CT imaging of the brain was performed following IV bolus contrast injection. Subsequent parametric perfusion maps were calculated using RAPID software. CONTRAST:  184mL OMNIPAQUE IOHEXOL 350 MG/ML SOLN COMPARISON:  Noncontrast head CT from earlier today FINDINGS: CTA NECK FINDINGS Aortic arch: Delayed contrast bolus. No acute finding. There has been CABG. Atherosclerosis Right carotid system: Limited atheromatous changes without flow limiting stenosis, ulceration, or dissection. Left  carotid system: Mixed density plaque at the proximal left ICA without ulceration, flow limiting stenosis, or dissection. Vertebral arteries: Limited assessment due to the poor bolus quality. The left vertebral artery is strongly dominant diffusely patent to the dura. The hypoplastic right vertebral artery is only faintly enhancing. No proximal subclavian stenosis. Skeleton: Cervical spine degeneration. Dental caries with periapical erosions. Other neck: No visible inflammation or incidental mass. Feeding tube is in place. Upper chest: Pulmonary embolism seen at the left lower lobar artery and in segmental right upper lobe branches. Especially the right upper lobe emboli appear branching and recent. Review of the MIP images confirms the above findings CTA HEAD FINDINGS Anterior circulation: Suboptimal bolus density. Calcified plaque at both carotid siphons without flow limiting stenosis seen. Negative for aneurysm. Negative for branch occlusion or beading. Posterior circulation: Atheromatous plaque of the left vertebral and basilar arteries. The distal left V4 segment there is moderate or  advanced stenosis, measurement limited by poor bolus density. No branch occlusion, beading, or aneurysm. Venous sinuses: Not opacified. Anatomic variants: None significant Review of the MIP images confirms the above findings CT Brain Perfusion Findings: ASPECTS: 10 CBF (<30%) Volume: 70mL Perfusion (Tmax>6.0s) volume: 54mL when accounting for artifactual signal at the superior sagittal sinus Critical Value/emergent results were called by telephone at the time of interpretation on 06/13/2020 at 9:30 am to provider [facility] , who verbally acknowledged these results. IMPRESSION: 1. No emergent large vessel occlusion or infarct/ischemia by perfusion. 2. Lobar and segmental pulmonary emboli incidentally noted in the upper lungs. 3. Minimal flow in the hypoplastic right vertebral artery. There is moderate to advanced atheromatous narrowing of the left V4 segment. 4. Carotid atherosclerosis without flow limiting stenosis or ulceration. 5. Suboptimal bolus. Electronically Signed   By: Monte Fantasia M.D.   On: 06/13/2020 09:33   DG Chest 2 View  Result Date: 06/20/2020 CLINICAL DATA:  AICD. EXAM: CHEST - 2 VIEW COMPARISON:  Chest x-ray 06/18/2020. FINDINGS: Feeding tube noted with tip over the stomach. AICD noted with lead tips over the right atrium and right ventricle. Prior CABG. Heart size normal. Low lung volumes. No focal infiltrate. No pleural effusion or pneumothorax. IMPRESSION: 1. Feeding tube noted with tip over the stomach. 2. AICD noted with lead tips over the right atrium and right ventricle. Prior CABG. Heart size normal. 3. No acute pulmonary disease.  No pneumothorax. Electronically Signed   By: Marcello Moores  Register   On: 06/20/2020 06:45   DG Abd 1 View  Result Date: 06/11/2020 CLINICAL DATA:  Check gastric catheter placement EXAM: ABDOMEN - 1 VIEW COMPARISON:  Film from earlier in the same day. FINDINGS: Gastric catheter is again noted within the stomach although slightly withdrawn when compared  with the prior exam. Scattered large and small bowel gas is noted. No obstructive changes are seen. IMPRESSION: Gastric catheter within the stomach. Electronically Signed   By: Inez Catalina M.D.   On: 06/11/2020 21:14   DG Abd 1 View  Result Date: 06/11/2020 CLINICAL DATA:  Nasogastric tube placement. EXAM: ABDOMEN - 1 VIEW COMPARISON:  Abdominal radiograph performed the same day. FINDINGS: An enteric tube terminates in the stomach. Nonobstructive bowel gas pattern is noted. IMPRESSION: Enteric tube terminates in the stomach. Electronically Signed   By: Zerita Boers M.D.   On: 06/11/2020 14:17   DG Abd 1 View  Result Date: 06/11/2020 CLINICAL DATA:  Orogastric tube placement EXAM: ABDOMEN - 1 VIEW COMPARISON:  June 06, 2020 FINDINGS: Orogastric tube tip is  in the proximal stomach with the side port at the gastroesophageal junction. There is no bowel dilatation or air-fluid level to suggest bowel obstruction. No free air seen on supine examination. IMPRESSION: Orogastric tube tip in proximal stomach with side port at gastroesophageal junction. It may be prudent to consider advancing tube 5-6 cm. No bowel obstruction or free air evident on supine examination. Electronically Signed   By: Lowella Grip III M.D.   On: 06/11/2020 11:00   DG Abd 1 View  Result Date: 06/06/2020 CLINICAL DATA:  OG tube placement EXAM: ABDOMEN - 1 VIEW COMPARISON:  June 06, 2020 FINDINGS: The OG tube appears to project over the gastric antrum. The visualized bowel gas pattern is nonspecific. IMPRESSION: OG tube projects over the gastric antrum. Electronically Signed   By: Constance Holster M.D.   On: 06/06/2020 19:30   DG Abd 1 View  Result Date: 06/04/2020 CLINICAL DATA:  OG tube placement EXAM: ABDOMEN - 1 VIEW COMPARISON:  None. FINDINGS: The OG tube projects over the gastric body. A femoral approach Swan-Ganz catheter is noted with tip projecting over the right pulmonary artery. The visualized bowel gas  pattern is nonspecific and nonobstructive. IMPRESSION: OG tube projects over the gastric body. Electronically Signed   By: Constance Holster M.D.   On: 06/04/2020 19:17   CT Code Stroke CTA Neck W/WO contrast  Result Date: 06/13/2020 CLINICAL DATA:  Right-sided weakness EXAM: CT ANGIOGRAPHY HEAD AND NECK CT PERFUSION BRAIN TECHNIQUE: Multidetector CT imaging of the head and neck was performed using the standard protocol during bolus administration of intravenous contrast. Multiplanar CT image reconstructions and MIPs were obtained to evaluate the vascular anatomy. Carotid stenosis measurements (when applicable) are obtained utilizing NASCET criteria, using the distal internal carotid diameter as the denominator. Multiphase CT imaging of the brain was performed following IV bolus contrast injection. Subsequent parametric perfusion maps were calculated using RAPID software. CONTRAST:  174mL OMNIPAQUE IOHEXOL 350 MG/ML SOLN COMPARISON:  Noncontrast head CT from earlier today FINDINGS: CTA NECK FINDINGS Aortic arch: Delayed contrast bolus. No acute finding. There has been CABG. Atherosclerosis Right carotid system: Limited atheromatous changes without flow limiting stenosis, ulceration, or dissection. Left carotid system: Mixed density plaque at the proximal left ICA without ulceration, flow limiting stenosis, or dissection. Vertebral arteries: Limited assessment due to the poor bolus quality. The left vertebral artery is strongly dominant diffusely patent to the dura. The hypoplastic right vertebral artery is only faintly enhancing. No proximal subclavian stenosis. Skeleton: Cervical spine degeneration. Dental caries with periapical erosions. Other neck: No visible inflammation or incidental mass. Feeding tube is in place. Upper chest: Pulmonary embolism seen at the left lower lobar artery and in segmental right upper lobe branches. Especially the right upper lobe emboli appear branching and recent. Review of the  MIP images confirms the above findings CTA HEAD FINDINGS Anterior circulation: Suboptimal bolus density. Calcified plaque at both carotid siphons without flow limiting stenosis seen. Negative for aneurysm. Negative for branch occlusion or beading. Posterior circulation: Atheromatous plaque of the left vertebral and basilar arteries. The distal left V4 segment there is moderate or advanced stenosis, measurement limited by poor bolus density. No branch occlusion, beading, or aneurysm. Venous sinuses: Not opacified. Anatomic variants: None significant Review of the MIP images confirms the above findings CT Brain Perfusion Findings: ASPECTS: 10 CBF (<30%) Volume: 73mL Perfusion (Tmax>6.0s) volume: 34mL when accounting for artifactual signal at the superior sagittal sinus Critical Value/emergent results were called by telephone at the time of  interpretation on 06/13/2020 at 9:30 am to provider [facility] , who verbally acknowledged these results. IMPRESSION: 1. No emergent large vessel occlusion or infarct/ischemia by perfusion. 2. Lobar and segmental pulmonary emboli incidentally noted in the upper lungs. 3. Minimal flow in the hypoplastic right vertebral artery. There is moderate to advanced atheromatous narrowing of the left V4 segment. 4. Carotid atherosclerosis without flow limiting stenosis or ulceration. 5. Suboptimal bolus. Electronically Signed   By: Monte Fantasia M.D.   On: 06/13/2020 09:33   CT ANGIO CHEST PE W OR WO CONTRAST  Result Date: 06/13/2020 CLINICAL DATA:  Pulmonary emboli on recent CTA of neck EXAM: CT ANGIOGRAPHY CHEST WITH CONTRAST TECHNIQUE: Multidetector CT imaging of the chest was performed using the standard protocol during bolus administration of intravenous contrast. Multiplanar CT image reconstructions and MIPs were obtained to evaluate the vascular anatomy. CONTRAST:  53mL OMNIPAQUE IOHEXOL 350 MG/ML SOLN COMPARISON:  CT of the neck from earlier in the same day. FINDINGS:  Cardiovascular: Thoracic aorta demonstrates atherosclerotic calcifications and changes of prior coronary bypass grafting. Coronary calcifications are noted. No cardiac enlargement is noted. The pulmonary artery shows a normal branching pattern with significant bilateral pulmonary emboli the majority of which appears acute in nature. The changes in the lower lobe pulmonary artery on the left appear more subacute to chronic as there is apparent along the arterial wall. Mediastinum/Nodes: Thoracic inlet is within normal limits. No sizable hilar or mediastinal adenopathy is noted. Feeding catheter is noted extending into the distal stomach. The esophagus is within normal limits. Lungs/Pleura: Small pleural effusions are noted. The lungs are well aerated with mild lower lobe atelectatic changes right greater than left. No sizable parenchymal nodule is seen. Upper Abdomen: Vicarious excretion of contrast is noted within the gallbladder. The remainder of the upper abdomen is within limits. Musculoskeletal: Degenerative changes of the thoracic spine are seen. Review of the MIP images confirms the above findings. IMPRESSION: Bilateral multifocal pulmonary emboli the majority of which appear acute in nature. Some subacute to chronic thrombus is noted within the left lower lobe pulmonary artery. No right heart strain is noted. Bibasilar atelectatic changes with small effusions. Aortic Atherosclerosis (ICD10-I70.0). Electronically Signed   By: Inez Catalina M.D.   On: 06/13/2020 21:26   MR BRAIN WO CONTRAST  Result Date: 06/13/2020 CLINICAL DATA:  Stroke follow-up. EXAM: MRI HEAD WITHOUT CONTRAST TECHNIQUE: Multiplanar, multiecho pulse sequences of the brain and surrounding structures were obtained without intravenous contrast. COMPARISON:  Same day code stroke imaging. FINDINGS: Brain: Small acute infarcts involving the left frontal precentral gyrus (series 4, image 46) and the juxtacortical left frontal white matter  (series 4, images 39 through 41). Mild associated without mass effect. Additional scattered T2/FLAIR hyperintensities in the white matter, compatible with chronic microvascular ischemic disease. Generalized cerebral volume loss with ex vacuo ventricular dilation. Remote lacunar infarct in right cerebellar hemisphere. No acute hemorrhage. No hydrocephalus. No mass lesion or abnormal mass effect. No extra-axial fluid collection. Vascular: Major arterial flow voids are maintained at the skull base. See same day CTA for further evaluation. Skull and upper cervical spine: Normal marrow signal. Sinuses/Orbits: Mild scattered paranasal sinus mucosal thickening without air-fluid levels. Unremarkable orbits. Other: Bilateral moderate mastoid effusions. IMPRESSION: 1. Small acute infarcts involving the left frontal precentral gyrus and the juxtacortical left frontal white matter. Mild associated edema without mass effect. 2. Chronic microvascular ischemic disease and generalized cerebral volume loss. Electronically Signed   By: Margaretha Sheffield MD   On:  06/13/2020 16:21   CARDIAC CATHETERIZATION  Result Date: 06/04/2020  Mid LAD lesion is 100% stenosed.  Prox Cx lesion is 100% stenosed.  Prox RCA lesion is 100% stenosed.  Dist RCA lesion is 20% stenosed.  Non-stenotic Origin to Prox Graft lesion was previously treated.  Prox Graft lesion is 30% stenosed.  LV end diastolic pressure is mildly elevated.  1. Severe 3 vessel occlusive CAD. 2. Patent LIMA to the LAD 3. Patent SVG to OM1 4. Patent SVG to RCA. Much improved flow from prior PCI. 5. Mildly elevated LVEDP 17 mm Hg 6. Successful placement of temporary transvenous pacemaker. Plan: Dr Haroldine Laws placed a [name] catheter via the right internal jugular Vein. Will leave temporary transvenous pacemaker in place so that antiarrhythmic drugs can be used liberally.  Right heart cath demonstrates good cardiac output and right heart pressures so we deferred placing  an IABP. Will need right femoral arterial sheath removed manually. RHC Findings: Ao = 174/111 (138) LV = 169/16 RA =  7 RV = 41/6 PA = 37/20 (28) PCW = 14 Fick cardiac output/index = 11.6/5.7 PVR = 1.2 WU FA sat = 99% PA sat = 85%, 86% SVC sat = 89% High cardiac output with normal filling pressures. No evidence of intracardiac shunting.    CT Code Stroke Cerebral Perfusion with contrast  Result Date: 06/13/2020 CLINICAL DATA:  Right-sided weakness EXAM: CT ANGIOGRAPHY HEAD AND NECK CT PERFUSION BRAIN TECHNIQUE: Multidetector CT imaging of the head and neck was performed using the standard protocol during bolus administration of intravenous contrast. Multiplanar CT image reconstructions and MIPs were obtained to evaluate the vascular anatomy. Carotid stenosis measurements (when applicable) are obtained utilizing NASCET criteria, using the distal internal carotid diameter as the denominator. Multiphase CT imaging of the brain was performed following IV bolus contrast injection. Subsequent parametric perfusion maps were calculated using RAPID software. CONTRAST:  169mL OMNIPAQUE IOHEXOL 350 MG/ML SOLN COMPARISON:  Noncontrast head CT from earlier today FINDINGS: CTA NECK FINDINGS Aortic arch: Delayed contrast bolus. No acute finding. There has been CABG. Atherosclerosis Right carotid system: Limited atheromatous changes without flow limiting stenosis, ulceration, or dissection. Left carotid system: Mixed density plaque at the proximal left ICA without ulceration, flow limiting stenosis, or dissection. Vertebral arteries: Limited assessment due to the poor bolus quality. The left vertebral artery is strongly dominant diffusely patent to the dura. The hypoplastic right vertebral artery is only faintly enhancing. No proximal subclavian stenosis. Skeleton: Cervical spine degeneration. Dental caries with periapical erosions. Other neck: No visible inflammation or incidental mass. Feeding tube is in place. Upper chest:  Pulmonary embolism seen at the left lower lobar artery and in segmental right upper lobe branches. Especially the right upper lobe emboli appear branching and recent. Review of the MIP images confirms the above findings CTA HEAD FINDINGS Anterior circulation: Suboptimal bolus density. Calcified plaque at both carotid siphons without flow limiting stenosis seen. Negative for aneurysm. Negative for branch occlusion or beading. Posterior circulation: Atheromatous plaque of the left vertebral and basilar arteries. The distal left V4 segment there is moderate or advanced stenosis, measurement limited by poor bolus density. No branch occlusion, beading, or aneurysm. Venous sinuses: Not opacified. Anatomic variants: None significant Review of the MIP images confirms the above findings CT Brain Perfusion Findings: ASPECTS: 10 CBF (<30%) Volume: 70mL Perfusion (Tmax>6.0s) volume: 109mL when accounting for artifactual signal at the superior sagittal sinus Critical Value/emergent results were called by telephone at the time of interpretation on 06/13/2020 at 9:30  am to provider [facility] , who verbally acknowledged these results. IMPRESSION: 1. No emergent large vessel occlusion or infarct/ischemia by perfusion. 2. Lobar and segmental pulmonary emboli incidentally noted in the upper lungs. 3. Minimal flow in the hypoplastic right vertebral artery. There is moderate to advanced atheromatous narrowing of the left V4 segment. 4. Carotid atherosclerosis without flow limiting stenosis or ulceration. 5. Suboptimal bolus. Electronically Signed   By: Monte Fantasia M.D.   On: 06/13/2020 09:33   DG Chest Port 1 View  Result Date: 06/24/2020 CLINICAL DATA:  Leukocytosis. EXAM: PORTABLE CHEST 1 VIEW COMPARISON:  June 20, 2020. FINDINGS: The heart size and mediastinal contours are within normal limits. Both lungs are clear. Left-sided pacemaker is unchanged in position. Sternotomy wires are noted. No pneumothorax or pleural  effusion is noted. The visualized skeletal structures are unremarkable. IMPRESSION: No active disease. Electronically Signed   By: Marijo Conception M.D.   On: 06/24/2020 08:36   DG CHEST PORT 1 VIEW  Result Date: 06/18/2020 CLINICAL DATA:  Follow-up pleural effusion EXAM: PORTABLE CHEST 1 VIEW COMPARISON:  06/13/2020 FINDINGS: Cardiac shadow is within normal limits. Feeding catheter extends into the stomach. Postsurgical changes are noted. The lungs are well aerated without focal infiltrate or effusion. No bony abnormality is seen. IMPRESSION: No acute abnormality noted. Electronically Signed   By: Inez Catalina M.D.   On: 06/18/2020 08:13   DG CHEST PORT 1 VIEW  Result Date: 06/07/2020 CLINICAL DATA:  Heart rate problem. EXAM: PORTABLE CHEST 1 VIEW COMPARISON:  June 07, 2020 (7:45 a.m.) FINDINGS: There is stable endotracheal tube, nasogastric tube and right internal jugular Swan-Ganz catheter positioning. A pacing lead is again seen likely entering via a femoral approach. Its distal tip projects just below the expected region of the diaphragm, along the midline. Mild, stable atelectasis and/or infiltrate is seen within the retrocardiac region of the left lung base. Very mild stable linear atelectasis is also seen within the right lung base. A very small, stable left pleural effusion is seen. No pneumothorax is identified. The cardiac silhouette is mildly enlarged and unchanged in size. The visualized skeletal structures are unremarkable. IMPRESSION: 1. Stable line positioning with stable left basilar atelectasis and/or infiltrate. 2. Very small, stable left pleural effusion. Electronically Signed   By: Virgina Norfolk M.D.   On: 06/07/2020 17:00   DG CHEST PORT 1 VIEW  Result Date: 06/07/2020 CLINICAL DATA:  Respiratory failure EXAM: PORTABLE CHEST 1 VIEW COMPARISON:  06/06/2020 FINDINGS: Cardiac shadow is enlarged but stable. Swan-Ganz catheter, endotracheal tube and gastric catheter are noted in  satisfactory position. The overall inspiratory effort is poor with slight increase in left-sided pleural effusion. Mild bibasilar atelectasis is noted. IMPRESSION: Slight increase in left-sided pleural effusion. Mild bibasilar atelectasis. Tubes and lines as described above. Electronically Signed   By: Inez Catalina M.D.   On: 06/07/2020 08:17   DG CHEST PORT 1 VIEW  Result Date: 06/06/2020 CLINICAL DATA:  Intubation. EXAM: PORTABLE CHEST 1 VIEW COMPARISON:  June 06, 2020 FINDINGS: The endotracheal tube terminates above the carina by approximately 3 cm. The Swan-Ganz catheter tip projects over the main right pulmonary artery. The enteric tube appears to extend below the left hemidiaphragm. There is a femoral approach line projecting over the right ventricle which may represent a pacing lead. There are bilateral pleural effusions which have increased from prior study. The heart size is stable. Aortic calcifications are noted. There is no pneumothorax. IMPRESSION: 1. Lines and tubes as above.  2. Worsening bilateral pleural effusions. 3. No pneumothorax. The cardiac silhouette is stable in size from prior study. Electronically Signed   By: Constance Holster M.D.   On: 06/06/2020 19:28   DG CHEST PORT 1 VIEW  Result Date: 06/06/2020 CLINICAL DATA:  Intubation.  Respiratory failure. EXAM: PORTABLE CHEST 1 VIEW COMPARISON:  06/04/2020. FINDINGS: Interim placement of Swan-Ganz catheter, its tip is over the right main pulmonary artery. Endotracheal tube and NG tube in stable position. Prior median sternotomy. Stable cardiomegaly. No pulmonary venous congestion. Mild bibasilar atelectasis/infiltrates. No pleural effusion or pneumothorax. IMPRESSION: 1. Interim placement of Swan-Ganz catheter, its tip is over the right main pulmonary artery. Endotracheal tube and NG tube in stable position. 2. Prior median sternotomy. Stable cardiomegaly. Electronically Signed   By: Marcello Moores  Register   On: 06/06/2020 07:05   DG  Chest Port 1 View  Result Date: 06/04/2020 CLINICAL DATA:  Intubation EXAM: PORTABLE CHEST 1 VIEW COMPARISON:  Same day chest radiograph FINDINGS: Interval placement of endotracheal tube with distal tip terminating approximately 3.1 cm above the carina. Interval placement of enteric tube which courses below the diaphragm with distal tip beyond the inferior margin of the film. Prior median sternotomy. Multiple overlying cardiac leads and pacer pad. Stable cardiomegaly. Left basilar atelectasis. No evidence of pneumothorax. IMPRESSION: 1. Interval placement of endotracheal and enteric tubes, as described. 2. Left basilar atelectasis. Electronically Signed   By: Davina Poke D.O.   On: 06/04/2020 14:44   DG Chest Port 1 View  Result Date: 06/04/2020 CLINICAL DATA:  64 year old male with chest pain, ventricular tachycardia. EXAM: PORTABLE CHEST 1 VIEW COMPARISON:  Portable chest 03/23/2019 and earlier. FINDINGS: Portable AP semi upright view at 1140 hours. Pacer or resuscitation pads project over the chest. Stable mild cardiomegaly. Prior sternotomy. Other mediastinal contours are within normal limits. Visualized tracheal air column is within normal limits. Allowing for portable technique the lungs are clear. No pneumothorax or pleural effusion. No acute osseous abnormality identified. IMPRESSION: Stable mild cardiomegaly.  No acute cardiopulmonary abnormality. Electronically Signed   By: Genevie Ann M.D.   On: 06/04/2020 12:26   DG Abd Portable 1V  Result Date: 06/06/2020 CLINICAL DATA:  Abdominal distension EXAM: PORTABLE ABDOMEN - 1 VIEW COMPARISON:  06/04/2020 FINDINGS: Partially visualized enteric tube is coiled within the stomach. Right femoral central venous catheter extends beyond the superior margin of the film. Nonobstructive bowel gas pattern. No gross free intraperitoneal air. IMPRESSION: Nonobstructive bowel gas pattern. Electronically Signed   By: Davina Poke D.O.   On: 06/06/2020 08:34    DG Swallowing Func-Speech Pathology  Result Date: 06/14/2020 Objective Swallowing Evaluation: Type of Study: MBS-Modified Barium Swallow Study  Patient Details Name: Delmar Arriaga MRN: [id number] Date of Birth: 04-29-1956 Today's Date: 06/14/2020 Time: SLP Start Time (ACUTE ONLY): 0900 -SLP Stop Time (ACUTE ONLY): 0918 SLP Time Calculation (min) (ACUTE ONLY): 18 min Past Medical History: Past Medical History: Diagnosis Date . Borderline diabetic  . Coronary artery disease   a. s/p CABG in 2014 with LIMA-LAD, SVG-OM2 and SVG-RC b. 03/2019: STEMI and s/p angioplasty to proximal SVG-RCA and DES to SVG-RCA anastomosis c. 04/2020: recurrent STEMI with angioplasty of SVG-RCA . Dyslipidemia 01/11/2013 . Hiatal hernia  . Hypertension  . Mini stroke ([place])   2 . S/P CABG x 3  . STEMI (ST elevation myocardial infarction) ([address])  . Stroke [facility])  Past Surgical History: Past Surgical History: Procedure Laterality Date . CARDIAC CATHETERIZATION   . CORONARY ARTERY BYPASS GRAFT N/A  09/01/2012  Procedure: CORONARY ARTERY BYPASS GRAFTING (CABG);  Surgeon: Gaye Pollack, MD;  Location: [place];  Service: Open Heart Surgery;  Laterality: N/A;  Coronary Artery Bypass Grafting Times Three Using Left Internal Mammary Artery and Left Saphenous leg Vein Harvested Endoscopically . CORONARY STENT INTERVENTION N/A 03/23/2019  Procedure: CORONARY STENT INTERVENTION;  Surgeon: Lorretta Harp, MD;  Location: [place] CV LAB;  Service: Cardiovascular;  Laterality: N/A; . CORONARY/GRAFT ACUTE MI REVASCULARIZATION N/A 03/23/2019  Procedure: Coronary/Graft Acute MI Revascularization;  Surgeon: Lorretta Harp, MD;  Location: [place] CV LAB;  Service: Cardiovascular;  Laterality: N/A; . CORONARY/GRAFT ACUTE MI REVASCULARIZATION N/A 05/03/2020  Procedure: Coronary/Graft Acute MI Revascularization;  Surgeon: Belva Crome, MD;  Location: [place] CV LAB;  Service: Cardiovascular;  Laterality: N/A; . IABP INSERTION N/A 06/04/2020  Procedure:  IABP Insertion;  Surgeon: Martinique, Peter M, MD;  Location: [place] CV LAB;  Service: Cardiovascular;  Laterality: N/A; . INTRAOPERATIVE TRANSESOPHAGEAL ECHOCARDIOGRAM N/A 09/01/2012  Procedure: INTRAOPERATIVE TRANSESOPHAGEAL ECHOCARDIOGRAM;  Surgeon: Gaye Pollack, MD;  Location: [place] OR;  Service: Open Heart Surgery;  Laterality: N/A; . LEFT HEART CATH AND CORONARY ANGIOGRAPHY N/A 03/23/2019  Procedure: LEFT HEART CATH AND CORONARY ANGIOGRAPHY;  Surgeon: Lorretta Harp, MD;  Location: [place] CV LAB;  Service: Cardiovascular;  Laterality: N/A; . LEFT HEART CATH AND CORONARY ANGIOGRAPHY N/A 05/03/2020  Procedure: LEFT HEART CATH AND CORONARY ANGIOGRAPHY;  Surgeon: Belva Crome, MD;  Location: [place] CV LAB;  Service: Cardiovascular;  Laterality: N/A; . LEFT HEART CATHETERIZATION WITH CORONARY ANGIOGRAM N/A 08/31/2012  Procedure: LEFT HEART CATHETERIZATION WITH CORONARY ANGIOGRAM;  Surgeon: Peter M Martinique, MD;  Location: [facility] CATH LAB;  Service: Cardiovascular;  Laterality: N/A; . PERCUTANEOUS CORONARY STENT INTERVENTION (PCI-S) N/A 08/31/2012  Procedure: PERCUTANEOUS CORONARY STENT INTERVENTION (PCI-S);  Surgeon: Peter M Martinique, MD;  Location: [facility] CATH LAB;  Service: Cardiovascular;  Laterality: N/A; . RIGHT/LEFT HEART CATH AND CORONARY/GRAFT ANGIOGRAPHY N/A 06/04/2020  Procedure: RIGHT/LEFT HEART CATH AND CORONARY/GRAFT ANGIOGRAPHY;  Surgeon: Martinique, Peter M, MD;  Location: [place] CV LAB;  Service: Cardiovascular;  Laterality: N/A; . TEMPORARY PACEMAKER N/A 06/04/2020  Procedure: TEMPORARY PACEMAKER;  Surgeon: Martinique, Peter M, MD;  Location: [place] CV LAB;  Service: Cardiovascular;  Laterality: N/A; HPI: 41 yoM with extensive cardiac history with recent STEMI 04/2020, D/Cd home on 10/23 with LIfevest, per notes was not using. Admitted 11/21 with Admitted 11/21 VT arrest, Acute on Chronic systolic HF with biventricular failure ->cardiogenic shock . Intubated and emergently taken to cath lab for  temporary transvenous pacemaker and coronary angiogram.  Extubated 11/23 then reintubated four hours later, extubated 11/26.   Subjective: alert Assessment / Plan / Recommendation CHL IP CLINICAL IMPRESSIONS 06/14/2020 Clinical Impression Pt was seen for MBS which revealed a mild pharyngeal dysphagia. His oral phase of swallowing was characterized by timely manipulation and transit of POs. During the pharyngeal swallow, the pt demonstrated timely swallow initiation and adequate hyolaryngeal excursion. Epiglottic deflection was slightly reduced, leading to two instances of trace penetration/aspiration. Given 3 oz of thin liquid, one instance of trace penetration was observed during the swallow (PAS 2). While taking a pill with thin liquid, the pill became lodged in the valleculae. With additional sips of thin liquid to transit the pill, a trace amount of thin liquid entered the airway and fell below the vocal folds before being ejected out (PAS 6). During an esophageal sweep, the pill was observed to hesitate mid-esophagus. At this time,  a regular diet and thin liquids are recommended. Provide meds whole with puree. Pt has some R sided weakness that will require assistance with meals. SLP will f/u acutely for diet toleration.  SLP Visit Diagnosis Dysphagia, pharyngeal phase (R13.13) Attention and concentration deficit following -- Frontal lobe and executive function deficit following -- Impact on safety and function Mild aspiration risk   CHL IP TREATMENT RECOMMENDATION 06/14/2020 Treatment Recommendations Therapy as outlined in treatment plan below   Prognosis 06/14/2020 Prognosis for Safe Diet Advancement Good Barriers to Reach Goals -- Barriers/Prognosis Comment -- CHL IP DIET RECOMMENDATION 06/14/2020 SLP Diet Recommendations Regular solids;Thin liquid Liquid Administration via Cup;Straw Medication Administration Whole meds with puree Compensations Slow rate;Small sips/bites Postural Changes Seated upright at 90  degrees   CHL IP OTHER RECOMMENDATIONS 06/14/2020 Recommended Consults -- Oral Care Recommendations Oral care BID Other Recommendations --   CHL IP FOLLOW UP RECOMMENDATIONS 06/14/2020 Follow up Recommendations None   CHL IP FREQUENCY AND DURATION 06/14/2020 Speech Therapy Frequency (ACUTE ONLY) min 2x/week Treatment Duration 2 weeks      CHL IP ORAL PHASE 06/14/2020 Oral Phase WFL Oral - Pudding Teaspoon -- Oral - Pudding Cup -- Oral - Honey Teaspoon -- Oral - Honey Cup -- Oral - Nectar Teaspoon -- Oral - Nectar Cup -- Oral - Nectar Straw -- Oral - Thin Teaspoon -- Oral - Thin Cup -- Oral - Thin Straw -- Oral - Puree -- Oral - Mech Soft -- Oral - Regular -- Oral - Multi-Consistency -- Oral - Pill -- Oral Phase - Comment --  CHL IP PHARYNGEAL PHASE 06/14/2020 Pharyngeal Phase Impaired Pharyngeal- Pudding Teaspoon -- Pharyngeal -- Pharyngeal- Pudding Cup -- Pharyngeal -- Pharyngeal- Honey Teaspoon -- Pharyngeal -- Pharyngeal- Honey Cup -- Pharyngeal -- Pharyngeal- Nectar Teaspoon -- Pharyngeal -- Pharyngeal- Nectar Cup -- Pharyngeal -- Pharyngeal- Nectar Straw -- Pharyngeal -- Pharyngeal- Thin Teaspoon -- Pharyngeal -- Pharyngeal- Thin Cup WFL Pharyngeal -- Pharyngeal- Thin Straw Penetration/Aspiration during swallow Pharyngeal Material enters airway, remains ABOVE vocal cords then ejected out;Material enters airway, passes BELOW cords then ejected out Pharyngeal- Puree WFL Pharyngeal -- Pharyngeal- Mechanical Soft -- Pharyngeal -- Pharyngeal- Regular WFL Pharyngeal -- Pharyngeal- Multi-consistency -- Pharyngeal -- Pharyngeal- Pill Pharyngeal residue - valleculae Pharyngeal -- Pharyngeal Comment --  No flowsheet data found. Herbie Baltimore, MA CCC-SLP Acute Rehabilitation Services Pager [phone] Office [phone]  Lynann Beaver 06/14/2020, 10:55 AM              ECHOCARDIOGRAM COMPLETE  Result Date: 06/05/2020    ECHOCARDIOGRAM REPORT   Patient Name:   JAXSTON Arvie Date of Exam: 06/05/2020 Medical  Rec #:  [id number]      Height:       67.0 in Accession #:    [id number]     Weight:       204.8 lb Date of Birth:  [birth year]     BSA:          2.043 m Patient Age:    89 years       BP:           83/61 mmHg Patient Gender: M              HR:           70 bpm. Exam Location:  Inpatient Procedure: 2D Echo, Intracardiac Opacification Agent, Color Doppler and Cardiac            Doppler Indications:    Congestive Heart Failure 428.0 / I50.9  History:  Patient has prior history of Echocardiogram examinations, most                 recent 05/05/2020. CAD and Previous Myocardial Infarction, Prior                 CABG, Stroke; Risk Factors:Hypertension and Dyslipidemia.                 Transvenous pacemaker. Hiatal hernia.  Sonographer:    Darlina Sicilian RDCS Referring Phys: [id number] [place] E LIGGETT  Sonographer Comments: Suboptimal apical window and echo performed with patient supine and on artificial respirator. IMPRESSIONS  1. Technically difficult study with poor echo windows. Definity contrast given.  2. Left ventricular ejection fraction, by estimation, is 25 to 30%. The left ventricle has severely decreased function. The left ventricle demonstrates global hypokinesis with regional variation including apical dyskineisis. There is moderate left ventricular hypertrophy. Left ventricular diastolic parameters are indeterminate. Linear filling defect at the apex, could be thrombus or false tendon.  3. Right ventricular systolic function was not well visualized. The right ventricular size is not well visualized.  4. The mitral valve was not well visualized. No evidence of mitral valve regurgitation.  5. The aortic valve is tricuspid. Aortic valve regurgitation is trivial. Mild aortic valve sclerosis is present, with no evidence of aortic valve stenosis.  6. Aortic dilatation noted. There is borderline dilatation of the aortic root, measuring 39 mm. There is mild dilatation of the ascending aorta, measuring 40 mm.  Comparison(s): No significant change from prior study. 05/04/2020: LVEF 25-30%, 14 x 6 mm apical filing defect suspicious for LV thrombus. No change in apical filling defect. Suspect false tendon, however, cannot exclude thrombus given cardiomyopathy. FINDINGS  Left Ventricle: Linear filling defect at the apex, could be thrombus or false tendon. Left ventricular ejection fraction, by estimation, is 25 to 30%. The left ventricle has severely decreased function. The left ventricle demonstrates global hypokinesis. Mild dyskinesis of the left ventricular, apical apical segment. Definity contrast agent was given IV to delineate the left ventricular endocardial borders. The left ventricular internal cavity size was normal in size. There is moderate left ventricular hypertrophy. Left ventricular diastolic function could not be evaluated due to paced rhythm. Left ventricular diastolic parameters are indeterminate. Right Ventricle: The right ventricular size is not well visualized. Right vetricular wall thickness was not well visualized. Right ventricular systolic function was not well visualized. Left Atrium: Left atrial size was normal in size. Right Atrium: Right atrial size was not well visualized. Pericardium: There is no evidence of pericardial effusion. Mitral Valve: The mitral valve was not well visualized. No evidence of mitral valve regurgitation. Tricuspid Valve: The tricuspid valve is grossly normal. Tricuspid valve regurgitation is mild. Aortic Valve: The aortic valve is tricuspid. Aortic valve regurgitation is trivial. Mild aortic valve sclerosis is present, with no evidence of aortic valve stenosis. Pulmonic Valve: The pulmonic valve was normal in structure. Pulmonic valve regurgitation is not visualized. Aorta: Aortic dilatation noted. There is borderline dilatation of the aortic root, measuring 39 mm. There is mild dilatation of the ascending aorta, measuring 40 mm. Venous: IVC assessment for right atrial  pressure unable to be performed due to mechanical ventilation. IAS/Shunts: The interatrial septum was not well visualized. Additional Comments: A pacer wire is visualized.  LEFT VENTRICLE PLAX 2D LVIDd:         5.60 cm LVIDs:         5.10 cm LV PW:  1.30 cm LV IVS:        1.50 cm LVOT diam:     2.50 cm LV SV:         41 LV SV Index:   20 LVOT Area:     4.91 cm  LV Volumes (MOD) LV vol d, MOD A2C: 174.0 ml LV vol d, MOD A4C: 170.0 ml LV vol s, MOD A2C: 128.0 ml LV vol s, MOD A4C: 131.0 ml LV SV MOD A2C:     46.0 ml LV SV MOD A4C:     170.0 ml LV SV MOD BP:      44.5 ml RIGHT VENTRICLE RV S prime:     9.03 cm/s TAPSE (M-mode): 0.9 cm LEFT ATRIUM             Index LA diam:        3.20 cm 1.57 cm/m LA Vol (A2C):   49.9 ml 24.43 ml/m LA Vol (A4C):   60.9 ml 29.81 ml/m LA Biplane Vol: 59.4 ml 29.08 ml/m  AORTIC VALVE LVOT Vmax:   55.50 cm/s LVOT Vmean:  39.800 cm/s LVOT VTI:    0.083 m  AORTA Ao Root diam: 3.90 cm Ao Asc diam:  4.00 cm TRICUSPID VALVE TR Peak grad:   19.2 mmHg TR Vmax:        219.00 cm/s  SHUNTS Systemic VTI:  0.08 m Systemic Diam: 2.50 cm Lyman Bishop MD Electronically signed by Lyman Bishop MD Signature Date/Time: 06/05/2020/12:42:27 PM    Final    ECHOCARDIOGRAM LIMITED BUBBLE STUDY  Result Date: 06/14/2020    ECHOCARDIOGRAM LIMITED REPORT   Patient Name:   VALON Edgell Date of Exam: 06/14/2020 Medical Rec #:  [id number]      Height:       67.0 in Accession #:    [id number]     Weight:       194.4 lb Date of Birth:  1955-07-31     BSA:          1.998 m Patient Age:    70 years       BP:           167/114 mmHg Patient Gender: M              HR:           87 bpm. Exam Location:  Inpatient Procedure: Limited Echo and Saline Contrast Bubble Study Indications:    Stroke; bubble study to r/o PFO  History:        Patient has prior history of Echocardiogram examinations, most                 recent 06/07/2020. CAD and Previous Myocardial Infarction, Prior                 CABG; Risk  Factors:Hypertension.  Sonographer:    Mikki Santee RDCS (AE) Sonographer#2:  Clayton Lefort RDCS (AE) Referring Phys: [id number] RENEE LYNN [place]  1. Left ventricular ejection fraction, by gross estimation, is 25 to 30% (assessed in one view). The left ventricle has severely decreased function.  2. The right ventricular size is mildly enlarged.  3. Agitated saline contrast bubble study was negative, with no evidence of any interatrial shunt. Comparison(s): No significant change from prior study. Limited study- bubble- without evidence of interatrial shunt. FINDINGS  Left Ventricle: Left ventricular ejection fraction, by estimation, is 25 to 30%. The left ventricle has severely decreased function. Right Ventricle: The right ventricular size is mildly  enlarged. IAS/Shunts: Agitated saline contrast was given intravenously to evaluate for intracardiac shunting. Agitated saline contrast bubble study was negative, with no evidence of any interatrial shunt. Rudean Haskell MD Electronically signed by Rudean Haskell MD Signature Date/Time: 06/14/2020/12:42:02 PM    Final    CT HEAD CODE STROKE WO CONTRAST`  Result Date: 06/13/2020 CLINICAL DATA:  Code stroke. EXAM: CT HEAD WITHOUT CONTRAST TECHNIQUE: Contiguous axial images were obtained from the base of the skull through the vertex without intravenous contrast. COMPARISON:  CT head 03/24/2019 FINDINGS: Brain: Generalized atrophy most prominent the parietal lobes. Mild ventricular enlargement consistent with atrophy. Small hypodensity right inferior posterior cerebellum consistent with infarct. This was not present previously but is probably nonacute based on density. Negative for acute infarct, hemorrhage, mass. Vascular: Negative for hyperdense vessel. Atherosclerotic calcification in the distal left vertebral artery and basilar artery. Atherosclerotic calcification in the carotid artery bilaterally. Skull: Lytic lesions in the parietal bone  bilaterally, stable since 2014. Sinuses/Orbits: Paranasal sinuses clear. Periapical lucency around multiple upper teeth. Negative orbit Other: None ASPECTS ([facility] Stroke Program Early CT Score) - Ganglionic level infarction (caudate, lentiform nuclei, internal capsule, insula, M1-M3 cortex): 7 - Supraganglionic infarction (M4-M6 cortex): 3 Total score (0-10 with 10 being normal): 10 IMPRESSION: 1. No acute abnormality.  Generalized atrophy 2. Hypodensity right posterior cerebellum most consistent with chronic infarct, not present in 2020. 3. ASPECTS is 10 4. Code stroke imaging results were communicated on 06/13/2020 at 9:00 am to provider Rory Percy via Shea Evans Electronically Signed   By: Franchot Gallo M.D.   On: 06/13/2020 09:02   VAS [place] LOWER EXTREMITY VENOUS (DVT)  Result Date: 06/14/2020  Lower Venous DVT Study Indications: Pulmonary embolism.  Risk Factors: Confirmed PE. Anticoagulation: Heparin. Limitations: Bandages. Comparison Study: No prior studies. Performing Technologist: Oliver Hum RVT  Examination Guidelines: A complete evaluation includes B-mode imaging, spectral Doppler, color Doppler, and power Doppler as needed of all accessible portions of each vessel. Bilateral testing is considered an integral part of a complete examination. Limited examinations for reoccurring indications may be performed as noted. The reflux portion of the exam is performed with the patient in reverse Trendelenburg.  +---------+---------------+---------+-----------+----------+--------------+ RIGHT    CompressibilityPhasicitySpontaneityPropertiesThrombus Aging +---------+---------------+---------+-----------+----------+--------------+ CFV      Full           Yes      Yes                                 +---------+---------------+---------+-----------+----------+--------------+ SFJ      Full                                                         +---------+---------------+---------+-----------+----------+--------------+ FV Prox  Full                                                        +---------+---------------+---------+-----------+----------+--------------+ FV Mid   Full                                                        +---------+---------------+---------+-----------+----------+--------------+  FV DistalFull                                                        +---------+---------------+---------+-----------+----------+--------------+ PFV      Full                                                        +---------+---------------+---------+-----------+----------+--------------+ POP      Full           Yes      Yes                                 +---------+---------------+---------+-----------+----------+--------------+ PTV      Partial                                      Acute          +---------+---------------+---------+-----------+----------+--------------+ PERO     None                                         Acute          +---------+---------------+---------+-----------+----------+--------------+   +---------+---------------+---------+-----------+----------+--------------+ LEFT     CompressibilityPhasicitySpontaneityPropertiesThrombus Aging +---------+---------------+---------+-----------+----------+--------------+ CFV      Full           Yes      Yes                                 +---------+---------------+---------+-----------+----------+--------------+ SFJ      Full                                                        +---------+---------------+---------+-----------+----------+--------------+ FV Prox  Full                                                        +---------+---------------+---------+-----------+----------+--------------+ FV Mid   Full                                                         +---------+---------------+---------+-----------+----------+--------------+ FV DistalFull                                                        +---------+---------------+---------+-----------+----------+--------------+  PFV      Full                                                        +---------+---------------+---------+-----------+----------+--------------+ POP      Full           Yes      Yes                                 +---------+---------------+---------+-----------+----------+--------------+ PTV      Partial                                      Acute          +---------+---------------+---------+-----------+----------+--------------+ PERO     Full                                                        +---------+---------------+---------+-----------+----------+--------------+ Gastroc  None                                         Acute          +---------+---------------+---------+-----------+----------+--------------+     Summary: RIGHT: - Findings consistent with acute deep vein thrombosis involving the right posterior tibial veins, and right peroneal veins. - No cystic structure found in the popliteal fossa.  LEFT: - Findings consistent with acute deep vein thrombosis involving the left gastrocnemius veins, and left posterior tibial veins. - No cystic structure found in the popliteal fossa.  *See table(s) above for measurements and observations. Electronically signed by Harold Barban MD on 06/14/2020 at 8:52:42 PM.    Final    US Abdomen Limited RUQ (LIVER/GB)  Result Date: 06/17/2020 CLINICAL DATA:  Elevated liver enzymes EXAM: ULTRASOUND ABDOMEN LIMITED RIGHT UPPER QUADRANT COMPARISON:  None. FINDINGS: Gallbladder: There is gallbladder sludge without evidence for cholelithiasis or acute cholecystitis. There is no gallbladder wall thickening. The gallbladder is distended. Common bile duct: Diameter: 3 mm Liver: No focal lesion identified. Within normal  limits in parenchymal echogenicity. Portal vein is patent on color Doppler imaging with normal direction of blood flow towards the liver. Other: None. IMPRESSION: Gallbladder sludge without evidence for acute cholecystitis. Electronically Signed   By: Constance Holster M.D.   On: 06/17/2020 00:09    Microbiology: Recent Results (from the past 240 hour(s))  Surgical PCR screen     Status: None   Collection Time: 06/15/20 10:50 PM   Specimen: Nasal Mucosa; Nasal Swab  Result Value Ref Range Status   MRSA, PCR NEGATIVE NEGATIVE Final   Staphylococcus aureus NEGATIVE NEGATIVE Final    Comment: (NOTE) The Xpert SA Assay (FDA approved for NASAL specimens in patients 82 years of age and older), is one component of a comprehensive surveillance program. It is not intended to diagnose infection nor to guide or monitor treatment. Performed at [facility], [place] [address], [place], [place] [address]   Culture,  Urine     Status: None   Collection Time: 06/22/20  6:00 PM   Specimen: Urine, Random  Result Value Ref Range Status   Specimen Description URINE, RANDOM  Final   Special Requests NONE  Final   Culture   Final    NO GROWTH Performed at [facility], [address] [place], [place] [address]    Report Status 06/23/2020 FINAL  Final  Culture, blood (routine x 2)     Status: None (Preliminary result)   Collection Time: 06/25/20 12:06 AM   Specimen: BLOOD LEFT HAND  Result Value Ref Range Status   Specimen Description BLOOD LEFT HAND  Final   Special Requests   Final    BOTTLES DRAWN AEROBIC ONLY Blood Culture results may not be optimal due to an inadequate volume of blood received in culture bottles   Culture   Final    NO GROWTH < 12 HOURS Performed at [facility], [place] [address], [place], [place] [address]    Report Status PENDING  Incomplete  Culture, blood (routine x 2)     Status: None (Preliminary result)   Collection Time: 06/25/20 12:06 AM   Specimen:  BLOOD  Result Value Ref Range Status   Specimen Description BLOOD LEFT ANTECUBITAL  Final   Special Requests   Final    BOTTLES DRAWN AEROBIC ONLY Blood Culture results may not be optimal due to an inadequate volume of blood received in culture bottles   Culture   Final    NO GROWTH < 12 HOURS Performed at [facility], [place] [address], [place], [place] [address]    Report Status PENDING  Incomplete     Labs: Basic Metabolic Panel: Recent Labs  Lab 06/19/20 0202 06/20/20 0056 06/21/20 0144 06/22/20 0125 06/23/20 0114 06/24/20 0133 06/25/20 0006  NA 137 137 136 135 136 135 135  K 4.2 4.1 4.1 5.0 4.8 4.9 5.0  CL 104 103 102 101 101 101 101  CO2 23 25 22 26 23 24  20*  GLUCOSE 116* 114* 138* 112* 104* 110* 98  BUN 20 20 25* 34* 34* 40* 44*  CREATININE 1.07 1.12 1.05 1.17 1.16 1.34* 1.56*  CALCIUM 8.5* 8.7* 8.7* 9.0 9.2 9.2 9.3  MG 2.2 2.1 2.1 2.1 2.2  --   --   PHOS 3.1 3.6 4.0 3.9 3.9  --   --    Liver Function Tests: Recent Labs  Lab 06/21/20 0144 06/22/20 0125 06/23/20 0114 06/24/20 0133 06/25/20 0006  AST 38 55* 40 32 29  ALT 63* 68* 69* 60* 49*  ALKPHOS 60 63 65 65 68  BILITOT 0.9 1.0 0.9 1.0 1.0  PROT 7.0 7.0 7.4 7.2 7.5  ALBUMIN 3.1* 3.1* 3.3* 3.3* 3.3*   No results for input(s): LIPASE, AMYLASE in the last 168 hours. Recent Labs  Lab 06/19/20 0202 06/20/20 0056  AMMONIA 38* 38*   CBC: Recent Labs  Lab 06/21/20 0144 06/22/20 0125 06/23/20 0114 06/24/20 0133 06/25/20 0006  WBC 13.3* 14.2* 12.8* 13.6* 12.5*  NEUTROABS 8.6* 9.2* 7.9* 8.7* 7.3  HGB 14.2 14.3 14.9 15.3 15.3  HCT 43.0 43.9 45.4 44.1 44.7  MCV 93.7 93.2 92.5 90.7 91.8  PLT 515* 542* 578* 627* 665*   Cardiac Enzymes: No results for input(s): CKTOTAL, CKMB, CKMBINDEX, TROPONINI in the last 168 hours. BNP: BNP (last 3 results) Recent Labs    05/03/20 2330 05/04/20 0536  BNP 1,169.8* 1,408.4*    ProBNP (last  3 results) No results for input(s): PROBNP in the last 8760  hours.  CBG: Recent Labs  Lab 06/24/20 1211 06/24/20 1703 06/24/20 2128 06/25/20 0611 06/25/20 1325  GLUCAP 158* 137* 108* 114* 142*       Signed:  Alma Friendly, MD [facility] 06/25/2020, 1:31 PM

## 2020-06-25 NOTE — Progress Notes (Signed)
Mobility Specialist - Progress Note   06/25/20 1221  Mobility  Activity Ambulated in hall  Level of Assistance Standby assist, set-up cues, supervision of patient - no hands on  Assistive Device Front wheel walker  Distance Ambulated (ft) 500 ft  Mobility Response Tolerated well  Mobility performed by Mobility specialist  $Mobility charge 1 Mobility    Pre-mobility: 70 HR During mobility: 84 HR Post-mobility: 67 HR  Asx throughout ambulation. To recliner after walk.  Pricilla Handler Mobility Specialist Mobility Specialist Phone: [phone]

## 2020-06-25 NOTE — Progress Notes (Signed)
Progress Note  Patient Name: [name] Date of Encounter: 06/25/2020  Primary Cardiologist: Sanda Klein, MD   Subjective   Briefly: 64 yo WM PMHx CAD w/ previous CABG in 2014 and recent 10/21 STEMI, chronic combined systolic and diastolic HF/ ICM with biventricular dysfunction, HTN, HLD, CVA. After DC 10/23 on a LifeVest, but was not using it. Presented both with VT and bradycardia on 06/04/20 requiring ICD implant (St. Jude ICD 06/19/20).  Patient  getting his strength up and planned for rehab.  06/23/20 had new AKI.  BP has been stable through course (SBP 90-130).  With this, patient has been able to ambulate with PT.  Patient has had relatively new AKI (baseline Cr 1.12).  Cardiology called for optimization of medical management prior to DC to rehab.  Patient notes that he is doing well.   No chest pain or pressure .  No SOB/DOE and no PND/Orthopnea.  No leg swelling.  No palpitations or syncope.  He just finished walking the hall with PT and feels well.  Inpatient Medications    Scheduled Meds: .  stroke: mapping our early stages of recovery book   Does not apply Once  . (feeding supplement) PROSource Plus  30 mL Oral BID BM  . amiodarone  200 mg Oral BID  . apixaban  5 mg Oral BID  . aspirin EC  81 mg Oral Daily  . atorvastatin  80 mg Oral q1800  . chlorhexidine  15 mL Mouth Rinse BID  . Chlorhexidine Gluconate Cloth  6 each Topical Daily  . clopidogrel  75 mg Oral Daily  . digoxin  0.125 mg Oral Daily  . escitalopram  10 mg Oral Daily  . ezetimibe  10 mg Oral Daily  . feeding supplement  237 mL Oral QID  . hydrALAZINE  10 mg Oral Q8H  . mouth rinse  15 mL Mouth Rinse q12n4p  . metoprolol succinate  25 mg Oral Daily  . sodium chloride flush  3 mL Intravenous Q12H  . spironolactone  25 mg Oral Daily   Continuous Infusions:  PRN Meds: acetaminophen, albuterol, guaiFENesin, loperamide, nitroGLYCERIN, ondansetron (ZOFRAN) IV   Vital Signs    Vitals:    06/24/20 1935 06/25/20 0120 06/25/20 0454 06/25/20 0832  BP: 102/77 106/75 120/78 99/84  Pulse: (!) 57 (!) 55 64 61  Resp: 20 20 17    Temp: 98.4 F (36.9 C) 98.4 F (36.9 C) 98 F (36.7 C) 97.7 F (36.5 C)  TempSrc: Oral Oral Oral Oral  SpO2: 94% 97% 93% 92%  Weight:      Height:        Intake/Output Summary (Last 24 hours) at 06/25/2020 1221 Last data filed at 06/25/2020 1025 Gross per 24 hour  Intake 410 ml  Output 825 ml  Net -415 ml   Filed Weights   06/22/20 0425 06/23/20 0221 06/24/20 0324  Weight: 84.6 kg 84.4 kg 82.5 kg    Telemetry    SR with APVS rhythm - Personally Reviewed  ECG    No new - Personally Reviewed  Physical Exam   GEN: No acute distress.   Neck: No JVD, R CVL dressing removed Cardiac: RRR, no murmurs, rubs, or gallops.  Respiratory: Clear to auscultation bilaterally. GI: Soft, nontender, non-distended  MS: No edema; No deformity. Neuro:  Nonfocal  Psych: Normal affect  INTEGUMENT: L sided ICD site looks good without hematoma  Labs    Chemistry Recent Labs  Lab 06/23/20 0114 06/24/20 0133 06/25/20 0006  NA 136 135 135  K 4.8 4.9 5.0  CL 101 101 101  CO2 23 24 20*  GLUCOSE 104* 110* 98  BUN 34* 40* 44*  CREATININE 1.16 1.34* 1.56*  CALCIUM 9.2 9.2 9.3  PROT 7.4 7.2 7.5  ALBUMIN 3.3* 3.3* 3.3*  AST 40 32 29  ALT 69* 60* 49*  ALKPHOS 65 65 68  BILITOT 0.9 1.0 1.0  GFRNONAA >60 58* 49*  ANIONGAP 12 10 14     Hematology Recent Labs  Lab 06/23/20 0114 06/24/20 0133 06/25/20 0006  WBC 12.8* 13.6* 12.5*  RBC 4.91 4.86 4.87  HGB 14.9 15.3 15.3  HCT 45.4 44.1 44.7  MCV 92.5 90.7 91.8  MCH 30.3 31.5 31.4  MCHC 32.8 34.7 34.2  RDW 13.3 13.3 13.4  PLT 578* 627* 665*    Cardiac EnzymesNo results for input(s): TROPONINI in the last 168 hours. No results for input(s): TROPIPOC in the last 168 hours.   BNPNo results for input(s): BNP, PROBNP in the last 168 hours.   DDimer No results for input(s): DDIMER in the last 168  hours.   Radiology    DG Chest Port 1 View  Result Date: 06/24/2020 CLINICAL DATA:  Leukocytosis. EXAM: PORTABLE CHEST 1 VIEW COMPARISON:  June 20, 2020. FINDINGS: The heart size and mediastinal contours are within normal limits. Both lungs are clear. Left-sided pacemaker is unchanged in position. Sternotomy wires are noted. No pneumothorax or pleural effusion is noted. The visualized skeletal structures are unremarkable. IMPRESSION: No active disease. Electronically Signed   By: Marijo Conception M.D.   On: 06/24/2020 08:36    Cardiac Studies   1. Technically difficult study with poor echo windows. Definity contrast  given.  2. Left ventricular ejection fraction, by estimation, is 25 to 30%. The  left ventricle has severely decreased function. The left ventricle  demonstrates global hypokinesis with regional variation including apical  dyskineisis. There is moderate left  ventricular hypertrophy. Left ventricular diastolic parameters are  indeterminate. Linear filling defect at the apex, could be thrombus or  false tendon.  3. Right ventricular systolic function was not well visualized. The right  ventricular size is not well visualized.  4. The mitral valve was not well visualized. No evidence of mitral valve  regurgitation.  5. The aortic valve is tricuspid. Aortic valve regurgitation is trivial.  Mild aortic valve sclerosis is present, with no evidence of aortic valve  stenosis.  6. Aortic dilatation noted. There is borderline dilatation of the aortic  root, measuring 39 mm. There is mild dilatation of the ascending aorta,  measuring 40 mm.   Patient Profile     64 y.o. male biventricular heart failure, VT, TAA, who is nearing discharge  Assessment & Plan     Heart Failure reduced Ejection Fraction; biventricular failure Acute Kidney Injury - NYHA class I, Stage C, euvolemic, etiology from ischemia - Diuretic regimen: on no diuretic - Strict I/Os, daily weights,  and fluid restriction of < 2 L  - will check digoxin level tomorrow increase continuing AKI - - Continue metoprolol succinate 25 mg PO daily - Would hold ARNI and aldactone in the setting of AKI; will need at outpatient re-challenge once AKI improves - continue hydralazine for afterload reduction - SGLT2i as outpatient would be reasonable - Device Indications: has St. Jude ICD - Follow up  (07/18/19 visit on place)   Coronary Artery Disease; Obstructive Pulmonary Embolism on Eliquis - asymptomatic  - anatomy: LIMA to LAD, SVG-OM 2, SVG-RCA;  2019 s/p angioplasty to proximal SVG-RCA and DES to SVG-RCA anastomosis c. 04/2020: recurrent STEMI with angioplasty of SVG-RCA  - continue plavix 75 mg PO daily; Continue ASA until 07/01/20 - continue ezetimibe, goal LDL < 70 - continue BB as above - ARNI as above - discussed cardiac rehab  Ventricular Tachycardia - Continue PO Amiodarone dosing - will need EP follow up (06/29/20 visit in place) - s/p Device functioning well per EP - discussed ICD site safety, no creams, no showering until site is well healed, steri-strips may come off on their own  Asymptomatic thoracic aortic aneurysm - Last at 43 mm from 06/13/20 CTPE, rate of growth ~ 36mm , indexed diameter 2.5 moderate risk - Last scan 05/2020; will need outpatient follow up - 1st degree relative one time screening (daughter has been screened does not have TAA) - Discussed not using Fluoroquinolones if possible  Time Spent Directly with Patient:   I have spent a total of 35with the patient reviewing hospital notes, telemetry, EKGs, labs and examining the patient as well as establishing an assessment and plan that was discussed personally with the patient.  > 50% of time was spent in direct patient care.  Discussed with patient, wife, primary MD and briefly with AHF team.   For questions or updates, please contact [facility] Please consult [email or web address] for contact info under  Cardiology/STEMI.      Signed, Werner Lean, MD  06/25/2020, 12:21 PM

## 2020-06-25 NOTE — H&P (Addendum)
Physical Medicine and Rehabilitation Admission H&P    Chief Complaint  Patient presents with  . Dizziness    Vtach  : HPI: [name] is a 64 year old right-handed male with history of CAD status post CABG recent non-STEMI 10/20 with revascularization and question of LV thrombus and ejection fraction 25% therefore LifeVest was placed. Per chart review patient lives with spouse. Mobile home with 4 steps to entry. Reportedly independent prior to admission. Presented 06/04/2020 with VT arrest in setting of severe ICM and treated with amiodarone, lidocaine, atropine as well as external pacing and was intubated for airway protection. He underwent cardiac catheterization showing normal anatomy. Echocardiogram ejection fraction 25 to 30% with linear filling defect was started on IV heparin due to concerns of thrombus. He had difficulty with vent wean developed fevers leukocytosis purulent respiratory secretions and placed on broad-spectrum antibiotics. TEE 06/07/2020 showed ejection fraction 30 to 35% no thrombus, PFO or ASD noted. Cardiology follow-up question plans for ICD placement. He tolerated extubation nasogastric tube in place for nutritional support. On 06/13/2020 he was found to have a right upper extremity weakness as well as speech difficulty with confusion code stroke initiated. CTA head neck was negative for LVO or ischemia but showed lobar and segmental pulmonary emboli in upper lungs. CTA lungs done for follow-up showed bilateral multifocal PE majority acute in addition to subacute and chronic thrombus in LLL PA. MRI of the brain showed small acute infarct in left frontal precentral gyrus and juxtacortical left frontal white matter with mild edema. Patient was placed on Eliquis. Cardiology follow-up and patient underwent [place] ICD dual-chamber pacemaker placement 06/19/2020. He is tolerating a regular diet however receiving supplemental nutritional support through nasogastric tube 14  hours a day. Admitting to CIR today with impaired mobility and ADLs. Was able to ambulate 500 feet with supervision today, HR well controlled, wife has been notified regarding admission.   Review of Systems  Constitutional: Negative for chills and fever.  HENT: Negative for hearing loss.   Eyes: Negative for blurred vision and double vision.  Respiratory: Positive for shortness of breath.   Cardiovascular: Positive for leg swelling. Negative for chest pain and palpitations.  Gastrointestinal: Positive for constipation and heartburn. Negative for nausea and vomiting.  Genitourinary: Negative for dysuria, flank pain and hematuria.  Musculoskeletal: Positive for falls and myalgias.  Skin: Negative for rash.  Neurological: Positive for weakness.  Psychiatric/Behavioral: Positive for depression and memory loss.  All other systems reviewed and are negative.  Past Medical History:  Diagnosis Date  . Borderline diabetic   . Coronary artery disease    a. s/p CABG in 2014 with LIMA-LAD, SVG-OM2 and SVG-RC b. 03/2019: STEMI and s/p angioplasty to proximal SVG-RCA and DES to SVG-RCA anastomosis c. 04/2020: recurrent STEMI with angioplasty of SVG-RCA  . Dyslipidemia 01/11/2013  . Hiatal hernia   . Hypertension   . Mini stroke ([place])    2  . S/P CABG x 3   . STEMI (ST elevation myocardial infarction) ([place])   . Stroke [facility])    Past Surgical History:  Procedure Laterality Date  . CARDIAC CATHETERIZATION    . CORONARY ARTERY BYPASS GRAFT N/A 09/01/2012   Procedure: CORONARY ARTERY BYPASS GRAFTING (CABG);  Surgeon: Gaye Pollack, MD;  Location: [facility];  Service: Open Heart Surgery;  Laterality: N/A;  Coronary Artery Bypass Grafting Times Three Using Left Internal Mammary Artery and Left Saphenous leg Vein Harvested Endoscopically  . CORONARY STENT INTERVENTION N/A 03/23/2019  Procedure: CORONARY STENT INTERVENTION;  Surgeon: Lorretta Harp, MD;  Location: [place] CV LAB;  Service:  Cardiovascular;  Laterality: N/A;  . CORONARY/GRAFT ACUTE MI REVASCULARIZATION N/A 03/23/2019   Procedure: Coronary/Graft Acute MI Revascularization;  Surgeon: Lorretta Harp, MD;  Location: [place] CV LAB;  Service: Cardiovascular;  Laterality: N/A;  . CORONARY/GRAFT ACUTE MI REVASCULARIZATION N/A 05/03/2020   Procedure: Coronary/Graft Acute MI Revascularization;  Surgeon: Belva Crome, MD;  Location: [place] CV LAB;  Service: Cardiovascular;  Laterality: N/A;  . IABP INSERTION N/A 06/04/2020   Procedure: IABP Insertion;  Surgeon: Martinique, Peter M, MD;  Location: [place] CV LAB;  Service: Cardiovascular;  Laterality: N/A;  . ICD IMPLANT N/A 06/19/2020   Procedure: ICD IMPLANT;  Surgeon: Vickie Epley, MD;  Location: [place] CV LAB;  Service: Cardiovascular;  Laterality: N/A;  . INTRAOPERATIVE TRANSESOPHAGEAL ECHOCARDIOGRAM N/A 09/01/2012   Procedure: INTRAOPERATIVE TRANSESOPHAGEAL ECHOCARDIOGRAM;  Surgeon: Gaye Pollack, MD;  Location: [place] OR;  Service: Open Heart Surgery;  Laterality: N/A;  . LEFT HEART CATH AND CORONARY ANGIOGRAPHY N/A 03/23/2019   Procedure: LEFT HEART CATH AND CORONARY ANGIOGRAPHY;  Surgeon: Lorretta Harp, MD;  Location: [place] CV LAB;  Service: Cardiovascular;  Laterality: N/A;  . LEFT HEART CATH AND CORONARY ANGIOGRAPHY N/A 05/03/2020   Procedure: LEFT HEART CATH AND CORONARY ANGIOGRAPHY;  Surgeon: Belva Crome, MD;  Location: [place] CV LAB;  Service: Cardiovascular;  Laterality: N/A;  . LEFT HEART CATHETERIZATION WITH CORONARY ANGIOGRAM N/A 08/31/2012   Procedure: LEFT HEART CATHETERIZATION WITH CORONARY ANGIOGRAM;  Surgeon: Peter M Martinique, MD;  Location: [facility] CATH LAB;  Service: Cardiovascular;  Laterality: N/A;  . PERCUTANEOUS CORONARY STENT INTERVENTION (PCI-S) N/A 08/31/2012   Procedure: PERCUTANEOUS CORONARY STENT INTERVENTION (PCI-S);  Surgeon: Peter M Martinique, MD;  Location: [facility] CATH LAB;  Service: Cardiovascular;  Laterality: N/A;  .  RIGHT/LEFT HEART CATH AND CORONARY/GRAFT ANGIOGRAPHY N/A 06/04/2020   Procedure: RIGHT/LEFT HEART CATH AND CORONARY/GRAFT ANGIOGRAPHY;  Surgeon: Martinique, Peter M, MD;  Location: [place] CV LAB;  Service: Cardiovascular;  Laterality: N/A;  . TEMPORARY PACEMAKER N/A 06/04/2020   Procedure: TEMPORARY PACEMAKER;  Surgeon: Martinique, Peter M, MD;  Location: [place] [facility];  Service: Cardiovascular;  Laterality: N/A;   Family History  Problem Relation Age of Onset  . Cancer Father   . Hypertension Father   . Hyperlipidemia Father   . Heart attack Father   . Hypertension Sister   . Hypertension Brother   . Hyperlipidemia Brother   . Heart attack Sister   . Heart attack Brother    Social History:  reports that he has quit smoking. His smoking use included pipe. He quit smokeless tobacco use about 51 years ago. He reports that he does not drink alcohol and does not use drugs. Allergies: No Known Allergies Medications Prior to Admission  Medication Sig Dispense Refill  . aspirin 81 MG chewable tablet Chew 81 mg by mouth daily.    . clopidogrel (PLAVIX) 75 MG tablet Take 1 tablet (75 mg total) by mouth daily. 90 tablet 3  . escitalopram (LEXAPRO) 10 MG tablet Take 1 tablet (10 mg total) by mouth daily. 30 tablet 11  . ezetimibe (ZETIA) 10 MG tablet Take 1 tablet (10 mg total) by mouth daily. 90 tablet 3  . metoprolol succinate (TOPROL-XL) 50 MG 24 hr tablet Take 1.5 tablets (75 mg total) by mouth daily. Take with or immediately following a meal. 135 tablet 1  .  Multiple Vitamins-Minerals (ONE-A-DAY MENS 50+ ADVANTAGE) TABS Take 1 tablet by mouth daily with breakfast.    . nitroGLYCERIN (NITROSTAT) 0.4 MG SL tablet Place 1 tablet (0.4 mg total) under the tongue every 5 (five) minutes x 3 doses as needed for chest pain. 25 tablet 2  . Omega-3 Fatty Acids (FISH OIL) 1000 MG CAPS Take 1,000 mg by mouth in the morning and at bedtime.    . sacubitril-valsartan (ENTRESTO) 49-51 MG Take 1 tablet by  mouth 2 (two) times daily. 60 tablet 1  . ALPRAZolam (XANAX) 0.5 MG tablet Take 1 tablet (0.5 mg total) by mouth at bedtime as needed for anxiety. 20 tablet 0    Drug Regimen Review Drug regimen was reviewed and remains appropriate with no significant issues identified  Home: Home Living Family/patient expects to be discharged to:: Private residence Living Arrangements: Spouse/significant other Available Help at Discharge: Family Type of Home: Mobile home (modular home) Home Access: Stairs to enter Entrance Stairs-Number of Steps: 4 Entrance Stairs-Rails: Right,Left Home Layout: One level Bathroom Shower/Tub: Optometrist: Yes Home Equipment: None   Functional History: Prior Function Level of Independence: Independent Comments: Works partime  Functional Status:  Mobility: Bed Mobility Overal bed mobility: Needs Assistance Bed Mobility: Sidelying to Sit,Rolling Rolling: Min guard Sidelying to sit: HOB elevated,Min assist Supine to sit: HOB elevated,Min assist Sit to supine: Supervision,HOB elevated General bed mobility comments: cues to maintain precautions, physical assistance to bring trunk upright Transfers Overall transfer level: Needs assistance Equipment used: Rolling walker (2 wheeled) Transfers: Sit to/from [employer] Sit to Stand: Min guard Stand pivot transfers: Min guard General transfer comment: Cues for hand placement and not to push with L UE into standing. Ambulation/Gait Ambulation/Gait assistance: Min assist,Min guard Gait Distance (Feet): 450 Feet Assistive device: Rolling walker (2 wheeled) Gait Pattern/deviations: Decreased step length - left,Decreased step length - right,Trunk flexed,Step-through pattern,Shuffle General Gait Details: min A for steadying with RW, vc for proximity to RW and upright posture Gait velocity: slowed Gait velocity interpretation: <1.8 ft/sec, indicate  of risk for recurrent falls Stairs: Yes Stairs assistance: Min assist Stair Management: One rail Right Number of Stairs: 5 General stair comments: Mild unsteadiness min assistance to maintain balance.    ADL: ADL Overall ADL's : Needs assistance/impaired Eating/Feeding: Independent,Sitting Eating/Feeding Details (indicate cue type and reason): Patient able to eat lunch this date: regular diet with thin liquids. Grooming: Min guard,Standing Grooming Details (indicate cue type and reason): simulated with in room ambulation to/from b.room. declines need for acutal use but amb. in b.room and to recliner Upper Body Bathing: Supervision/ safety,Sitting Lower Body Bathing: Minimal assistance,Sit to/from stand Upper Body Dressing : Minimal assistance,Sitting Lower Body Dressing: Min guard,Sitting/lateral leans Lower Body Dressing Details (indicate cue type and reason): able to reach b les to adjust socks Toilet Transfer: Min Animal nutritionist Details (indicate cue type and reason): simulated via functionalmobilityu with RW, min guard fro safety Functional mobility during ADLs: Minimal assistance General ADL Comments: increased SOB noted however SpO2 96% post ambulation  Cognition: Cognition Overall Cognitive Status: Impaired/Different from baseline Arousal/Alertness: Awake/alert Orientation Level: Oriented X4 Attention: Focused,Sustained Focused Attention: Appears intact Sustained Attention: Impaired Memory: Impaired Memory Impairment: Storage deficit,Retrieval deficit Awareness: Impaired Awareness Impairment: Emergent impairment Cognition Arousal/Alertness: Awake/alert Behavior During Therapy: Flat affect Overall Cognitive Status: Impaired/Different from baseline Area of Impairment: Following commands,Problem solving,Safety/judgement Orientation Level: Person,Place,Situation Memory: Decreased short-term memory Following Commands: Follows multi-step commands with  increased time Safety/Judgement: Decreased  awareness of safety,Decreased awareness of deficits Awareness: Emergent Problem Solving: Difficulty sequencing,Requires verbal cues,Requires tactile cues General Comments: continues to need increased cuing for sequencing   Physical Exam: Blood pressure 99/84, pulse 61, temperature 97.7 F (36.5 C), temperature source Oral, resp. rate 17, height 5\' 7"  (1.702 m), weight 82.5 kg, SpO2 92 %. Physical Exam General: Alert and oriented x 3, No apparent distress HEENT: Head is normocephalic, atraumatic, PERRLA, EOMI, sclera anicteric, oral mucosa pink and moist, dentition intact, ext ear canals clear,  Neck: Supple without JVD or lymphadenopathy Heart: Reg rate and rhythm. No murmurs rubs or gallops Chest: CTA bilaterally without wheezes, rales, or rhonchi; no distress Abdomen: Soft, non-tender, non-distended, bowel sounds positive. Extremities: No clubbing, cyanosis, or edema. Pulses are 2+ Skin: Clean and intact without signs of breakdown Neuro: Patient is alert in no acute distress.  Makes eye contact with examiner follows commands.  Oriented x3. Decreased safety awareness, sequencing.  Fine motor coordination is intact. No tremors. Motor function is grossly 5/5 except 4/5 right sided hand grip. Psych: Pt's affect is appropriate. Pt is cooperative   Results for orders placed or performed during the hospital encounter of 06/04/20 (from the past 48 hour(s))  Glucose, capillary     Status: Abnormal   Collection Time: 06/23/20 12:45 PM  Result Value Ref Range   Glucose-Capillary 136 (H) 70 - 99 mg/dL    Comment: Glucose reference range applies only to samples taken after fasting for at least 8 hours.   Comment 1 Notify RN    Comment 2 Document in Chart   Glucose, capillary     Status: None   Collection Time: 06/23/20  5:09 PM  Result Value Ref Range   Glucose-Capillary 89 70 - 99 mg/dL    Comment: Glucose reference range applies only to samples  taken after fasting for at least 8 hours.   Comment 1 Notify RN    Comment 2 Document in Chart   Glucose, capillary     Status: Abnormal   Collection Time: 06/23/20  9:24 PM  Result Value Ref Range   Glucose-Capillary 101 (H) 70 - 99 mg/dL    Comment: Glucose reference range applies only to samples taken after fasting for at least 8 hours.  CBC with Differential/Platelet     Status: Abnormal   Collection Time: 06/24/20  1:33 AM  Result Value Ref Range   WBC 13.6 (H) 4.0 - 10.5 K/uL   RBC 4.86 4.22 - 5.81 MIL/uL   Hemoglobin 15.3 13.0 - 17.0 g/dL   HCT 44.1 39.0 - 52.0 %   MCV 90.7 80.0 - 100.0 fL   MCH 31.5 26.0 - 34.0 pg   MCHC 34.7 30.0 - 36.0 g/dL   RDW 13.3 11.5 - 15.5 %   Platelets 627 (H) 150 - 400 K/uL   nRBC 0.0 0.0 - 0.2 %   Neutrophils Relative % 63 %   Neutro Abs 8.7 (H) 1.7 - 7.7 K/uL   Lymphocytes Relative 23 %   Lymphs Abs 3.2 0.7 - 4.0 K/uL   Monocytes Relative 10 %   Monocytes Absolute 1.4 (H) 0.1 - 1.0 K/uL   Eosinophils Relative 2 %   Eosinophils Absolute 0.3 0.0 - 0.5 K/uL   Basophils Relative 1 %   Basophils Absolute 0.1 0.0 - 0.1 K/uL   Immature Granulocytes 1 %   Abs Immature Granulocytes 0.08 (H) 0.00 - 0.07 K/uL    Comment: Performed at [facility], [address]  [place], [place] [address]  Comprehensive metabolic panel     Status: Abnormal   Collection Time: 06/24/20  1:33 AM  Result Value Ref Range   Sodium 135 135 - 145 mmol/L   Potassium 4.9 3.5 - 5.1 mmol/L   Chloride 101 98 - 111 mmol/L   CO2 24 22 - 32 mmol/L   Glucose, Bld 110 (H) 70 - 99 mg/dL    Comment: Glucose reference range applies only to samples taken after fasting for at least 8 hours.   BUN 40 (H) 8 - 23 mg/dL   Creatinine, Ser 1.34 (H) 0.61 - 1.24 mg/dL   Calcium 9.2 8.9 - 10.3 mg/dL   Total Protein 7.2 6.5 - 8.1 g/dL   Albumin 3.3 (L) 3.5 - 5.0 g/dL   AST 32 15 - 41 U/L   ALT 60 (H) 0 - 44 U/L   Alkaline Phosphatase 65 38 - 126 U/L   Total Bilirubin 1.0 0.3 - 1.2  mg/dL   GFR, Estimated 58 (L) >60 mL/min    Comment: (NOTE) Calculated using the CKD-EPI Creatinine Equation (2021)    Anion gap 10 5 - 15    Comment: Performed at [facility], [place] [address] [place], [place] [address]  Glucose, capillary     Status: Abnormal   Collection Time: 06/24/20  6:10 AM  Result Value Ref Range   Glucose-Capillary 123 (H) 70 - 99 mg/dL    Comment: Glucose reference range applies only to samples taken after fasting for at least 8 hours.  Glucose, capillary     Status: Abnormal   Collection Time: 06/24/20 12:11 PM  Result Value Ref Range   Glucose-Capillary 158 (H) 70 - 99 mg/dL    Comment: Glucose reference range applies only to samples taken after fasting for at least 8 hours.   Comment 1 Notify RN    Comment 2 Document in Chart   Glucose, capillary     Status: Abnormal   Collection Time: 06/24/20  5:03 PM  Result Value Ref Range   Glucose-Capillary 137 (H) 70 - 99 mg/dL    Comment: Glucose reference range applies only to samples taken after fasting for at least 8 hours.   Comment 1 Notify RN    Comment 2 Document in Chart   Glucose, capillary     Status: Abnormal   Collection Time: 06/24/20  9:28 PM  Result Value Ref Range   Glucose-Capillary 108 (H) 70 - 99 mg/dL    Comment: Glucose reference range applies only to samples taken after fasting for at least 8 hours.  CBC with Differential/Platelet     Status: Abnormal   Collection Time: 06/25/20 12:06 AM  Result Value Ref Range   WBC 12.5 (H) 4.0 - 10.5 K/uL   RBC 4.87 4.22 - 5.81 MIL/uL   Hemoglobin 15.3 13.0 - 17.0 g/dL   HCT 44.7 39.0 - 52.0 %   MCV 91.8 80.0 - 100.0 fL   MCH 31.4 26.0 - 34.0 pg   MCHC 34.2 30.0 - 36.0 g/dL   RDW 13.4 11.5 - 15.5 %   Platelets 665 (H) 150 - 400 K/uL   nRBC 0.0 0.0 - 0.2 %   Neutrophils Relative % 57 %   Neutro Abs 7.3 1.7 - 7.7 K/uL   Lymphocytes Relative 29 %   Lymphs Abs 3.6 0.7 - 4.0 K/uL   Monocytes Relative 10 %   Monocytes Absolute 1.2 (H) 0.1  - 1.0 K/uL   Eosinophils Relative 2 %  Eosinophils Absolute 0.2 0.0 - 0.5 K/uL   Basophils Relative 1 %   Basophils Absolute 0.1 0.0 - 0.1 K/uL   Immature Granulocytes 1 %   Abs Immature Granulocytes 0.08 (H) 0.00 - 0.07 K/uL    Comment: Performed at [facility] [address], [place], [place] [address]  Comprehensive metabolic panel     Status: Abnormal   Collection Time: 06/25/20 12:06 AM  Result Value Ref Range   Sodium 135 135 - 145 mmol/L   Potassium 5.0 3.5 - 5.1 mmol/L   Chloride 101 98 - 111 mmol/L   CO2 20 (L) 22 - 32 mmol/L   Glucose, Bld 98 70 - 99 mg/dL    Comment: Glucose reference range applies only to samples taken after fasting for at least 8 hours.   BUN 44 (H) 8 - 23 mg/dL   Creatinine, Ser 1.56 (H) 0.61 - 1.24 mg/dL   Calcium 9.3 8.9 - 10.3 mg/dL   Total Protein 7.5 6.5 - 8.1 g/dL   Albumin 3.3 (L) 3.5 - 5.0 g/dL   AST 29 15 - 41 U/L   ALT 49 (H) 0 - 44 U/L   Alkaline Phosphatase 68 38 - 126 U/L   Total Bilirubin 1.0 0.3 - 1.2 mg/dL   GFR, Estimated 49 (L) >60 mL/min    Comment: (NOTE) Calculated using the CKD-EPI Creatinine Equation (2021)    Anion gap 14 5 - 15    Comment: Performed at [facility] [address] [place], [place] [address]  Procalcitonin - Baseline     Status: None   Collection Time: 06/25/20 12:06 AM  Result Value Ref Range   Procalcitonin <0.10 ng/mL    Comment:        Interpretation: PCT (Procalcitonin) <= 0.5 ng/mL: Systemic infection (sepsis) is not likely. Local bacterial infection is possible. (NOTE)       Sepsis PCT Algorithm           Lower Respiratory Tract                                      Infection PCT Algorithm    ----------------------------     ----------------------------         PCT < 0.25 ng/mL                PCT < 0.10 ng/mL          Strongly encourage             Strongly discourage   discontinuation of antibiotics    initiation of antibiotics    ----------------------------      -----------------------------       PCT 0.25 - 0.50 ng/mL            PCT 0.10 - 0.25 ng/mL               OR       >80% decrease in PCT            Discourage initiation of                                            antibiotics      Encourage discontinuation           of antibiotics    ----------------------------     -----------------------------  PCT >= 0.50 ng/mL              PCT 0.26 - 0.50 ng/mL               AND        <80% decrease in PCT             Encourage initiation of                                             antibiotics       Encourage continuation           of antibiotics    ----------------------------     -----------------------------        PCT >= 0.50 ng/mL                  PCT > 0.50 ng/mL               AND         increase in PCT                  Strongly encourage                                      initiation of antibiotics    Strongly encourage escalation           of antibiotics                                     -----------------------------                                           PCT <= 0.25 ng/mL                                                 OR                                        > 80% decrease in PCT                                      Discontinue / Do not initiate                                             antibiotics  Performed at [facility], [address] [place],  [address]   Culture, blood (routine x 2)     Status: None (Preliminary result)   Collection Time: 06/25/20 12:06 AM   Specimen: BLOOD LEFT HAND  Result Value Ref Range   Specimen Description BLOOD LEFT HAND    Special Requests      BOTTLES  DRAWN AEROBIC ONLY Blood Culture results may not be optimal due to an inadequate volume of blood received in culture bottles   Culture      NO GROWTH < 12 HOURS Performed at [facility] [address] [place], [place] [address]    Report Status PENDING   Culture, blood (routine x 2)     Status: None  (Preliminary result)   Collection Time: 06/25/20 12:06 AM   Specimen: BLOOD  Result Value Ref Range   Specimen Description BLOOD LEFT ANTECUBITAL    Special Requests      BOTTLES DRAWN AEROBIC ONLY Blood Culture results may not be optimal due to an inadequate volume of blood received in culture bottles   Culture      NO GROWTH < 12 HOURS Performed at [facility] [address] [place], [place] [address]    Report Status PENDING   Glucose, capillary     Status: Abnormal   Collection Time: 06/25/20  6:11 AM  Result Value Ref Range   Glucose-Capillary 114 (H) 70 - 99 mg/dL    Comment: Glucose reference range applies only to samples taken after fasting for at least 8 hours.   DG Chest Port 1 View  Result Date: 06/24/2020 CLINICAL DATA:  Leukocytosis. EXAM: PORTABLE CHEST 1 VIEW COMPARISON:  June 20, 2020. FINDINGS: The heart size and mediastinal contours are within normal limits. Both lungs are clear. Left-sided pacemaker is unchanged in position. Sternotomy wires are noted. No pneumothorax or pleural effusion is noted. The visualized skeletal structures are unremarkable. IMPRESSION: No active disease. Electronically Signed   By: Marijo Conception M.D.   On: 06/24/2020 08:36       Medical Problem List and Plan: 1. Right side weakness with altered mental status secondary to acute embolic left side infarcts  -patient may shower  -ELOS/Goals: modI/S 5-7 days. Currently requiring minA ADLs OT and requires MinA PT for mobility and SLP for cognitive deficits. Noted to have poor standing balance and decreased awareness of deficits and sequencing ability. 2.  Antithrombotics: -DVT/anticoagulation: Bilateral lobar segmental pulmonary emboli. Eliquis as directed  -antiplatelet therapy: Aspirin 81 mg daily 3. Pain Management: Tylenol as needed 4. Mood: Lexapro 10 mg daily  -antipsychotic agents: N/A 5. Neuropsych: This patient is capable of making decisions on his own behalf. 6.  Skin/Wound Care: Routine skin checks 7. Fluids/Electrolytes/Nutrition: Routine in and outs with follow-up chemistries. 8.  Recurrent VT complicated by cardiogenic shock.  Status post [place] ICD dual-chamber pacemaker placement 06/19/2020.  Continue amiodarone 200 mg twice daily, Entresto 97-103 mg twice daily, digoxin 0.125 mg daily, and Aldactone 25 mg daily, Toprol 25 mg daily. Continue daily weights.  9.  CAD status post CABG 12/15/2012 with recent inferior STEMI 05/03/2020-06/04/2020.  Continue aspirin therapy.  Follow-up cardiology services 10.  Hyperlipidemia.  Lipitor/Zetia 11.  Decreased nutritional support.  Albumin low at 3.3 on 12/12. Diet has been advanced to regular. Still with limited intake. Follow-up speech therapy/nutritional management.  12. Acute CVA: MRI showed infarcts involving the left frontal precentral gyrus and the juxtacortical left frontal white matter, mild associated edema without mass-effect. Continue Lipitor, aspirin, Plavix. Still with residual cognitive deficits- would benefit from SLP.  13. Elevated liver enzymes. ALT 49 on 12/12. History of ETOH use as per wife. Continue to monitor.   Lavon Paganini Angiulli, PA-C  I have personally performed a face to face diagnostic evaluation, including, but not limited to relevant history and physical exam  findings, of this patient and developed relevant assessment and plan.  Additionally, I have reviewed and concur with the physician assistant's documentation above.  Izora Ribas, MD 06/25/2020

## 2020-06-25 NOTE — Plan of Care (Signed)
0

## 2020-06-25 NOTE — H&P (Signed)
Physical Medicine and Rehabilitation Admission H&P  Right sided weakness and cognitive deficits following CVA : HPI: [name] is a 64 year old right-handed male with history of CAD status post CABG recent non-STEMI 10/20 with revascularization and question of LV thrombus and ejection fraction 25% therefore LifeVest was placed. Per chart review patient lives with spouse. Mobile home with 4 steps to entry. Reportedly independent prior to admission. Presented 06/04/2020 with VT arrest in setting of severe ICM and treated with amiodarone, lidocaine, atropine as well as external pacing and was intubated for airway protection. He underwent cardiac catheterization showing normal anatomy. Echocardiogram ejection fraction 25 to 30% with linear filling defect was started on IV heparin due to concerns of thrombus. He had difficulty with vent wean developed fevers leukocytosis purulent respiratory secretions and placed on broad-spectrum antibiotics. TEE 06/07/2020 showed ejection fraction 30 to 35% no thrombus, PFO or ASD noted. Cardiology follow-up question plans for ICD placement. He tolerated extubation nasogastric tube in place for nutritional support. On 06/13/2020 he was found to have a right upper extremity weakness as well as speech difficulty with confusion code stroke initiated. CTA head neck was negative for LVO or ischemia but showed lobar and segmental pulmonary emboli in upper lungs. CTA lungs done for follow-up showed bilateral multifocal PE majority acute in addition to subacute and chronic thrombus in LLL PA. MRI of the brain showed small acute infarct in left frontal precentral gyrus and juxtacortical left frontal white matter with mild edema. Patient was placed on Eliquis. Cardiology follow-up and patient underwent [place] ICD dual-chamber pacemaker placement 06/19/2020. He is tolerating a regular diet however receiving supplemental nutritional support through nasogastric tube 14 hours a day.  Admitting to CIR today with impaired mobility and ADLs. Was able to ambulate 500 feet with supervision today, HR well controlled, wife has been notified regarding admission.   Review of Systems  Constitutional: Negative for chills and fever.  HENT: Negative for hearing loss.   Eyes: Negative for blurred vision and double vision.  Respiratory: Positive for shortness of breath.   Cardiovascular: Positive for leg swelling. Negative for chest pain and palpitations.  Gastrointestinal: Positive for constipation and heartburn. Negative for nausea and vomiting.  Genitourinary: Negative for dysuria, flank pain and hematuria.  Musculoskeletal: Positive for falls and myalgias.  Skin: Negative for rash.  Neurological: Positive for weakness.  Psychiatric/Behavioral: Positive for depression and memory loss.  All other systems reviewed and are negative.  Past Medical History:  Diagnosis Date  . Borderline diabetic   . Coronary artery disease    a. s/p CABG in 2014 with LIMA-LAD, SVG-OM2 and SVG-RC b. 03/2019: STEMI and s/p angioplasty to proximal SVG-RCA and DES to SVG-RCA anastomosis c. 04/2020: recurrent STEMI with angioplasty of SVG-RCA  . Dyslipidemia 01/11/2013  . Hiatal hernia   . Hypertension   . Mini stroke ([place])    2  . S/P CABG x 3   . STEMI (ST elevation myocardial infarction) ([place])   . Stroke [facility])    Past Surgical History:  Procedure Laterality Date  . CARDIAC CATHETERIZATION    . CORONARY ARTERY BYPASS GRAFT N/A 09/01/2012   Procedure: CORONARY ARTERY BYPASS GRAFTING (CABG);  Surgeon: Gaye Pollack, MD;  Location: [place];  Service: Open Heart Surgery;  Laterality: N/A;  Coronary Artery Bypass Grafting Times Three Using Left Internal Mammary Artery and Left Saphenous leg Vein Harvested Endoscopically  . CORONARY STENT INTERVENTION N/A 03/23/2019   Procedure: CORONARY STENT INTERVENTION;  Surgeon: Gwenlyn Found,  Pearletha Forge, MD;  Location: [place] CV LAB;  Service: Cardiovascular;   Laterality: N/A;  . CORONARY/GRAFT ACUTE MI REVASCULARIZATION N/A 03/23/2019   Procedure: Coronary/Graft Acute MI Revascularization;  Surgeon: Lorretta Harp, MD;  Location: [place] CV LAB;  Service: Cardiovascular;  Laterality: N/A;  . CORONARY/GRAFT ACUTE MI REVASCULARIZATION N/A 05/03/2020   Procedure: Coronary/Graft Acute MI Revascularization;  Surgeon: Belva Crome, MD;  Location: [place] CV LAB;  Service: Cardiovascular;  Laterality: N/A;  . IABP INSERTION N/A 06/04/2020   Procedure: IABP Insertion;  Surgeon: Martinique, Peter M, MD;  Location: [place] CV LAB;  Service: Cardiovascular;  Laterality: N/A;  . ICD IMPLANT N/A 06/19/2020   Procedure: ICD IMPLANT;  Surgeon: Vickie Epley, MD;  Location: [place] CV LAB;  Service: Cardiovascular;  Laterality: N/A;  . INTRAOPERATIVE TRANSESOPHAGEAL ECHOCARDIOGRAM N/A 09/01/2012   Procedure: INTRAOPERATIVE TRANSESOPHAGEAL ECHOCARDIOGRAM;  Surgeon: Gaye Pollack, MD;  Location: [place] OR;  Service: Open Heart Surgery;  Laterality: N/A;  . LEFT HEART CATH AND CORONARY ANGIOGRAPHY N/A 03/23/2019   Procedure: LEFT HEART CATH AND CORONARY ANGIOGRAPHY;  Surgeon: Lorretta Harp, MD;  Location: [place] CV LAB;  Service: Cardiovascular;  Laterality: N/A;  . LEFT HEART CATH AND CORONARY ANGIOGRAPHY N/A 05/03/2020   Procedure: LEFT HEART CATH AND CORONARY ANGIOGRAPHY;  Surgeon: Belva Crome, MD;  Location: [place] CV LAB;  Service: Cardiovascular;  Laterality: N/A;  . LEFT HEART CATHETERIZATION WITH CORONARY ANGIOGRAM N/A 08/31/2012   Procedure: LEFT HEART CATHETERIZATION WITH CORONARY ANGIOGRAM;  Surgeon: Peter M Martinique, MD;  Location: [facility] CATH LAB;  Service: Cardiovascular;  Laterality: N/A;  . PERCUTANEOUS CORONARY STENT INTERVENTION (PCI-S) N/A 08/31/2012   Procedure: PERCUTANEOUS CORONARY STENT INTERVENTION (PCI-S);  Surgeon: Peter M Martinique, MD;  Location: [facility] CATH LAB;  Service: Cardiovascular;  Laterality: N/A;  . RIGHT/LEFT HEART  CATH AND CORONARY/GRAFT ANGIOGRAPHY N/A 06/04/2020   Procedure: RIGHT/LEFT HEART CATH AND CORONARY/GRAFT ANGIOGRAPHY;  Surgeon: Martinique, Peter M, MD;  Location: [place] CV LAB;  Service: Cardiovascular;  Laterality: N/A;  . TEMPORARY PACEMAKER N/A 06/04/2020   Procedure: TEMPORARY PACEMAKER;  Surgeon: Martinique, Peter M, MD;  Location: [place] CV LAB;  Service: Cardiovascular;  Laterality: N/A;   Family History  Problem Relation Age of Onset  . Cancer Father   . Hypertension Father   . Hyperlipidemia Father   . Heart attack Father   . Hypertension Sister   . Hypertension Brother   . Hyperlipidemia Brother   . Heart attack Sister   . Heart attack Brother    Social History:  reports that he has quit smoking. His smoking use included pipe. He quit smokeless tobacco use about 51 years ago. He reports that he does not drink alcohol and does not use drugs. Allergies: No Known Allergies Medications Prior to Admission  Medication Sig Dispense Refill  . amiodarone (PACERONE) 200 MG tablet Take 1 tablet (200 mg total) by mouth 2 (two) times daily.    Marland Kitchen apixaban (ELIQUIS) 5 MG TABS tablet Take 1 tablet (5 mg total) by mouth 2 (two) times daily. 60 tablet   . aspirin 81 MG chewable tablet Chew 81 mg by mouth daily.    Marland Kitchen atorvastatin (LIPITOR) 80 MG tablet TAKE 1 TABLET BY MOUTH DAILY AT 6 PM. 30 tablet 7  . clopidogrel (PLAVIX) 75 MG tablet Take 1 tablet (75 mg total) by mouth daily. 90 tablet 3  . [START ON 06/26/2020] digoxin (LANOXIN) 0.125 MG tablet Take 1  tablet (0.125 mg total) by mouth daily.    Marland Kitchen escitalopram (LEXAPRO) 10 MG tablet Take 1 tablet (10 mg total) by mouth daily. 30 tablet 11  . ezetimibe (ZETIA) 10 MG tablet Take 1 tablet (10 mg total) by mouth daily. 90 tablet 3  . feeding supplement (ENSURE ENLIVE / ENSURE PLUS) LIQD Take 237 mLs by mouth 4 (four) times daily. 237 mL 12  . metoprolol succinate (TOPROL-XL) 25 MG 24 hr tablet Take 1 tablet (25 mg total) by mouth daily. Take  with or immediately following a meal.    . Multiple Vitamins-Minerals (ONE-A-DAY MENS 50+ ADVANTAGE) TABS Take 1 tablet by mouth daily with breakfast.    . nitroGLYCERIN (NITROSTAT) 0.4 MG SL tablet Place 1 tablet (0.4 mg total) under the tongue every 5 (five) minutes x 3 doses as needed for chest pain. 25 tablet 2  . Nutritional Supplements (,FEEDING SUPPLEMENT, PROSOURCE PLUS) liquid Take 30 mLs by mouth 2 (two) times daily between meals.      Drug Regimen Review Drug regimen was reviewed and remains appropriate with no significant issues identified  Home: Home Living Family/patient expects to be discharged to:: Private residence Living Arrangements: Spouse/significant other Available Help at Discharge: Family Type of Home: Mobile home (modular home) Home Access: Stairs to enter Entrance Stairs-Number of Steps: 4 Entrance Stairs-Rails: Right,Left Home Layout: One level Bathroom Shower/Tub: Optometrist: Yes Home Equipment: None   Functional History: Prior Function Level of Independence: Independent Comments: Works partime  Functional Status:  Mobility: Bed Mobility Overal bed mobility: Needs Assistance Bed Mobility: Sidelying to Sit,Rolling Rolling: Min guard Sidelying to sit: HOB elevated,Min assist Supine to sit: HOB elevated,Min assist Sit to supine: Supervision,HOB elevated General bed mobility comments: cues to maintain precautions, physical assistance to bring trunk upright Transfers Overall transfer level: Needs assistance Equipment used: Rolling walker (2 wheeled) Transfers: Sit to/from [place] to Stand: Min guard Stand pivot transfers: Min guard General transfer comment: Cues for hand placement and not to push with L UE into standing. Ambulation/Gait Ambulation/Gait assistance: Min assist,Min guard Gait Distance (Feet): 450 Feet Assistive device: Rolling walker (2 wheeled) Gait  Pattern/deviations: Decreased step length - left,Decreased step length - right,Trunk flexed,Step-through pattern,Shuffle General Gait Details: min A for steadying with RW, vc for proximity to RW and upright posture Gait velocity: slowed Gait velocity interpretation: <1.8 ft/sec, indicate of risk for recurrent falls Stairs: Yes Stairs assistance: Min assist Stair Management: One rail Right Number of Stairs: 5 General stair comments: Mild unsteadiness min assistance to maintain balance.  ADL: ADL Overall ADL's : Needs assistance/impaired Eating/Feeding: Independent,Sitting Eating/Feeding Details (indicate cue type and reason): Patient able to eat lunch this date: regular diet with thin liquids. Grooming: Min guard,Standing Grooming Details (indicate cue type and reason): simulated with in room ambulation to/from b.room. declines need for acutal use but amb. in b.room and to recliner Upper Body Bathing: Supervision/ safety,Sitting Lower Body Bathing: Minimal assistance,Sit to/from stand Upper Body Dressing : Minimal assistance,Sitting Lower Body Dressing: Min guard,Sitting/lateral leans Lower Body Dressing Details (indicate cue type and reason): able to reach b les to adjust socks Toilet Transfer: Min Animal nutritionist Details (indicate cue type and reason): simulated via functionalmobilityu with RW, min guard fro safety Functional mobility during ADLs: Minimal assistance General ADL Comments: increased SOB noted however SpO2 96% post ambulation  Cognition: Cognition Overall Cognitive Status: Impaired/Different from baseline Arousal/Alertness: Awake/alert Orientation Level: Oriented X4 Attention: Focused,Sustained Focused Attention: Appears intact  Sustained Attention: Impaired Memory: Impaired Memory Impairment: Storage deficit,Retrieval deficit Awareness: Impaired Awareness Impairment: Emergent impairment Cognition Arousal/Alertness: Awake/alert Behavior  During Therapy: Flat affect Overall Cognitive Status: Impaired/Different from baseline Area of Impairment: Following commands,Problem solving,Safety/judgement Orientation Level: Person,Place,Situation Memory: Decreased short-term memory Following Commands: Follows multi-step commands with increased time Safety/Judgement: Decreased awareness of safety,Decreased awareness of deficits Awareness: Emergent Problem Solving: Difficulty sequencing,Requires verbal cues,Requires tactile cues General Comments: continues to need increased cuing for sequencing   Physical Exam: Blood pressure 120/80, pulse 61, temperature 97.8 F (36.6 C), temperature source Oral, resp. rate 16, SpO2 95 %. Physical Exam General: Alert and oriented x 3, No apparent distress HEENT: Head is normocephalic, atraumatic, PERRLA, EOMI, sclera anicteric, oral mucosa pink and moist, dentition intact, ext ear canals clear,  Neck: Supple without JVD or lymphadenopathy Heart: Reg rate and rhythm. No murmurs rubs or gallops Chest: CTA bilaterally without wheezes, rales, or rhonchi; no distress Abdomen: Soft, non-tender, non-distended, bowel sounds positive. Extremities: No clubbing, cyanosis, or edema. Pulses are 2+ Skin: Clean and intact without signs of breakdown Neuro: Patient is alert in no acute distress.  Makes eye contact with examiner follows commands.  Oriented x3. Decreased safety awareness, sequencing.  Fine motor coordination is intact. No tremors. Motor function is grossly 5/5 except 4/5 right sided hand grip. Psych: Pt's affect is appropriate. Pt is cooperative   Results for orders placed or performed during the hospital encounter of 06/04/20 (from the past 48 hour(s))  Glucose, capillary     Status: Abnormal   Collection Time: 06/23/20  9:24 PM  Result Value Ref Range   Glucose-Capillary 101 (H) 70 - 99 mg/dL    Comment: Glucose reference range applies only to samples taken after fasting for at least 8 hours.   CBC with Differential/Platelet     Status: Abnormal   Collection Time: 06/24/20  1:33 AM  Result Value Ref Range   WBC 13.6 (H) 4.0 - 10.5 K/uL   RBC 4.86 4.22 - 5.81 MIL/uL   Hemoglobin 15.3 13.0 - 17.0 g/dL   HCT 44.1 39.0 - 52.0 %   MCV 90.7 80.0 - 100.0 fL   MCH 31.5 26.0 - 34.0 pg   MCHC 34.7 30.0 - 36.0 g/dL   RDW 13.3 11.5 - 15.5 %   Platelets 627 (H) 150 - 400 K/uL   nRBC 0.0 0.0 - 0.2 %   Neutrophils Relative % 63 %   Neutro Abs 8.7 (H) 1.7 - 7.7 K/uL   Lymphocytes Relative 23 %   Lymphs Abs 3.2 0.7 - 4.0 K/uL   Monocytes Relative 10 %   Monocytes Absolute 1.4 (H) 0.1 - 1.0 K/uL   Eosinophils Relative 2 %   Eosinophils Absolute 0.3 0.0 - 0.5 K/uL   Basophils Relative 1 %   Basophils Absolute 0.1 0.0 - 0.1 K/uL   Immature Granulocytes 1 %   Abs Immature Granulocytes 0.08 (H) 0.00 - 0.07 K/uL    Comment: Performed at [facility], [address] [place], [place] [address]  Comprehensive metabolic panel     Status: Abnormal   Collection Time: 06/24/20  1:33 AM  Result Value Ref Range   Sodium 135 135 - 145 mmol/L   Potassium 4.9 3.5 - 5.1 mmol/L   Chloride 101 98 - 111 mmol/L   CO2 24 22 - 32 mmol/L   Glucose, Bld 110 (H) 70 - 99 mg/dL    Comment: Glucose reference range applies only to samples taken after fasting  for at least 8 hours.   BUN 40 (H) 8 - 23 mg/dL   Creatinine, Ser 1.34 (H) 0.61 - 1.24 mg/dL   Calcium 9.2 8.9 - 10.3 mg/dL   Total Protein 7.2 6.5 - 8.1 g/dL   Albumin 3.3 (L) 3.5 - 5.0 g/dL   AST 32 15 - 41 U/L   ALT 60 (H) 0 - 44 U/L   Alkaline Phosphatase 65 38 - 126 U/L   Total Bilirubin 1.0 0.3 - 1.2 mg/dL   GFR, Estimated 58 (L) >60 mL/min    Comment: (NOTE) Calculated using the CKD-EPI Creatinine Equation (2021)    Anion gap 10 5 - 15    Comment: Performed at [facility], [place] [address] [place], [place] [address]  Glucose, capillary     Status: Abnormal   Collection Time: 06/24/20  6:10 AM  Result Value Ref Range    Glucose-Capillary 123 (H) 70 - 99 mg/dL    Comment: Glucose reference range applies only to samples taken after fasting for at least 8 hours.  Glucose, capillary     Status: Abnormal   Collection Time: 06/24/20 12:11 PM  Result Value Ref Range   Glucose-Capillary 158 (H) 70 - 99 mg/dL    Comment: Glucose reference range applies only to samples taken after fasting for at least 8 hours.   Comment 1 Notify RN    Comment 2 Document in Chart   Glucose, capillary     Status: Abnormal   Collection Time: 06/24/20  5:03 PM  Result Value Ref Range   Glucose-Capillary 137 (H) 70 - 99 mg/dL    Comment: Glucose reference range applies only to samples taken after fasting for at least 8 hours.   Comment 1 Notify RN    Comment 2 Document in Chart   Glucose, capillary     Status: Abnormal   Collection Time: 06/24/20  9:28 PM  Result Value Ref Range   Glucose-Capillary 108 (H) 70 - 99 mg/dL    Comment: Glucose reference range applies only to samples taken after fasting for at least 8 hours.  CBC with Differential/Platelet     Status: Abnormal   Collection Time: 06/25/20 12:06 AM  Result Value Ref Range   WBC 12.5 (H) 4.0 - 10.5 K/uL   RBC 4.87 4.22 - 5.81 MIL/uL   Hemoglobin 15.3 13.0 - 17.0 g/dL   HCT 44.7 39.0 - 52.0 %   MCV 91.8 80.0 - 100.0 fL   MCH 31.4 26.0 - 34.0 pg   MCHC 34.2 30.0 - 36.0 g/dL   RDW 13.4 11.5 - 15.5 %   Platelets 665 (H) 150 - 400 K/uL   nRBC 0.0 0.0 - 0.2 %   Neutrophils Relative % 57 %   Neutro Abs 7.3 1.7 - 7.7 K/uL   Lymphocytes Relative 29 %   Lymphs Abs 3.6 0.7 - 4.0 K/uL   Monocytes Relative 10 %   Monocytes Absolute 1.2 (H) 0.1 - 1.0 K/uL   Eosinophils Relative 2 %   Eosinophils Absolute 0.2 0.0 - 0.5 K/uL   Basophils Relative 1 %   Basophils Absolute 0.1 0.0 - 0.1 K/uL   Immature Granulocytes 1 %   Abs Immature Granulocytes 0.08 (H) 0.00 - 0.07 K/uL    Comment: Performed at [facility], [address], [place], [place] [address]  Comprehensive  metabolic panel     Status: Abnormal   Collection Time: 06/25/20 12:06 AM  Result Value Ref Range  Sodium 135 135 - 145 mmol/L   Potassium 5.0 3.5 - 5.1 mmol/L   Chloride 101 98 - 111 mmol/L   CO2 20 (L) 22 - 32 mmol/L   Glucose, Bld 98 70 - 99 mg/dL    Comment: Glucose reference range applies only to samples taken after fasting for at least 8 hours.   BUN 44 (H) 8 - 23 mg/dL   Creatinine, Ser 1.56 (H) 0.61 - 1.24 mg/dL   Calcium 9.3 8.9 - 10.3 mg/dL   Total Protein 7.5 6.5 - 8.1 g/dL   Albumin 3.3 (L) 3.5 - 5.0 g/dL   AST 29 15 - 41 U/L   ALT 49 (H) 0 - 44 U/L   Alkaline Phosphatase 68 38 - 126 U/L   Total Bilirubin 1.0 0.3 - 1.2 mg/dL   GFR, Estimated 49 (L) >60 mL/min    Comment: (NOTE) Calculated using the CKD-EPI Creatinine Equation (2021)    Anion gap 14 5 - 15    Comment: Performed at [facility] [address], [place], [place] [address]  Procalcitonin - Baseline     Status: None   Collection Time: 06/25/20 12:06 AM  Result Value Ref Range   Procalcitonin <0.10 ng/mL    Comment:        Interpretation: PCT (Procalcitonin) <= 0.5 ng/mL: Systemic infection (sepsis) is not likely. Local bacterial infection is possible. (NOTE)       Sepsis PCT Algorithm           Lower Respiratory Tract                                      Infection PCT Algorithm    ----------------------------     ----------------------------         PCT < 0.25 ng/mL                PCT < 0.10 ng/mL          Strongly encourage             Strongly discourage   discontinuation of antibiotics    initiation of antibiotics    ----------------------------     -----------------------------       PCT 0.25 - 0.50 ng/mL            PCT 0.10 - 0.25 ng/mL               OR       >80% decrease in PCT            Discourage initiation of                                            antibiotics      Encourage discontinuation           of antibiotics    ----------------------------      -----------------------------         PCT >= 0.50 ng/mL              PCT 0.26 - 0.50 ng/mL               AND        <80% decrease in PCT             Encourage initiation of  antibiotics       Encourage continuation           of antibiotics    ----------------------------     -----------------------------        PCT >= 0.50 ng/mL                  PCT > 0.50 ng/mL               AND         increase in PCT                  Strongly encourage                                      initiation of antibiotics    Strongly encourage escalation           of antibiotics                                     -----------------------------                                           PCT <= 0.25 ng/mL                                                 OR                                        > 80% decrease in PCT                                      Discontinue / Do not initiate                                             antibiotics  Performed at [facility], [address] [place], [place] [address]   Culture, blood (routine x 2)     Status: None (Preliminary result)   Collection Time: 06/25/20 12:06 AM   Specimen: BLOOD LEFT HAND  Result Value Ref Range   Specimen Description BLOOD LEFT HAND    Special Requests      BOTTLES DRAWN AEROBIC ONLY Blood Culture results may not be optimal due to an inadequate volume of blood received in culture bottles   Culture      NO GROWTH < 12 HOURS Performed at [facility] [address] [place], [place] [address]    Report Status PENDING   Culture, blood (routine x 2)     Status: None (Preliminary result)   Collection Time: 06/25/20 12:06 AM   Specimen: BLOOD  Result Value Ref Range   Specimen Description BLOOD LEFT ANTECUBITAL    Special Requests      BOTTLES DRAWN AEROBIC ONLY Blood Culture  results may not be optimal due to an inadequate volume of blood received in culture bottles   Culture       NO GROWTH < 12 HOURS Performed at [facility] [address], [place], [place] [address]    Report Status PENDING   Glucose, capillary     Status: Abnormal   Collection Time: 06/25/20  6:11 AM  Result Value Ref Range   Glucose-Capillary 114 (H) 70 - 99 mg/dL    Comment: Glucose reference range applies only to samples taken after fasting for at least 8 hours.  Glucose, capillary     Status: Abnormal   Collection Time: 06/25/20  1:25 PM  Result Value Ref Range   Glucose-Capillary 142 (H) 70 - 99 mg/dL    Comment: Glucose reference range applies only to samples taken after fasting for at least 8 hours.   DG Chest Port 1 View  Result Date: 06/24/2020 CLINICAL DATA:  Leukocytosis. EXAM: PORTABLE CHEST 1 VIEW COMPARISON:  June 20, 2020. FINDINGS: The heart size and mediastinal contours are within normal limits. Both lungs are clear. Left-sided pacemaker is unchanged in position. Sternotomy wires are noted. No pneumothorax or pleural effusion is noted. The visualized skeletal structures are unremarkable. IMPRESSION: No active disease. Electronically Signed   By: Marijo Conception M.D.   On: 06/24/2020 08:36       Medical Problem List and Plan: 1. Right side weakness with altered mental status secondary to acute embolic left side infarcts  -patient may shower  -ELOS/Goals: modI/S 5-7 days. Currently requiring minA ADLs OT and requires MinA PT for mobility and SLP for cognitive deficits. Noted to have poor standing balance and decreased awareness of deficits and sequencing ability. 2.  Antithrombotics: -DVT/anticoagulation: Bilateral lobar segmental pulmonary emboli. Eliquis as directed  -antiplatelet therapy: Aspirin 81 mg daily 3. Pain Management: Tylenol as needed 4. Mood: Lexapro 10 mg daily  -antipsychotic agents: N/A 5. Neuropsych: This patient is capable of making decisions on his own behalf. 6. Skin/Wound Care: Routine skin checks 7. Fluids/Electrolytes/Nutrition: Routine  in and outs with follow-up chemistries. 8.  Recurrent VT complicated by cardiogenic shock.  Status post [place] ICD dual-chamber pacemaker placement 06/19/2020.  Continue amiodarone 200 mg twice daily, Entresto 97-103 mg twice daily, digoxin 0.125 mg daily, and Aldactone 25 mg daily, Toprol 25 mg daily. Continue daily weights.  9.  CAD status post CABG 12/15/2012 with recent inferior STEMI 05/03/2020-06/04/2020.  Continue aspirin therapy.  Follow-up cardiology services 10.  Hyperlipidemia.  Lipitor/Zetia 11.  Decreased nutritional support.  Albumin low at 3.3 on 12/12. Diet has been advanced to regular. Still with limited intake. Follow-up speech therapy/nutritional management.  12. Acute CVA: MRI showed infarcts involving the left frontal precentral gyrus and the juxtacortical left frontal white matter, mild associated edema without mass-effect. Continue Lipitor, aspirin, Plavix. Still with residual cognitive deficits- would benefit from SLP.  13. Elevated liver enzymes. ALT 49 on 12/12. History of ETOH use as per wife. Continue to monitor.   Lavon Paganini Angiulli, PA-C  I have personally performed a face to face diagnostic evaluation, including, but not limited to relevant history and physical exam findings, of this patient and developed relevant assessment and plan.  Additionally, I have reviewed and concur with the physician assistant's documentation above.  Izora Ribas, MD 06/25/2020

## 2020-06-26 ENCOUNTER — Inpatient Hospital Stay (HOSPITAL_COMMUNITY): Payer: Medicare HMO

## 2020-06-26 ENCOUNTER — Inpatient Hospital Stay (HOSPITAL_COMMUNITY): Payer: Medicare HMO | Admitting: Occupational Therapy

## 2020-06-26 DIAGNOSIS — I472 Ventricular tachycardia: Secondary | ICD-10-CM

## 2020-06-26 DIAGNOSIS — G8191 Hemiplegia, unspecified affecting right dominant side: Secondary | ICD-10-CM

## 2020-06-26 DIAGNOSIS — R7401 Elevation of levels of liver transaminase levels: Secondary | ICD-10-CM

## 2020-06-26 DIAGNOSIS — G479 Sleep disorder, unspecified: Secondary | ICD-10-CM

## 2020-06-26 DIAGNOSIS — N1831 Chronic kidney disease, stage 3a: Secondary | ICD-10-CM

## 2020-06-26 DIAGNOSIS — D72829 Elevated white blood cell count, unspecified: Secondary | ICD-10-CM

## 2020-06-26 DIAGNOSIS — N179 Acute kidney failure, unspecified: Secondary | ICD-10-CM

## 2020-06-26 LAB — DIGOXIN LEVEL: Digoxin Level: 1.3 ng/mL (ref 0.8–2.0)

## 2020-06-26 MED ORDER — MELATONIN 3 MG PO TABS
1.5000 mg | ORAL_TABLET | Freq: Every day | ORAL | Status: DC
  Administered 2020-06-26: 21:00:00 1.5 mg via ORAL
  Filled 2020-06-26: qty 1

## 2020-06-26 NOTE — Progress Notes (Signed)
Patient Details  Name: [name] MRN: [id number] Date of Birth: 03-22-1956  Today's Date: 06/26/2020  Hospital Problems: Principal Problem:   Cerebral infarction [facility]) Active Problems:   Status post CVA   Debility   AKI (acute kidney injury) ([place])   Leukocytosis   Right hemiparesis ([place])   Recurrent ventricular tachycardia (H[facility])   Transaminitis   Sleep disturbance  Past Medical History:  Past Medical History:  Diagnosis Date  . Borderline diabetic   . Coronary artery disease    a. s/p CABG in 2014 with LIMA-LAD, SVG-OM2 and SVG-RC b. 03/2019: STEMI and s/p angioplasty to proximal SVG-RCA and DES to SVG-RCA anastomosis c. 04/2020: recurrent STEMI with angioplasty of SVG-RCA  . Dyslipidemia 01/11/2013  . Hiatal hernia   . Hypertension   . Mini stroke ([place])    2  . S/P CABG x 3   . STEMI (ST elevation myocardial infarction) ([place])   . Stroke [facility])    Past Surgical History:  Past Surgical History:  Procedure Laterality Date  . CARDIAC CATHETERIZATION    . CORONARY ARTERY BYPASS GRAFT N/A 09/01/2012   Procedure: CORONARY ARTERY BYPASS GRAFTING (CABG);  Surgeon: Gaye Pollack, MD;  Location: [place];  Service: Open Heart Surgery;  Laterality: N/A;  Coronary Artery Bypass Grafting Times Three Using Left Internal Mammary Artery and Left Saphenous leg Vein Harvested Endoscopically  . CORONARY STENT INTERVENTION N/A 03/23/2019   Procedure: CORONARY STENT INTERVENTION;  Surgeon: Lorretta Harp, MD;  Location: [place] CV LAB;  Service: Cardiovascular;  Laterality: N/A;  . CORONARY/GRAFT ACUTE MI REVASCULARIZATION N/A 03/23/2019   Procedure: Coronary/Graft Acute MI Revascularization;  Surgeon: Lorretta Harp, MD;  Location: [place] CV LAB;  Service: Cardiovascular;  Laterality: N/A;  . CORONARY/GRAFT ACUTE MI REVASCULARIZATION N/A 05/03/2020   Procedure: Coronary/Graft Acute MI Revascularization;  Surgeon: Belva Crome, MD;  Location: [place] CV LAB;  Service:  Cardiovascular;  Laterality: N/A;  . IABP INSERTION N/A 06/04/2020   Procedure: IABP Insertion;  Surgeon: Martinique, Peter M, MD;  Location: [place] CV LAB;  Service: Cardiovascular;  Laterality: N/A;  . ICD IMPLANT N/A 06/19/2020   Procedure: ICD IMPLANT;  Surgeon: Vickie Epley, MD;  Location: [place] CV LAB;  Service: Cardiovascular;  Laterality: N/A;  . INTRAOPERATIVE TRANSESOPHAGEAL ECHOCARDIOGRAM N/A 09/01/2012   Procedure: INTRAOPERATIVE TRANSESOPHAGEAL ECHOCARDIOGRAM;  Surgeon: Gaye Pollack, MD;  Location: [place] OR;  Service: Open Heart Surgery;  Laterality: N/A;  . LEFT HEART CATH AND CORONARY ANGIOGRAPHY N/A 03/23/2019   Procedure: LEFT HEART CATH AND CORONARY ANGIOGRAPHY;  Surgeon: Lorretta Harp, MD;  Location: [place] CV LAB;  Service: Cardiovascular;  Laterality: N/A;  . LEFT HEART CATH AND CORONARY ANGIOGRAPHY N/A 05/03/2020   Procedure: LEFT HEART CATH AND CORONARY ANGIOGRAPHY;  Surgeon: Belva Crome, MD;  Location: [place] CV LAB;  Service: Cardiovascular;  Laterality: N/A;  . LEFT HEART CATHETERIZATION WITH CORONARY ANGIOGRAM N/A 08/31/2012   Procedure: LEFT HEART CATHETERIZATION WITH CORONARY ANGIOGRAM;  Surgeon: Peter M Martinique, MD;  Location: [facility] CATH LAB;  Service: Cardiovascular;  Laterality: N/A;  . PERCUTANEOUS CORONARY STENT INTERVENTION (PCI-S) N/A 08/31/2012   Procedure: PERCUTANEOUS CORONARY STENT INTERVENTION (PCI-S);  Surgeon: Peter M Martinique, MD;  Location: [facility] CATH LAB;  Service: Cardiovascular;  Laterality: N/A;  . RIGHT/LEFT HEART CATH AND CORONARY/GRAFT ANGIOGRAPHY N/A 06/04/2020   Procedure: RIGHT/LEFT HEART CATH AND CORONARY/GRAFT ANGIOGRAPHY;  Surgeon: Martinique, Peter M, MD;  Location: [place] CV LAB;  Service:  Cardiovascular;  Laterality: N/A;  . TEMPORARY PACEMAKER N/A 06/04/2020   Procedure: TEMPORARY PACEMAKER;  Surgeon: Martinique, Peter M, MD;  Location: [place] CV LAB;  Service: Cardiovascular;  Laterality: N/A;   Social History:   reports that he has quit smoking. His smoking use included pipe. He quit smokeless tobacco use about 51 years ago. He reports that he does not drink alcohol and does not use drugs.  Family / Support Systems Marital Status: Married Patient Roles: Spouse,Parent Spouse/Significant Other: [name] [phone] Other Supports: Friends and church members Anticipated Caregiver: wife Ability/Limitations of Caregiver: Wife is retired and in good health Caregiver Availability: 24/7 Family Dynamics: Close knit wth family who are suppportive and involved. Pt hopes to be independent by the tme he leaves here.  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 Wife will be visiting daily   Abuse/Neglect Abuse/Neglect Assessment Can Be Completed: Yes Physical Abuse: Denies Verbal Abuse: Denies Sexual Abuse: Denies Exploitation of patient/patient's resources: Denies Self-Neglect: Denies  Emotional Status Pt's affect, behavior and adjustment status: Pt is motivated to do well here and recover from his heart issues. He is deconditioned and hoping to get stronger while here. He has always been able to recover and be independent and hopes to again. Recent Psychosocial Issues: other health issues Psychiatric History: No issues deferred depresson screen he seems to be coping appropriately and able to verbalize his concerns and questions. Will follow along in case would benefit from seeing neruo-psych while here. Pt is very soft spoken and may benefit from seeing neuro-psych while here Substance Abuse History: No issues  Patient / Family Perceptions, Expectations & Goals Pt/Family understanding of illness & functional limitations: Pt and wife can explain his cardiac  issues and treatments while he has been in the hospital. He talks with the MD daily and feels his questions are beng answered and knows his plan. Premorbid pt/family roles/activities: husband, father, retiree, friend, church member, etc Anticipated changes in roles/activities/participation: resume Pt/family expectations/goals: Pt states: " I hope to be as independent as I can be before going home. 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 He will need to go up four steps into their home. Will await therapy team evaluations and work on discharge needs. Care Coordinator Anticipated Follow Up Needs: HH/OP  Clinical Impression Pleasant very soft spoken gentleman who is willing to work and wants to get his function back and be independent again. His wife is involved and able to assist him if needed. Will await therapy evaluations and work on discharge needs. May benefit from seeing neuro-psych while here, will get input from the team.  Elease Hashimoto 06/26/2020, 1:04 PM

## 2020-06-26 NOTE — Evaluation (Signed)
Physical Therapy Assessment and Plan  Patient Details  Name: [name] MRN: [id number] Date of Birth: 09-18-1955  PT Diagnosis: Abnormality of gait, Difficulty walking and Muscle weakness Rehab Potential: Good ELOS: 5-7 days   Today's Date: 06/26/2020 PT Individual Time: 1100-1200 PT Individual Time Calculation (min): 60 min    Hospital Problem: Principal Problem:   Cerebral infarction [facility]) Active Problems:   Status post CVA   Debility   Past Medical History:  Past Medical History:  Diagnosis Date  . Borderline diabetic   . Coronary artery disease    a. s/p CABG in 2014 with LIMA-LAD, SVG-OM2 and SVG-RC b. 03/2019: STEMI and s/p angioplasty to proximal SVG-RCA and DES to SVG-RCA anastomosis c. 04/2020: recurrent STEMI with angioplasty of SVG-RCA  . Dyslipidemia 01/11/2013  . Hiatal hernia   . Hypertension   . Mini stroke ([place])    2  . S/P CABG x 3   . STEMI (ST elevation myocardial infarction) ([place])   . Stroke [facility])    Past Surgical History:  Past Surgical History:  Procedure Laterality Date  . CARDIAC CATHETERIZATION    . CORONARY ARTERY BYPASS GRAFT N/A 09/01/2012   Procedure: CORONARY ARTERY BYPASS GRAFTING (CABG);  Surgeon: Gaye Pollack, MD;  Location: [place];  Service: Open Heart Surgery;  Laterality: N/A;  Coronary Artery Bypass Grafting Times Three Using Left Internal Mammary Artery and Left Saphenous leg Vein Harvested Endoscopically  . CORONARY STENT INTERVENTION N/A 03/23/2019   Procedure: CORONARY STENT INTERVENTION;  Surgeon: Lorretta Harp, MD;  Location: [place] CV LAB;  Service: Cardiovascular;  Laterality: N/A;  . CORONARY/GRAFT ACUTE MI REVASCULARIZATION N/A 03/23/2019   Procedure: Coronary/Graft Acute MI Revascularization;  Surgeon: Lorretta Harp, MD;  Location: [place] CV LAB;  Service: Cardiovascular;  Laterality: N/A;  . CORONARY/GRAFT ACUTE MI REVASCULARIZATION N/A 05/03/2020   Procedure: Coronary/Graft Acute MI Revascularization;   Surgeon: Belva Crome, MD;  Location: [place] CV LAB;  Service: Cardiovascular;  Laterality: N/A;  . IABP INSERTION N/A 06/04/2020   Procedure: IABP Insertion;  Surgeon: Martinique, Peter M, MD;  Location: [place] CV LAB;  Service: Cardiovascular;  Laterality: N/A;  . ICD IMPLANT N/A 06/19/2020   Procedure: ICD IMPLANT;  Surgeon: Vickie Epley, MD;  Location: [place] CV LAB;  Service: Cardiovascular;  Laterality: N/A;  . INTRAOPERATIVE TRANSESOPHAGEAL ECHOCARDIOGRAM N/A 09/01/2012   Procedure: INTRAOPERATIVE TRANSESOPHAGEAL ECHOCARDIOGRAM;  Surgeon: Gaye Pollack, MD;  Location: [place] OR;  Service: Open Heart Surgery;  Laterality: N/A;  . LEFT HEART CATH AND CORONARY ANGIOGRAPHY N/A 03/23/2019   Procedure: LEFT HEART CATH AND CORONARY ANGIOGRAPHY;  Surgeon: Lorretta Harp, MD;  Location: [place] CV LAB;  Service: Cardiovascular;  Laterality: N/A;  . LEFT HEART CATH AND CORONARY ANGIOGRAPHY N/A 05/03/2020   Procedure: LEFT HEART CATH AND CORONARY ANGIOGRAPHY;  Surgeon: Belva Crome, MD;  Location: [place] CV LAB;  Service: Cardiovascular;  Laterality: N/A;  . LEFT HEART CATHETERIZATION WITH CORONARY ANGIOGRAM N/A 08/31/2012   Procedure: LEFT HEART CATHETERIZATION WITH CORONARY ANGIOGRAM;  Surgeon: Peter M Martinique, MD;  Location: [facility] CATH LAB;  Service: Cardiovascular;  Laterality: N/A;  . PERCUTANEOUS CORONARY STENT INTERVENTION (PCI-S) N/A 08/31/2012   Procedure: PERCUTANEOUS CORONARY STENT INTERVENTION (PCI-S);  Surgeon: Peter M Martinique, MD;  Location: [facility] CATH LAB;  Service: Cardiovascular;  Laterality: N/A;  . RIGHT/LEFT HEART CATH AND CORONARY/GRAFT ANGIOGRAPHY N/A 06/04/2020   Procedure: RIGHT/LEFT HEART CATH AND CORONARY/GRAFT ANGIOGRAPHY;  Surgeon: Martinique, Peter M,  MD;  Location: [place] CV LAB;  Service: Cardiovascular;  Laterality: N/A;  . TEMPORARY PACEMAKER N/A 06/04/2020   Procedure: TEMPORARY PACEMAKER;  Surgeon: Martinique, Peter M, MD;  Location: [place] CV LAB;   Service: Cardiovascular;  Laterality: N/A;    Assessment & Plan Clinical Impression: Patient is a 64 year old right-handed male with history of CAD status post CABG recent non-STEMI 10/20 with revascularization and question of LV thrombus and ejection fraction 25% therefore LifeVest was placed. Per chart review patient lives with spouse. Mobile home with 4 steps to entry. Reportedly independent prior to admission. Presented 06/04/2020 with VT arrest in setting of severe ICM and treated with amiodarone, lidocaine, atropine as well as external pacing and was intubated for airway protection. He underwent cardiac catheterization showing normal anatomy. Echocardiogram ejection fraction 25 to 30% with linear filling defect was started on IV heparin due to concerns of thrombus. He had difficulty with vent wean developed fevers leukocytosis purulent respiratory secretions and placed on broad-spectrum antibiotics. TEE 06/07/2020 showed ejection fraction 30 to 35% no thrombus, PFO or ASD noted. Cardiology follow-up question plans for ICD placement. He tolerated extubation nasogastric tube in place for nutritional support. On 06/13/2020 he was found to have a right upper extremity weakness as well as speech difficulty with confusion code stroke initiated. CTA head neck was negative for LVO or ischemia but showed lobar and segmental pulmonary emboli in upper lungs. CTA lungs done for follow-up showed bilateral multifocal PE majority acute in addition to subacute and chronic thrombus in LLL PA. MRI of the brain showed small acute infarct in left frontal precentral gyrus and juxtacortical left frontal white matter with mild edema. Patient was placed on Eliquis. Cardiology follow-up and patient underwent [place] ICD dual-chamber pacemaker placement 06/19/2020. He is tolerating a regular diet however receiving supplemental nutritional support through nasogastric tube 14 hours a day. Admitting to CIR today with impaired  mobility and ADLs. Was able to ambulate 500 feet with supervision today, HR well controlled, wife has been notified regarding admission.  Patient transferred to CIR on 06/25/2020 .   Patient currently requires min with mobility secondary to muscle weakness, decreased cardiorespiratoy endurance and decreased standing balance and decreased balance strategies.  Prior to hospitalization, patient was independent  with mobility and lived with Spouse in a House home.  Home access is 4Stairs to enter.  Patient will benefit from skilled PT intervention to maximize safe functional mobility, minimize fall risk and decrease caregiver burden for planned discharge home with 24 hour supervision.  Anticipate patient will benefit from follow up OP at discharge.  PT - End of Session Activity Tolerance: Tolerates 30+ min activity without fatigue Endurance Deficit: Yes Endurance Deficit Description: Only a few seated rest breaks b/w functional mobility tasks PT Assessment Rehab Potential (ACUTE/IP ONLY): Good PT Barriers to Discharge: Home environment access/layout;Insurance for SNF coverage;Medication compliance PT Patient demonstrates impairments in the following area(s): Balance;Endurance;Motor;Safety PT Transfers Functional Problem(s): Bed Mobility;Bed to Chair;Car PT Locomotion Functional Problem(s): Ambulation;Stairs PT Plan PT Intensity: Minimum of 1-2 x/day ,45 to 90 minutes PT Frequency: 5 out of 7 days PT Duration Estimated Length of Stay: 5-7 days PT Treatment/Interventions: Ambulation/gait training;Cognitive remediation/compensation;Balance/vestibular training;Community reintegration;Discharge planning;Disease management/prevention;Neuromuscular re-education;DME/adaptive equipment instruction;Functional mobility training;Pain management;Patient/family education;Skin care/wound management;Psychosocial support;Splinting/orthotics;Therapeutic Activities;UE/LE Strength taining/ROM;Visual/perceptual  remediation/compensation;Wheelchair propulsion/positioning;UE/LE Coordination activities;Therapeutic Exercise;Stair training PT Transfers Anticipated Outcome(s): mod I with LRAD PT Locomotion Anticipated Outcome(s): mod I with LRAD PT Recommendation Follow Up Recommendations: Outpatient PT;24 hour supervision/assistance Patient destination: Home Equipment  Recommended: To be determined Equipment Details: Pt owns a cane and RW   PT Evaluation Precautions/Restrictions Precautions Precautions: Fall;ICD/Pacemaker Restrictions Weight Bearing Restrictions: No LUE Weight Bearing: Weight bearing as tolerated Other Position/Activity Restrictions: 5-10lb weight limit in Left arm, okay to lift arm overhead. Avoid extreme external rotation/abduction General Chart Reviewed: Yes Family/Caregiver Present: No Vital Signs  Pain Pain Assessment Pain Scale: 0-10 Pain Score: 0-No pain Home Living/Prior Functioning Home Living Available Help at Discharge: Family (Wife) Type of Home: House Home Access: Stairs to enter Technical brewer of Steps: 4 Entrance Stairs-Rails: Right;Left Home Layout: One level Bathroom Shower/Tub: Tub/shower unit  Lives With: Spouse Prior Function Level of Independence: Independent with gait;Independent with transfers  Able to Take Stairs?: Yes Driving: Yes Vocation: Retired Biomedical scientist: Dealer Leisure: Hobbies-yes (Comment) Comments: Works on cars at home Vision/Perception  Perception Perception: Within [place]: Intact  Cognition Overall Cognitive Status: Within Functional Limits for tasks assessed Arousal/Alertness: Awake/alert Orientation Level: Oriented X4 Sensation Sensation Light Touch: Appears Intact Hot/Cold: Appears Intact Proprioception: Appears Intact Stereognosis: Appears Intact Coordination Gross Motor Movements are Fluid and Coordinated: Yes Fine Motor Movements are Fluid and Coordinated:  Yes Finger Nose Finger Test: [facility] Heel Shin Test: [facility] Motor  Motor Motor: Within Functional Limits Motor - Skilled Clinical Observations: Mild delayed initiation   Trunk/Postural Assessment  Cervical Assessment Cervical Assessment: Exceptions to [facility] (forward head) Thoracic Assessment Thoracic Assessment: Exceptions to [facility] (rounded shoulders) Lumbar Assessment Lumbar Assessment: Exceptions to [facility] (posterior pelvic tilt) Postural Control Postural Control: Within Functional Limits  Balance Balance Balance Assessed: Yes Standardized Balance Assessment Standardized Balance Assessment: Berg Balance Test Berg Balance Test Sit to Stand: Able to stand without using hands and stabilize independently Standing Unsupported: Able to stand 2 minutes with supervision Sitting with Back Unsupported but Feet Supported on Floor or Stool: Able to sit safely and securely 2 minutes Stand to Sit: Sits safely with minimal use of hands Transfers: Able to transfer safely, definite need of hands Standing Unsupported with Eyes Closed: Able to stand 10 seconds with supervision Standing Ubsupported with Feet Together: Able to place feet together independently and stand for 1 minute with supervision From Standing, Reach Forward with Outstretched Arm: Can reach forward >12 cm safely (5") From Standing Position, Pick up Object from Floor: Able to pick up shoe, needs supervision From Standing Position, Turn to Look Behind Over each Shoulder: Looks behind from both sides and weight shifts well Turn 360 Degrees: Able to turn 360 degrees safely one side only in 4 seconds or less Standing Unsupported, Alternately Place Feet on Step/Stool: Able to stand independently and safely and complete 8 steps in 20 seconds Standing Unsupported, One Foot in Front: Able to plae foot ahead of the other independently and hold 30 seconds Standing on One Leg: Able to lift leg independently and hold 5-10 seconds Total Score:  47 Extremity Assessment  RUE Assessment RUE Assessment: Within Functional Limits LUE Assessment LUE Assessment: Within Functional Limits General Strength Comments: Deferred MMT due to pacemaker precautions RLE Assessment RLE Assessment: Within Functional Limits LLE Assessment LLE Assessment: Within Functional Limits  Care Tool Care Tool Bed Mobility Roll left and right activity   Roll left and right assist level: Supervision/Verbal cueing    Sit to lying activity   Sit to lying assist level: Contact Guard/Touching assist    Lying to sitting edge of bed activity   Lying to sitting edge of bed assist level: Contact Guard/Touching assist  Care Tool Transfers Sit to stand transfer   Sit to stand assist level: Contact Guard/Touching assist    Chair/bed transfer   Chair/bed transfer assist level: Contact Guard/Touching assist     Toilet transfer        Car transfer   Car transfer assist level: Contact Guard/Touching assist      Care Tool Locomotion Ambulation   Assist level: Minimal Assistance - Patient > 75% Assistive device: No Device Max distance: 572f  Walk 10 feet activity   Assist level: Minimal Assistance - Patient > 75% Assistive device: No Device   Walk 50 feet with 2 turns activity   Assist level: Minimal Assistance - Patient > 75% Assistive device: No Device  Walk 150 feet activity   Assist level: Minimal Assistance - Patient > 75% Assistive device: No Device  Walk 10 feet on uneven surfaces activity Walk 10 feet on uneven surfaces activity did not occur: N/A      Stairs   Assist level: Contact Guard/Touching assist Stairs assistive device: 2 hand rails Max number of stairs: 12  Walk up/down 1 step activity   Walk up/down 1 step (curb) assist level: Contact Guard/Touching assist Walk up/down 1 step or curb assistive device: 2 hand rails    Walk up/down 4 steps activity Walk up/down 4 steps assist level: Contact Guard/Touching assist Walk  up/down 4 steps assistive device: 2 hand rails  Walk up/down 12 steps activity   Walk up/down 12 steps assist level: Contact Guard/Touching assist Walk up/down 12 steps assistive device: 2 hand rails  Pick up small objects from floor   Pick up small object from the floor assist level: Contact Guard/Touching assist Pick up small object from the floor assistive device: n/a  Wheelchair Will patient use wheelchair at discharge?: No          Wheel 50 feet with 2 turns activity      Wheel 150 feet activity        Refer to Care Plan for Long Term Goals  SHORT TERM GOAL WEEK 1 PT Short Term Goal 1 (Week 1): STG = LTG due to ELOS  Recommendations for other services: Neuropsych  Skilled Therapeutic Intervention Mobility Bed Mobility Bed Mobility: Rolling Right;Rolling Left;Supine to Sit;Sit to Supine Rolling Right: Supervision/verbal cueing Rolling Left: Supervision/Verbal cueing Supine to Sit: Contact Guard/Touching assist Sit to Supine: Contact Guard/Touching assist Transfers Transfers: Sit to Stand;Stand to Sit;Stand Pivot Transfers Sit to Stand: Contact Guard/Touching assist Stand to Sit: Contact Guard/Touching assist Stand Pivot Transfers: Contact Guard/Touching assist Transfer (Assistive device): None Locomotion  Gait Ambulation: Yes Gait Assistance: Contact Guard/Touching assist;Minimal Assistance - Patient > 75% Gait Distance (Feet): 500 Feet Assistive device: None Gait Assistance Details: Verbal cues for precautions/safety;Verbal cues for gait pattern;Visual cues/gestures for precautions/safety Gait Assistance Details: x1 mild LOB that required minimal assist for correction, occured during head turns with gait Gait Gait: Yes Gait Pattern: Impaired Gait Pattern: Decreased step length - right;Decreased step length - left;Decreased stride length;Poor foot clearance - right;Poor foot clearance - left;Wide base of support Gait velocity: slowed Stairs / Additional  Locomotion Stairs: Yes Stairs Assistance: Contact Guard/Touching assist Stair Management Technique: Two rails Number of Stairs: 12 Height of Stairs: 6 Wheelchair Mobility Wheelchair Mobility: No  Skilled Intervention: Pt greeted sleeping in recliner, awakens easily to voice, agreeable to PT session. Initiated functional mobility as outlined above. He required CGA for transfers, CGA/supervision for bed mobility, was able to ambulate >5028fwith mostly CGA and no AD although  he had x1 mild LOB while ambulating with head turns/distractions. He performed car transfer with CGA via [place] and was able to negotiate x12 steps with CGA and 2 hand rails. He scored 47/56 on the BERG, indicating moderate falls risk. He also completed 5xSTS in 17 seconds, indicating increased falls risk. Pt was pleasant and cooperative throughout session, flat affect noted and unclear if this is baseline or related to his frontal infarct. He ended session semi-reclined in bed with bed alarm on and needs in reach.  Patient demonstrates increased fall risk as noted by score of   47/56 on Berg Balance Scale.  (<36= high risk for falls, close to 100%; 37-45 significant >80%; 46-51 moderate >50%; 52-55 lower >25%)  Instructed pt in results of PT evaluation as detailed above, PT POC, rehab potential, rehab goals, and discharge recommendations. Additionally discussed CIR's policies regarding fall safety and use of chair alarm and/or quick release belt. Pt verbalized understanding and in agreement. Will update pt's family members as they become available.   Discharge Criteria: Patient will be discharged from PT if patient refuses treatment 3 consecutive times without medical reason, if treatment goals not met, if there is a change in medical status, if patient makes no progress towards goals or if patient is discharged from hospital.  The above assessment, treatment plan, treatment alternatives and goals were discussed  and mutually agreed upon: by patient  Alger Simons PT, DPT 06/26/2020, 12:11 PM

## 2020-06-26 NOTE — Evaluation (Signed)
Speech Language Pathology Assessment and Plan  Patient Details  Name: [name] MRN: [id number] Date of Birth: 1956/02/22  SLP Diagnosis: Cognitive Impairments  Rehab Potential: Good ELOS: 5-7 days    Today's Date: 06/26/2020 SLP Individual Time: 1403-1500 SLP Individual Time Calculation (min): 43 min   Hospital Problem: Principal Problem:   Cerebral infarction [facility]) Active Problems:   Status post CVA   Debility   AKI (acute kidney injury) ([place])   Leukocytosis   Right hemiparesis ([place])   Recurrent ventricular tachycardia (HCC)   Transaminitis   Sleep disturbance  Past Medical History:  Past Medical History:  Diagnosis Date  . Borderline diabetic   . Coronary artery disease    a. s/p CABG in 2014 with LIMA-LAD, SVG-OM2 and SVG-RC b. 03/2019: STEMI and s/p angioplasty to proximal SVG-RCA and DES to SVG-RCA anastomosis c. 04/2020: recurrent STEMI with angioplasty of SVG-RCA  . Dyslipidemia 01/11/2013  . Hiatal hernia   . Hypertension   . Mini stroke ([place])    2  . S/P CABG x 3   . STEMI (ST elevation myocardial infarction) ([place])   . Stroke [facility])    Past Surgical History:  Past Surgical History:  Procedure Laterality Date  . CARDIAC CATHETERIZATION    . CORONARY ARTERY BYPASS GRAFT N/A 09/01/2012   Procedure: CORONARY ARTERY BYPASS GRAFTING (CABG);  Surgeon: Gaye Pollack, MD;  Location: [place];  Service: Open Heart Surgery;  Laterality: N/A;  Coronary Artery Bypass Grafting Times Three Using Left Internal Mammary Artery and Left Saphenous leg Vein Harvested Endoscopically  . CORONARY STENT INTERVENTION N/A 03/23/2019   Procedure: CORONARY STENT INTERVENTION;  Surgeon: Lorretta Harp, MD;  Location: [place] CV LAB;  Service: Cardiovascular;  Laterality: N/A;  . CORONARY/GRAFT ACUTE MI REVASCULARIZATION N/A 03/23/2019   Procedure: Coronary/Graft Acute MI Revascularization;  Surgeon: Lorretta Harp, MD;  Location: [place] CV LAB;  Service: Cardiovascular;   Laterality: N/A;  . CORONARY/GRAFT ACUTE MI REVASCULARIZATION N/A 05/03/2020   Procedure: Coronary/Graft Acute MI Revascularization;  Surgeon: Belva Crome, MD;  Location: [place] CV LAB;  Service: Cardiovascular;  Laterality: N/A;  . IABP INSERTION N/A 06/04/2020   Procedure: IABP Insertion;  Surgeon: Martinique, Peter M, MD;  Location: [place] CV LAB;  Service: Cardiovascular;  Laterality: N/A;  . ICD IMPLANT N/A 06/19/2020   Procedure: ICD IMPLANT;  Surgeon: Vickie Epley, MD;  Location: [place] CV LAB;  Service: Cardiovascular;  Laterality: N/A;  . INTRAOPERATIVE TRANSESOPHAGEAL ECHOCARDIOGRAM N/A 09/01/2012   Procedure: INTRAOPERATIVE TRANSESOPHAGEAL ECHOCARDIOGRAM;  Surgeon: Gaye Pollack, MD;  Location: [place] OR;  Service: Open Heart Surgery;  Laterality: N/A;  . LEFT HEART CATH AND CORONARY ANGIOGRAPHY N/A 03/23/2019   Procedure: LEFT HEART CATH AND CORONARY ANGIOGRAPHY;  Surgeon: Lorretta Harp, MD;  Location: [place] CV LAB;  Service: Cardiovascular;  Laterality: N/A;  . LEFT HEART CATH AND CORONARY ANGIOGRAPHY N/A 05/03/2020   Procedure: LEFT HEART CATH AND CORONARY ANGIOGRAPHY;  Surgeon: Belva Crome, MD;  Location: [place] CV LAB;  Service: Cardiovascular;  Laterality: N/A;  . LEFT HEART CATHETERIZATION WITH CORONARY ANGIOGRAM N/A 08/31/2012   Procedure: LEFT HEART CATHETERIZATION WITH CORONARY ANGIOGRAM;  Surgeon: Peter M Martinique, MD;  Location: [facility] CATH LAB;  Service: Cardiovascular;  Laterality: N/A;  . PERCUTANEOUS CORONARY STENT INTERVENTION (PCI-S) N/A 08/31/2012   Procedure: PERCUTANEOUS CORONARY STENT INTERVENTION (PCI-S);  Surgeon: Peter M Martinique, MD;  Location: [facility] CATH LAB;  Service: Cardiovascular;  Laterality: N/A;  .  RIGHT/LEFT HEART CATH AND CORONARY/GRAFT ANGIOGRAPHY N/A 06/04/2020   Procedure: RIGHT/LEFT HEART CATH AND CORONARY/GRAFT ANGIOGRAPHY;  Surgeon: Martinique, Peter M, MD;  Location: [place] CV LAB;  Service: Cardiovascular;  Laterality: N/A;   . TEMPORARY PACEMAKER N/A 06/04/2020   Procedure: TEMPORARY PACEMAKER;  Surgeon: Martinique, Peter M, MD;  Location: [place] CV LAB;  Service: Cardiovascular;  Laterality: N/A;    Assessment / Plan / Recommendation Clinical Impression Patient is a 64 year old right-handed male with history of CAD status post CABG recent non-STEMI 10/20 with revascularization and question of LV thrombus and ejection fraction 25% therefore LifeVest was placed. Per chart review patient lives with spouse. Mobile home with 4 steps to entry. Reportedly independent prior to admission. Presented 06/04/2020 with VT arrest in setting of severe ICM and treated with amiodarone, lidocaine, atropine as well as external pacing and was intubated for airway protection. He underwent cardiac catheterization showing normal anatomy. Echocardiogram ejection fraction 25 to 30% with linear filling defect was started on IV heparin due to concerns of thrombus. He had difficulty with vent wean developed fevers leukocytosis purulent respiratory secretions and placed on broad-spectrum antibiotics. TEE 06/07/2020 showed ejection fraction 30 to 35% no thrombus, PFO or ASD noted. Cardiology follow-up question plans for ICD placement. He tolerated extubation nasogastric tube in place for nutritional support. On 06/13/2020 he was found to have a right upper extremity weakness as well as speech difficulty with confusion code stroke initiated. CTA head neck was negative for LVO or ischemia but showed lobar and segmental pulmonary emboli in upper lungs. CTA lungs done for follow-up showed bilateral multifocal PE majority acute in addition to subacute and chronic thrombus in LLL PA. MRI of the brain showed small acute infarct in left frontal precentral gyrus and juxtacortical left frontal white matter with mild edema. Patient was placed on Eliquis. Cardiology follow-up and patient underwent [place] ICD dual-chamber pacemaker placement 06/19/2020. He is  tolerating a regular diet however receiving supplemental nutritional support through nasogastric tube 14 hours a day. Admitting to CIR today with impaired mobility and ADLs. Was able to ambulate 500 feet with supervision today, HR well controlled, wife has been notified regarding admission. Patient transferred to CIR on 06/25/2020 .   Pt presents with mild functional cognitive linguistic impairments, deficits include short term recall, executive function, emergent awareness, higher level attention and word finding skills. These deficits were further supported by formal cognitive linguistic assessment utilizing CLQT in which pt scored moderate impairments memory and language, and mild impairments in attention, executive function and visuospatial skills. Pt demonstrated no deficits in word finding in conversation, score likely impacted by higher level attention and memory deficits. Pt reports no cognitive changes and deficits could be near baseline, however family was not present to confirm. Pt does support memory baseline deficits. SLP noted functional deficits as mild, due to likely routine lifestyle and evaluating OT/PT reporting no clear cognitive impairments. SLP will follow for 1-3x a week to assess function with baseline tasks (supervision A for medication/money/time management), pt was driving and to better determine baseline skills after communication with pt's wife. Pt reported no changes in swallow function (tolerating regular textures and thin liquids) and no changes in speech. Pt would benefit from skilled ST services in order to maximize functional independence and reduce burden of care, likely requiring supervision at discharge with continued skilled ST services.   Skilled Therapeutic Interventions          Skilled ST services focused on cognitive skills. SLP  facilitated administration of cognitive linguistic formal assessment and provided education of results. SLP and pt collaborated to set goals  for cognitive linguistic needs during length of stay. All questions answered to satisfaction.  Pt was left in room with call bell within reach and bed alarm set. SLP recommends to continue skilled services.   SLP Assessment  Patient will need skilled Speech Lanaguage Pathology Services during CIR admission    Recommendations  SLP Diet Recommendations: Thin;Age appropriate regular solids Medication Administration: Whole meds with liquid Supervision: Patient able to self feed Compensations: Slow rate;Small sips/bites Postural Changes and/or Swallow Maneuvers: Seated upright 90 degrees Oral Care Recommendations: Oral care BID Patient destination: Home Follow up Recommendations:  (TBD) Equipment Recommended: None recommended by SLP    SLP Frequency 3 to 5 out of 7 days   SLP Duration  SLP Intensity  SLP Treatment/Interventions 5-7 days  Minumum of 1-2 x/day, 30 to 90 minutes  Cognitive remediation/compensation;Cueing hierarchy;Functional tasks;Internal/external aids;Medication managment;Patient/family education    Pain Pain Assessment Pain Score: 0-No pain  Prior Functioning Cognitive/Linguistic Baseline: Information not available Baseline deficit details: pt supports baseline memory deficits Type of Home: Mobile home  Lives With: Spouse Available Help at Discharge: Family Education: 12th Vocation: Retired  SLP Evaluation Cognition Overall Cognitive Status: Impaired/Different from baseline Arousal/Alertness: Awake/alert Orientation Level: Oriented X4 Attention: Sustained;Selective;Alternating Sustained Attention: Appears intact Selective Attention: Appears intact Alternating Attention: Impaired Alternating Attention Impairment: Functional complex;Verbal complex Memory: Impaired Memory Impairment: Storage deficit;Retrieval deficit Awareness: Impaired Awareness Impairment: Emergent impairment Problem Solving: Impaired Problem Solving Impairment: Functional  complex;Verbal complex Safety/Judgment: Appears intact  Comprehension Auditory Comprehension Overall Auditory Comprehension: Appears within functional limits for tasks assessed Expression Expression Primary Mode of Expression: Verbal Verbal Expression Overall Verbal Expression: Appears within functional limits for tasks assessed Oral Motor Oral Motor/Sensory Function Overall Oral Motor/Sensory Function: Within functional limits Motor Speech Overall Motor Speech: Appears within functional limits for tasks assessed  Care Tool Care Tool Cognition Expression of Ideas and Wants Expression of Ideas and Wants: Without difficulty (complex and basic) - expresses complex messages without difficulty and with speech that is clear and easy to understand   Understanding Verbal and Non-Verbal Content Understanding Verbal and Non-Verbal Content: Usually understands - understands most conversations, but misses some part/intent of message. Requires cues at times to understand   Memory/Recall Ability *first 3 days only       Short Term Goals: Week 1: SLP Short Term Goal 1 (Week 1): STG=LTG due to short ELOS  Refer to Care Plan for Long Term Goals  Recommendations for other services: None   Discharge Criteria: Patient will be discharged from SLP if patient refuses treatment 3 consecutive times without medical reason, if treatment goals not met, if there is a change in medical status, if patient makes no progress towards goals or if patient is discharged from hospital.  The above assessment, treatment plan, treatment alternatives and goals were discussed and mutually agreed upon: by patient  Jhaden Pizzuto  [facility] [place] 06/26/2020, 5:08 PM

## 2020-06-26 NOTE — Progress Notes (Signed)
Inpatient Rehabilitation Center Individual Statement of Services  Patient Name:  [name]  Date:  06/26/2020  Welcome to the [facility].  Our goal is to provide you with an individualized program based on your diagnosis and situation, designed to meet your specific needs.  With this comprehensive rehabilitation program, you will be expected to participate in at least 3 hours of rehabilitation therapies Monday-Friday, with modified therapy programming on the weekends.  Your rehabilitation program will include the following services:  Physical Therapy (PT), Occupational Therapy (OT), Speech Therapy (ST), 24 hour per day rehabilitation nursing, Care Coordinator, Rehabilitation Medicine, Nutrition Services and Pharmacy Services  Weekly team conferences will be held on Wednesday to discuss your progress.  Your Inpatient Rehabilitation Care Coordinator will talk with you frequently to get your input and to update you on team discussions.  Team conferences with you and your family in attendance may also be held.  Expected length of stay: 5-7 days  Overall anticipated outcome: independent with device  Depending on your progress and recovery, your program may change. Your Inpatient Rehabilitation Care Coordinator will coordinate services and will keep you informed of any changes. Your Inpatient Rehabilitation Care Coordinator's name and contact numbers are listed  below.  The following services may also be recommended but are not provided by the [facility] will be made to provide these services after discharge if needed.  Arrangements include referral to agencies that provide these services.  Your insurance has been verified to be:  [place] primary doctor is:  Lynn Ito [place]  Pertinent information will be shared with your doctor  and your insurance company.  Inpatient Rehabilitation Care Coordinator:  Ovidio Kin, [place] or Emilia Beck  Information discussed with and copy given to patient by: Elease Hashimoto, 06/26/2020, 1:06 PM

## 2020-06-26 NOTE — Progress Notes (Signed)
Inpatient Rehabilitation Medication Review by a Pharmacist  A complete drug regimen review was completed for this patient to identify any potential clinically significant medication issues.  Clinically significant medication issues were identified:  no  Check AMION for pharmacist assigned to patient if future medication questions/issues arise during this admission.  Pharmacist comments: N/A  Time spent performing this drug regimen review (minutes):  15 min   Coron Rossano D. Mina Marble, PharmD, BCPS, [place] 06/26/2020, 7:50 AM

## 2020-06-26 NOTE — Progress Notes (Signed)
Inpatient Rehabilitation  Patient information reviewed and entered into eRehab system by Wilferd Ritson M. Ariona Deschene, M.A., CCC/SLP, PPS Coordinator.  Information including medical coding, functional ability and quality indicators will be reviewed and updated through discharge.    

## 2020-06-26 NOTE — Progress Notes (Signed)
Danise Edge  Rehab Admission Coordinator  Physical Medicine and Rehabilitation  PMR Pre-admission      Signed  Date of Service:  06/18/2020 12:41 PM      Related encounter: ED to Hosp-Admission (Discharged) from 06/04/2020 in [facility]E [facility] [place]       Signed         PMR Admission Coordinator Pre-Admission Assessment   Patient: [name] is an 64 y.o., male MRN: [id number] DOB: [birth year] Height: '5\' 7"'  (170.2 cm) Weight: 82.5 kg (scale B)                                                                                                                                                  Insurance Information HMO: yes    PPO:      PCP:      IPA:      80/20:      OTHER:  PRIMARY: [employer]      Policy#: [id number]      Subscriber: patient CM Name: Shirlean Mylar      Phone#: [phone]     Fax#: [phone] Pre-Cert#: [id number]      Employer:  Josem Kaufmann provided by Shirlean Mylar for admit to CIR with admit date listed as 06/21/20. Auth is good for 5 days. Weekly updates are to be send to Jeanette Caprice (p): [phone] (f): [phone] Benefits:  Phone #: online     Name: [email or web address] Eff. Date: 08/16/2019 -07/14/2020     Deduct: does not have for in-network providers      Out of Pocket Max: $3,900 ($1,191.29 met)       Life Max:   CIR: $295/day co-pay for days 1-6, $0/day co-pay for days 7-90      SNF: $0/day co-pay for days 1-20, $184/day co-pay for days 21-100; limited to 100 days/cal yr Outpatient: $10-$40 per visit     Home Health: 100% coverage, 0% co-insurance; limited by medical necessity      DME: 80% coverage, 20% co-insurnace     Providers:   SECONDARY:       Policy#:       Phone#:    Development worker, community:       Phone#:    The Therapist, art Information Summary" for patients in Inpatient Rehabilitation Facilities with attached "Privacy Act [place] Records" was provided and verbally reviewed with: Patient   Emergency  Contact Information         Contact Information     Name Relation Home Work Mobile    [place] Spouse [phone]           Current Medical History  Patient Admitting Diagnosis: acute embolic left sided infarcts   History of Present Illness: Pt is a 64 y.o. male with history of CAD s/p CABG with recent NSTEMI 10/20 with revascularization and question of LV  thrombus and EF 25% therefore LifeVest placed. He was admitted on 06/04/20 with VT arrest in setting of severe ICM and treated with required  Amiodarone, lidocaine, atropine as well as external pacing and was intubated for air way protection. He underwent cardiac cath showing normal anatomy. 2D echo showed EF 25-30% with linear filling defect and was started on IV heparin due to concerns of thrombus. He had difficulty with vent wean and developed fevers with leucocytosis and purulent respiratory secretions. . Cefipime/Vanc added due to concern so of PNA.  TEE done 11/24 showing EF 30-35% and no thrombus, PFO or ASD noted. He was maintained on IV amiodarone with plans for ICD. He tolerated extubation and NGT placed for nutritional support.  placement post extubation.  On 11/30, he was found to have RUE weakness as well as speech difficulty with confusion therefore code stroke initiated.      CTA head neck was negative for LVO or ischemia but showed lobar and segmental PE in upper lungs. CTA lungs done for follow up and showed bilateral multifocal PE --majority acute in addition to subacute and chronic thrombus in LLL PA.  MRI brain done revealing small acute infarcts in left frontal precentral gyrus and juxtacortical left frontal white matter with mild edema. Dr. Rory Percy recommended addition of anticoagulation for treatment of embolic stroke as well as bilateral PE's. He had 60 minutes of slavo's of NSVT on 12/01 with plans for ICD placement but has had intermittent issues with confusion. Per chart, patient has been having intermittent confusion with  memory deficits for the past year. Cardiology would like to defer ICD placement till next week depending on WE events.Therapy ongoing and patient noted to have unsteady gait with fatigue and SOB with activity. CIR recommended due to functional decline. Pt is to admit to CIR on 06/25/2020.    Complete NIHSS TOTAL: 0 Glasgow Coma Scale Score: 15   Past Medical History      Past Medical History:  Diagnosis Date  . Borderline diabetic    . Coronary artery disease      a. s/p CABG in 2014 with LIMA-LAD, SVG-OM2 and SVG-RC b. 03/2019: STEMI and s/p angioplasty to proximal SVG-RCA and DES to SVG-RCA anastomosis c. 04/2020: recurrent STEMI with angioplasty of SVG-RCA  . Dyslipidemia 01/11/2013  . Hiatal hernia    . Hypertension    . Mini stroke ([place])      2  . S/P CABG x 3    . STEMI (ST elevation myocardial infarction) ([place])    . Stroke [facility])        Family History  family history includes Cancer in his father; Heart attack in his brother, father, and sister; Hyperlipidemia in his brother and father; Hypertension in his brother, father, and sister.   Prior Rehab/Hospitalizations:  Has the patient had prior rehab or hospitalizations prior to admission? Yes   Has the patient had major surgery during 100 days prior to admission? No   Current Medications    Current Facility-Administered Medications:  .   stroke: mapping our early stages of recovery book, , Does not apply, Once, Lars Mage T, MD .  (feeding supplement) PROSource Plus liquid 30 mL, 30 mL, Oral, BID BM, Alma Friendly, MD, 30 mL at 06/25/20 1003 .  acetaminophen (TYLENOL) tablet 650 mg, 650 mg, Oral, Q4H PRN, Donnamae Jude, RPH .  albuterol (PROVENTIL) (2.5 MG/3ML) 0.083% nebulizer solution 2.5 mg, 2.5 mg, Nebulization, Q4H PRN, Vickie Epley, MD, 2.5 mg at  06/09/20 2027 .  amiodarone (PACERONE) tablet 200 mg, 200 mg, Oral, BID, Donnamae Jude, RPH, 200 mg at 06/25/20 1004 .  apixaban (ELIQUIS) tablet 5 mg, 5  mg, Oral, BID, Vickie Epley, MD, 5 mg at 06/25/20 1003 .  aspirin EC tablet 81 mg, 81 mg, Oral, Daily, Donnamae Jude, RPH, 81 mg at 06/25/20 1003 .  atorvastatin (LIPITOR) tablet 80 mg, 80 mg, Oral, q1800, Donnamae Jude, RPH, 80 mg at 06/24/20 1816 .  chlorhexidine (PERIDEX) 0.12 % solution 15 mL, 15 mL, Mouth Rinse, BID, Vickie Epley, MD, 15 mL at 06/25/20 1004 .  Chlorhexidine Gluconate Cloth 2 % PADS 6 each, 6 each, Topical, Daily, Vickie Epley, MD, 6 each at 06/24/20 1007 .  clopidogrel (PLAVIX) tablet 75 mg, 75 mg, Oral, Daily, Shirley Friar, PA-C, 75 mg at 06/25/20 1003 .  digoxin (LANOXIN) tablet 0.125 mg, 0.125 mg, Oral, Daily, Donnamae Jude, RPH, 0.125 mg at 06/25/20 1006 .  escitalopram (LEXAPRO) tablet 10 mg, 10 mg, Oral, Daily, Donnamae Jude, RPH, 10 mg at 06/25/20 1003 .  ezetimibe (ZETIA) tablet 10 mg, 10 mg, Oral, Daily, Donnamae Jude, RPH, 10 mg at 06/25/20 1003 .  feeding supplement (ENSURE ENLIVE / ENSURE PLUS) liquid 237 mL, 237 mL, Oral, QID, Alma Friendly, MD, 237 mL at 06/23/20 1752 .  guaiFENesin (ROBITUSSIN) 100 MG/5ML solution 100 mg, 5 mL, Oral, Q4H PRN, Donnamae Jude, RPH .  hydrALAZINE (APRESOLINE) tablet 10 mg, 10 mg, Oral, Q8H, Donnamae Jude, RPH, 10 mg at 06/25/20 0500 .  loperamide (IMODIUM) capsule 2 mg, 2 mg, Oral, PRN, Alma Friendly, MD .  MEDLINE mouth rinse, 15 mL, Mouth Rinse, q12n4p, Vickie Epley, MD, 15 mL at 06/23/20 1625 .  metoprolol succinate (TOPROL-XL) 24 hr tablet 25 mg, 25 mg, Oral, Daily, Shirley Friar, PA-C, 25 mg at 06/24/20 1006 .  nitroGLYCERIN (NITROSTAT) SL tablet 0.4 mg, 0.4 mg, Sublingual, Q5 Min x 3 PRN, Vickie Epley, MD .  ondansetron [facility]) injection 4 mg, 4 mg, Intravenous, Q6H PRN, Vickie Epley, MD, 4 mg at 06/11/20 0133 .  sodium chloride flush (NS) 0.9 % injection 3 mL, 3 mL, Intravenous, Q12H, Vickie Epley, MD, 3 mL at 06/25/20 1005 .  spironolactone  (ALDACTONE) tablet 25 mg, 25 mg, Oral, Daily, Donnamae Jude, RPH, 25 mg at 06/24/20 1005   Patients Current Diet:     Diet Order                      Diet regular Room service appropriate? Yes with Assist; Fluid consistency: Thin  Diet effective now                      Precautions / Restrictions Precautions Precautions: Fall,ICD/Pacemaker Precaution Comments: [employer] Restrictions Weight Bearing Restrictions: Yes LUE Weight Bearing: Non weight bearing (pacemaker) RLE Weight Bearing: Non weight bearing    Has the patient had 2 or more falls or a fall with injury in the past year?No   Prior Activity Level Community (5-7x/wk): drives some. gets out of house almost everyday   Prior Functional Level Prior Function Level of Independence: Independent Comments: Works partime   Self Care: Did the patient need help bathing, dressing, using the toilet or eating?  Independent   Indoor Mobility: Did the patient need assistance with walking from room to room (with or without device)? Independent   Stairs:  Did the patient need assistance with internal or external stairs (with or without device)? Independent   Functional Cognition: Did the patient need help planning regular tasks such as shopping or remembering to take medications? Needed some help   Home Assistive Devices / [place] Devices/Equipment: None Home Equipment: None   Prior Device Use: Indicate devices/aids used by the patient prior to current illness, exacerbation or injury? None of the above   Current Functional Level Cognition   Arousal/Alertness: Awake/alert Overall Cognitive Status: Impaired/Different from baseline Orientation Level: Oriented X4 Following Commands: Follows multi-step commands with increased time Safety/Judgement: Decreased awareness of safety,Decreased awareness of deficits General Comments: continues to need increased cuing for sequencing  Attention: Focused,Sustained Focused  Attention: Appears intact Sustained Attention: Impaired Memory: Impaired Memory Impairment: Storage deficit,Retrieval deficit Awareness: Impaired Awareness Impairment: Emergent impairment    Extremity Assessment (includes Sensation/Coordination)   Upper Extremity Assessment: RUE deficits/detail RUE Deficits / Details: Pt presenting with decreased coorindation both fine and gross motor.  Pt unable to perform finger opposition. Poor and limited digit extension. Weak grasp strength. Limited gross motor coorindation to bring arms above head. unable to isolate 2nd digit to perform finger to nose test. Unable to perform rapid alternating movements.  RUE Sensation:  ("It just feels different.") RUE Coordination: decreased fine motor,decreased gross motor  Lower Extremity Assessment: Defer to PT evaluation,RLE deficits/detail,LLE deficits/detail RLE Deficits / Details: During toe tapping, R foot with decreased coorindation and difficulty performing alteranting movement and synconized movement. generalized weakness, pt reports being ticklish however has jerky motion when feet and LE are touched. During functional transfer, pt with poor coorindation and tendency to slide R foot. RLE Coordination: decreased fine motor LLE Deficits / Details: generalized weakness, pt reports being ticklish however has jerky motion when feet and LE are touched      ADLs   Overall ADL's : Needs assistance/impaired Eating/Feeding: Independent,Sitting Eating/Feeding Details (indicate cue type and reason): Patient able to eat lunch this date: regular diet with thin liquids. Grooming: Min guard,Standing Grooming Details (indicate cue type and reason): simulated with in room ambulation to/from b.room. declines need for acutal use but amb. in b.room and to recliner Upper Body Bathing: Supervision/ safety,Sitting Lower Body Bathing: Minimal assistance,Sit to/from stand Upper Body Dressing : Minimal assistance,Sitting Lower  Body Dressing: Min guard,Sitting/lateral leans Lower Body Dressing Details (indicate cue type and reason): able to reach b les to adjust socks Toilet Transfer: Min Animal nutritionist Details (indicate cue type and reason): simulated via functionalmobilityu with RW, min guard fro safety Functional mobility during ADLs: Minimal assistance General ADL Comments: increased SOB noted however SpO2 96% post ambulation     Mobility   Overal bed mobility: Needs Assistance Bed Mobility: Sidelying to Sit,Rolling Rolling: Min guard Sidelying to sit: HOB elevated,Min assist Supine to sit: HOB elevated,Min assist Sit to supine: Supervision,HOB elevated General bed mobility comments: cues to maintain precautions, physical assistance to bring trunk upright     Transfers   Overall transfer level: Needs assistance Equipment used: Rolling walker (2 wheeled) Transfers: Sit to/from [employer] Sit to Stand: Min guard Stand pivot transfers: Min guard General transfer comment: Cues for hand placement and not to push with L UE into standing.     Ambulation / Gait / Stairs / Wheelchair Mobility   Ambulation/Gait Ambulation/Gait assistance: Min assist,Min guard Gait Distance (Feet): 450 Feet Assistive device: Rolling walker (2 wheeled) Gait Pattern/deviations: Decreased step length - left,Decreased step length - right,Trunk flexed,Step-through pattern,Shuffle General  Gait Details: min A for steadying with RW, vc for proximity to RW and upright posture Gait velocity: slowed Gait velocity interpretation: <1.8 ft/sec, indicate of risk for recurrent falls Stairs: Yes Stairs assistance: Min assist Stair Management: One rail Right Number of Stairs: 5 General stair comments: Mild unsteadiness min assistance to maintain balance.     Posture / Balance Balance Overall balance assessment: Needs assistance Sitting-balance support: Feet supported,No upper extremity  supported Sitting balance-Leahy Scale: Fair Standing balance support: During functional activity,Single extremity supported Standing balance-Leahy Scale: Poor Standing balance comment: reliant on external support     Special needs/care consideration Continuous Drip IV  heparin ADULT infusion 100 units/mL; 0.9% sodium chloride infusion, Life Vest prescribed after 05/04/20 hospitalization. Pt was non-compliant with wearing, Skin Right deep tissure pressure injury: buttocks and Designated visitor Olyver Hawes, wife        Previous Environmental health practitioner (from acute therapy documentation) Living Arrangements: Spouse/significant other Available Help at Discharge: Family Type of Home: Mobile home (modular home) Home Layout: One level Home Access: Stairs to enter Entrance Stairs-Rails: Surveyor, mining of Steps: 4 Bathroom Shower/Tub: Optometrist: Yes How Accessible: Accessible via walker [place]: No   Discharge Living Setting Plans for Discharge Living Setting: Patient's home Type of Home at Discharge: Mobile home (modular home) Discharge Home Layout: One level Discharge Home Access: Stairs to enter Entrance Stairs-Rails: Right,Left Entrance Stairs-Number of Steps: 4 Discharge Bathroom Shower/Tub: Tub/shower unit Discharge Bathroom Toilet: Standard Discharge Bathroom Accessibility: Yes How Accessible: Accessible via walker Does the patient have any problems obtaining your medications?: No   Social/Family/Support Systems Patient Roles: Spouse,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 Information: wife: [name] [phone] Anticipated Caregiver: wife Anticipated Caregiver's Contact Information: see above Ability/Limitations of Caregiver: Supervision/Min A Caregiver Availability: 24/7 Discharge Plan Discussed with Primary Caregiver: Yes Is Caregiver In Agreement with Plan?: Yes Does Caregiver/Family have Issues with  Lodging/Transportation while Pt is in Rehab?: No     Goals Patient/Family Goal for Rehab: Mod I: PT/OT/ST Expected length of stay: 7-10 days Pt/Family Agrees to Admission and willing to participate: Yes Program Orientation Provided & Reviewed with Pt/Caregiver Including Roles  & Responsibilities: Yes  Barriers to Discharge: Home environment access/layout  Barriers to Discharge Comments: steps to enter home.      Decrease burden of Care through IP rehab admission: NA     Possible need for SNF placement upon discharge: NA     Patient Condition: This patient's medical and functional status has changed since the consult dated: 06/16/20 in which the Rehabilitation Physician determined and documented that the patient's condition is appropriate for intensive rehabilitative care in an inpatient rehabilitation facility. See "History of Present Illness" (above) for medical update. Functional changes are: Pt. Now ambulating min A/Min G. Patient's medical and functional status update has been discussed with the Rehabilitation physician and patient remains appropriate for inpatient rehabilitation. Will admit to inpatient rehab today.    Preadmission Screen Completed By: Raechel Ache, OT, with updates by Genella Mech, CCC-SLP, 06/25/2020 11:31 AM ______________________________________________________________________   Discussed status with Dr. Ranell Patrick on 07/04/2020 at 56 and received approval for admission today.   Admission Coordinator:  Genella Mech, time 11:33 /Date12/06/2020              Cosigned by: Izora Ribas, MD at 06/25/2020 12:08 PM    Revision History  Note Details  Author Danise Edge File Time 06/25/2020 11:34 AM  Author Type Rehab Admission Coordinator Status Signed  Last Editor Danise Edge Service Physical Medicine and [place] # [phone] [place] Date 06/25/2020

## 2020-06-26 NOTE — Progress Notes (Signed)
Patient information reviewed and entered into eRehab System by Becky Remell Giaimo, PPS coordinator. Information including medical coding, function ability, and quality indicators will be reviewed and updated through discharge.   

## 2020-06-26 NOTE — Evaluation (Signed)
Occupational Therapy Assessment and Plan  Patient Details  Name: [name] MRN: [id number] Date of Birth: March 23, 1956  OT Diagnosis: abnormal posture, muscle weakness (generalized) and coordination disorder Rehab Potential: Rehab Potential (ACUTE ONLY): Excellent ELOS: 5-7 days   Today's Date: 06/26/2020 OT Individual Time: 0900-1000 OT Individual Time Calculation (min): 60 min     Hospital Problem: Principal Problem:   Cerebral infarction [facility]) Active Problems:   Status post CVA   Debility   Past Medical History:  Past Medical History:  Diagnosis Date  . Borderline diabetic   . Coronary artery disease    a. s/p CABG in 2014 with LIMA-LAD, SVG-OM2 and SVG-RC b. 03/2019: STEMI and s/p angioplasty to proximal SVG-RCA and DES to SVG-RCA anastomosis c. 04/2020: recurrent STEMI with angioplasty of SVG-RCA  . Dyslipidemia 01/11/2013  . Hiatal hernia   . Hypertension   . Mini stroke ([place])    2  . S/P CABG x 3   . STEMI (ST elevation myocardial infarction) ([place])   . Stroke [facility])    Past Surgical History:  Past Surgical History:  Procedure Laterality Date  . CARDIAC CATHETERIZATION    . CORONARY ARTERY BYPASS GRAFT N/A 09/01/2012   Procedure: CORONARY ARTERY BYPASS GRAFTING (CABG);  Surgeon: Gaye Pollack, MD;  Location: [place];  Service: Open Heart Surgery;  Laterality: N/A;  Coronary Artery Bypass Grafting Times Three Using Left Internal Mammary Artery and Left Saphenous leg Vein Harvested Endoscopically  . CORONARY STENT INTERVENTION N/A 03/23/2019   Procedure: CORONARY STENT INTERVENTION;  Surgeon: Lorretta Harp, MD;  Location: [place] CV LAB;  Service: Cardiovascular;  Laterality: N/A;  . CORONARY/GRAFT ACUTE MI REVASCULARIZATION N/A 03/23/2019   Procedure: Coronary/Graft Acute MI Revascularization;  Surgeon: Lorretta Harp, MD;  Location: [place] CV LAB;  Service: Cardiovascular;  Laterality: N/A;  . CORONARY/GRAFT ACUTE MI REVASCULARIZATION N/A 05/03/2020    Procedure: Coronary/Graft Acute MI Revascularization;  Surgeon: Belva Crome, MD;  Location: [place] CV LAB;  Service: Cardiovascular;  Laterality: N/A;  . IABP INSERTION N/A 06/04/2020   Procedure: IABP Insertion;  Surgeon: Martinique, Peter M, MD;  Location: [place] CV LAB;  Service: Cardiovascular;  Laterality: N/A;  . ICD IMPLANT N/A 06/19/2020   Procedure: ICD IMPLANT;  Surgeon: Vickie Epley, MD;  Location: [place] CV LAB;  Service: Cardiovascular;  Laterality: N/A;  . INTRAOPERATIVE TRANSESOPHAGEAL ECHOCARDIOGRAM N/A 09/01/2012   Procedure: INTRAOPERATIVE TRANSESOPHAGEAL ECHOCARDIOGRAM;  Surgeon: Gaye Pollack, MD;  Location: [place] OR;  Service: Open Heart Surgery;  Laterality: N/A;  . LEFT HEART CATH AND CORONARY ANGIOGRAPHY N/A 03/23/2019   Procedure: LEFT HEART CATH AND CORONARY ANGIOGRAPHY;  Surgeon: Lorretta Harp, MD;  Location: [place] CV LAB;  Service: Cardiovascular;  Laterality: N/A;  . LEFT HEART CATH AND CORONARY ANGIOGRAPHY N/A 05/03/2020   Procedure: LEFT HEART CATH AND CORONARY ANGIOGRAPHY;  Surgeon: Belva Crome, MD;  Location: [place] CV LAB;  Service: Cardiovascular;  Laterality: N/A;  . LEFT HEART CATHETERIZATION WITH CORONARY ANGIOGRAM N/A 08/31/2012   Procedure: LEFT HEART CATHETERIZATION WITH CORONARY ANGIOGRAM;  Surgeon: Peter M Martinique, MD;  Location[facility];  Service: Cardiovascular;  Laterality: N/A;  . PERCUTANEOUS CORONARY STENT INTERVENTION (PCI-S) N/A 08/31/2012   Procedure: PERCUTANEOUS CORONARY STENT INTERVENTION (PCI-S);  Surgeon: Peter M Martinique, MD;  Location: [facility] CATH LAB;  Service: Cardiovascular;  Laterality: N/A;  . RIGHT/LEFT HEART CATH AND CORONARY/GRAFT ANGIOGRAPHY N/A 06/04/2020   Procedure: RIGHT/LEFT HEART CATH AND CORONARY/GRAFT ANGIOGRAPHY;  Surgeon: Martinique, Peter M, MD;  Location: [place] CV LAB;  Service: Cardiovascular;  Laterality: N/A;  . TEMPORARY PACEMAKER N/A 06/04/2020   Procedure: TEMPORARY PACEMAKER;   Surgeon: Martinique, Peter M, MD;  Location: [place] CV LAB;  Service: Cardiovascular;  Laterality: N/A;    Assessment & Plan Clinical Impression:  [name] is a 64 year old right-handed male with history of CAD status post CABG recent non-STEMI 10/20 with revascularization and question of LV thrombus and ejection fraction 25% therefore LifeVest was placed. Per chart review patient lives with spouse. Mobile home with 4 steps to entry. Reportedly independent prior to admission. Presented 06/04/2020 with VT arrest in setting of severe ICM and treated with amiodarone, lidocaine, atropine as well as external pacing and was intubated for airway protection. He underwent cardiac catheterization showing normal anatomy. Echocardiogram ejection fraction 25 to 30% with linear filling defect was started on IV heparin due to concerns of thrombus. He had difficulty with vent wean developed fevers leukocytosis purulent respiratory secretions and placed on broad-spectrum antibiotics. TEE 06/07/2020 showed ejection fraction 30 to 35% no thrombus, PFO or ASD noted. Cardiology follow-up question plans for ICD placement. He tolerated extubation nasogastric tube in place for nutritional support. On 06/13/2020 he was found to have a right upper extremity weakness as well as speech difficulty with confusion code stroke initiated. CTA head neck was negative for LVO or ischemia but showed lobar and segmental pulmonary emboli in upper lungs. CTA lungs done for follow-up showed bilateral multifocal PE majority acute in addition to subacute and chronic thrombus in LLL PA. MRI of the brain showed small acute infarct in left frontal precentral gyrus and juxtacortical left frontal white matter with mild edema. Patient was placed on Eliquis. Cardiology follow-up and patient underwent [place] ICD dual-chamber pacemaker placement 06/19/2020. He is tolerating a regular diet however receiving supplemental nutritional support through  nasogastric tube 14 hours a day. Admitting to CIR today with impaired mobility and ADLs. Was able to ambulate 500 feet with supervision today, HR well controlled, wife has been notified regarding admission.   Patient currently requires min with basic self-care skills secondary to muscle weakness, decreased cardiorespiratoy endurance and decreased standing balance and decreased postural control.  Prior to hospitalization, patient could complete BADLs with independent .  Patient will benefit from skilled intervention to increase independence with basic self-care skills prior to discharge home with wife.  Anticipate patient will require intermittent supervision and follow up home health.  OT - End of Session Endurance Deficit: Yes Endurance Deficit Description: Only a few seated rest breaks b/w functional mobility tasks OT Assessment Rehab Potential (ACUTE ONLY): Excellent OT Barriers to Discharge: Lack of/limited family support;Weight bearing restrictions OT Patient demonstrates impairments in the following area(s): Balance;Safety;Endurance;Motor OT Basic ADL's Functional Problem(s): Grooming;Bathing;Dressing;Toileting OT Advanced ADL's Functional Problem(s): Light Housekeeping OT Transfers Functional Problem(s): Tub/Shower;Toilet OT Additional Impairment(s): None OT Plan OT Intensity: Minimum of 1-2 x/day, 45 to 90 minutes OT Frequency: 5 out of 7 days OT Duration/Estimated Length of Stay: 5-7 days OT Treatment/Interventions: Balance/vestibular training;Discharge planning;Pain management;Self Care/advanced ADL retraining;Therapeutic Activities;UE/LE Coordination activities;Disease mangement/prevention;Functional mobility training;Patient/family education;Therapeutic Exercise;Wheelchair propulsion/positioning;UE/LE Strength taining/ROM;Psychosocial support;Neuromuscular re-education;DME/adaptive equipment instruction;Community reintegration OT Self Feeding Anticipated Outcome(s): No goal OT  Basic Self-Care Anticipated Outcome(s): Supervision/setup-Mod I OT Toileting Anticipated Outcome(s): Mod I OT Bathroom Transfers Anticipated Outcome(s): Supervision/setup-Mod I OT Recommendation Patient destination: Home Follow Up Recommendations: Home health OT Equipment Recommended: To be determined   OT Evaluation Precautions/Restrictions  Precautions Precautions: Fall;ICD/Pacemaker Restrictions Weight  Bearing Restrictions: No LUE Weight Bearing: Weight bearing as tolerated Other Position/Activity Restrictions: 5-10lb weight limit in Left arm, okay to lift arm overhead. Avoid extreme external rotation/abduction Pain Pain Assessment Pain Scale: 0-10 Pain Score: 0-No pain Home Living/Prior Functioning Home Living Available Help at Discharge: Family Type of Home: Mobile home Home Access: Stairs to enter Entrance Stairs-Number of Steps: 4 Entrance Stairs-Rails: Right,Left Home Layout: One level Bathroom Shower/Tub: Chiropodist: Standard Bathroom Accessibility: Yes  Lives With: Spouse [name]) IADL History Homemaking Responsibilities: Yes Meal Prep Responsibility: No Laundry Responsibility: No Cleaning Responsibility: No Bill Paying/Finance Responsibility: Primary Homemaking Comments: Pt also mows the lawn, all other IADL responsibilities are delegated to spouse Occupation: Retired Type of Occupation: Used to work for [employer] and [place]: Tinkering with old cars, mowing the lawn Prior Function Level of Independence: Independent with basic ADLs,Independent with transfers  Able to [place]?: Yes Driving: Yes Vocation: Retired Biomedical scientist: Dealer Leisure: Hobbies-yes (Comment) Comments: Works on cars at home Vision Baseline Vision/History: Wears glasses Wears Glasses: Reading only Patient Visual Report: No change from baseline Vision Assessment?: No apparent visual deficits Perception  Perception: Within Functional  Limits Praxis Praxis: Intact Cognition Overall Cognitive Status: Within Functional Limits for tasks assessed Arousal/Alertness: Awake/alert Orientation Level: Person;Place;Situation Person: Oriented Place: Oriented Situation: Oriented Year: 2021 Month: December Day of Week: Correct Immediate Memory Recall: Sock;Blue;Bed Memory Recall Sock: Without Cue Memory Recall Blue: Without Cue Memory Recall Bed: Without Cue Safety/Judgment: Appears intact Sensation Sensation Light Touch: Appears Intact Hot/Cold: Appears Intact Proprioception: Appears Intact Stereognosis: Appears Intact Coordination Gross Motor Movements are Fluid and Coordinated: Yes Fine Motor Movements are Fluid and Coordinated: No Coordination and Movement Description: Mild tremulous activity Lt>Rt UE Finger Nose Finger Test: Tremulous Lt>Rt UE, per pt this is baseline Heel Shin Test: [facility] Motor  Motor Motor: Within Functional Limits Motor - Skilled Clinical Observations: Mild delayed initiation  Trunk/Postural Assessment  Cervical Assessment Cervical Assessment: Exceptions to [facility] (forward head) Thoracic Assessment Thoracic Assessment: Exceptions to [facility] (rounded shoulders) Lumbar Assessment Lumbar Assessment: Exceptions to [facility] (posterior pelvic tilt) Postural Control Postural Control: Deficits on evaluation (limited in standing)  Balance Balance Balance Assessed: Yes (CGA dynamic standing balance during LB dressing, supervision dynamic sitting when washing feet EOB) Standardized Balance Assessment Standardized Balance Assessment: Berg Balance Test Berg Balance Test Sit to Stand: Able to stand without using hands and stabilize independently Standing Unsupported: Able to stand 2 minutes with supervision Sitting with Back Unsupported but Feet Supported on Floor or Stool: Able to sit safely and securely 2 minutes Stand to Sit: Sits safely with minimal use of hands Transfers: Able to transfer safely, definite  need of hands Standing Unsupported with Eyes Closed: Able to stand 10 seconds with supervision Standing Ubsupported with Feet Together: Able to place feet together independently and stand for 1 minute with supervision From Standing, Reach Forward with Outstretched Arm: Can reach forward >12 cm safely (5") From Standing Position, Pick up Object from Floor: Able to pick up shoe, needs supervision From Standing Position, Turn to Look Behind Over each Shoulder: Looks behind from both sides and weight shifts well Turn 360 Degrees: Able to turn 360 degrees safely one side only in 4 seconds or less Standing Unsupported, Alternately Place Feet on Step/Stool: Able to stand independently and safely and complete 8 steps in 20 seconds Standing Unsupported, One Foot in Front: Able to plae foot ahead of the other independently and hold 30 seconds Standing on One Leg:  Able to lift leg independently and hold 5-10 seconds Total Score: 47 Extremity/Trunk Assessment RUE Assessment RUE Assessment: Within Functional Limits Active Range of Motion (AROM) Comments: WNL General Strength Comments: 4+/5 grossly LUE Assessment LUE Assessment: Within Functional Limits Active Range of Motion (AROM) Comments: WNL General Strength Comments: Deferred MMT due to pacemaker precautions  Care Tool Care Tool Self Care Eating        Oral Care         Bathing              Upper Body Dressing(including orthotics)            Lower Body Dressing (excluding footwear)          Putting on/Taking off footwear             Care Tool Toileting Toileting activity Toileting Activity did not occur (Clothing management and hygiene only):  (pt used urinal) Assist for toileting: Independent     Care Tool Bed Mobility Roll left and right activity   Roll left and right assist level: Supervision/Verbal cueing    Sit to lying activity   Sit to lying assist level: Contact Guard/Touching assist    Lying to sitting  edge of bed activity   Lying to sitting edge of bed assist level: Contact Guard/Touching assist     Care Tool Transfers Sit to stand transfer   Sit to stand assist level: Contact Guard/Touching assist    Chair/bed transfer   Chair/bed transfer assist level: Contact Guard/Touching assist     Toilet transfer   Assist Level: Contact Guard/Touching assist     Care Tool Cognition Expression of Ideas and Wants     Understanding Verbal and Non-Verbal Content     Memory/Recall Ability *first 3 days only      Refer to Care Plan for Long Term Goals  SHORT TERM GOAL WEEK 1 OT Short Term Goal 1 (Week 1): STGs=LTGs due to ELOS  Recommendations for other services: None    Skilled Therapeutic Intervention Skilled OT session completed with focus on initial evaluation, education on OT role/POC, and establishment of patient-centered goals.   Pt greeted in bed with no c/o pain. Clarified Lt UE precautions with PA s/p pacemaker, going over notes from surgeon with pt adhering to his precautions during session given min cuing. Pt then engaged in bathing/dressing tasks sitting EOB at sit<stand level. Supervision for dynamic standing balance while using RW, CGA dynamic standing without device. Pt able to use the Rt hand at the dominant level, states he uses Rt at dominant level when eating as well at the hospital. Noted some tremulous activity Lt>Rt UE but did not appear to impact function during session. Simulated toilet transfer completed using RW with supervision assist, 1 small LOB when turning given CGA to correct. Supervision standing balance while completing oral care at the sink. At end of session pt remained sitting comfortably in the recliner, left him with all needs within reach and bed alarm set.  ADL ADL Grooming: Supervision/safety Where Assessed-Grooming: Standing at sink Upper Body Bathing: Setup Where Assessed-Upper Body Bathing: Edge of bed Lower Body Bathing:  Supervision/safety Where Assessed-Lower Body Bathing: Edge of bed Upper Body Dressing: Setup Where Assessed-Upper Body Dressing: Edge of bed Lower Body Dressing: Contact guard Where Assessed-Lower Body Dressing: Edge of bed Toileting: Not assessed Toilet Transfer: Contact guard Toilet Transfer Method: Ambulating (RW) Tub/Shower Transfer: Not assessed Mobility  Bed Mobility Bed Mobility: Rolling Right;Rolling Left;Supine to Sit;Sit to Supine Rolling  Right: Supervision/verbal cueing Rolling Left: Supervision/Verbal cueing Supine to Sit: Contact Guard/Touching assist Sit to Supine: Contact Guard/Touching assist Transfers Sit to Stand: Contact Guard/Touching assist Stand to Sit: Contact Guard/Touching assist   Discharge Criteria: Patient will be discharged from OT if patient refuses treatment 3 consecutive times without medical reason, if treatment goals not met, if there is a change in medical status, if patient makes no progress towards goals or if patient is discharged from hospital.  The above assessment, treatment plan, treatment alternatives and goals were discussed and mutually agreed upon: by patient  Skeet Simmer 06/26/2020, 12:37 PM

## 2020-06-26 NOTE — IPOC Note (Signed)
Individualized overall Plan of Care [facility]) Patient Details Name: [name] MRN: [id number] DOB: 10/14/1955  Admitting Diagnosis: Cerebral infarction [facility])  Hospital Problems: Principal Problem:   Cerebral infarction [facility]) Active Problems:   Status post CVA   Debility   AKI (acute kidney injury) ([place])   Leukocytosis   Right hemiparesis ([place])   Recurrent ventricular tachycardia ([place])   Transaminitis   Sleep disturbance     Functional Problem List: Nursing Nutrition,Endurance,Sensory  PT Balance,Endurance,Motor,Safety  OT Balance,Safety,Endurance,Motor  SLP Cognition  TR         Basic ADL's: OT Grooming,Bathing,Dressing,Toileting     Advanced  ADL's: OT Light Housekeeping     Transfers: PT Bed Mobility,Bed to Chair,Car  OT Tub/Shower,Toilet     Locomotion: PT Ambulation,Stairs     Additional Impairments: OT None  SLP Social Cognition   Memory,Problem Solving,Awareness,Attention  TR      Anticipated Outcomes Item Anticipated Outcome  Self Feeding No goal  Swallowing      Basic self-care  Supervision/setup-Mod I  Toileting  Mod I   Bathroom Transfers Supervision/setup-Mod I  Bowel/Bladder  to remain continent x 2 while in rehab  Transfers  mod I with LRAD  Locomotion  mod I with LRAD  Communication     Cognition  Min A recall, Supervision A  Pain  less than 3 out of 10  Safety/Judgment  to remain fall free while in rehab   Therapy Plan: PT Intensity: Minimum of 1-2 x/day ,45 to 90 minutes PT Frequency: 5 out of 7 days PT Duration Estimated Length of Stay: 5-7 days OT Intensity: Minimum of 1-2 x/day, 45 to 90 minutes OT Frequency: 5 out of 7 days OT Duration/Estimated Length of Stay: 5-7 days SLP Intensity: Minumum of 1-2 x/day, 30 to 90 minutes SLP Frequency: 3 to 5 out of 7 days SLP Duration/Estimated Length of Stay: 5-7 days    Team Interventions: Nursing Interventions Patient/Family Education,Disease Management/Prevention,Skin  Care/Wound Management,Discharge Planning,Psychosocial Support,Cognitive Remediation/Compensation  PT interventions Ambulation/gait training,Cognitive remediation/compensation,Balance/vestibular training,Community reintegration,Discharge planning,Disease management/prevention,Neuromuscular re-education,DME/adaptive equipment instruction,Functional mobility training,Pain management,Patient/family education,Skin care/wound management,Psychosocial support,Splinting/orthotics,Therapeutic Activities,UE/LE Strength taining/ROM,Visual/perceptual remediation/compensation,Wheelchair propulsion/positioning,UE/LE Engineer, manufacturing systems  OT Interventions Balance/vestibular training,Discharge planning,Pain management,Self Care/advanced ADL retraining,Therapeutic Activities,UE/LE Coordination activities,Disease mangement/prevention,Functional mobility training,Patient/family education,Therapeutic Exercise,Wheelchair propulsion/positioning,UE/LE Strength taining/ROM,Psychosocial support,Neuromuscular re-education,DME/adaptive equipment instruction,Community reintegration  SLP Interventions Cognitive remediation/compensation,Cueing hierarchy,Functional tasks,Internal/external aids,Medication managment,Patient/family education  TR Interventions    SW/CM Interventions Discharge Planning,Psychosocial Support,Patient/Family Education   Barriers to Discharge MD  Medical stability  Nursing      PT Home environment access/layout,Insurance for SNF coverage,Medication compliance    OT Lack of/limited family support,Weight bearing restrictions    SLP      SW       Team Discharge Planning: Destination: PT-Home ,OT- Home , SLP-Home Projected Follow-up: PT-Outpatient PT,24 hour supervision/assistance, OT-  Home health OT, SLP- (TBD) Projected Equipment Needs: PT-To be determined, OT- To be determined, SLP-None recommended by SLP Equipment Details: PT-Pt owns a cane and RW, OT-   Patient/family involved in discharge planning: PT- Patient,  OT-Patient, SLP-Patient  MD ELOS: 3-4 days. Medical Rehab Prognosis:  Excellent Assessment: 64 year old right-handed male with history of CAD status post CABG recent non-STEMI 10/20 with revascularization and question of LV thrombus and ejection fraction 25% therefore LifeVest was placed.  He presented on 06/04/2020 with VT arrest in setting of severe ICM and treated with amiodarone, lidocaine, atropine as well as external pacing and was intubated for airway protection. He underwent cardiac catheterization showing normal anatomy. Echocardiogram  ejection fraction 25 to 30% with linear filling defect was started on IV heparin due to concerns of thrombus. He had difficulty with vent wean developed fevers leukocytosis purulent respiratory secretions and placed on broad-spectrum antibiotics. TEE 06/07/2020 showed ejection fraction 30 to 35% no thrombus, PFO or ASD noted. Cardiology follow-up question plans for ICD placement. He tolerated extubation nasogastric tube in place for nutritional support. On 06/13/2020 he was found to have a right upper extremity weakness as well as speech difficulty with confusion code stroke initiated. CTA head neck was negative for LVO or ischemia but showed lobar and segmental pulmonary emboli in upper lungs. CTA lungs done for follow-up showed bilateral multifocal PE majority acute in addition to subacute and chronic thrombus in LLL PA. MRI of the brain showed small acute infarct in left frontal precentral gyrus and juxtacortical left frontal white matter with mild edema. Patient was placed on Eliquis. Cardiology follow-up and patient underwent [place] ICD dual-chamber pacemaker placement 06/19/2020. He is tolerating a regular diet.   Patient with resulting functional deficits with self-care.  We will set goals for mod I with PT/OT and Supervision with SLP.   Due to the current state of emergency, patients may not be  receiving their 3-hours of Medicare-mandated therapy.  See Team Conference Notes for weekly updates to the plan of care

## 2020-06-26 NOTE — Progress Notes (Signed)
Occupational Therapy Session Note  Patient Details  Name: Mir Fullilove MRN: [id number] Date of Birth: 02-Feb-1956  Today's Date: 06/26/2020 OT Individual Time: 1300-1330 OT Individual Time Calculation (min): 30 min    Short Term Goals: Week 1:  OT Short Term Goal 1 (Week 1): STGs=LTGs due to ELOS  Skilled Therapeutic Interventions/Progress Updates:  Pt supine in bed, no c/o pain, agreeable to OT session.  Pt participated in functional mobility and dynamic standing training and RW level to facilitate increased safety and independence during self care tasks.  Pt completed supine to sit with supervision, sit to stand at [facility] with supervision, and ambulated to dayroom with CGA.  Pt completed stand to sit with CGA needing VCs for safe RW mgt.  Pt completed dynamic standing task with RUE functional reach at RW to grasp and toss bean bags at target requiring supervision to CGA and requiring VCs for safe BLE positioning inside RW due to pt attempting to step RLE outside frame.  Pt ambulated back to room using RW and stand to sit at recliner with supervision.  Call bell in reach, seat alarm on.  Therapy Documentation Precautions:  Precautions Precautions: Fall,ICD/Pacemaker Restrictions Weight Bearing Restrictions: No LUE Weight Bearing: Weight bearing as tolerated Other Position/Activity Restrictions: 5-10lb weight limit in Left arm, okay to lift arm overhead. Avoid extreme external rotation/abduction   Therapy/Group: Individual Therapy  [name] 06/26/2020, 4:22 PM

## 2020-06-26 NOTE — Progress Notes (Addendum)
[place] PHYSICAL MEDICINE & REHABILITATION PROGRESS NOTE  Subjective/Complaints: Patient seen laying in bed this morning.  He states she slept fairly overnight.  He states she is ready begin therapies.  ROS: Denies CP, SOB, N/V/D  Objective: Vital Signs: Blood pressure 124/87, pulse 74, temperature 98.1 F (36.7 C), resp. rate 18, height 5\' 7"  (1.702 m), weight 82.2 kg, SpO2 95 %. No results found. Recent Labs    06/24/20 0133 06/25/20 0006  WBC 13.6* 12.5*  HGB 15.3 15.3  HCT 44.1 44.7  PLT 627* 665*   Recent Labs    06/24/20 0133 06/25/20 0006  NA 135 135  K 4.9 5.0  CL 101 101  CO2 24 20*  GLUCOSE 110* 98  BUN 40* 44*  CREATININE 1.34* 1.56*  CALCIUM 9.2 9.3    Intake/Output Summary (Last 24 hours) at 06/26/2020 1238 Last data filed at 06/26/2020 1010 Gross per 24 hour  Intake 123 ml  Output 500 ml  Net -377 ml     Pressure Injury 06/14/20 Buttocks Right Stage 2 -  Partial thickness loss of dermis presenting as a shallow open injury with a red, pink wound bed without slough. (Active)  06/14/20 0800  Location: Buttocks  Location Orientation: Right  Staging: Stage 2 -  Partial thickness loss of dermis presenting as a shallow open injury with a red, pink wound bed without slough.  Wound Description (Comments):   Present on Admission:     Physical Exam: BP 124/87 (BP Location: Right Arm)   Pulse 74   Temp 98.1 F (36.7 C)   Resp 18   Ht 5\' 7"  (1.702 m)   Wt 82.2 kg   SpO2 95%   BMI 28.38 kg/m  Constitutional: No distress . Vital signs reviewed. HENT: Normocephalic.  Atraumatic. Eyes: EOMI. No discharge. Cardiovascular: No JVD.  RRR. Respiratory: Normal effort.  No stridor.  Bilateral clear to auscultation. GI: Non-distended.  BS +. Skin: Warm and dry.  Intact. Psych: Normal mood.  Normal behavior. Musc: No edema in extremities.  No tenderness in extremities. Neuro: Alert and oriented Motor: Grossly 4+-5/5 throughout  Assessment/Plan: 1.  Functional deficits which require 3+ hours per day of interdisciplinary therapy in a comprehensive inpatient rehab setting.  Physiatrist is providing close team supervision and 24 hour management of active medical problems listed below.  Physiatrist and rehab team continue to assess barriers to discharge/monitor patient progress toward functional and medical goals   Care Tool:  Bathing              Bathing assist       Upper Body Dressing/Undressing Upper body dressing        Upper body assist      Lower Body Dressing/Undressing Lower body dressing            Lower body assist       Toileting Toileting Toileting Activity did not occur (Clothing management and hygiene only):  (pt used urinal)  Toileting assist Assist for toileting: Independent     Transfers Chair/bed transfer  Transfers assist     Chair/bed transfer assist level: Contact Guard/Touching assist     Locomotion Ambulation   Ambulation assist      Assist level: Minimal Assistance - Patient > 75% Assistive device: No Device Max distance: 564ft   Walk 10 feet activity   Assist     Assist level: Minimal Assistance - Patient > 75% Assistive device: No Device   Walk 50 feet activity   Assist  Assist level: Minimal Assistance - Patient > 75% Assistive device: No Device    Walk 150 feet activity   Assist    Assist level: Minimal Assistance - Patient > 75% Assistive device: No Device    Walk 10 feet on uneven surface  activity   Assist Walk 10 feet on uneven surfaces activity did not occur: N/A         Wheelchair     Assist Will patient use wheelchair at discharge?: No             Wheelchair 50 feet with 2 turns activity    Assist            Wheelchair 150 feet activity     Assist           Medical Problem List and Plan: 1. Right side hemiparesis with altered mental status secondary to acute embolic left side infarcts  Begin  CIR evaluations  Discussed with therapies, SLP ordered 2.  Antithrombotics: -DVT/anticoagulation: Bilateral lobar segmental pulmonary emboli.   Eliquis             -antiplatelet therapy: Aspirin 81 mg daily, Plavix will follow up on necessity 3. Pain Management: Tylenol as needed 4. Mood: Lexapro 10 mg daily             -antipsychotic agents: N/A 5. Neuropsych: This patient is capable of making decisions on his own behalf. 6. Skin/Wound Care: Routine skin checks 7. Fluids/Electrolytes/Nutrition: Routine in and outs. 8.  Recurrent VT complicated by cardiogenic shock.  Status post [place] ICD dual-chamber pacemaker placement 06/19/2020.    Continue amiodarone 200 mg twice daily, Entresto 97-103 mg twice daily, digoxin 0.125 mg daily, and Aldactone 25 mg daily, Toprol 25 mg daily.   Dig level within normal limits on 12/13  Monitor with increased exertion 9.  CAD status post CABG 12/15/2012 with recent inferior STEMI 05/03/2020-06/04/2020.  Continue aspirin therapy.  Follow-up cardiology services 10.  Hyperlipidemia.  Lipitor/Zetia 11.  Decreased nutritional support.  Albumin low at 3.3 on 12/12. Diet has been advanced to regular. Follow-up speech therapy/nutritional management.  13. Elevated liver enzymes.   History of ETOH use as per wife.   ALT mildly elevated on 12/12, continue to monitor  Continue to monitor.  14.  Leukocytosis  WBCs 12.5 on 12/12, continue monitor  Afebrile 15.  AKI  Creatinine 1.56 on 12/12  Encourage fluids  Continue to monitor 16.  Sleep disturbance  Melatonin started on 12/13  LOS: 1 days A FACE TO FACE EVALUATION WAS PERFORMED  Shaneca Orne Lorie Phenix 06/26/2020, 12:38 PM

## 2020-06-26 NOTE — Plan of Care (Signed)
  Problem: RH Balance Goal: LTG Patient will maintain dynamic standing with ADLs (OT) Description: LTG:  Patient will maintain dynamic standing balance with assist during activities of daily living (OT)  Flowsheets (Taken 06/26/2020 1241) LTG: Pt will maintain dynamic standing balance during ADLs with: Independent with assistive device   Problem: Sit to Stand Goal: LTG:  Patient will perform sit to stand in prep for activites of daily living with assistance level (OT) Description: LTG:  Patient will perform sit to stand in prep for activites of daily living with assistance level (OT) Flowsheets (Taken 06/26/2020 1241) LTG: PT will perform sit to stand in prep for activites of daily living with assistance level: Independent with assistive device   Problem: RH Grooming Goal: LTG Patient will perform grooming w/assist,cues/equip (OT) Description: LTG: Patient will perform grooming with assist, with/without cues using equipment (OT) Flowsheets (Taken 06/26/2020 1241) LTG: Pt will perform grooming with assistance level of: Independent with assistive device    Problem: RH Bathing Goal: LTG Patient will bathe all body parts with assist levels (OT) Description: LTG: Patient will bathe all body parts with assist levels (OT) Flowsheets (Taken 06/26/2020 1241) LTG: Pt will perform bathing with assistance level/cueing: Set up assist    Problem: RH Dressing Goal: LTG Patient will perform upper body dressing (OT) Description: LTG Patient will perform upper body dressing with assist, with/without cues (OT). Flowsheets (Taken 06/26/2020 1241) LTG: Pt will perform upper body dressing with assistance level of: Independent with assistive device Goal: LTG Patient will perform lower body dressing w/assist (OT) Description: LTG: Patient will perform lower body dressing with assist, with/without cues in positioning using equipment (OT) Flowsheets (Taken 06/26/2020 1241) LTG: Pt will perform lower body  dressing with assistance level of: Independent with assistive device   Problem: RH Toileting Goal: LTG Patient will perform toileting task (3/3 steps) with assistance level (OT) Description: LTG: Patient will perform toileting task (3/3 steps) with assistance level (OT)  Flowsheets (Taken 06/26/2020 1241) LTG: Pt will perform toileting task (3/3 steps) with assistance level: Independent with assistive device   Problem: RH Toilet Transfers Goal: LTG Patient will perform toilet transfers w/assist (OT) Description: LTG: Patient will perform toilet transfers with assist, with/without cues using equipment (OT) Flowsheets (Taken 06/26/2020 1241) LTG: Pt will perform toilet transfers with assistance level of: Independent with assistive device   Problem: RH Tub/Shower Transfers Goal: LTG Patient will perform tub/shower transfers w/assist (OT) Description: LTG: Patient will perform tub/shower transfers with assist, with/without cues using equipment (OT) Flowsheets (Taken 06/26/2020 1241) LTG: Pt will perform tub/shower stall transfers with assistance level of: Set up assist

## 2020-06-26 NOTE — Progress Notes (Signed)
[name] Ribas, MD  Physician  Physical Medicine and Rehabilitation  Consult Note      Signed  Date of Service:  06/16/2020  2:51 PM      Related encounter: ED to Hosp-Admission (Discharged) from 06/04/2020 in [facility] 4E CV SURGICAL PROGRESSIVE CARE       Signed      Expand All Collapse All             Physical Medicine and Rehabilitation Consult     Reason for Consult: Stroke with functional deficits.  Referring Physician: Dr. Sherral Hammers.      HPI: [name] is a 64 y.o. male with history of CAD s/p CABG with recent NSTEMI 10/20 with revascularization and question of LV thrombus and EF 25% therefore LifeVest placed. He was admitted on 06/04/20 with VT arrest in setting of severe ICM and treated with required  Amiodarone, lidocaine, atropine as well as external pacing and was intubated for air way protection. He underwent cardiac cath showing normal anatomy. 2D echo showed EF 25-30% with linear filling defect and was started on IV heparin due to concerns of thrombus. He had difficulty with vent wean and developed fevers with leucocytosis and purulent respiratory secretions. . Cefipime/Vanc added due to concern so of PNA.  TEE done 11/24 showing EF 30-35% and no thrombus, PFO or ASD noted. He was maintained on IV amiodarone with plans for ICD. He tolerated extubation and NGT placed for nutritional support.  placement post extubation.  On 11/30, he was found to have RUE weakness as well as speech difficulty with confusion therefore code stroke initiated.      CTA head neck was negative for LVO or ischemia but showed lobar and segmental PE in upper lungs. CTA lungs done for follow up and showed bilateral multifocal PE --majority acute in addition to subacute and chronic thrombus in LLL PA.  MRI brain done revealing small acute infarcts in left frontal precentral gyrus and juxtacortical left frontal white matter with mild edema. Dr. Rory Percy recommended addition of anticoagulation for  treatment of embolic stroke as well as bilateral PE's. He had 60 minutes of slavo's of NSVT on 12/01 with plans for ICD placement but has had intermittent issues with confusion. Per chart, patient has been having intermittent confusion with memory deficits for the past year. Cardiology would like to defer ICD placement till next week depending on WE events.Therapy ongoing and patient noted to have unsteady gait with fatigue and SOB with activity. CIR recommended due to functional decline.      Review of Systems  Constitutional: Negative for fever.  HENT: Negative for hearing loss.   Eyes: Negative for blurred vision and double vision.  Respiratory: Positive for shortness of breath (if he pushes himself). Negative for cough.   Cardiovascular: Negative for chest pain and palpitations.  Gastrointestinal: Positive for heartburn. Negative for abdominal pain and nausea.  Musculoskeletal: Positive for falls. Negative for joint pain and myalgias.  Skin: Negative for rash.  Neurological: Positive for weakness. Negative for dizziness and headaches.  Psychiatric/Behavioral: Positive for memory loss.          Past Medical History:  Diagnosis Date  . Borderline diabetic    . Coronary artery disease      a. s/p CABG in 2014 with LIMA-LAD, SVG-OM2 and SVG-RC b. 03/2019: STEMI and s/p angioplasty to proximal SVG-RCA and DES to SVG-RCA anastomosis c. 04/2020: recurrent STEMI with angioplasty of SVG-RCA  . Dyslipidemia 01/11/2013  . Hiatal hernia    .  Hypertension    . Mini stroke ([place])      2  . S/P CABG x 3    . STEMI (ST elevation myocardial infarction) ([place])    . Stroke [facility])             Past Surgical History:  Procedure Laterality Date  . CARDIAC CATHETERIZATION      . CORONARY ARTERY BYPASS GRAFT N/A 09/01/2012    Procedure: CORONARY ARTERY BYPASS GRAFTING (CABG);  Surgeon: [place]e Pollack, MD;  Location: [place];  Service: Open Heart Surgery;  Laterality: N/A;  Coronary Artery Bypass Grafting  Times Three Using Left Internal Mammary Artery and Left Saphenous leg Vein Harvested Endoscopically  . CORONARY STENT INTERVENTION N/A 03/23/2019    Procedure: CORONARY STENT INTERVENTION;  Surgeon: Lorretta Harp, MD;  Location: [place] CV LAB;  Service: Cardiovascular;  Laterality: N/A;  . CORONARY/GRAFT ACUTE MI REVASCULARIZATION N/A 03/23/2019    Procedure: Coronary/Graft Acute MI Revascularization;  Surgeon: Lorretta Harp, MD;  Location: [place] CV LAB;  Service: Cardiovascular;  Laterality: N/A;  . CORONARY/GRAFT ACUTE MI REVASCULARIZATION N/A 05/03/2020    Procedure: Coronary/Graft Acute MI Revascularization;  Surgeon: Belva Crome, MD;  Location: [place] CV LAB;  Service: Cardiovascular;  Laterality: N/A;  . IABP INSERTION N/A 06/04/2020    Procedure: IABP Insertion;  Surgeon: Martinique, Peter M, MD;  Location: [place] CV LAB;  Service: Cardiovascular;  Laterality: N/A;  . INTRAOPERATIVE TRANSESOPHAGEAL ECHOCARDIOGRAM N/A 09/01/2012    Procedure: INTRAOPERATIVE TRANSESOPHAGEAL ECHOCARDIOGRAM;  Surgeon: [place] Pollack, MD;  Location: [place] OR;  Service: Open Heart Surgery;  Laterality: N/A;  . LEFT HEART CATH AND CORONARY ANGIOGRAPHY N/A 03/23/2019    Procedure: LEFT HEART CATH AND CORONARY ANGIOGRAPHY;  Surgeon: Lorretta Harp, MD;  Location: [place] CV LAB;  Service: Cardiovascular;  Laterality: N/A;  . LEFT HEART CATH AND CORONARY ANGIOGRAPHY N/A 05/03/2020    Procedure: LEFT HEART CATH AND CORONARY ANGIOGRAPHY;  Surgeon: Belva Crome, MD;  Location: [place] CV LAB;  Service: Cardiovascular;  Laterality: N/A;  . LEFT HEART CATHETERIZATION WITH CORONARY ANGIOGRAM N/A 08/31/2012    Procedure: LEFT HEART CATHETERIZATION WITH CORONARY ANGIOGRAM;  Surgeon: Peter M Martinique, MD;  Location: [facility] CATH LAB;  Service: Cardiovascular;  Laterality: N/A;  . PERCUTANEOUS CORONARY STENT INTERVENTION (PCI-S) N/A 08/31/2012    Procedure: PERCUTANEOUS CORONARY STENT INTERVENTION (PCI-S);   Surgeon: Peter M Martinique, MD;  Location: [facility] CATH LAB;  Service: Cardiovascular;  Laterality: N/A;  . RIGHT/LEFT HEART CATH AND CORONARY/GRAFT ANGIOGRAPHY N/A 06/04/2020    Procedure: RIGHT/LEFT HEART CATH AND CORONARY/GRAFT ANGIOGRAPHY;  Surgeon: Martinique, Peter M, MD;  Location: [place] CV LAB;  Service: Cardiovascular;  Laterality: N/A;  . TEMPORARY PACEMAKER N/A 06/04/2020    Procedure: TEMPORARY PACEMAKER;  Surgeon: Martinique, Peter M, MD;  Location: [place] CV LAB;  Service: Cardiovascular;  Laterality: N/A;           Family History  Problem Relation Age of Onset  . Cancer Father    . Hypertension Father    . Hyperlipidemia Father    . Heart attack Father    . Hypertension Sister    . Hypertension Brother    . Hyperlipidemia Brother    . Heart attack Sister    . Heart attack Brother        Social History: Cira Rue is retired. . 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 He  reports that he has  quit smoking. His smoking use included pipe. He quit smokeless tobacco use about 51 years ago. He reports that he does not drink alcohol and does not use drugs.      Allergies: No Known Allergies            Medications Prior to Admission  Medication Sig Dispense Refill  . aspirin 81 MG chewable tablet Chew 81 mg by mouth daily.      . clopidogrel (PLAVIX) 75 MG tablet Take 1 tablet (75 mg total) by mouth daily. 90 tablet 3  . escitalopram (LEXAPRO) 10 MG tablet Take 1 tablet (10 mg total) by mouth daily. 30 tablet 11  . ezetimibe (ZETIA) 10 MG tablet Take 1 tablet (10 mg total) by mouth daily. 90 tablet 3  . metoprolol succinate (TOPROL-XL) 50 MG 24 hr tablet Take 1.5 tablets (75 mg total) by mouth daily. Take with or immediately following a meal. 135 tablet 1  . Multiple Vitamins-Minerals (ONE-A-DAY MENS 50+ ADVANTAGE) TABS Take 1 tablet by mouth daily with breakfast.      . nitroGLYCERIN (NITROSTAT) 0.4 MG SL tablet Place 1 tablet (0.4 mg total) under the  tongue every 5 (five) minutes x 3 doses as needed for chest pain. 25 tablet 2  . Omega-3 Fatty Acids (FISH OIL) 1000 MG CAPS Take 1,000 mg by mouth in the morning and at bedtime.      . sacubitril-valsartan (ENTRESTO) 49-51 MG Take 1 tablet by mouth 2 (two) times daily. 60 tablet 1  . ALPRAZolam (XANAX) 0.5 MG tablet Take 1 tablet (0.5 mg total) by mouth at bedtime as needed for anxiety. 20 tablet 0      Home: Home Living Family/patient expects to be discharged to:: Private residence Living Arrangements: Spouse/significant other Available Help at Discharge: Family, Available 24 hours/day Type of Home: Mobile home Home Access: Stairs to enter [employer] of Steps: 4 Entrance Stairs-Rails: Right, Left Home Layout: One level Bathroom Shower/Tub: Chiropodist: Standard Home Equipment: None  Functional History: Prior Function Level of Independence: Independent Comments: Works partime Functional Status:  Mobility: Bed Mobility Overal bed mobility: Needs Assistance Bed Mobility: Supine to Sit Supine to sit: HOB elevated, Min assist Sit to supine: Mod assist General bed mobility comments: +rail, increased time Transfers Overall transfer level: Needs assistance Equipment used: Rolling walker (2 wheeled) Transfers: Sit to/from Stand Sit to Stand: Supervision Stand pivot transfers: Min guard General transfer comment: received in recliner.  Able to walk to sink for ADL Ambulation/Gait Ambulation/Gait assistance: Mod assist Gait Distance (Feet): 5 Feet Assistive device: Rolling walker (2 wheeled) Gait Pattern/deviations: Decreased step length - left, Decreased step length - right, Trunk flexed General Gait Details: Unsteady. Poor coordination. Cues for sequencing and safety. Gait velocity: decreased Gait velocity interpretation: <1.31 ft/sec, indicative of household ambulator   ADL: ADL Overall ADL's : Needs assistance/impaired Eating/Feeding:  Independent, Sitting Eating/Feeding Details (indicate cue type and reason): Patient able to eat lunch this date: regular diet with thin liquids. Grooming: Wash/dry face, Standing, Supervision/safety Grooming Details (indicate cue type and reason): While sitting at EOB, pt washing his face. However, reaching for washcloth initially with reaching with RUE, having difficulty with coorindation and grasp, and switching to reach with LUE. Asking pt which is his dominant hand, and he reports R. Pt proceeds to wash face with LUE as he is having dififculty coorindating RUE. Unable to bring wash cloth to face with R.  Upper Body Bathing: Supervision/ safety, Sitting Lower Body Bathing: Minimal  assistance, Sit to/from stand Upper Body Dressing : Minimal assistance, Sitting Lower Body Dressing: Minimal assistance, Sit to/from stand Lower Body Dressing Details (indicate cue type and reason): Continued difficulty in coordination of movement Toilet Transfer: Min guard, RW, Ambulation Toilet Transfer Details (indicate cue type and reason): simualated to recliner, significant need for cues to pivot backwards as pt impulsively speeds up and begins to desend before he is at the chair Functional mobility during ADLs: Rolling walker, Min guard General ADL Comments: Increased shortness of breath noted.   Cognition: Cognition Overall Cognitive Status: Impaired/Different from baseline Arousal/Alertness: Awake/alert Orientation Level: Oriented to person, Oriented to situation, Disoriented to time, Disoriented to place Attention: Focused, Sustained Focused Attention: Appears intact Sustained Attention: Impaired Memory: Impaired Memory Impairment: Storage deficit, Retrieval deficit Awareness: Impaired Awareness Impairment: Emergent impairment Cognition Arousal/Alertness: Awake/alert Behavior During Therapy: WFL for tasks assessed/performed Overall Cognitive Status: Impaired/Different from baseline Area of  Impairment: Orientation, Memory, Following commands, Safety/judgement, Awareness, Problem solving Orientation Level: Person, Place, Situation Memory: Decreased short-term memory Following Commands: Follows one step commands with increased time, Follows multi-step commands inconsistently Safety/Judgement: Decreased awareness of safety, Decreased awareness of deficits Awareness: Emergent Problem Solving: Slow processing, Requires verbal cues General Comments: multimodal cues for safety     Blood pressure (!) 165/95, pulse 73, temperature 97.7 F (36.5 C), temperature source Oral, resp. rate (!) 25, height 5\' 7"  (1.702 m), weight 87.7 kg, SpO2 93 %. Physical Exam   General: Alert and oriented x2, No apparent distress HEENT: Head is normocephalic, atraumatic, PERRLA, EOMI, sclera anicteric, oral mucosa pink and moist, dentition intact, ext ear canals clear,  Neck: Supple without JVD or lymphadenopathy Heart: Reg rate and rhythm. No murmurs rubs or gallops Chest: CTA bilaterally without wheezes, rales, or rhonchi; no distress Abdomen: Soft, non-tender, non-distended, bowel sounds positive. Extremities: No clubbing, cyanosis, or edema. Pulses are 2+ Skin: Clean and intact without signs of breakdown Neuro: Speech clear. Able to recall today's date. Able to follow basic commands without difficulty. RLE with decrease in fine motor control.  5/5 strength throughout. Psych: Pt's affect is appropriate. Pt is cooperative    Lab Results Last 24 Hours       Results for orders placed or performed during the hospital encounter of 06/04/20 (from the past 24 hour(s))  Glucose, capillary     Status: None    Collection Time: 06/15/20  5:18 PM  Result Value Ref Range    Glucose-Capillary 95 70 - 99 mg/dL  Glucose, capillary     Status: Abnormal    Collection Time: 06/15/20  8:49 PM  Result Value Ref Range    Glucose-Capillary 115 (H) 70 - 99 mg/dL  Surgical PCR screen     Status: None    Collection  Time: 06/15/20 10:50 PM    Specimen: Nasal Mucosa; Nasal Swab  Result Value Ref Range    MRSA, PCR NEGATIVE NEGATIVE    Staphylococcus aureus NEGATIVE NEGATIVE  Urinalysis, Routine w reflex microscopic Urine, Clean Catch     Status: Abnormal    Collection Time: 06/15/20 11:20 PM  Result Value Ref Range    Color, Urine YELLOW YELLOW    APPearance HAZY (A) CLEAR    Specific Gravity, Urine 1.017 1.005 - 1.030    pH 8.0 5.0 - 8.0    Glucose, UA 50 (A) NEGATIVE mg/dL    Hgb urine dipstick LARGE (A) NEGATIVE    Bilirubin Urine NEGATIVE NEGATIVE    Ketones, ur NEGATIVE NEGATIVE mg/dL  Protein, ur 100 (A) NEGATIVE mg/dL    Nitrite POSITIVE (A) NEGATIVE    Leukocytes,Ua NEGATIVE NEGATIVE    RBC / HPF >50 (H) 0 - 5 RBC/hpf    WBC, UA 0-5 0 - 5 WBC/hpf    Bacteria, UA RARE (A) NONE SEEN    Squamous Epithelial / LPF 0-5 0 - 5    Mucus PRESENT    Glucose, capillary     Status: Abnormal    Collection Time: 06/15/20 11:52 PM  Result Value Ref Range    Glucose-Capillary 142 (H) 70 - 99 mg/dL  Magnesium     Status: None    Collection Time: 06/16/20  1:25 AM  Result Value Ref Range    Magnesium 2.0 1.7 - 2.4 mg/dL  Ammonia     Status: Abnormal    Collection Time: 06/16/20  1:25 AM  Result Value Ref Range    Ammonia 57 (H) 9 - 35 umol/L  Glucose, capillary     Status: Abnormal    Collection Time: 06/16/20  3:46 AM  Result Value Ref Range    Glucose-Capillary 103 (H) 70 - 99 mg/dL  Comprehensive metabolic panel     Status: Abnormal    Collection Time: 06/16/20  7:19 AM  Result Value Ref Range    Sodium 139 135 - 145 mmol/L    Potassium 4.5 3.5 - 5.1 mmol/L    Chloride 105 98 - 111 mmol/L    CO2 23 22 - 32 mmol/L    Glucose, Bld 108 (H) 70 - 99 mg/dL    BUN 20 8 - 23 mg/dL    Creatinine, Ser 1.11 0.61 - 1.24 mg/dL    Calcium 8.9 8.9 - 10.3 mg/dL    Total Protein 7.3 6.5 - 8.1 g/dL    Albumin 3.1 (L) 3.5 - 5.0 g/dL    AST 67 (H) 15 - 41 U/L    ALT 82 (H) 0 - 44 U/L    Alkaline  Phosphatase 66 38 - 126 U/L    Total Bilirubin 1.3 (H) 0.3 - 1.2 mg/dL    GFR, Estimated >60 >60 mL/min    Anion gap 11 5 - 15  Glucose, capillary     Status: Abnormal    Collection Time: 06/16/20  8:51 AM  Result Value Ref Range    Glucose-Capillary 111 (H) 70 - 99 mg/dL  Glucose, capillary     Status: None    Collection Time: 06/16/20 12:16 PM  Result Value Ref Range    Glucose-Capillary 91 70 - 99 mg/dL      Imaging Results (Last 48 hours)  No results found.       Assessment/Plan: Diagnosis: Acute embolic left sided infarcts 1. Does the need for close, 24 hr/day medical supervision in concert with the patient's rehab needs make it unreasonable for this patient to be served in a less intensive setting? Yes 2. Co-Morbidities requiring supervision/potential complications: encephalopathy, deconditioning, elevated liver enzymes, leukocytosis, AKI, recurrent Vtach, cardiogenic shock 3. Due to bladder management, bowel management, safety, skin/wound care, disease management, medication administration, pain management and patient education, does the patient require 24 hr/day rehab nursing? Yes 4. Does the patient require coordinated care of a physician, rehab nurse, therapy disciplines of PT, OT, SLP to address physical and functional deficits in the context of the above medical diagnosis(es)? Yes Addressing deficits in the following areas: balance, endurance, locomotion, strength, transferring, bowel/bladder control, bathing, dressing, feeding, grooming, toileting, cognition and psychosocial support 5. Can the patient  actively participate in an intensive therapy program of at least 3 hrs of therapy per day at least 5 days per week? Yes 6. The potential for patient to make measurable gains while on inpatient rehab is excellent 7. Anticipated functional outcomes upon discharge from inpatient rehab are modified independent  with PT, modified independent with OT, modified independent with  SLP. 8. Estimated rehab length of stay to reach the above functional goals is: 7-10 days 9. Anticipated discharge destination: Home 10. Overall Rehab/Functional Prognosis: excellent   RECOMMENDATIONS: This patient's condition is appropriate for continued rehabilitative care in the following setting: CIR Patient has agreed to participate in recommended program. Yes Note that insurance prior authorization may be required for reimbursement for recommended care.   Comment: Thank you for this consult. Admission coordinator to follow.    I have personally performed a face to face diagnostic evaluation, including, but not limited to relevant history and physical exam findings, of this patient and developed relevant assessment and plan.  Additionally, I have reviewed and concur with the physician assistant's documentation above.   Leeroy Cha, MD   Bary Leriche, PA-C 06/16/2020          Revision History                        Routing History                   Note Details  Author Ranell Patrick, Clide Deutscher, MD File Time 06/16/2020  9:07 PM  Author Type Physician Status Signed  Last Editor [name] Ribas, MD Service Physical Medicine and [place] # [id number] Admit Date 06/25/2020

## 2020-06-27 ENCOUNTER — Inpatient Hospital Stay (HOSPITAL_COMMUNITY): Payer: Medicare HMO

## 2020-06-27 ENCOUNTER — Inpatient Hospital Stay (HOSPITAL_COMMUNITY): Payer: Medicare HMO | Admitting: Occupational Therapy

## 2020-06-27 DIAGNOSIS — R7303 Prediabetes: Secondary | ICD-10-CM

## 2020-06-27 DIAGNOSIS — I6310 Cerebral infarction due to embolism of unspecified precerebral artery: Secondary | ICD-10-CM

## 2020-06-27 MED ORDER — BLOOD PRESSURE CONTROL BOOK
Freq: Once | Status: AC
  Filled 2020-06-27: qty 1

## 2020-06-27 MED ORDER — MELATONIN 3 MG PO TABS
3.0000 mg | ORAL_TABLET | Freq: Every day | ORAL | Status: DC
  Administered 2020-06-27 – 2020-06-28 (×2): 3 mg via ORAL
  Filled 2020-06-27 (×2): qty 1

## 2020-06-27 NOTE — Progress Notes (Signed)
Occupational Therapy Session Note  Patient Details  Name: [name] MRN: [id number] Date of Birth: 11/29/1955  Today's Date: 06/27/2020 OT Individual Time: 1500-1530 OT Individual Time Calculation (min): 30 min    Short Term Goals: Week 1:  OT Short Term Goal 1 (Week 1): STGs=LTGs due to ELOS  Skilled Therapeutic Interventions/Progress Updates:    Pt sitting up in recliner, no c/o pain, agreeable to OT session.  Pt ambulated to ortho gym using RW with supervision and no LOB or rest breaks needed.  Pt participated in dynamic standing balance activity at BITS with RUE functional reach component to facilitate leaning outside BOS in various directions.  Pt completed with supervision with one VC needed to prevent premature stand to sit at armchair.  Pt demonstrated good follow through after VC provided initially and approaching chair safely each stand to sit.  Pt ambulated to room using RW with supervision and no rest breaks needed.  Stand to sit at recliner with supervision. Call bell in reach, seat alarm on.  Therapy Documentation Precautions:  Precautions Precautions: Fall,ICD/Pacemaker Restrictions Weight Bearing Restrictions: No LUE Weight Bearing: Weight bearing as tolerated Other Position/Activity Restrictions: 5-10lb weight limit in Left arm, okay to lift arm overhead. Avoid extreme external rotation/abduction   Therapy/Group: Individual Therapy  [name] 06/27/2020, 4:49 PM

## 2020-06-27 NOTE — Progress Notes (Signed)
Speech Language Pathology Discharge Summary  Patient Details  Name: [name] MRN: [id number] Date of Birth: 03-Aug-1955  Today's Date: 06/27/2020 SLP Individual Time: 2924-4628 SLP Individual Time Calculation (min): 43 min   Skilled Therapeutic Interventions:  Skilled ST services focused on education and cognitive skills.SLP was able to reach pt's wife via phone. She confirmed pt is at cognitive baseline and lead a routine/simple life. He has a baseline deficit in short term recall and she completes complex tasks. SLP provided education pertaining to memory strategies and all questions were answered to satisfaction. SLP administered cognitive linguistic assessment Cognistat, pt scored WFL and severe deficit in memory. SLP provided education with handout on memory strategies. Pt expressed being at cognitive baseline and education is complete, no further ST services needed at this time. Pt was left in room with call bell within reach and chair alarm set.       Patient has met 4 of 4 long term goals.  Patient to discharge at overall Supervision;Min level.  Reasons goals not met: Pt is baseline function with baseline deficits   Clinical Impression/Discharge Summary:   Pt is at cognitive linguistic baseline confirmed by wife, with a history of deficits in short term recall. Family was present and unable to be reach on evaluation to confirm baseline function. SLP administered Cognistat to assess cognitive linguistic skills at a reduced level of complexity, pt scored WFL on all subtest, except for short term recall which was severe. P's wife confirmed completing majority of complex tasks and pt lives a routine life.SLP provided education with a handout on memory strategies to pt and relayed education to pt's wife. All questions answered to satisfaction and no further ST services needed at this time.   Care Partner:  Caregiver Able to Provide Assistance: Yes  Type of Caregiver Assistance:  Cognitive;Physical  Recommendation:  None      Equipment: N/A   Reasons for discharge: Other (comment) (Pt is at baseline function and education is complete)   Patient/Family Agrees with Progress Made and Goals Achieved: Yes (wife)    Bond Grieshop  [facility] 06/27/2020, 4:24 PM

## 2020-06-27 NOTE — Plan of Care (Signed)
  Problem: Consults Goal: RH STROKE PATIENT EDUCATION Description: See Patient Education module for education specifics  Outcome: Progressing   Problem: RH BOWEL ELIMINATION Goal: RH STG MANAGE BOWEL WITH ASSISTANCE Description: STG Manage Bowel with MOD I Assistance. Outcome: Progressing   Problem: RH BLADDER ELIMINATION Goal: RH STG MANAGE BLADDER WITH ASSISTANCE Description: STG Manage Bladder With MOD I Assistance Outcome: Progressing   Problem: RH SKIN INTEGRITY Goal: RH STG SKIN FREE OF INFECTION/BREAKDOWN Description: Skin will be free of infection/breakdown with min assist Outcome: Progressing   Problem: RH SAFETY Goal: RH STG ADHERE TO SAFETY PRECAUTIONS W/ASSISTANCE/DEVICE Description: STG Adhere to Safety Precautions With Min Assistance/Device. Outcome: Progressing   Problem: RH KNOWLEDGE DEFICIT Goal: RH STG INCREASE KNOWLEDGE OF STROKE PROPHYLAXIS Description: Pt will increase knowledge of stroke prophylaxis through hand outs and stroke teaching with min assist Outcome: Progressing   Problem: Education: Goal: Ability to verbalize understanding of medication therapies will improve Description: With cues/reminders Outcome: Progressing Goal: Individualized Educational Video(s) Outcome: Progressing   Problem: Cardiac: Goal: Ability to achieve and maintain adequate cardiopulmonary perfusion will improve Description: Weight will be maintained or decreased and no signs of decompensation during the hospital stay using medications and dietary modifications with cues/reminders Outcome: Progressing

## 2020-06-27 NOTE — Progress Notes (Signed)
Occupational Therapy Session Note  Patient Details  Name: [name] MRN: [id number] Date of Birth: July 11, 1956  Today's Date: 06/27/2020 OT Individual Time: 1045-1200 OT Individual Time Calculation (min): 75 min    Short Term Goals: Week 1:  OT Short Term Goal 1 (Week 1): STGs=LTGs due to ELOS  Therapeutic Interventions/Progress Updates:      Pt seen for BADL retraining of toileting, bathing, and dressing with a focus on balance and endurance. See ADL documentation below.    Pt received in recliner. Initially worked on dynamic balance exercises in the room with high knee marches and heel raises using bed rail for support.   Ambulated to bathroom with no AD with S and undressed, showered in standing, dressed.    He then ambulated with S to CGA to ADL apt to practice stepping over tub wall with S.  Sat on low couch and stood without A.  In gym with support from // bars, pt stood on foam balance mat and repeated exercises he did earlier in his room.  Walked back to his room with CGA only for safety as pt did not actually need the physical support.    Resting in recliner with all needs met. Alarm set.   Pt did express a desire to go home by the end of the week and agreed that would be reasonable, will talk with his team.  Therapy Documentation Precautions:  Precautions Precautions: Fall,ICD/Pacemaker Restrictions Weight Bearing Restrictions: No LUE Weight Bearing: Weight bearing as tolerated Other Position/Activity Restrictions: 5-10lb weight limit in Left arm, okay to lift arm overhead. Avoid extreme external rotation/abduction    Vital Signs: Therapy Vitals Pulse Rate: (!) 54 Pain: Pain Assessment Pain Score: 0-No pain ADL: ADL Grooming: Supervision/safety Where Assessed-Grooming: Standing at sink Upper Body Bathing: Setup Where Assessed-Upper Body Bathing: Shower Lower Body Bathing: Supervision/safety Where Assessed-Lower Body Bathing: Shower Upper Body  Dressing: Setup Where Assessed-Upper Body Dressing: Chair Lower Body Dressing: Setup Where Assessed-Lower Body Dressing: Chair Toileting: Not assessed Toilet Transfer: Close supervision Toilet Transfer Method: Ambulating (RW) Tub/Shower Transfer: Close supervison Clinical cytogeneticist Method: Optometrist: Energy manager: Close supervision Social research officer, government Method: Heritage manager: Grab bars   Therapy/Group: Individual Therapy  [place] 06/27/2020, 12:12 PM

## 2020-06-27 NOTE — Progress Notes (Signed)
Patient ID: [name], male   DOB: 1956/01/15, 64 y.o.   MRN: [id number] Met with the patient to review role of the nurse CM and collaboration with the SW to facilitate preparation for discharge. Reviewed secondary stroke risks including CAD, prediabetes, HLD, and HTN along with DAPT and HF. Patient given handouts on HTN and Zone plan for HF along with DASH diet, dyslipidemia. Reviewed recommended diet modifications and protein deficiency with Alb. Level 3.3. No questions or concerns noted at present. Continue to follow along to discharge to review educational needs. Margarito Liner

## 2020-06-27 NOTE — Progress Notes (Signed)
[facility] PHYSICAL MEDICINE & REHABILITATION PROGRESS NOTE  Subjective/Complaints: Patient seen laying in bed this AM.  He states he slept fairly overnight, he notes improvement with medications.  He states he had a good first day of therapies.  ROS: Denies CP, SOB, N/V/D  Objective: Vital Signs: Blood pressure 117/76, pulse (!) 54, temperature 98.1 F (36.7 C), temperature source Oral, resp. rate 16, height 5\' 7"  (1.702 m), weight 82.5 kg, SpO2 99 %. No results found. Recent Labs    06/25/20 0006  WBC 12.5*  HGB 15.3  HCT 44.7  PLT 665*   Recent Labs    06/25/20 0006  NA 135  K 5.0  CL 101  CO2 20*  GLUCOSE 98  BUN 44*  CREATININE 1.56*  CALCIUM 9.3    Intake/Output Summary (Last 24 hours) at 06/27/2020 1035 Last data filed at 06/27/2020 0900 Gross per 24 hour  Intake 360 ml  Output 725 ml  Net -365 ml     Pressure Injury 06/14/20 Buttocks Right Stage 2 -  Partial thickness loss of dermis presenting as a shallow open injury with a red, pink wound bed without slough. (Active)  06/14/20 0800  Location: Buttocks  Location Orientation: Right  Staging: Stage 2 -  Partial thickness loss of dermis presenting as a shallow open injury with a red, pink wound bed without slough.  Wound Description (Comments):   Present on Admission:     Physical Exam: BP 117/76 (BP Location: Right Arm)   Pulse (!) 54   Temp 98.1 F (36.7 C) (Oral)   Resp 16   Ht 5\' 7"  (1.702 m)   Wt 82.5 kg   SpO2 99%   BMI 28.49 kg/m  Constitutional: No distress . Vital signs reviewed. HENT: Normocephalic.  Atraumatic. Eyes: EOMI. No discharge. Cardiovascular: No JVD.  Bradycardia. Respiratory: Normal effort.  No stridor.  Bilateral clear to auscultation. GI: Non-distended.  BS +. Skin: Warm and dry.  Intact. Psych: Normal mood.  Normal behavior. Musc: No edema in extremities.  No tenderness in extremities. Neuro: Alert and oriented x3 Motor: Grossly 4+-5/5 throughout,  unchanged  Assessment/Plan: 1. Functional deficits which require 3+ hours per day of interdisciplinary therapy in a comprehensive inpatient rehab setting.  Physiatrist is providing close team supervision and 24 hour management of active medical problems listed below.  Physiatrist and rehab team continue to assess barriers to discharge/monitor patient progress toward functional and medical goals   Care Tool:  Bathing    Body parts bathed by patient: Right arm,Left arm,Chest,Abdomen,Front perineal area,Buttocks,Right upper leg,Left upper leg,Right lower leg,Left lower leg,Face         Bathing assist Assist Level: Supervision/Verbal cueing     Upper Body Dressing/Undressing Upper body dressing   What is the patient wearing?: Pull over shirt    Upper body assist Assist Level: Set up assist    Lower Body Dressing/Undressing Lower body dressing      What is the patient wearing?: Pants,Underwear/pull up     Lower body assist Assist for lower body dressing: Contact Guard/Touching assist     Toileting Toileting Toileting Activity did not occur (Clothing management and hygiene only): N/A (no void or bm)  Toileting assist Assist for toileting: Independent with assistive device Assistive Device Comment:  (urinal)   Transfers Chair/bed transfer  Transfers assist     Chair/bed transfer assist level: Contact Guard/Touching assist     Locomotion Ambulation   Ambulation assist      Assist level: Minimal Assistance -  Patient > 75% Assistive device: No Device Max distance: 555ft   Walk 10 feet activity   Assist     Assist level: Minimal Assistance - Patient > 75% Assistive device: No Device   Walk 50 feet activity   Assist    Assist level: Minimal Assistance - Patient > 75% Assistive device: No Device    Walk 150 feet activity   Assist    Assist level: Minimal Assistance - Patient > 75% Assistive device: No Device    Walk 10 feet on uneven  surface  activity   Assist Walk 10 feet on uneven surfaces activity did not occur: N/A         Wheelchair     Assist Will patient use wheelchair at discharge?: No             Wheelchair 50 feet with 2 turns activity    Assist            Wheelchair 150 feet activity     Assist           Medical Problem List and Plan: 1. Right side hemiparesis with altered mental status secondary to acute embolic left side infarcts  Continue CIR  2.  Antithrombotics: -DVT/anticoagulation: Bilateral lobar segmental pulmonary emboli.   Eliquis             -antiplatelet therapy: Aspirin 81 mg daily through 12/19, Plavix will follow up on necessity 3. Pain Management: Tylenol as needed 4. Mood: Lexapro 10 mg daily             -antipsychotic agents: N/A 5. Neuropsych: This patient is capable of making decisions on his own behalf. 6. Skin/Wound Care: Routine skin checks 7. Fluids/Electrolytes/Nutrition: Routine in and outs. 8.  Recurrent VT complicated by cardiogenic shock.  Status post Bledsoe ICD dual-chamber pacemaker placement 06/19/2020.    Continue amiodarone 200 mg twice daily, Entresto 97-103 mg twice daily, digoxin 0.125 mg daily, and Aldactone 25 mg daily, Toprol 25 mg daily.   Dig level within normal limits on 12/13  HR labile on 12/14, cont to monitor  Monitor with increased exertion 9.  CAD status post CABG 12/15/2012 with recent inferior STEMI 05/03/2020-06/04/2020.  Continue aspirin therapy.  Follow-up cardiology services 10.  Hyperlipidemia.  Lipitor/Zetia 11.  Decreased nutritional support.  Albumin low at 3.3 on 12/12. Diet has been advanced to regular. Follow-up speech therapy/nutritional management.  13. Elevated liver enzymes.   History of ETOH use as per wife.   ALT mildly elevated on 12/12, continue to monitor  Continue to monitor.  14.  Leukocytosis  WBCs 12.5 on 12/12, continue monitor  Afebrile 15.  AKI  Creatinine 1.56 on 12/12, plan order  labs for later this week  Encourage fluids  Continue to monitor 16.  Sleep disturbance  Melatonin started on 12/13, increased on 12/14 17.  Prediabetes  Hemoglobin A1c 5.8 on 12/1  Continue to monitor  LOS: 2 days A FACE TO FACE EVALUATION WAS PERFORMED  Aseel [place] Lorie Phenix 06/27/2020, 10:35 AM

## 2020-06-27 NOTE — Progress Notes (Signed)
Physical Therapy Session Note  Patient Details  Name: [name] MRN: [id number] Date of Birth: 1956/06/17  Today's Date: 06/27/2020 PT Individual Time: 0900-0958 PT Individual Time Calculation (min): 58 min   Short Term Goals: Week 1:  PT Short Term Goal 1 (Week 1): STG = LTG due to ELOS  Skilled Therapeutic Interventions/Progress Updates:     Pt greeted L sidelying in bed, agreeable to PT session without reports of pain. He has questions regarding discharge and when he can expect to go home. Pt educated on team conference date and pt was pleasantly agreeable. Continues to show flat affect but cooperative throughout session. Performed L sidelying to sitting with supervision with  HOB slightly elevated and use of bed rail. Sit>stand with supervision with no AD and ambulated with CGA sinkside where he brushed his teeth and washed his face with supervision, no LOB. He ambulated with CGA from his room to ortho gym, ~225ft, with no AD. Gait deficits include decreased bilateral step length, slightly narrow BOS, and increased unsteadiness with 180deg and 90 deg turns. After a seated rest break, he completed the 6MWT in hallways on level surfaces with therapist providing close supervision.   6MWT = 866ft. x1 brief standing rest break at 5th minute. Age norm for healthy 41-69 y/o males = 1,854ft.  After additional seated rest break from completing 6MWT, he ambulated with close supervision to ortho gym and transferred to Nustep with CGA. He completed x8 minutes with BLE's only, focusing on cardiovascular endurance and lower extremity strengthening. Workload set to 5 and steps per minute ~50.   Then performed BITS in unsupported standing, complete dual-cog activities including verbal/visual sequencing presentations, maze test, as well as numerical sequencing #1-#25. Pt without LOB during this and was able to complete up to 5 verbal/visual sequencing. Pt then ambulated back to his room with supervision  and no AD, stand>sit with supervision to recliner. Ended session seated in recliner with chair alarm on and needs in reach.  Therapy Documentation Precautions:  Precautions Precautions: Fall,ICD/Pacemaker Restrictions Weight Bearing Restrictions: No LUE Weight Bearing: Weight bearing as tolerated Other Position/Activity Restrictions: 5-10lb weight limit in Left arm, okay to lift arm overhead. Avoid extreme external rotation/abduction  Therapy/Group: Individual Therapy  Dyesha Henault P Dee Paden PT 06/27/2020, 7:38 AM

## 2020-06-28 ENCOUNTER — Inpatient Hospital Stay (HOSPITAL_COMMUNITY): Payer: Medicare HMO

## 2020-06-28 ENCOUNTER — Inpatient Hospital Stay (HOSPITAL_COMMUNITY): Payer: Medicare HMO | Admitting: Occupational Therapy

## 2020-06-28 DIAGNOSIS — R001 Bradycardia, unspecified: Secondary | ICD-10-CM

## 2020-06-28 DIAGNOSIS — I5020 Unspecified systolic (congestive) heart failure: Secondary | ICD-10-CM

## 2020-06-28 MED ORDER — METOPROLOL SUCCINATE ER 25 MG PO TB24
12.5000 mg | ORAL_TABLET | Freq: Every day | ORAL | Status: DC
  Administered 2020-06-29: 09:00:00 12.5 mg via ORAL
  Filled 2020-06-28: qty 1

## 2020-06-28 NOTE — Progress Notes (Signed)
  Discussed bradycardia with PA [place].   Pt noted to have asymptomatic bradycardia in the 50s that has not interfered with therapy.   He has ICD in place.   Pt on amiodarone, digoxin, and metoprolol.   We will decrease metoprolol to 12.5 mg daily for now.   I will perform his wound/device check prior to his discharge tomorrow.   Legrand Como [address] [place], PA-C  06/28/2020 3:14 PM

## 2020-06-28 NOTE — Progress Notes (Signed)
Inpatient Rehabilitation Care Coordinator  Discharge Note  The overall goal for the admission was met for:   Discharge location: Yes-HOME WITH WIFE WHO CAN ASSIST IF NEEDED  Length of Stay: Yes-5 DAYS  Discharge activity level: Yes-MOD/I LEVEL  Home/community participation: Yes  Services provided included: MD, RD, PT, OT, RN, CM, Pharmacy and SW  Financial Services: Private Insurance: [employer]  Follow-up services arranged: Home Health: [facility]-PT and Patient/Family has no preference for HH/DME agencies  Comments (or additional information):PT DID WELL AND REACHED MOD/I LEVEL. READY TO GO HOME, WIFE CAN ASSIST  Patient/Family verbalized understanding of follow-up arrangements: Yes  Individual responsible for coordination of the follow-up plan: [name]-WIFE [phone]  Confirmed correct DME delivered: Elease Hashimoto 06/28/2020    Elease Hashimoto

## 2020-06-28 NOTE — Discharge Instructions (Signed)
Inpatient Rehab Discharge Instructions  [name] Discharge date and time: No discharge date for patient encounter.   Activities/Precautions/ Functional Status: Activity: activity as tolerated Diet: regular diet Wound Care: Routine skin checks Functional status:  ___ No restrictions     ___ Walk up steps independently ___ 24/7 supervision/assistance   ___ Walk up steps with assistance ___ Intermittent supervision/assistance  ___ Bathe/dress independently ___ Walk with walker     _x__ Bathe/dress with assistance ___ Walk Independently    ___ Shower independently ___ Walk with assistance    ___ Shower with assistance ___ No alcohol     ___ Return to work/school ________  Special Instructions: No driving smoking or alcohol    COMMUNITY REFERRALS UPON DISCHARGE:    Home Health:   [facility]   Phone: [phone]  Medical Equipment/Items Ordered:has all needed equipment for home                                                    STROKE/TIA DISCHARGE INSTRUCTIONS SMOKING Cigarette smoking nearly doubles your risk of having a stroke & is the single most alterable risk factor  If you smoke or have smoked in the last 12 months, you are advised to quit smoking for your health.  Most of the excess cardiovascular risk related to smoking disappears within a year of stopping.  Ask you doctor about anti-smoking medications  [place] Quit Line: 1-800-QUIT NOW  Free Smoking Cessation Classes [phone]  CHOLESTEROL Know your levels; limit fat & cholesterol in your diet  Lipid Panel     Component Value Date/Time   CHOL 131 06/14/2020 0548   CHOL 126 10/12/2019 0835   TRIG 165 (H) 06/14/2020 0548   HDL 25 (L) 06/14/2020 0548   HDL 29 (L) 10/12/2019 0835   CHOLHDL 5.2 06/14/2020 0548   VLDL 33 06/14/2020 0548   LDLCALC 73 06/14/2020 0548   LDLCALC 58 10/12/2019 0835      Many patients benefit from treatment even if their cholesterol is at  goal.  Goal: Total Cholesterol (CHOL) less than 160  Goal:  Triglycerides (TRIG) less than 150  Goal:  HDL greater than 40  Goal:  LDL (LDLCALC) less than 100   BLOOD PRESSURE American Stroke Association blood pressure target is less that 120/80 mm/Hg  Your discharge blood pressure is:  BP: 124/87  Monitor your blood pressure  Limit your salt and alcohol intake  Many individuals will require more than one medication for high blood pressure  DIABETES (A1c is a blood sugar average for last 3 months) Goal HGBA1c is under 7% (HBGA1c is blood sugar average for last 3 months)  Diabetes: No known diagnosis of diabetes    Lab Results  Component Value Date   HGBA1C 5.8 (H) 06/14/2020     Your HGBA1c can be lowered with medications, healthy diet, and exercise.  Check your blood sugar as directed by your physician  Call your physician if you experience unexplained or low blood sugars.  PHYSICAL ACTIVITY/REHABILITATION Goal is 30 minutes at least 4 days per week  Activity: Increase activity slowly, Therapies: Physical Therapy: Home Health Return to work:   Activity decreases your risk of heart attack and stroke and makes your  heart stronger.  It helps control your weight and blood pressure; helps you relax and can improve your mood.  Participate in a regular exercise program.  Talk with your doctor about the best form of exercise for you (dancing, walking, swimming, cycling).  DIET/WEIGHT Goal is to maintain a healthy weight  Your discharge diet is:  Diet Order            Diet regular Room service appropriate? Yes; Fluid consistency: Thin  Diet effective now                 liquids Your height is:  Height: 5\' 7"  (170.2 cm) Your current weight is: Weight: 82.2 kg Your Body Mass Index (BMI) is:  BMI (Calculated): 28.38  Following the type of diet specifically designed for you will help prevent another stroke.  Your goal weight range is:    Your goal Body Mass Index (BMI) is  19-24.  Healthy food habits can help reduce 3 risk factors for stroke:  High cholesterol, hypertension, and excess weight.  RESOURCES Stroke/Support Group:  Call [phone]   STROKE EDUCATION PROVIDED/REVIEWED AND GIVEN TO PATIENT Stroke warning signs and symptoms How to activate emergency medical system (call 911). Medications prescribed at discharge. Need for follow-up after discharge. Personal risk factors for stroke. Pneumonia vaccine given:  Flu vaccine given:  My questions have been answered, the writing is legible, and I understand these instructions.  I will adhere to these goals & educational materials that have been provided to me after my discharge from the hospital.    Supplemental Discharge Instructions for  Pacemaker/Defibrillator Patients   Activity No heavy lifting or vigorous activity with your left/right arm for 6 to 8 weeks.  Do not raise your left/right arm above your head for one week.  Gradually raise your affected arm as drawn below.             06/20/2020                06/21/2020                 06/22/2020               06/23/2020 __  NO DRIVING for 6 months.  WOUND CARE - Keep the wound area clean and dry.  Do not get this area wet , no showers for one week; you may shower on  06/23/2020   . The tape/steri-strips on your wound will fall off; do not pull them off.  No bandage is needed on the site.  DO  NOT apply any creams, oils, or ointments to the wound area. - If you notice any drainage or discharge from the wound, any swelling or bruising at the site, or you develop a fever > 101? F after you are discharged home, call the office at once.  Special Instructions - You are still able to use cellular telephones; use the ear opposite the side where you have your pacemaker/defibrillator.  Avoid carrying your cellular phone near your device. - When traveling through airports, show security personnel your identification card to avoid being screened in the metal  detectors.  Ask the security personnel to use the hand wand. - Avoid arc welding equipment, MRI testing (magnetic resonance imaging), TENS units (transcutaneous nerve stimulators).  Call the office for questions about other devices. - Avoid electrical appliances that are in poor condition or are not properly grounded. - Microwave ovens are safe to be near or to operate.  My questions have been answered and I understand these instructions. I will adhere to these goals and the provided educational materials after my discharge from the hospital.  Patient/Caregiver Signature _______________________________ Date __________  Clinician Signature _______________________________________ Date __________  Please bring this form and your medication list with you to all your follow-up doctor's appointments.

## 2020-06-28 NOTE — Progress Notes (Signed)
Occupational Therapy Session Note  Patient Details  Name: [name] MRN: [id number] Date of Birth: 04-06-1956  Today's Date: 06/28/2020 OT Individual Time: 6734-1937 OT Individual Time Calculation (min): 70 min    Short Term Goals: Week 1:  OT Short Term Goal 1 (Week 1): STGs=LTGs due to ELOS  Skilled Therapeutic Interventions/Progress Updates:    Pt sleeping in recliner upon arrival. Pt agreeable to therapy but declined shower this morning. OT intervention with focus on functional amb without AD, standing balance, BLE therex, discharge planning, tub transfers, activity tolerance, and safety awareness to increase independence with BADLs. Pt amb without AD throughout unit and all gym areas without AD. BLE therex on NuStep (7 mins level 3 BLE only). Tub transfers stepping over into tub using grab bars. Pt states he has grab bars at home. Pt competed transfer with supervision. Pt participated in [place] activities on compliant (Airex) surface and non compliant surface with supervision. Pt amb to main gym and engaged in rebounder activities on compliant and noncompliant surrace-supervision. Pt amb to day room and engaged in Wii balance activities (penguin/ice). Pt completed all activities with distant supervision. Pt returned to room and sat in recliner. Pt made independent in room. Safety plan changed. Nursing staff notified.  Therapy Documentation Precautions:  Precautions Precautions: Fall,ICD/Pacemaker Restrictions Weight Bearing Restrictions: No LUE Weight Bearing: Weight bearing as tolerated Other Position/Activity Restrictions: 5-10lb weight limit in Left arm, okay to lift arm overhead. Avoid extreme external rotation/abduction  Pain:  Pt denies pain     Therapy/Group: Individual Therapy  Leroy Libman 06/28/2020, 11:54 AM

## 2020-06-28 NOTE — Progress Notes (Signed)
Patient ID: [name], male   DOB: [birth year], 64 y.o.   MRN: [id number]  Met with pt to discuss team conference goals mod/i level and discharge date 12/16. He is very pleased with this and would like home health versus OP due to distance it is from his home. Will try to locate HHPT to see him. He has the equipment he needs at home already.

## 2020-06-28 NOTE — Discharge Summary (Addendum)
Physician Discharge Summary  Patient ID: [name] MRN: [id number] DOB/AGE: [birth year] 64 y.o.  Admit date: 06/25/2020 Discharge date: 06/29/2020  Discharge Diagnoses:  Principal Problem:   Cerebral infarction [facility]) Active Problems:   Status post CVA   Debility   AKI (acute kidney injury) (HCC)   Leukocytosis   Right hemiparesis (HCC)   Recurrent ventricular tachycardia (HCC)   Transaminitis   Sleep disturbance   NYHA class 1 heart failure with reduced ejection fraction (HCC)   Bradycardia CAD with CABG Hyperlipidemia  Discharged Condition: Stable  Significant Diagnostic Studies: CT Code Stroke CTA Head W/WO contrast  Result Date: 06/13/2020 CLINICAL DATA:  Right-sided weakness EXAM: CT ANGIOGRAPHY HEAD AND NECK CT PERFUSION BRAIN TECHNIQUE: Multidetector CT imaging of the head and neck was performed using the standard protocol during bolus administration of intravenous contrast. Multiplanar CT image reconstructions and MIPs were obtained to evaluate the vascular anatomy. Carotid stenosis measurements (when applicable) are obtained utilizing NASCET criteria, using the distal internal carotid diameter as the denominator. Multiphase CT imaging of the brain was performed following IV bolus contrast injection. Subsequent parametric perfusion maps were calculated using RAPID software. CONTRAST:  166mL OMNIPAQUE IOHEXOL 350 MG/ML SOLN COMPARISON:  Noncontrast head CT from earlier today FINDINGS: CTA NECK FINDINGS Aortic arch: Delayed contrast bolus. No acute finding. There has been CABG. Atherosclerosis Right carotid system: Limited atheromatous changes without flow limiting stenosis, ulceration, or dissection. Left carotid system: Mixed density plaque at the proximal left ICA without ulceration, flow limiting stenosis, or dissection. Vertebral arteries: Limited assessment due to the poor bolus quality. The left vertebral artery is strongly dominant diffusely patent to the dura. The  hypoplastic right vertebral artery is only faintly enhancing. No proximal subclavian stenosis. Skeleton: Cervical spine degeneration. Dental caries with periapical erosions. Other neck: No visible inflammation or incidental mass. Feeding tube is in place. Upper chest: Pulmonary embolism seen at the left lower lobar artery and in segmental right upper lobe branches. Especially the right upper lobe emboli appear branching and recent. Review of the MIP images confirms the above findings CTA HEAD FINDINGS Anterior circulation: Suboptimal bolus density. Calcified plaque at both carotid siphons without flow limiting stenosis seen. Negative for aneurysm. Negative for branch occlusion or beading. Posterior circulation: Atheromatous plaque of the left vertebral and basilar arteries. The distal left V4 segment there is moderate or advanced stenosis, measurement limited by poor bolus density. No branch occlusion, beading, or aneurysm. Venous sinuses: Not opacified. Anatomic variants: None significant Review of the MIP images confirms the above findings CT Brain Perfusion Findings: ASPECTS: 10 CBF (<30%) Volume: 41mL Perfusion (Tmax>6.0s) volume: 55mL when accounting for artifactual signal at the superior sagittal sinus Critical Value/emergent results were called by telephone at the time of interpretation on 06/13/2020 at 9:30 am to provider [facility] , who verbally acknowledged these results. IMPRESSION: 1. No emergent large vessel occlusion or infarct/ischemia by perfusion. 2. Lobar and segmental pulmonary emboli incidentally noted in the upper lungs. 3. Minimal flow in the hypoplastic right vertebral artery. There is moderate to advanced atheromatous narrowing of the left V4 segment. 4. Carotid atherosclerosis without flow limiting stenosis or ulceration. 5. Suboptimal bolus. Electronically Signed   By: Monte Fantasia M.D.   On: 06/13/2020 09:33   DG Chest 2 View  Result Date: 06/20/2020 CLINICAL DATA:  AICD. EXAM:  CHEST - 2 VIEW COMPARISON:  Chest x-ray 06/18/2020. FINDINGS: Feeding tube noted with tip over the stomach. AICD noted with lead tips over the right atrium and  right ventricle. Prior CABG. Heart size normal. Low lung volumes. No focal infiltrate. No pleural effusion or pneumothorax. IMPRESSION: 1. Feeding tube noted with tip over the stomach. 2. AICD noted with lead tips over the right atrium and right ventricle. Prior CABG. Heart size normal. 3. No acute pulmonary disease.  No pneumothorax. Electronically Signed   By: Marcello Moores  Register   On: 06/20/2020 06:45   DG Abd 1 View  Result Date: 06/11/2020 CLINICAL DATA:  Check gastric catheter placement EXAM: ABDOMEN - 1 VIEW COMPARISON:  Film from earlier in the same day. FINDINGS: Gastric catheter is again noted within the stomach although slightly withdrawn when compared with the prior exam. Scattered large and small bowel gas is noted. No obstructive changes are seen. IMPRESSION: Gastric catheter within the stomach. Electronically Signed   By: Inez Catalina M.D.   On: 06/11/2020 21:14   DG Abd 1 View  Result Date: 06/11/2020 CLINICAL DATA:  Nasogastric tube placement. EXAM: ABDOMEN - 1 VIEW COMPARISON:  Abdominal radiograph performed the same day. FINDINGS: An enteric tube terminates in the stomach. Nonobstructive bowel gas pattern is noted. IMPRESSION: Enteric tube terminates in the stomach. Electronically Signed   By: Zerita Boers M.D.   On: 06/11/2020 14:17   DG Abd 1 View  Result Date: 06/11/2020 CLINICAL DATA:  Orogastric tube placement EXAM: ABDOMEN - 1 VIEW COMPARISON:  June 06, 2020 FINDINGS: Orogastric tube tip is in the proximal stomach with the side port at the gastroesophageal junction. There is no bowel dilatation or air-fluid level to suggest bowel obstruction. No free air seen on supine examination. IMPRESSION: Orogastric tube tip in proximal stomach with side port at gastroesophageal junction. It may be prudent to consider advancing  tube 5-6 cm. No bowel obstruction or free air evident on supine examination. Electronically Signed   By: Lowella Grip III M.D.   On: 06/11/2020 11:00   DG Abd 1 View  Result Date: 06/06/2020 CLINICAL DATA:  OG tube placement EXAM: ABDOMEN - 1 VIEW COMPARISON:  June 06, 2020 FINDINGS: The OG tube appears to project over the gastric antrum. The visualized bowel gas pattern is nonspecific. IMPRESSION: OG tube projects over the gastric antrum. Electronically Signed   By: Constance Holster M.D.   On: 06/06/2020 19:30   DG Abd 1 View  Result Date: 06/04/2020 CLINICAL DATA:  OG tube placement EXAM: ABDOMEN - 1 VIEW COMPARISON:  None. FINDINGS: The OG tube projects over the gastric body. A femoral approach Swan-Ganz catheter is noted with tip projecting over the right pulmonary artery. The visualized bowel gas pattern is nonspecific and nonobstructive. IMPRESSION: OG tube projects over the gastric body. Electronically Signed   By: Constance Holster M.D.   On: 06/04/2020 19:17   CT Code Stroke CTA Neck W/WO contrast  Result Date: 06/13/2020 CLINICAL DATA:  Right-sided weakness EXAM: CT ANGIOGRAPHY HEAD AND NECK CT PERFUSION BRAIN TECHNIQUE: Multidetector CT imaging of the head and neck was performed using the standard protocol during bolus administration of intravenous contrast. Multiplanar CT image reconstructions and MIPs were obtained to evaluate the vascular anatomy. Carotid stenosis measurements (when applicable) are obtained utilizing NASCET criteria, using the distal internal carotid diameter as the denominator. Multiphase CT imaging of the brain was performed following IV bolus contrast injection. Subsequent parametric perfusion maps were calculated using RAPID software. CONTRAST:  167mL OMNIPAQUE IOHEXOL 350 MG/ML SOLN COMPARISON:  Noncontrast head CT from earlier today FINDINGS: CTA NECK FINDINGS Aortic arch: Delayed contrast bolus. No acute finding. There  has been CABG. Atherosclerosis  Right carotid system: Limited atheromatous changes without flow limiting stenosis, ulceration, or dissection. Left carotid system: Mixed density plaque at the proximal left ICA without ulceration, flow limiting stenosis, or dissection. Vertebral arteries: Limited assessment due to the poor bolus quality. The left vertebral artery is strongly dominant diffusely patent to the dura. The hypoplastic right vertebral artery is only faintly enhancing. No proximal subclavian stenosis. Skeleton: Cervical spine degeneration. Dental caries with periapical erosions. Other neck: No visible inflammation or incidental mass. Feeding tube is in place. Upper chest: Pulmonary embolism seen at the left lower lobar artery and in segmental right upper lobe branches. Especially the right upper lobe emboli appear branching and recent. Review of the MIP images confirms the above findings CTA HEAD FINDINGS Anterior circulation: Suboptimal bolus density. Calcified plaque at both carotid siphons without flow limiting stenosis seen. Negative for aneurysm. Negative for branch occlusion or beading. Posterior circulation: Atheromatous plaque of the left vertebral and basilar arteries. The distal left V4 segment there is moderate or advanced stenosis, measurement limited by poor bolus density. No branch occlusion, beading, or aneurysm. Venous sinuses: Not opacified. Anatomic variants: None significant Review of the MIP images confirms the above findings CT Brain Perfusion Findings: ASPECTS: 10 CBF (<30%) Volume: 56mL Perfusion (Tmax>6.0s) volume: 75mL when accounting for artifactual signal at the superior sagittal sinus Critical Value/emergent results were called by telephone at the time of interpretation on 06/13/2020 at 9:30 am to provider [facility] , who verbally acknowledged these results. IMPRESSION: 1. No emergent large vessel occlusion or infarct/ischemia by perfusion. 2. Lobar and segmental pulmonary emboli incidentally noted in the upper  lungs. 3. Minimal flow in the hypoplastic right vertebral artery. There is moderate to advanced atheromatous narrowing of the left V4 segment. 4. Carotid atherosclerosis without flow limiting stenosis or ulceration. 5. Suboptimal bolus. Electronically Signed   By: Monte Fantasia M.D.   On: 06/13/2020 09:33   CT ANGIO CHEST PE W OR WO CONTRAST  Result Date: 06/13/2020 CLINICAL DATA:  Pulmonary emboli on recent CTA of neck EXAM: CT ANGIOGRAPHY CHEST WITH CONTRAST TECHNIQUE: Multidetector CT imaging of the chest was performed using the standard protocol during bolus administration of intravenous contrast. Multiplanar CT image reconstructions and MIPs were obtained to evaluate the vascular anatomy. CONTRAST:  22mL OMNIPAQUE IOHEXOL 350 MG/ML SOLN COMPARISON:  CT of the neck from earlier in the same day. FINDINGS: Cardiovascular: Thoracic aorta demonstrates atherosclerotic calcifications and changes of prior coronary bypass grafting. Coronary calcifications are noted. No cardiac enlargement is noted. The pulmonary artery shows a normal branching pattern with significant bilateral pulmonary emboli the majority of which appears acute in nature. The changes in the lower lobe pulmonary artery on the left appear more subacute to chronic as there is apparent along the arterial wall. Mediastinum/Nodes: Thoracic inlet is within normal limits. No sizable hilar or mediastinal adenopathy is noted. Feeding catheter is noted extending into the distal stomach. The esophagus is within normal limits. Lungs/Pleura: Small pleural effusions are noted. The lungs are well aerated with mild lower lobe atelectatic changes right greater than left. No sizable parenchymal nodule is seen. Upper Abdomen: Vicarious excretion of contrast is noted within the gallbladder. The remainder of the upper abdomen is within limits. Musculoskeletal: Degenerative changes of the thoracic spine are seen. Review of the MIP images confirms the above findings.  IMPRESSION: Bilateral multifocal pulmonary emboli the majority of which appear acute in nature. Some subacute to chronic thrombus is noted within the left  lower lobe pulmonary artery. No right heart strain is noted. Bibasilar atelectatic changes with small effusions. Aortic Atherosclerosis (ICD10-I70.0). Electronically Signed   By: Inez Catalina M.D.   On: 06/13/2020 21:26   MR BRAIN WO CONTRAST  Result Date: 06/13/2020 CLINICAL DATA:  Stroke follow-up. EXAM: MRI HEAD WITHOUT CONTRAST TECHNIQUE: Multiplanar, multiecho pulse sequences of the brain and surrounding structures were obtained without intravenous contrast. COMPARISON:  Same day code stroke imaging. FINDINGS: Brain: Small acute infarcts involving the left frontal precentral gyrus (series 4, image 46) and the juxtacortical left frontal white matter (series 4, images 39 through 41). Mild associated without mass effect. Additional scattered T2/FLAIR hyperintensities in the white matter, compatible with chronic microvascular ischemic disease. Generalized cerebral volume loss with ex vacuo ventricular dilation. Remote lacunar infarct in right cerebellar hemisphere. No acute hemorrhage. No hydrocephalus. No mass lesion or abnormal mass effect. No extra-axial fluid collection. Vascular: Major arterial flow voids are maintained at the skull base. See same day CTA for further evaluation. Skull and upper cervical spine: Normal marrow signal. Sinuses/Orbits: Mild scattered paranasal sinus mucosal thickening without air-fluid levels. Unremarkable orbits. Other: Bilateral moderate mastoid effusions. IMPRESSION: 1. Small acute infarcts involving the left frontal precentral gyrus and the juxtacortical left frontal white matter. Mild associated edema without mass effect. 2. Chronic microvascular ischemic disease and generalized cerebral volume loss. Electronically Signed   By: Margaretha Sheffield MD   On: 06/13/2020 16:21   CARDIAC CATHETERIZATION  Result Date:  06/04/2020  Mid LAD lesion is 100% stenosed.  Prox Cx lesion is 100% stenosed.  Prox RCA lesion is 100% stenosed.  Dist RCA lesion is 20% stenosed.  Non-stenotic Origin to Prox Graft lesion was previously treated.  Prox Graft lesion is 30% stenosed.  LV end diastolic pressure is mildly elevated.  1. Severe 3 vessel occlusive CAD. 2. Patent LIMA to the LAD 3. Patent SVG to OM1 4. Patent SVG to RCA. Much improved flow from prior PCI. 5. Mildly elevated LVEDP 17 mm Hg 6. Successful placement of temporary transvenous pacemaker. Plan: Dr Haroldine Laws placed a [name] catheter via the right internal jugular Vein. Will leave temporary transvenous pacemaker in place so that antiarrhythmic drugs can be used liberally.  Right heart cath demonstrates good cardiac output and right heart pressures so we deferred placing an IABP. Will need right femoral arterial sheath removed manually. RHC Findings: Ao = 174/111 (138) LV = 169/16 RA =  7 RV = 41/6 PA = 37/20 (28) PCW = 14 Fick cardiac output/index = 11.6/5.7 PVR = 1.2 WU FA sat = 99% PA sat = 85%, 86% SVC sat = 89% High cardiac output with normal filling pressures. No evidence of intracardiac shunting.    CT Code Stroke Cerebral Perfusion with contrast  Result Date: 06/13/2020 CLINICAL DATA:  Right-sided weakness EXAM: CT ANGIOGRAPHY HEAD AND NECK CT PERFUSION BRAIN TECHNIQUE: Multidetector CT imaging of the head and neck was performed using the standard protocol during bolus administration of intravenous contrast. Multiplanar CT image reconstructions and MIPs were obtained to evaluate the vascular anatomy. Carotid stenosis measurements (when applicable) are obtained utilizing NASCET criteria, using the distal internal carotid diameter as the denominator. Multiphase CT imaging of the brain was performed following IV bolus contrast injection. Subsequent parametric perfusion maps were calculated using RAPID software. CONTRAST:  170mL OMNIPAQUE IOHEXOL 350 MG/ML SOLN  COMPARISON:  Noncontrast head CT from earlier today FINDINGS: CTA NECK FINDINGS Aortic arch: Delayed contrast bolus. No acute finding. There has been CABG. Atherosclerosis  Right carotid system: Limited atheromatous changes without flow limiting stenosis, ulceration, or dissection. Left carotid system: Mixed density plaque at the proximal left ICA without ulceration, flow limiting stenosis, or dissection. Vertebral arteries: Limited assessment due to the poor bolus quality. The left vertebral artery is strongly dominant diffusely patent to the dura. The hypoplastic right vertebral artery is only faintly enhancing. No proximal subclavian stenosis. Skeleton: Cervical spine degeneration. Dental caries with periapical erosions. Other neck: No visible inflammation or incidental mass. Feeding tube is in place. Upper chest: Pulmonary embolism seen at the left lower lobar artery and in segmental right upper lobe branches. Especially the right upper lobe emboli appear branching and recent. Review of the MIP images confirms the above findings CTA HEAD FINDINGS Anterior circulation: Suboptimal bolus density. Calcified plaque at both carotid siphons without flow limiting stenosis seen. Negative for aneurysm. Negative for branch occlusion or beading. Posterior circulation: Atheromatous plaque of the left vertebral and basilar arteries. The distal left V4 segment there is moderate or advanced stenosis, measurement limited by poor bolus density. No branch occlusion, beading, or aneurysm. Venous sinuses: Not opacified. Anatomic variants: None significant Review of the MIP images confirms the above findings CT Brain Perfusion Findings: ASPECTS: 10 CBF (<30%) Volume: 68mL Perfusion (Tmax>6.0s) volume: 23mL when accounting for artifactual signal at the superior sagittal sinus Critical Value/emergent results were called by telephone at the time of interpretation on 06/13/2020 at 9:30 am to provider [facility] , who verbally acknowledged  these results. IMPRESSION: 1. No emergent large vessel occlusion or infarct/ischemia by perfusion. 2. Lobar and segmental pulmonary emboli incidentally noted in the upper lungs. 3. Minimal flow in the hypoplastic right vertebral artery. There is moderate to advanced atheromatous narrowing of the left V4 segment. 4. Carotid atherosclerosis without flow limiting stenosis or ulceration. 5. Suboptimal bolus. Electronically Signed   By: Monte Fantasia M.D.   On: 06/13/2020 09:33   DG Chest Port 1 View  Result Date: 06/24/2020 CLINICAL DATA:  Leukocytosis. EXAM: PORTABLE CHEST 1 VIEW COMPARISON:  June 20, 2020. FINDINGS: The heart size and mediastinal contours are within normal limits. Both lungs are clear. Left-sided pacemaker is unchanged in position. Sternotomy wires are noted. No pneumothorax or pleural effusion is noted. The visualized skeletal structures are unremarkable. IMPRESSION: No active disease. Electronically Signed   By: Marijo Conception M.D.   On: 06/24/2020 08:36   DG CHEST PORT 1 VIEW  Result Date: 06/18/2020 CLINICAL DATA:  Follow-up pleural effusion EXAM: PORTABLE CHEST 1 VIEW COMPARISON:  06/13/2020 FINDINGS: Cardiac shadow is within normal limits. Feeding catheter extends into the stomach. Postsurgical changes are noted. The lungs are well aerated without focal infiltrate or effusion. No bony abnormality is seen. IMPRESSION: No acute abnormality noted. Electronically Signed   By: Inez Catalina M.D.   On: 06/18/2020 08:13   DG CHEST PORT 1 VIEW  Result Date: 06/07/2020 CLINICAL DATA:  Heart rate problem. EXAM: PORTABLE CHEST 1 VIEW COMPARISON:  June 07, 2020 (7:45 a.m.) FINDINGS: There is stable endotracheal tube, nasogastric tube and right internal jugular Swan-Ganz catheter positioning. A pacing lead is again seen likely entering via a femoral approach. Its distal tip projects just below the expected region of the diaphragm, along the midline. Mild, stable atelectasis and/or  infiltrate is seen within the retrocardiac region of the left lung base. Very mild stable linear atelectasis is also seen within the right lung base. A very small, stable left pleural effusion is seen. No pneumothorax is identified. The cardiac  silhouette is mildly enlarged and unchanged in size. The visualized skeletal structures are unremarkable. IMPRESSION: 1. Stable line positioning with stable left basilar atelectasis and/or infiltrate. 2. Very small, stable left pleural effusion. Electronically Signed   By: Virgina Norfolk M.D.   On: 06/07/2020 17:00   DG CHEST PORT 1 VIEW  Result Date: 06/07/2020 CLINICAL DATA:  Respiratory failure EXAM: PORTABLE CHEST 1 VIEW COMPARISON:  06/06/2020 FINDINGS: Cardiac shadow is enlarged but stable. Swan-Ganz catheter, endotracheal tube and gastric catheter are noted in satisfactory position. The overall inspiratory effort is poor with slight increase in left-sided pleural effusion. Mild bibasilar atelectasis is noted. IMPRESSION: Slight increase in left-sided pleural effusion. Mild bibasilar atelectasis. Tubes and lines as described above. Electronically Signed   By: Inez Catalina M.D.   On: 06/07/2020 08:17   DG CHEST PORT 1 VIEW  Result Date: 06/06/2020 CLINICAL DATA:  Intubation. EXAM: PORTABLE CHEST 1 VIEW COMPARISON:  June 06, 2020 FINDINGS: The endotracheal tube terminates above the carina by approximately 3 cm. The Swan-Ganz catheter tip projects over the main right pulmonary artery. The enteric tube appears to extend below the left hemidiaphragm. There is a femoral approach line projecting over the right ventricle which may represent a pacing lead. There are bilateral pleural effusions which have increased from prior study. The heart size is stable. Aortic calcifications are noted. There is no pneumothorax. IMPRESSION: 1. Lines and tubes as above. 2. Worsening bilateral pleural effusions. 3. No pneumothorax. The cardiac silhouette is stable in size from  prior study. Electronically Signed   By: Constance Holster M.D.   On: 06/06/2020 19:28   DG CHEST PORT 1 VIEW  Result Date: 06/06/2020 CLINICAL DATA:  Intubation.  Respiratory failure. EXAM: PORTABLE CHEST 1 VIEW COMPARISON:  06/04/2020. FINDINGS: Interim placement of Swan-Ganz catheter, its tip is over the right main pulmonary artery. Endotracheal tube and NG tube in stable position. Prior median sternotomy. Stable cardiomegaly. No pulmonary venous congestion. Mild bibasilar atelectasis/infiltrates. No pleural effusion or pneumothorax. IMPRESSION: 1. Interim placement of Swan-Ganz catheter, its tip is over the right main pulmonary artery. Endotracheal tube and NG tube in stable position. 2. Prior median sternotomy. Stable cardiomegaly. Electronically Signed   By: Marcello Moores  Register   On: 06/06/2020 07:05   DG Chest Port 1 View  Result Date: 06/04/2020 CLINICAL DATA:  Intubation EXAM: PORTABLE CHEST 1 VIEW COMPARISON:  Same day chest radiograph FINDINGS: Interval placement of endotracheal tube with distal tip terminating approximately 3.1 cm above the carina. Interval placement of enteric tube which courses below the diaphragm with distal tip beyond the inferior margin of the film. Prior median sternotomy. Multiple overlying cardiac leads and pacer pad. Stable cardiomegaly. Left basilar atelectasis. No evidence of pneumothorax. IMPRESSION: 1. Interval placement of endotracheal and enteric tubes, as described. 2. Left basilar atelectasis. Electronically Signed   By: Davina Poke D.O.   On: 06/04/2020 14:44   DG Chest Port 1 View  Result Date: 06/04/2020 CLINICAL DATA:  64 year old male with chest pain, ventricular tachycardia. EXAM: PORTABLE CHEST 1 VIEW COMPARISON:  Portable chest 03/23/2019 and earlier. FINDINGS: Portable AP semi upright view at 1140 hours. Pacer or resuscitation pads project over the chest. Stable mild cardiomegaly. Prior sternotomy. Other mediastinal contours are within normal  limits. Visualized tracheal air column is within normal limits. Allowing for portable technique the lungs are clear. No pneumothorax or pleural effusion. No acute osseous abnormality identified. IMPRESSION: Stable mild cardiomegaly.  No acute cardiopulmonary abnormality. Electronically Signed   By: Lemmie Evens  [place] Crane M.D.   On: 06/04/2020 12:26   DG Abd Portable 1V  Result Date: 06/06/2020 CLINICAL DATA:  Abdominal distension EXAM: PORTABLE ABDOMEN - 1 VIEW COMPARISON:  06/04/2020 FINDINGS: Partially visualized enteric tube is coiled within the stomach. Right femoral central venous catheter extends beyond the superior margin of the film. Nonobstructive bowel gas pattern. No gross free intraperitoneal air. IMPRESSION: Nonobstructive bowel gas pattern. Electronically Signed   By: Davina Poke D.O.   On: 06/06/2020 08:34   DG Swallowing Func-Speech Pathology  Result Date: 06/14/2020 Objective Swallowing Evaluation: Type of Study: MBS-Modified Barium Swallow Study  Patient Details Name: Koltin Wehmeyer MRN: [id number] Date of Birth: 03-26-1956 Today's Date: 06/14/2020 Time: SLP Start Time (ACUTE ONLY): 0900 -SLP Stop Time (ACUTE ONLY): 0918 SLP Time Calculation (min) (ACUTE ONLY): 18 min Past Medical History: Past Medical History: Diagnosis Date  Borderline diabetic   Coronary artery disease   a. s/p CABG in 2014 with LIMA-LAD, SVG-OM2 and SVG-RC b. 03/2019: STEMI and s/p angioplasty to proximal SVG-RCA and DES to SVG-RCA anastomosis c. 04/2020: recurrent STEMI with angioplasty of SVG-RCA  Dyslipidemia 01/11/2013  Hiatal hernia   Hypertension   Mini stroke ([place])   2  S/P CABG x 3   STEMI (ST elevation myocardial infarction) ([place])   Stroke [facility])  Past Surgical History: Past Surgical History: Procedure Laterality Date  CARDIAC CATHETERIZATION    CORONARY ARTERY BYPASS GRAFT N/A 09/01/2012  Procedure: CORONARY ARTERY BYPASS GRAFTING (CABG);  Surgeon: Gaye Pollack, MD;  Location: [place];  Service: Open Heart Surgery;   Laterality: N/A;  Coronary Artery Bypass Grafting Times Three Using Left Internal Mammary Artery and Left Saphenous leg Vein Harvested Endoscopically  CORONARY STENT INTERVENTION N/A 03/23/2019  Procedure: CORONARY STENT INTERVENTION;  Surgeon: Lorretta Harp, MD;  Location: [place] CV LAB;  Service: Cardiovascular;  Laterality: N/A;  CORONARY/GRAFT ACUTE MI REVASCULARIZATION N/A 03/23/2019  Procedure: Coronary/Graft Acute MI Revascularization;  Surgeon: Lorretta Harp, MD;  Location: [place] CV LAB;  Service: Cardiovascular;  Laterality: N/A;  CORONARY/GRAFT ACUTE MI REVASCULARIZATION N/A 05/03/2020  Procedure: Coronary/Graft Acute MI Revascularization;  Surgeon: Belva Crome, MD;  Location: [place] CV LAB;  Service: Cardiovascular;  Laterality: N/A;  IABP INSERTION N/A 06/04/2020  Procedure: IABP Insertion;  Surgeon: Martinique, Peter M, MD;  Location: [place] CV LAB;  Service: Cardiovascular;  Laterality: N/A;  INTRAOPERATIVE TRANSESOPHAGEAL ECHOCARDIOGRAM N/A 09/01/2012  Procedure: INTRAOPERATIVE TRANSESOPHAGEAL ECHOCARDIOGRAM;  Surgeon: Gaye Pollack, MD;  Location: [place] OR;  Service: Open Heart Surgery;  Laterality: N/A;  LEFT HEART CATH AND CORONARY ANGIOGRAPHY N/A 03/23/2019  Procedure: LEFT HEART CATH AND CORONARY ANGIOGRAPHY;  Surgeon: Lorretta Harp, MD;  Location: [place] CV LAB;  Service: Cardiovascular;  Laterality: N/A;  LEFT HEART CATH AND CORONARY ANGIOGRAPHY N/A 05/03/2020  Procedure: LEFT HEART CATH AND CORONARY ANGIOGRAPHY;  Surgeon: Belva Crome, MD;  Location: [place] CV LAB;  Service: Cardiovascular;  Laterality: N/A;  LEFT HEART CATHETERIZATION WITH CORONARY ANGIOGRAM N/A 08/31/2012  Procedure: LEFT HEART CATHETERIZATION WITH CORONARY ANGIOGRAM;  Surgeon: Peter M Martinique, MD;  Location: [facility] CATH LAB;  Service: Cardiovascular;  Laterality: N/A;  PERCUTANEOUS CORONARY STENT INTERVENTION (PCI-S) N/A 08/31/2012  Procedure: PERCUTANEOUS CORONARY STENT INTERVENTION (PCI-S);   Surgeon: Peter M Martinique, MD;  Location: [facility] CATH LAB;  Service: Cardiovascular;  Laterality: N/A;  RIGHT/LEFT HEART CATH AND CORONARY/GRAFT ANGIOGRAPHY N/A 06/04/2020  Procedure: RIGHT/LEFT HEART CATH AND CORONARY/GRAFT ANGIOGRAPHY;  Surgeon: Martinique, Peter M, MD;  Location: [place]  CV LAB;  Service: Cardiovascular;  Laterality: N/A;  TEMPORARY PACEMAKER N/A 06/04/2020  Procedure: TEMPORARY PACEMAKER;  Surgeon: Martinique, Peter M, MD;  Location: [place] CV LAB;  Service: Cardiovascular;  Laterality: N/A; HPI: 81 yoM with extensive cardiac history with recent STEMI 04/2020, D/Cd home on 10/23 with LIfevest, per notes was not using. Admitted 11/21 with Admitted 11/21 VT arrest, Acute on Chronic systolic HF with biventricular failure -> cardiogenic shock . Intubated and emergently taken to cath lab for temporary transvenous pacemaker and coronary angiogram.  Extubated 11/23 then reintubated four hours later, extubated 11/26.   Subjective: alert Assessment / Plan / Recommendation CHL IP CLINICAL IMPRESSIONS 06/14/2020 Clinical Impression Pt was seen for MBS which revealed a mild pharyngeal dysphagia. His oral phase of swallowing was characterized by timely manipulation and transit of POs. During the pharyngeal swallow, the pt demonstrated timely swallow initiation and adequate hyolaryngeal excursion. Epiglottic deflection was slightly reduced, leading to two instances of trace penetration/aspiration. Given 3 oz of thin liquid, one instance of trace penetration was observed during the swallow (PAS 2). While taking a pill with thin liquid, the pill became lodged in the valleculae. With additional sips of thin liquid to transit the pill, a trace amount of thin liquid entered the airway and fell below the vocal folds before being ejected out (PAS 6). During an esophageal sweep, the pill was observed to hesitate mid-esophagus. At this time, a regular diet and thin liquids are recommended. Provide meds whole with puree. Pt  has some R sided weakness that will require assistance with meals. SLP will f/u acutely for diet toleration.  SLP Visit Diagnosis Dysphagia, pharyngeal phase (R13.13) Attention and concentration deficit following -- Frontal lobe and executive function deficit following -- Impact on safety and function Mild aspiration risk   CHL IP TREATMENT RECOMMENDATION 06/14/2020 Treatment Recommendations Therapy as outlined in treatment plan below   Prognosis 06/14/2020 Prognosis for Safe Diet Advancement Good Barriers to Reach Goals -- Barriers/Prognosis Comment -- CHL IP DIET RECOMMENDATION 06/14/2020 SLP Diet Recommendations Regular solids;Thin liquid Liquid Administration via Cup;Straw Medication Administration Whole meds with puree Compensations Slow rate;Small sips/bites Postural Changes Seated upright at 90 degrees   CHL IP OTHER RECOMMENDATIONS 06/14/2020 Recommended Consults -- Oral Care Recommendations Oral care BID Other Recommendations --   CHL IP FOLLOW UP RECOMMENDATIONS 06/14/2020 Follow up Recommendations None   CHL IP FREQUENCY AND DURATION 06/14/2020 Speech Therapy Frequency (ACUTE ONLY) min 2x/week Treatment Duration 2 weeks      CHL IP ORAL PHASE 06/14/2020 Oral Phase WFL Oral - Pudding Teaspoon -- Oral - Pudding Cup -- Oral - Honey Teaspoon -- Oral - Honey Cup -- Oral - Nectar Teaspoon -- Oral - Nectar Cup -- Oral - Nectar Straw -- Oral - Thin Teaspoon -- Oral - Thin Cup -- Oral - Thin Straw -- Oral - Puree -- Oral - Mech Soft -- Oral - Regular -- Oral - Multi-Consistency -- Oral - Pill -- Oral Phase - Comment --  CHL IP PHARYNGEAL PHASE 06/14/2020 Pharyngeal Phase Impaired Pharyngeal- Pudding Teaspoon -- Pharyngeal -- Pharyngeal- Pudding Cup -- Pharyngeal -- Pharyngeal- Honey Teaspoon -- Pharyngeal -- Pharyngeal- Honey Cup -- Pharyngeal -- Pharyngeal- Nectar Teaspoon -- Pharyngeal -- Pharyngeal- Nectar Cup -- Pharyngeal -- Pharyngeal- Nectar Straw -- Pharyngeal -- Pharyngeal- Thin Teaspoon -- Pharyngeal --  Pharyngeal- Thin Cup WFL Pharyngeal -- Pharyngeal- Thin Straw Penetration/Aspiration during swallow Pharyngeal Material enters airway, remains ABOVE vocal cords then ejected out;Material enters airway, passes BELOW cords then ejected  out Pharyngeal- Puree WFL Pharyngeal -- Pharyngeal- Mechanical Soft -- Pharyngeal -- Pharyngeal- Regular WFL Pharyngeal -- Pharyngeal- Multi-consistency -- Pharyngeal -- Pharyngeal- Pill Pharyngeal residue - valleculae Pharyngeal -- Pharyngeal Comment --  No flowsheet data found. Herbie Baltimore, MA CCC-SLP Acute Rehabilitation Services Pager [phone] Office [phone]  Lynann Beaver 06/14/2020, 10:55 AM              ECHOCARDIOGRAM COMPLETE  Result Date: 06/05/2020    ECHOCARDIOGRAM REPORT   Patient Name:   [name] Date of Exam: 06/05/2020 Medical Rec #:  [id number]      Height:       67.0 in Accession #:    [id number]     Weight:       204.8 lb Date of Birth:  01/21/1956     BSA:          2.043 m Patient Age:    24 years       BP:           83/61 mmHg Patient Gender: M              HR:           70 bpm. Exam Location:  Inpatient Procedure: 2D Echo, Intracardiac Opacification Agent, Color Doppler and Cardiac            Doppler Indications:    Congestive Heart Failure 428.0 / I50.9  History:        Patient has prior history of Echocardiogram examinations, most                 recent 05/05/2020. CAD and Previous Myocardial Infarction, Prior                 CABG, Stroke; Risk Factors:Hypertension and Dyslipidemia.                 Transvenous pacemaker. Hiatal hernia.  Sonographer:    Darlina Sicilian RDCS Referring Phys: [id number] [place] E LIGGETT  Sonographer Comments: Suboptimal apical window and echo performed with patient supine and on artificial respirator. IMPRESSIONS  1. Technically difficult study with poor echo windows. Definity contrast given.  2. Left ventricular ejection fraction, by estimation, is 25 to 30%. The left ventricle has severely decreased  function. The left ventricle demonstrates global hypokinesis with regional variation including apical dyskineisis. There is moderate left ventricular hypertrophy. Left ventricular diastolic parameters are indeterminate. Linear filling defect at the apex, could be thrombus or false tendon.  3. Right ventricular systolic function was not well visualized. The right ventricular size is not well visualized.  4. The mitral valve was not well visualized. No evidence of mitral valve regurgitation.  5. The aortic valve is tricuspid. Aortic valve regurgitation is trivial. Mild aortic valve sclerosis is present, with no evidence of aortic valve stenosis.  6. Aortic dilatation noted. There is borderline dilatation of the aortic root, measuring 39 mm. There is mild dilatation of the ascending aorta, measuring 40 mm. Comparison(s): No significant change from prior study. 05/04/2020: LVEF 25-30%, 14 x 6 mm apical filing defect suspicious for LV thrombus. No change in apical filling defect. Suspect false tendon, however, cannot exclude thrombus given cardiomyopathy. FINDINGS  Left Ventricle: Linear filling defect at the apex, could be thrombus or false tendon. Left ventricular ejection fraction, by estimation, is 25 to 30%. The left ventricle has severely decreased function. The left ventricle demonstrates global hypokinesis. Mild dyskinesis of the left ventricular, apical apical segment. Definity contrast agent was given IV to  delineate the left ventricular endocardial borders. The left ventricular internal cavity size was normal in size. There is moderate left ventricular hypertrophy. Left ventricular diastolic function could not be evaluated due to paced rhythm. Left ventricular diastolic parameters are indeterminate. Right Ventricle: The right ventricular size is not well visualized. Right vetricular wall thickness was not well visualized. Right ventricular systolic function was not well visualized. Left Atrium: Left atrial  size was normal in size. Right Atrium: Right atrial size was not well visualized. Pericardium: There is no evidence of pericardial effusion. Mitral Valve: The mitral valve was not well visualized. No evidence of mitral valve regurgitation. Tricuspid Valve: The tricuspid valve is grossly normal. Tricuspid valve regurgitation is mild. Aortic Valve: The aortic valve is tricuspid. Aortic valve regurgitation is trivial. Mild aortic valve sclerosis is present, with no evidence of aortic valve stenosis. Pulmonic Valve: The pulmonic valve was normal in structure. Pulmonic valve regurgitation is not visualized. Aorta: Aortic dilatation noted. There is borderline dilatation of the aortic root, measuring 39 mm. There is mild dilatation of the ascending aorta, measuring 40 mm. Venous: IVC assessment for right atrial pressure unable to be performed due to mechanical ventilation. IAS/Shunts: The interatrial septum was not well visualized. Additional Comments: A pacer wire is visualized.  LEFT VENTRICLE PLAX 2D LVIDd:         5.60 cm LVIDs:         5.10 cm LV PW:         1.30 cm LV IVS:        1.50 cm LVOT diam:     2.50 cm LV SV:         41 LV SV Index:   20 LVOT Area:     4.91 cm  LV Volumes (MOD) LV vol d, MOD A2C: 174.0 ml LV vol d, MOD A4C: 170.0 ml LV vol s, MOD A2C: 128.0 ml LV vol s, MOD A4C: 131.0 ml LV SV MOD A2C:     46.0 ml LV SV MOD A4C:     170.0 ml LV SV MOD BP:      44.5 ml RIGHT VENTRICLE RV S prime:     9.03 cm/s TAPSE (M-mode): 0.9 cm LEFT ATRIUM             Index LA diam:        3.20 cm 1.57 cm/m LA Vol (A2C):   49.9 ml 24.43 ml/m LA Vol (A4C):   60.9 ml 29.81 ml/m LA Biplane Vol: 59.4 ml 29.08 ml/m  AORTIC VALVE LVOT Vmax:   55.50 cm/s LVOT Vmean:  39.800 cm/s LVOT VTI:    0.083 m  AORTA Ao Root diam: 3.90 cm Ao Asc diam:  4.00 cm TRICUSPID VALVE TR Peak grad:   19.2 mmHg TR Vmax:        219.00 cm/s  SHUNTS Systemic VTI:  0.08 m Systemic Diam: 2.50 cm Lyman Bishop MD Electronically signed by Lyman Bishop MD Signature Date/Time: 06/05/2020/12:42:27 PM    Final    ECHOCARDIOGRAM LIMITED BUBBLE STUDY  Result Date: 06/14/2020    ECHOCARDIOGRAM LIMITED REPORT   Patient Name:   [name] Date of Exam: 06/14/2020 Medical Rec #:  [id number]      Height:       67.0 in Accession #:    [id number]     Weight:       194.4 lb Date of Birth:  05-19-1956     BSA:  1.998 m Patient Age:    34 years       BP:           167/114 mmHg Patient Gender: M              HR:           87 bpm. Exam Location:  Inpatient Procedure: Limited Echo and Saline Contrast Bubble Study Indications:    Stroke; bubble study to r/o PFO  History:        Patient has prior history of Echocardiogram examinations, most                 recent 06/07/2020. CAD and Previous Myocardial Infarction, Prior                 CABG; Risk Factors:Hypertension.  Sonographer:    Mikki Santee RDCS (AE) Sonographer#2:  Clayton Lefort RDCS (AE) Referring Phys: [id number] RENEE LYNN [place]  1. Left ventricular ejection fraction, by gross estimation, is 25 to 30% (assessed in one view). The left ventricle has severely decreased function.  2. The right ventricular size is mildly enlarged.  3. Agitated saline contrast bubble study was negative, with no evidence of any interatrial shunt. Comparison(s): No significant change from prior study. Limited study- bubble- without evidence of interatrial shunt. FINDINGS  Left Ventricle: Left ventricular ejection fraction, by estimation, is 25 to 30%. The left ventricle has severely decreased function. Right Ventricle: The right ventricular size is mildly enlarged. IAS/Shunts: Agitated saline contrast was given intravenously to evaluate for intracardiac shunting. Agitated saline contrast bubble study was negative, with no evidence of any interatrial shunt. Rudean Haskell MD Electronically signed by Rudean Haskell MD Signature Date/Time: 06/14/2020/12:42:02 PM    Final    CT HEAD CODE STROKE WO  CONTRAST`  Result Date: 06/13/2020 CLINICAL DATA:  Code stroke. EXAM: CT HEAD WITHOUT CONTRAST TECHNIQUE: Contiguous axial images were obtained from the base of the skull through the vertex without intravenous contrast. COMPARISON:  CT head 03/24/2019 FINDINGS: Brain: Generalized atrophy most prominent the parietal lobes. Mild ventricular enlargement consistent with atrophy. Small hypodensity right inferior posterior cerebellum consistent with infarct. This was not present previously but is probably nonacute based on density. Negative for acute infarct, hemorrhage, mass. Vascular: Negative for hyperdense vessel. Atherosclerotic calcification in the distal left vertebral artery and basilar artery. Atherosclerotic calcification in the carotid artery bilaterally. Skull: Lytic lesions in the parietal bone bilaterally, stable since 2014. Sinuses/Orbits: Paranasal sinuses clear. Periapical lucency around multiple upper teeth. Negative orbit Other: None ASPECTS ([place] Stroke Program Early CT Score) - Ganglionic level infarction (caudate, lentiform nuclei, internal capsule, insula, M1-M3 cortex): 7 - Supraganglionic infarction (M4-M6 cortex): 3 Total score (0-10 with 10 being normal): 10 IMPRESSION: 1. No acute abnormality.  Generalized atrophy 2. Hypodensity right posterior cerebellum most consistent with chronic infarct, not present in 2020. 3. ASPECTS is 10 4. Code stroke imaging results were communicated on 06/13/2020 at 9:00 am to provider Rory Percy via Shea Evans Electronically Signed   By: Franchot Gallo M.D.   On: 06/13/2020 09:02   VAS [place] LOWER EXTREMITY VENOUS (DVT)  Result Date: 06/14/2020  Lower Venous DVT Study Indications: Pulmonary embolism.  Risk Factors: Confirmed PE. Anticoagulation: Heparin. Limitations: Bandages. Comparison Study: No prior studies. Performing Technologist: Oliver Hum RVT  Examination Guidelines: A complete evaluation includes B-mode imaging, spectral Doppler, color Doppler, and  power Doppler as needed of all accessible portions of each vessel. Bilateral testing is considered an integral part of a  complete examination. Limited examinations for reoccurring indications may be performed as noted. The reflux portion of the exam is performed with the patient in reverse Trendelenburg.  +---------+---------------+---------+-----------+----------+--------------+ RIGHT    CompressibilityPhasicitySpontaneityPropertiesThrombus Aging +---------+---------------+---------+-----------+----------+--------------+ CFV      Full           Yes      Yes                                 +---------+---------------+---------+-----------+----------+--------------+ SFJ      Full                                                        +---------+---------------+---------+-----------+----------+--------------+ FV Prox  Full                                                        +---------+---------------+---------+-----------+----------+--------------+ FV Mid   Full                                                        +---------+---------------+---------+-----------+----------+--------------+ FV DistalFull                                                        +---------+---------------+---------+-----------+----------+--------------+ PFV      Full                                                        +---------+---------------+---------+-----------+----------+--------------+ POP      Full           Yes      Yes                                 +---------+---------------+---------+-----------+----------+--------------+ PTV      Partial                                      Acute          +---------+---------------+---------+-----------+----------+--------------+ PERO     None                                         Acute          +---------+---------------+---------+-----------+----------+--------------+    +---------+---------------+---------+-----------+----------+--------------+ LEFT     CompressibilityPhasicitySpontaneityPropertiesThrombus Aging +---------+---------------+---------+-----------+----------+--------------+ CFV      Full           Yes  Yes                                 +---------+---------------+---------+-----------+----------+--------------+ SFJ      Full                                                        +---------+---------------+---------+-----------+----------+--------------+ FV Prox  Full                                                        +---------+---------------+---------+-----------+----------+--------------+ FV Mid   Full                                                        +---------+---------------+---------+-----------+----------+--------------+ FV DistalFull                                                        +---------+---------------+---------+-----------+----------+--------------+ PFV      Full                                                        +---------+---------------+---------+-----------+----------+--------------+ POP      Full           Yes      Yes                                 +---------+---------------+---------+-----------+----------+--------------+ PTV      Partial                                      Acute          +---------+---------------+---------+-----------+----------+--------------+ PERO     Full                                                        +---------+---------------+---------+-----------+----------+--------------+ Gastroc  None                                         Acute          +---------+---------------+---------+-----------+----------+--------------+     Summary: RIGHT: - Findings consistent with acute deep vein thrombosis involving the right posterior tibial veins, and right peroneal veins. - No cystic structure found in the popliteal fossa.  LEFT: - Findings consistent with acute deep vein thrombosis involving the left gastrocnemius veins, and left posterior tibial veins. - No cystic structure found in the popliteal fossa.  *See table(s) above for measurements and observations. Electronically signed by Harold Barban MD on 06/14/2020 at 8:52:42 PM.    Final    US Abdomen Limited RUQ (LIVER/GB)  Result Date: 06/17/2020 CLINICAL DATA:  Elevated liver enzymes EXAM: ULTRASOUND ABDOMEN LIMITED RIGHT UPPER QUADRANT COMPARISON:  None. FINDINGS: Gallbladder: There is gallbladder sludge without evidence for cholelithiasis or acute cholecystitis. There is no gallbladder wall thickening. The gallbladder is distended. Common bile duct: Diameter: 3 mm Liver: No focal lesion identified. Within normal limits in parenchymal echogenicity. Portal vein is patent on color Doppler imaging with normal direction of blood flow towards the liver. Other: None. IMPRESSION: Gallbladder sludge without evidence for acute cholecystitis. Electronically Signed   By: Constance Holster M.D.   On: 06/17/2020 00:09    Labs:  Basic Metabolic Panel: Recent Labs  Lab 06/23/20 0114 06/24/20 0133 06/25/20 0006  NA 136 135 135  K 4.8 4.9 5.0  CL 101 101 101  CO2 23 24 20*  GLUCOSE 104* 110* 98  BUN 34* 40* 44*  CREATININE 1.16 1.34* 1.56*  CALCIUM 9.2 9.2 9.3  MG 2.2  --   --   PHOS 3.9  --   --     CBC: Recent Labs  Lab 06/23/20 0114 06/24/20 0133 06/25/20 0006  WBC 12.8* 13.6* 12.5*  NEUTROABS 7.9* 8.7* 7.3  HGB 14.9 15.3 15.3  HCT 45.4 44.1 44.7  MCV 92.5 90.7 91.8  PLT 578* 627* 665*    CBG: Recent Labs  Lab 06/24/20 1211 06/24/20 1703 06/24/20 2128 06/25/20 0611 06/25/20 1325  GLUCAP 158* 137* 108* 114* 142*   Family history.  Father with hypertension hyperlipidemia and myocardial infarction.  Sister with hypertension.  Brother with hyperlipidemia.  Denies any colon cancer esophageal cancer or rectal cancer  Brief HPI:   [name] is a  64 y.o. right-handed male with history of CAD CABG recent non-STEMI 10/20 with revascularization question LV thrombus ejection fraction 25% LifeVest was placed at that time.  He lives with spouse mobile home reportedly independent prior to admission.  Presented 06/04/2020 with V. tach arrest in setting of severe ICM and treated with amiodarone lidocaine atropine as well as external pacing and was intubated for airway protection.  He underwent cardiac catheterization showing normal anatomy.  Echocardiogram ejection fraction 25 to 30% with linear filling defect started on intravenous heparin due to concern of thrombus.  He had difficulty with vent weaning developed fever leukocytosis purulent respiratory secretions placed on broad-spectrum antibiotics.  TEE 06/07/2020 showed ejection fraction of 30 to 35% without thrombus PFO or ASD.  Cardiology follow-up question initial plans for ICD placement.  Tolerated extubation nasogastric tube for nutritional support.  On 06/13/2020 he was found to have right upper extremity weakness as well as speech difficulty with confusion code stroke was initiated.  CTA of head and neck negative for LVO or ischemia but showed lobar and segmental pulmonary emboli in upper lungs.  CTA lungs done for follow-up showed bilateral multifocal PE majority acute in addition of subacute chronic thrombus in L LL.  MRI of the brain showed small acute infarct left frontal precentral gyrus and juxtacortical left frontal white matter with mild edema.  Patient was placed on Eliquis.  Cardiology follow-up underwent [place] ICD dual-chamber pacemaker placement 06/19/2020.  Tolerating a regular diet nasogastric tube  removed.  Patient was admitted for a comprehensive rehab program.   Hospital Course: Gunnison Chahal was admitted to rehab 06/25/2020 for inpatient therapies to consist of PT, ST and OT at least three hours five days a week. Past admission physiatrist, therapy team and rehab RN have worked  together to provide customized collaborative inpatient rehab.  Pertaining to patient's acute embolic left side infarct remained stable he would follow neurology services currently maintained on aspirin Plavix and Eliquis he was completing his course of aspirin.  No bleeding episodes.  Mood stabilization with Lexapro.  Recurrent V. tach complicated by cardiogenic shock status post [place] ICD dual-chamber pacemaker placement 06/19/2020 he would follow cardiology services.  No chest pain or shortness of breath.  Bouts of bradycardia cardiology service follow-up and his Toprol was decreased to 12.5 mg.  He did have a history of CAD with CABG 12/15/2012.  Lipitor and Zetia ongoing for hyperlipidemia.  Mildly elevated liver function studies there was some reports of possible alcohol use in the past and monitored.  AKI latest creatinine 1.56.  Prediabetes hemoglobin A1c 5.8 continue to monitor.   Blood pressures were monitored on TID basis and soft and monitored      Rehab course: During patient's stay in rehab weekly team conferences were held to monitor patient's progress, set goals and discuss barriers to discharge. At admission, patient required minimal guard 450 feet rolling walker minimal guard sit to stand minimal guard stand pivot transfers.  Minimal assist lower body bathing supervision upper body bathing minimal assist upper body dressing minimal guard lower body dressing  Physical exam.  Blood pressure 120/80 pulse 61 temperature 97.8 respirations 18 oxygen saturation 95% room air Constitutional.  No acute distress HEENT Head.  Normocephalic and atraumatic Eyes.  Pupils round and reactive to light no discharge without nystagmus Neck.  Supple nontender no JVD without thyromegaly Cardiac regular rate rhythm without extra sounds or murmur heard Abdomen.  Soft nontender positive bowel sounds without rebound Respiratory effort normal no respiratory distress without wheeze Extremities.  No clubbing  cyanosis or edema Neurologic.  Alert oriented follows commands he did have some decrease in safety awareness and sequencing.  No tremors.  Motor strength 5/5 except 4/5 right side handgrip  He/  has had improvement in activity tolerance, balance, postural control as well as ability to compensate for deficits. He/ has had improvement in functional use RUE/LUE  and RLE/LLE as well as improvement in awareness.  Patient showed some flat affect during therapies but cooperative ambulating 250 feet without assistive device he can increase up to 890 feet.  Completes dual cognitive activities including verbal visual sequencing presentations maze testing as well as numerical sequencing.  Ambulates to the bathroom without assistive device supervision contact-guard for overall ADLs bathing dressing grooming.  Speech therapy follow-up focusing on education and cognition.  Patient had cognitive baseline administered cognitive linguistic assessment patient scored within functional limits except for some deficits in memory.  Full family teaching completed plan discharged home.       Disposition: Discharged to home    Diet: Regular  Special Instructions: No driving smoking or alcohol  Medications at discharge 1.  Tylenol as needed 2.  Amiodarone 200 mg twice daily 3.  Eliquis 5 mg twice daily 4.  Aspirin 81 mg daily x2 days and stop 5.  Lipitor 80 mg daily 6.  Plavix 75 mg p.o. daily 7.  Lanoxin 0.125 mg daily 8.  Lexapro 10 mg daily 9.  Zetia 10 mg  daily 10.  Hydralazine 10 mg p.o. every 8 hours 11.  Melatonin 3 mg p.o. nightly 12.  Toprol-XL 12.5 mg p.o. daily 13.  Nitroglycerin as needed  30-35 minutes were spent completing discharge summary and discharge planning  Discharge Instructions     Ambulatory referral to Neurology   Complete by: As directed    An appointment is requested in approximately 4 weeks acute embolic left side infarction   Ambulatory referral to Physical Medicine Rehab    Complete by: As directed    Moderate complexity follow-up 1 to 2 weeks acute embolic left side infarction        Follow-up Information     Jamse Arn, MD Follow up.   Specialty: Physical Medicine and Rehabilitation Why: Office to call for appointment Contact information: [place] [address] [phone]         Sanda Klein, MD Follow up.   Specialty: Cardiology Why: Call for appointment Contact information: [address] [place] [address] [phone]                 Signed: Cathlyn Parsons 06/29/2020, 5:09 AM Patient was seen, face-face, and physical exam performed by me on day of discharge, greater than 30 minutes of total time spent.. Please see progress note from day of discharge as well.  Delice Lesch, MD, ABPMR

## 2020-06-28 NOTE — Progress Notes (Addendum)
[facility] PHYSICAL MEDICINE & REHABILITATION PROGRESS NOTE  Subjective/Complaints: Patient seen sitting up, working with therapy this morning.  He states he slept better overnight.  He slept about his baseline.  Discussed discharge planning with therapies.  ROS: Denies CP, SOB, N/V/D  Objective: Vital Signs: Blood pressure 113/72, pulse (!) 50, temperature 97.7 F (36.5 C), temperature source Oral, resp. rate 17, height 5\' 7"  (1.702 m), weight 82.7 kg, SpO2 96 %. No results found. No results for input(s): WBC, HGB, HCT, PLT in the last 72 hours. No results for input(s): NA, K, CL, CO2, GLUCOSE, BUN, CREATININE, CALCIUM in the last 72 hours.  Intake/Output Summary (Last 24 hours) at 06/28/2020 1014 Last data filed at 06/28/2020 0700 Gross per 24 hour  Intake 312 ml  Output 860 ml  Net -548 ml     Pressure Injury 06/14/20 Buttocks Right Stage 2 -  Partial thickness loss of dermis presenting as a shallow open injury with a red, pink wound bed without slough. (Active)  06/14/20 0800  Location: Buttocks  Location Orientation: Right  Staging: Stage 2 -  Partial thickness loss of dermis presenting as a shallow open injury with a red, pink wound bed without slough.  Wound Description (Comments):   Present on Admission:     Physical Exam: BP 113/72   Pulse (!) 50   Temp 97.7 F (36.5 C) (Oral)   Resp 17   Ht 5\' 7"  (1.702 m)   Wt 82.7 kg   SpO2 96%   BMI 28.56 kg/m  Constitutional: No distress . Vital signs reviewed. HENT: Normocephalic.  Atraumatic. Eyes: EOMI. No discharge. Cardiovascular: No JVD.  RRR. Respiratory: Normal effort.  No stridor.  Bilateral clear to auscultation. GI: Non-distended.  BS +. Skin: Warm and dry.  Intact. Psych: Normal mood.  Normal behavior. Musc: No edema in extremities.  No tenderness in extremities. Neuro: Alert Motor: Grossly 4+-5/5 throughout, stable  Assessment/Plan: 1. Functional deficits which require 3+ hours per day of  interdisciplinary therapy in a comprehensive inpatient rehab setting.  Physiatrist is providing close team supervision and 24 hour management of active medical problems listed below.  Physiatrist and rehab team continue to assess barriers to discharge/monitor patient progress toward functional and medical goals   Care Tool:  Bathing    Body parts bathed by patient: Right arm,Left arm,Chest,Abdomen,Front perineal area,Buttocks,Right upper leg,Left upper leg,Right lower leg,Left lower leg,Face         Bathing assist Assist Level: Supervision/Verbal cueing     Upper Body Dressing/Undressing Upper body dressing   What is the patient wearing?: Pull over shirt    Upper body assist Assist Level: Set up assist    Lower Body Dressing/Undressing Lower body dressing      What is the patient wearing?: Pants,Underwear/pull up     Lower body assist Assist for lower body dressing: Set up assist     Toileting Toileting Toileting Activity did not occur (Clothing management and hygiene only): N/A (no void or bm)  Toileting assist Assist for toileting: Independent with assistive device Assistive Device Comment:  (urinal)   Transfers Chair/bed transfer  Transfers assist     Chair/bed transfer assist level: Supervision/Verbal cueing     Locomotion Ambulation   Ambulation assist      Assist level: Minimal Assistance - Patient > 75% Assistive device: No Device Max distance: 587ft   Walk 10 feet activity   Assist     Assist level: Minimal Assistance - Patient > 75% Assistive device:  No Device   Walk 50 feet activity   Assist    Assist level: Minimal Assistance - Patient > 75% Assistive device: No Device    Walk 150 feet activity   Assist    Assist level: Minimal Assistance - Patient > 75% Assistive device: No Device    Walk 10 feet on uneven surface  activity   Assist Walk 10 feet on uneven surfaces activity did not occur: N/A          Wheelchair     Assist Will patient use wheelchair at discharge?: No             Wheelchair 50 feet with 2 turns activity    Assist            Wheelchair 150 feet activity     Assist           Medical Problem List and Plan: 1. Right side hemiparesis with altered mental status secondary to acute embolic left side infarcts  continue CIR   Team conference today to discuss current and goals and coordination of care, home and environmental barriers, and discharge planning with nursing, case manager, and therapies. Please see conference note from today as well.  2.  Antithrombotics: -DVT/anticoagulation: Bilateral lobar segmental pulmonary emboli.   Eliquis             -antiplatelet therapy: Aspirin 81 mg daily through 12/19, Plavix - plan to cont reg per Neuro 3. Pain Management: Tylenol as needed 4. Mood: Lexapro 10 mg daily             -antipsychotic agents: N/A 5. Neuropsych: This patient is capable of making decisions on his own behalf. 6. Skin/Wound Care: Routine skin checks 7. Fluids/Electrolytes/Nutrition: Routine in and outs. 8.  Recurrent VT complicated by cardiogenic shock.  Status post [place] ICD dual-chamber pacemaker placement 06/19/2020.    Continue amiodarone 200 mg twice daily, Entresto 97-103 mg twice daily, digoxin 0.125 mg daily, and Aldactone 25 mg daily, Toprol 25 mg daily.   Dig level elevated for CHF on 12/13-discussed dosing with pharmacy  Bradycardic on 12/15- will discuss with Cards  Monitor with increased exertion 9.  CAD status post CABG 12/15/2012 with recent inferior STEMI 05/03/2020-06/04/2020.  Continue aspirin therapy.  Follow-up cardiology services 10.  Hyperlipidemia.  Lipitor/Zetia 11.  Decreased nutritional support.  Albumin low at 3.3 on 12/12. Diet has been advanced to regular. Follow-up speech therapy/nutritional management.  13. Elevated liver enzymes.   History of ETOH use as per wife.   ALT mildly elevated on 12/12,  continue to monitor  Continue to monitor.  14.  Leukocytosis  WBCs 12.5 on 12/12, continue monitor  Afebrile 15.  AKI  Creatinine 1.56 on 12/12, labs ordered for tomorrow  Encourage fluids  Continue to monitor 16.  Sleep disturbance  Melatonin started on 12/13, increased on 12/14  At baseline 17.  Prediabetes  Hemoglobin A1c 5.8 on 12/1  Continue to monitor 18.  NYHA class I heart failure  Cardiology notes reviewed from 05/2020.  LOS: 3 days A FACE TO FACE EVALUATION WAS PERFORMED  Evalyse Stroope Lorie Phenix 06/28/2020, 10:14 AM

## 2020-06-28 NOTE — Progress Notes (Addendum)
Physical Therapy Session Note  Patient Details  Name: [name] MRN: [id number] Date of Birth: June 09, 1956  Today's Date: 06/28/2020 PT Individual Time: 1503-1530 PT Individual Time Calculation (min): 27 min   Short Term Goals: Week 1:  PT Short Term Goal 1 (Week 1): STG = LTG due to ELOS  Skilled Therapeutic Interventions/Progress Updates:     Patient in bed upon PT arrival. Patient alert and agreeable to PT session. Patient denied pain during session. Patient with sign for being cleared for performing independent mobility in the room outside of door at beginning of session. Confirmed with RN, who stated this was approved. Reinforced safety education with patient during session.   Therapeutic Activity: Bed Mobility: Patient performed supine to/from sit independently. Patient sat EOB for sensation and strength assessments for d/c, see d/c note. Donned B socks and tennis shoes independently EOB. Transfers: Patient performed sit to/from stand independently from various surfaces throughout session. Patient performed a simulated large truck height car transfer independently using step in technique due to running board needed for access. Provided cues for safe technique.  Gait Training:  Patient ambulated >300 feet x2 without an AD independently. Ambulated with slow gait speed and decreased stride length. Able to walk and talk in busy environment without signs of LOB or decreased safety awareness.   Patient with questions about preventing another stroke in the future. Reviewed education materials in [employer] notebook including nutritional changes, increased physical activity as cleared by his MD, daily BP monitoring and safe values (educate don following up with MD on safe/normal values for him specifically), and educated on signs and symptoms of stroke using the BE FAST acronym with handout.   Educated patient on fall risk/prevention, home modifications to prevent falls, and activation of  emergency services in the event of a fall during session.   Patient in bed at end of session with breaks locked, bed alarm off due to being independent in the room, and all needs within reach.    Therapy Documentation Precautions:  Precautions Precautions: Fall,ICD/Pacemaker Restrictions Weight Bearing Restrictions: No LUE Weight Bearing: Weight bearing as tolerated Other Position/Activity Restrictions: 5-10lb weight limit in Left arm, okay to lift arm overhead. Avoid extreme external rotation/abduction   Therapy/Group: Individual Therapy  Keven Osborn L Xara Paulding PT, DPT  06/28/2020, 4:55 PM

## 2020-06-28 NOTE — Progress Notes (Signed)
Occupational Therapy Discharge Summary  Patient Details  Name: [name] MRN: [id number] Date of Birth: 1956/05/08  Today's Date: 06/28/2020 OT Individual Time: 9563-8756 OT Individual Time Calculation (min): 24 min    Patient has met 8 of 9 long term goals due to improved activity tolerance, improved balance, postural control, improved awareness and improved coordination.  Patient to discharge at overall Independent level with occasional Supervision for tasks such as tub/shower transfers.  Patient's care partner is independent to provide the necessary physical assistance at discharge.  Pt is Independent without any AD for BADLs such as toileting, bathing, and dressing but recommended wife be present to provide Supervision as needed if the pt feels he needs it for safety. Note spoke with staff for most recent ADL skill performance.   Reasons goals not met: Pt is overall Independent but did need Supervision for safety with tub/shower transfers stepping over tub wall.   Recommendation:  NA, no follow up  Equipment: No equipment provided However pt is aware of where to purchase shower seat if he later feels as if he needs one.  Reasons for discharge: treatment goals met and discharge from hospital  Patient/family agrees with progress made and goals achieved: Yes   Skilled Interventions: Pt greeted at time of session reclined in bed resting on phone, agreeable to OT session but declining shower since he had once a few days ago and wants to wait until he gets home. Pt made Indep in room today. Supine to sit Indep, ambulating transfers around room, bathroom, and later functional mobility throughout hall all with Independence except occasional Supervision for stand pivot transfers. Indep with mobility to/from bathroom as well as simulated bathing activity and toilet transfers. Pt educated on where to purchase shower seat if he later feels like he needs one and to have wife supervise if he  feels like it is needed. Donned shoes and simulated LB dressing activity with independence. Back in room in recliner call bell in reach.   OT Discharge Precautions/Restrictions  Precautions Precautions: Fall;ICD/Pacemaker Restrictions Weight Bearing Restrictions: No LUE Weight Bearing: Weight bearing as tolerated Other Position/Activity Restrictions: 5-10lb weight limit in Left arm, okay to lift arm overhead. Avoid extreme external rotation/abduction Pain Pain Assessment Pain Scale: 0-10 Pain Score: 0-No pain ADL ADL Grooming: Modified independent Where Assessed-Grooming: Standing at sink Upper Body Bathing: Modified independent Where Assessed-Upper Body Bathing: Shower Lower Body Bathing: Modified independent (per staff report) Where Assessed-Lower Body Bathing: Shower Upper Body Dressing: Independent Where Assessed-Upper Body Dressing: Chair Lower Body Dressing: Independent Where Assessed-Lower Body Dressing: Chair Toileting: Independent (per staff report) Toilet Transfer: Independent Toilet Transfer Method: Ambulating Tub/Shower Transfer: Close supervison (per staff report) Tub/Shower Transfer Method: Optometrist: Emergency planning/management officer Transfer: IT consultant Method: Heritage manager: Grab bars Vision Baseline Vision/History: Wears glasses Wears Glasses: Reading only Patient Visual Report: No change from baseline Vision Assessment?: No apparent visual deficits Perception  Perception: Within Functional Limits Praxis Praxis: Intact Cognition Overall Cognitive Status: History of cognitive impairments - at baseline Arousal/Alertness: Awake/alert Orientation Level: Oriented X4 Focused Attention: Appears intact Memory: Impaired Awareness: Appears intact Problem Solving: Appears intact Safety/Judgment: Appears intact Sensation Sensation Light Touch: Appears Intact Proprioception: Appears  Intact Coordination Gross Motor Movements are Fluid and Coordinated: Yes Fine Motor Movements are Fluid and Coordinated: Yes Finger Nose Finger Test: [facility] Heel Shin Test: [facility] Motor  Motor Motor: Within Functional Limits Motor - Skilled Clinical Observations: Mild delayed initiation Motor - Discharge Observations:  WFL for tasks assessed Mobility  Bed Mobility Bed Mobility: Rolling Right;Rolling Left;Supine to Sit;Sit to Supine Rolling Right: Independent Rolling Left: Independent Supine to Sit: Independent Sit to Supine: Independent Transfers Sit to Stand: Independent Stand to Sit: Independent  Trunk/Postural Assessment  Cervical Assessment Cervical Assessment: Within Functional Limits Thoracic Assessment Thoracic Assessment: Exceptions to [facility] Lumbar Assessment Lumbar Assessment: Exceptions to [facility] Postural Control Postural Control: Within Functional Limits Righting Reactions: delayed and inadequate Protective Responses: delayed and inadequate  Balance Balance Balance Assessed: Yes Standardized Balance Assessment Standardized Balance Assessment: Berg Balance Test;Dynamic Gait Index Berg Balance Test Sit to Stand: Able to stand without using hands and stabilize independently Standing Unsupported: Able to stand 2 minutes with supervision Sitting with Back Unsupported but Feet Supported on Floor or Stool: Able to sit safely and securely 2 minutes Stand to Sit: Sits safely with minimal use of hands Transfers: Able to transfer safely, minor use of hands Standing Unsupported with Eyes Closed: Able to stand 10 seconds with supervision Standing Ubsupported with Feet Together: Able to place feet together independently and stand for 1 minute with supervision From Standing, Reach Forward with Outstretched Arm: Can reach confidently >25 cm (10") From Standing Position, Pick up Object from Floor: Able to pick up shoe safely and easily From Standing Position, Turn to Look Behind Over  each Shoulder: Looks behind from both sides and weight shifts well Turn 360 Degrees: Able to turn 360 degrees safely in 4 seconds or less Standing Unsupported, Alternately Place Feet on Step/Stool: Able to stand independently and safely and complete 8 steps in 20 seconds Standing Unsupported, One Foot in Front: Able to place foot tandem independently and hold 30 seconds Standing on One Leg: Able to lift leg independently and hold 5-10 seconds Total Score: 52 Dynamic Gait Index Level Surface: Mild Impairment Change in Gait Speed: Moderate Impairment Gait with Horizontal Head Turns: Mild Impairment Gait with Vertical Head Turns: Mild Impairment Gait and Pivot Turn: Moderate Impairment Step Over Obstacle: Mild Impairment Step Around Obstacles: Mild Impairment Steps: Mild Impairment Total Score: 14 Extremity/Trunk Assessment RUE Assessment RUE Assessment: Within Functional Limits LUE Assessment LUE Assessment: Within Functional Limits General Strength Comments: did not perform MMT d/t pacemaker, but LUE WFL for tasks assessed   Viona Gilmore 06/28/2020, 4:33 PM

## 2020-06-28 NOTE — Progress Notes (Signed)
Physical Therapy Discharge Summary  Patient Details  Name: [name] MRN: [id number] Date of Birth: 02-24-1956  Today's Date: 06/28/2020   Patient has met 9 of 9 long term goals due to improved activity tolerance, improved balance, improved postural control, increased strength, ability to compensate for deficits, improved attention, improved awareness and improved coordination.  Patient to discharge at an ambulatory level Supervision.   Patient's care partner available 24/7 to provide the necessary supervision assistance at discharge.  Reasons goals not met: n/a  Recommendation:  Patient will benefit from ongoing skilled PT services in outpatient setting to continue to advance safe functional mobility, address ongoing impairments in dynamic balance, activity tolerance, community integration, lifestyle changes, patient/caregiver education, and minimize fall risk.  Equipment: No equipment provided  Reasons for discharge: treatment goals met  Patient/family agrees with progress made and goals achieved: Yes  PT Discharge Precautions/Restrictions Precautions Precautions: Fall;ICD/Pacemaker Restrictions Weight Bearing Restrictions: No Other Position/Activity Restrictions: 5-10lb weight limit in Left arm, okay to lift arm overhead. Avoid extreme external rotation/abduction Vision/Perception  Perception Perception: Within Functional Limits Praxis Praxis: Intact  Cognition Overall Cognitive Status: History of cognitive impairments - at baseline Arousal/Alertness: Awake/alert Orientation Level: Oriented X4 Focused Attention: Appears intact Memory: Impaired Awareness: Appears intact Problem Solving: Appears intact Safety/Judgment: Appears intact Sensation Sensation Light Touch: Appears Intact Proprioception: Appears Intact Coordination Gross Motor Movements are Fluid and Coordinated: Yes Fine Motor Movements are Fluid and Coordinated: Yes Finger Nose Finger Test:  [facility] Heel Shin Test: [facility] Motor  Motor Motor: Within Functional Limits Motor - Skilled Clinical Observations: Mild delayed initiation Motor - Discharge Observations: WFL for tasks assessed  Mobility Bed Mobility Bed Mobility: Rolling Right;Rolling Left;Supine to Sit;Sit to Supine Rolling Right: Independent Rolling Left: Independent Supine to Sit: Independent Sit to Supine: Independent Transfers Transfers: Sit to Stand;Stand to Sit;Stand Pivot Transfers Sit to Stand: Independent Stand to Sit: Independent Stand Pivot Transfers: Independent Stand Pivot Transfer Details: Verbal cues for precautions/safety Transfer (Assistive device): None Locomotion  Gait Ambulation: Yes Gait Assistance: Supervision/Verbal cueing Gait Distance (Feet): 891 Feet Assistive device: None Gait Assistance Details: Verbal cues for precautions/safety Gait Gait: Yes Gait Pattern: Impaired Gait Pattern: Decreased step length - right;Decreased step length - left;Decreased stride length;Poor foot clearance - right;Poor foot clearance - left;Wide base of support Stairs / Additional Locomotion Stairs: Yes Stairs Assistance: Supervision/Verbal cueing Stair Management Technique: Two rails Number of Stairs: 12 Height of Stairs: 6 Ramp: Supervision/Verbal cueing Curb: Supervision/Verbal cueing Wheelchair Mobility Wheelchair Mobility: No  Trunk/Postural Assessment  Cervical Assessment Cervical Assessment: Within Functional Limits Thoracic Assessment Thoracic Assessment: Exceptions to [facility] (kyphotic) Lumbar Assessment Lumbar Assessment: Exceptions to [facility] (posterior pelvic tilt) Postural Control Postural Control: Deficits on evaluation Righting Reactions: delayed and inadequate Protective Responses: delayed and inadequate  Balance Balance Balance Assessed: Yes Standardized Balance Assessment Standardized Balance Assessment: Berg Balance Test;Dynamic Gait Index Berg Balance Test Sit to Stand: Able to  stand without using hands and stabilize independently Standing Unsupported: Able to stand 2 minutes with supervision Sitting with Back Unsupported but Feet Supported on Floor or Stool: Able to sit safely and securely 2 minutes Stand to Sit: Sits safely with minimal use of hands Transfers: Able to transfer safely, minor use of hands Standing Unsupported with Eyes Closed: Able to stand 10 seconds with supervision Standing Ubsupported with Feet Together: Able to place feet together independently and stand for 1 minute with supervision From Standing, Reach Forward with Outstretched Arm: Can reach confidently >25 cm (10") From Standing Position,  Pick up Object from Floor: Able to pick up shoe safely and easily From Standing Position, Turn to Look Behind Over each Shoulder: Looks behind from both sides and weight shifts well Turn 360 Degrees: Able to turn 360 degrees safely in 4 seconds or less Standing Unsupported, Alternately Place Feet on Step/Stool: Able to stand independently and safely and complete 8 steps in 20 seconds Standing Unsupported, One Foot in Front: Able to place foot tandem independently and hold 30 seconds Standing on One Leg: Able to lift leg independently and hold 5-10 seconds Total Score: 52 Dynamic Gait Index Level Surface: Mild Impairment Change in Gait Speed: Moderate Impairment Gait with Horizontal Head Turns: Mild Impairment Gait with Vertical Head Turns: Mild Impairment Gait and Pivot Turn: Moderate Impairment Step Over Obstacle: Mild Impairment Step Around Obstacles: Mild Impairment Steps: Mild Impairment Total Score: 14 Extremity Assessment  RLE Assessment RLE Assessment: Within Functional Limits General Strength Comments: Grossly 5/5 throughout in sitting LLE Assessment LLE Assessment: Within Functional Limits General Strength Comments: Grossly 5/5 throughout in sitting, reports weakness of L LE at admission, but feels that it has resolved at this  time    Debbora Dus PT, DPT  Apolinar Junes PT, DPT  06/28/2020, 5:06 PM

## 2020-06-28 NOTE — Progress Notes (Signed)
Physical Therapy Session Note  Patient Details  Name: [name] MRN: [id number] Date of Birth: 10-21-1955  Today's Date: 06/28/2020 PT Individual Time: 0800-0900 PT Individual Time Calculation (min): 60 min   Short Term Goals: Week 1:  PT Short Term Goal 1 (Week 1): STG = LTG due to ELOS  Skilled Therapeutic Interventions/Progress Updates:    Patient received supine in bed, agreeable to PT. He denies pain. Patient able to ambulate from room to therapy gym, ~176ft with no AD and supervision. Minor Trendelenburg noted and intermittent LOB posterior L when turning, but patient able to correct himself without intervention. Patient scored a 52/56 on the Berg balance scale indicating minimal risk for falling. However, PT discussed with patient that given rx and medical Hx, patient remains a fall risk. He verbalized understanding. Patient scored a 14/24 on the DGI indicating an increased risk for falling. Patient with greatest impairment with horizontal head turns and turn & stop task. Patient completing 5xSTS with no UE/AD use in 13.7s, which is around age-predicted norm. He was able to ambulate 862ft during 6MWT, which is less than age predicted norm, suggesting poor endurance for community ambulation. PT discussed findings of all outcome measures with patient and he verbalized understanding. He completed the BITS visual scanning task requiring ModA for accuracy of finding all of the bells in the pictures. He also required ModA for accuracy when completing trail making task on BITS. Patient completing 7 mins on NuStep for further B LE endurance and reciprocal stepping pattern. Patient ambulating back to his room with supervision and no AD. He was able to transfer into bathroom with supervision and hand off to NT for safety.    Therapy Documentation Precautions:  Precautions Precautions: Fall,ICD/Pacemaker Restrictions Weight Bearing Restrictions: No LUE Weight Bearing: Weight bearing as  tolerated Other Position/Activity Restrictions: 5-10lb weight limit in Left arm, okay to lift arm overhead. Avoid extreme external rotation/abduction   Therapy/Group: Individual Therapy  Karoline Caldwell, PT, DPT, CBIS  06/28/2020, 7:42 AM

## 2020-06-28 NOTE — Patient Care Conference (Signed)
Inpatient RehabilitationTeam Conference and Plan of Care Update Date: 06/28/2020   Time: 11:08 AM    Patient Name: [name]      Medical Record Number: [id number]  Date of Birth: [birth year] Sex: Male         Room/Bed: 4W03C/4W03C-01 Payor Info: Payor: [employer] MEDICARE / Plan: [place] HMO / Product Type: *No Product type* /    Admit Date/Time:  06/25/2020  4:41 PM  Primary Diagnosis:  Cerebral infarction [facility])  Hospital Problems: Principal Problem:   Cerebral infarction [facility]) Active Problems:   Status post CVA   Debility   AKI (acute kidney injury) ([place])   Leukocytosis   Right hemiparesis (HCC)   Recurrent ventricular tachycardia (HCC)   Transaminitis   Sleep disturbance   NYHA class 1 heart failure with reduced ejection fraction ([place])   Bradycardia    Expected Discharge Date: Expected Discharge Date: 06/29/20  Team Members Present: Physician leading conference: Dr. Delice Lesch Care Coodinator Present: Dorien Chihuahua, RN, BSN, CRRN;Becky Dupree, LCSW Nurse Present: Other (comment) Tommie Sams, RN) PT Present: Barrie Folk, PT OT Present: Meriel Pica, OT PPS Coordinator present : Gunnar Fusi, Novella Olive, PT     Current Status/Progress Goal Weekly Team Focus  Bowel/Bladder   Pt continent of B/B LBM 06/26/2020  Remain continent  Assess B/B every shift and PRN   Swallow/Nutrition/ Hydration             ADL's   set up to supervision only for all BADLs without AD, stands to shower  mod I with BADLs except set up for tub transfer and bathing  ADL training, balance, pt/family education   Mobility   CGA transfers, CGA/minA gait with no AD, stairs CGA with 2 hand rails  mod I  higher level gait and balance training, DC planning. Patient wants to go home sooner rather than later.   Communication             Safety/Cognition/ Behavioral Observations  Mod I  Mod I - baseline deficits and education complete      Pain   Consistently denies pain   pain scale <3/10  Assess pain every shift and PRN   Skin   Surgical incision to left chest healing. CDI  No now breakdown  Assess skin every shift and PRN     Discharge Planning:  Home with wife who is able to assist if needed. Pt doing well and making good progress here.   Team Discussion: Overall doing very well. MD monitoring AKI and adjusted medications for insomnia. Patient does not need equipment for discharge  Patient on target to meet rehab goals: yes, currently supervision to mod I  *See Care Plan and progress notes for long and short-term goals.   Revisions to Treatment Plan:    Teaching Needs: Medications, secondary stroke risks, etc.  Current Barriers to Discharge: None  Possible Resolutions to Barriers:  Recommend OP follow up to address balance issues etc.     Medical Summary Current Status: Right side hemiparesis with altered mental status secondary to acute embolic left side infarcts  Barriers to Discharge: Medical stability   Possible Resolutions to Barriers/Weekly Focus: Therapies, Cards consult for bradycardia, follow labs - Cr, sleep at baseline   Continued Need for Acute Rehabilitation Level of Care: The patient requires daily medical management by a physician with specialized training in physical medicine and rehabilitation for the following reasons: Direction of a multidisciplinary physical rehabilitation program to maximize functional independence : Yes Medical  management of patient stability for increased activity during participation in an intensive rehabilitation regime.: Yes Analysis of laboratory values and/or radiology reports with any subsequent need for medication adjustment and/or medical intervention. : Yes   I attest that I was present, lead the team conference, and concur with the assessment and plan of the team.   Dorien Chihuahua B 06/28/2020, 1:48 PM

## 2020-06-29 ENCOUNTER — Encounter: Payer: Medicare HMO | Admitting: Physician Assistant

## 2020-06-29 LAB — BASIC METABOLIC PANEL
Anion gap: 9 (ref 5–15)
BUN: 46 mg/dL — ABNORMAL HIGH (ref 8–23)
CO2: 26 mmol/L (ref 22–32)
Calcium: 9.1 mg/dL (ref 8.9–10.3)
Chloride: 101 mmol/L (ref 98–111)
Creatinine, Ser: 1.7 mg/dL — ABNORMAL HIGH (ref 0.61–1.24)
GFR, Estimated: 44 mL/min — ABNORMAL LOW (ref >60)
Glucose, Bld: 99 mg/dL (ref 70–99)
Potassium: 4.5 mmol/L (ref 3.5–5.1)
Sodium: 136 mmol/L (ref 135–145)

## 2020-06-29 MED ORDER — DIGOXIN 125 MCG PO TABS: 0.1250 mg | ORAL_TABLET | Freq: Every day | ORAL | 0 refills | Status: DC

## 2020-06-29 MED ORDER — AMIODARONE HCL 200 MG PO TABS: 200.0000 mg | ORAL_TABLET | Freq: Two times a day (BID) | ORAL | 0 refills | Status: DC

## 2020-06-29 MED ORDER — MELATONIN 3 MG PO TABS: 3.0000 mg | ORAL_TABLET | Freq: Every day | ORAL | 0 refills | Status: DC

## 2020-06-29 MED ORDER — HYDRALAZINE HCL 10 MG PO TABS: 10.0000 mg | ORAL_TABLET | Freq: Three times a day (TID) | ORAL | 0 refills | Status: DC

## 2020-06-29 MED ORDER — EZETIMIBE 10 MG PO TABS: 10.0000 mg | ORAL_TABLET | Freq: Every day | ORAL | 3 refills | Status: DC

## 2020-06-29 MED ORDER — METOPROLOL SUCCINATE ER 25 MG PO TB24: 12.5000 mg | ORAL_TABLET | Freq: Every day | ORAL | 0 refills | Status: DC

## 2020-06-29 MED ORDER — ATORVASTATIN CALCIUM 80 MG PO TABS: ORAL_TABLET | ORAL | 7 refills | Status: DC

## 2020-06-29 MED ORDER — NITROGLYCERIN 0.4 MG SL SUBL: 0.4000 mg | SUBLINGUAL_TABLET | SUBLINGUAL | 2 refills | Status: AC | PRN

## 2020-06-29 MED ORDER — APIXABAN 5 MG PO TABS: 5.0000 mg | ORAL_TABLET | Freq: Two times a day (BID) | ORAL | 0 refills | Status: DC

## 2020-06-29 MED ORDER — CLOPIDOGREL BISULFATE 75 MG PO TABS: 75.0000 mg | ORAL_TABLET | Freq: Every day | ORAL | 3 refills | Status: DC

## 2020-06-29 MED ORDER — ACETAMINOPHEN 325 MG PO TABS: 650.0000 mg | ORAL_TABLET | ORAL | Status: DC | PRN

## 2020-06-29 MED ORDER — ESCITALOPRAM OXALATE 10 MG PO TABS: 10.0000 mg | ORAL_TABLET | Freq: Every day | ORAL | 11 refills | Status: DC

## 2020-06-29 MED FILL — METOPROLOL SUCCINATE ER 25: 25 | 30 days supply | Qty: 15 | Fill #0

## 2020-06-29 MED FILL — ATORVASTATIN CALCIUM 80 MG: 80 | 30 days supply | Qty: 30 | Fill #0

## 2020-06-29 MED FILL — CLOPIDOGREL 75 MG TABLET: 75 | 30 days supply | Qty: 30 | Fill #0

## 2020-06-29 MED FILL — DIGOXIN 0.125 MG TABLET: 125 | 30 days supply | Qty: 30 | Fill #0

## 2020-06-29 MED FILL — hydrALAZINE HCL 10 MG TABS: 10 | 30 days supply | Qty: 90 | Fill #0

## 2020-06-29 MED FILL — EZETIMIBE 10 MG TABS: 10 | 30 days supply | Qty: 30 | Fill #0

## 2020-06-29 MED FILL — ESCITALOPRAM 10 MG TABLET: 10 | 30 days supply | Qty: 30 | Fill #0

## 2020-06-29 MED FILL — ELIQUIS 5 MG TABLET: 5 | 30 days supply | Qty: 60 | Fill #0

## 2020-06-29 MED FILL — MELATONIN 3 MG TABS: 3 | 30 days supply | Qty: 30 | Fill #0

## 2020-06-29 MED FILL — AMIODARONE HCL 200 MG TAB: 200 | 30 days supply | Qty: 60 | Fill #0

## 2020-06-29 MED FILL — NITROGLYCERIN 0.4 MG TAB SL: 0.4 | 8 days supply | Qty: 25 | Fill #0

## 2020-06-29 NOTE — Progress Notes (Signed)
Family (pt brother) here to pu p/t, pt has d/c instructions, called transitional care for pt medications.

## 2020-06-29 NOTE — Progress Notes (Signed)
[place] PHYSICAL MEDICINE & REHABILITATION PROGRESS NOTE  Subjective/Complaints: Patient seen sitting up in a chair this morning.  He states he slept well overnight.  He states he is ready for discharge.  ROS: Denies CP, SOB, N/V/D  Objective: Vital Signs: Blood pressure 101/65, pulse (!) 52, temperature (!) 97.5 F (36.4 C), resp. rate 18, height 5\' 7"  (1.702 m), weight 83.2 kg, SpO2 96 %. No results found. No results for input(s): WBC, HGB, HCT, PLT in the last 72 hours. Recent Labs    06/29/20 0458  NA 136  K 4.5  CL 101  CO2 26  GLUCOSE 99  BUN 46*  CREATININE 1.70*  CALCIUM 9.1    Intake/Output Summary (Last 24 hours) at 06/29/2020 1225 Last data filed at 06/29/2020 0900 Gross per 24 hour  Intake 480 ml  Output 300 ml  Net 180 ml        Physical Exam: BP 101/65 (BP Location: Right Arm)    Pulse (!) 52    Temp (!) 97.5 F (36.4 C)    Resp 18    Ht 5\' 7"  (1.702 m)    Wt 83.2 kg    SpO2 96%    BMI 28.73 kg/m  Constitutional: No distress . Vital signs reviewed. HENT: Normocephalic.  Atraumatic. Eyes: EOMI. No discharge. Cardiovascular: No JVD.  Bradycardia.Marland Kitchen Respiratory: Normal effort.  No stridor.  Bilateral clear to auscultation. GI: Non-distended.  BS +. Skin: Warm and dry.  Intact. Psych: Normal mood.  Normal behavior. Musc: No edema in extremities.  No tenderness in extremities. Neuro: Alert Motor: Grossly 4+-5/5 throughout, unchanged  Assessment/Plan: 1. Functional deficits which require 3+ hours per day of interdisciplinary therapy in a comprehensive inpatient rehab setting.  Physiatrist is providing close team supervision and 24 hour management of active medical problems listed below.  Physiatrist and rehab team continue to assess barriers to discharge/monitor patient progress toward functional and medical goals   Care Tool:  Bathing    Body parts bathed by patient: Right arm,Left arm,Chest,Abdomen,Front perineal area,Buttocks,Right upper  leg,Left upper leg,Right lower leg,Left lower leg,Face         Bathing assist Assist Level: Independent (per staff report)     Upper Body Dressing/Undressing Upper body dressing   What is the patient wearing?: Pull over shirt    Upper body assist Assist Level: Independent (per staff report)    Lower Body Dressing/Undressing Lower body dressing      What is the patient wearing?: Pants,Underwear/pull up     Lower body assist Assist for lower body dressing: Independent (per staff report)     Toileting Toileting Toileting Activity did not occur (Clothing management and hygiene only): N/A (no void or bm)  Toileting assist Assist for toileting: Independent with assistive device (per staff report) Assistive Device Comment:  (urinal)   Transfers Chair/bed transfer  Transfers assist     Chair/bed transfer assist level: Independent     Locomotion Ambulation   Ambulation assist      Assist level: Independent Assistive device: No Device Max distance: >300 ft   Walk 10 feet activity   Assist     Assist level: Independent Assistive device: No Device   Walk 50 feet activity   Assist    Assist level: Independent Assistive device: No Device    Walk 150 feet activity   Assist    Assist level: Independent Assistive device: No Device    Walk 10 feet on uneven surface  activity   Assist  Assist level: Supervision/Verbal cueing     Wheelchair     Assist Will patient use wheelchair at discharge?: No   Wheelchair activity did not occur: N/A         Wheelchair 50 feet with 2 turns activity    Assist    Wheelchair 50 feet with 2 turns activity did not occur: N/A       Wheelchair 150 feet activity     Assist  Wheelchair 150 feet activity did not occur: N/A        Medical Problem List and Plan: 1. Right side hemiparesis with altered mental status secondary to acute embolic left side infarcts  DC today  Will see  patient for transitional care management in 1-2 weeks post-discharge 2.  Antithrombotics: -DVT/anticoagulation: Bilateral lobar segmental pulmonary emboli.   Eliquis             -antiplatelet therapy: Aspirin 81 mg daily through 12/19, Plavix - plan to cont reg per Neuro 3. Pain Management: Tylenol as needed 4. Mood: Lexapro 10 mg daily             -antipsychotic agents: N/A 5. Neuropsych: This patient is capable of making decisions on his own behalf. 6. Skin/Wound Care: Routine skin checks 7. Fluids/Electrolytes/Nutrition: Routine in and outs. 8.  Recurrent VT complicated by cardiogenic shock.  Status post [employer] ICD dual-chamber pacemaker placement 06/19/2020.    Continue amiodarone 200 mg twice daily, Entresto 97-103 mg twice daily, digoxin 0.125 mg daily, and Aldactone 25 mg daily, Toprol decreased to 12.5.   Dig level elevated for CHF on 12/13-discussed dosing with pharmacy  Bradycardic-Per cardiology, notes reviewed-metoprolol decreased, continue other medications  Monitor with increased exertion 9.  CAD status post CABG 12/15/2012 with recent inferior STEMI 05/03/2020-06/04/2020.  Continue aspirin therapy.  Follow-up cardiology services 10.  Hyperlipidemia.  Lipitor/Zetia 11.  Decreased nutritional support.  Albumin low at 3.3 on 12/12. Diet has been advanced to regular. Follow-up speech therapy/nutritional management.  13. Elevated liver enzymes.   History of ETOH use as per wife.   ALT mildly elevated on 12/12, continue to monitor  Continue to monitor.  14.  Leukocytosis  WBCs 12.5 on 12/12, continue monitor  Afebrile 15.  AKI  Creatinine 1.70 on 12/16, continue to monitor as outpatient  Encourage fluids  Continue to monitor 16.  Sleep disturbance  Melatonin started on 12/13, increased on 12/14  At baseline 17.  Prediabetes  Hemoglobin A1c 5.8 on 12/1  Continue to monitor 18.  NYHA class I heart failure  Cardiology notes reviewed from 05/2020.  > 30 minutes spent in  total in discharge planning between myself and PA regarding aforementioned, as well discussion regarding DME equipment, follow-up appointments, follow-up therapies, discharge medications, discharge recommendations, discussing with cardiology, etc.  LOS: 4 days A FACE TO FACE EVALUATION WAS PERFORMED  [name] Lorie Phenix 06/29/2020, 12:25 PM

## 2020-06-30 LAB — CULTURE, BLOOD (ROUTINE X 2)
Culture: NO GROWTH
Culture: NO GROWTH

## 2020-07-03 ENCOUNTER — Other Ambulatory Visit: Payer: Self-pay

## 2020-07-03 ENCOUNTER — Ambulatory Visit: Payer: Medicare HMO | Admitting: Student

## 2020-07-03 DIAGNOSIS — I5042 Chronic combined systolic (congestive) and diastolic (congestive) heart failure: Secondary | ICD-10-CM | POA: Diagnosis not present

## 2020-07-03 DIAGNOSIS — I4729 Other ventricular tachycardia: Secondary | ICD-10-CM

## 2020-07-03 DIAGNOSIS — I472 Ventricular tachycardia: Secondary | ICD-10-CM

## 2020-07-03 LAB — CUP PACEART INCLINIC DEVICE CHECK
Battery Remaining Longevity: 98 mo
Brady Statistic RA Percent Paced: 35 %
Brady Statistic RV Percent Paced: 0.66 %
Date Time Interrogation Session: 20211220143635
HighPow Impedance: 60.75 Ohm
Implantable Lead Implant Date: 20211206
Implantable Lead Implant Date: 20211206
Implantable Lead Location: 753859
Implantable Lead Location: 753860
Implantable Pulse Generator Implant Date: 20211206
Lead Channel Impedance Value: 512.5 Ohm
Lead Channel Impedance Value: 512.5 Ohm
Lead Channel Pacing Threshold Amplitude: 0.75 V
Lead Channel Pacing Threshold Amplitude: 0.75 V
Lead Channel Pacing Threshold Amplitude: 1 V
Lead Channel Pacing Threshold Amplitude: 1 V
Lead Channel Pacing Threshold Pulse Width: 0.5 ms
Lead Channel Pacing Threshold Pulse Width: 0.5 ms
Lead Channel Pacing Threshold Pulse Width: 0.5 ms
Lead Channel Pacing Threshold Pulse Width: 0.5 ms
Lead Channel Sensing Intrinsic Amplitude: 2.3 mV
Lead Channel Sensing Intrinsic Amplitude: 8.6 mV
Lead Channel Setting Pacing Amplitude: 3.5 V
Lead Channel Setting Pacing Amplitude: 3.5 V
Lead Channel Setting Pacing Pulse Width: 0.5 ms
Lead Channel Setting Sensing Sensitivity: 0.5 mV
Pulse Gen Serial Number: 810012347

## 2020-07-03 NOTE — Progress Notes (Signed)

## 2020-07-03 NOTE — Patient Instructions (Addendum)
Medication Instructions:  *If you need a refill on your cardiac medications before your next appointment, please call your pharmacy*  Follow-Up: At [facility], you and your health needs are our priority.  As part of our continuing mission to provide you with exceptional heart care, we have created designated Provider Care Teams.  These Care Teams include your primary Cardiologist (physician) and Advanced Practice Providers (APPs -  Physician Assistants and Nurse Practitioners) who all work together to provide you with the care you need, when you need it.  We recommend signing up for the patient portal called "MyChart".  Sign up information is provided on this After Visit Summary.  MyChart is used to connect with patients for Virtual Visits (Telemedicine).  Patients are able to view lab/test results, encounter notes, upcoming appointments, etc.  Non-urgent messages can be sent to your provider as well.   To learn more about what you can do with MyChart, go to [email or web address].    Your next appointment:   Your physician recommends that you keep your scheduled follow-up appointment on 09/19/20 with Dr. Quentin Ore  Remote monitoring is used to monitor your ICD from home. This monitoring reduces the number of office visits required to check your device to one time per year. It allows [place] to keep an eye on the functioning of your device to ensure it is working properly. You are scheduled for a device check from home on 09/20/20. You may send your transmission at any time that day. If you have a wireless device, the transmission will be sent automatically. After your physician reviews your transmission, you will receive a postcard with your next transmission date.  The format for your next appointment:   In Person with Lars Mage, MD

## 2020-07-06 DIAGNOSIS — I213 ST elevation (STEMI) myocardial infarction of unspecified site: Secondary | ICD-10-CM | POA: Diagnosis not present

## 2020-07-06 DIAGNOSIS — I420 Dilated cardiomyopathy: Secondary | ICD-10-CM | POA: Diagnosis not present

## 2020-07-10 DIAGNOSIS — I10 Essential (primary) hypertension: Secondary | ICD-10-CM | POA: Diagnosis not present

## 2020-07-10 DIAGNOSIS — I69318 Other symptoms and signs involving cognitive functions following cerebral infarction: Secondary | ICD-10-CM | POA: Diagnosis not present

## 2020-07-10 DIAGNOSIS — Z95810 Presence of automatic (implantable) cardiac defibrillator: Secondary | ICD-10-CM | POA: Diagnosis not present

## 2020-07-10 DIAGNOSIS — I2510 Atherosclerotic heart disease of native coronary artery without angina pectoris: Secondary | ICD-10-CM | POA: Diagnosis not present

## 2020-07-10 DIAGNOSIS — I252 Old myocardial infarction: Secondary | ICD-10-CM | POA: Diagnosis not present

## 2020-07-10 DIAGNOSIS — Z87891 Personal history of nicotine dependence: Secondary | ICD-10-CM | POA: Diagnosis not present

## 2020-07-10 DIAGNOSIS — E785 Hyperlipidemia, unspecified: Secondary | ICD-10-CM | POA: Diagnosis not present

## 2020-07-10 DIAGNOSIS — Z951 Presence of aortocoronary bypass graft: Secondary | ICD-10-CM | POA: Diagnosis not present

## 2020-07-10 DIAGNOSIS — I69351 Hemiplegia and hemiparesis following cerebral infarction affecting right dominant side: Secondary | ICD-10-CM | POA: Diagnosis not present

## 2020-07-12 ENCOUNTER — Telehealth: Payer: Self-pay | Admitting: *Deleted

## 2020-07-12 NOTE — Telephone Encounter (Signed)
Mark PT [facility] called for PT POC 1wk1 2wk3 1wk2 beginning 07/10/20.  Approval given.

## 2020-07-17 ENCOUNTER — Encounter: Payer: Self-pay | Admitting: Cardiology

## 2020-07-17 ENCOUNTER — Other Ambulatory Visit: Payer: Self-pay

## 2020-07-17 ENCOUNTER — Ambulatory Visit (INDEPENDENT_AMBULATORY_CARE_PROVIDER_SITE_OTHER): Payer: Medicare HMO | Admitting: Cardiology

## 2020-07-17 VITALS — BP 126/72 | HR 58 | Ht 67.0 in | Wt 194.6 lb

## 2020-07-17 DIAGNOSIS — N289 Disorder of kidney and ureter, unspecified: Secondary | ICD-10-CM

## 2020-07-17 DIAGNOSIS — Z8673 Personal history of transient ischemic attack (TIA), and cerebral infarction without residual deficits: Secondary | ICD-10-CM

## 2020-07-17 DIAGNOSIS — N179 Acute kidney failure, unspecified: Secondary | ICD-10-CM

## 2020-07-17 DIAGNOSIS — Z951 Presence of aortocoronary bypass graft: Secondary | ICD-10-CM

## 2020-07-17 DIAGNOSIS — Z95810 Presence of automatic (implantable) cardiac defibrillator: Secondary | ICD-10-CM | POA: Diagnosis not present

## 2020-07-17 DIAGNOSIS — I2699 Other pulmonary embolism without acute cor pulmonale: Secondary | ICD-10-CM

## 2020-07-17 DIAGNOSIS — I10 Essential (primary) hypertension: Secondary | ICD-10-CM

## 2020-07-17 DIAGNOSIS — I255 Ischemic cardiomyopathy: Secondary | ICD-10-CM

## 2020-07-17 DIAGNOSIS — D509 Iron deficiency anemia, unspecified: Secondary | ICD-10-CM

## 2020-07-17 DIAGNOSIS — I472 Ventricular tachycardia, unspecified: Secondary | ICD-10-CM

## 2020-07-17 DIAGNOSIS — I5043 Acute on chronic combined systolic (congestive) and diastolic (congestive) heart failure: Secondary | ICD-10-CM

## 2020-07-17 DIAGNOSIS — I2510 Atherosclerotic heart disease of native coronary artery without angina pectoris: Secondary | ICD-10-CM

## 2020-07-17 DIAGNOSIS — Z9861 Coronary angioplasty status: Secondary | ICD-10-CM

## 2020-07-17 LAB — COMPREHENSIVE METABOLIC PANEL
ALT: 42 IU/L (ref 0–44)
AST: 30 IU/L (ref 0–40)
Albumin/Globulin Ratio: 1.4 (ref 1.2–2.2)
Albumin: 4.4 g/dL (ref 3.8–4.8)
Alkaline Phosphatase: 89 IU/L (ref 44–121)
BUN/Creatinine Ratio: 11 (ref 10–24)
BUN: 15 mg/dL (ref 8–27)
Bilirubin Total: 0.5 mg/dL (ref 0.0–1.2)
CO2: 24 mmol/L (ref 20–29)
Calcium: 9.2 mg/dL (ref 8.6–10.2)
Chloride: 101 mmol/L (ref 96–106)
Creatinine, Ser: 1.33 mg/dL — ABNORMAL HIGH (ref 0.76–1.27)
GFR calc Af Amer: 64 mL/min/{1.73_m2} (ref >59)
GFR calc non Af Amer: 55 mL/min/{1.73_m2} — ABNORMAL LOW (ref >59)
Globulin, Total: 3.1 g/dL (ref 1.5–4.5)
Glucose: 95 mg/dL (ref 65–99)
Potassium: 4.8 mmol/L (ref 3.5–5.2)
Sodium: 140 mmol/L (ref 134–144)
Total Protein: 7.5 g/dL (ref 6.0–8.5)

## 2020-07-17 LAB — CBC
Hematocrit: 44.4 % (ref 37.5–51.0)
Hemoglobin: 15.2 g/dL (ref 13.0–17.7)
MCH: 31.7 pg (ref 26.6–33.0)
MCHC: 34.2 g/dL (ref 31.5–35.7)
MCV: 93 fL (ref 79–97)
Platelets: 301 10*3/uL (ref 150–450)
RBC: 4.8 x10E6/uL (ref 4.14–5.80)
RDW: 12.8 % (ref 11.6–15.4)
WBC: 11.3 10*3/uL — ABNORMAL HIGH (ref 3.4–10.8)

## 2020-07-17 LAB — MAGNESIUM: Magnesium: 2.1 mg/dL (ref 1.6–2.3)

## 2020-07-17 NOTE — Assessment & Plan Note (Signed)
Admitted 06/04/2020 with VT- ultimately received an ICD and placed on Amiodarone

## 2020-07-17 NOTE — Assessment & Plan Note (Signed)
09/01/12 - S/P CABG X 3 (LIMA to LAD, SVG to OM, SVG to RCA)

## 2020-07-17 NOTE — Progress Notes (Signed)
Cardiology Office Note:    Date:  07/17/2020   ID:  [name], DOB [birth year], MRN [id number]  PCP:  [name], MD  Cardiologist:  Sanda Klein, MD  Electrophysiologist:  None   Referring MD: [name], MD   No chief complaint on file. post hospital follow up  History of Present Illness:    [name] is a 65 y.o. male with a hx of CAD, s/p CABG x3 in 2014 with an LIMA-LAD, SVG-OM2, and SVG to RCA. In Sept 2020 he had angioplasty to the proximal SVG to RCA and DES to the anastomosis of the SVG- RCA.  He has chronic combined systolic and diastolic heart failure, EF 35-40% in Dec 2020.  He has hypertension and dyslipidemia.  He has had a prior stroke.    In October 2021 he presented with a STEMI secondary to in stent thrombosis of the SVG-RCA. He was treated with POBA. There was a history noncompliance with Brilinta prior to that admission. His EF had worsened- now 25-30%.  LifeVest was prescribed at discharge 05/06/2020.   He presented 06/04/2020 with weakness in church and was found to be in VT.  He had not been complaint with LifeVest. He was treated with IV Amiodarone and Lidocaine and then had bradycardia.  He required T-TVDP and intubation urgently. Cath revealed a patent SVG-RCA site.  His hospital course was complicated by AKI, lt brain CVA, PE, and DVT.  He ultimately had a St Jude ICD pacemaker placed and was discharged to rehab 12/12/021. On 06/29/2020 he was discharged home. He is not on an ARB or diuretic secondary to renal insufficieny.  He is on triple therapy with ASA, Eliquis, and Plavix.  He is on Amiodarone 200 mg BID, Lanoxin, and Toprol.   He is in the office today as a post hospital follow up. His wife accompanied him.  The patient says he feels like he has recovered from his stroke.  He denies any unusual dyspnea.  He is tolerating his current medications.    Past Medical History:  Diagnosis Date  . Borderline diabetic   . Coronary artery  disease    a. s/p CABG in 2014 with LIMA-LAD, SVG-OM2 and SVG-RC b. 03/2019: STEMI and s/p angioplasty to proximal SVG-RCA and DES to SVG-RCA anastomosis c. 04/2020: recurrent STEMI with angioplasty of SVG-RCA  . Dyslipidemia 01/11/2013  . Hiatal hernia   . Hypertension   . Mini stroke ([place])    2  . S/P CABG x 3   . STEMI (ST elevation myocardial infarction) ([place])   . Stroke [facility])     Past Surgical History:  Procedure Laterality Date  . CARDIAC CATHETERIZATION    . CORONARY ARTERY BYPASS GRAFT N/A 09/01/2012   Procedure: CORONARY ARTERY BYPASS GRAFTING (CABG);  Surgeon: Gaye Pollack, MD;  Location: [place];  Service: Open Heart Surgery;  Laterality: N/A;  Coronary Artery Bypass Grafting Times Three Using Left Internal Mammary Artery and Left Saphenous leg Vein Harvested Endoscopically  . CORONARY STENT INTERVENTION N/A 03/23/2019   Procedure: CORONARY STENT INTERVENTION;  Surgeon: Lorretta Harp, MD;  Location: [place] CV LAB;  Service: Cardiovascular;  Laterality: N/A;  . CORONARY/GRAFT ACUTE MI REVASCULARIZATION N/A 03/23/2019   Procedure: Coronary/Graft Acute MI Revascularization;  Surgeon: Lorretta Harp, MD;  Location: [place] CV LAB;  Service: Cardiovascular;  Laterality: N/A;  . CORONARY/GRAFT ACUTE MI REVASCULARIZATION N/A 05/03/2020   Procedure: Coronary/Graft Acute MI Revascularization;  Surgeon: Belva Crome, MD;  Location: [place] [facility];  Service: Cardiovascular;  Laterality: N/A;  . IABP INSERTION N/A 06/04/2020   Procedure: IABP Insertion;  Surgeon: Martinique, Peter M, MD;  Location: [place] [facility];  Service: Cardiovascular;  Laterality: N/A;  . ICD IMPLANT N/A 06/19/2020   Procedure: ICD IMPLANT;  Surgeon: Vickie Epley, MD;  Location: [place] CV LAB;  Service: Cardiovascular;  Laterality: N/A;  . INTRAOPERATIVE TRANSESOPHAGEAL ECHOCARDIOGRAM N/A 09/01/2012   Procedure: INTRAOPERATIVE TRANSESOPHAGEAL ECHOCARDIOGRAM;  Surgeon: Gaye Pollack, MD;   Location: [place] OR;  Service: Open Heart Surgery;  Laterality: N/A;  . LEFT HEART CATH AND CORONARY ANGIOGRAPHY N/A 03/23/2019   Procedure: LEFT HEART CATH AND CORONARY ANGIOGRAPHY;  Surgeon: Lorretta Harp, MD;  Location: [place] CV LAB;  Service: Cardiovascular;  Laterality: N/A;  . LEFT HEART CATH AND CORONARY ANGIOGRAPHY N/A 05/03/2020   Procedure: LEFT HEART CATH AND CORONARY ANGIOGRAPHY;  Surgeon: Belva Crome, MD;  Location: [place] [facility];  Service: Cardiovascular;  Laterality: N/A;  . LEFT HEART CATHETERIZATION WITH CORONARY ANGIOGRAM N/A 08/31/2012   Procedure: LEFT HEART CATHETERIZATION WITH CORONARY ANGIOGRAM;  Surgeon: Peter M Martinique, MD;  Location: [facility] CATH LAB;  Service: Cardiovascular;  Laterality: N/A;  . PERCUTANEOUS CORONARY STENT INTERVENTION (PCI-S) N/A 08/31/2012   Procedure: PERCUTANEOUS CORONARY STENT INTERVENTION (PCI-S);  Surgeon: Peter M Martinique, MD;  Location: [facility] CATH LAB;  Service: Cardiovascular;  Laterality: N/A;  . RIGHT/LEFT HEART CATH AND CORONARY/GRAFT ANGIOGRAPHY N/A 06/04/2020   Procedure: RIGHT/LEFT HEART CATH AND CORONARY/GRAFT ANGIOGRAPHY;  Surgeon: Martinique, Peter M, MD;  Location: [place] CV LAB;  Service: Cardiovascular;  Laterality: N/A;  . TEMPORARY PACEMAKER N/A 06/04/2020   Procedure: TEMPORARY PACEMAKER;  Surgeon: Martinique, Peter M, MD;  Location: [place] CV LAB;  Service: Cardiovascular;  Laterality: N/A;    Current Medications: Current Meds  Medication Sig  . acetaminophen (TYLENOL) 325 MG tablet Take 2 tablets (650 mg total) by mouth every 4 (four) hours as needed for mild pain or headache.  Marland Kitchen amiodarone (PACERONE) 200 MG tablet Take 1 tablet (200 mg total) by mouth 2 (two) times daily.  Marland Kitchen apixaban (ELIQUIS) 5 MG TABS tablet Take 1 tablet (5 mg total) by mouth 2 (two) times daily.  Marland Kitchen aspirin 81 MG chewable tablet Chew 81 mg by mouth daily.  Marland Kitchen atorvastatin (LIPITOR) 80 MG tablet TAKE 1 TABLET BY MOUTH DAILY AT 6 PM.  . clopidogrel  (PLAVIX) 75 MG tablet Take 1 tablet (75 mg total) by mouth daily.  . digoxin (LANOXIN) 0.125 MG tablet Take 1 tablet (0.125 mg total) by mouth daily.  Marland Kitchen escitalopram (LEXAPRO) 10 MG tablet Take 1 tablet (10 mg total) by mouth daily.  Marland Kitchen ezetimibe (ZETIA) 10 MG tablet Take 1 tablet (10 mg total) by mouth daily.  . hydrALAZINE (APRESOLINE) 10 MG tablet Take 1 tablet (10 mg total) by mouth every 8 (eight) hours.  . melatonin 3 MG TABS tablet Take 1 tablet (3 mg total) by mouth at bedtime.  . metoprolol succinate (TOPROL-XL) 25 MG 24 hr tablet Take 0.5 tablets (12.5 mg total) by mouth daily.  . Multiple Vitamins-Minerals (ONE-A-DAY MENS 50+ ADVANTAGE) TABS Take 1 tablet by mouth daily with breakfast.  . nitroGLYCERIN (NITROSTAT) 0.4 MG SL tablet Place 1 tablet (0.4 mg total) under the tongue every 5 (five) minutes x 3 doses as needed for chest pain.     Allergies:   Patient has no known allergies.   Social History   Socioeconomic  History  . Marital status: Married    Spouse name: Not on file  . Number of children: Not on file  . Years of education: Not on file  . Highest education level: Not on file  Occupational History  . Not on file  Tobacco Use  . Smoking status: Former Smoker    Types: Pipe  . Smokeless tobacco: Former Systems developer    Quit date: 07/15/1968  Substance and Sexual Activity  . Alcohol use: No  . Drug use: No  . Sexual activity: Not Currently  Other Topics Concern  . Not on file  Social History Narrative  . Not on file   Social Determinants of Health   Financial Resource Strain: Not on file  Food Insecurity: Not on file  Transportation Needs: Not on file  Physical Activity: Not on file  Stress: Not on file  Social Connections: Not on file     Family History: The patient's family history includes Cancer in his father; Heart attack in his brother, father, and sister; Hyperlipidemia in his brother and father; Hypertension in his brother, father, and sister.  ROS:    Please see the history of present illness.     All other systems reviewed and are negative.  EKGs/Labs/Other Studies Reviewed:    The following studies were reviewed today: Cath 06/04/2020-   Mid LAD lesion is 100% stenosed.  Prox Cx lesion is 100% stenosed.  Prox RCA lesion is 100% stenosed.  Dist RCA lesion is 20% stenosed.  Non-stenotic Origin to Prox Graft lesion was previously treated.  Prox Graft lesion is 30% stenosed.  LV end diastolic pressure is mildly elevated.   1. Severe 3 vessel occlusive CAD. 2. Patent LIMA to the LAD 3. Patent SVG to OM1 4. Patent SVG to RCA. Much improved flow from prior PCI. 5. Mildly elevated LVEDP 17 mm Hg 6. Successful placement of temporary transvenous pacemaker.  Plan: Dr Haroldine Laws placed a [name] catheter via the right internal jugular Vein. Will leave temporary transvenous pacemaker in place so that antiarrhythmic drugs can be used liberally.  Right heart cath demonstrates good cardiac output and right heart pressures so we deferred placing an IABP. Will need right femoral arterial sheath removed manually.   EKG:  EKG is ordered today.  The ekg ordered 06/20/2020 demonstrates NSR, HR 68, inferior lateral Qs, LAD  Recent Labs: 05/04/2020: B Natriuretic Peptide 1,408.4 06/09/2020: TSH 1.557 06/23/2020: Magnesium 2.2 06/25/2020: ALT 49; Hemoglobin 15.3; Platelets 665 06/29/2020: BUN 46; Creatinine, Ser 1.70; Potassium 4.5; Sodium 136  Recent Lipid Panel    Component Value Date/Time   CHOL 131 06/14/2020 0548   CHOL 126 10/12/2019 0835   TRIG 165 (H) 06/14/2020 0548   HDL 25 (L) 06/14/2020 0548   HDL 29 (L) 10/12/2019 0835   CHOLHDL 5.2 06/14/2020 0548   VLDL 33 06/14/2020 0548   LDLCALC 73 06/14/2020 0548   LDLCALC 58 10/12/2019 0835   LDLDIRECT 94.8 03/23/2019 2046    Physical Exam:    VS:  BP 126/72   Pulse (!) 58   Ht 5\' 7"  (1.702 m)   Wt 194 lb 9.6 oz (88.3 kg)   SpO2 96%   BMI 30.48 kg/m     Wt  Readings from Last 3 Encounters:  07/17/20 194 lb 9.6 oz (88.3 kg)  06/29/20 183 lb 6.8 oz (83.2 kg)  06/24/20 181 lb 12.8 oz (82.5 kg)     GEN: Well nourished, well developed male, looks older than his stated age,  in no acute distress HEENT: Normal NECK: No JVD CARDIAC: RRR, no murmurs, rubs, gallops RESPIRATORY:  Clear to auscultation without rales, wheezing or rhonchi  ABDOMEN: Soft, non-distended MUSCULOSKELETAL:  No edema; No deformity  SKIN: Warm and dry NEUROLOGIC:  Alert and oriented x 3 PSYCHIATRIC:  Normal affect   ASSESSMENT:    Ventricular tachycardia ([place]) Admitted 06/04/2020 with VT- ultimately received an ICD and placed on Amiodarone 200 mg BID  CAD S/P percutaneous coronary angioplasty Sept 2020- SVG-RCA PCI with DES-( proximal SVG POBA and distal native RCA DES through the SVG) LIMA-LAD and SVG-OM2 patent Oct 2021- PCI to [place] site secondary to non compliance with Brilinta Nov 2021- Cath revealed patent PCI site  Ischemic cardiomyopathy EF 35-40% by echo 03/23/2019 EF 25% by echo Oct 2021-(LifeVest prescribed) EF 25-30% by echo Nov 2021 when he presented with VT  Hx of CABG 09/01/12 - S/P CABG X 3 (LIMA to LAD, SVG to OM, SVG to RCA)   Cardiac defibrillator in situ St Jude ICD placed Dec 2021  Acute pulmonary embolism without acute cor pulmonale ([place]) PE-DVT during hospitalization Nov-Dec 2021 On Eliquis at discharge as well as ASA and Plavix for CAD/ recent PCI.  Status post CVA H/O CVA in 2021 Acte Lt brain stroke during admission Nov-Dec 2021  AKI (acute kidney injury) ([place]) Last SCr 1.7- check labs today  Essential hypertension B/P stable  PLAN:    Continue current medications- including triple therapy and Amiodarone 200 mg BID for VT.  Check labs today including CMET, Mg++, and CBC.  Dr Haroldine Laws had mentioned seeing him in f/u-I will make sure he has an appointment with the CHF clinic.  Dr Sallyanne Kuster to see in Feb.    Medication  Adjustments/Labs and Tests Ordered: Current medicines are reviewed at length with the patient today.  Concerns regarding medicines are outlined above.  Orders Placed This Encounter  Procedures  . Magnesium  . CBC  . Comprehensive metabolic panel  . Ambulatory referral to Structural [facility] Clinic (only at [place])   No orders of the defined types were placed in this encounter.   Patient Instructions  Medication Instructions:  No Changes *If you need a refill on your cardiac medications before your next appointment, please call your pharmacy*   Lab Work: CMP,CBC, Magnesium Level If you have labs (blood work) drawn today and your tests are completely normal, you will receive your results only by: Marland Kitchen MyChart Message (if you have MyChart) OR . A paper copy in the mail If you have any lab test that is abnormal or we need to change your treatment, we will call you to review the results.   Testing/Procedures: No Testing Ordered   Follow-Up: At [facility], you and your health needs are our priority.  As part of our continuing mission to provide you with exceptional heart care, we have created designated Provider Care Teams.  These Care Teams include your primary Cardiologist (physician) and Advanced Practice Providers (APPs -  Physician Assistants and Nurse Practitioners) who all work together to provide you with the care you need, when you need it.  We recommend signing up for the patient portal called "MyChart".  Sign up information is provided on this After Visit Summary.  MyChart is used to connect with patients for Virtual Visits (Telemedicine).  Patients are able to view lab/test results, encounter notes, upcoming appointments, etc.  Non-urgent messages can be sent to your provider as well.   To learn more about what you  can do with MyChart, go to [email or web address].    Your next appointment:   August 22, 2020 10:40 AM  The format for your next appointment:   In  Person  Provider:   Sanda Klein, MD  Referral for Structural Heart Clinic    Signed, Kerin Ransom, [place]  07/17/2020 9:55 AM    [place]

## 2020-07-17 NOTE — Assessment & Plan Note (Signed)
EF 35-40% by echo 03/23/2019 EF 25% by echo Oct 2021-(LifeVest prescribed) EF 25-30% by echo Nov 2021 when he presented with VT

## 2020-07-17 NOTE — Patient Instructions (Addendum)
Medication Instructions:  No Changes *If you need a refill on your cardiac medications before your next appointment, please call your pharmacy*   Lab Work: CMP,CBC, Magnesium Level If you have labs (blood work) drawn today and your tests are completely normal, you will receive your results only by: Marland Kitchen MyChart Message (if you have MyChart) OR . A paper copy in the mail If you have any lab test that is abnormal or we need to change your treatment, we will call you to review the results.   Testing/Procedures: No Testing Ordered   Follow-Up: At [facility], you and your health needs are our priority.  As part of our continuing mission to provide you with exceptional heart care, we have created designated Provider Care Teams.  These Care Teams include your primary Cardiologist (physician) and Advanced Practice Providers (APPs -  Physician Assistants and Nurse Practitioners) who all work together to provide you with the care you need, when you need it.  We recommend signing up for the patient portal called "MyChart".  Sign up information is provided on this After Visit Summary.  MyChart is used to connect with patients for Virtual Visits (Telemedicine).  Patients are able to view lab/test results, encounter notes, upcoming appointments, etc.  Non-urgent messages can be sent to your provider as well.   To learn more about what you can do with MyChart, go to [email or web address].    Your next appointment:   August 22, 2020 10:40 AM  The format for your next appointment:   In Person  Provider:   Sanda Klein, MD  Referral for [facility]

## 2020-07-17 NOTE — Assessment & Plan Note (Signed)
Sept 2020- SVG-RCA PCI with DES-( proximal SVG POBA and distal native RCA DES through the SVG) LIMA-LAD and SVG-OM2 patent Oct 2021- PCI to [place] site secondary to non compliance with Brilinta Nov 2021- Cath revealed patent PCI site

## 2020-07-17 NOTE — Assessment & Plan Note (Signed)
BP stable.

## 2020-07-17 NOTE — Assessment & Plan Note (Signed)
Last SCr 1.7- check labs today

## 2020-07-17 NOTE — Assessment & Plan Note (Signed)
St Jude ICD placed Dec 2021

## 2020-07-17 NOTE — Assessment & Plan Note (Signed)
H/O CVA in 2021 Acte Lt brain stroke during admission Nov-Dec 2021

## 2020-07-17 NOTE — Progress Notes (Signed)
Was the plan to keep on triple therapy beyond 30 days? Or is it time to stop aspirin?

## 2020-07-17 NOTE — Assessment & Plan Note (Signed)
PE-DVT during hospitalization Nov-Dec 2021 On Eliquis at discharge

## 2020-07-17 NOTE — Progress Notes (Signed)
I say stop the aspirin since it is well over a month, keep on plavix and eliquis

## 2020-07-17 NOTE — Addendum Note (Signed)
Addended by: Merri Ray A on: 07/17/2020 04:05 PM   Modules accepted: Orders

## 2020-07-19 DIAGNOSIS — I69351 Hemiplegia and hemiparesis following cerebral infarction affecting right dominant side: Secondary | ICD-10-CM | POA: Diagnosis not present

## 2020-07-19 DIAGNOSIS — Z951 Presence of aortocoronary bypass graft: Secondary | ICD-10-CM | POA: Diagnosis not present

## 2020-07-19 DIAGNOSIS — Z95810 Presence of automatic (implantable) cardiac defibrillator: Secondary | ICD-10-CM | POA: Diagnosis not present

## 2020-07-19 DIAGNOSIS — Z87891 Personal history of nicotine dependence: Secondary | ICD-10-CM | POA: Diagnosis not present

## 2020-07-19 DIAGNOSIS — I252 Old myocardial infarction: Secondary | ICD-10-CM | POA: Diagnosis not present

## 2020-07-19 DIAGNOSIS — E785 Hyperlipidemia, unspecified: Secondary | ICD-10-CM | POA: Diagnosis not present

## 2020-07-19 DIAGNOSIS — I69318 Other symptoms and signs involving cognitive functions following cerebral infarction: Secondary | ICD-10-CM | POA: Diagnosis not present

## 2020-07-19 DIAGNOSIS — I2510 Atherosclerotic heart disease of native coronary artery without angina pectoris: Secondary | ICD-10-CM | POA: Diagnosis not present

## 2020-07-19 DIAGNOSIS — I10 Essential (primary) hypertension: Secondary | ICD-10-CM | POA: Diagnosis not present

## 2020-07-20 ENCOUNTER — Telehealth: Payer: Self-pay

## 2020-07-20 DIAGNOSIS — I10 Essential (primary) hypertension: Secondary | ICD-10-CM | POA: Diagnosis not present

## 2020-07-20 DIAGNOSIS — I2510 Atherosclerotic heart disease of native coronary artery without angina pectoris: Secondary | ICD-10-CM | POA: Diagnosis not present

## 2020-07-20 DIAGNOSIS — Z95810 Presence of automatic (implantable) cardiac defibrillator: Secondary | ICD-10-CM | POA: Diagnosis not present

## 2020-07-20 DIAGNOSIS — E785 Hyperlipidemia, unspecified: Secondary | ICD-10-CM | POA: Diagnosis not present

## 2020-07-20 DIAGNOSIS — I69318 Other symptoms and signs involving cognitive functions following cerebral infarction: Secondary | ICD-10-CM | POA: Diagnosis not present

## 2020-07-20 DIAGNOSIS — Z951 Presence of aortocoronary bypass graft: Secondary | ICD-10-CM | POA: Diagnosis not present

## 2020-07-20 DIAGNOSIS — I69351 Hemiplegia and hemiparesis following cerebral infarction affecting right dominant side: Secondary | ICD-10-CM | POA: Diagnosis not present

## 2020-07-20 DIAGNOSIS — I252 Old myocardial infarction: Secondary | ICD-10-CM | POA: Diagnosis not present

## 2020-07-20 DIAGNOSIS — Z87891 Personal history of nicotine dependence: Secondary | ICD-10-CM | POA: Diagnosis not present

## 2020-07-20 NOTE — Addendum Note (Signed)
Addended by: Jacqulynn Cadet on: 07/20/2020 09:27 AM   Modules accepted: Orders

## 2020-07-20 NOTE — Telephone Encounter (Addendum)
Tried calling patient no answer and a recording stating that call could not be completed at this time to please hang up and try call again. I will try calling patient again with the information.  ----- Message from Erlene Quan, PA-C sent at 07/18/2020 12:55 PM EST ----- Please tell the patient he can stop his aspirin- continue Eliquis and Plavix.  Kerin Ransom PA-C 07/18/2020 12:56 PM

## 2020-07-25 DIAGNOSIS — I10 Essential (primary) hypertension: Secondary | ICD-10-CM | POA: Diagnosis not present

## 2020-07-25 DIAGNOSIS — Z951 Presence of aortocoronary bypass graft: Secondary | ICD-10-CM | POA: Diagnosis not present

## 2020-07-25 DIAGNOSIS — I252 Old myocardial infarction: Secondary | ICD-10-CM | POA: Diagnosis not present

## 2020-07-25 DIAGNOSIS — Z95810 Presence of automatic (implantable) cardiac defibrillator: Secondary | ICD-10-CM | POA: Diagnosis not present

## 2020-07-25 DIAGNOSIS — Z87891 Personal history of nicotine dependence: Secondary | ICD-10-CM | POA: Diagnosis not present

## 2020-07-25 DIAGNOSIS — I69318 Other symptoms and signs involving cognitive functions following cerebral infarction: Secondary | ICD-10-CM | POA: Diagnosis not present

## 2020-07-25 DIAGNOSIS — I2510 Atherosclerotic heart disease of native coronary artery without angina pectoris: Secondary | ICD-10-CM | POA: Diagnosis not present

## 2020-07-25 DIAGNOSIS — E785 Hyperlipidemia, unspecified: Secondary | ICD-10-CM | POA: Diagnosis not present

## 2020-07-25 DIAGNOSIS — I69351 Hemiplegia and hemiparesis following cerebral infarction affecting right dominant side: Secondary | ICD-10-CM | POA: Diagnosis not present

## 2020-07-27 ENCOUNTER — Other Ambulatory Visit: Payer: Self-pay

## 2020-07-27 ENCOUNTER — Inpatient Hospital Stay: Payer: Medicare HMO | Admitting: Adult Health

## 2020-07-27 ENCOUNTER — Encounter: Payer: Medicare HMO | Attending: Physical Medicine & Rehabilitation | Admitting: Physical Medicine & Rehabilitation

## 2020-07-27 ENCOUNTER — Encounter: Payer: Self-pay | Admitting: Physical Medicine & Rehabilitation

## 2020-07-27 VITALS — BP 135/87 | HR 50 | Temp 97.9°F | Ht 67.0 in | Wt 195.0 lb

## 2020-07-27 DIAGNOSIS — I6310 Cerebral infarction due to embolism of unspecified precerebral artery: Secondary | ICD-10-CM | POA: Insufficient documentation

## 2020-07-27 DIAGNOSIS — I2699 Other pulmonary embolism without acute cor pulmonale: Secondary | ICD-10-CM | POA: Diagnosis not present

## 2020-07-27 DIAGNOSIS — N1831 Chronic kidney disease, stage 3a: Secondary | ICD-10-CM | POA: Diagnosis not present

## 2020-07-27 DIAGNOSIS — G479 Sleep disorder, unspecified: Secondary | ICD-10-CM | POA: Diagnosis not present

## 2020-07-27 NOTE — Progress Notes (Signed)
Subjective:    Patient ID: [name], male    DOB: 1956/04/26, 65 y.o.   MRN: [id number]  HPI Right-handed male with history of CAD CABG with non-STEMI 10/20 with revascularization question LV thrombus ejection fraction 25% LifeVest was placed at that time presents for follow up for acute embolic left side infarcts.  Wife supplements history. At discharge, he was instructed to follow up with Cards, which he did.  Unclear if he has followed up with Neurology. He is still on Eliquis. He has not seen PCP.  He had labs drawn. Sleep is poor, unchanged from baseline.  Denies falls. He has been abstaining from [place].  Therapies:  2/week DME: Previously owned Mobility: No assistive device requires.  Pain Inventory Average Pain 0 Pain Right Now 0 My pain is No pain here for Stroke follow up.  LOCATION OF PAIN  No pain. Right side shaky, favor right side   BOWEL Number of stools per week: 7 Oral laxative use No  Type of laxative none Enema or suppository use No  History of colostomy No  Incontinent No   BLADDER Normal In and out cath, frequency None Able to self cath No  Bladder incontinence No  Frequent urination No  Leakage with coughing No  Difficulty starting stream No  Incomplete bladder emptying No    Mobility walk without assistance how many minutes can you walk? Maybe 20 mins ability to climb steps?  yes do you drive?  no Do you have any goals in this area?  yes  Function retired  Neuro/Psych tremor  Prior Studies Any changes since last visit?  no Hospital follow up  Physicians involved in your care Any changes since last visit?  no Hospital follow up   Family History  Problem Relation Age of Onset  . Cancer Father   . Hypertension Father   . Hyperlipidemia Father   . Heart attack Father   . Hypertension Sister   . Hypertension Brother   . Hyperlipidemia Brother   . Heart attack Sister   . Heart attack Brother    Social History    Socioeconomic History  . Marital status: Married    Spouse name: Not on file  . Number of children: Not on file  . Years of education: Not on file  . Highest education level: Not on file  Occupational History  . Not on file  Tobacco Use  . Smoking status: Former Smoker    Types: Pipe  . Smokeless tobacco: Former Systems developer    Quit date: 07/15/1968  Vaping Use  . Vaping Use: Never used  Substance and Sexual Activity  . Alcohol use: No  . Drug use: No  . Sexual activity: Not Currently  Other Topics Concern  . Not on file  Social History Narrative  . Not on file   Social Determinants of Health   Financial Resource Strain: Not on file  Food Insecurity: Not on file  Transportation Needs: Not on file  Physical Activity: Not on file  Stress: Not on file  Social Connections: Not on file   Past Surgical History:  Procedure Laterality Date  . CARDIAC CATHETERIZATION    . CORONARY ARTERY BYPASS GRAFT N/A 09/01/2012   Procedure: CORONARY ARTERY BYPASS GRAFTING (CABG);  Surgeon: Gaye Pollack, MD;  Location: [facility];  Service: Open Heart Surgery;  Laterality: N/A;  Coronary Artery Bypass Grafting Times Three Using Left Internal Mammary Artery and Left Saphenous leg Vein Harvested Endoscopically  . CORONARY STENT  INTERVENTION N/A 03/23/2019   Procedure: CORONARY STENT INTERVENTION;  Surgeon: Lorretta Harp, MD;  Location: [place] [facility];  Service: Cardiovascular;  Laterality: N/A;  . CORONARY/GRAFT ACUTE MI REVASCULARIZATION N/A 03/23/2019   Procedure: Coronary/Graft Acute MI Revascularization;  Surgeon: Lorretta Harp, MD;  Location: [facility] [facility];  Service: Cardiovascular;  Laterality: N/A;  . CORONARY/GRAFT ACUTE MI REVASCULARIZATION N/A 05/03/2020   Procedure: Coronary/Graft Acute MI Revascularization;  Surgeon: Belva Crome, MD;  Location: [place] [facility] [facility];  Service: Cardiovascular;  Laterality: N/A;  . IABP INSERTION N/A 06/04/2020   Procedure: IABP Insertion;  Surgeon:  Martinique, Peter M, MD;  Location: [facility];  Service: Cardiovascular;  Laterality: N/A;  . ICD IMPLANT N/A 06/19/2020   Procedure: ICD IMPLANT;  Surgeon: Vickie Epley, MD;  Location: [place] [facility] [facility];  Service: Cardiovascular;  Laterality: N/A;  . INTRAOPERATIVE TRANSESOPHAGEAL ECHOCARDIOGRAM N/A 09/01/2012   Procedure: INTRAOPERATIVE TRANSESOPHAGEAL ECHOCARDIOGRAM;  Surgeon: Gaye Pollack, MD;  Location: [facility];  Service: Open Heart Surgery;  Laterality: N/A;  . LEFT HEART CATH AND CORONARY ANGIOGRAPHY N/A 03/23/2019   Procedure: LEFT HEART CATH AND CORONARY ANGIOGRAPHY;  Surgeon: Lorretta Harp, MD;  Location: [facility] [facility];  Service: Cardiovascular;  Laterality: N/A;  . LEFT HEART CATH AND CORONARY ANGIOGRAPHY N/A 05/03/2020   Procedure: LEFT HEART CATH AND CORONARY ANGIOGRAPHY;  Surgeon: Belva Crome, MD;  Location: [facility] [facility];  Service: Cardiovascular;  Laterality: N/A;  . LEFT HEART CATHETERIZATION WITH CORONARY ANGIOGRAM N/A 08/31/2012   Procedure: LEFT HEART CATHETERIZATION WITH CORONARY ANGIOGRAM;  Surgeon: Peter M Martinique, MD;  Location: [facility] CATH LAB;  Service: Cardiovascular;  Laterality: N/A;  . PERCUTANEOUS CORONARY STENT INTERVENTION (PCI-S) N/A 08/31/2012   Procedure: PERCUTANEOUS CORONARY STENT INTERVENTION (PCI-S);  Surgeon: Peter M Martinique, MD;  Location: [facility] CATH LAB;  Service: Cardiovascular;  Laterality: N/A;  . RIGHT/LEFT HEART CATH AND CORONARY/GRAFT ANGIOGRAPHY N/A 06/04/2020   Procedure: RIGHT/LEFT HEART CATH AND CORONARY/GRAFT ANGIOGRAPHY;  Surgeon: Martinique, Peter M, MD;  Location: [place] [facility];  Service: Cardiovascular;  Laterality: N/A;  . TEMPORARY PACEMAKER N/A 06/04/2020   Procedure: TEMPORARY PACEMAKER;  Surgeon: Martinique, Peter M, MD;  Location: [place] [facility];  Service: Cardiovascular;  Laterality: N/A;   Past Medical History:  Diagnosis Date  . Borderline diabetic   . Coronary artery disease    a. s/p CABG in 2014 with LIMA-LAD,  SVG-OM2 and SVG-RC b. 03/2019: STEMI and s/p angioplasty to proximal SVG-RCA and DES to SVG-RCA anastomosis c. 04/2020: recurrent STEMI with angioplasty of SVG-RCA  . Dyslipidemia 01/11/2013  . Hiatal hernia   . Hypertension   . Mini stroke ([place])    2  . S/P CABG x 3   . STEMI (ST elevation myocardial infarction) ([place])   . Stroke (HCC)    BP 135/87   Pulse (!) 50   Temp 97.9 F (36.6 C)   Ht 5\' 7"  (1.702 m)   Wt 195 lb (88.5 kg)   SpO2 98%   BMI 30.54 kg/m   Opioid Risk Score:   Fall Risk Score:  `1  Depression screen PHQ 2/9  Depression screen PHQ 2/9 07/27/2020  Decreased Interest 0  Down, Depressed, Hopeless 0  PHQ - 2 Score 0  Altered sleeping 3  Tired, decreased energy 0  Change in appetite 2  Feeling bad or failure about yourself  0  Trouble concentrating 0  Moving slowly or fidgety/restless 0  Suicidal thoughts 0  PHQ-9 Score 5   Review of Systems  Gastrointestinal: Negative for constipation.  Musculoskeletal: Negative for arthralgias, back pain, gait problem and myalgias.  Neurological: Negative for dizziness, weakness and numbness.  All other systems reviewed and are negative.     Objective:   Physical Exam Constitutional: No distress . Vital signs reviewed. HENT: Normocephalic.  Atraumatic. Eyes: EOMI. No discharge. Cardiovascular: No JVD.   Respiratory: Normal effort.  No stridor.   GI: Non-distended.   Skin: Warm and dry.  Intact. Psych: Slowed. Normal behavior. Looks to wife to answer all questions. Musc: No edema in extremities.  No tenderness in extremities. Neuro: Alert and oriented x3. Motor: Grossly 4+-5/5 throughout, improving    Assessment & Plan:  Right-handed male with history of CAD CABG with non-STEMI 10/20 with revascularization question LV thrombus ejection fraction 25% LifeVest was placed at that time presents for follow up for acute embolic left side infarcts.  1. Right side hemiparesis with altered mental status secondary to  acute embolic left side infarcts             Continue therapies  Follow up with Neurology  2.  Bilateral lobar segmental pulmonary emboli.   Continue Eliquis  3. Dual-chamber pacemaker placement 06/19/2020.    Cont meds  Cont follow up with Cards  4.  CKD 3a             Creatinine 1.33 on 07/17/20, (AKI improved)  Continue fluids  5.  Sleep disturbance             Continue Melatonin              Educated on sleep hygiene  Meds reviewed Referrals reviewed All questions answered

## 2020-07-28 DIAGNOSIS — Z87891 Personal history of nicotine dependence: Secondary | ICD-10-CM | POA: Diagnosis not present

## 2020-07-28 DIAGNOSIS — I69318 Other symptoms and signs involving cognitive functions following cerebral infarction: Secondary | ICD-10-CM | POA: Diagnosis not present

## 2020-07-28 DIAGNOSIS — E785 Hyperlipidemia, unspecified: Secondary | ICD-10-CM | POA: Diagnosis not present

## 2020-07-28 DIAGNOSIS — I252 Old myocardial infarction: Secondary | ICD-10-CM | POA: Diagnosis not present

## 2020-07-28 DIAGNOSIS — I69351 Hemiplegia and hemiparesis following cerebral infarction affecting right dominant side: Secondary | ICD-10-CM | POA: Diagnosis not present

## 2020-07-28 DIAGNOSIS — I10 Essential (primary) hypertension: Secondary | ICD-10-CM | POA: Diagnosis not present

## 2020-07-28 DIAGNOSIS — Z95810 Presence of automatic (implantable) cardiac defibrillator: Secondary | ICD-10-CM | POA: Diagnosis not present

## 2020-07-28 DIAGNOSIS — I2510 Atherosclerotic heart disease of native coronary artery without angina pectoris: Secondary | ICD-10-CM | POA: Diagnosis not present

## 2020-07-28 DIAGNOSIS — Z951 Presence of aortocoronary bypass graft: Secondary | ICD-10-CM | POA: Diagnosis not present

## 2020-08-01 ENCOUNTER — Other Ambulatory Visit: Payer: Self-pay | Admitting: Pharmacist

## 2020-08-01 DIAGNOSIS — I69318 Other symptoms and signs involving cognitive functions following cerebral infarction: Secondary | ICD-10-CM | POA: Diagnosis not present

## 2020-08-01 DIAGNOSIS — Z951 Presence of aortocoronary bypass graft: Secondary | ICD-10-CM | POA: Diagnosis not present

## 2020-08-01 DIAGNOSIS — I2510 Atherosclerotic heart disease of native coronary artery without angina pectoris: Secondary | ICD-10-CM | POA: Diagnosis not present

## 2020-08-01 DIAGNOSIS — E785 Hyperlipidemia, unspecified: Secondary | ICD-10-CM | POA: Diagnosis not present

## 2020-08-01 DIAGNOSIS — I10 Essential (primary) hypertension: Secondary | ICD-10-CM | POA: Diagnosis not present

## 2020-08-01 DIAGNOSIS — I69351 Hemiplegia and hemiparesis following cerebral infarction affecting right dominant side: Secondary | ICD-10-CM | POA: Diagnosis not present

## 2020-08-01 DIAGNOSIS — I252 Old myocardial infarction: Secondary | ICD-10-CM | POA: Diagnosis not present

## 2020-08-01 DIAGNOSIS — Z87891 Personal history of nicotine dependence: Secondary | ICD-10-CM | POA: Diagnosis not present

## 2020-08-01 DIAGNOSIS — Z95810 Presence of automatic (implantable) cardiac defibrillator: Secondary | ICD-10-CM | POA: Diagnosis not present

## 2020-08-01 MED ORDER — APIXABAN 5 MG PO TABS: 5.0000 mg | ORAL_TABLET | Freq: Two times a day (BID) | ORAL | 0 refills | Status: DC

## 2020-08-01 MED ORDER — DIGOXIN 125 MCG PO TABS: 0.1250 mg | ORAL_TABLET | Freq: Every day | ORAL | 0 refills | Status: DC

## 2020-08-01 MED ORDER — AMIODARONE HCL 200 MG PO TABS: 200.0000 mg | ORAL_TABLET | Freq: Two times a day (BID) | ORAL | 0 refills | Status: DC

## 2020-08-01 MED ORDER — MELATONIN 3 MG PO TABS: 3.0000 mg | ORAL_TABLET | Freq: Every day | ORAL | 0 refills | Status: DC

## 2020-08-01 NOTE — Telephone Encounter (Signed)
Recent embolic CVA with hx of PE and DVT Eliquis '5mg'$  Last OV 07/17/2020 Next OV 08/22/2020 Scr = 1.33 Wt 88kg 64yo

## 2020-08-03 ENCOUNTER — Encounter: Payer: Medicare HMO | Admitting: Cardiovascular Disease

## 2020-08-03 DIAGNOSIS — Z95810 Presence of automatic (implantable) cardiac defibrillator: Secondary | ICD-10-CM | POA: Diagnosis not present

## 2020-08-03 DIAGNOSIS — I69318 Other symptoms and signs involving cognitive functions following cerebral infarction: Secondary | ICD-10-CM | POA: Diagnosis not present

## 2020-08-03 DIAGNOSIS — I252 Old myocardial infarction: Secondary | ICD-10-CM | POA: Diagnosis not present

## 2020-08-03 DIAGNOSIS — Z951 Presence of aortocoronary bypass graft: Secondary | ICD-10-CM | POA: Diagnosis not present

## 2020-08-03 DIAGNOSIS — E785 Hyperlipidemia, unspecified: Secondary | ICD-10-CM | POA: Diagnosis not present

## 2020-08-03 DIAGNOSIS — Z87891 Personal history of nicotine dependence: Secondary | ICD-10-CM | POA: Diagnosis not present

## 2020-08-03 DIAGNOSIS — I10 Essential (primary) hypertension: Secondary | ICD-10-CM | POA: Diagnosis not present

## 2020-08-03 DIAGNOSIS — I69351 Hemiplegia and hemiparesis following cerebral infarction affecting right dominant side: Secondary | ICD-10-CM | POA: Diagnosis not present

## 2020-08-03 DIAGNOSIS — I2510 Atherosclerotic heart disease of native coronary artery without angina pectoris: Secondary | ICD-10-CM | POA: Diagnosis not present

## 2020-08-04 ENCOUNTER — Other Ambulatory Visit (HOSPITAL_COMMUNITY): Payer: Medicare HMO

## 2020-08-08 NOTE — Progress Notes (Signed)
[facility] [address] [place]. [place] [address] [phone]       HOSPITAL FOLLOW UP NOTE  [name] Date of Birth:  20-Apr-1956 Medical Record Number:  [id number]   Reason for Referral:  hospital stroke follow up    SUBJECTIVE:   CHIEF COMPLAINT:  Chief Complaint  Patient presents with  . Follow-up    Rm 54 with wife ([name]) Pt is well, no headaches/numbness     HPI:   [name] is a 65 y.o. male with history of coronary artery disease, hypertension, dyslipidemia, STEMI's, stroke in the past with unclear residual deficits, hx LV thrombus treated w/ AC, presented on 06/04/2020 after a V. tach cardiac arrest after experiencing dizziness while at church not wearing his LifeVest as had been prescribed on a prior recent hospital discharge.  Admitted to the cardiac ICU where he was noted that he had some right-sided weakness 06/13/2020 in the morning by the occupational therapist as well as reports of confusion.  Personally reviewed hospitalization pertinent progress notes, lab work and imaging with summary provided.  Evaluated by Dr. Erlinda Hong with stroke work-up revealing small left MCA infarcts in setting of cardiomyopathy with low EF and recent LV thrombus.  CTA head/neck negative LVO but did show incidental lobar and segmental PE of upper lungs.  LE Doppler showed bilateral distal DVTs.  2D echo w/ bubble showed EF 25 to 30% but negative PFO.  TEE negative for LV thrombus and no evidence of PFO or IA shunt.  On aspirin and Plavix PTA with plans on transitioning to Eliquis per cardiology recommendations for acute PE with bilateral distal DVTs.  History of HTN stable with long-term BP goal normotensive range.  History of HLD on Zetia and fish oil PTA with LDL 73 and recommended atorvastatin 80 mg daily.  No prior history of evidence of DM with A1c 5.8.  Other stroke risk factors include advanced age, former tobacco use, former smokeless tobacco use, history  of R PLIC stroke 99991111, CAD s/p CABG 2014 and recent MI 04/2020 d/t missed dose of ticagrelor, chronic systolic and diastolic CHF and history of possible LV clot.  Hospital course complicated by acute hypoxic respiratory failure, recurrent VT complicated by cardiogenic shock s/p St. Jude ICD dual-chamber pacemaker 06/19/2020, underlying sinus node dysfunction, leukocytosis with presumed HCAP, AKI and deconditioning.  Evaluated by therapies who recommended CIR for functional decline and discharged to CIR on 06/25/2020.  Stroke:   Small L MCA infarcts in setting of cardiomyopathy with low EF and recent LV thrombus.  Code Stroke CT head No acute abnormality. R posterior cerebellar hypodensity. ASPECTS 10.     CTA head & neck no ELVO. Incidental lobar and segmental PE upper lungs. Minimal flor R VA. Moderate narrowing L V4. (suboptimal bolus)  CT perfusion no core or penumbra  MRI  Small L frontal precentral gyrus and juxtacortical L frontal white matter infarcts w/ mild edema. Small vessel disease. Atrophy.   LE Doppler bilateral distal DVTs  2D Echo w/ bubble EF 25-30%, RV mildly large. Neg bubble  TEE 11/24 neg for LV thrombus; EF 30-35%.Inferolateral HK, no PFO or IA shunt  St. Jude dual-chamber pacer placed 06/19/2020  LDL 73  HgbA1c 5.8  VTE prophylaxis - IV heparin  aspirin 81 mg daily and clopidogrel 75 mg daily prior to admission, now on aspirin 81 mg daily, clopidogrel 75 mg daily and heparin IV.  Plan to transition to Eliquis Sunday night per cardiology  Therapy recommendations:  CIR   Disposition:  CIR   Today, 08/09/2020, [name] is being seen for hospital follow-up accompanied by his wife, [name]. He reports he has been doing well without residual deficits. He plans on completing [place] therapies soon. Denies new stroke/TIA symptoms.  Currently on Eliquis, aspirin and Plavix without bleeding or bruising.  Remains on atorvastatin 80 mg daily without myalgias.  Blood pressure  today 146/87.  He does not routinely monitor at home.  He has since had follow-up with cardiology.  No concerns at this time.     ROS:   14 system review of systems performed and negative with exception of no complaints  PMH:  Past Medical History:  Diagnosis Date  . Borderline diabetic   . Coronary artery disease    a. s/p CABG in 2014 with LIMA-LAD, SVG-OM2 and SVG-RC b. 03/2019: STEMI and s/p angioplasty to proximal SVG-RCA and DES to SVG-RCA anastomosis c. 04/2020: recurrent STEMI with angioplasty of SVG-RCA  . Dyslipidemia 01/11/2013  . Hiatal hernia   . Hypertension   . Mini stroke ([place])    2  . S/P CABG x 3   . STEMI (ST elevation myocardial infarction) ([place])   . Stroke [facility])     PSH:  Past Surgical History:  Procedure Laterality Date  . CARDIAC CATHETERIZATION    . CORONARY ARTERY BYPASS GRAFT N/A 09/01/2012   Procedure: CORONARY ARTERY BYPASS GRAFTING (CABG);  Surgeon: Gaye Pollack, MD;  Location: [facility];  Service: Open Heart Surgery;  Laterality: N/A;  Coronary Artery Bypass Grafting Times Three Using Left Internal Mammary Artery and Left Saphenous leg Vein Harvested Endoscopically  . CORONARY STENT INTERVENTION N/A 03/23/2019   Procedure: CORONARY STENT INTERVENTION;  Surgeon: Lorretta Harp, MD;  Location: [place] [facility];  Service: Cardiovascular;  Laterality: N/A;  . CORONARY/GRAFT ACUTE MI REVASCULARIZATION N/A 03/23/2019   Procedure: Coronary/Graft Acute MI Revascularization;  Surgeon: Lorretta Harp, MD;  Location: [place] [facility];  Service: Cardiovascular;  Laterality: N/A;  . CORONARY/GRAFT ACUTE MI REVASCULARIZATION N/A 05/03/2020   Procedure: Coronary/Graft Acute MI Revascularization;  Surgeon: Belva Crome, MD;  Location: [place] [facility];  Service: Cardiovascular;  Laterality: N/A;  . IABP INSERTION N/A 06/04/2020   Procedure: IABP Insertion;  Surgeon: Martinique, Peter M, MD;  Location: [place] [facility];  Service: Cardiovascular;  Laterality: N/A;   . ICD IMPLANT N/A 06/19/2020   Procedure: ICD IMPLANT;  Surgeon: Vickie Epley, MD;  Location: [place] [facility];  Service: Cardiovascular;  Laterality: N/A;  . INTRAOPERATIVE TRANSESOPHAGEAL ECHOCARDIOGRAM N/A 09/01/2012   Procedure: INTRAOPERATIVE TRANSESOPHAGEAL ECHOCARDIOGRAM;  Surgeon: Gaye Pollack, MD;  Location: [facility] [facility];  Service: Open Heart Surgery;  Laterality: N/A;  . LEFT HEART CATH AND CORONARY ANGIOGRAPHY N/A 03/23/2019   Procedure: LEFT HEART CATH AND CORONARY ANGIOGRAPHY;  Surgeon: Lorretta Harp, MD;  Location: [place] [facility];  Service: Cardiovascular;  Laterality: N/A;  . LEFT HEART CATH AND CORONARY ANGIOGRAPHY N/A 05/03/2020   Procedure: LEFT HEART CATH AND CORONARY ANGIOGRAPHY;  Surgeon: Belva Crome, MD;  Location: [place] [facility];  Service: Cardiovascular;  Laterality: N/A;  . LEFT HEART CATHETERIZATION WITH CORONARY ANGIOGRAM N/A 08/31/2012   Procedure: LEFT HEART CATHETERIZATION WITH CORONARY ANGIOGRAM;  Surgeon: Peter M Martinique, MD;  Location: [facility] CATH LAB;  Service: Cardiovascular;  Laterality: N/A;  . PERCUTANEOUS CORONARY STENT INTERVENTION (PCI-S) N/A 08/31/2012   Procedure: PERCUTANEOUS CORONARY STENT INTERVENTION (PCI-S);  Surgeon: Peter M Martinique, MD;  Location: [place] CATH LAB;  Service: Cardiovascular;  Laterality: N/A;  . RIGHT/LEFT HEART CATH AND CORONARY/GRAFT ANGIOGRAPHY N/A 06/04/2020   Procedure: RIGHT/LEFT HEART CATH AND CORONARY/GRAFT ANGIOGRAPHY;  Surgeon: Martinique, Peter M, MD;  Location: [facility] [facility];  Service: Cardiovascular;  Laterality: N/A;  . TEMPORARY PACEMAKER N/A 06/04/2020   Procedure: TEMPORARY PACEMAKER;  Surgeon: Martinique, Peter M, MD;  Location: [place] [facility];  Service: Cardiovascular;  Laterality: N/A;    Social History:  Social History   Socioeconomic History  . Marital status: Married    Spouse name: Not on file  . Number of children: Not on file  . Years of education: Not on file  . Highest education level: Not on  file  Occupational History  . Not on file  Tobacco Use  . Smoking status: Former Smoker    Types: Pipe  . Smokeless tobacco: Former Systems developer    Quit date: 07/15/1968  Vaping Use  . Vaping Use: Never used  Substance and Sexual Activity  . Alcohol use: No  . Drug use: No  . Sexual activity: Not Currently  Other Topics Concern  . Not on file  Social History Narrative  . Not on file   Social Determinants of Health   Financial Resource Strain: Not on file  Food Insecurity: Not on file  Transportation Needs: Not on file  Physical Activity: Not on file  Stress: Not on file  Social Connections: Not on file  Intimate Partner Violence: Not on file    Family History:  Family History  Problem Relation Age of Onset  . Cancer Father   . Hypertension Father   . Hyperlipidemia Father   . Heart attack Father   . Hypertension Sister   . Hypertension Brother   . Hyperlipidemia Brother   . Heart attack Sister   . Heart attack Brother     Medications:   Current Outpatient Medications on File Prior to Visit  Medication Sig Dispense Refill  . acetaminophen (TYLENOL) 325 MG tablet Take 2 tablets (650 mg total) by mouth every 4 (four) hours as needed for mild pain or headache.    Marland Kitchen amiodarone (PACERONE) 200 MG tablet Take 1 tablet (200 mg total) by mouth 2 (two) times daily. 60 tablet 0  . apixaban (ELIQUIS) 5 MG TABS tablet Take 1 tablet (5 mg total) by mouth 2 (two) times daily. 60 tablet 0  . aspirin 81 MG chewable tablet Chew 81 mg by mouth daily.    Marland Kitchen atorvastatin (LIPITOR) 80 MG tablet TAKE 1 TABLET BY MOUTH DAILY AT 6 PM. 30 tablet 7  . clopidogrel (PLAVIX) 75 MG tablet Take 1 tablet (75 mg total) by mouth daily. 90 tablet 3  . digoxin (LANOXIN) 0.125 MG tablet Take 1 tablet (0.125 mg total) by mouth daily. 30 tablet 0  . escitalopram (LEXAPRO) 10 MG tablet Take 1 tablet (10 mg total) by mouth daily. 30 tablet 11  . ezetimibe (ZETIA) 10 MG tablet Take 1 tablet (10 mg total) by mouth  daily. 90 tablet 3  . hydrALAZINE (APRESOLINE) 10 MG tablet Take 1 tablet (10 mg total) by mouth every 8 (eight) hours. 90 tablet 0  . melatonin 3 MG TABS tablet Take 1 tablet (3 mg total) by mouth at bedtime. 30 tablet 0  . metoprolol succinate (TOPROL-XL) 25 MG 24 hr tablet Take 0.5 tablets (12.5 mg total) by mouth daily. 30 tablet 0  . Multiple Vitamins-Minerals (ONE-A-DAY MENS 50+ ADVANTAGE) TABS Take 1 tablet by  mouth daily with breakfast.    . nitroGLYCERIN (NITROSTAT) 0.4 MG SL tablet Place 1 tablet (0.4 mg total) under the tongue every 5 (five) minutes x 3 doses as needed for chest pain. 25 tablet 2   No current facility-administered medications on file prior to visit.    Allergies:  No Known Allergies    OBJECTIVE:  Physical Exam  Vitals:   08/09/20 1020  BP: (!) 146/87  Pulse: (!) 59  Weight: 196 lb (88.9 kg)  Height: '5\' 7"'$  (1.702 m)   Body mass index is 30.7 kg/m. No exam data present   General: well developed, well nourished,  pleasant middle-age Caucasian male, seated, in no evident distress Head: head normocephalic and atraumatic.   Neck: supple with no carotid or supraclavicular bruits Cardiovascular: regular rate and rhythm, no murmurs Musculoskeletal: no deformity Skin:  no rash/petichiae Vascular:  Normal pulses all extremities   Neurologic Exam Mental Status: Awake and fully alert.   Fluent speech and language.  Oriented to place and time. Recent memory impaired (chronic) and remote memory intact. Attention span, concentration and fund of knowledge appropriate during visit. Mood and affect appropriate.  Cranial Nerves: Fundoscopic exam reveals sharp disc margins. Pupils equal, briskly reactive to light. Extraocular movements full without nystagmus. Visual Reading full to confrontation. Hearing intact. Facial sensation intact. Face, tongue, palate moves normally and symmetrically.  Motor: Normal bulk and tone. Normal strength in all tested extremity  muscles Sensory.: intact to touch , pinprick , position and vibratory sensation.  Coordination: Rapid alternating movements normal in all extremities. Finger-to-nose and heel-to-shin performed accurately bilaterally. Gait and Station: Arises from chair without difficulty. Stance is normal. Gait demonstrates normal stride length and balance without use of assistive device Reflexes: 1+ and symmetric. Toes downgoing.     NIHSS  0 Modified Rankin  0      ASSESSMENT: [name] is a 65 y.o. year old male history significant for recent small left MCA infarct after found to have right-sided weakness on 11/30 during admission for VT cardiac arrest (admitted 06/04/2020).  Stroke likely secondary to cardiomyopathy low EF and recent LV thrombus and evidence of PE and bilateral distal DVTs.  Vascular risk factors include acute PE and distal bilateral DVTs, recent LV thrombus, HTN, HLD, former cigarette and smokeless tobacco use, history of R PLIC stroke 99991111, CAD s/p CABG 2014 and recent MI [id number], chronic systolic and diastolic CHF/ICM with a EF as low as 25% and s/p St. Jude ICD dual-chamber pacemaker 06/19/2020     PLAN:  1. L MCA stroke :  a. Recovered well without residual deficit.   b. Continue aspirin 81 mg daily, clopidogrel 75 mg daily and Eliquis (apixaban) daily  and atorvastatin for secondary stroke prevention.  Advised ongoing use of triple antiplatelet therapy not indicated from a stroke standpoint but is currently taking per cardiology recommendations -he was advised to further discuss with cards for ongoing need and management.   c. Discussed secondary stroke prevention measures and importance of close PCP follow up for aggressive stroke risk factor management  2. Acute PE/b/l DVT: Incidental finding CTA neck and confirmed on CTA chest b/l multifocal PE majority acute in addition to subacute chronic thrombus LLL pulm artery.  Remains on Eliquis 5 mg twice daily per  cardiology 3. CAD, CHF: Close routine follow-up with cardiology 4. HTN: BP goal <130/90.  Stable on hydralazine per PCP/cardiology 5. HLD: LDL goal <70. Recent LDL 73.  Currently on atorvastatin 80 mg daily.  Request follow-up with PCP/cardiology for repeat lipid panel in the next 1 to 2 months as well as ongoing prescribing of atorvastatin   Follow up in 6 months or call earlier if needed   CC:  [facility] provider: Dr. Gillian Scarce, Lorin Mercy, MD    I spent 45 minutes of face-to-face and non-face-to-face time with patient and wife.  This included previsit chart review including recent hospitalization pertinent progress notes, lab work and imaging, lab review, study review, order entry, electronic health record documentation, patient education regarding recent stroke and etiology, importance of managing stroke risk factors and answered all other questions to patient and wife's satisfaction   Frann Rider, AGNP-BC  [facility] [address] [place], [place] [address]  Phone [phone] Fax [phone] Note: This document was prepared with digital dictation and possible smart phrase technology. Any transcriptional errors that result from this process are unintentional.

## 2020-08-09 ENCOUNTER — Encounter: Payer: Self-pay | Admitting: Adult Health

## 2020-08-09 ENCOUNTER — Ambulatory Visit: Payer: Medicare HMO | Admitting: Adult Health

## 2020-08-09 ENCOUNTER — Other Ambulatory Visit: Payer: Self-pay

## 2020-08-09 VITALS — BP 146/87 | HR 59 | Ht 67.0 in | Wt 196.0 lb

## 2020-08-09 DIAGNOSIS — I69318 Other symptoms and signs involving cognitive functions following cerebral infarction: Secondary | ICD-10-CM | POA: Diagnosis not present

## 2020-08-09 DIAGNOSIS — I10 Essential (primary) hypertension: Secondary | ICD-10-CM | POA: Diagnosis not present

## 2020-08-09 DIAGNOSIS — I69351 Hemiplegia and hemiparesis following cerebral infarction affecting right dominant side: Secondary | ICD-10-CM | POA: Diagnosis not present

## 2020-08-09 DIAGNOSIS — I63412 Cerebral infarction due to embolism of left middle cerebral artery: Secondary | ICD-10-CM

## 2020-08-09 DIAGNOSIS — I2510 Atherosclerotic heart disease of native coronary artery without angina pectoris: Secondary | ICD-10-CM | POA: Diagnosis not present

## 2020-08-09 DIAGNOSIS — Z87891 Personal history of nicotine dependence: Secondary | ICD-10-CM | POA: Diagnosis not present

## 2020-08-09 DIAGNOSIS — E785 Hyperlipidemia, unspecified: Secondary | ICD-10-CM | POA: Diagnosis not present

## 2020-08-09 DIAGNOSIS — Z951 Presence of aortocoronary bypass graft: Secondary | ICD-10-CM | POA: Diagnosis not present

## 2020-08-09 DIAGNOSIS — Z95810 Presence of automatic (implantable) cardiac defibrillator: Secondary | ICD-10-CM | POA: Diagnosis not present

## 2020-08-09 DIAGNOSIS — I252 Old myocardial infarction: Secondary | ICD-10-CM | POA: Diagnosis not present

## 2020-08-09 NOTE — Patient Instructions (Signed)
Continue aspirin 81 mg daily, clopidogrel 75 mg daily and Eliquis (apixaban) daily  and atorvastatin and Zetia for secondary stroke prevention  Continue to follow routinely with cardiology and PCP  Continue to follow up with PCP regarding cholesterol and blood pressure management  Maintain strict control of hypertension with blood pressure goal below 130/90 and cholesterol with LDL cholesterol (bad cholesterol) goal below 70 mg/dL.       Followup in the future with me in 6 months or call earlier if needed       Thank you for coming to see [name] at [facility]. I hope we have been able to provide you high quality care today.  You may receive a patient satisfaction survey over the next few weeks. We would appreciate your feedback and comments so that we may continue to improve ourselves and the health of our patients.    Stroke Prevention Some medical conditions and lifestyle choices can lead to a higher risk for a stroke. You can help to prevent a stroke by eating healthy foods and exercising. It also helps to not smoke and to manage any health problems you may have. How can this condition affect me? A stroke is an emergency. It should be treated right away. A stroke can lead to brain damage or threaten your life. There is a better chance of surviving and getting better after a stroke if you get medical help right away. What can increase my risk? The following medical conditions may increase your risk of a stroke:  Diseases of the heart and blood vessels (cardiovascular disease).  High blood pressure (hypertension).  Diabetes.  High cholesterol.  Sickle cell disease.  Problems with blood clotting.  Being very overweight.  Sleeping problems (obstructivesleep apnea). Other risk factors include:  Being older than age 49.  A history of blood clots, stroke, or mini-stroke (TIA).  Race, ethnic background, or a family history of stroke.  Smoking or using  tobacco products.  Taking birth control pills, especially if you smoke.  Heavy alcohol and drug use.  Not being active. What actions can I take to prevent this? Manage your health conditions  High cholesterol. ? Eat a healthy diet. If this is not enough to manage your cholesterol, you may need to take medicines. ? Take medicines as told by your doctor.  High blood pressure. ? Try to keep your blood pressure below 130/80. ? If your blood pressure cannot be managed through a healthy diet and regular exercise, you may need to take medicines. ? Take medicines as told by your doctor. ? Ask your doctor if you should check your blood pressure at home. ? Have your blood pressure checked every year.  Diabetes. ? Eat a healthy diet and get regular exercise. If your blood sugar (glucose) cannot be managed through diet and exercise, you may need to take medicines. ? Take medicines as told by your doctor.  Talk to your doctor about getting checked for sleeping problems. Signs of a problem can include: ? Snoring a lot. ? Feeling very tired.  Make sure that you manage any other conditions you have. Nutrition  Follow instructions from your doctor about what to eat or drink. You may be told to: ? Eat and drink fewer calories each day. ? Limit how much salt (sodium) you use to 1,500 milligrams (mg) each day. ? Use only healthy fats for cooking, such as olive oil, canola oil, and sunflower oil. ? Eat healthy foods. To do this:  Choose foods that are high in fiber. These include whole grains, and fresh fruits and vegetables.  Eat at least 5 servings of fruits and vegetables a day. Try to fill one-half of your plate with fruits and vegetables at each meal.  Choose low-fat (lean) proteins. These include low-fat cuts of meat, chicken without skin, fish, tofu, beans, and nuts.  Eat low-fat dairy products. ? Avoid foods that:  Are high in salt.  Have saturated fat.  Have trans fat.  Have  cholesterol.  Are processed or pre-made. ? Count how many carbohydrates you eat and drink each day.   Lifestyle  If you drink alcohol: ? Limit how much you have to:  0-1 drink a day for women who are not pregnant.  0-2 drinks a day for men. ? Know how much alcohol is in your drink. In the [place] one drink equals one 12 oz bottle of beer (343m), one 5 oz glass of wine (1467m, or one 1 oz glass of hard liquor (4434m  Do not smoke or use any products that have nicotine or tobacco. If you need help quitting, ask your doctor.  Avoid secondhand smoke.  Do not use drugs. Activity  Try to stay at a healthy weight.  Get at least 30 minutes of exercise on most days, such as: ? Fast walking. ? Biking. ? Swimming.   Medicines  Take over-the-counter and prescription medicines only as told by your doctor.  Avoid taking birth control pills. Talk to your doctor about the risks of taking birth control pills if: ? You are over 35 87ars old. ? You smoke. ? You get very bad headaches. ? You have had a blood clot. Where to find more information  American Stroke Association: www.strokeassociation.org Get help right away if:  You or a loved one has any signs of a stroke. "BE FAST" is an easy way to remember the warning signs: ? B - Balance. Dizziness, sudden trouble walking, or loss of balance. ? E - Eyes. Trouble seeing or a change in how you see. ? F - Face. Sudden weakness or loss of feeling of the face. The face or eyelid may droop on one side. ? A - Arms. Weakness or loss of feeling in an arm. This happens all of a sudden and most often on one side of the body. ? S - Speech. Sudden trouble speaking, slurred speech, or trouble understanding what people say. ? T - Time. Time to call emergency services. Write down what time symptoms started.  You or a loved one has other signs of a stroke, such as: ? A sudden, very bad headache with no known cause. ? Feeling like you may vomit  (nausea). ? Vomiting. ? A seizure. These symptoms may be an emergency. Get help right away. Call your local emergency services (911 in the [place]  Do not wait to see if the symptoms will go away.  Do not drive yourself to the hospital. Summary  You can help to prevent a stroke by eating healthy, exercising, and not smoking. It also helps to manage any health problems you have.  Do not smoke or use any products that contain nicotine or tobacco.  Get help right away if you or a loved one has any signs of a stroke. This information is not intended to replace advice given to you by your health care provider. Make sure you discuss any questions you have with your health care provider. Document Revised: 01/31/2020 Document Reviewed: 01/31/2020 Elsevier  Patient Education  2021 [employer].

## 2020-08-14 NOTE — Progress Notes (Signed)
I agree with the above plan 

## 2020-08-18 DIAGNOSIS — Z95810 Presence of automatic (implantable) cardiac defibrillator: Secondary | ICD-10-CM | POA: Diagnosis not present

## 2020-08-18 DIAGNOSIS — I2510 Atherosclerotic heart disease of native coronary artery without angina pectoris: Secondary | ICD-10-CM | POA: Diagnosis not present

## 2020-08-18 DIAGNOSIS — I69351 Hemiplegia and hemiparesis following cerebral infarction affecting right dominant side: Secondary | ICD-10-CM | POA: Diagnosis not present

## 2020-08-18 DIAGNOSIS — E785 Hyperlipidemia, unspecified: Secondary | ICD-10-CM | POA: Diagnosis not present

## 2020-08-18 DIAGNOSIS — Z951 Presence of aortocoronary bypass graft: Secondary | ICD-10-CM | POA: Diagnosis not present

## 2020-08-18 DIAGNOSIS — I69318 Other symptoms and signs involving cognitive functions following cerebral infarction: Secondary | ICD-10-CM | POA: Diagnosis not present

## 2020-08-18 DIAGNOSIS — I10 Essential (primary) hypertension: Secondary | ICD-10-CM | POA: Diagnosis not present

## 2020-08-18 DIAGNOSIS — Z87891 Personal history of nicotine dependence: Secondary | ICD-10-CM | POA: Diagnosis not present

## 2020-08-18 DIAGNOSIS — I252 Old myocardial infarction: Secondary | ICD-10-CM | POA: Diagnosis not present

## 2020-08-22 ENCOUNTER — Ambulatory Visit: Payer: Medicare HMO | Admitting: Cardiovascular Disease

## 2020-08-22 ENCOUNTER — Other Ambulatory Visit: Payer: Self-pay

## 2020-08-22 ENCOUNTER — Encounter: Payer: Self-pay | Admitting: Cardiovascular Disease

## 2020-08-22 ENCOUNTER — Ambulatory Visit (INDEPENDENT_AMBULATORY_CARE_PROVIDER_SITE_OTHER): Payer: Medicare HMO | Admitting: Cardiovascular Disease

## 2020-08-22 VITALS — BP 116/80 | HR 50 | Ht 67.0 in | Wt 198.8 lb

## 2020-08-22 DIAGNOSIS — N1831 Chronic kidney disease, stage 3a: Secondary | ICD-10-CM

## 2020-08-22 DIAGNOSIS — Z5181 Encounter for therapeutic drug level monitoring: Secondary | ICD-10-CM | POA: Diagnosis not present

## 2020-08-22 DIAGNOSIS — Z95810 Presence of automatic (implantable) cardiac defibrillator: Secondary | ICD-10-CM

## 2020-08-22 DIAGNOSIS — I255 Ischemic cardiomyopathy: Secondary | ICD-10-CM

## 2020-08-22 DIAGNOSIS — F419 Anxiety disorder, unspecified: Secondary | ICD-10-CM

## 2020-08-22 DIAGNOSIS — I25810 Atherosclerosis of coronary artery bypass graft(s) without angina pectoris: Secondary | ICD-10-CM

## 2020-08-22 DIAGNOSIS — R7303 Prediabetes: Secondary | ICD-10-CM

## 2020-08-22 DIAGNOSIS — I10 Essential (primary) hypertension: Secondary | ICD-10-CM

## 2020-08-22 DIAGNOSIS — I77810 Thoracic aortic ectasia: Secondary | ICD-10-CM

## 2020-08-22 DIAGNOSIS — Z79899 Other long term (current) drug therapy: Secondary | ICD-10-CM

## 2020-08-22 DIAGNOSIS — I472 Ventricular tachycardia, unspecified: Secondary | ICD-10-CM

## 2020-08-22 DIAGNOSIS — T50905A Adverse effect of unspecified drugs, medicaments and biological substances, initial encounter: Secondary | ICD-10-CM

## 2020-08-22 DIAGNOSIS — E785 Hyperlipidemia, unspecified: Secondary | ICD-10-CM

## 2020-08-22 DIAGNOSIS — I5042 Chronic combined systolic (congestive) and diastolic (congestive) heart failure: Secondary | ICD-10-CM

## 2020-08-22 DIAGNOSIS — R001 Bradycardia, unspecified: Secondary | ICD-10-CM

## 2020-08-22 MED ORDER — METOPROLOL SUCCINATE ER 25 MG PO TB24: 25.0000 mg | ORAL_TABLET | Freq: Every day | ORAL | 1 refills | Status: DC

## 2020-08-22 MED ORDER — AMIODARONE HCL 200 MG PO TABS: 200.0000 mg | ORAL_TABLET | Freq: Every day | ORAL | 1 refills | Status: DC

## 2020-08-22 NOTE — Progress Notes (Signed)
Cardiology Office Note:    Date:  08/27/2020   ID:  [name], DOB 12/28/1955, MRN [id number]  PCP:  [name], MD  Cardiologist:  Sanda Klein, MD  Electrophysiologist:  None   Referring MD: [name], MD   Chief Complaint  Patient presents with  . Coronary Artery Disease  . Irregular Heart Beat         History of Present Illness:    [name] is a 65 y.o. male with a hx of ischemic stroke in 2012, multivessel CAD leading to bypass surgery in 2014 (LIMA to LAD, SVG-OM 2, SVG-RCA), who presented with acute myocardial infarction due to occlusion at the [facility] anastomosis on March 23, 2019, treated with angioplasty of the proximal SVG and drug-eluting stents to the SVG-RCA anastomosis), and returned with recurrent inferior wall acute ST segment elevation myocardial infarction due to thrombotic occlusion of the SVG-RCA stents on 05/03/2020.  This was treated with angioplasty without placement of new stents.  Left ventricular ejection fraction decreased from the previous baseline of 35-40% to only 25-30% following the last coronary event.  He was started on Entresto in addition to beta-blocker and a LifeVest was prescribed.   Only 10 days following his November 12 office visit which was a follow-up to the hospitalization with myocardial infarction, he had near syncope due to ventricular tachycardia (left superior axis, R/S transition V2) and was rehospitalized.  Treatment with amiodarone and lidocaine led to profound bradycardia.  A temporary pacemaker wire was inserted and he was endotracheally intubated.  Repeat cardiac catheterization showed no change in anatomy, with a widely patent stent in the SVG to RCA, normal filling pressures and normal cardiac output.  Echo showed unchanged LVEF 25-30% and a saline contrast study did not show evidence of right to left shunting. He developed a febrile illness and was treated with antibiotics for presumed ventilator  associated pneumonia.  Temporary need for pressor support and transient acute kidney injury resolved.  Prior to discharge, he received an implantable defibrillator (Dr. Quentin Ore, [facility]. Jude [place] dual-chamber device).  He was discharged on amiodarone, low-dose metoprolol succinate 12.5 mg daily and digoxin.  He is also on very low-dose hydralazine but not on nitrates.  It appears he is not receiving Entresto or spironolactone, stopped due to low blood pressure.  On maximum dose atorvastatin and Zetia.  On aspirin, clopidogrel and Eliquis.  Since then he is actually done remarkably well.  He denies any cardiovascular complaints. The patient specifically denies any chest pain at rest exertion, dyspnea at rest or with exertion, orthopnea, paroxysmal nocturnal dyspnea, syncope, palpitations, focal neurological deficits, intermittent claudication, lower extremity edema, unexplained weight gain, cough, hemoptysis or wheezing.  Interrogation of his device today shows normal findings.  He has 91% atrial pacing (even with a low rate limit set at 50) and only 19% ventricular pacing.  Corvue has reached maturity and shows a stable baseline without evidence of recent hypervolemia.  Interestingly, on treatment with amiodarone he does not have any underlying atrial activity when pacing is taken all the way down to 30 bpm.  He has a long AV delay at about 290 ms.  In the past, treatment with Delene Loll was associated with hyperkalemia. He has a mild dilation of the aortic root at 4.2 cm and dyslipidemia with very low HDL cholesterol,  essential hypertension.  He is prediabetic.   Past Medical History:  Diagnosis Date  . Borderline diabetic   . Coronary artery disease  a. s/p CABG in 2014 with LIMA-LAD, SVG-OM2 and SVG-RC b. 03/2019: STEMI and s/p angioplasty to proximal SVG-RCA and DES to SVG-RCA anastomosis c. 04/2020: recurrent STEMI with angioplasty of SVG-RCA  . Dyslipidemia 01/11/2013  . Hiatal hernia   .  Hypertension   . Mini stroke ([place])    2  . S/P CABG x 3   . STEMI (ST elevation myocardial infarction) ([place])   . Stroke [facility])     Past Surgical History:  Procedure Laterality Date  . CARDIAC CATHETERIZATION    . CORONARY ARTERY BYPASS GRAFT N/A 09/01/2012   Procedure: CORONARY ARTERY BYPASS GRAFTING (CABG);  Surgeon: Gaye Pollack, MD;  Location: [place];  Service: Open Heart Surgery;  Laterality: N/A;  Coronary Artery Bypass Grafting Times Three Using Left Internal Mammary Artery and Left Saphenous leg Vein Harvested Endoscopically  . CORONARY STENT INTERVENTION N/A 03/23/2019   Procedure: CORONARY STENT INTERVENTION;  Surgeon: Lorretta Harp, MD;  Location: [place] CV LAB;  Service: Cardiovascular;  Laterality: N/A;  . CORONARY/GRAFT ACUTE MI REVASCULARIZATION N/A 03/23/2019   Procedure: Coronary/Graft Acute MI Revascularization;  Surgeon: Lorretta Harp, MD;  Location: [place] CV LAB;  Service: Cardiovascular;  Laterality: N/A;  . CORONARY/GRAFT ACUTE MI REVASCULARIZATION N/A 05/03/2020   Procedure: Coronary/Graft Acute MI Revascularization;  Surgeon: Belva Crome, MD;  Location: [place] CV LAB;  Service: Cardiovascular;  Laterality: N/A;  . IABP INSERTION N/A 06/04/2020   Procedure: IABP Insertion;  Surgeon: Martinique, Peter M, MD;  Location: [place] CV LAB;  Service: Cardiovascular;  Laterality: N/A;  . ICD IMPLANT N/A 06/19/2020   Procedure: ICD IMPLANT;  Surgeon: Vickie Epley, MD;  Location: [place] CV LAB;  Service: Cardiovascular;  Laterality: N/A;  . INTRAOPERATIVE TRANSESOPHAGEAL ECHOCARDIOGRAM N/A 09/01/2012   Procedure: INTRAOPERATIVE TRANSESOPHAGEAL ECHOCARDIOGRAM;  Surgeon: Gaye Pollack, MD;  Location: [place] OR;  Service: Open Heart Surgery;  Laterality: N/A;  . LEFT HEART CATH AND CORONARY ANGIOGRAPHY N/A 03/23/2019   Procedure: LEFT HEART CATH AND CORONARY ANGIOGRAPHY;  Surgeon: Lorretta Harp, MD;  Location: [place] CV LAB;  Service:  Cardiovascular;  Laterality: N/A;  . LEFT HEART CATH AND CORONARY ANGIOGRAPHY N/A 05/03/2020   Procedure: LEFT HEART CATH AND CORONARY ANGIOGRAPHY;  Surgeon: Belva Crome, MD;  Location: [place] CV LAB;  Service: Cardiovascular;  Laterality: N/A;  . LEFT HEART CATHETERIZATION WITH CORONARY ANGIOGRAM N/A 08/31/2012   Procedure: LEFT HEART CATHETERIZATION WITH CORONARY ANGIOGRAM;  Surgeon: Peter M Martinique, MD;  [facility];  Service: Cardiovascular;  Laterality: N/A;  . PERCUTANEOUS CORONARY STENT INTERVENTION (PCI-S) N/A 08/31/2012   Procedure: PERCUTANEOUS CORONARY STENT INTERVENTION (PCI-S);  Surgeon: Peter M Martinique, MD;  Location: [facility] CATH LAB;  Service: Cardiovascular;  Laterality: N/A;  . RIGHT/LEFT HEART CATH AND CORONARY/GRAFT ANGIOGRAPHY N/A 06/04/2020   Procedure: RIGHT/LEFT HEART CATH AND CORONARY/GRAFT ANGIOGRAPHY;  Surgeon: Martinique, Peter M, MD;  Location: [place] CV LAB;  Service: Cardiovascular;  Laterality: N/A;  . TEMPORARY PACEMAKER N/A 06/04/2020   Procedure: TEMPORARY PACEMAKER;  Surgeon: Martinique, Peter M, MD;  Location: [place] CV LAB;  Service: Cardiovascular;  Laterality: N/A;    Current Medications: Current Meds  Medication Sig  . acetaminophen (TYLENOL) 325 MG tablet Take 2 tablets (650 mg total) by mouth every 4 (four) hours as needed for mild pain or headache.  Marland Kitchen apixaban (ELIQUIS) 5 MG TABS tablet Take 1 tablet (5 mg total) by mouth 2 (two) times daily.  Marland Kitchen  atorvastatin (LIPITOR) 80 MG tablet TAKE 1 TABLET BY MOUTH DAILY AT 6 PM.  . clopidogrel (PLAVIX) 75 MG tablet Take 1 tablet (75 mg total) by mouth daily.  . digoxin (LANOXIN) 0.125 MG tablet Take 1 tablet (0.125 mg total) by mouth daily.  Marland Kitchen escitalopram (LEXAPRO) 10 MG tablet Take 1 tablet (10 mg total) by mouth daily.  Marland Kitchen ezetimibe (ZETIA) 10 MG tablet Take 1 tablet (10 mg total) by mouth daily.  . hydrALAZINE (APRESOLINE) 10 MG tablet Take 1 tablet (10 mg total) by mouth every 8 (eight) hours.   . melatonin 3 MG TABS tablet Take 1 tablet (3 mg total) by mouth at bedtime.  . Multiple Vitamins-Minerals (ONE-A-DAY MENS 50+ ADVANTAGE) TABS Take 1 tablet by mouth daily with breakfast.  . nitroGLYCERIN (NITROSTAT) 0.4 MG SL tablet Place 1 tablet (0.4 mg total) under the tongue every 5 (five) minutes x 3 doses as needed for chest pain.  . [DISCONTINUED] amiodarone (PACERONE) 200 MG tablet Take 1 tablet (200 mg total) by mouth 2 (two) times daily.  . [DISCONTINUED] aspirin 81 MG chewable tablet Chew 81 mg by mouth daily.  . [DISCONTINUED] metoprolol succinate (TOPROL-XL) 25 MG 24 hr tablet Take 0.5 tablets (12.5 mg total) by mouth daily.     Allergies:   Patient has no known allergies.   Social History   Socioeconomic History  . Marital status: Married    Spouse name: Not on file  . Number of children: Not on file  . Years of education: Not on file  . Highest education level: Not on file  Occupational History  . Not on file  Tobacco Use  . Smoking status: Former Smoker    Types: Pipe  . Smokeless tobacco: Former Systems developer    Quit date: 07/15/1968  Vaping Use  . Vaping Use: Never used  Substance and Sexual Activity  . Alcohol use: No  . Drug use: No  . Sexual activity: Not Currently  Other Topics Concern  . Not on file  Social History Narrative  . Not on file   Social Determinants of Health   Financial Resource Strain: Not on file  Food Insecurity: Not on file  Transportation Needs: Not on file  Physical Activity: Not on file  Stress: Not on file  Social Connections: Not on file     Family History: The patient's family history includes Cancer in his father; Heart attack in his brother, father, and sister; Hyperlipidemia in his brother and father; Hypertension in his brother, father, and sister.  ROS:   Please see the history of present illness.     All other systems reviewed and are negative.  EKGs/Labs/Other Studies Reviewed:    The following studies were reviewed  today:  CATH 05/03/2020:  Acute inferior STEMI related to thrombotic occlusion of the saphenous vein graft to the right coronary, with probable late stent thrombosis after missing Brilinta for 3 days.  Successful angioplasty with reperfusion of the inferior wall reducing the 100% stenosis in the distal bypass graft and native vessel anastomosis, using balloon angioplasty reducing the stenosis to 50% with TIMI grade III flow.  Diffuse proximal to mid 70% stenosis in the vein graft to the obtuse marginal  Patent LIMA to LAD  Totally occluded proximal RCA  Totally occluded proximal to mid circumflex  Totally occluded mid LAD.  Ostial 60% first diagonal.  Widely patent left main  LVEDP was not obtained as the patient developed ventricular tachycardia upon entry into the LV with the  Judkins right catheter.  RECOMMENDATIONS:   IV nitroglycerin for blood pressure, improvement in microcirculatory flow, and to reduce LV end-diastolic pressures.  Aspirin and Plavix should be continued indefinitely.  Consider coronary bypass grafting of the to degenerated vein conduits as noted above.  Risk factor modification  Cath 06/04/2020  Mid LAD lesion is 100% stenosed.  Prox Cx lesion is 100% stenosed.  Prox RCA lesion is 100% stenosed.  Dist RCA lesion is 20% stenosed.  Non-stenotic Origin to Prox Graft lesion was previously treated.  Prox Graft lesion is 30% stenosed.  LV end diastolic pressure is mildly elevated.   1. Severe 3 vessel occlusive CAD. 2. Patent LIMA to the LAD 3. Patent SVG to OM1 4. Patent SVG to RCA. Much improved flow from prior PCI. 5. Mildly elevated LVEDP 17 mm Hg 6. Successful placement of temporary transvenous pacemaker.  Plan: Dr Haroldine Laws placed a [name] catheter via the right internal jugular Vein. Will leave temporary transvenous pacemaker in place so that antiarrhythmic drugs can be used liberally.  Right heart cath demonstrates good cardiac  output and right heart pressures so we deferred placing an IABP. Will need right femoral arterial sheath removed manually.  RHC Findings:  Ao = 174/111 (138)  LV = 169/16 RA =  7 RV = 41/6 PA = 37/20 (28) PCW = 14 Fick cardiac output/index = 11.6/5.7 PVR = 1.2 WU FA sat = 99% PA sat = 85%, 86% SVC sat = 89%   High cardiac output with normal filling pressures. No evidence of intracardiac shunting.    ECHO 05/03/2020 1. Since the last study on 06/23/2019 LVEF has decreased from 35-40% to  25-30%, there is a suspicion for left ventricular apical thrombus  measuring 14 x 6 mm.  2. Left ventricular ejection fraction, by estimation, is 25 to 30%. The  left ventricle has severely decreased function. The left ventricle  demonstrates global hypokinesis. The left ventricular internal cavity size  was moderately dilated. Left  ventricular diastolic parameters are consistent with Grade I diastolic  dysfunction (impaired relaxation). Elevated left atrial pressure.  3. Right ventricular systolic function is moderately reduced. The right  ventricular size is moderately enlarged. There is mildly elevated  pulmonary artery systolic pressure. The estimated right ventricular  systolic pressure is [id number] mmHg.  4. Left atrial size was moderately dilated.  5. Right atrial size was moderately dilated.  6. The mitral valve is normal in structure. Mild mitral valve  regurgitation. No evidence of mitral stenosis.  7. The aortic valve is normal in structure. Aortic valve regurgitation is  mild. No aortic stenosis is present.  8. The inferior vena cava is normal in size with greater than 50%  respiratory variability, suggesting right atrial pressure of 3 mmHg.   EKG:  EKG is ordered today.  It shows atrial paced, ventricular sensed rhythm with long AV conduction time 286 ms.  There are Q waves in the inferior leads, 1, aVL and V3-V6 and a tall R wave in lead V2, consistent with old inferior,  posterior and lateral infarction.  There is diffuse ST segment depression and the QTC is normal at 428 ms. Recent Labs: 05/04/2020: B Natriuretic Peptide 1,408.4 06/09/2020: TSH 1.557 07/17/2020: ALT 42; BUN 15; Creatinine, Ser 1.33; Hemoglobin 15.2; Magnesium 2.1; Platelets 301; Potassium 4.8; Sodium 140  Recent Lipid Panel    Component Value Date/Time   CHOL 131 06/14/2020 0548   CHOL 126 10/12/2019 0835   TRIG 165 (H) 06/14/2020 0548  HDL 25 (L) 06/14/2020 0548   HDL 29 (L) 10/12/2019 0835   CHOLHDL 5.2 06/14/2020 0548   VLDL 33 06/14/2020 0548   LDLCALC 73 06/14/2020 0548   LDLCALC 58 10/12/2019 0835   LDLDIRECT 94.8 03/23/2019 2046    Physical Exam:    VS:  BP 116/80 (BP Location: Left Arm, Patient Position: Sitting)   Pulse (!) 50   Ht '5\' 7"'$  (1.702 m)   Wt 198 lb 12.8 oz (90.2 kg)   SpO2 97%   BMI 31.14 kg/m     Wt Readings from Last 3 Encounters:  08/22/20 198 lb 12.8 oz (90.2 kg)  08/09/20 196 lb (88.9 kg)  07/27/20 195 lb (88.5 kg)     General: Alert, oriented x3, no distress, mildly obese, healthy left subclavian defibrillator site. Head: no evidence of trauma, PERRL, EOMI, no exophtalmos or lid lag, no myxedema, no xanthelasma; normal ears, nose and oropharynx Neck: normal jugular venous pulsations and no hepatojugular reflux; brisk carotid pulses without delay and no carotid bruits Chest: clear to auscultation, no signs of consolidation by percussion or palpation, normal fremitus, symmetrical and full respiratory excursions Cardiovascular: normal position and quality of the apical impulse, regular rhythm, normal first and second heart sounds, no murmurs, rubs or gallops Abdomen: no tenderness or distention, no masses by palpation, no abnormal pulsatility or arterial bruits, normal bowel sounds, no hepatosplenomegaly Extremities: no clubbing, cyanosis or edema; 2+ radial, ulnar and brachial pulses bilaterally; 2+ right femoral, posterior tibial and dorsalis pedis  pulses; 2+ left femoral, posterior tibial and dorsalis pedis pulses; no subclavian or femoral bruits Neurological: grossly nonfocal Psych: Normal mood and affect   ASSESSMENT:    1. Ventricular tachycardia ([place])   2. Encounter for monitoring amiodarone therapy   3. Coronary artery disease involving coronary bypass graft of native heart without angina pectoris   4. Chronic combined systolic and diastolic heart failure ([place])   5. Essential hypertension   6. Hyperlipidemia LDL goal <70   7. Dilated aortic root ([place])   8. Stage 3a chronic kidney disease ([place])   9. Prediabetes   10. Anxiety    PLAN:    In order of problems listed above:  1. VT: He has been on loading dose amiodarone now for about 2 months.  Decrease amiodarone to 200 mg once daily.  Blood pressure will tolerate a higher dose of beta-blocker, and it looks like he is going to pace the atrium virtually 100% of the time anyway.  Metoprolol succinate increased to 25 mg once daily.   2. Amiodarone: Reviewed potential side effects and the need to follow thyroid and liver function tests at least every 6 months (thyroid tests were normal on 06/09/2020, LFTs were normal on July 17, 2020).  Avoid sun exposure.  Yearly eye exam.  Promptly report any unexpected respiratory symptoms. 3. CAD: Denies angina pectoris.  It has been several months since his last revascularization procedure and he is receiving full dose anticoagulation as well as dual antiplatelet therapy.  Stop aspirin.  Plan clopidogrel and oral anticoagulant indefinitely.. Plan dual antiplatelet therapy indefinitely.  Continue beta-blockers and statin.   4. CHF: Despite severely depressed left ventricular ejection fraction he appears to be well compensated, NYHA functional class I and euvolemic without diuretics.  Entresto held during recent hospitalization for ventricular tachycardia due to low blood pressure and acute kidney injury, but option to restart it.  He did have  hyperkalemia with his medication in the past so have to be  cautious.  Need to figure out where we stand with the patient's assistance fund. 5. HTN: Increase metoprolol succinate today.  There may be room to restart his Entresto. 6. HLP: Target LDL less than 70, very close to target by labs performed in early December.  Has a very low HDL cholesterol.  Strongly encourage weight loss and exercise. 7. Dilated aortic root: Mild enlargement.  May choose to just use echocardiography to avoid contrast exposure due to CKD. 8. CKD 3a: Baseline creatinine around 1.3 and GFR around 55.  Recent acute kidney injury during his hospitalization with VT, but back to baseline by labs performed July 17, 2020. 9. Prediabetes: Most recent hemoglobin A1c 5.8 % not on any antidiabetic medications.  Encourage weight loss.  Needs to start a program of regular physical exercise gradually. 10. Anxiety/depression: As always, he is very quiet and it is hard to tell how he is doing.  His wife thinks that the Lexapro is helping. 11. ICD: normal device function, well healed site. Keep f/u w Dr. Quentin Ore. Rate response turned "on" since he paces 91% at 50 bpm.   Medication Adjustments/Labs and Tests Ordered: Current medicines are reviewed at length with the patient today.  Concerns regarding medicines are outlined above.  Orders Placed This Encounter  Procedures  . EKG 12-Lead   Meds ordered this encounter  Medications  . amiodarone (PACERONE) 200 MG tablet    Sig: Take 1 tablet (200 mg total) by mouth daily.    Dispense:  90 tablet    Refill:  1  . metoprolol succinate (TOPROL-XL) 25 MG 24 hr tablet    Sig: Take 1 tablet (25 mg total) by mouth daily.    Dispense:  90 tablet    Refill:  1    Patient Instructions  Medication Instructions:  STOP the Aspirin INCREASE the Metoprolol to 25 mg once daily DECREASE the Amiodarone to 200 mg once daily  We will be in touch about restarting the Entresto  *If you need a  refill on your cardiac medications before your next appointment, please call your pharmacy*   Lab Work: None ordered If you have labs (blood work) drawn today and your tests are completely normal, you will receive your results only by: Marland Kitchen MyChart Message (if you have MyChart) OR . A paper copy in the mail If you have any lab test that is abnormal or we need to change your treatment, we will call you to review the results.   Testing/Procedures: None ordered   Follow-Up: At [facility], you and your health needs are our priority.  As part of our continuing mission to provide you with exceptional heart care, we have created designated Provider Care Teams.  These Care Teams include your primary Cardiologist (physician) and Advanced Practice Providers (APPs -  Physician Assistants and Nurse Practitioners) who all work together to provide you with the care you need, when you need it.  We recommend signing up for the patient portal called "MyChart".  Sign up information is provided on this After Visit Summary.  MyChart is used to connect with patients for Virtual Visits (Telemedicine).  Patients are able to view lab/test results, encounter notes, upcoming appointments, etc.  Non-urgent messages can be sent to your provider as well.   To learn more about what you can do with MyChart, go to [email or web address].    Your next appointment:   4 month(s)  The format for your next appointment:   In Person  Provider:  You may see Sanda Klein, MD or one of the following Advanced Practice Providers on your designated Care Team:    Almyra Deforest, PA-C  Fabian Sharp, [place] or   Roby Lofts, PA-C       Signed, Sanda Klein, MD  08/27/2020 4:36 PM    [place]

## 2020-08-22 NOTE — Patient Instructions (Addendum)
Medication Instructions:  STOP the Aspirin INCREASE the Metoprolol to 25 mg once daily DECREASE the Amiodarone to 200 mg once daily  We will be in touch about restarting the Entresto  *If you need a refill on your cardiac medications before your next appointment, please call your pharmacy*   Lab Work: None ordered If you have labs (blood work) drawn today and your tests are completely normal, you will receive your results only by: Marland Kitchen MyChart Message (if you have MyChart) OR . A paper copy in the mail If you have any lab test that is abnormal or we need to change your treatment, we will call you to review the results.   Testing/Procedures: None ordered   Follow-Up: At [facility], you and your health needs are our priority.  As part of our continuing mission to provide you with exceptional heart care, we have created designated Provider Care Teams.  These Care Teams include your primary Cardiologist (physician) and Advanced Practice Providers (APPs -  Physician Assistants and Nurse Practitioners) who all work together to provide you with the care you need, when you need it.  We recommend signing up for the patient portal called "MyChart".  Sign up information is provided on this After Visit Summary.  MyChart is used to connect with patients for Virtual Visits (Telemedicine).  Patients are able to view lab/test results, encounter notes, upcoming appointments, etc.  Non-urgent messages can be sent to your provider as well.   To learn more about what you can do with MyChart, go to [email or web address].    Your next appointment:   4 month(s)  The format for your next appointment:   In Person  Provider:   You may see Sanda Klein, MD or one of the following Advanced Practice Providers on your designated Care Team:    Almyra Deforest, PA-C  Fabian Sharp, PA-C or   Roby Lofts, [place]

## 2020-08-25 ENCOUNTER — Other Ambulatory Visit: Payer: Self-pay

## 2020-08-25 ENCOUNTER — Ambulatory Visit (HOSPITAL_COMMUNITY): Payer: Medicare HMO | Attending: Cardiology

## 2020-08-25 DIAGNOSIS — I5042 Chronic combined systolic (congestive) and diastolic (congestive) heart failure: Secondary | ICD-10-CM | POA: Diagnosis not present

## 2020-08-25 LAB — ECHOCARDIOGRAM COMPLETE
Area-P 1/2: 2.34 cm2
S' Lateral: 5.2 cm

## 2020-08-25 MED ORDER — PERFLUTREN LIPID MICROSPHERE
1.0000 mL | INTRAVENOUS | Status: AC | PRN
  Administered 2020-08-25: 3 mL via INTRAVENOUS

## 2020-08-27 ENCOUNTER — Encounter: Payer: Self-pay | Admitting: Cardiovascular Disease

## 2020-08-30 ENCOUNTER — Telehealth (HOSPITAL_COMMUNITY): Payer: Self-pay | Admitting: Internal Medicine

## 2020-09-06 ENCOUNTER — Other Ambulatory Visit: Payer: Self-pay | Admitting: Cardiovascular Disease

## 2020-09-06 ENCOUNTER — Telehealth: Payer: Self-pay | Admitting: Cardiovascular Disease

## 2020-09-06 MED ORDER — APIXABAN 5 MG PO TABS: 5.0000 mg | ORAL_TABLET | Freq: Two times a day (BID) | ORAL | 1 refills | Status: DC

## 2020-09-06 NOTE — Telephone Encounter (Signed)
    Pt c/o medication issue:  1. Name of Medication:   ELIQUIS 5 MG TABS tablet    2. How are you currently taking this medication (dosage and times per day)? TAKE 1 TABLET BY MOUTH 2 TIMES DAILY.  3. Are you having a reaction (difficulty breathing--STAT)?   4. What is your medication issue? [facility] calling to verify refill, they received refill but it's only for 18 tablets. She wanted to check if that's a typo and meant for 180 tablets for 90 days supply

## 2020-09-06 NOTE — Telephone Encounter (Signed)
Was mean't to be 180 tablets with one refill however I resent rx w/correct amount of tablets

## 2020-09-19 ENCOUNTER — Encounter: Payer: Self-pay | Admitting: *Deleted

## 2020-09-19 ENCOUNTER — Ambulatory Visit (INDEPENDENT_AMBULATORY_CARE_PROVIDER_SITE_OTHER): Payer: Medicare HMO | Admitting: Cardiology

## 2020-09-19 ENCOUNTER — Other Ambulatory Visit: Payer: Self-pay

## 2020-09-19 ENCOUNTER — Encounter: Payer: Self-pay | Admitting: Cardiology

## 2020-09-19 VITALS — BP 112/80 | HR 52 | Ht 67.0 in | Wt 200.6 lb

## 2020-09-19 DIAGNOSIS — Z95810 Presence of automatic (implantable) cardiac defibrillator: Secondary | ICD-10-CM | POA: Diagnosis not present

## 2020-09-19 DIAGNOSIS — I5042 Chronic combined systolic (congestive) and diastolic (congestive) heart failure: Secondary | ICD-10-CM | POA: Diagnosis not present

## 2020-09-19 DIAGNOSIS — I472 Ventricular tachycardia, unspecified: Secondary | ICD-10-CM

## 2020-09-19 MED ORDER — HYDRALAZINE HCL 10 MG PO TABS: 10.0000 mg | ORAL_TABLET | Freq: Three times a day (TID) | ORAL | 3 refills | Status: DC
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
## 2020-09-19 NOTE — Patient Instructions (Addendum)
Medication Instructions:  Your physician recommends that you continue on your current medications as directed. Please refer to the Current Medication list given to you today.  Labwork: None ordered.  Testing/Procedures: None ordered.  Follow-Up: Your physician wants you to follow-up in: 6 months with Dr. Quentin Ore.    Remote monitoring is used to monitor your ICD from home. This monitoring reduces the number of office visits required to check your device to one time per year. It allows [place] to keep an eye on the functioning of your device to ensure it is working properly. You are scheduled for a device check from home on 09/20/2020. You may send your transmission at any time that day. If you have a wireless device, the transmission will be sent automatically. After your physician reviews your transmission, you will receive a postcard with your next transmission date.  Any Other Special Instructions Will Be Listed Below (If Applicable).  If you need a refill on your cardiac medications before your next appointment, please call your pharmacy.

## 2020-10-24 ENCOUNTER — Ambulatory Visit: Payer: Medicare HMO | Admitting: Physical Medicine & Rehabilitation

## 2020-11-08 ENCOUNTER — Other Ambulatory Visit: Payer: Self-pay | Admitting: Cardiovascular Disease

## 2020-11-13 ENCOUNTER — Encounter: Payer: Medicare HMO | Admitting: Physical Medicine & Rehabilitation

## 2020-12-21 ENCOUNTER — Encounter: Payer: Self-pay | Admitting: Cardiovascular Disease

## 2020-12-21 ENCOUNTER — Ambulatory Visit (INDEPENDENT_AMBULATORY_CARE_PROVIDER_SITE_OTHER): Payer: Medicare HMO | Admitting: Cardiovascular Disease

## 2020-12-21 ENCOUNTER — Other Ambulatory Visit: Payer: Self-pay

## 2020-12-21 VITALS — BP 138/82 | HR 49 | Ht 67.0 in | Wt 202.0 lb

## 2020-12-21 DIAGNOSIS — Z79899 Other long term (current) drug therapy: Secondary | ICD-10-CM

## 2020-12-21 DIAGNOSIS — I480 Paroxysmal atrial fibrillation: Secondary | ICD-10-CM

## 2020-12-21 DIAGNOSIS — E785 Hyperlipidemia, unspecified: Secondary | ICD-10-CM

## 2020-12-21 DIAGNOSIS — I7789 Other specified disorders of arteries and arterioles: Secondary | ICD-10-CM | POA: Diagnosis not present

## 2020-12-21 DIAGNOSIS — I5042 Chronic combined systolic (congestive) and diastolic (congestive) heart failure: Secondary | ICD-10-CM | POA: Diagnosis not present

## 2020-12-21 DIAGNOSIS — I472 Ventricular tachycardia, unspecified: Secondary | ICD-10-CM

## 2020-12-21 DIAGNOSIS — Z95810 Presence of automatic (implantable) cardiac defibrillator: Secondary | ICD-10-CM

## 2020-12-21 DIAGNOSIS — Z7901 Long term (current) use of anticoagulants: Secondary | ICD-10-CM

## 2020-12-21 DIAGNOSIS — I10 Essential (primary) hypertension: Secondary | ICD-10-CM

## 2020-12-21 DIAGNOSIS — Z5181 Encounter for therapeutic drug level monitoring: Secondary | ICD-10-CM

## 2020-12-21 DIAGNOSIS — I25810 Atherosclerosis of coronary artery bypass graft(s) without angina pectoris: Secondary | ICD-10-CM | POA: Diagnosis not present

## 2020-12-21 DIAGNOSIS — R7303 Prediabetes: Secondary | ICD-10-CM

## 2020-12-21 DIAGNOSIS — N1831 Chronic kidney disease, stage 3a: Secondary | ICD-10-CM | POA: Diagnosis not present

## 2020-12-21 MED ORDER — SACUBITRIL-VALSARTAN 24-26 MG PO TABS: 1.0000 | ORAL_TABLET | Freq: Two times a day (BID) | ORAL | 1 refills | Status: DC

## 2020-12-21 NOTE — Progress Notes (Signed)
Cardiology Office Note:    Date:  12/21/2020   ID:  [name], DOB 09-11-1955, MRN [id number]  PCP:  [name], MD  Cardiologist:  Sanda Klein, MD  Electrophysiologist:  Vickie Epley, MD   Referring MD: [name], MD   Chief Complaint  Patient presents with   Congestive Heart Failure     History of Present Illness:    [name] is a 65 y.o. male with a hx of ischemic stroke in 2012, multivessel CAD leading to bypass surgery in 2014 (LIMA to LAD, SVG-OM 2, SVG-RCA), who presented with acute myocardial infarction due to occlusion at the [facility] anastomosis on March 23, 2019, treated with angioplasty of the proximal SVG and drug-eluting stents to the SVG-RCA anastomosis), and returned with recurrent inferior wall acute ST segment elevation myocardial infarction due to thrombotic occlusion of the SVG-RCA stents on 05/03/2020.  This was treated with angioplasty without placement of new stents.  Left ventricular ejection fraction decreased from the previous baseline of 35-40% to only 25-30% following the last coronary event.  He was started on Entresto in addition to beta-blocker and a LifeVest was prescribed.   Only 10 days following his November 12 office visit which was a follow-up to the hospitalization with myocardial infarction, he had near syncope due to ventricular tachycardia (left superior axis, R/S transition V2) and was rehospitalized.  Treatment with amiodarone and lidocaine led to profound bradycardia.  A temporary pacemaker wire was inserted and he was endotracheally intubated.  Repeat cardiac catheterization showed no change in anatomy, with a widely patent stent in the SVG to RCA, normal filling pressures and normal cardiac output.  Echo showed unchanged LVEF 25-30% and a saline contrast study did not show evidence of right to left shunting. He developed a febrile illness and was treated with antibiotics for presumed ventilator associated pneumonia.   Temporary need for pressor support and transient acute kidney injury resolved.  Prior to discharge, he received an implantable defibrillator (Dr. Quentin Ore, [facility]. Jude Canby dual-chamber device).  He was discharged on amiodarone, low-dose metoprolol succinate 12.5 mg daily and digoxin.  He is also on very low-dose hydralazine but not on nitrates.  It appears he is not receiving Entresto or spironolactone, stopped due to low blood pressure.  On maximum dose atorvastatin and Zetia.  On aspirin, clopidogrel and Eliquis.  He has done well since then and has NYHA functional class I-2 status.  His wife was hospitalized in early May and he would come and visit her on a daily basis, walking long distances back and forth from the parking deck.  This is very noticeable on his defibrillator download with a markedly faster heart rate during that period of time and with less atrial pacing.  However, now that he is home, he has returned back to his very sedentary lifestyle with less than an hour of moving daily.  At his last appointment with Dr. Quentin Ore, concern was raised regarding increased frequency of ventricular pacing.  His amiodarone dose had just been decreased and there was a noticeable decrease in the burden of V pacing from about 50% down to about 25%.  However, over the last few weeks there is a steady and significant increase in ventricular pacing again up to 50-60%.  Presenting rhythm today was a paced V paced at 50 bpm.  VIP is already on with an extension of +100 ms.  Ventricular pacing persisted even when the pacing rate was taken down to 30 bpm (continue  to have a paced V paced at 30 bpm) however ventricular sensed beats were seen when the AV delay was prolonged to 350 ms.  Device interrogation continues to show virtually 100% atrial pacing over the last several weeks.  There is no atrial activity when pacing is taken all the way down to 30 bpm.  His OptiVol is in normal range.  There have been no episodes  of VT and no nonsustained.  He had several episodes of paroxysmal atrial tachycardia, all less than a minute in duration.  Only 1 of these was mis-categorized as nonsustained VT at 154 bpm, thankfully in monitor zone only.  In the past, treatment with Delene Loll was associated with hyperkalemia. He has a mild dilation of the aortic root at 4.2 cm and dyslipidemia with very low HDL cholesterol,  essential hypertension.  He is prediabetic.   Past Medical History:  Diagnosis Date   Borderline diabetic    Coronary artery disease    a. s/p CABG in 2014 with LIMA-LAD, SVG-OM2 and SVG-RC b. 03/2019: STEMI and s/p angioplasty to proximal SVG-RCA and DES to SVG-RCA anastomosis c. 04/2020: recurrent STEMI with angioplasty of SVG-RCA   Dyslipidemia 01/11/2013   Hiatal hernia    Hypertension    Mini stroke ([place])    2   S/P CABG x 3    STEMI (ST elevation myocardial infarction) ([place])    Stroke [facility])     Past Surgical History:  Procedure Laterality Date   CARDIAC CATHETERIZATION     CORONARY ARTERY BYPASS GRAFT N/A 09/01/2012   Procedure: CORONARY ARTERY BYPASS GRAFTING (CABG);  Surgeon: Gaye Pollack, MD;  Location: [place];  Service: Open Heart Surgery;  Laterality: N/A;  Coronary Artery Bypass Grafting Times Three Using Left Internal Mammary Artery and Left Saphenous leg Vein Harvested Endoscopically   CORONARY STENT INTERVENTION N/A 03/23/2019   Procedure: CORONARY STENT INTERVENTION;  Surgeon: Lorretta Harp, MD;  Location: [place] CV LAB;  Service: Cardiovascular;  Laterality: N/A;   CORONARY/GRAFT ACUTE MI REVASCULARIZATION N/A 03/23/2019   Procedure: Coronary/Graft Acute MI Revascularization;  Surgeon: Lorretta Harp, MD;  Location: [place] CV LAB;  Service: Cardiovascular;  Laterality: N/A;   CORONARY/GRAFT ACUTE MI REVASCULARIZATION N/A 05/03/2020   Procedure: Coronary/Graft Acute MI Revascularization;  Surgeon: Belva Crome, MD;  Location: [place] CV LAB;  Service:  Cardiovascular;  Laterality: N/A;   IABP INSERTION N/A 06/04/2020   Procedure: IABP Insertion;  Surgeon: Martinique, Peter M, MD;  Location: [place] CV LAB;  Service: Cardiovascular;  Laterality: N/A;   ICD IMPLANT N/A 06/19/2020   Procedure: ICD IMPLANT;  Surgeon: Vickie Epley, MD;  Location: [place] CV LAB;  Service: Cardiovascular;  Laterality: N/A;   INTRAOPERATIVE TRANSESOPHAGEAL ECHOCARDIOGRAM N/A 09/01/2012   Procedure: INTRAOPERATIVE TRANSESOPHAGEAL ECHOCARDIOGRAM;  Surgeon: Gaye Pollack, MD;  Location: [place] OR;  Service: Open Heart Surgery;  Laterality: N/A;   LEFT HEART CATH AND CORONARY ANGIOGRAPHY N/A 03/23/2019   Procedure: LEFT HEART CATH AND CORONARY ANGIOGRAPHY;  Surgeon: Lorretta Harp, MD;  Location: [place] CV LAB;  Service: Cardiovascular;  Laterality: N/A;   LEFT HEART CATH AND CORONARY ANGIOGRAPHY N/A 05/03/2020   Procedure: LEFT HEART CATH AND CORONARY ANGIOGRAPHY;  Surgeon: Belva Crome, MD;  Location: [place] CV LAB;  Service: Cardiovascular;  Laterality: N/A;   LEFT HEART CATHETERIZATION WITH CORONARY ANGIOGRAM N/A 08/31/2012   Procedure: LEFT HEART CATHETERIZATION WITH CORONARY ANGIOGRAM;  Surgeon: Peter M Martinique, MD;  Location: [facility]  CATH LAB;  Service: Cardiovascular;  Laterality: N/A;   PERCUTANEOUS CORONARY STENT INTERVENTION (PCI-S) N/A 08/31/2012   Procedure: PERCUTANEOUS CORONARY STENT INTERVENTION (PCI-S);  Surgeon: Peter M Martinique, MD;  Location: [facility] CATH LAB;  Service: Cardiovascular;  Laterality: N/A;   RIGHT/LEFT HEART CATH AND CORONARY/GRAFT ANGIOGRAPHY N/A 06/04/2020   Procedure: RIGHT/LEFT HEART CATH AND CORONARY/GRAFT ANGIOGRAPHY;  Surgeon: Martinique, Peter M, MD;  Location: [place] CV LAB;  Service: Cardiovascular;  Laterality: N/A;   TEMPORARY PACEMAKER N/A 06/04/2020   Procedure: TEMPORARY PACEMAKER;  Surgeon: Martinique, Peter M, MD;  Location: [place] CV LAB;  Service: Cardiovascular;  Laterality: N/A;    Current Medications: Current  Meds  Medication Sig   acetaminophen (TYLENOL) 325 MG tablet Take 2 tablets (650 mg total) by mouth every 4 (four) hours as needed for mild pain or headache.   amiodarone (PACERONE) 200 MG tablet TAKE 1 TABLET BY MOUTH 2 TIMES DAILY.   apixaban (ELIQUIS) 5 MG TABS tablet Take 1 tablet (5 mg total) by mouth 2 (two) times daily.   atorvastatin (LIPITOR) 80 MG tablet TAKE 1 TABLET BY MOUTH DAILY AT 6 PM.   clopidogrel (PLAVIX) 75 MG tablet Take 1 tablet (75 mg total) by mouth daily.   escitalopram (LEXAPRO) 10 MG tablet Take 1 tablet (10 mg total) by mouth daily.   hydrALAZINE (APRESOLINE) 10 MG tablet Take 1 tablet (10 mg total) by mouth every 8 (eight) hours.   melatonin 3 MG TABS tablet Take 1 tablet (3 mg total) by mouth at bedtime.   metoprolol succinate (TOPROL-XL) 25 MG 24 hr tablet Take 1 tablet (25 mg total) by mouth daily.   Multiple Vitamins-Minerals (ONE-A-DAY MENS 50+ ADVANTAGE) TABS Take 1 tablet by mouth daily with breakfast.   nitroGLYCERIN (NITROSTAT) 0.4 MG SL tablet Place 1 tablet (0.4 mg total) under the tongue every 5 (five) minutes x 3 doses as needed for chest pain.   sacubitril-valsartan (ENTRESTO) 24-26 MG Take 1 tablet by mouth 2 (two) times daily.   [DISCONTINUED] digoxin (LANOXIN) 0.125 MG tablet TAKE 1 TABLET BY MOUTH DAILY.     Allergies:   Patient has no known allergies.   Social History   Socioeconomic History   Marital status: Married    Spouse name: Not on file   Number of children: Not on file   Years of education: Not on file   Highest education level: Not on file  Occupational History   Not on file  Tobacco Use   Smoking status: Former    Pack years: 0.00    Types: Pipe   Smokeless tobacco: Former    Quit date: 07/15/1968  Vaping Use   Vaping Use: Never used  Substance and Sexual Activity   Alcohol use: No   Drug use: No   Sexual activity: Not Currently  Other Topics Concern   Not on file  Social History Narrative   Not on file   Social  Determinants of Health   Financial Resource Strain: Not on file  Food Insecurity: Not on file  Transportation Needs: Not on file  Physical Activity: Not on file  Stress: Not on file  Social Connections: Not on file     Family History: The patient's family history includes Cancer in his father; Heart attack in his brother, father, and sister; Hyperlipidemia in his brother and father; Hypertension in his brother, father, and sister.  ROS:   Please see the history of present illness.     All other systems reviewed  and are negative.  EKGs/Labs/Other Studies Reviewed:    The following studies were reviewed today: Comprehensive pacemaker download today The  paced AV delay was extended to 350 ms, sensed AV delay extended to 300 ms.  No other changes were made.  CATH 05/03/2020: Acute inferior STEMI related to thrombotic occlusion of the saphenous vein graft to the right coronary, with probable late stent thrombosis after missing Brilinta for 3 days. Successful angioplasty with reperfusion of the inferior wall reducing the 100% stenosis in the distal bypass graft and native vessel anastomosis, using balloon angioplasty reducing the stenosis to 50% with TIMI grade III flow. Diffuse proximal to mid 70% stenosis in the vein graft to the obtuse marginal Patent LIMA to LAD Totally occluded proximal RCA Totally occluded proximal to mid circumflex Totally occluded mid LAD.  Ostial 60% first diagonal. Widely patent left main LVEDP was not obtained as the patient developed ventricular tachycardia upon entry into the LV with the Judkins right catheter.   RECOMMENDATIONS:   IV nitroglycerin for blood pressure, improvement in microcirculatory flow, and to reduce LV end-diastolic pressures. Aspirin and Plavix should be continued indefinitely. Consider coronary bypass grafting of the to degenerated vein conduits as noted above. Risk factor modification  Cath 06/04/2020 Mid LAD lesion is 100%  stenosed. Prox Cx lesion is 100% stenosed. Prox RCA lesion is 100% stenosed. Dist RCA lesion is 20% stenosed. Non-stenotic Origin to Prox Graft lesion was previously treated. Prox Graft lesion is 30% stenosed. LV end diastolic pressure is mildly elevated.   1. Severe 3 vessel occlusive CAD. 2. Patent LIMA to the LAD 3. Patent SVG to OM1 4. Patent SVG to RCA. Much improved flow from prior PCI. 5. Mildly elevated LVEDP 17 mm Hg 6. Successful placement of temporary transvenous pacemaker.   Plan: Dr Haroldine Laws placed a [name] catheter via the right internal jugular Vein. Will leave temporary transvenous pacemaker in place so that antiarrhythmic drugs can be used liberally.  Right heart cath demonstrates good cardiac output and right heart pressures so we deferred placing an IABP. Will need right femoral arterial sheath removed manually.   RHC Findings:   Ao = 174/111 (138)  LV = 169/16 RA =  7 RV = 41/6 PA = 37/20 (28) PCW = 14 Fick cardiac output/index = 11.6/5.7 PVR = 1.2 WU FA sat = 99% PA sat = 85%, 86% SVC sat = 89%     High cardiac output with normal filling pressures. No evidence of intracardiac shunting.    ECHO 05/03/2020  1. Since the last study on 06/23/2019 LVEF has decreased from 35-40% to  25-30%, there is a suspicion for left ventricular apical thrombus  measuring 14 x 6 mm.   2. Left ventricular ejection fraction, by estimation, is 25 to 30%. The  left ventricle has severely decreased function. The left ventricle  demonstrates global hypokinesis. The left ventricular internal cavity size  was moderately dilated. Left  ventricular diastolic parameters are consistent with Grade I diastolic  dysfunction (impaired relaxation). Elevated left atrial pressure.   3. Right ventricular systolic function is moderately reduced. The right  ventricular size is moderately enlarged. There is mildly elevated  pulmonary artery systolic pressure. The estimated right  ventricular  systolic pressure is [id number] mmHg.   4. Left atrial size was moderately dilated.   5. Right atrial size was moderately dilated.   6. The mitral valve is normal in structure. Mild mitral valve  regurgitation. No evidence of mitral stenosis.   7.  The aortic valve is normal in structure. Aortic valve regurgitation is  mild. No aortic stenosis is present.   8. The inferior vena cava is normal in size with greater than 50%  respiratory variability, suggesting right atrial pressure of 3 mmHg.   EKG:  EKG is ordered today.  It shows Ap Vp rhythm with very broad paced QRS. Only one native AV conduction beat is seen.  Recent Labs: 05/04/2020: B Natriuretic Peptide 1,408.4 06/09/2020: TSH 1.557 07/17/2020: ALT 42; BUN 15; Creatinine, Ser 1.33; Hemoglobin 15.2; Magnesium 2.1; Platelets 301; Potassium 4.8; Sodium 140  Recent Lipid Panel    Component Value Date/Time   CHOL 131 06/14/2020 0548   CHOL 126 10/12/2019 0835   TRIG 165 (H) 06/14/2020 0548   HDL 25 (L) 06/14/2020 0548   HDL 29 (L) 10/12/2019 0835   CHOLHDL 5.2 06/14/2020 0548   VLDL 33 06/14/2020 0548   LDLCALC 73 06/14/2020 0548   LDLCALC 58 10/12/2019 0835   LDLDIRECT 94.8 03/23/2019 2046    Physical Exam:    VS:  BP 138/82   Pulse (!) 49   Ht '5\' 7"'$  (1.702 m)   Wt 202 lb (91.6 kg)   SpO2 96%   BMI 31.64 kg/m     Wt Readings from Last 3 Encounters:  12/21/20 202 lb (91.6 kg)  09/19/20 200 lb 9.6 oz (91 kg)  08/22/20 198 lb 12.8 oz (90.2 kg)      General: Alert, oriented x3, no distress, healthy subclavian defibrillator site. Head: no evidence of trauma, PERRL, EOMI, no exophtalmos or lid lag, no myxedema, no xanthelasma; normal ears, nose and oropharynx Neck: normal jugular venous pulsations and no hepatojugular reflux; brisk carotid pulses without delay and no carotid bruits Chest: clear to auscultation, no signs of consolidation by percussion or palpation, normal fremitus, symmetrical and full respiratory  excursions Cardiovascular: normal position and quality of the apical impulse, regular rhythm, normal first and second heart sounds, no murmurs, rubs or gallops Abdomen: no tenderness or distention, no masses by palpation, no abnormal pulsatility or arterial bruits, normal bowel sounds, no hepatosplenomegaly Extremities: no clubbing, cyanosis or edema; 2+ radial, ulnar and brachial pulses bilaterally; 2+ right femoral, posterior tibial and dorsalis pedis pulses; 2+ left femoral, posterior tibial and dorsalis pedis pulses; no subclavian or femoral bruits Neurological: grossly nonfocal Psych: Normal mood and affect    ASSESSMENT:    1. Ventricular tachycardia ([place])   2. Encounter for monitoring amiodarone therapy   3. Chronic combined systolic and diastolic heart failure ([place])   4. Coronary artery disease involving coronary bypass graft of native heart without angina pectoris   5. Paroxysmal atrial fibrillation (HCC)   6. Long term (current) use of anticoagulants   7. Essential hypertension   8. Aortic root enlargement (HCC)   9. Stage 3a chronic kidney disease ([place])   10. Dyslipidemia (high LDL; low HDL)   11. Prediabetes   12. ICD (implantable cardioverter-defibrillator) in place     PLAN:    In order of problems listed above:  VT: He has not had any recurrent ventricular tachycardia.  Now is on amiodarone maintenance dose and metoprolol 25 mg once daily. Amiodarone: We will need liver function tests and TSH performed.  We will schedule those with the necessary bmet in 2 weeks.  Avoid sun exposure.  Yearly eye exam.  Promptly report any unexpected respiratory symptoms. CHF: Doing remarkably well, NYHA functional class I-2 and euvolemic without the need for loop diuretics, but also is  very sedentary.  We will try to reintroduce [facility] today with repeat monitoring of potassium level (had hyperkalemia in the past).  Started minimum dose.  Ideally, this will replace his hydralazine. CAD:  Asymptomatic.  On beta-blocker and statin and high dose.  Plan combined antiplatelet and anticoagulant therapy indefinitely, barring any bleeding complications. Parox AF: none in several months. Anticoagulation: no bleeding despite concomitant clopidogrel. HTN: I on metoprolol and hydralazine.  Adding Entresto today and plan to titrate up as much as tolerated by blood pressure and potassium level. HLP: LDL 73 on most recent labs, continue high-dose atorvastatin. Dilated aortic root: Mild enlargement.  May choose to just use echocardiography to avoid contrast exposure due to CKD. CKD 3a: Baseline creatinine around 1.3 and GFR around 55.  Recent acute kidney injury during his hospitalization with VT, but back to baseline by labs performed July 17, 2020.  Recheck labs after restarting Entresto. Prediabetes: Hemoglobin A1c 5.8% without medications.  Encourage weight loss.  Needs to become more physically active. ICD: normal device function, well healed site. Keep f/u w Dr. Quentin Ore.  In the last several weeks he has had virtually 100% atrial paced rhythm and steadily increasing ventricular pacing now up to 50-60%.  Paced AV delay was extended to 350 ms and he now appears to have mostly ventricular sensed beats.  We will stop the digoxin since his heart failure so well compensated and we want to promote native AV conduction.  May need upgrade to CRT in the future. Anxiety/depression: He seems to be greatly improved compared to his last appointment.   Medication Adjustments/Labs and Tests Ordered: Current medicines are reviewed at length with the patient today.  Concerns regarding medicines are outlined above.  Orders Placed This Encounter  Procedures   Comprehensive metabolic panel   TSH   EKG 12-Lead    Meds ordered this encounter  Medications   sacubitril-valsartan (ENTRESTO) 24-26 MG    Sig: Take 1 tablet by mouth 2 (two) times daily.    Dispense:  60 tablet    Refill:  1    Please Honor  Card patient is presenting for Carmie Kanner: [id number]; Juanna CaoFX:[id number]; [id number]: [facility]; [id number]     Patient Instructions  Medication Instructions:  START Entresto 24-26 mg twice daily  STOP the Digoxin   *If you need a refill on your cardiac medications before your next appointment, please call your pharmacy*   Lab Work: Your provider would like for you to return in 2 weeks on the day of your pharmacy appointment to have the following labs drawn: CMET and TSH. You do not need an appointment for the lab. Once in our office lobby there is a podium where you can sign in and ring the doorbell to alert [place] that you are here. The lab is open from 8:00 am to 4:30 pm; closed for lunch from 12:45pm-1:45pm.  If you have labs (blood work) drawn today and your tests are completely normal, you will receive your results only by: [place] (if you have MyChart) OR A paper copy in the mail If you have any lab test that is abnormal or we need to change your treatment, we will call you to review the results.   Testing/Procedures: None ordered   Follow-Up: At [facility], you and your health needs are our priority.  As part of our continuing mission to provide you with exceptional heart care, we have created designated Provider Care Teams.  These Care Teams include your primary Cardiologist (  physician) and Advanced Practice Providers (APPs -  Physician Assistants and Nurse Practitioners) who all work together to provide you with the care you need, when you need it.  We recommend signing up for the patient portal called "MyChart".  Sign up information is provided on this After Visit Summary.  MyChart is used to connect with patients for Virtual Visits (Telemedicine).  Patients are able to view lab/test results, encounter notes, upcoming appointments, etc.  Non-urgent messages can be sent to your provider as well.   To learn more about what you can do with MyChart, go to  [email or web address].    Your next appointment:   Follow up in 2 weeks with pharmd for possible medication titration Follow up 3  months with Dr. Sallyanne Kuster on a device day     Signed, Sanda Klein, MD  12/21/2020 1:09 PM    [place]

## 2020-12-21 NOTE — Patient Instructions (Addendum)
Medication Instructions:  START Entresto 24-26 mg twice daily  STOP the Digoxin   *If you need a refill on your cardiac medications before your next appointment, please call your pharmacy*   Lab Work: Your provider would like for you to return in 2 weeks on the day of your pharmacy appointment to have the following labs drawn: CMET and TSH. You do not need an appointment for the lab. Once in our office lobby there is a podium where you can sign in and ring the doorbell to alert [place] that you are here. The lab is open from 8:00 am to 4:30 pm; closed for lunch from 12:45pm-1:45pm.  If you have labs (blood work) drawn today and your tests are completely normal, you will receive your results only by: [place] (if you have MyChart) OR A paper copy in the mail If you have any lab test that is abnormal or we need to change your treatment, we will call you to review the results.   Testing/Procedures: None ordered   Follow-Up: At [facility], you and your health needs are our priority.  As part of our continuing mission to provide you with exceptional heart care, we have created designated Provider Care Teams.  These Care Teams include your primary Cardiologist (physician) and Advanced Practice Providers (APPs -  Physician Assistants and Nurse Practitioners) who all work together to provide you with the care you need, when you need it.  We recommend signing up for the patient portal called "MyChart".  Sign up information is provided on this After Visit Summary.  MyChart is used to connect with patients for Virtual Visits (Telemedicine).  Patients are able to view lab/test results, encounter notes, upcoming appointments, etc.  Non-urgent messages can be sent to your provider as well.   To learn more about what you can do with MyChart, go to [email or web address].    Your next appointment:   Follow up in 2 weeks with pharmd for possible medication titration Follow up 3  months with Dr.  Sallyanne Kuster on a device day

## 2021-01-10 ENCOUNTER — Ambulatory Visit: Payer: Medicare HMO | Admitting: Pharmacist

## 2021-01-10 NOTE — Progress Notes (Deleted)
Patient ID: [name]                 DOB: February 19, 1956                      MRN: [id number]     HPI: [name] is a 65 y.o. male referred by Dr. Sallyanne [name] to pharmacy clinic for HF medication management. PMH is significant for ischemic stroke in 2012, pre-DM, multivessel CAD and CABG x3v in 2014 with LIMA-LAD, SVG-OM2, and SVG-RCA, acute MI 03/2019 treated with angioplasty to prox SVG-RCA and DES to anastomosis of SVG-RCA, EF 35-40% in 06/2019. Pt with recurrent STEMI 04/2020 secondary to in-stent thrombosis of SVG-RCA treated with POBA, pt had been noncompliant with Brilinta. LVEF worsened to 25-30% and LifeVest was placed. 06/03/2020 pt was rehospitalized with near syncope due to V tach arrest in setting of severe ICM, had not been compliant with [facility] course complicated by VAP, AKI, bilateral multifocal PE, subacute chronic thrombus in left lower lobe, and brain MRI with small acute infarct. He was started on Eliquis, ICD dual chamber pacemaker placed 06/19/20. At most recent visit with Dr [name] on 12/21/20, he was rechallenged on low dose Entresto (prior hyperkalemia) and digoxin was stopped.  Today she returns to pharmacy clinic for further medication titration. At last visit with MD ***. Symptomatically, she is feeling ***, *** dizziness, lightheadedness, and fatigue. *** chest pain or palpitations. Feels SOB when ***. Able to complete all ADLs. Activity level ***. She *** checks her weight at home (normal range *** - *** lbs). *** LEE, PND, or orthopnea. Appetite has been ***. She *** adheres to a low-salt diet.  Cmet and tsh today - amio monitoring  LDL goal < 40 - on atorva 80 and zetia, LDL 73 and TG 165 Repatha, Vascepa, Nexlizet tier 3 $45/$125  Needs jardiance and spiro Hyperkalemia with entresto Plan to stop hydral and keep on entresto Cost of meds will be issue (Eliquis, Entresto, SGLT2i) Labs today then discuss plan  Current CHF meds:  Toprol '25mg'$   daily Entresto 24-'26mg'$  BID Hydralazine '10mg'$  TID  BP goal: <130/19mHg  Family History: Father with HTN, HLD, and MI. Sister and brother with HTN and MI.  Social History: Former pipe smoker, quit in 1970, denies drug and alcohol use.  Diet:   Exercise: Very sedentary  Home BP readings:   Wt Readings from Last 3 Encounters:  12/21/20 202 lb (91.6 kg)  09/19/20 200 lb 9.6 oz (91 kg)  08/22/20 198 lb 12.8 oz (90.2 kg)   BP Readings from Last 3 Encounters:  12/21/20 138/82  09/19/20 112/80  08/22/20 116/80   Pulse Readings from Last 3 Encounters:  12/21/20 (!) 49  09/19/20 (!) 52  08/22/20 (!) 50    Renal function: CrCl cannot be calculated (Patient's most recent lab result is older than the maximum 21 days allowed.).  Past Medical History:  Diagnosis Date   Borderline diabetic    Coronary artery disease    a. s/p CABG in 2014 with LIMA-LAD, SVG-OM2 and SVG-RC b. 03/2019: STEMI and s/p angioplasty to proximal SVG-RCA and DES to SVG-RCA anastomosis c. 04/2020: recurrent STEMI with angioplasty of SVG-RCA   Dyslipidemia 01/11/2013   Hiatal hernia    Hypertension    [place]    2   S/P CABG x 3    [place]    [facility]     Current Outpatient Medications on File  Prior to Visit  Medication Sig Dispense Refill   acetaminophen (TYLENOL) 325 MG tablet Take 2 tablets (650 mg total) by mouth every 4 (four) hours as needed for mild pain or headache.     amiodarone (PACERONE) 200 MG tablet TAKE 1 TABLET BY MOUTH 2 TIMES DAILY. 60 tablet 0   apixaban (ELIQUIS) 5 MG TABS tablet Take 1 tablet (5 mg total) by mouth 2 (two) times daily. 180 tablet 1   atorvastatin (LIPITOR) 80 MG tablet TAKE 1 TABLET BY MOUTH DAILY AT 6 PM. 30 tablet 7   clopidogrel (PLAVIX) 75 MG tablet Take 1 tablet (75 mg total) by mouth daily. 90 tablet 3   escitalopram (LEXAPRO) 10 MG tablet Take 1 tablet (10 mg total) by mouth daily. 30 tablet 11   ezetimibe  (ZETIA) 10 MG tablet Take 1 tablet (10 mg total) by mouth daily. 90 tablet 3   hydrALAZINE (APRESOLINE) 10 MG tablet Take 1 tablet (10 mg total) by mouth every 8 (eight) hours. 270 tablet 3   melatonin 3 MG TABS tablet Take 1 tablet (3 mg total) by mouth at bedtime. 30 tablet 0   metoprolol succinate (TOPROL-XL) 25 MG 24 hr tablet Take 1 tablet (25 mg total) by mouth daily. 90 tablet 1   Multiple Vitamins-Minerals (ONE-A-DAY MENS 50+ ADVANTAGE) TABS Take 1 tablet by mouth daily with breakfast.     nitroGLYCERIN (NITROSTAT) 0.4 MG SL tablet Place 1 tablet (0.4 mg total) under the tongue every 5 (five) minutes x 3 doses as needed for chest pain. 25 tablet 2   sacubitril-valsartan (ENTRESTO) 24-26 MG Take 1 tablet by mouth 2 (two) times daily. 60 tablet 1   No current facility-administered medications on file prior to visit.    No Known Allergies   Assessment/Plan:  1. CHF -

## 2021-02-06 DIAGNOSIS — I5042 Chronic combined systolic (congestive) and diastolic (congestive) heart failure: Secondary | ICD-10-CM | POA: Diagnosis not present

## 2021-02-06 DIAGNOSIS — Z79899 Other long term (current) drug therapy: Secondary | ICD-10-CM | POA: Diagnosis not present

## 2021-02-06 DIAGNOSIS — Z5181 Encounter for therapeutic drug level monitoring: Secondary | ICD-10-CM | POA: Diagnosis not present

## 2021-02-06 LAB — COMPREHENSIVE METABOLIC PANEL
ALT: 18 IU/L (ref 0–44)
AST: 21 IU/L (ref 0–40)
Albumin/Globulin Ratio: 1.2 (ref 1.2–2.2)
Albumin: 4.1 g/dL (ref 3.8–4.8)
Alkaline Phosphatase: 100 IU/L (ref 44–121)
BUN/Creatinine Ratio: 10 (ref 10–24)
BUN: 16 mg/dL (ref 8–27)
Bilirubin Total: 0.6 mg/dL (ref 0.0–1.2)
CO2: 21 mmol/L (ref 20–29)
Calcium: 9 mg/dL (ref 8.6–10.2)
Chloride: 102 mmol/L (ref 96–106)
Creatinine, Ser: 1.58 mg/dL — ABNORMAL HIGH (ref 0.76–1.27)
Globulin, Total: 3.3 g/dL (ref 1.5–4.5)
Glucose: 102 mg/dL — ABNORMAL HIGH (ref 65–99)
Potassium: 5 mmol/L (ref 3.5–5.2)
Sodium: 138 mmol/L (ref 134–144)
Total Protein: 7.4 g/dL (ref 6.0–8.5)
eGFR: 48 mL/min/{1.73_m2} — ABNORMAL LOW (ref >59)

## 2021-02-06 LAB — TSH: TSH: 2.38 u[IU]/mL (ref 0.450–4.500)

## 2021-02-07 ENCOUNTER — Ambulatory Visit: Payer: Medicare HMO | Admitting: Adult Health

## 2021-02-08 ENCOUNTER — Encounter: Payer: Self-pay | Admitting: *Deleted

## 2021-03-02 ENCOUNTER — Other Ambulatory Visit: Payer: Self-pay | Admitting: Cardiovascular Disease

## 2021-03-31 ENCOUNTER — Inpatient Hospital Stay (HOSPITAL_COMMUNITY)
Admission: EM | Admit: 2021-03-31 | Discharge: 2021-04-03 | DRG: 280 | Disposition: A | Discharge status: Home / Self Care | Payer: Medicare HMO | Attending: Interventional Cardiology | Admitting: Interventional Cardiology

## 2021-03-31 ENCOUNTER — Inpatient Hospital Stay (HOSPITAL_COMMUNITY): Payer: Medicare HMO

## 2021-03-31 ENCOUNTER — Encounter (HOSPITAL_COMMUNITY)
Admission: EM | Disposition: A | Discharge status: Home / Self Care | Payer: Self-pay | Source: Home / Self Care | Attending: Cardiology

## 2021-03-31 ENCOUNTER — Other Ambulatory Visit: Payer: Self-pay

## 2021-03-31 ENCOUNTER — Emergency Department (HOSPITAL_COMMUNITY): Payer: Medicare HMO

## 2021-03-31 ENCOUNTER — Encounter (HOSPITAL_COMMUNITY): Payer: Self-pay

## 2021-03-31 DIAGNOSIS — Z8249 Family history of ischemic heart disease and other diseases of the circulatory system: Secondary | ICD-10-CM | POA: Diagnosis not present

## 2021-03-31 DIAGNOSIS — I255 Ischemic cardiomyopathy: Secondary | ICD-10-CM | POA: Diagnosis not present

## 2021-03-31 DIAGNOSIS — Z79899 Other long term (current) drug therapy: Secondary | ICD-10-CM | POA: Diagnosis not present

## 2021-03-31 DIAGNOSIS — Z951 Presence of aortocoronary bypass graft: Secondary | ICD-10-CM

## 2021-03-31 DIAGNOSIS — Z8673 Personal history of transient ischemic attack (TIA), and cerebral infarction without residual deficits: Secondary | ICD-10-CM

## 2021-03-31 DIAGNOSIS — F32A Depression, unspecified: Secondary | ICD-10-CM | POA: Diagnosis not present

## 2021-03-31 DIAGNOSIS — Z955 Presence of coronary angioplasty implant and graft: Secondary | ICD-10-CM | POA: Diagnosis not present

## 2021-03-31 DIAGNOSIS — I517 Cardiomegaly: Secondary | ICD-10-CM | POA: Diagnosis not present

## 2021-03-31 DIAGNOSIS — I10 Essential (primary) hypertension: Secondary | ICD-10-CM

## 2021-03-31 DIAGNOSIS — Z87891 Personal history of nicotine dependence: Secondary | ICD-10-CM

## 2021-03-31 DIAGNOSIS — Z809 Family history of malignant neoplasm, unspecified: Secondary | ICD-10-CM

## 2021-03-31 DIAGNOSIS — Z9861 Coronary angioplasty status: Secondary | ICD-10-CM | POA: Diagnosis not present

## 2021-03-31 DIAGNOSIS — N1832 Chronic kidney disease, stage 3b: Secondary | ICD-10-CM | POA: Diagnosis present

## 2021-03-31 DIAGNOSIS — E785 Hyperlipidemia, unspecified: Secondary | ICD-10-CM | POA: Diagnosis not present

## 2021-03-31 DIAGNOSIS — Z7901 Long term (current) use of anticoagulants: Secondary | ICD-10-CM | POA: Diagnosis not present

## 2021-03-31 DIAGNOSIS — I2119 ST elevation (STEMI) myocardial infarction involving other coronary artery of inferior wall: Secondary | ICD-10-CM

## 2021-03-31 DIAGNOSIS — I2510 Atherosclerotic heart disease of native coronary artery without angina pectoris: Secondary | ICD-10-CM | POA: Diagnosis present

## 2021-03-31 DIAGNOSIS — Z79890 Hormone replacement therapy (postmenopausal): Secondary | ICD-10-CM | POA: Diagnosis not present

## 2021-03-31 DIAGNOSIS — I472 Ventricular tachycardia: Secondary | ICD-10-CM | POA: Diagnosis not present

## 2021-03-31 DIAGNOSIS — R509 Fever, unspecified: Secondary | ICD-10-CM | POA: Diagnosis not present

## 2021-03-31 DIAGNOSIS — I5043 Acute on chronic combined systolic (congestive) and diastolic (congestive) heart failure: Secondary | ICD-10-CM | POA: Diagnosis not present

## 2021-03-31 DIAGNOSIS — Z83438 Family history of other disorder of lipoprotein metabolism and other lipidemia: Secondary | ICD-10-CM

## 2021-03-31 DIAGNOSIS — Z86711 Personal history of pulmonary embolism: Secondary | ICD-10-CM | POA: Diagnosis not present

## 2021-03-31 DIAGNOSIS — I340 Nonrheumatic mitral (valve) insufficiency: Secondary | ICD-10-CM | POA: Diagnosis not present

## 2021-03-31 DIAGNOSIS — I252 Old myocardial infarction: Secondary | ICD-10-CM

## 2021-03-31 DIAGNOSIS — R079 Chest pain, unspecified: Secondary | ICD-10-CM | POA: Diagnosis not present

## 2021-03-31 DIAGNOSIS — Z95810 Presence of automatic (implantable) cardiac defibrillator: Secondary | ICD-10-CM

## 2021-03-31 DIAGNOSIS — E782 Mixed hyperlipidemia: Secondary | ICD-10-CM | POA: Diagnosis present

## 2021-03-31 DIAGNOSIS — I130 Hypertensive heart and chronic kidney disease with heart failure and stage 1 through stage 4 chronic kidney disease, or unspecified chronic kidney disease: Secondary | ICD-10-CM | POA: Diagnosis not present

## 2021-03-31 DIAGNOSIS — R0789 Other chest pain: Secondary | ICD-10-CM | POA: Diagnosis not present

## 2021-03-31 DIAGNOSIS — Z7902 Long term (current) use of antithrombotics/antiplatelets: Secondary | ICD-10-CM

## 2021-03-31 DIAGNOSIS — Z20822 Contact with and (suspected) exposure to covid-19: Secondary | ICD-10-CM | POA: Diagnosis not present

## 2021-03-31 DIAGNOSIS — I213 ST elevation (STEMI) myocardial infarction of unspecified site: Secondary | ICD-10-CM | POA: Diagnosis not present

## 2021-03-31 DIAGNOSIS — I480 Paroxysmal atrial fibrillation: Secondary | ICD-10-CM | POA: Diagnosis present

## 2021-03-31 DIAGNOSIS — I25810 Atherosclerosis of coronary artery bypass graft(s) without angina pectoris: Secondary | ICD-10-CM | POA: Diagnosis not present

## 2021-03-31 DIAGNOSIS — I5042 Chronic combined systolic (congestive) and diastolic (congestive) heart failure: Secondary | ICD-10-CM | POA: Diagnosis present

## 2021-03-31 DIAGNOSIS — I491 Atrial premature depolarization: Secondary | ICD-10-CM | POA: Diagnosis not present

## 2021-03-31 DIAGNOSIS — I5022 Chronic systolic (congestive) heart failure: Secondary | ICD-10-CM | POA: Diagnosis not present

## 2021-03-31 HISTORY — DX: ST elevation (STEMI) myocardial infarction involving other coronary artery of inferior wall: I21.19

## 2021-03-31 HISTORY — PX: LEFT HEART CATH AND CORS/GRAFTS ANGIOGRAPHY: CATH118250

## 2021-03-31 LAB — COMPREHENSIVE METABOLIC PANEL
ALT: 23 U/L (ref 0–44)
AST: 36 U/L (ref 15–41)
Albumin: 4 g/dL (ref 3.5–5.0)
Alkaline Phosphatase: 72 U/L (ref 38–126)
Anion gap: 12 (ref 5–15)
BUN: 25 mg/dL — ABNORMAL HIGH (ref 8–23)
CO2: 23 mmol/L (ref 22–32)
Calcium: 9.3 mg/dL (ref 8.9–10.3)
Chloride: 105 mmol/L (ref 98–111)
Creatinine, Ser: 1.68 mg/dL — ABNORMAL HIGH (ref 0.61–1.24)
GFR, Estimated: 45 mL/min — ABNORMAL LOW (ref >60)
Glucose, Bld: 108 mg/dL — ABNORMAL HIGH (ref 70–99)
Potassium: 4.3 mmol/L (ref 3.5–5.1)
Sodium: 140 mmol/L (ref 135–145)
Total Bilirubin: 1.1 mg/dL (ref 0.3–1.2)
Total Protein: 7.5 g/dL (ref 6.5–8.1)

## 2021-03-31 LAB — POCT I-STAT, CHEM 8
BUN: 26 mg/dL — ABNORMAL HIGH (ref 8–23)
Calcium, Ion: 1.16 mmol/L (ref 1.15–1.40)
Chloride: 107 mmol/L (ref 98–111)
Creatinine, Ser: 1.6 mg/dL — ABNORMAL HIGH (ref 0.61–1.24)
Glucose, Bld: 112 mg/dL — ABNORMAL HIGH (ref 70–99)
HCT: 50 % (ref 39.0–52.0)
Hemoglobin: 17 g/dL (ref 13.0–17.0)
Potassium: 4 mmol/L (ref 3.5–5.1)
Sodium: 141 mmol/L (ref 135–145)
TCO2: 24 mmol/L (ref 22–32)

## 2021-03-31 LAB — LIPID PANEL
Cholesterol: 168 mg/dL (ref 0–200)
HDL: 30 mg/dL — ABNORMAL LOW (ref >40)
LDL Cholesterol: 61 mg/dL (ref 0–99)
Total CHOL/HDL Ratio: 5.6 RATIO
Triglycerides: 385 mg/dL — ABNORMAL HIGH (ref <150)
VLDL: 77 mg/dL — ABNORMAL HIGH (ref 0–40)

## 2021-03-31 LAB — CBC
HCT: 50.3 % (ref 39.0–52.0)
Hemoglobin: 16.2 g/dL (ref 13.0–17.0)
MCH: 30.5 pg (ref 26.0–34.0)
MCHC: 32.2 g/dL (ref 30.0–36.0)
MCV: 94.5 fL (ref 80.0–100.0)
Platelets: 323 10*3/uL (ref 150–400)
RBC: 5.32 MIL/uL (ref 4.22–5.81)
RDW: 13.6 % (ref 11.5–15.5)
WBC: 15.3 10*3/uL — ABNORMAL HIGH (ref 4.0–10.5)
nRBC: 0 % (ref 0.0–0.2)

## 2021-03-31 LAB — PROTIME-INR
INR: 1 (ref 0.8–1.2)
Prothrombin Time: 13.6 seconds (ref 11.4–15.2)

## 2021-03-31 LAB — POCT ACTIVATED CLOTTING TIME
Activated Clotting Time: 213 seconds
Activated Clotting Time: 121 seconds
Activated Clotting Time: 121 seconds

## 2021-03-31 LAB — TROPONIN I (HIGH SENSITIVITY)
Troponin I (High Sensitivity): 261 ng/L (ref <18)
Troponin I (High Sensitivity): 3554 ng/L (ref <18)
Troponin I (High Sensitivity): 12036 ng/L (ref <18)
Troponin I (High Sensitivity): >24000 ng/L (ref <18)

## 2021-03-31 LAB — RESP PANEL BY RT-PCR (FLU A&B, COVID) ARPGX2
Influenza A by PCR: NEGATIVE
Influenza B by PCR: NEGATIVE
SARS Coronavirus 2 by RT PCR: NEGATIVE

## 2021-03-31 LAB — MRSA NEXT GEN BY PCR, NASAL: MRSA by PCR Next Gen: NOT DETECTED

## 2021-03-31 LAB — APTT: aPTT: 29 seconds (ref 24–36)

## 2021-03-31 LAB — HEMOGLOBIN A1C
Hgb A1c MFr Bld: 5.7 % — ABNORMAL HIGH (ref 4.8–5.6)
Mean Plasma Glucose: 116.89 mg/dL

## 2021-03-31 SURGERY — LEFT HEART CATH AND CORS/GRAFTS ANGIOGRAPHY
Anesthesia: LOCAL

## 2021-03-31 MED ORDER — ATORVASTATIN CALCIUM 80 MG PO TABS
80.0000 mg | ORAL_TABLET | Freq: Every day | ORAL | Status: DC
  Administered 2021-03-31 – 2021-04-03 (×4): 80 mg via ORAL
  Filled 2021-03-31 (×4): qty 1

## 2021-03-31 MED ORDER — MELATONIN 3 MG PO TABS
3.0000 mg | ORAL_TABLET | Freq: Every day | ORAL | Status: DC
  Administered 2021-03-31 – 2021-04-02 (×3): 3 mg via ORAL
  Filled 2021-03-31 (×3): qty 1

## 2021-03-31 MED ORDER — LIDOCAINE HCL (PF) 1 % IJ SOLN
INTRAMUSCULAR | Status: AC
  Filled 2021-03-31: qty 30

## 2021-03-31 MED ORDER — ESCITALOPRAM OXALATE 10 MG PO TABS
10.0000 mg | ORAL_TABLET | Freq: Every day | ORAL | Status: DC
  Administered 2021-03-31 – 2021-04-03 (×4): 10 mg via ORAL
  Filled 2021-03-31 (×4): qty 1

## 2021-03-31 MED ORDER — SODIUM CHLORIDE 0.9% FLUSH: 3.0000 mL | INTRAVENOUS | Status: DC | PRN

## 2021-03-31 MED ORDER — SODIUM CHLORIDE 0.9 % IV SOLN
INTRAVENOUS | Status: AC | PRN
  Administered 2021-03-31: 10 mL/h via INTRAVENOUS

## 2021-03-31 MED ORDER — HEPARIN (PORCINE) IN NACL 1000-0.9 UT/500ML-% IV SOLN
INTRAVENOUS | Status: DC | PRN
  Administered 2021-03-31 (×2): 500 mL

## 2021-03-31 MED ORDER — HEPARIN SODIUM (PORCINE) 1000 UNIT/ML IJ SOLN
INTRAMUSCULAR | Status: AC
  Filled 2021-03-31: qty 1

## 2021-03-31 MED ORDER — SODIUM CHLORIDE 0.9 % IV SOLN: 250.0000 mL | INTRAVENOUS | Status: DC | PRN

## 2021-03-31 MED ORDER — MORPHINE SULFATE (PF) 2 MG/ML IV SOLN
2.0000 mg | INTRAVENOUS | Status: DC | PRN
  Administered 2021-03-31 (×4): 2 mg via INTRAVENOUS
  Filled 2021-03-31 (×4): qty 1

## 2021-03-31 MED ORDER — SODIUM CHLORIDE 0.9% FLUSH
3.0000 mL | Freq: Two times a day (BID) | INTRAVENOUS | Status: DC
  Administered 2021-03-31 – 2021-04-03 (×6): 3 mL via INTRAVENOUS

## 2021-03-31 MED ORDER — FUROSEMIDE 10 MG/ML IJ SOLN
40.0000 mg | Freq: Once | INTRAMUSCULAR | Status: AC
  Administered 2021-03-31: 40 mg via INTRAVENOUS
  Filled 2021-03-31: qty 4

## 2021-03-31 MED ORDER — HEPARIN (PORCINE) IN NACL 1000-0.9 UT/500ML-% IV SOLN
INTRAVENOUS | Status: AC
  Filled 2021-03-31: qty 1000

## 2021-03-31 MED ORDER — HYDRALAZINE HCL 20 MG/ML IJ SOLN: 10.0000 mg | INTRAMUSCULAR | Status: AC | PRN

## 2021-03-31 MED ORDER — ACETAMINOPHEN 325 MG PO TABS
650.0000 mg | ORAL_TABLET | ORAL | Status: DC | PRN
  Administered 2021-03-31 – 2021-04-01 (×3): 650 mg via ORAL
  Filled 2021-03-31 (×3): qty 2

## 2021-03-31 MED ORDER — HEPARIN SODIUM (PORCINE) 5000 UNIT/ML IJ SOLN: 4000.0000 [IU] | Freq: Once | INTRAMUSCULAR | Status: DC

## 2021-03-31 MED ORDER — FENTANYL CITRATE (PF) 100 MCG/2ML IJ SOLN
INTRAMUSCULAR | Status: AC
  Filled 2021-03-31: qty 2

## 2021-03-31 MED ORDER — LABETALOL HCL 5 MG/ML IV SOLN
10.0000 mg | INTRAVENOUS | Status: AC | PRN
  Administered 2021-03-31 (×2): 10 mg via INTRAVENOUS
  Filled 2021-03-31: qty 4

## 2021-03-31 MED ORDER — ONDANSETRON HCL 4 MG/2ML IJ SOLN
4.0000 mg | Freq: Four times a day (QID) | INTRAMUSCULAR | Status: DC | PRN
  Administered 2021-03-31: 4 mg via INTRAVENOUS
  Filled 2021-03-31: qty 2

## 2021-03-31 MED ORDER — MIDAZOLAM HCL 2 MG/2ML IJ SOLN
INTRAMUSCULAR | Status: AC
  Filled 2021-03-31: qty 2

## 2021-03-31 MED ORDER — CLOPIDOGREL BISULFATE 300 MG PO TABS
300.0000 mg | ORAL_TABLET | Freq: Once | ORAL | Status: AC
  Administered 2021-03-31: 300 mg via ORAL
  Filled 2021-03-31: qty 1

## 2021-03-31 MED ORDER — ONE-A-DAY MENS 50+ ADVANTAGE PO TABS: 1.0000 | ORAL_TABLET | Freq: Every day | ORAL | Status: DC

## 2021-03-31 MED ORDER — NITROGLYCERIN 0.4 MG SL SUBL: 0.4000 mg | SUBLINGUAL_TABLET | SUBLINGUAL | Status: DC | PRN

## 2021-03-31 MED ORDER — FENTANYL CITRATE (PF) 100 MCG/2ML IJ SOLN
INTRAMUSCULAR | Status: DC | PRN
  Administered 2021-03-31: 25 ug via INTRAVENOUS

## 2021-03-31 MED ORDER — CHLORHEXIDINE GLUCONATE CLOTH 2 % EX PADS
6.0000 | MEDICATED_PAD | Freq: Every day | CUTANEOUS | Status: DC
  Administered 2021-04-01 – 2021-04-03 (×4): 6 via TOPICAL

## 2021-03-31 MED ORDER — HEPARIN SODIUM (PORCINE) 1000 UNIT/ML IJ SOLN
INTRAMUSCULAR | Status: DC | PRN
  Administered 2021-03-31: 8000 [IU] via INTRAVENOUS

## 2021-03-31 MED ORDER — MIDAZOLAM HCL 2 MG/2ML IJ SOLN
INTRAMUSCULAR | Status: DC | PRN
  Administered 2021-03-31: 1 mg via INTRAVENOUS

## 2021-03-31 MED ORDER — NITROGLYCERIN 1 MG/10 ML FOR IR/CATH LAB
INTRA_ARTERIAL | Status: AC
  Filled 2021-03-31: qty 10

## 2021-03-31 MED ORDER — IOHEXOL 350 MG/ML SOLN
INTRAVENOUS | Status: DC | PRN
  Administered 2021-03-31: 100 mL via INTRA_ARTERIAL

## 2021-03-31 MED ORDER — HEPARIN (PORCINE) 25000 UT/250ML-% IV SOLN
1400.0000 [IU]/h | INTRAVENOUS | Status: DC
  Administered 2021-03-31: 1200 [IU]/h via INTRAVENOUS
  Administered 2021-04-01 – 2021-04-02 (×2): 1400 [IU]/h via INTRAVENOUS
  Filled 2021-03-31 (×3): qty 250

## 2021-03-31 MED ORDER — ATROPINE SULFATE 1 MG/10ML IJ SOSY
PREFILLED_SYRINGE | INTRAMUSCULAR | Status: AC
  Filled 2021-03-31: qty 10

## 2021-03-31 MED ORDER — SODIUM CHLORIDE 0.9 % IV SOLN: INTRAVENOUS | Status: DC

## 2021-03-31 MED ORDER — EZETIMIBE 10 MG PO TABS
10.0000 mg | ORAL_TABLET | Freq: Every day | ORAL | Status: DC
  Administered 2021-03-31 – 2021-04-03 (×4): 10 mg via ORAL
  Filled 2021-03-31 (×4): qty 1

## 2021-03-31 MED ORDER — TIROFIBAN HCL IN NACL 5-0.9 MG/100ML-% IV SOLN
0.0750 ug/kg/min | INTRAVENOUS | Status: AC
  Administered 2021-03-31: 0.075 ug/kg/min via INTRAVENOUS
  Filled 2021-03-31: qty 100

## 2021-03-31 MED ORDER — HYDRALAZINE HCL 10 MG PO TABS
10.0000 mg | ORAL_TABLET | Freq: Three times a day (TID) | ORAL | Status: DC
  Administered 2021-03-31 – 2021-04-02 (×6): 10 mg via ORAL
  Filled 2021-03-31 (×6): qty 1

## 2021-03-31 MED ORDER — AMIODARONE HCL 200 MG PO TABS
200.0000 mg | ORAL_TABLET | Freq: Two times a day (BID) | ORAL | Status: DC
  Administered 2021-03-31 – 2021-04-03 (×7): 200 mg via ORAL
  Filled 2021-03-31 (×7): qty 1

## 2021-03-31 MED ORDER — TIROFIBAN (AGGRASTAT) BOLUS VIA INFUSION
INTRAVENOUS | Status: DC | PRN
  Administered 2021-03-31: 2267.5 ug via INTRAVENOUS

## 2021-03-31 MED ORDER — SODIUM CHLORIDE 0.9 % IV SOLN: INTRAVENOUS | Status: AC

## 2021-03-31 MED ORDER — ASPIRIN EC 81 MG PO TBEC
81.0000 mg | DELAYED_RELEASE_TABLET | Freq: Every day | ORAL | Status: DC
  Administered 2021-04-01 – 2021-04-03 (×3): 81 mg via ORAL
  Filled 2021-03-31 (×3): qty 1

## 2021-03-31 MED ORDER — LIDOCAINE HCL (PF) 1 % IJ SOLN
INTRAMUSCULAR | Status: DC | PRN
  Administered 2021-03-31: 15 mL via SUBCUTANEOUS

## 2021-03-31 MED ORDER — SACUBITRIL-VALSARTAN 24-26 MG PO TABS
1.0000 | ORAL_TABLET | Freq: Two times a day (BID) | ORAL | Status: DC
  Administered 2021-03-31 – 2021-04-03 (×6): 1 via ORAL
  Filled 2021-03-31 (×8): qty 1

## 2021-03-31 MED ORDER — METOPROLOL SUCCINATE ER 25 MG PO TB24
25.0000 mg | ORAL_TABLET | Freq: Every day | ORAL | Status: DC
  Administered 2021-03-31 – 2021-04-02 (×3): 25 mg via ORAL
  Filled 2021-03-31 (×4): qty 1

## 2021-03-31 MED ORDER — ASPIRIN 81 MG PO CHEW: 324.0000 mg | CHEWABLE_TABLET | Freq: Once | ORAL | Status: DC

## 2021-03-31 MED ORDER — BIVALIRUDIN TRIFLUOROACETATE 250 MG IV SOLR
INTRAVENOUS | Status: AC
  Filled 2021-03-31: qty 250

## 2021-03-31 MED ORDER — CLOPIDOGREL BISULFATE 75 MG PO TABS
75.0000 mg | ORAL_TABLET | Freq: Every day | ORAL | Status: DC
  Administered 2021-04-01 – 2021-04-03 (×3): 75 mg via ORAL
  Filled 2021-03-31 (×4): qty 1

## 2021-03-31 SURGICAL SUPPLY — 12 items
CATH INFINITI 5 FR LCB (CATHETERS) ×2 IMPLANT
CATH INFINITI 5FR MPB2 (CATHETERS) ×2 IMPLANT
CATH INFINITI 5FR MULTPACK ANG (CATHETERS) ×2 IMPLANT
GLIDESHEATH SLEND SS 6F .021 (SHEATH) ×2 IMPLANT
GUIDEWIRE INQWIRE 1.5J.035X260 (WIRE) ×1 IMPLANT
INQWIRE 1.5J .035X260CM (WIRE) ×2
KIT HEART LEFT (KITS) ×2 IMPLANT
PACK CARDIAC CATHETERIZATION (CUSTOM PROCEDURE TRAY) ×2 IMPLANT
SHEATH PINNACLE 6F 10CM (SHEATH) ×2 IMPLANT
SYR MEDRAD MARK 7 150ML (SYRINGE) ×2 IMPLANT
TRANSDUCER W/STOPCOCK (MISCELLANEOUS) ×2 IMPLANT
TUBING CIL FLEX 10 FLL-RA (TUBING) ×2 IMPLANT

## 2021-03-31 NOTE — Progress Notes (Signed)
[place] for heparin + tirofiban Indication: chest pain/ACS and atrial fibrillation  No Known Allergies  Patient Measurements: Height: '5\' 7"'$  (170.2 cm) Weight: 90.7 kg (200 lb) IBW/kg (Calculated) : 66.1 Heparin Dosing Weight: 90kg  Vital Signs: Temp: 98.4 F (36.9 C) (09/17 0745) Temp Source: Oral (09/17 0745) BP: 135/94 (09/17 0848) Pulse Rate: 0 (09/17 0903)  Labs: Recent Labs    03/31/21 0752 03/31/21 0756  HGB 16.2 17.0  HCT 50.3 50.0  PLT 323  --   APTT 29  --   LABPROT 13.6  --   INR 1.0  --   CREATININE 1.68* 1.60*  TROPONINIHS 261*  --     Estimated Creatinine Clearance: 48.8 mL/min (A) (by C-G formula based on SCr of 1.6 mg/dL (H)).   Medical History: Past Medical History:  Diagnosis Date   Borderline diabetic    Coronary artery disease    a. s/p CABG in 2014 with LIMA-LAD, SVG-OM2 and SVG-RC b. 03/2019: STEMI and s/p angioplasty to proximal SVG-RCA and DES to SVG-RCA anastomosis c. 04/2020: recurrent STEMI with angioplasty of SVG-RCA   Dyslipidemia 01/11/2013   Hiatal hernia    Hypertension    Mini stroke ([place])    2   S/P CABG x 3    STEMI (ST elevation myocardial infarction) ([place])    Stroke [facility])     Assessment: 20 yoM admitted as code STEMI found to have thrombotic occlusion within SVG not amenable to PCI. Pt on apixaban PTA for hx AF, last dose 9/16 pm. Tirofiban started in cath lab, to continue x12h. Heparin to start 8h after sheath removal with plans for staged angiography next week. Glycoprotein inhibitor will be discontinued at the time of first heparin levels so will not target lower goal, likely will need to use aPTT with recent apixaban exposure.  Goal of Therapy:  Heparin level 0.3-0.7 units/ml aPTT 66-102 seconds Monitor platelets by anticoagulation protocol: Yes   Plan:  -Tirofiban 0.075 mcg/kg/min x12h -Heparin 1200 units/h no bolus at 1700 -Check aPTT and heparin level 6h after  starting  Arrie Senate, PharmD, BCPS, [facility] Clinical Pharmacist [phone] Please check AMION for all [place] numbers 03/31/2021

## 2021-03-31 NOTE — ED Provider Notes (Signed)
[facility] EMERGENCY DEPARTMENT Provider Note   CSN: [id number] Arrival date & time: 03/31/21  [id number]     History Chief Complaint  Patient presents with   Code STEMI    [name] is a 65 y.o. male.  HPI Level 5 caveat secondary to severity of disease History obtained from patient and EMS Patient activated as code STEMI prehospital 65 year old man history of coronary artery disease presents this morning as code STEMI.  Patient reports that he began having pain around 5 AM.  It was severe at 10 out of 10 initially.  Pain was similar to prior MIs.  Pain is now decreased to 2 out of 10.  Patient took aspirin at home.  EMS reports significant ST depression in V2 through V4.  Posterior leads were obtained and showed ST elevation He reports some associated symptoms of diaphoresis, but denies dyspnea, sensation of his defibrillator firing, or lightheadedness.     Past Medical History:  Diagnosis Date   Borderline diabetic    Coronary artery disease    a. s/p CABG in 2014 with LIMA-LAD, SVG-OM2 and SVG-RC b. 03/2019: STEMI and s/p angioplasty to proximal SVG-RCA and DES to SVG-RCA anastomosis c. 04/2020: recurrent STEMI with angioplasty of SVG-RCA   Dyslipidemia 01/11/2013   Hiatal hernia    Hypertension    Mini stroke ([place])    2   S/P CABG x 3    STEMI (ST elevation myocardial infarction) ([place])    Stroke [facility])     Patient Active Problem List   Diagnosis Date Noted   NYHA class 1 heart failure with reduced ejection fraction (HCC)    Bradycardia    AKI (acute kidney injury) ([place])    Leukocytosis    Right hemiparesis ([place])    Recurrent ventricular tachycardia ([place])    Transaminitis    Sleep disturbance    Debility 06/25/2020   Biventricular heart failure ([place])    Thoracic aortic aneurysm without rupture (HCC)    Pressure injury of skin 06/21/2020   Cardiac defibrillator in situ    Cerebral infarction [facility])    Acute pulmonary embolism without acute cor  pulmonale (HCC)    Cardiogenic shock (HCC)    Ventricular tachycardia ([place]) 06/04/2020   NSVT (nonsustained ventricular tachycardia) ([place]) 05/26/2020   Heart attack ([place]) 05/04/2020   Chronic combined systolic and diastolic heart failure ([place]) 06/25/2019   Dilated aortic root ([place]) 06/25/2019   CAD S/P percutaneous coronary angioplasty 04/22/2019   Ischemic cardiomyopathy 04/22/2019   Aortic root enlargement ([place]) 04/22/2019   Stage 3a chronic kidney disease ([place]) 04/22/2019   Hyperkalemia 04/22/2019   Respiratory failure with hypoxia ([place])    Altered mental status 03/23/2019   Status post CVA 02/03/2013   Weakness 01/11/2013   Dyslipidemia (high LDL; low HDL) 01/11/2013   Hx of CABG 09/02/2012   ST elevation myocardial infarction (STEMI) ([place]) 09/01/2012   Essential hypertension    Prediabetes    Hiatal hernia     Past Surgical History:  Procedure Laterality Date   CARDIAC CATHETERIZATION     CORONARY ARTERY BYPASS GRAFT N/A 09/01/2012   Procedure: CORONARY ARTERY BYPASS GRAFTING (CABG);  Surgeon: Gaye Pollack, MD;  Location: [facility];  Service: Open Heart Surgery;  Laterality: N/A;  Coronary Artery Bypass Grafting Times Three Using Left Internal Mammary Artery and Left Saphenous leg Vein Harvested Endoscopically   CORONARY STENT INTERVENTION N/A 03/23/2019   Procedure: CORONARY STENT INTERVENTION;  Surgeon: Lorretta Harp, MD;  Location:  [place] INVASIVE CV LAB;  Service: Cardiovascular;  Laterality: N/A;   CORONARY/GRAFT ACUTE MI REVASCULARIZATION N/A 03/23/2019   Procedure: Coronary/Graft Acute MI Revascularization;  Surgeon: Lorretta Harp, MD;  Location: [place] CV LAB;  Service: Cardiovascular;  Laterality: N/A;   CORONARY/GRAFT ACUTE MI REVASCULARIZATION N/A 05/03/2020   Procedure: Coronary/Graft Acute MI Revascularization;  Surgeon: Belva Crome, MD;  Location: [place] CV LAB;  Service: Cardiovascular;  Laterality: N/A;   IABP INSERTION N/A 06/04/2020   Procedure:  IABP Insertion;  Surgeon: Martinique, Peter M, MD;  Location: [place] CV LAB;  Service: Cardiovascular;  Laterality: N/A;   ICD IMPLANT N/A 06/19/2020   Procedure: ICD IMPLANT;  Surgeon: Vickie Epley, MD;  Location: [place] CV LAB;  Service: Cardiovascular;  Laterality: N/A;   INTRAOPERATIVE TRANSESOPHAGEAL ECHOCARDIOGRAM N/A 09/01/2012   Procedure: INTRAOPERATIVE TRANSESOPHAGEAL ECHOCARDIOGRAM;  Surgeon: Gaye Pollack, MD;  Location: [facility] OR;  Service: Open Heart Surgery;  Laterality: N/A;   LEFT HEART CATH AND CORONARY ANGIOGRAPHY N/A 03/23/2019   Procedure: LEFT HEART CATH AND CORONARY ANGIOGRAPHY;  Surgeon: Lorretta Harp, MD;  Location: [place] CV LAB;  Service: Cardiovascular;  Laterality: N/A;   LEFT HEART CATH AND CORONARY ANGIOGRAPHY N/A 05/03/2020   Procedure: LEFT HEART CATH AND CORONARY ANGIOGRAPHY;  Surgeon: Belva Crome, MD;  Location: [place] CV LAB;  Service: Cardiovascular;  Laterality: N/A;   LEFT HEART CATHETERIZATION WITH CORONARY ANGIOGRAM N/A 08/31/2012   Procedure: LEFT HEART CATHETERIZATION WITH CORONARY ANGIOGRAM;  Surgeon: Peter M Martinique, MD;  Location: [facility] CATH LAB;  Service: Cardiovascular;  Laterality: N/A;   PERCUTANEOUS CORONARY STENT INTERVENTION (PCI-S) N/A 08/31/2012   Procedure: PERCUTANEOUS CORONARY STENT INTERVENTION (PCI-S);  Surgeon: Peter M Martinique, MD;  Location[facility] CATH LAB;  Service: Cardiovascular;  Laterality: N/A;   RIGHT/LEFT HEART CATH AND CORONARY/GRAFT ANGIOGRAPHY N/A 06/04/2020   Procedure: RIGHT/LEFT HEART CATH AND CORONARY/GRAFT ANGIOGRAPHY;  Surgeon: Martinique, Peter M, MD;  Location: [place] CV LAB;  Service: Cardiovascular;  Laterality: N/A;   TEMPORARY PACEMAKER N/A 06/04/2020   Procedure: TEMPORARY PACEMAKER;  Surgeon: Martinique, Peter M, MD;  Location: [place] CV LAB;  Service: Cardiovascular;  Laterality: N/A;       Family History  Problem Relation Age of Onset   Cancer Father    Hypertension Father    Hyperlipidemia  Father    Heart attack Father    Hypertension Sister    Hypertension Brother    Hyperlipidemia Brother    Heart attack Sister    Heart attack Brother     Social History   Tobacco Use   Smoking status: Former    Types: Pipe   Smokeless tobacco: Former    Quit date: 07/15/1968  Vaping Use   Vaping Use: Never used  Substance Use Topics   Alcohol use: No   Drug use: No    Home Medications Prior to Admission medications   Medication Sig Start Date End Date Taking? Authorizing Provider  acetaminophen (TYLENOL) 325 MG tablet Take 2 tablets (650 mg total) by mouth every 4 (four) hours as needed for mild pain or headache. 06/29/20   Angiulli, Lavon Paganini, PA-C  amiodarone (PACERONE) 200 MG tablet TAKE 1 TABLET BY MOUTH 2 TIMES DAILY. 03/05/21   Croitoru, Mihai, MD  apixaban (ELIQUIS) 5 MG TABS tablet Take 1 tablet (5 mg total) by mouth 2 (two) times daily. 09/06/20   Croitoru, Mihai, MD  atorvastatin (LIPITOR) 80 MG tablet TAKE 1 TABLET BY MOUTH  DAILY AT 6 PM. 06/29/20   Angiulli, Lavon Paganini, PA-C  clopidogrel (PLAVIX) 75 MG tablet Take 1 tablet (75 mg total) by mouth daily. 06/29/20   Angiulli, Lavon Paganini, PA-C  escitalopram (LEXAPRO) 10 MG tablet Take 1 tablet (10 mg total) by mouth daily. 06/29/20   Angiulli, Lavon Paganini, PA-C  ezetimibe (ZETIA) 10 MG tablet Take 1 tablet (10 mg total) by mouth daily. 06/29/20 09/27/20  Angiulli, Lavon Paganini, PA-C  hydrALAZINE (APRESOLINE) 10 MG tablet Take 1 tablet (10 mg total) by mouth every 8 (eight) hours. 09/19/20   Vickie Epley, MD  melatonin 3 MG TABS tablet Take 1 tablet (3 mg total) by mouth at bedtime. 08/01/20   Croitoru, Mihai, MD  metoprolol succinate (TOPROL-XL) 25 MG 24 hr tablet Take 1 tablet (25 mg total) by mouth daily. 08/22/20   Croitoru, Mihai, MD  Multiple Vitamins-Minerals (ONE-A-DAY MENS 50+ ADVANTAGE) TABS Take 1 tablet by mouth daily with breakfast.    [provider]  nitroGLYCERIN (NITROSTAT) 0.4 MG SL tablet Place 1 tablet (0.4 mg  total) under the tongue every 5 (five) minutes x 3 doses as needed for chest pain. 06/29/20   Angiulli, Lavon Paganini, PA-C  sacubitril-valsartan (ENTRESTO) 24-26 MG Take 1 tablet by mouth 2 (two) times daily. 12/21/20   Croitoru, Dani Gobble, MD    Allergies    Patient has no known allergies.  Review of Systems   Review of Systems  All other systems reviewed and are negative.  Physical Exam Updated Vital Signs SpO2 99%   Physical Exam Vitals and nursing note reviewed.  Constitutional:      General: He is not in acute distress.    Appearance: Normal appearance. He is not ill-appearing.  HENT:     Head: Normocephalic.     Right Ear: External ear normal.     Left Ear: External ear normal.     Nose: Nose normal.     Mouth/Throat:     Pharynx: Oropharynx is clear.  Eyes:     Pupils: Pupils are equal, round, and reactive to light.  Cardiovascular:     Rate and Rhythm: Normal rate and regular rhythm.     Pulses: Normal pulses.     Heart sounds: Normal heart sounds.  Pulmonary:     Effort: Pulmonary effort is normal.     Breath sounds: Normal breath sounds.  Abdominal:     General: Abdomen is flat. Bowel sounds are normal.     Palpations: Abdomen is soft.  Musculoskeletal:        General: No swelling. Normal range of motion.     Cervical back: Normal range of motion.     Right lower leg: No edema.     Left lower leg: No edema.  Skin:    General: Skin is warm and dry.  Neurological:     General: No focal deficit present.     Mental Status: He is alert.     Cranial Nerves: No cranial nerve deficit.     Motor: No weakness.     Gait: Gait normal.  Psychiatric:        Mood and Affect: Mood normal.    ED Results / Procedures / Treatments   Labs (all labs ordered are listed, but only abnormal results are displayed) Labs Reviewed - No data to display  EKG None  Radiology No results found.  Procedures Procedures   Medications Ordered in ED Medications - No data to  display  ED Course  I have reviewed the triage vital signs and the nursing notes.  Pertinent labs & imaging results that were available during my care of the patient were reviewed by me and considered in my medical decision making (see chart for details).    MDM Rules/Calculators/A&P                            Final Clinical Impression(s) / ED Diagnoses Final diagnoses:  ST elevation myocardial infarction (STEMI), unspecified artery [facility])    Rx / DC Orders ED Discharge Orders     None        Pattricia Boss, MD 04/01/21 [phone]

## 2021-03-31 NOTE — Progress Notes (Signed)
Rt. Femoral arterial sheath pulled as per protocol. ACT 121. Pressure held for 25 min with no bruising noted and no further bleeding noted.  Patient tolerated well.  Pressure dressing applied and instructions given to not sit head of bed up or try to get up or move leg at this time.  Voices understanding.

## 2021-03-31 NOTE — ED Triage Notes (Signed)
Pt woke up at 5am this morning with pain that started in his back and radiated to his chest. Pt drove himself to a fire station nearby. Pt now states pain is diminished but per EMS 12 lead showed ST elevation.

## 2021-03-31 NOTE — Progress Notes (Signed)
Spiritual Care Note  Responded to Code STEMI, consulting with healthcare providers and then meeting Mr Tina wife [name] and her sister Carlyon Shadow to take them up to the [facility] waiting area while Mr Haataja goes to the cath lab.   Familiar with his cardiac history, [name] and Carlyon Shadow were calm, centered, and appreciative of pastoral support. They are aware of ongoing chaplain availability, should needs arise or circumstances change.   03/31/21 0800  Clinical Encounter Type  Visited With Patient not available;Health care provider;Family  Visit Type  (code stemi)  Stress Factors  Patient Stress Factors Health changes  Family Stress Factors Loss of control   On-call Chaplain Lorrin Jackson, [place] [facility] [place], [facility] chaplain 24/7 pager: [phone]

## 2021-03-31 NOTE — H&P (Signed)
Cardiology Admission History and Physical:   Patient ID: [name] MRN: [id number]; DOB: 05-30-1956   Admission date: 03/31/2021  PCP:  [name], MD   [facility] Providers Cardiologist:  Sanda Klein, MD  Electrophysiologist:  Vickie Epley, MD  {   Chief Complaint: Chest pain-EKG STEMI  Patient Profile:   [name] is a 65 y.o. male with past medical history of multivessel CAD-CABG in February 2014 (Dr. Cyndia Bent: LIMA-LAD, SVG-D RCA, SVG-OM1) with 2 successive inferior STEMI's involving the SVG-RCA (September 2020 PCI of anastomosis and October 2021-PTCA of ISR thrombosis), CHRONIC COMBINED SYSTOLIC AND DIASTOLIC HEART FAILURE I/SCHEMIC CARDIOMYOPATHY with EF of 25 to 30% by ECHO, PAF, history of PE, HTN and HLD who is being seen a.m. 03/31/2021 for the evaluation of inferior ST elevation MI/code STEMI.  History of Present Illness:   [name] was in his usual state of health feeling relatively well up until the morning of 03/31/2021 when he awoke at roughly 4 to 4:30 in the morning with severe 8-10 out of 10 chest and back pain with some mild nausea and dyspnea.  He initially did not want to go to the emergency room or call EMS.  Finally after several hours, the wife convinced him to go to the local fire department where they did an EKG showing possible inferior STEMI.  EMS was contacted, and code STEMI was called.  Upon arrival to the [facility] emergency room, he was noting minimal 2/10 chest pain that resolved shortly after arrival.  He is not aware if he received any aspirin or other medications in the morning via EMS, but has not taken any of his a.m. medications.  Last dose of Eliquis was yesterday evening.  (03/30/2021).  About the only thing that the wife notes is that he has been less active than usual over the last several months, sleeps a lot during the day, and has not mentioned anything like recurrent chest discomfort or dyspnea, worsening exertional  dyspnea, PND or orthopnea.  He may feel some skipped beats here and there, but has not noted any prolonged episodes of rapid irregular heartbeat/palpitations.  No syncope or near syncope.  No TIA or amaurosis fugax symptoms.   Past Medical History:  Diagnosis Date   Borderline diabetic    Coronary artery disease    a. s/p CABG in 2014 with LIMA-LAD, SVG-OM2 and SVG-RC b. 03/2019: STEMI and s/p angioplasty to proximal SVG-RCA and DES to SVG-RCA anastomosis c. 04/2020: recurrent STEMI with angioplasty of SVG-RCA   Dyslipidemia 01/11/2013   Hiatal hernia    Hypertension    Mini stroke ([place])    2   S/P CABG x 3    STEMI (ST elevation myocardial infarction) ([place])    Stroke [facility])     Past Surgical History:  Procedure Laterality Date   CARDIAC CATHETERIZATION     CORONARY ARTERY BYPASS GRAFT N/A 09/01/2012   Procedure: CORONARY ARTERY BYPASS GRAFTING (CABG);  Surgeon: Gaye Pollack, MD;  Location: [place];  Service: Open Heart Surgery;  Laterality: N/A;  Coronary Artery Bypass Grafting Times Three Using Left Internal Mammary Artery and Left Saphenous leg Vein Harvested Endoscopically   CORONARY STENT INTERVENTION N/A 03/23/2019   Procedure: CORONARY STENT INTERVENTION;  Surgeon: Lorretta Harp, MD;  Location: [place] CV LAB;  Service: Cardiovascular;  Laterality: N/A;   CORONARY/GRAFT ACUTE MI REVASCULARIZATION N/A 03/23/2019   Procedure: Coronary/Graft Acute MI Revascularization;  Surgeon: Lorretta Harp, MD;  Location: [place]  CV LAB;  Service: Cardiovascular;  Laterality: N/A;   CORONARY/GRAFT ACUTE MI REVASCULARIZATION N/A 05/03/2020   Procedure: Coronary/Graft Acute MI Revascularization;  Surgeon: Belva Crome, MD;  Location: [place] CV LAB;  Service: Cardiovascular;  Laterality: N/A;   IABP INSERTION N/A 06/04/2020   Procedure: IABP Insertion;  Surgeon: Martinique, Peter M, MD;  Location: [place] CV LAB;  Service: Cardiovascular;  Laterality: N/A;   ICD IMPLANT N/A  06/19/2020   Procedure: ICD IMPLANT;  Surgeon: Vickie Epley, MD;  Location: [place] CV LAB;  Service: Cardiovascular;  Laterality: N/A;   INTRAOPERATIVE TRANSESOPHAGEAL ECHOCARDIOGRAM N/A 09/01/2012   Procedure: INTRAOPERATIVE TRANSESOPHAGEAL ECHOCARDIOGRAM;  Surgeon: Gaye Pollack, MD;  Location: [place] OR;  Service: Open Heart Surgery;  Laterality: N/A;   LEFT HEART CATH AND CORONARY ANGIOGRAPHY N/A 03/23/2019   Procedure: LEFT HEART CATH AND CORONARY ANGIOGRAPHY;  Surgeon: Lorretta Harp, MD;  Location: [place] CV LAB;  Service: Cardiovascular;  Laterality: N/A;   LEFT HEART CATH AND CORONARY ANGIOGRAPHY N/A 05/03/2020   Procedure: LEFT HEART CATH AND CORONARY ANGIOGRAPHY;  Surgeon: Belva Crome, MD;  Location: [place] CV LAB;  Service: Cardiovascular;  Laterality: N/A;   LEFT HEART CATHETERIZATION WITH CORONARY ANGIOGRAM N/A 08/31/2012   Procedure: LEFT HEART CATHETERIZATION WITH CORONARY ANGIOGRAM;  Surgeon: Peter M Martinique, MD;  Location: [facility] CATH LAB;  Service: Cardiovascular;  Laterality: N/A;   PERCUTANEOUS CORONARY STENT INTERVENTION (PCI-S) N/A 08/31/2012   Procedure: PERCUTANEOUS CORONARY STENT INTERVENTION (PCI-S);  Surgeon: Peter M Martinique, MD;  Location[facility] CATH LAB;  Service: Cardiovascular;  Laterality: N/A;   RIGHT/LEFT HEART CATH AND CORONARY/GRAFT ANGIOGRAPHY N/A 06/04/2020   Procedure: RIGHT/LEFT HEART CATH AND CORONARY/GRAFT ANGIOGRAPHY;  Surgeon: Martinique, Peter M, MD;  Location: [place] CV LAB;  Service: Cardiovascular;  Laterality: N/A;   TEMPORARY PACEMAKER N/A 06/04/2020   Procedure: TEMPORARY PACEMAKER;  Surgeon: Martinique, Peter M, MD;  Location: [place] CV LAB;  Service: Cardiovascular;  Laterality: N/A;     Medications Prior to Admission: Prior to Admission medications   Medication Sig Start Date End Date Taking? Authorizing Provider  acetaminophen (TYLENOL) 325 MG tablet Take 2 tablets (650 mg total) by mouth every 4 (four) hours as needed for mild  pain or headache. 06/29/20   Angiulli, Lavon Paganini, PA-C  amiodarone (PACERONE) 200 MG tablet TAKE 1 TABLET BY MOUTH 2 TIMES DAILY. 03/05/21   Croitoru, Mihai, MD  apixaban (ELIQUIS) 5 MG TABS tablet Take 1 tablet (5 mg total) by mouth 2 (two) times daily. 09/06/20   Croitoru, Mihai, MD  atorvastatin (LIPITOR) 80 MG tablet TAKE 1 TABLET BY MOUTH DAILY AT 6 PM. 06/29/20   Angiulli, Lavon Paganini, PA-C  clopidogrel (PLAVIX) 75 MG tablet Take 1 tablet (75 mg total) by mouth daily. 06/29/20   Angiulli, Lavon Paganini, PA-C  escitalopram (LEXAPRO) 10 MG tablet Take 1 tablet (10 mg total) by mouth daily. 06/29/20   Angiulli, Lavon Paganini, PA-C  ezetimibe (ZETIA) 10 MG tablet Take 1 tablet (10 mg total) by mouth daily. 06/29/20 09/27/20  Angiulli, Lavon Paganini, PA-C  hydrALAZINE (APRESOLINE) 10 MG tablet Take 1 tablet (10 mg total) by mouth every 8 (eight) hours. 09/19/20   Vickie Epley, MD  melatonin 3 MG TABS tablet Take 1 tablet (3 mg total) by mouth at bedtime. 08/01/20   Croitoru, Mihai, MD  metoprolol succinate (TOPROL-XL) 25 MG 24 hr tablet Take 1 tablet (25 mg total) by mouth daily. 08/22/20   Croitoru, [place],  MD  Multiple Vitamins-Minerals (ONE-A-DAY MENS 50+ ADVANTAGE) TABS Take 1 tablet by mouth daily with breakfast.    [provider]  nitroGLYCERIN (NITROSTAT) 0.4 MG SL tablet Place 1 tablet (0.4 mg total) under the tongue every 5 (five) minutes x 3 doses as needed for chest pain. 06/29/20   Angiulli, Lavon Paganini, PA-C  sacubitril-valsartan (ENTRESTO) 24-26 MG Take 1 tablet by mouth 2 (two) times daily. 12/21/20   Croitoru, Dani Gobble, MD     Allergies:   No Known Allergies  Social History:   Social History   Socioeconomic History   Marital status: Married    Spouse name: Not on file   Number of children: Not on file   Years of education: Not on file   Highest education level: Not on file  Occupational History   Not on file  Tobacco Use   Smoking status: Former    Types: Pipe   Smokeless tobacco: Former     Quit date: 07/15/1968  Vaping Use   Vaping Use: Never used  Substance and Sexual Activity   Alcohol use: No   Drug use: No   Sexual activity: Not Currently  Other Topics Concern   Not on file  Social History Narrative   Not on file   Social Determinants of Health   Financial Resource Strain: Not on file  Food Insecurity: Not on file  Transportation Needs: Not on file  Physical Activity: Not on file  Stress: Not on file  Social Connections: Not on file  Intimate Partner Violence: Not on file    Family History: Reviewed, noncontributory The patient's family history includes Cancer in his father; Heart attack in his brother, father, and sister; Hyperlipidemia in his brother and father; Hypertension in his brother, father, and sister.    ROS:  Please see the history of present illness.  Review of Systems  Constitutional:  Positive for malaise/fatigue. Negative for weight loss.  HENT:  Negative for congestion and nosebleeds.   Respiratory:  Positive for shortness of breath (If he overexerts). Negative for cough.   Cardiovascular:  Positive for palpitations.  Gastrointestinal:  Positive for nausea (Some this morning). Negative for abdominal pain, blood in stool and melena.  Genitourinary:  Negative for hematuria.  Musculoskeletal:  Negative for falls and joint pain.  Neurological:  Negative for dizziness, focal weakness and weakness.  Psychiatric/Behavioral:  Positive for depression and memory loss. The patient is nervous/anxious and has insomnia (Takes melatonin).        Anxiety and depression seem to be controlled    All other ROS reviewed and negative.     Physical Exam/Data:   Vitals:   03/31/21 0848 03/31/21 0853 03/31/21 0858 03/31/21 0903  BP: (!) 135/94     Pulse: 67 (!) 0 (!) 0 (!) 0  Resp: 18 (!) 0 (!) 0 (!) 0  Temp:      TempSrc:      SpO2: 99% (!) 0% (!) 0% (!) 0%  Weight:      Height:       No intake or output data in the 24 hours ending 03/31/21  0937 Last 3 Weights 03/31/2021 12/21/2020 09/19/2020  Weight (lbs) 200 lb 202 lb 200 lb 9.6 oz  Weight (kg) 90.719 kg 91.627 kg 90.992 kg     Body mass index is 31.32 kg/m.  General:  Well nourished, well developed, in no acute distress stating that he is chest pain was almost all resolved upon my initial evaluation HEENT: normal Neck:  no carotid bruit or JVD.  Unable to assess HJR Vascular: No carotid bruits; Distal pulses 2+ bilaterally   Cardiac:  normal S1, S2; RRR with ectopy; no murmur or rubs.  Cannot exclude S4 gallop.  (Difficult with ectopy) Lungs:  clear to auscultation bilaterally, no wheezing, rhonchi or rales; nonlabored Abd: soft, nontender, no hepatomegaly  Ext: no clubbing/cyanosis or edema Musculoskeletal:  No deformities, BUE and BLE strength normal and equal Skin: warm and dry  Neuro:  CNs 2-12 intact, no focal abnormalities noted Psych:  Normal affect    EKG: Multiple EKGs ECG that was done via paramedics were personally reviewed and demonstrates what appears to be sinus rhythm with PACs and either PVCs or paced beats.  Subtle 1 to 2 mm ST elevations in leads II, III, aVF with more prominent ST depressions in V1 through V3.  Posterior lead EKG is also performed show subtle inferior elevations in V4 suggesting posterior STEMI  EKG in ER: Rate 68 bpm, sinus rhythm with appears to be the ventricular trigeminy or paced beats.  Prominent ST depressions in V2 through V4, 1-2 mm ST elevation in III and aVF -> consistent with inferior/posterior STEMI  Relevant CV Studies: 2D echo 08/25/2020: EF 25 to 30%.  Serial decreased function.  Severe LVH of the basal septum.  GR 1 DD.  Inferior wall akinesis and apex.  Hypokinesis of the inferolateral wall.  Mild aortic sclerosis with no stenosis.  Aortic root 39 mm, ascending aorta 42 mm. RL CP 06/04/2020 (presented with V. tach): Severe three-vessel CAD with occluded mid LAD, ostial/proximal LCx and ostial/proximal RCA.  Patent LIMA-LAD,  patent SVG-OM1, patent SVG-RCA with improved flow from recent PTCA in October 2021.  Successful PPM placed.  RAP 7 million mercury, RVP 41/6 mm, PAP mean 28 mmHg.  PCWP 14 mmHg LCPG-PTCA 05/03/2020 (inferior STEMI with ventricular tachycardia): Thrombotic occlusion of SVG-RCA (likely stent thrombosis after missing Brilinta.  PTCA only reducing stenosis to 50%.  CTO of proximal RCA, proximal to mid LCx, mid LAD.  Patent LIMA-LAD and patent SVG-OM1. L CPG-PCI 03/23/2019 (inferior STEMI, CHB/asystole)  Laboratory Data:  High Sensitivity Troponin:  No results for input(s): TROPONINIHS in the last 720 hours.    Chemistry Recent Labs  Lab 03/31/21 0756  NA 141  K 4.0  CL 107  GLUCOSE 112*  BUN 26*  CREATININE 1.60*    No results for input(s): PROT, ALBUMIN, AST, ALT, ALKPHOS, BILITOT in the last 168 hours. Lipids  Recent Labs  Lab 03/31/21 0752  CHOL 168  TRIG 385*  HDL 30*  LDLCALC 61  CHOLHDL 5.6   Hematology Recent Labs  Lab 03/31/21 0752 03/31/21 0756  WBC 15.3*  --   RBC 5.32  --   HGB 16.2 17.0  HCT 50.3 50.0  MCV 94.5  --   MCH 30.5  --   MCHC 32.2  --   RDW 13.6  --   PLT 323  --    Thyroid No results for input(s): TSH, FREET4 in the last 168 hours. BNPNo results for input(s): BNP, PROBNP in the last 168 hours.  DDimer No results for input(s): DDIMER in the last 168 hours.   Radiology/Studies:  No results found.   Assessment and Plan:   Principal Problem:   Acute ST elevation myocardial infarction (STEMI) involving other coronary artery of inferior wall (HCC) Active Problems:   CAD S/P percutaneous coronary angioplasty   Ischemic cardiomyopathy   CAD, multiple vessel -> severe native vessel CAD-CABG 2014  Hx of CABG   History of pulmonary embolus (PE) - on Eliquis   PAF (paroxysmal atrial fibrillation) (HCC)   Hyperlipidemia with target LDL less than 70   Essential hypertension   Cardiac defibrillator in situ   Patient with known severe CAD and  ischemic cardiomyopathy with EF of 25 to 30% and several interventions of the SVG-RCA presents now with chest pain beginning at 4 AM that radiated to his back.  Upon arrival to the ER he was 2 out of 10 chest pain that resolved shortly thereafter.  EKG shows subtle (1 to 2 mm) inferior elevations with frequent ectopy, and prominent ST depressions in septal leads V1 through V3 concerning for possible posterior STEMI. Despite being pain-free, with the EKG and hand and his previous history of V. tach and asystole during prior infarct, chose to take him to the Cath Lab.  In the cardiac catheterization lab, he was found to have a an extensively thrombotic occlusion of the SVG-OM that was not felt to be salvageable without potentially risking distal embolization into the native vessel.  Since he was chest pain-free, we chose to transfer to the ICU placed on Aggrastat and reevaluate on Monday morning.  Post cath plan: Continue Aggrastat for 12 hours and restart IV heparin after 6 hours.  We will continue his home medications with exception of Eliquis. He is on Plavix 75 mg daily which we continued Continue Toprol 25 mg daily as well as Entresto 24 to 26 mg daily.   Continue amiodarone for rate/rhythm control both A. fib and history of VT He is on very low-dose hydralazine.  Has not been able to successfully titrate  Has melatonin for sleep assistance, along with escitalopram for anxiety.   Risk Assessment/Risk Scores:    TIMI Risk Score for ST  Elevation MI:   The patient's TIMI risk score is  , which indicates a  % risk of all cause mortality at 30 days.   New York Heart Association (NYHA) Functional Class NYHA Class II-chronic/known with no acute exacerbation  CHA2DS2-VASc Score = 4   This indicates a 4.8% annual risk of stroke. The patient's score is based upon: CHF History: 1 HTN History: 1 Diabetes History: 0 Stroke History: 0 Vascular Disease History: 1 Age Score: 1 Gender Score: 0      Severity of Illness: The appropriate patient status for this patient is OBSERVATION. Observation status is judged to be reasonable and necessary in order to provide the required intensity of service to ensure the patient's safety. The patient's presenting symptoms, physical exam findings, and initial radiographic and laboratory data in the context of their medical condition is felt to place them at decreased risk for further clinical deterioration. Furthermore, it is anticipated that the patient will be medically stable for discharge from the hospital within 2 midnights of admission. The following factors support the patient status of observation.   " The patient's presenting symptoms include acute onset anginal chest and back pain associated with shortness of breath.  Known CAD with prior MI " The physical exam findings include notable distress on EMS arrival, improving.. " The initial radiographic and laboratory data are EKG findings consistent with inferior/posterior STEMI.   For questions or updates, please contact [place] Please consult [email or web address] for contact info under     Signed, Glenetta Hew, MD  03/31/2021 9:37 AM

## 2021-04-01 ENCOUNTER — Inpatient Hospital Stay (HOSPITAL_COMMUNITY): Payer: Medicare HMO

## 2021-04-01 DIAGNOSIS — I2119 ST elevation (STEMI) myocardial infarction involving other coronary artery of inferior wall: Secondary | ICD-10-CM

## 2021-04-01 LAB — ECHOCARDIOGRAM COMPLETE
AR max vel: 2.01 cm2
AV Area VTI: 2.36 cm2
AV Area mean vel: 2.21 cm2
AV Mean grad: 3 mmHg
AV Peak grad: 6 mmHg
Ao pk vel: 1.22 m/s
Height: 67 in
S' Lateral: 3.2 cm
Weight: 3188.73 oz

## 2021-04-01 LAB — BASIC METABOLIC PANEL
Anion gap: 11 (ref 5–15)
BUN: 20 mg/dL (ref 8–23)
CO2: 21 mmol/L — ABNORMAL LOW (ref 22–32)
Calcium: 8.7 mg/dL — ABNORMAL LOW (ref 8.9–10.3)
Chloride: 105 mmol/L (ref 98–111)
Creatinine, Ser: 1.52 mg/dL — ABNORMAL HIGH (ref 0.61–1.24)
GFR, Estimated: 50 mL/min — ABNORMAL LOW (ref >60)
Glucose, Bld: 145 mg/dL — ABNORMAL HIGH (ref 70–99)
Potassium: 3.8 mmol/L (ref 3.5–5.1)
Sodium: 137 mmol/L (ref 135–145)

## 2021-04-01 LAB — CBC
HCT: 47.3 % (ref 39.0–52.0)
Hemoglobin: 16.2 g/dL (ref 13.0–17.0)
MCH: 31.3 pg (ref 26.0–34.0)
MCHC: 34.2 g/dL (ref 30.0–36.0)
MCV: 91.5 fL (ref 80.0–100.0)
Platelets: 290 10*3/uL (ref 150–400)
RBC: 5.17 MIL/uL (ref 4.22–5.81)
RDW: 13.8 % (ref 11.5–15.5)
WBC: 17.8 10*3/uL — ABNORMAL HIGH (ref 4.0–10.5)
nRBC: 0 % (ref 0.0–0.2)

## 2021-04-01 LAB — HEPARIN LEVEL (UNFRACTIONATED)
Heparin Unfractionated: 0.23 IU/mL — ABNORMAL LOW (ref 0.30–0.70)
Heparin Unfractionated: 0.37 IU/mL (ref 0.30–0.70)

## 2021-04-01 LAB — APTT
aPTT: 51 seconds — ABNORMAL HIGH (ref 24–36)
aPTT: 52 seconds — ABNORMAL HIGH (ref 24–36)

## 2021-04-01 MED ORDER — SODIUM CHLORIDE 0.9 % WEIGHT BASED INFUSION: 1.0000 mL/kg/h | INTRAVENOUS | Status: DC

## 2021-04-01 MED ORDER — SODIUM CHLORIDE 0.9 % WEIGHT BASED INFUSION
3.0000 mL/kg/h | INTRAVENOUS | Status: AC
  Administered 2021-04-02: 3 mL/kg/h via INTRAVENOUS

## 2021-04-01 NOTE — Progress Notes (Signed)
  Echocardiogram 2D Echocardiogram has been performed.  [name] 04/01/2021, 2:33 PM

## 2021-04-01 NOTE — Progress Notes (Signed)
[facility] for heparin + tirofiban Indication: chest pain/ACS and atrial fibrillation  No Known Allergies  Patient Measurements: Height: '5\' 7"'$  (170.2 cm) Weight: 90.4 kg (199 lb 4.7 oz) IBW/kg (Calculated) : 66.1 Heparin Dosing Weight: 90kg  Vital Signs: Temp: 98.5 F (36.9 C) (09/18 0732) Temp Source: Oral (09/18 0732) BP: 98/76 (09/18 0700) Pulse Rate: 60 (09/18 0700)  Labs: Recent Labs    03/31/21 0752 03/31/21 0756 03/31/21 1114 03/31/21 1439 03/31/21 1629 04/01/21 0045 04/01/21 0809  HGB 16.2 17.0  --   --   --  16.2  --   HCT 50.3 50.0  --   --   --  47.3  --   PLT 323  --   --   --   --  290  --   APTT 29  --   --   --   --  51*  --   LABPROT 13.6  --   --   --   --   --   --   INR 1.0  --   --   --   --   --   --   HEPARINUNFRC  --   --   --   --   --  0.23* 0.37  CREATININE 1.68* 1.60*  --   --   --  1.52*  --   TROPONINIHS 261*  --  3,554* 12,036* >24,000*  --   --      Estimated Creatinine Clearance: 51.3 mL/min (A) (by C-G formula based on SCr of 1.52 mg/dL (H)).   Medical History: Past Medical History:  Diagnosis Date   Borderline diabetic    Coronary artery disease    a. s/p CABG in 2014 with LIMA-LAD, SVG-OM2 and SVG-RC b. 03/2019: STEMI and s/p angioplasty to proximal SVG-RCA and DES to SVG-RCA anastomosis c. 04/2020: recurrent STEMI with angioplasty of SVG-RCA   Dyslipidemia 01/11/2013   Hiatal hernia    Hypertension    Mini stroke ([place])    2   S/P CABG x 3    STEMI (ST elevation myocardial infarction) ([place])    Stroke [facility])     Assessment: 89 yoM admitted as code STEMI found to have thrombotic occlusion within SVG not amenable to PCI. Pt on apixaban PTA for hx AF, last dose 9/16 pm. Tirofiban started in cath lab and continued x12h. Heparin resumed after sheath removal.  Tirofiban off, heparin level therapeutic, aPTT still pending but they were correlating earlier this morning, CBC stable. Planning for  relook cath tomorrow.  Goal of Therapy:  Heparin level 0.3-0.7 units/ml aPTT 66-102 seconds Monitor platelets by anticoagulation protocol: Yes   Plan:  -Heparin 1400units/h -Daily heparin level and CBC -No further aPTT monitoring  Arrie Senate, PharmD, BCPS, [facility] Clinical Pharmacist [phone] Please check AMION for all [place] numbers 04/01/2021

## 2021-04-01 NOTE — Progress Notes (Signed)
ANTICOAGULATION CONSULT NOTE - Follow Up Consult  Pharmacy Consult for heparin Indication:  STEMI and Afib awaiting staged angiography  Labs: Recent Labs    03/31/21 0752 03/31/21 0756 03/31/21 1114 03/31/21 1439 03/31/21 1629 04/01/21 0045  HGB 16.2 17.0  --   --   --  16.2  HCT 50.3 50.0  --   --   --  47.3  PLT 323  --   --   --   --  290  APTT 29  --   --   --   --  51*  LABPROT 13.6  --   --   --   --   --   INR 1.0  --   --   --   --   --   HEPARINUNFRC  --   --   --   --   --  0.23*  CREATININE 1.68* 1.60*  --   --   --  1.52*  TROPONINIHS 261*  --  3,554* 12,036* >24,000*  --     Assessment: 65yo male subtherapeutic on heparin with initial dosing post-cath; no infusion issues or signs of bleeding per RN.  Goal of Therapy:  Heparin level 0.3-0.7 units/ml aPTT 66-10 seconds   Plan:  Will increase heparin infusion by 2 units/kg/hr to 1400 units/hr and check level in 6 hours.    Wynona Neat, PharmD, BCPS  04/01/2021,1:47 AM

## 2021-04-01 NOTE — Progress Notes (Addendum)
Progress Note  Patient Name: [name] Date of Encounter: 04/01/2021  Primary Cardiologist: Sanda Klein, MD  Subjective   Sitting up in bedside chair, is eating breakfast.  States that he feels better, no active chest pain or back pain.  Inpatient Medications    Scheduled Meds:  amiodarone  200 mg Oral BID   aspirin  324 mg Oral Once   aspirin EC  81 mg Oral Daily   atorvastatin  80 mg Oral Daily   Chlorhexidine Gluconate Cloth  6 each Topical Daily   clopidogrel  75 mg Oral Daily   escitalopram  10 mg Oral Daily   ezetimibe  10 mg Oral Daily   hydrALAZINE  10 mg Oral Q8H   melatonin  3 mg Oral QHS   metoprolol succinate  25 mg Oral Daily   sacubitril-valsartan  1 tablet Oral BID   sodium chloride flush  3 mL Intravenous Q12H   Continuous Infusions:  sodium chloride Stopped (03/31/21 1930)   sodium chloride     heparin 1,400 Units/hr (04/01/21 0600)   PRN Meds: sodium chloride, acetaminophen, morphine injection, nitroGLYCERIN, ondansetron (ZOFRAN) IV, sodium chloride flush   Vital Signs    Vitals:   04/01/21 0600 04/01/21 0606 04/01/21 0700 04/01/21 0732  BP: 112/67 112/62 98/76   Pulse: (!) 59  60   Resp: 20  20   Temp:    98.5 F (36.9 C)  TempSrc:    Oral  SpO2: 93%  94%   Weight: 90.4 kg     Height:        Intake/Output Summary (Last 24 hours) at 04/01/2021 0804 Last data filed at 04/01/2021 0600 Gross per 24 hour  Intake 1766.24 ml  Output 2375 ml  Net -608.76 ml   Filed Weights   03/31/21 0746 04/01/21 0600  Weight: 90.7 kg 90.4 kg    Telemetry    Sinus rhythm.  Personally reviewed.  ECG    An ECG dated 04/01/2021 was personally reviewed today and demonstrated:  Sinus rhythm with residual ST segment abnormalities consistent with recent inferoposterior infarct.  Physical Exam   GEN: No acute distress.   Neck: No JVD. Cardiac: RRR, no murmur, rub, or gallop.  Respiratory: Nonlabored. Clear to auscultation bilaterally. GI: Soft,  nontender, bowel sounds present. MS: No edema; right femoral arteriotomy site dressed and stable. Neuro:  Nonfocal. Psych: Alert and oriented x 3. Normal affect.  Labs    Chemistry Recent Labs  Lab 03/31/21 0752 03/31/21 0756 04/01/21 0045  NA 140 141 137  K 4.3 4.0 3.8  CL 105 107 105  CO2 23  --  21*  GLUCOSE 108* 112* 145*  BUN 25* 26* 20  CREATININE 1.68* 1.60* 1.52*  CALCIUM 9.3  --  8.7*  PROT 7.5  --   --   ALBUMIN 4.0  --   --   AST 36  --   --   ALT 23  --   --   ALKPHOS 72  --   --   BILITOT 1.1  --   --   GFRNONAA 45*  --  50*  ANIONGAP 12  --  11     Hematology Recent Labs  Lab 03/31/21 0752 03/31/21 0756 04/01/21 0045  WBC 15.3*  --  17.8*  RBC 5.32  --  5.17  HGB 16.2 17.0 16.2  HCT 50.3 50.0 47.3  MCV 94.5  --  91.5  MCH 30.5  --  31.3  MCHC 32.2  --  34.2  RDW 13.6  --  13.8  PLT 323  --  290    Cardiac Enzymes Recent Labs  Lab 03/31/21 0752 03/31/21 1114 03/31/21 1439 03/31/21 1629  TROPONINIHS 261* 3,554* 12,036* >24,000*    Radiology    CARDIAC CATHETERIZATION  Result Date: 03/31/2021   Mid LAD lesion is 100% stenosed.   Ost Cx to Prox Cx lesion is 100% stenosed.   Prox RCA to Dist RCA lesion is 100% stenosed.   Dist RCA lesion is 20% stenosed.   Origin to Prox Graft lesion is 30% stenosed.   RPDA lesion is 30% stenosed.   Prox Graft to Insertion lesion is 100% stenosed.   LIMA graft was not visualized due to inability to cannulate.   LV end diastolic pressure is severely elevated.   There is no aortic valve stenosis. SUMMARY Severe multivessel native CAD: Known CTO of mid LAD, proximal RCA and proximal LCx Culprit lesion is extensively thrombotic occlusion of the SVG-OM1 with dye staining after 1 injection throughout the entire case. Assumed patent LIMA-LAD (unable to engage) Patent SVG-RCA with patent stent and previous anastomosis PTCA Known ejection fraction of 25 to 30% (no LV gram performed to conserve contrast), -> LVEDP of 25  mmHg suggests ACUTE ON CHRONIC DIASTOLIC HEART FAILURE in setting of known CHRONIC COMBINED SYSTOLIC AND DIASTOLIC HEART FAILURE PLAN: Based on the severity of the SVG occlusion with extensive thrombus/Promus noted and no flow reaching the native vessel, I assessed the patient symptoms.  He was not having chest pain.  I determined that attempted intervention on this vessel at this time would likely lead to distal embolization and occlusion of the native LCx. We will run Aggrastat x12 hours and continue home dose of Plavix Restart IV heparin 6 hours after sheath removal (hold Eliquis) Consider relook catheterization on Monday, 04/02/2021 to determine if there is any flow down the SVG or potential PCI of the native LCx which does not appear to be viable.  Would also evaluate LIMA-LAD via the left radial access. With elevated EDP and known reduced EF, check 2D echocardiogram and give at least 1 dose IV Lasix today.  Reassess in the morning. Gentle post-cath hydration follow renal function. Continue home medications including Entresto, Toprol, amiodarone   DG Chest Port 1 View  Result Date: 03/31/2021 CLINICAL DATA:  Acute ST-elevation EXAM: PORTABLE CHEST 1 VIEW COMPARISON:  June 24, 2020 FINDINGS: Stable pacemaker. Stable cardiomegaly. The hila, mediastinum, lungs, and pleura are normal. IMPRESSION: No active disease. Electronically Signed   By: Dorise Bullion III M.D.   On: 03/31/2021 10:15    Assessment & Plan    1.  Inferoposterior STEMI, high-sensitivity troponin I level greater than 24,000.  Patient currently pain-free.  Cardiac catheterization yesterday demonstrated severe native vessel CAD, probable patent LIMA to LAD (unable to engage), patent SVG to RCA with patent stent and previous anastomotic PTCA, culprit lesion being thrombotic occlusion of the SVG to OM1 which was felt to be best managed medically initially given patient stability and high risk of distal embolization.  Plan is for relook  angiography tomorrow, he was treated with Aggrastat and is back on heparin now.  ECG shows improvement in ST segment changes although not resolution.  Echocardiogram pending for follow-up LVEF.  Currently on aspirin, Plavix, Lipitor, Zetia, and IV heparin.  2.  Ischemic cardiomyopathy.  LVEF 25 to 30% by echocardiogram in February with follow-up assessment pending.  Currently on Toprol-XL, hydralazine, and Entresto.  3.  Ventricular  tachycardia, he continues on amiodarone.  No significant events by telemetry review.  Recent TSH and LFTs normal.  4.  Mixed hyperlipidemia, on Lipitor and Zetia.  LDL 61.  5.  CKD stage IIIb, creatinine 1.52 down from 1.60.  Gentle hydration prior to follow-up angiography.  Continue with current medications, follow-up echocardiogram which is pending.  Patient will be scheduled for a relook cardiac catheterization tomorrow to assess for residual revascularization options following treatment with Aggrastat and now heparin.  Initially thrombotic occlusion of the SVG to OM1 looked to carry a fairly high distal embolization risk.  Of note, Dr. Allison Quarry procedure note indicates evaluation of LIMA to LAD via left radial access.  Check CBC, BMET, and ECG in AM.  Signed, Rozann Lesches, MD  04/01/2021, 8:04 AM

## 2021-04-01 NOTE — Progress Notes (Signed)
EKG CRITICAL VALUE     12 lead EKG performed.  Critical value noted.  Philipp Ovens, RN notified.   [place], CCT 04/01/2021 9:32 AM

## 2021-04-01 NOTE — Plan of Care (Signed)

## 2021-04-02 ENCOUNTER — Inpatient Hospital Stay (HOSPITAL_COMMUNITY): Payer: Medicare HMO

## 2021-04-02 ENCOUNTER — Encounter (HOSPITAL_COMMUNITY): Payer: Self-pay | Admitting: Cardiology

## 2021-04-02 ENCOUNTER — Encounter (HOSPITAL_COMMUNITY)
Admission: EM | Disposition: A | Discharge status: Home / Self Care | Payer: Self-pay | Source: Home / Self Care | Attending: Cardiology

## 2021-04-02 ENCOUNTER — Encounter: Payer: Medicare HMO | Admitting: Cardiovascular Disease

## 2021-04-02 ENCOUNTER — Other Ambulatory Visit (HOSPITAL_COMMUNITY): Payer: Self-pay

## 2021-04-02 DIAGNOSIS — I5022 Chronic systolic (congestive) heart failure: Secondary | ICD-10-CM

## 2021-04-02 DIAGNOSIS — E785 Hyperlipidemia, unspecified: Secondary | ICD-10-CM | POA: Diagnosis not present

## 2021-04-02 DIAGNOSIS — Z951 Presence of aortocoronary bypass graft: Secondary | ICD-10-CM | POA: Diagnosis not present

## 2021-04-02 DIAGNOSIS — I2510 Atherosclerotic heart disease of native coronary artery without angina pectoris: Secondary | ICD-10-CM | POA: Diagnosis not present

## 2021-04-02 DIAGNOSIS — I10 Essential (primary) hypertension: Secondary | ICD-10-CM | POA: Diagnosis not present

## 2021-04-02 LAB — COMPREHENSIVE METABOLIC PANEL
ALT: 34 U/L (ref 0–44)
AST: 131 U/L — ABNORMAL HIGH (ref 15–41)
Albumin: 3.1 g/dL — ABNORMAL LOW (ref 3.5–5.0)
Alkaline Phosphatase: 56 U/L (ref 38–126)
Anion gap: 9 (ref 5–15)
BUN: 21 mg/dL (ref 8–23)
CO2: 23 mmol/L (ref 22–32)
Calcium: 8.4 mg/dL — ABNORMAL LOW (ref 8.9–10.3)
Chloride: 102 mmol/L (ref 98–111)
Creatinine, Ser: 1.5 mg/dL — ABNORMAL HIGH (ref 0.61–1.24)
GFR, Estimated: 51 mL/min — ABNORMAL LOW (ref >60)
Glucose, Bld: 119 mg/dL — ABNORMAL HIGH (ref 70–99)
Potassium: 4 mmol/L (ref 3.5–5.1)
Sodium: 134 mmol/L — ABNORMAL LOW (ref 135–145)
Total Bilirubin: 1.3 mg/dL — ABNORMAL HIGH (ref 0.3–1.2)
Total Protein: 6.4 g/dL — ABNORMAL LOW (ref 6.5–8.1)

## 2021-04-02 LAB — CBC WITH DIFFERENTIAL/PLATELET
Abs Immature Granulocytes: 0.06 10*3/uL (ref 0.00–0.07)
Basophils Absolute: 0 10*3/uL (ref 0.0–0.1)
Basophils Relative: 0 %
Eosinophils Absolute: 0 10*3/uL (ref 0.0–0.5)
Eosinophils Relative: 0 %
HCT: 45.3 % (ref 39.0–52.0)
Hemoglobin: 15.2 g/dL (ref 13.0–17.0)
Immature Granulocytes: 0 %
Lymphocytes Relative: 14 %
Lymphs Abs: 2.1 10*3/uL (ref 0.7–4.0)
MCH: 31.2 pg (ref 26.0–34.0)
MCHC: 33.6 g/dL (ref 30.0–36.0)
MCV: 93 fL (ref 80.0–100.0)
Monocytes Absolute: 1.9 10*3/uL — ABNORMAL HIGH (ref 0.1–1.0)
Monocytes Relative: 13 %
Neutro Abs: 11.1 10*3/uL — ABNORMAL HIGH (ref 1.7–7.7)
Neutrophils Relative %: 73 %
Platelets: 226 10*3/uL (ref 150–400)
RBC: 4.87 MIL/uL (ref 4.22–5.81)
RDW: 14.2 % (ref 11.5–15.5)
WBC: 15.2 10*3/uL — ABNORMAL HIGH (ref 4.0–10.5)
nRBC: 0 % (ref 0.0–0.2)

## 2021-04-02 LAB — BASIC METABOLIC PANEL
Anion gap: 11 (ref 5–15)
BUN: 18 mg/dL (ref 8–23)
CO2: 21 mmol/L — ABNORMAL LOW (ref 22–32)
Calcium: 8.3 mg/dL — ABNORMAL LOW (ref 8.9–10.3)
Chloride: 101 mmol/L (ref 98–111)
Creatinine, Ser: 1.36 mg/dL — ABNORMAL HIGH (ref 0.61–1.24)
GFR, Estimated: 57 mL/min — ABNORMAL LOW (ref >60)
Glucose, Bld: 124 mg/dL — ABNORMAL HIGH (ref 70–99)
Potassium: 3.7 mmol/L (ref 3.5–5.1)
Sodium: 133 mmol/L — ABNORMAL LOW (ref 135–145)

## 2021-04-02 LAB — CBC
HCT: 47.2 % (ref 39.0–52.0)
Hemoglobin: 15.5 g/dL (ref 13.0–17.0)
MCH: 30.5 pg (ref 26.0–34.0)
MCHC: 32.8 g/dL (ref 30.0–36.0)
MCV: 92.7 fL (ref 80.0–100.0)
Platelets: 247 10*3/uL (ref 150–400)
RBC: 5.09 MIL/uL (ref 4.22–5.81)
RDW: 14 % (ref 11.5–15.5)
WBC: 15.8 10*3/uL — ABNORMAL HIGH (ref 4.0–10.5)
nRBC: 0 % (ref 0.0–0.2)

## 2021-04-02 LAB — HEPARIN LEVEL (UNFRACTIONATED): Heparin Unfractionated: 0.31 IU/mL (ref 0.30–0.70)

## 2021-04-02 LAB — LACTIC ACID, PLASMA: Lactic Acid, Venous: 1.6 mmol/L (ref 0.5–1.9)

## 2021-04-02 LAB — PROCALCITONIN: Procalcitonin: <0.1 ng/mL

## 2021-04-02 SURGERY — LEFT HEART CATH AND CORONARY ANGIOGRAPHY
Anesthesia: LOCAL

## 2021-04-02 MED ORDER — DAPAGLIFLOZIN PROPANEDIOL 10 MG PO TABS
10.0000 mg | ORAL_TABLET | Freq: Every day | ORAL | Status: DC
  Administered 2021-04-02 – 2021-04-03 (×2): 10 mg via ORAL
  Filled 2021-04-02 (×2): qty 1

## 2021-04-02 MED ORDER — APIXABAN 5 MG PO TABS
5.0000 mg | ORAL_TABLET | Freq: Two times a day (BID) | ORAL | Status: DC
  Administered 2021-04-02 – 2021-04-03 (×3): 5 mg via ORAL
  Filled 2021-04-02 (×3): qty 1

## 2021-04-02 MED ORDER — SPIRONOLACTONE 12.5 MG HALF TABLET
12.5000 mg | ORAL_TABLET | Freq: Every day | ORAL | Status: DC
  Administered 2021-04-02 – 2021-04-03 (×2): 12.5 mg via ORAL
  Filled 2021-04-02 (×2): qty 1

## 2021-04-02 NOTE — Progress Notes (Signed)
EKG CRITICAL VALUE     12 lead EKG performed.  Critical value noted.  Monna Fam, RN notified.   [place], CCT 04/02/2021 9:18 AM

## 2021-04-02 NOTE — TOC Benefit Eligibility Note (Addendum)
Patient Teacher, English as a foreign language completed.    The patient is currently admitted and upon discharge could be taking Farxiga 10 mg.  The current 30 day co-pay is, $191.95. Due to patient being in Coverage Gap (donut hole)  The patient is currently admitted and upon discharge could be taking Jardiance 10 mg.  The current 30 day co-pay is, $182.66. Due to patient being in Coverage Gap (donut hole)  The patient is insured through [place], [place] Patient Advocate Specialist [place] Team Direct Number: [phone]  Fax: [phone]

## 2021-04-02 NOTE — Progress Notes (Addendum)
[name] for heparin + tirofiban Indication: chest pain/ACS and atrial fibrillation  No Known Allergies  Patient Measurements: Height: '5\' 7"'$  (170.2 cm) Weight: 91 kg (200 lb 9.9 oz) IBW/kg (Calculated) : 66.1 Heparin Dosing Weight: 90kg  Vital Signs: Temp: 99.2 F (37.3 C) (09/19 0300) Temp Source: Oral (09/19 0300) BP: 105/69 (09/19 0600) Pulse Rate: 75 (09/19 0700)  Labs: Recent Labs    03/31/21 0752 03/31/21 0756 03/31/21 1114 03/31/21 1439 03/31/21 1629 04/01/21 0045 04/01/21 0809 04/02/21 0124  HGB 16.2 17.0  --   --   --  16.2  --  15.5  HCT 50.3 50.0  --   --   --  47.3  --  47.2  PLT 323  --   --   --   --  290  --  247  APTT 29  --   --   --   --  51* 52*  --   LABPROT 13.6  --   --   --   --   --   --   --   INR 1.0  --   --   --   --   --   --   --   HEPARINUNFRC  --   --   --   --   --  0.23* 0.37 0.31  CREATININE 1.68* 1.60*  --   --   --  1.52*  --  1.36*  TROPONINIHS 261*  --  3,554* 12,036* >24,000*  --   --   --      Estimated Creatinine Clearance: 57.5 mL/min (A) (by C-G formula based on SCr of 1.36 mg/dL (H)).   Medical History: Past Medical History:  Diagnosis Date   Borderline diabetic    Coronary artery disease    a. s/p CABG in 2014 with LIMA-LAD, SVG-OM2 and SVG-RC b. 03/2019: STEMI and s/p angioplasty to proximal SVG-RCA and DES to SVG-RCA anastomosis c. 04/2020: recurrent STEMI with angioplasty of SVG-RCA   Dyslipidemia 01/11/2013   Hiatal hernia    Hypertension    Mini stroke ([place])    2   S/P CABG x 3    STEMI (ST elevation myocardial infarction) ([place])    Stroke [facility])     Assessment: 56 yoM admitted as code STEMI found to have thrombotic occlusion within SVG not amenable to PCI. Pt on apixaban PTA for hx AF, last dose 9/16 pm. Tirofiban started in cath lab and continued x12h. Heparin resumed after sheath removal.  Heparin level therapeutic today, but cath canceled and patient will be  medically managed.  CBC and PLT stable.   Goal of Therapy:  Heparin level 0.3-0.7 units/ml aPTT 66-102 seconds Monitor platelets by anticoagulation protocol: Yes   Plan:  No adjustments needed. Plan is to discontinue heparin and resume apixaban.  Cyd Silence  Pharm D. Candidate  [facility]  04/02/2021

## 2021-04-02 NOTE — TOC Benefit Eligibility Note (Signed)
Patient Advocate Encounter  Patient is approved through the [place] for Heart Failure Medications through [employer].      Lyndel Safe, [place] Patient Advocate Specialist [place] Antimicrobial Stewardship Team Direct Number: [phone]  Fax: [phone]

## 2021-04-02 NOTE — Progress Notes (Addendum)
   Received page about automatic STEMI read on EKG this morning. Patient presented with inferoposterior STEMI on 03/31/2021. Emergent cath showed SVG occlusion with extensive thrombus/promus noted. Ne was treated with Aggrastat and restarted on IV Heparin after cath with plans for relook cath today.  Reviewed EKG normal sinus rhythm, rate 72 bpm, with Q waves in inferior leads and horizontal ST depression in V1-V3 (more prominent from yesterday but improved from 9/17). Still has slight ST elevation in lead III but improved from yesterday. Patient denies any chest pain. He has had some mild left shoulder pain overnight which he thinks is from the way he is laying in bed. Improved with Tylenol. Per RN, only has mild 2/10 pain at this time. However, no recurrent symptoms like what brought him in. Looks consistent with continued evolving changes after recent MI. Can continue with plan for repeat cath today but I don't think this needs to be done emergently given patient is not having any chest pain.  Darreld Mclean, PA-C 04/02/2021 7:00 AM

## 2021-04-02 NOTE — Progress Notes (Signed)
EKG completed.  Paged NP at 1819 with EKG results.  Received returned call at 1820.  No new orders at this time.  Patient resting comfortably with no pain or discomfort.  Current VS Temp 100.6 oral, BP 105/84, HR 70, RR 16, O2 97% on room air

## 2021-04-02 NOTE — Progress Notes (Addendum)
    Called by RN for fever of 102.4,  BP  105/77 P 75 R 25 to 16   Discussed with Dr. Irish Lack may be due to MI.  Will check Blood cultures, lactic acid, PCXR, EKG, procalcitonin, CBC with diff, CMP.  Will give ABX if labs abnormal, would consider CCM consult.   No pian with deep breath.  General:Pleasant affect, NAD Skin:Warm and dry, brisk capillary refill HEENT:normocephalic, sclera clear, mucus membranes moist Neck:supple, no JVD, no bruits  Heart:S1S2 RRR without murmur, gallup, rub or click Lungs:clear without rales, rhonchi, or wheezes [id number], non tender, + BS, do not palpate liver spleen or masses Ext:no lower ext edema, 2+ pedal pulses, 2+ radial pulses Neuro:alert and oriented X 3, MAE, follows commands, + facial symmetry   EKG SR and computer read as acute MI but pt is without pain and  no acute EKG changes. Continues ST depression in ant lat leads.  PCXR and labs pending.    WBC is actually decreased slightly,  temp improved.  Procalcitonin pending.CMP pending.      Cecilie Kicks, FNP-C At [facility] Pgr:[phone] or after 5pm and on weekends call [phone] 04/02/2021

## 2021-04-02 NOTE — Progress Notes (Addendum)
Progress Note  Patient Name: Gabrielle Patenaude Date of Encounter: 04/02/2021  [facility] Cardiologist: Sanda Klein, MD   Subjective   Doing well this morning.  No chest discomfort.  He was willing to consider SGLT2 inhibitor given his heart failure.  Has some left shoulder pain which is different from prior angina.  This is more related to how he is lying in the bed.  Inpatient Medications    Scheduled Meds:  amiodarone  200 mg Oral BID   aspirin  324 mg Oral Once   aspirin EC  81 mg Oral Daily   atorvastatin  80 mg Oral Daily   Chlorhexidine Gluconate Cloth  6 each Topical Daily   clopidogrel  75 mg Oral Daily   escitalopram  10 mg Oral Daily   ezetimibe  10 mg Oral Daily   hydrALAZINE  10 mg Oral Q8H   melatonin  3 mg Oral QHS   metoprolol succinate  25 mg Oral Daily   sacubitril-valsartan  1 tablet Oral BID   sodium chloride flush  3 mL Intravenous Q12H   Continuous Infusions:  sodium chloride Stopped (03/31/21 1930)   sodium chloride     sodium chloride 1 mL/kg/hr (04/02/21 0603)   heparin 1,400 Units/hr (04/02/21 0732)   PRN Meds: sodium chloride, acetaminophen, morphine injection, nitroGLYCERIN, ondansetron (ZOFRAN) IV, sodium chloride flush   Vital Signs    Vitals:   04/02/21 0600 04/02/21 0700 04/02/21 0800 04/02/21 0835  BP: 105/69 (!) 124/93 100/71   Pulse: 73 75 65   Resp: '17 10 19   '$ Temp:    98.4 F (36.9 C)  TempSrc:    Oral  SpO2: 96% 96% 92%   Weight:      Height:        Intake/Output Summary (Last 24 hours) at 04/02/2021 0900 Last data filed at 04/02/2021 0800 Gross per 24 hour  Intake 1037.57 ml  Output 1125 ml  Net -87.43 ml   Last 3 Weights 04/02/2021 04/01/2021 03/31/2021  Weight (lbs) 200 lb 9.9 oz 199 lb 4.7 oz 200 lb  Weight (kg) 91 kg 90.4 kg 90.719 kg      Telemetry    Normal sinus rhythm- Personally Reviewed  ECG    Normal sinus rhythm, inferior Q waves, anterior ST depressions, lateral ST elevation reduced- Personally  Reviewed  Physical Exam   GEN: No acute distress.   Neck: No JVD Cardiac: RRR, no murmurs, rubs, or gallops.  Respiratory: Clear to auscultation bilaterally. GI: Soft, nontender, non-distended  MS: No edema; No deformity.  Right groin site without hematoma, 2+ right posterior tibial pulse Neuro:  Nonfocal  Psych: Normal affect   Labs    High Sensitivity Troponin:   Recent Labs  Lab 03/31/21 0752 03/31/21 1114 03/31/21 1439 03/31/21 1629  TROPONINIHS 261* 3,554* 12,036* >24,000*     Chemistry Recent Labs  Lab 03/31/21 0752 03/31/21 0756 04/01/21 0045 04/02/21 0124  NA 140 141 137 133*  K 4.3 4.0 3.8 3.7  CL 105 107 105 101  CO2 23  --  21* 21*  GLUCOSE 108* 112* 145* 124*  BUN 25* 26* 20 18  CREATININE 1.68* 1.60* 1.52* 1.36*  CALCIUM 9.3  --  8.7* 8.3*  PROT 7.5  --   --   --   ALBUMIN 4.0  --   --   --   AST 36  --   --   --   ALT 23  --   --   --  ALKPHOS 72  --   --   --   BILITOT 1.1  --   --   --   GFRNONAA 45*  --  50* 57*  ANIONGAP 12  --  11 11    Lipids  Recent Labs  Lab 03/31/21 0752  CHOL 168  TRIG 385*  HDL 30*  LDLCALC 61  CHOLHDL 5.6    Hematology Recent Labs  Lab 03/31/21 0752 03/31/21 0756 04/01/21 0045 04/02/21 0124  WBC 15.3*  --  17.8* 15.8*  RBC 5.32  --  5.17 5.09  HGB 16.2 17.0 16.2 15.5  HCT 50.3 50.0 47.3 47.2  MCV 94.5  --  91.5 92.7  MCH 30.5  --  31.3 30.5  MCHC 32.2  --  34.2 32.8  RDW 13.6  --  13.8 14.0  PLT 323  --  290 247   Thyroid No results for input(s): TSH, FREET4 in the last 168 hours.  BNPNo results for input(s): BNP, PROBNP in the last 168 hours.  DDimer No results for input(s): DDIMER in the last 168 hours.   Radiology    DG Chest Port 1 View  Result Date: 03/31/2021 CLINICAL DATA:  Acute ST-elevation EXAM: PORTABLE CHEST 1 VIEW COMPARISON:  June 24, 2020 FINDINGS: Stable pacemaker. Stable cardiomegaly. The hila, mediastinum, lungs, and pleura are normal. IMPRESSION: No active disease.  Electronically Signed   By: Dorise Bullion III M.D.   On: 03/31/2021 10:15   ECHOCARDIOGRAM COMPLETE  Result Date: 04/01/2021    ECHOCARDIOGRAM REPORT   Patient Name:   ZUHAYR Gaida Date of Exam: 04/01/2021 Medical Rec #:  [id number]      Height:       67.0 in Accession #:    [id number]     Weight:       199.3 lb Date of Birth:  1956-05-05     BSA:          2.019 m Patient Age:    65 years       BP:           119/80 mmHg Patient Gender: M              HR:           56 bpm. Exam Location:  Inpatient Procedure: 2D Echo, Cardiac Doppler, Color Doppler and Intracardiac            Opacification Agent Indications:     Acute myocardial infarction                  acute ischemic heart disease  History:         Patient has prior history of Echocardiogram examinations, most                  recent 08/25/2020. Previous Myocardial Infarction and CAD,                  Pacemaker and Prior CABG, Arrythmias:Atrial Fibrillation; Risk                  Factors:Dyslipidemia, Hypertension and Former Smoker. NSVT. Hx                  stroke.  Sonographer:     Clayton Lefort RDCS (AE) Referring Phys:  [place] Diagnosing Phys: Eleonore Chiquito MD IMPRESSIONS  1. EF unchanged 20-25%. Severe hypokinesis/akinesis of the entire lateral wall. EF appears similar to prior. LVID is now severely dilated. WMA appear to  be new.  2. Left ventricular ejection fraction, by estimation, is 20 to 25%. The left ventricle has severely decreased function. The left ventricle demonstrates regional wall motion abnormalities (see scoring diagram/findings for description). The left ventricular internal cavity size was severely dilated. Left ventricular diastolic parameters are consistent with Grade II diastolic dysfunction (pseudonormalization).  3. Right ventricular systolic function is moderately reduced. The right ventricular size is normal. Tricuspid regurgitation signal is inadequate for assessing PA pressure.  4. Left atrial size was moderately  dilated.  5. The mitral valve is grossly normal. Moderate mitral valve regurgitation. No evidence of mitral stenosis.  6. The aortic valve is tricuspid. Aortic valve regurgitation is not visualized. No aortic stenosis is present.  7. The inferior vena cava is normal in size with greater than 50% respiratory variability, suggesting right atrial pressure of 3 mmHg. FINDINGS  Left Ventricle: Left ventricular ejection fraction, by estimation, is 20 to 25%. The left ventricle has severely decreased function. The left ventricle demonstrates regional wall motion abnormalities. Definity contrast agent was given IV to delineate the left ventricular endocardial borders. The left ventricular internal cavity size was severely dilated. There is no left ventricular hypertrophy. Left ventricular diastolic parameters are consistent with Grade II diastolic dysfunction (pseudonormalization).  LV Wall Scoring: The entire lateral wall is akinetic. Right Ventricle: The right ventricular size is normal. No increase in right ventricular wall thickness. Right ventricular systolic function is moderately reduced. Tricuspid regurgitation signal is inadequate for assessing PA pressure. Left Atrium: Left atrial size was moderately dilated. Right Atrium: Right atrial size was normal in size. Pericardium: Trivial pericardial effusion is present. Mitral Valve: The mitral valve is grossly normal. Moderate mitral valve regurgitation. No evidence of mitral valve stenosis. Tricuspid Valve: The tricuspid valve is grossly normal. Tricuspid valve regurgitation is not demonstrated. No evidence of tricuspid stenosis. Aortic Valve: The aortic valve is tricuspid. Aortic valve regurgitation is not visualized. No aortic stenosis is present. Aortic valve mean gradient measures 3.0 mmHg. Aortic valve peak gradient measures 6.0 mmHg. Aortic valve area, by VTI measures 2.36 cm. Pulmonic Valve: The pulmonic valve was grossly normal. Pulmonic valve regurgitation is  not visualized. No evidence of pulmonic stenosis. Aorta: The aortic root and ascending aorta are structurally normal, with no evidence of dilitation. Venous: The inferior vena cava is normal in size with greater than 50% respiratory variability, suggesting right atrial pressure of 3 mmHg. IAS/Shunts: The atrial septum is grossly normal. Additional Comments: A device lead is visualized in the right atrium and right ventricle.  LEFT VENTRICLE PLAX 2D LVIDd:         4.50 cm LVIDs:         3.20 cm LV PW:         1.30 cm LV IVS:        1.30 cm LVOT diam:     1.90 cm LV SV:         41 LV SV Index:   20 LVOT Area:     2.84 cm  RIGHT VENTRICLE             IVC RV Basal diam:  2.00 cm     IVC diam: 1.70 cm RV S prime:     13.90 cm/s TAPSE (M-mode): 1.6 cm LEFT ATRIUM             Index       RIGHT ATRIUM           Index LA diam:  2.80 cm 1.39 cm/m  RA Area:     11.40 cm LA Vol (A2C):   16.7 ml 8.27 ml/m  RA Volume:   20.90 ml  10.35 ml/m LA Vol (A4C):   20.4 ml 10.10 ml/m LA Biplane Vol: 19.1 ml 9.46 ml/m  AORTIC VALVE AV Area (Vmax):    2.01 cm AV Area (Vmean):   2.21 cm AV Area (VTI):     2.36 cm AV Vmax:           122.00 cm/s AV Vmean:          76.600 cm/s AV VTI:            0.173 m AV Peak Grad:      6.0 mmHg AV Mean Grad:      3.0 mmHg LVOT Vmax:         86.40 cm/s LVOT Vmean:        59.800 cm/s LVOT VTI:          0.144 m LVOT/AV VTI ratio: 0.83  AORTA Ao Root diam: 3.30 cm Ao Asc diam:  3.30 cm  SHUNTS Systemic VTI:  0.14 m Systemic Diam: 1.90 cm Eleonore Chiquito MD Electronically signed by Eleonore Chiquito MD Signature Date/Time: 04/01/2021/3:25:22 PM    Final (Updated)     Cardiac Studies   Cath films personally reviewed showing extensive thrombus in vein graft to OM.  ECG this morning shows slightly less pronounced ST elevation laterally.  Normal sinus rhythm.  Patient Profile     65 y.o. male with inferolateral MI  Assessment & Plan    Inferolateral/posterior MI: Low likelihood of success if PCI  had been attempted on occluded vein graft on Saturday.  I did discuss the case with Dr. Ellyn Hack.  Given that he is stable, I do not think we need to pursue another heart cath to check LIMA to LAD.  I think the likelihood of success on intervention of the vein graft is also quite low.  Elevated troponin level so it appears the damage has already been done.  Discussed this with the patient and he is in agreement.  We will cancel catheterization for today.  Stop heparin and transition him back to his Eliquis.  Ischemic cardiomyopathy: Chronic systolic heart failure with ejection fraction noted to be 25 to 30% in February 2022.  Has been treated with Toprol, hydralazine and Entresto.  Will stop hydralazine and add spironolactone given low ejection fraction.Add Wilder Glade if possible.   Ventricular tachycardia: Has been on amiodarone for atrial fibrillation.  Hyperlipidemia: LDL 61 on atorvastatin and Zetia:  CKD stage IIIb: Creatinine down to 1.36.  This would be another reason to try to avoid additional dye administration.    Plan to transfer to telemetry later today.      For questions or updates, please contact [place] Please consult [email or web address] for contact info under        Signed, Larae Grooms, MD  04/02/2021, 9:00 AM

## 2021-04-02 NOTE — Progress Notes (Addendum)
[id number]: Notified PA Goodrich of AM EKG result. Patient denies CP, endorses some shoulder discomfort but rates 2/10, nothing like the pain he came in with. Awaiting call back.   [id number]: Received call back- PA reports this appears to be evolving changes from admitting STEMI. No new orders.

## 2021-04-02 NOTE — Discharge Instructions (Signed)

## 2021-04-03 ENCOUNTER — Encounter: Payer: Medicare HMO | Admitting: Cardiology

## 2021-04-03 ENCOUNTER — Other Ambulatory Visit (HOSPITAL_COMMUNITY): Payer: Self-pay

## 2021-04-03 DIAGNOSIS — I10 Essential (primary) hypertension: Secondary | ICD-10-CM | POA: Diagnosis not present

## 2021-04-03 DIAGNOSIS — Z95810 Presence of automatic (implantable) cardiac defibrillator: Secondary | ICD-10-CM | POA: Diagnosis not present

## 2021-04-03 DIAGNOSIS — I2510 Atherosclerotic heart disease of native coronary artery without angina pectoris: Secondary | ICD-10-CM | POA: Diagnosis not present

## 2021-04-03 DIAGNOSIS — I2119 ST elevation (STEMI) myocardial infarction involving other coronary artery of inferior wall: Secondary | ICD-10-CM | POA: Diagnosis not present

## 2021-04-03 LAB — CBC
HCT: 45.5 % (ref 39.0–52.0)
Hemoglobin: 14.8 g/dL (ref 13.0–17.0)
MCH: 30.6 pg (ref 26.0–34.0)
MCHC: 32.5 g/dL (ref 30.0–36.0)
MCV: 94.2 fL (ref 80.0–100.0)
Platelets: 228 10*3/uL (ref 150–400)
RBC: 4.83 MIL/uL (ref 4.22–5.81)
RDW: 14.1 % (ref 11.5–15.5)
WBC: 14.2 10*3/uL — ABNORMAL HIGH (ref 4.0–10.5)
nRBC: 0 % (ref 0.0–0.2)

## 2021-04-03 LAB — BASIC METABOLIC PANEL
Anion gap: 12 (ref 5–15)
BUN: 19 mg/dL (ref 8–23)
CO2: 20 mmol/L — ABNORMAL LOW (ref 22–32)
Calcium: 8.4 mg/dL — ABNORMAL LOW (ref 8.9–10.3)
Chloride: 102 mmol/L (ref 98–111)
Creatinine, Ser: 1.45 mg/dL — ABNORMAL HIGH (ref 0.61–1.24)
GFR, Estimated: 53 mL/min — ABNORMAL LOW (ref >60)
Glucose, Bld: 101 mg/dL — ABNORMAL HIGH (ref 70–99)
Potassium: 3.7 mmol/L (ref 3.5–5.1)
Sodium: 134 mmol/L — ABNORMAL LOW (ref 135–145)

## 2021-04-03 LAB — POCT I-STAT, CHEM 8
BUN: 24 mg/dL — ABNORMAL HIGH (ref 8–23)
Calcium, Ion: 1.19 mmol/L (ref 1.15–1.40)
Chloride: 106 mmol/L (ref 98–111)
Creatinine, Ser: 1.5 mg/dL — ABNORMAL HIGH (ref 0.61–1.24)
Glucose, Bld: 115 mg/dL — ABNORMAL HIGH (ref 70–99)
HCT: 46 % (ref 39.0–52.0)
Hemoglobin: 15.6 g/dL (ref 13.0–17.0)
Potassium: 3.9 mmol/L (ref 3.5–5.1)
Sodium: 141 mmol/L (ref 135–145)
TCO2: 23 mmol/L (ref 22–32)

## 2021-04-03 LAB — PROCALCITONIN: Procalcitonin: 36.84 ng/mL

## 2021-04-03 MED ORDER — DAPAGLIFLOZIN PROPANEDIOL 10 MG PO TABS
10.0000 mg | ORAL_TABLET | Freq: Every day | ORAL | 6 refills | Status: DC
  Filled 2021-04-03: qty 30, 30d supply, fill #0

## 2021-04-03 MED ORDER — SPIRONOLACTONE 25 MG PO TABS
12.5000 mg | ORAL_TABLET | Freq: Every day | ORAL | 6 refills | Status: DC
  Filled 2021-04-03: qty 30, 60d supply, fill #0

## 2021-04-03 MED ORDER — CLOPIDOGREL BISULFATE 75 MG PO TABS
75.0000 mg | ORAL_TABLET | Freq: Every day | ORAL | 11 refills | Status: DC
  Filled 2021-04-03: qty 30, 30d supply, fill #0

## 2021-04-03 MED ORDER — ASPIRIN 81 MG PO TBEC
81.0000 mg | DELAYED_RELEASE_TABLET | Freq: Every day | ORAL | 0 refills | Status: DC
  Filled 2021-04-03: qty 30, 30d supply, fill #0

## 2021-04-03 MED ORDER — METOPROLOL SUCCINATE ER 25 MG PO TB24
25.0000 mg | ORAL_TABLET | Freq: Every day | ORAL | 6 refills | Status: DC
  Filled 2021-04-03: qty 30, 30d supply, fill #0

## 2021-04-03 NOTE — Discharge Summary (Addendum)
Discharge Summary    Patient ID: [name] MRN: [id number]; DOB: 29-May-1956  Admit date: 03/31/2021 Discharge date: 04/03/2021  PCP:  [name] Sake, MD   [facility] Providers Cardiologist:  [name] Klein, MD  Electrophysiologist:  [name] Epley, MD  {  Discharge Diagnoses    Principal Problem:   Acute ST elevation myocardial infarction (STEMI) involving other coronary artery of inferior wall ([place]) Active Problems:   Essential hypertension   Hx of CABG   Hyperlipidemia with target LDL less than 70   CAD S/P percutaneous coronary angioplasty   Ischemic cardiomyopathy   Cardiac defibrillator in situ   CAD, multiple vessel -> severe native vessel CAD-CABG 2014   History of pulmonary embolus (PE) - on Eliquis   PAF (paroxysmal atrial fibrillation) ([place])  Diagnostic Studies/Procedures    Echo 04/01/21  1. EF unchanged 20-25%. Severe hypokinesis/akinesis of the entire lateral  wall. EF appears similar to prior. LVID is now severely dilated. WMA  appear to be new.   2. Left ventricular ejection fraction, by estimation, is 20 to 25%. The  left ventricle has severely decreased function. The left ventricle  demonstrates regional wall motion abnormalities (see scoring  diagram/findings for description). The left  ventricular internal cavity size was severely dilated. Left ventricular  diastolic parameters are consistent with Grade II diastolic dysfunction  (pseudonormalization).   3. Right ventricular systolic function is moderately reduced. The right  ventricular size is normal. Tricuspid regurgitation signal is inadequate  for assessing PA pressure.   4. Left atrial size was moderately dilated.   5. The mitral valve is grossly normal. Moderate mitral valve  regurgitation. No evidence of mitral stenosis.   6. The aortic valve is tricuspid. Aortic valve regurgitation is not  visualized. No aortic stenosis is present.   7. The inferior vena cava is normal in size  with greater than 50%  respiratory variability, suggesting right atrial pressure of 3 mmHg.   LEFT HEART CATH AND CORS/GRAFTS ANGIOGRAPHY   Conclusion      Mid LAD lesion is 100% stenosed.   Ost Cx to Prox Cx lesion is 100% stenosed.   Prox RCA to Dist RCA lesion is 100% stenosed.   Dist RCA lesion is 20% stenosed.   Origin to Prox Graft lesion is 30% stenosed.   RPDA lesion is 30% stenosed.   Prox Graft to Insertion lesion is 100% stenosed.   LIMA graft was not visualized due to inability to cannulate.   LV end diastolic pressure is severely elevated.   There is no aortic valve stenosis.   SUMMARY Severe multivessel native CAD: Known CTO of mid LAD, proximal RCA and proximal LCx Culprit lesion is extensively thrombotic occlusion of the SVG-OM1 with dye staining after 1 injection throughout the entire case. Assumed patent LIMA-LAD (unable to engage) Patent SVG-RCA with patent stent and previous anastomosis PTCA Known ejection fraction of 25 to 30% (no LV gram performed to conserve contrast), -> LVEDP of 25 mmHg suggests ACUTE ON CHRONIC DIASTOLIC HEART FAILURE in setting of known CHRONIC COMBINED SYSTOLIC AND DIASTOLIC HEART FAILURE   PLAN: Based on the severity of the SVG occlusion with extensive thrombus/Promus noted and no flow reaching the native vessel, I assessed the patient symptoms.  He was not having chest pain.  I determined that attempted intervention on this vessel at this time would likely lead to distal embolization and occlusion of the native LCx. We will run Aggrastat x12 hours and continue home dose of Plavix Restart  IV heparin 6 hours after sheath removal (hold Eliquis) Consider relook catheterization on Monday, 04/02/2021 to determine if there is any flow down the SVG or potential PCI of the native LCx which does not appear to be viable.   Would also evaluate LIMA-LAD via the left radial access. With elevated EDP and known reduced EF, check 2D echocardiogram and  give at least 1 dose IV Lasix today.  Reassess in the morning. Gentle post-cath hydration follow renal function. Continue home medications including Entresto, Toprol, amiodarone    Diagnostic Dominance: Right  History of Present Illness     [name] is a 65 y.o. male with past medical history of multivessel CAD-CABG in February 2014 (Dr. Cyndia [name]: LIMA-LAD, SVG-D RCA, SVG-OM1) with 2 successive inferior STEMI's involving the SVG-RCA (September 2020 PCI of anastomosis and October 2021-PTCA of ISR thrombosis), chronic combined CHF/ICM, PAF, hx of PE, HTN, CKD III and HLD admitted inferior STEMI.   [name] was in his usual state of health feeling relatively well up until the morning of 03/31/2021 when he awoke at roughly 4 to 4:30 in the morning with severe 8-10 out of 10 chest and back pain with some mild nausea and dyspnea.  He initially did not want to go to the emergency room or call EMS.  Finally after several hours, the wife convinced him to go to the local fire department where they did an EKG showing possible inferior STEMI.  EMS was contacted, and code STEMI was called.  Upon [facility] to [facility] emergency room, he was noting minimal 2/10 chest pain that resolved shortly after arrival. Last dose of Eliquis was yesterday evening (03/30/2021).   About the only thing that the wife notes is that he has been less active than usual over the last several months, sleeps a lot during the day, and has not mentioned anything like recurrent chest discomfort or dyspnea, worsening exertional dyspnea, PND or orthopnea.  EKG shows subtle (1 to 2 mm) inferior elevations with frequent ectopy, and prominent ST depressions in septal leads V1 through V3 concerning for possible posterior STEMI.  Hospital Course     Consultants: None   STEMI - Emergent cath showed severe native vessel CAD, probable patent LIMA to LAD (unable to engage), patent SVG to RCA with patent stent and previous anastomotic  PTCA, culprit lesion was extensively thrombotic occlusion of the SVG-OM that was not felt to be salvageable without potentially risking distal embolization into the native vessel. He was treated with Aggrastat and then IV heparin. Initial plan was to do re-look cath on Monday but deferred due to  likelihood of success on intervention of the vein graft is also quite low. Case was discussed between Dr. Irish Lack and Kaleva who did initial cath.  -  Treated with ASA and Plavix. Resumed Eliquis.  - Plan to stop ASA in 1 month and continue Plavix and Eliquis - Continue statin and BB  2. ICM/Chronic systolic CHF - LVEF of [id number] in 08/2020 - Echo this admission showed LVEF of 20-25% with new WM abnormality and grade II DD -  Has been treated with Toprol, hydralazine and Entresto PTA.  - Stopped Hydralazine - Added SGLT2 inhinitor  - Continue Toprol, spironolactone and Entresto >> titrate medications based on BP and renal function   3. PAF - Continue Amiodarone and Eliquis  4. HLD - 03/31/2021: Cholesterol 168; HDL 30; LDL Cholesterol 61; Triglycerides 385; VLDL 77  - Continue high intensity statin and Zetia   5.  CKD III - Scr stable at baseline of 1.3-1.5  Did the patient have an acute coronary syndrome (MI, NSTEMI, STEMI, etc) this admission?:  Yes                               AHA/ACC Clinical Performance & Quality Measures: Aspirin prescribed? - Yes ADP Receptor Inhibitor (Plavix/Clopidogrel, Brilinta/Ticagrelor or Effient/Prasugrel) prescribed (includes medically managed patients)? - Yes Beta Blocker prescribed? - Yes High Intensity Statin (Lipitor 40-'80mg'$  or Crestor 20-'40mg'$ ) prescribed? - Yes EF assessed during THIS hospitalization? - Yes For EF <40%, was ACEI/ARB prescribed? - Yes For EF <40%, Aldosterone Antagonist (Spironolactone or Eplerenone) prescribed? - Yes Cardiac Rehab Phase II ordered (including medically managed patients)? - Yes      _____________  Discharge  Vitals Blood pressure (!) 89/56, pulse 74, temperature 98.5 F (36.9 C), temperature source Oral, resp. rate 20, height '5\' 7"'$  (1.702 m), weight 91 kg, SpO2 92 %.  Filed Weights   03/31/21 0746 04/01/21 0600 04/02/21 0500  Weight: 90.7 kg 90.4 kg 91 kg    Labs & Radiologic Studies    CBC Recent Labs    04/02/21 1828 04/03/21 0116  WBC 15.2* 14.2*  NEUTROABS 11.1*  --   HGB 15.2 14.8  HCT 45.3 45.5  MCV 93.0 94.2  PLT 226 [id number]   Basic Metabolic Panel Recent Labs    04/02/21 1828 04/03/21 0116  NA 134* 134*  K 4.0 3.7  CL 102 102  CO2 23 20*  GLUCOSE 119* 101*  BUN 21 19  CREATININE 1.50* 1.45*  CALCIUM 8.4* 8.4*   Liver Function Tests Recent Labs    04/02/21 1828  AST 131*  ALT 34  ALKPHOS 56  BILITOT 1.3*  PROT 6.4*  ALBUMIN 3.1*   No results for input(s): LIPASE, AMYLASE in the last 72 hours. High Sensitivity Troponin:   Recent Labs  Lab 03/31/21 0752 03/31/21 1114 03/31/21 1439 03/31/21 1629  TROPONINIHS 261* 3,554* 12,036* >24,000*     _____________  CARDIAC CATHETERIZATION  Result Date: 03/31/2021   Mid LAD lesion is 100% stenosed.   Ost Cx to Prox Cx lesion is 100% stenosed.   Prox RCA to Dist RCA lesion is 100% stenosed.   Dist RCA lesion is 20% stenosed.   Origin to Prox Graft lesion is 30% stenosed.   RPDA lesion is 30% stenosed.   Prox Graft to Insertion lesion is 100% stenosed.   LIMA graft was not visualized due to inability to cannulate.   LV end diastolic pressure is severely elevated.   There is no aortic valve stenosis. SUMMARY Severe multivessel native CAD: Known CTO of mid LAD, proximal RCA and proximal LCx Culprit lesion is extensively thrombotic occlusion of the SVG-OM1 with dye staining after 1 injection throughout the entire case. Assumed patent LIMA-LAD (unable to engage) Patent SVG-RCA with patent stent and previous anastomosis PTCA Known ejection fraction of 25 to 30% (no LV gram performed to conserve contrast), -> LVEDP of 25 mmHg  suggests ACUTE ON CHRONIC DIASTOLIC HEART FAILURE in setting of known CHRONIC COMBINED SYSTOLIC AND DIASTOLIC HEART FAILURE PLAN: Based on the severity of the SVG occlusion with extensive thrombus/Promus noted and no flow reaching the native vessel, I assessed the patient symptoms.  He was not having chest pain.  I determined that attempted intervention on this vessel at this time would likely lead to distal embolization and occlusion of the native LCx. We will run  Aggrastat x12 hours and continue home dose of Plavix Restart IV heparin 6 hours after sheath removal (hold Eliquis) Consider relook catheterization on Monday, 04/02/2021 to determine if there is any flow down the SVG or potential PCI of the native LCx which does not appear to be viable.  Would also evaluate LIMA-LAD via the left radial access. With elevated EDP and known reduced EF, check 2D echocardiogram and give at least 1 dose IV Lasix today.  Reassess in the morning. Gentle post-cath hydration follow renal function. Continue home medications including Entresto, Toprol, amiodarone   DG CHEST PORT 1 VIEW  Result Date: 04/02/2021 CLINICAL DATA:  Fevers EXAM: PORTABLE CHEST 1 VIEW COMPARISON:  03/31/2021 FINDINGS: Cardiac shadow is mildly prominent but accentuated by the portable technique. Defibrillator is again noted as well as postsurgical changes. The lungs are clear. No bony abnormality is noted. IMPRESSION: No active disease. Electronically Signed   By: Inez Catalina M.D.   On: 04/02/2021 19:31   DG Chest Port 1 View  Result Date: 03/31/2021 CLINICAL DATA:  Acute ST-elevation EXAM: PORTABLE CHEST 1 VIEW COMPARISON:  June 24, 2020 FINDINGS: Stable pacemaker. Stable cardiomegaly. The hila, mediastinum, lungs, and pleura are normal. IMPRESSION: No active disease. Electronically Signed   By: Dorise Bullion III M.D.   On: 03/31/2021 10:15   ECHOCARDIOGRAM COMPLETE  Result Date: 04/01/2021    ECHOCARDIOGRAM REPORT   Patient Name:   [name]  [name] Date of Exam: 04/01/2021 Medical Rec #:  [id number]      Height:       67.0 in Accession #:    [id number]     Weight:       199.3 lb Date of Birth:  1956/06/23     BSA:          2.019 m Patient Age:    43 years       BP:           119/80 mmHg Patient Gender: M              HR:           56 bpm. Exam Location:  Inpatient Procedure: 2D Echo, Cardiac Doppler, Color Doppler and Intracardiac            Opacification Agent Indications:     Acute myocardial infarction                  acute ischemic heart disease  History:         Patient has prior history of Echocardiogram examinations, most                  recent 08/25/2020. Previous Myocardial Infarction and CAD,                  Pacemaker and Prior CABG, Arrythmias:Atrial Fibrillation; Risk                  Factors:Dyslipidemia, Hypertension and Former Smoker. NSVT. Hx                  stroke.  Sonographer:     Clayton Lefort RDCS (AE) Referring Phys:  [place] Diagnosing Phys: Eleonore Chiquito MD IMPRESSIONS  1. EF unchanged 20-25%. Severe hypokinesis/akinesis of the entire lateral wall. EF appears similar to prior. LVID is now severely dilated. WMA appear to be new.  2. Left ventricular ejection fraction, by estimation, is 20 to 25%. The left ventricle has severely decreased function. The left ventricle  demonstrates regional wall motion abnormalities (see scoring diagram/findings for description). The left ventricular internal cavity size was severely dilated. Left ventricular diastolic parameters are consistent with Grade II diastolic dysfunction (pseudonormalization).  3. Right ventricular systolic function is moderately reduced. The right ventricular size is normal. Tricuspid regurgitation signal is inadequate for assessing PA pressure.  4. Left atrial size was moderately dilated.  5. The mitral valve is grossly normal. Moderate mitral valve regurgitation. No evidence of mitral stenosis.  6. The aortic valve is tricuspid. Aortic valve regurgitation is  not visualized. No aortic stenosis is present.  7. The inferior vena cava is normal in size with greater than 50% respiratory variability, suggesting right atrial pressure of 3 mmHg. FINDINGS  Left Ventricle: Left ventricular ejection fraction, by estimation, is 20 to 25%. The left ventricle has severely decreased function. The left ventricle demonstrates regional wall motion abnormalities. Definity contrast agent was given IV to delineate the left ventricular endocardial borders. The left ventricular internal cavity size was severely dilated. There is no left ventricular hypertrophy. Left ventricular diastolic parameters are consistent with Grade II diastolic dysfunction (pseudonormalization).  LV Wall Scoring: The entire lateral wall is akinetic. Right Ventricle: The right ventricular size is normal. No increase in right ventricular wall thickness. Right ventricular systolic function is moderately reduced. Tricuspid regurgitation signal is inadequate for assessing PA pressure. Left Atrium: Left atrial size was moderately dilated. Right Atrium: Right atrial size was normal in size. Pericardium: Trivial pericardial effusion is present. Mitral Valve: The mitral valve is grossly normal. Moderate mitral valve regurgitation. No evidence of mitral valve stenosis. Tricuspid Valve: The tricuspid valve is grossly normal. Tricuspid valve regurgitation is not demonstrated. No evidence of tricuspid stenosis. Aortic Valve: The aortic valve is tricuspid. Aortic valve regurgitation is not visualized. No aortic stenosis is present. Aortic valve mean gradient measures 3.0 mmHg. Aortic valve peak gradient measures 6.0 mmHg. Aortic valve area, by VTI measures 2.36 cm. Pulmonic Valve: The pulmonic valve was grossly normal. Pulmonic valve regurgitation is not visualized. No evidence of pulmonic stenosis. Aorta: The aortic root and ascending aorta are structurally normal, with no evidence of dilitation. Venous: The inferior vena cava  is normal in size with greater than 50% respiratory variability, suggesting right atrial pressure of 3 mmHg. IAS/Shunts: The atrial septum is grossly normal. Additional Comments: A device lead is visualized in the right atrium and right ventricle.  LEFT VENTRICLE PLAX 2D LVIDd:         4.50 cm LVIDs:         3.20 cm LV PW:         1.30 cm LV IVS:        1.30 cm LVOT diam:     1.90 cm LV SV:         41 LV SV Index:   20 LVOT Area:     2.84 cm  RIGHT VENTRICLE             IVC RV Basal diam:  2.00 cm     IVC diam: 1.70 cm RV S prime:     13.90 cm/s TAPSE (M-mode): 1.6 cm LEFT ATRIUM             Index       RIGHT ATRIUM           Index LA diam:        2.80 cm 1.39 cm/m  RA Area:     11.40 cm LA Vol (A2C):   16.7 ml 8.27  ml/m  RA Volume:   20.90 ml  10.35 ml/m LA Vol (A4C):   20.4 ml 10.10 ml/m LA Biplane Vol: 19.1 ml 9.46 ml/m  AORTIC VALVE AV Area (Vmax):    2.01 cm AV Area (Vmean):   2.21 cm AV Area (VTI):     2.36 cm AV Vmax:           122.00 cm/s AV Vmean:          76.600 cm/s AV VTI:            0.173 m AV Peak Grad:      6.0 mmHg AV Mean Grad:      3.0 mmHg LVOT Vmax:         86.40 cm/s LVOT Vmean:        59.800 cm/s LVOT VTI:          0.144 m LVOT/AV VTI ratio: 0.83  AORTA Ao Root diam: 3.30 cm Ao Asc diam:  3.30 cm  SHUNTS Systemic VTI:  0.14 m Systemic Diam: 1.90 cm Eleonore Chiquito MD Electronically signed by Eleonore Chiquito MD Signature Date/Time: 04/01/2021/3:25:22 PM    Final (Updated)    Disposition   Pt is being discharged home today in good condition.  Follow-up Plans & Appointments     Follow-up Information     Led[facility] Bottcher, [place] Follow up on 04/16/2021.   Specialties: Librarian, academic, Cardiology, Radiology Why: '@8'$ :71fr hospital follow up Contact information: [address] [place] [phone]               Discharge Instructions     Amb Referral to Cardiac Rehabilitation   Complete by: As directed    Diagnosis: STEMI   After initial  evaluation and assessments completed: Virtual Based Care may be provided alone or in conjunction with Phase 2 Cardiac Rehab based on patient barriers.: Yes   Diet - low sodium heart healthy   Complete by: As directed    Discharge instructions   Complete by: As directed    No driving for 2 weeks. No lifting over 10 lbs for 4 weeks. No sexual activity for 4 weeks. You may not return to work until cleared by your cardiologist. Keep procedure site clean & dry. If you notice increased pain, swelling, bleeding or pus, call/return!  You may shower, but no soaking baths/hot tubs/pools for 1 week.   Increase activity slowly   Complete by: As directed        Discharge Medications   Allergies as of 04/03/2021   No Known Allergies      Medication List     STOP taking these medications    hydrALAZINE 10 MG tablet Commonly known as: APRESOLINE       TAKE these medications    acetaminophen 325 MG tablet Commonly known as: TYLENOL Take 2 tablets (650 mg total) by mouth every 4 (four) hours as needed for mild pain or headache.   amiodarone 200 MG tablet Commonly known as: PACERONE TAKE 1 TABLET BY MOUTH 2 TIMES DAILY.   apixaban 5 MG Tabs tablet Commonly known as: Eliquis Take 1 tablet (5 mg total) by mouth 2 (two) times daily.   aspirin 81 MG EC tablet Take 1 tablet (81 mg total) by mouth daily. Swallow whole. Start taking on: April 04, 2021   atorvastatin 80 MG tablet Commonly known as: LIPITOR TAKE 1 TABLET BY MOUTH DAILY AT 6 PM. What changed:  how much to take how to  take this when to take this additional instructions   clopidogrel 75 MG tablet Commonly known as: PLAVIX Take 1 tablet (75 mg total) by mouth daily. Start taking on: April 04, 2021   dapagliflozin propanediol 10 MG Tabs tablet Commonly known as: FARXIGA Take 1 tablet (10 mg total) by mouth daily. Start taking on: April 04, 2021   escitalopram 10 MG tablet Commonly known as: Lexapro Take  1 tablet (10 mg total) by mouth daily.   ezetimibe 10 MG tablet Commonly known as: ZETIA Take 1 tablet (10 mg total) by mouth daily.   metoprolol succinate 25 MG 24 hr tablet Commonly known as: TOPROL-XL Take 1 tablet (25 mg total) by mouth daily. What changed: Another medication with the same name was removed. Continue taking this medication, and follow the directions you see here.   nitroGLYCERIN 0.4 MG SL tablet Commonly known as: NITROSTAT Place 1 tablet (0.4 mg total) under the tongue every 5 (five) minutes x 3 doses as needed for chest pain.   One-A-Day Mens 50+ Advantage Tabs Take 1 tablet by mouth daily with breakfast.   sacubitril-valsartan 24-26 MG Commonly known as: ENTRESTO Take 1 tablet by mouth 2 (two) times daily.   spironolactone 25 MG tablet Commonly known as: ALDACTONE Take 0.5 tablets (12.5 mg total) by mouth daily. Start taking on: April 04, 2021           Outstanding Labs/Studies   BMP at follow up   Duration of Discharge Encounter   Greater than 30 minutes including physician time.  SignedLeanor Kail, PA 04/03/2021, 12:51 PM  I have examined the patient and reviewed assessment and plan and discussed with patient.  Agree with above as stated.    CAD/MI: Continue medical therapy.  No lesion upon which to intervene.  Aspirin, Plavix and Eliquis for 1 month.  Stop aspirin after 1 month and continue Plavix and Eliquis.  Watch for any bleeding signs.   Chronic systolic heart failure: Continue aggressive medical therapy.  SGLT2 inhibitor was also considered per pharmacy.   Fever: I suspect this was just related to his MI.  He was not revascularized.  Procalcitonin level is low.  Blood cultures negative to this point.  No dysuria.  Chest x-ray did not show any kind of pneumonia.   He walked with cardiac rehab today he felt well.  We will plan for discharge later today.    Larae Grooms

## 2021-04-03 NOTE — Progress Notes (Signed)
CARDIAC REHAB PHASE I   PRE:  Rate/Rhythm: 71 SR  BP:  Sitting: 100/73      SaO2: 95 RA  MODE:  Ambulation: 370 ft   POST:  Rate/Rhythm: 88 SR  BP:  Sitting: 97/80    SaO2: 99 RA  Pt ambulated 357f in hallway standby assist with slow, steady gait. Pt denies CP, SOB, or dizziness throughout the walk. Pt returned to EOB. Completed MI/HF education. Pt educated on importance of Eliquis, Plavix, NTG, and statin. Pt given MI and HF booklet along with heart healthy and diabetic diets. Reviewed site care, restrictions, and exercise guidelines. Will refer to CRP II [place].  [id number] TRufina Falco RN BSN 04/03/2021 10:11 AM

## 2021-04-03 NOTE — Progress Notes (Signed)
Heart Failure Navigator Progress Note  Assessed for Heart & Vascular TOC clinic readiness.  Patient does not meet criteria due to AHF clinic follow up 10/7 with Dr. Haroldine Laws.  Navigator available for reassessment of patient.   Pricilla Holm, MSN, RN Heart Failure Nurse Navigator [phone]

## 2021-04-03 NOTE — Research (Addendum)
...    ID- [id number],  [email or web address]  Authorized Site Personnel  '[x]'$   Shirley Muscat, RN     '[]'$   Jasmine Pang, RN  '[]'$   Philemon Kingdom, RN  '[]'$   Amy Enid Derry, RCIS  '[]'$   Berneda Rose, RN   SUBJECT NAME: _[name]_______ MRN: [id number]   Date of study introduction: __20-SEP-2022____   ([id number])    Time of introduction: _1015_______  (24 hour clock)  During the subject's hospital visit , the authorized site personnel discussed with  with the subject the possibility to participate in the SOS-AMI study, and provided  the subject with the approved Subject Information Leaflet (SIL)-Informed Consent form (ICF) in a language that he/she can understand.  The subject took the document home to reflect on his/her participation in this study before signing it.   '[x]'$   The Subject will return to the Cardiovascular Research office, at a later day, to            complete the consenting process.  OR  '[]'$   The Subject will be given ample time to reflect on this study but will be completing            consent process prior to his/ her discharge from this admission.   **Form based on IDORSIA [id number]   *After reading the discharge summary this patient is being sent home on TRIPLE ANTITHROMBOTIC THERAPY ( ASA, Plavix, and Eliquis) so he is not a candidate for this study.

## 2021-04-03 NOTE — Progress Notes (Signed)
Progress Note  Patient Name: [name] Date of Encounter: 04/03/2021  [facility] Cardiologist: Sanda Klein, MD   Subjective   [facility] today with cardiac rehab.  No anginal symptoms.  Inpatient Medications    Scheduled Meds:  amiodarone  200 mg Oral BID   apixaban  5 mg Oral BID   aspirin EC  81 mg Oral Daily   atorvastatin  80 mg Oral Daily   Chlorhexidine Gluconate Cloth  6 each Topical Daily   clopidogrel  75 mg Oral Daily   dapagliflozin propanediol  10 mg Oral Daily   escitalopram  10 mg Oral Daily   ezetimibe  10 mg Oral Daily   melatonin  3 mg Oral QHS   metoprolol succinate  25 mg Oral Daily   sacubitril-valsartan  1 tablet Oral BID   sodium chloride flush  3 mL Intravenous Q12H   spironolactone  12.5 mg Oral Daily   Continuous Infusions:  sodium chloride Stopped (03/31/21 1930)   sodium chloride     sodium chloride 1 mL/kg/hr (04/02/21 0603)   PRN Meds: sodium chloride, acetaminophen, morphine injection, nitroGLYCERIN, ondansetron (ZOFRAN) IV, sodium chloride flush   Vital Signs    Vitals:   04/03/21 0640 04/03/21 0745 04/03/21 0800 04/03/21 0900  BP:   105/78 99/80  Pulse: 68  78 78  Resp: (!) '22  15 20  '$ Temp:  98.6 F (37 C)    TempSrc:  Oral    SpO2: 99%  96% 92%  Weight:      Height:        Intake/Output Summary (Last 24 hours) at 04/03/2021 1051 Last data filed at 04/03/2021 0900 Gross per 24 hour  Intake 1065.69 ml  Output 1300 ml  Net -234.31 ml   Last 3 Weights 04/02/2021 04/01/2021 03/31/2021  Weight (lbs) 200 lb 9.9 oz 199 lb 4.7 oz 200 lb  Weight (kg) 91 kg 90.4 kg 90.719 kg      Telemetry    Normal sinus rhythm- Personally Reviewed  ECG      Physical Exam   GEN: No acute distress.   Neck: No JVD Cardiac: RRR, no murmurs, rubs, or gallops.  Respiratory: Clear to auscultation bilaterally. GI: Soft, nontender, non-distended  MS: No edema; No deformity. Neuro:  Nonfocal  Psych: Normal affect   Labs    High  Sensitivity Troponin:   Recent Labs  Lab 03/31/21 0752 03/31/21 1114 03/31/21 1439 03/31/21 1629  TROPONINIHS 261* 3,554* 12,036* >24,000*     Chemistry Recent Labs  Lab 03/31/21 0752 03/31/21 0756 04/02/21 0124 04/02/21 1828 04/03/21 0116  NA 140   < > 133* 134* 134*  K 4.3   < > 3.7 4.0 3.7  CL 105   < > 101 102 102  CO2 23   < > 21* 23 20*  GLUCOSE 108*   < > 124* 119* 101*  BUN 25*   < > '18 21 19  '$ CREATININE 1.68*   < > 1.36* 1.50* 1.45*  CALCIUM 9.3   < > 8.3* 8.4* 8.4*  PROT 7.5  --   --  6.4*  --   ALBUMIN 4.0  --   --  3.1*  --   AST 36  --   --  131*  --   ALT 23  --   --  34  --   ALKPHOS 72  --   --  56  --   BILITOT 1.1  --   --  1.3*  --  GFRNONAA 45*   < > 57* 51* 53*  ANIONGAP 12   < > '11 9 12   '$ < > = values in this interval not displayed.    Lipids  Recent Labs  Lab 03/31/21 0752  CHOL 168  TRIG 385*  HDL 30*  LDLCALC 61  CHOLHDL 5.6    Hematology Recent Labs  Lab 04/02/21 0124 04/02/21 1828 04/03/21 0116  WBC 15.8* 15.2* 14.2*  RBC 5.09 4.87 4.83  HGB 15.5 15.2 14.8  HCT 47.2 45.3 45.5  MCV 92.7 93.0 94.2  MCH 30.5 31.2 30.6  MCHC 32.8 33.6 32.5  RDW 14.0 14.2 14.1  PLT 247 226 228   Thyroid No results for input(s): TSH, FREET4 in the last 168 hours.  BNPNo results for input(s): BNP, PROBNP in the last 168 hours.  DDimer No results for input(s): DDIMER in the last 168 hours.   Radiology    DG CHEST PORT 1 VIEW  Result Date: 04/02/2021 CLINICAL DATA:  Fevers EXAM: PORTABLE CHEST 1 VIEW COMPARISON:  03/31/2021 FINDINGS: Cardiac shadow is mildly prominent but accentuated by the portable technique. Defibrillator is again noted as well as postsurgical changes. The lungs are clear. No bony abnormality is noted. IMPRESSION: No active disease. Electronically Signed   By: Inez Catalina M.D.   On: 04/02/2021 19:31   ECHOCARDIOGRAM COMPLETE  Result Date: 04/01/2021    ECHOCARDIOGRAM REPORT   Patient Name:   [name] Date of Exam:  04/01/2021 Medical Rec #:  [id number]      Height:       67.0 in Accession #:    [id number]     Weight:       199.3 lb Date of Birth:  [birth year]     BSA:          2.019 m Patient Age:    65 years       BP:           119/80 mmHg Patient Gender: M              HR:           56 bpm. Exam Location:  Inpatient Procedure: 2D Echo, Cardiac Doppler, Color Doppler and Intracardiac            Opacification Agent Indications:     Acute myocardial infarction                  acute ischemic heart disease  History:         Patient has prior history of Echocardiogram examinations, most                  recent 08/25/2020. Previous Myocardial Infarction and CAD,                  Pacemaker and Prior CABG, Arrythmias:Atrial Fibrillation; Risk                  Factors:Dyslipidemia, Hypertension and Former Smoker. NSVT. Hx                  stroke.  Sonographer:     Clayton Lefort RDCS (AE) Referring Phys:  Johnson Diagnosing Phys: Eleonore Chiquito MD IMPRESSIONS  1. EF unchanged 20-25%. Severe hypokinesis/akinesis of the entire lateral wall. EF appears similar to prior. LVID is now severely dilated. WMA appear to be new.  2. Left ventricular ejection fraction, by estimation, is 20 to 25%. The left ventricle has severely decreased function.  The left ventricle demonstrates regional wall motion abnormalities (see scoring diagram/findings for description). The left ventricular internal cavity size was severely dilated. Left ventricular diastolic parameters are consistent with Grade II diastolic dysfunction (pseudonormalization).  3. Right ventricular systolic function is moderately reduced. The right ventricular size is normal. Tricuspid regurgitation signal is inadequate for assessing PA pressure.  4. Left atrial size was moderately dilated.  5. The mitral valve is grossly normal. Moderate mitral valve regurgitation. No evidence of mitral stenosis.  6. The aortic valve is tricuspid. Aortic valve regurgitation is not visualized. No  aortic stenosis is present.  7. The inferior vena cava is normal in size with greater than 50% respiratory variability, suggesting right atrial pressure of 3 mmHg. FINDINGS  Left Ventricle: Left ventricular ejection fraction, by estimation, is 20 to 25%. The left ventricle has severely decreased function. The left ventricle demonstrates regional wall motion abnormalities. Definity contrast agent was given IV to delineate the left ventricular endocardial borders. The left ventricular internal cavity size was severely dilated. There is no left ventricular hypertrophy. Left ventricular diastolic parameters are consistent with Grade II diastolic dysfunction (pseudonormalization).  LV Wall Scoring: The entire lateral wall is akinetic. Right Ventricle: The right ventricular size is normal. No increase in right ventricular wall thickness. Right ventricular systolic function is moderately reduced. Tricuspid regurgitation signal is inadequate for assessing PA pressure. Left Atrium: Left atrial size was moderately dilated. Right Atrium: Right atrial size was normal in size. Pericardium: Trivial pericardial effusion is present. Mitral Valve: The mitral valve is grossly normal. Moderate mitral valve regurgitation. No evidence of mitral valve stenosis. Tricuspid Valve: The tricuspid valve is grossly normal. Tricuspid valve regurgitation is not demonstrated. No evidence of tricuspid stenosis. Aortic Valve: The aortic valve is tricuspid. Aortic valve regurgitation is not visualized. No aortic stenosis is present. Aortic valve mean gradient measures 3.0 mmHg. Aortic valve peak gradient measures 6.0 mmHg. Aortic valve area, by VTI measures 2.36 cm. Pulmonic Valve: The pulmonic valve was grossly normal. Pulmonic valve regurgitation is not visualized. No evidence of pulmonic stenosis. Aorta: The aortic root and ascending aorta are structurally normal, with no evidence of dilitation. Venous: The inferior vena cava is normal in size  with greater than 50% respiratory variability, suggesting right atrial pressure of 3 mmHg. IAS/Shunts: The atrial septum is grossly normal. Additional Comments: A device lead is visualized in the right atrium and right ventricle.  LEFT VENTRICLE PLAX 2D LVIDd:         4.50 cm LVIDs:         3.20 cm LV PW:         1.30 cm LV IVS:        1.30 cm LVOT diam:     1.90 cm LV SV:         41 LV SV Index:   20 LVOT Area:     2.84 cm  RIGHT VENTRICLE             IVC RV Basal diam:  2.00 cm     IVC diam: 1.70 cm RV S prime:     13.90 cm/s TAPSE (M-mode): 1.6 cm LEFT ATRIUM             Index       RIGHT ATRIUM           Index LA diam:        2.80 cm 1.39 cm/m  RA Area:     11.40 cm LA Vol (A2C):  16.7 ml 8.27 ml/m  RA Volume:   20.90 ml  10.35 ml/m LA Vol (A4C):   20.4 ml 10.10 ml/m LA Biplane Vol: 19.1 ml 9.46 ml/m  AORTIC VALVE AV Area (Vmax):    2.01 cm AV Area (Vmean):   2.21 cm AV Area (VTI):     2.36 cm AV Vmax:           122.00 cm/s AV Vmean:          76.600 cm/s AV VTI:            0.173 m AV Peak Grad:      6.0 mmHg AV Mean Grad:      3.0 mmHg LVOT Vmax:         86.40 cm/s LVOT Vmean:        59.800 cm/s LVOT VTI:          0.144 m LVOT/AV VTI ratio: 0.83  AORTA Ao Root diam: 3.30 cm Ao Asc diam:  3.30 cm  SHUNTS Systemic VTI:  0.14 m Systemic Diam: 1.90 cm Eleonore Chiquito MD Electronically signed by Eleonore Chiquito MD Signature Date/Time: 04/01/2021/3:25:22 PM    Final (Updated)     Cardiac Studies   Cath films as noted previously.  Occluded SVG to OM culprit lesion  Patient Profile     65 y.o. male CAD, cardiomyopathy  Assessment & Plan    CAD/MI: Continue medical therapy.  No lesion upon which to intervene.  Aspirin, Plavix and Eliquis for 1 month.  Stop aspirin after 1 month and continue Plavix and Eliquis.  Watch for any bleeding signs.  Chronic systolic heart failure: Continue aggressive medical therapy.  SGLT2 inhibitor was also considered per pharmacy.  Fever: I suspect this was just related  to his MI.  He was not revascularized.  Procalcitonin level is low.  Blood cultures negative to this point.  No dysuria.  Chest x-ray did not show any kind of pneumonia.  He walked with cardiac rehab today he felt well.  We will plan for discharge later today.  For questions or updates, please contact [place] Please consult [email or web address] for contact info under        Signed, Larae Grooms, MD  04/03/2021, 10:51 AM

## 2021-04-03 NOTE — Progress Notes (Signed)
Patient provided with discharge paperwork as well as scrip paperwork from pharmacy for grant. Patient given ampule amount of education in regards to medication compliance at home, heart failure, daily weights, doctor follow ups and when to seek help from a health care provider. IV removed and patient belongings with patient. All questions and concerns addressed.

## 2021-04-03 NOTE — Plan of Care (Signed)
Problem: Education: Goal: Understanding of CV disease, CV risk reduction, and recovery process will improve 04/03/2021 1130 by Fredric Mare, RN Outcome: Adequate for Discharge 04/03/2021 1130 by Fredric Mare, RN Outcome: Progressing Goal: Individualized Educational Video(s) 04/03/2021 1130 by Fredric Mare, RN Outcome: Adequate for Discharge 04/03/2021 1130 by Fredric Mare, RN Outcome: Progressing   Problem: Activity: Goal: Ability to return to baseline activity level will improve 04/03/2021 1130 by Fredric Mare, RN Outcome: Adequate for Discharge 04/03/2021 1130 by Fredric Mare, RN Outcome: Progressing   Problem: Cardiovascular: Goal: Ability to achieve and maintain adequate cardiovascular perfusion will improve 04/03/2021 1130 by Fredric Mare, RN Outcome: Adequate for Discharge 04/03/2021 1130 by Fredric Mare, RN Outcome: Progressing Goal: Vascular access site(s) Level 0-1 will be maintained 04/03/2021 1130 by Fredric Mare, RN Outcome: Adequate for Discharge 04/03/2021 1130 by Fredric Mare, RN Outcome: Progressing   Problem: Health Behavior/Discharge Planning: Goal: Ability to safely manage health-related needs after discharge will improve 04/03/2021 1130 by Fredric Mare, RN Outcome: Adequate for Discharge 04/03/2021 1130 by Fredric Mare, RN Outcome: Progressing   Problem: Education: Goal: Knowledge of General Education information will improve Description: Including pain rating scale, medication(s)/side effects and non-pharmacologic comfort measures 04/03/2021 1130 by Fredric Mare, RN Outcome: Adequate for Discharge 04/03/2021 1130 by Fredric Mare, RN Outcome: Progressing   Problem: Health Behavior/Discharge Planning: Goal: Ability to manage health-related needs will improve 04/03/2021 1130 by Fredric Mare, RN Outcome: Adequate for Discharge 04/03/2021 1130 by Fredric Mare, RN Outcome: Progressing   Problem:  Clinical Measurements: Goal: Ability to maintain clinical measurements within normal limits will improve 04/03/2021 1130 by Fredric Mare, RN Outcome: Adequate for Discharge 04/03/2021 1130 by Fredric Mare, RN Outcome: Progressing Goal: Will remain free from infection 04/03/2021 1130 by Fredric Mare, RN Outcome: Adequate for Discharge 04/03/2021 1130 by Fredric Mare, RN Outcome: Progressing Goal: Diagnostic test results will improve 04/03/2021 1130 by Fredric Mare, RN Outcome: Adequate for Discharge 04/03/2021 1130 by Fredric Mare, RN Outcome: Progressing Goal: Respiratory complications will improve 04/03/2021 1130 by Fredric Mare, RN Outcome: Adequate for Discharge 04/03/2021 1130 by Fredric Mare, RN Outcome: Progressing Goal: Cardiovascular complication will be avoided 04/03/2021 1130 by Fredric Mare, RN Outcome: Adequate for Discharge 04/03/2021 1130 by Fredric Mare, RN Outcome: Progressing   Problem: Activity: Goal: Risk for activity intolerance will decrease 04/03/2021 1130 by Fredric Mare, RN Outcome: Adequate for Discharge 04/03/2021 1130 by Fredric Mare, RN Outcome: Progressing   Problem: Nutrition: Goal: Adequate nutrition will be maintained 04/03/2021 1130 by Fredric Mare, RN Outcome: Adequate for Discharge 04/03/2021 1130 by Fredric Mare, RN Outcome: Progressing   Problem: Coping: Goal: Level of anxiety will decrease 04/03/2021 1130 by Fredric Mare, RN Outcome: Adequate for Discharge 04/03/2021 1130 by Fredric Mare, RN Outcome: Progressing   Problem: Elimination: Goal: Will not experience complications related to bowel motility 04/03/2021 1130 by Fredric Mare, RN Outcome: Adequate for Discharge 04/03/2021 1130 by Fredric Mare, RN Outcome: Progressing Goal: Will not experience complications related to urinary retention 04/03/2021 1130 by Fredric Mare, RN Outcome: Adequate for Discharge 04/03/2021  1130 by Fredric Mare, RN Outcome: Progressing   Problem: Pain Managment: Goal: General experience of comfort will improve 04/03/2021 1130 by Fredric Mare, RN Outcome: Adequate for Discharge 04/03/2021 1130 by Fredric Mare, RN Outcome: Progressing   Problem:  Safety: Goal: Ability to remain free from injury will improve 04/03/2021 1130 by Fredric Mare, RN Outcome: Adequate for Discharge 04/03/2021 1130 by Fredric Mare, RN Outcome: Progressing   Problem: Skin Integrity: Goal: Risk for impaired skin integrity will decrease 04/03/2021 1130 by Fredric Mare, RN Outcome: Adequate for Discharge 04/03/2021 1130 by Fredric Mare, RN Outcome: Progressing

## 2021-04-06 ENCOUNTER — Telehealth (HOSPITAL_COMMUNITY): Payer: Self-pay

## 2021-04-06 NOTE — Telephone Encounter (Signed)
Pt insurance is active and benefits verified through [place] $10, DED 0/0 met, out of pocket $3,900/$203.84 met, co-insurance 0%. no pre-authorization required. Passport, 04/06/2021'@2' :44pm, REF# [id number]  2ndary insurance is active and benefits verified through [employer]. Co-pay 0, DED $1,500/$0 met, out of pocket $5,900/$0 met, co-insurance 30%. No pre-authorization required. Passport, 04/06/2021'@2' :49pm, REF# [id number]  Will contact patient to see if he is interested in the Cardiac Rehab Program. If interested, patient will need to complete follow up appt. Once completed, patient will be contacted for scheduling upon review by the RN Navigator.

## 2021-04-06 NOTE — Telephone Encounter (Signed)
Attempted to call patient in regards to Cardiac Rehab - LM on VM 

## 2021-04-07 LAB — CULTURE, BLOOD (ROUTINE X 2)
Culture: NO GROWTH
Culture: NO GROWTH
Special Requests: ADEQUATE

## 2021-04-09 NOTE — Progress Notes (Signed)
Cardiology Office Note:    Date:  04/16/2021   ID:  [name], DOB February 16, 1956, MRN [id number]  PCP:  [name], MD  Cardiologist:  Sanda Klein, MD   Referring MD: [name], MD   Chief Complaint  Patient presents with   Hospitalization Follow-up    MI    History of Present Illness:    [name] is a 65 y.o. male with a hx of CAD status post CABG in February 2014 (Dr. Cyndia BentScot Jun, SVG-RCA, SVG-OM1) with 2 successive inferior STEMI's involving the SVG-RCA in September 2020 PCI of anastomosis and Oct 2021 treated with PTCA of ISR thrombosis, chronic combined systolic and diastolic heart failure, ischemic cardiomyopathy, paroxysmal atrial fibrillation, history of PE, hypertension, CKD stage III, and hyperlipidemia.  He was recently admitted on 03/31/2021 with inferior STEMI.  Heart catheterization was unable to engage the LIMA to LAD but felt probably patent.  He had a patent SVG-RCA with patent stent and previous anastomotic PTCA.  Culprit lesion felt to be extensively thrombotic occlusion of the SVG-OM that was not felt to be salvageable without potentially risking distal embolization into the native vessel.  He was treated with Aggrastat and then IV heparin.  Initial plan was to do a relook cath on Monday, but deferred due to likelihood of success on intervention of the vein graft felt low.  He was treated with triple therapy including aspirin, Plavix, and Eliquis.  Aspirin will be discontinued in 1 month.  Continued on statin and beta-blocker.  Echocardiogram that admission with an LVEF of 20 to 25% with new wall motion abnormality and grade 2 diastolic dysfunction.  This is a slight reduction from echo in February 2022 with an LVEF of 25 to 30%.  Home hydralazine was discontinued.  He was continued on Toprol, spironolactone, Entresto, and farxiga.  He remains on 80 mg of Lipitor and 10 mg Zetia.  Amiodarone continued for PAF.  He presents today for follow-up. He  is here with his wife. He is doing well but admits he needs to increase physical activity. We discussed a walking program. He is  not interested in cardiac rehab. He is euvolemic on exam and reports feeling much better with the addition of farxiga. We discussed daily weights and 1800 mg sodium restriction. No CP, SOB, orthopnea, or lower extremity swelling. He is on triple therapy and will stop ASA on 05/01/21.  Cath site C/D/I but with yeast.    Past Medical History:  Diagnosis Date   Borderline diabetic    Coronary artery disease    a. s/p CABG in 2014 with LIMA-LAD, SVG-OM2 and SVG-RC b. 03/2019: STEMI and s/p angioplasty to proximal SVG-RCA and DES to SVG-RCA anastomosis c. 04/2020: recurrent STEMI with angioplasty of SVG-RCA   Dyslipidemia 01/11/2013   Hiatal hernia    Hypertension    Mini stroke    2   S/P CABG x 3    STEMI (ST elevation myocardial infarction) ([place])    Stroke [facility])     Past Surgical History:  Procedure Laterality Date   CARDIAC CATHETERIZATION     CORONARY ARTERY BYPASS GRAFT N/A 09/01/2012   Procedure: CORONARY ARTERY BYPASS GRAFTING (CABG);  Surgeon: Gaye Pollack, MD;  Location: [place];  Service: Open Heart Surgery;  Laterality: N/A;  Coronary Artery Bypass Grafting Times Three Using Left Internal Mammary Artery and Left Saphenous leg Vein Harvested Endoscopically   CORONARY STENT INTERVENTION N/A 03/23/2019   Procedure: CORONARY STENT INTERVENTION;  Surgeon:  Lorretta Harp, MD;  Location: [place] CV LAB;  Service: Cardiovascular;  Laterality: N/A;   CORONARY/GRAFT ACUTE MI REVASCULARIZATION N/A 03/23/2019   Procedure: Coronary/Graft Acute MI Revascularization;  Surgeon: Lorretta Harp, MD;  Location: [place] CV LAB;  Service: Cardiovascular;  Laterality: N/A;   CORONARY/GRAFT ACUTE MI REVASCULARIZATION N/A 05/03/2020   Procedure: Coronary/Graft Acute MI Revascularization;  Surgeon: Belva Crome, MD;  Location: [place] CV LAB;  Service:  Cardiovascular;  Laterality: N/A;   IABP INSERTION N/A 06/04/2020   Procedure: IABP Insertion;  Surgeon: Martinique, Peter M, MD;  Location: [place] CV LAB;  Service: Cardiovascular;  Laterality: N/A;   ICD IMPLANT N/A 06/19/2020   Procedure: ICD IMPLANT;  Surgeon: Vickie Epley, MD;  Location: [place] CV LAB;  Service: Cardiovascular;  Laterality: N/A;   INTRAOPERATIVE TRANSESOPHAGEAL ECHOCARDIOGRAM N/A 09/01/2012   Procedure: INTRAOPERATIVE TRANSESOPHAGEAL ECHOCARDIOGRAM;  Surgeon: Gaye Pollack, MD;  Location: [place] OR;  Service: Open Heart Surgery;  Laterality: N/A;   LEFT HEART CATH AND CORONARY ANGIOGRAPHY N/A 03/23/2019   Procedure: LEFT HEART CATH AND CORONARY ANGIOGRAPHY;  Surgeon: Lorretta Harp, MD;  Location: [place] CV LAB;  Service: Cardiovascular;  Laterality: N/A;   LEFT HEART CATH AND CORONARY ANGIOGRAPHY N/A 05/03/2020   Procedure: LEFT HEART CATH AND CORONARY ANGIOGRAPHY;  Surgeon: Belva Crome, MD;  Location: [place] CV LAB;  Service: Cardiovascular;  Laterality: N/A;   LEFT HEART CATH AND CORS/GRAFTS ANGIOGRAPHY N/A 03/31/2021   Procedure: LEFT HEART CATH AND CORS/GRAFTS ANGIOGRAPHY;  Surgeon: [name] Man, MD;  Location: [place] CV LAB;  Service: Cardiovascular;  Laterality: N/A;   LEFT HEART CATHETERIZATION WITH CORONARY ANGIOGRAM N/A 08/31/2012   Procedure: LEFT HEART CATHETERIZATION WITH CORONARY ANGIOGRAM;  Surgeon: Peter M Martinique, MD;  Location: [facility] CATH LAB;  Service: Cardiovascular;  Laterality: N/A;   PERCUTANEOUS CORONARY STENT INTERVENTION (PCI-S) N/A 08/31/2012   Procedure: PERCUTANEOUS CORONARY STENT INTERVENTION (PCI-S);  Surgeon: Peter M Martinique, MD;  Location: [facility] CATH LAB;  Service: Cardiovascular;  Laterality: N/A;   RIGHT/LEFT HEART CATH AND CORONARY/GRAFT ANGIOGRAPHY N/A 06/04/2020   Procedure: RIGHT/LEFT HEART CATH AND CORONARY/GRAFT ANGIOGRAPHY;  Surgeon: Martinique, Peter M, MD;  Location: [place] CV LAB;  Service: Cardiovascular;   Laterality: N/A;   TEMPORARY PACEMAKER N/A 06/04/2020   Procedure: TEMPORARY PACEMAKER;  Surgeon: Martinique, Peter M, MD;  Location: [place] CV LAB;  Service: Cardiovascular;  Laterality: N/A;    Current Medications: Current Meds  Medication Sig   nystatin (MYCOSTATIN/NYSTOP) powder APPLY TOPICALLY TO THE AFFECTED AREA 2 TIMES A DAY FOR 2 WEEKS     Allergies:   Patient has no known allergies.   Social History   Socioeconomic History   Marital status: Married    Spouse name: Not on file   Number of children: Not on file   Years of education: Not on file   Highest education level: Not on file  Occupational History   Not on file  Tobacco Use   Smoking status: Former    Types: Pipe   Smokeless tobacco: Former    Quit date: 07/15/1968  Vaping Use   Vaping Use: Never used  Substance and Sexual Activity   Alcohol use: No   Drug use: No   Sexual activity: Not Currently  Other Topics Concern   Not on file  Social History Narrative   Not on file   Social Determinants of Health   Financial Resource Strain: Not on file  Food Insecurity: Not on file  Transportation Needs: Not on file  Physical Activity: Not on file  Stress: Not on file  Social Connections: Not on file     Family History: The patient's family history includes Cancer in his father; Heart attack in his brother, father, and sister; Hyperlipidemia in his brother and father; Hypertension in his brother, father, and sister.  ROS:   Please see the history of present illness.     All other systems reviewed and are negative.  EKGs/Labs/Other Studies Reviewed:    The following studies were reviewed today:  Left heart cath 03/31/21:   Mid LAD lesion is 100% stenosed.   Ost Cx to Prox Cx lesion is 100% stenosed.   Prox RCA to Dist RCA lesion is 100% stenosed.   Dist RCA lesion is 20% stenosed.   Origin to Prox Graft lesion is 30% stenosed.   RPDA lesion is 30% stenosed.   Prox Graft to Insertion lesion is 100%  stenosed.   LIMA graft was not visualized due to inability to cannulate.   LV end diastolic pressure is severely elevated.   There is no aortic valve stenosis.   SUMMARY Severe multivessel native CAD: Known CTO of mid LAD, proximal RCA and proximal LCx Culprit lesion is extensively thrombotic occlusion of the SVG-OM1 with dye staining after 1 injection throughout the entire case. Assumed patent LIMA-LAD (unable to engage) Patent SVG-RCA with patent stent and previous anastomosis PTCA Known ejection fraction of 25 to 30% (no LV gram performed to conserve contrast), -> LVEDP of 25 mmHg suggests ACUTE ON CHRONIC DIASTOLIC HEART FAILURE in setting of known CHRONIC COMBINED SYSTOLIC AND DIASTOLIC HEART FAILURE   PLAN: Based on the severity of the SVG occlusion with extensive thrombus/Promus noted and no flow reaching the native vessel, I assessed the patient symptoms.  He was not having chest pain.  I determined that attempted intervention on this vessel at this time would likely lead to distal embolization and occlusion of the native LCx. We will run Aggrastat x12 hours and continue home dose of Plavix Restart IV heparin 6 hours after sheath removal (hold Eliquis) Consider relook catheterization on Monday, 04/02/2021 to determine if there is any flow down the SVG or potential PCI of the native LCx which does not appear to be viable.   Would also evaluate LIMA-LAD via the left radial access. With elevated EDP and known reduced EF, check 2D echocardiogram and give at least 1 dose IV Lasix today.  Reassess in the morning. Gentle post-cath hydration follow renal function. Continue home medications including Entresto, Toprol, amiodarone   Echo 04/01/21: 1. EF unchanged 20-25%. Severe hypokinesis/akinesis of the entire lateral  wall. EF appears similar to prior. LVID is now severely dilated. WMA  appear to be new.   2. Left ventricular ejection fraction, by estimation, is 20 to 25%. The  left  ventricle has severely decreased function. The left ventricle  demonstrates regional wall motion abnormalities (see scoring  diagram/findings for description). The left  ventricular internal cavity size was severely dilated. Left ventricular  diastolic parameters are consistent with Grade II diastolic dysfunction  (pseudonormalization).   3. Right ventricular systolic function is moderately reduced. The right  ventricular size is normal. Tricuspid regurgitation signal is inadequate  for assessing PA pressure.   4. Left atrial size was moderately dilated.   5. The mitral valve is grossly normal. Moderate mitral valve  regurgitation. No evidence of mitral stenosis.   6. The aortic valve is  tricuspid. Aortic valve regurgitation is not  visualized. No aortic stenosis is present.   7. The inferior vena cava is normal in size with greater than 50%  respiratory variability, suggesting right atrial pressure of 3 mmHg.    EKG:  EKG is  ordered today.  The ekg ordered today demonstrates sinus bradycardia HR 55, inferior infarct  - stable from prior  Recent Labs: 05/04/2020: B Natriuretic Peptide 1,408.4 07/17/2020: Magnesium 2.1 02/06/2021: TSH 2.380 04/02/2021: ALT 34 04/03/2021: BUN 19; Creatinine, Ser 1.45; Hemoglobin 14.8; Platelets 228; Potassium 3.7; Sodium 134  Recent Lipid Panel    Component Value Date/Time   CHOL 168 03/31/2021 0752   CHOL 126 10/12/2019 0835   TRIG 385 (H) 03/31/2021 0752   HDL 30 (L) 03/31/2021 0752   HDL 29 (L) 10/12/2019 0835   CHOLHDL 5.6 03/31/2021 0752   VLDL 77 (H) 03/31/2021 0752   LDLCALC 61 03/31/2021 0752   LDLCALC 58 10/12/2019 0835   LDLDIRECT 94.8 03/23/2019 2046    Physical Exam:    VS:  BP 118/74   Pulse (!) 55   Ht '5\' 7"'$  (1.702 m)   Wt 207 lb 3.2 oz (94 kg)   SpO2 98%   BMI 32.45 kg/m     Wt Readings from Last 3 Encounters:  04/16/21 207 lb 3.2 oz (94 kg)  04/02/21 200 lb 9.9 oz (91 kg)  12/21/20 202 lb (91.6 kg)     GEN:  Well  nourished, well developed in no acute distress HEENT: Normal NECK: No JVD; No carotid bruits LYMPHATICS: No lymphadenopathy CARDIAC: regular rhythm, bradycardic rate, no murmurs, rubs, gallops RESPIRATORY:  Clear to auscultation without rales, wheezing or rhonchi  ABDOMEN: Soft, non-tender, non-distended MUSCULOSKELETAL:  No edema; No deformity  SKIN: Warm and dry NEUROLOGIC:  Alert and oriented x 3 PSYCHIATRIC:  Normal affect  Groin site C/D/I but with yeast  ASSESSMENT:    1. Coronary artery disease involving coronary bypass graft of native heart without angina pectoris   2. Stage 3 chronic kidney disease, unspecified whether stage 3a or 3b CKD ([place])   3. Hyperlipidemia, unspecified hyperlipidemia type   4. Chronic combined systolic and diastolic heart failure ([place])   5. Ischemic cardiomyopathy   6. Essential hypertension   7. Paroxysmal atrial fibrillation (HCC)   8. Long term (current) use of anticoagulants   9. Leukocytosis, unspecified type    PLAN:    In order of problems listed above:  CAD status post CABG Hyperlipidemia with LDL goal < 70 - continue triple therapy ASA, plavix, and eliquis - stop ASA after 30 days - 05/01/21 03/31/2021: Cholesterol 168; HDL 30; LDL Cholesterol 61; Triglycerides 385; VLDL 77 - will repeat lipids at follow up in 3 weeks given new guidelines - yeast at cath site, will apply nystatin powder twice daily and use zinc during the day   Chronic systolic and diastolic heart failure Ischemic cardiomyopathy Hypertension - LVEF 20-25%, grade 2 DD - continue GDMT including BB, spiro, entresto, and farxiga - no room to titrate medications - euvolemic on exam - discussed 1800 mg sodium restriction and daily weights   PAF Chronic anticoagulation - continue BB, amiodarone, and eliquis - in sinus rhythm today   CKD stage III - check BMP - baseline 1.3-1.5 - discharge sCr 1.45   Leukocytosis - will recheck CBC - blood cultures  negative - no pain or recent illness   Follow up with me or Dr. Sallyanne Kuster in 3 months.    Medication  Adjustments/Labs and Tests Ordered: Current medicines are reviewed at length with the patient today.  Concerns regarding medicines are outlined above.  Orders Placed This Encounter  Procedures   Basic metabolic panel   CBC   Lipid panel   EKG 12-Lead    Meds ordered this encounter  Medications   ezetimibe (ZETIA) 10 MG tablet    Sig: Take 1 tablet (10 mg total) by mouth daily.    Dispense:  90 tablet    Refill:  3   clopidogrel (PLAVIX) 75 MG tablet    Sig: Take 1 tablet (75 mg total) by mouth daily.    Dispense:  90 tablet    Refill:  3   nystatin (MYCOSTATIN/NYSTOP) powder    Sig: APPLY TOPICALLY TO THE AFFECTED AREA 2 TIMES A DAY FOR 2 WEEKS    Dispense:  30 g    Refill:  0     Signed, Ledora Bottcher, [place]  04/16/2021 9:47 AM    [facility]

## 2021-04-16 ENCOUNTER — Encounter: Payer: Self-pay | Admitting: Physician Assistant

## 2021-04-16 ENCOUNTER — Other Ambulatory Visit: Payer: Self-pay

## 2021-04-16 ENCOUNTER — Ambulatory Visit (INDEPENDENT_AMBULATORY_CARE_PROVIDER_SITE_OTHER): Payer: Medicare HMO | Admitting: Physician Assistant

## 2021-04-16 VITALS — BP 118/74 | HR 55 | Ht 67.0 in | Wt 207.2 lb

## 2021-04-16 DIAGNOSIS — I255 Ischemic cardiomyopathy: Secondary | ICD-10-CM | POA: Diagnosis not present

## 2021-04-16 DIAGNOSIS — I10 Essential (primary) hypertension: Secondary | ICD-10-CM

## 2021-04-16 DIAGNOSIS — N183 Chronic kidney disease, stage 3 unspecified: Secondary | ICD-10-CM | POA: Diagnosis not present

## 2021-04-16 DIAGNOSIS — D72829 Elevated white blood cell count, unspecified: Secondary | ICD-10-CM

## 2021-04-16 DIAGNOSIS — I480 Paroxysmal atrial fibrillation: Secondary | ICD-10-CM

## 2021-04-16 DIAGNOSIS — Z7901 Long term (current) use of anticoagulants: Secondary | ICD-10-CM | POA: Diagnosis not present

## 2021-04-16 DIAGNOSIS — E785 Hyperlipidemia, unspecified: Secondary | ICD-10-CM | POA: Diagnosis not present

## 2021-04-16 DIAGNOSIS — I25810 Atherosclerosis of coronary artery bypass graft(s) without angina pectoris: Secondary | ICD-10-CM

## 2021-04-16 DIAGNOSIS — I5042 Chronic combined systolic (congestive) and diastolic (congestive) heart failure: Secondary | ICD-10-CM

## 2021-04-16 LAB — CBC
Hematocrit: 46.2 % (ref 37.5–51.0)
Hemoglobin: 15.5 g/dL (ref 13.0–17.7)
MCH: 31.5 pg (ref 26.6–33.0)
MCHC: 33.5 g/dL (ref 31.5–35.7)
MCV: 94 fL (ref 79–97)
Platelets: 386 10*3/uL (ref 150–450)
RBC: 4.92 x10E6/uL (ref 4.14–5.80)
RDW: 13.1 % (ref 11.6–15.4)
WBC: 12.4 10*3/uL — ABNORMAL HIGH (ref 3.4–10.8)

## 2021-04-16 LAB — BASIC METABOLIC PANEL
BUN/Creatinine Ratio: 11 (ref 10–24)
BUN: 16 mg/dL (ref 8–27)
CO2: 21 mmol/L (ref 20–29)
Calcium: 9.2 mg/dL (ref 8.6–10.2)
Chloride: 102 mmol/L (ref 96–106)
Creatinine, Ser: 1.49 mg/dL — ABNORMAL HIGH (ref 0.76–1.27)
Glucose: 79 mg/dL (ref 70–99)
Potassium: 5.2 mmol/L (ref 3.5–5.2)
Sodium: 138 mmol/L (ref 134–144)
eGFR: 51 mL/min/{1.73_m2} — ABNORMAL LOW (ref >59)

## 2021-04-16 MED ORDER — EZETIMIBE 10 MG PO TABS: 10.0000 mg | ORAL_TABLET | Freq: Every day | ORAL | 3 refills | Status: DC

## 2021-04-16 MED ORDER — NYSTATIN 100000 UNIT/GM EX POWD: CUTANEOUS | 0 refills | Status: AC

## 2021-04-16 MED ORDER — CLOPIDOGREL BISULFATE 75 MG PO TABS: 75.0000 mg | ORAL_TABLET | Freq: Every day | ORAL | 3 refills | Status: DC

## 2021-04-16 NOTE — Patient Instructions (Addendum)
Medication Instructions:  STOP Asprin on 04/30/21  *If you need a refill on your cardiac medications before your next appointment, please call your pharmacy*  Lab Work: Your physician recommends that you return for lab work TODAY:  BMET CBC  Your physician recommends that you return for lab work in [place] at follow up appointment:  Fasting Lipid Panel-DO NOT EAT OR DRINK PAST MIDNIGHT. OKAY TO HAVE WATER.  If you have labs (blood work) drawn today and your tests are completely normal, you will receive your results only by: [place] (if you have MyChart) OR A paper copy in the mail If you have any lab test that is abnormal or we need to change your treatment, we will call you to review the results.  Testing/Procedures: NONE ordered at this time of appointment   Follow-Up: At [facility], you and your health needs are our priority.  As part of our continuing mission to provide you with exceptional heart care, we have created designated Provider Care Teams.  These Care Teams include your primary Cardiologist (physician) and Advanced Practice Providers (APPs -  Physician Assistants and Nurse Practitioners) who all work together to provide you with the care you need, when you need it.  We recommend signing up for the patient portal called "MyChart".  Sign up information is provided on this After Visit Summary.  MyChart is used to connect with patients for Virtual Visits (Telemedicine).  Patients are able to view lab/test results, encounter notes, upcoming appointments, etc.  Non-urgent messages can be sent to your provider as well.   To learn more about what you can do with MyChart, go to [email or web address].    Your next appointment:   3 month(s)  The format for your next appointment:   In Person  Provider:   Sanda Klein, MD or APP  Other Instructions Weigh self daily. If weight gain of 3 lbs over night please give our office a call. Sodium intake limited to 1800  mg daily  Use Desitin cream to the affected area as needed   Low-Sodium Eating Plan Sodium, which is an element that makes up salt, helps you maintain a healthy balance of fluids in your body. Too much sodium can increase your blood pressure and cause fluid and waste to be held in your body. Your health care provider or dietitian may recommend following this plan if you have high blood pressure (hypertension), kidney disease, liver disease, or heart failure. Eating less sodium can help lower your blood pressure, reduce swelling, and protect your heart, liver, and kidneys. What are tips for following this plan? Reading food labels The Nutrition Facts label lists the amount of sodium in one serving of the food. If you eat more than one serving, you must multiply the listed amount of sodium by the number of servings. Choose foods with less than 140 mg of sodium per serving. Avoid foods with 300 mg of sodium or more per serving. Shopping  Look for lower-sodium products, often labeled as "low-sodium" or "no salt added." Always check the sodium content, even if foods are labeled as "unsalted" or "no salt added." Buy fresh foods. Avoid canned foods and pre-made or frozen meals. Avoid canned, cured, or processed meats. Buy breads that have less than 80 mg of sodium per slice. Cooking  Eat more home-cooked food and less restaurant, buffet, and fast food. Avoid adding salt when cooking. Use salt-free seasonings or herbs instead of table salt or sea salt. Check with your health  care provider or pharmacist before using salt substitutes. Cook with plant-based oils, such as canola, sunflower, or olive oil. Meal planning When eating at a restaurant, ask that your food be prepared with less salt or no salt, if possible. Avoid dishes labeled as brined, pickled, cured, smoked, or made with soy sauce, miso, or teriyaki sauce. Avoid foods that contain MSG (monosodium glutamate). MSG is sometimes added to  [place] food, bouillon, and some canned foods. Make meals that can be grilled, baked, poached, roasted, or steamed. These are generally made with less sodium. General information Most people on this plan should limit their sodium intake to 1,500-2,000 mg (milligrams) of sodium each day. What foods should I eat? Fruits Fresh, frozen, or canned fruit. Fruit juice. Vegetables Fresh or frozen vegetables. "No salt added" canned vegetables. "No salt added" tomato sauce and paste. Low-sodium or reduced-sodium tomato and vegetable juice. Grains Low-sodium cereals, including oats, puffed wheat and rice, and shredded wheat. Low-sodium crackers. Unsalted rice. Unsalted pasta. Low-sodium bread. Whole-grain breads and whole-grain pasta. Meats and other proteins Fresh or frozen (no salt added) meat, poultry, seafood, and fish. Low-sodium canned tuna and salmon. Unsalted nuts. Dried peas, beans, and lentils without added salt. Unsalted canned beans. Eggs. Unsalted nut butters. Dairy Milk. Soy milk. Cheese that is naturally low in sodium, such as ricotta cheese, fresh mozzarella, or Swiss cheese. Low-sodium or reduced-sodium cheese. Cream cheese. Yogurt. Seasonings and condiments Fresh and dried herbs and spices. Salt-free seasonings. Low-sodium mustard and ketchup. Sodium-free salad dressing. Sodium-free light mayonnaise. Fresh or refrigerated horseradish. Lemon juice. Vinegar. Other foods Homemade, reduced-sodium, or low-sodium soups. Unsalted popcorn and pretzels. Low-salt or salt-free chips. The items listed above may not be a complete list of foods and beverages you can eat. Contact a dietitian for more information. What foods should I avoid? Vegetables Sauerkraut, pickled vegetables, and relishes. Olives. [place] fries. Onion rings. Regular canned vegetables (not low-sodium or reduced-sodium). Regular canned tomato sauce and paste (not low-sodium or reduced-sodium). Regular tomato and vegetable juice (not  low-sodium or reduced-sodium). Frozen vegetables in sauces. Grains Instant hot cereals. Bread stuffing, pancake, and biscuit mixes. Croutons. Seasoned rice or pasta mixes. Noodle soup cups. Boxed or frozen macaroni and cheese. Regular salted crackers. Self-rising flour. Meats and other proteins Meat or fish that is salted, canned, smoked, spiced, or pickled. Precooked or cured meat, such as sausages or meat loaves. Berniece Salines. Ham. Pepperoni. Hot dogs. Corned beef. Chipped beef. Salt pork. Jerky. Pickled herring. Anchovies and sardines. Regular canned tuna. Salted nuts. Dairy Processed cheese and cheese spreads. Hard cheeses. Cheese curds. Blue cheese. Feta cheese. String cheese. Regular cottage cheese. Buttermilk. Canned milk. Fats and oils Salted butter. Regular margarine. Ghee. Bacon fat. Seasonings and condiments Onion salt, garlic salt, seasoned salt, table salt, and sea salt. Canned and packaged gravies. Worcestershire sauce. Tartar sauce. Barbecue sauce. Teriyaki sauce. Soy sauce, including reduced-sodium. Steak sauce. Fish sauce. Oyster sauce. Cocktail sauce. Horseradish that you find on the shelf. Regular ketchup and mustard. Meat flavorings and tenderizers. Bouillon cubes. Hot sauce. Pre-made or packaged marinades. Pre-made or packaged taco seasonings. Relishes. Regular salad dressings. Salsa. Other foods Salted popcorn and pretzels. Corn chips and puffs. Potato and tortilla chips. Canned or dried soups. Pizza. Frozen entrees and pot pies. The items listed above may not be a complete list of foods and beverages you should avoid. Contact a dietitian for more information. Summary Eating less sodium can help lower your blood pressure, reduce swelling, and protect your heart, liver, and kidneys.  Most people on this plan should limit their sodium intake to 1,500-2,000 mg (milligrams) of sodium each day. Canned, boxed, and frozen foods are high in sodium. Restaurant foods, fast foods, and pizza are  also very high in sodium. You also get sodium by adding salt to food. Try to cook at home, eat more fresh fruits and vegetables, and eat less fast food and canned, processed, or prepared foods. This information is not intended to replace advice given to you by your health care provider. Make sure you discuss any questions you have with your health care provider. Document Revised: 08/06/2019 Document Reviewed: 06/02/2019 Elsevier Patient Education  2022 [employer].

## 2021-04-19 ENCOUNTER — Telehealth: Payer: Self-pay | Admitting: *Deleted

## 2021-04-19 ENCOUNTER — Other Ambulatory Visit: Payer: Self-pay | Admitting: Cardiovascular Disease

## 2021-04-19 NOTE — Telephone Encounter (Signed)
Notification received that the patient has approved for the [employer] foundation assistance for Entresto, Metoprolol and Farxiga

## 2021-04-20 ENCOUNTER — Encounter (HOSPITAL_COMMUNITY): Payer: Medicare HMO | Admitting: Internal Medicine

## 2021-04-25 ENCOUNTER — Encounter: Payer: Medicare HMO | Admitting: Cardiology

## 2021-04-26 ENCOUNTER — Encounter: Payer: Self-pay | Admitting: *Deleted

## 2021-07-20 ENCOUNTER — Ambulatory Visit: Payer: Medicare HMO | Admitting: Adult Health

## 2021-08-06 ENCOUNTER — Other Ambulatory Visit: Payer: Self-pay | Admitting: Cardiovascular Disease

## 2021-08-09 ENCOUNTER — Encounter (HOSPITAL_COMMUNITY): Payer: Self-pay

## 2021-08-09 ENCOUNTER — Other Ambulatory Visit: Payer: Self-pay

## 2021-08-09 ENCOUNTER — Emergency Department (HOSPITAL_COMMUNITY): Payer: Medicare PPO

## 2021-08-09 ENCOUNTER — Inpatient Hospital Stay (HOSPITAL_COMMUNITY)
Admission: EM | Admit: 2021-08-09 | Discharge: 2021-08-21 | DRG: 291 | Disposition: A | Discharge status: Home Health | Payer: Medicare PPO | Attending: Internal Medicine | Admitting: Internal Medicine

## 2021-08-09 DIAGNOSIS — Z87891 Personal history of nicotine dependence: Secondary | ICD-10-CM

## 2021-08-09 DIAGNOSIS — I493 Ventricular premature depolarization: Secondary | ICD-10-CM | POA: Diagnosis present

## 2021-08-09 DIAGNOSIS — Z955 Presence of coronary angioplasty implant and graft: Secondary | ICD-10-CM

## 2021-08-09 DIAGNOSIS — Z8673 Personal history of transient ischemic attack (TIA), and cerebral infarction without residual deficits: Secondary | ICD-10-CM

## 2021-08-09 DIAGNOSIS — I714 Abdominal aortic aneurysm, without rupture, unspecified: Secondary | ICD-10-CM | POA: Diagnosis present

## 2021-08-09 DIAGNOSIS — N179 Acute kidney failure, unspecified: Secondary | ICD-10-CM | POA: Diagnosis present

## 2021-08-09 DIAGNOSIS — E871 Hypo-osmolality and hyponatremia: Secondary | ICD-10-CM | POA: Diagnosis present

## 2021-08-09 DIAGNOSIS — E876 Hypokalemia: Secondary | ICD-10-CM | POA: Diagnosis present

## 2021-08-09 DIAGNOSIS — Z20822 Contact with and (suspected) exposure to covid-19: Secondary | ICD-10-CM | POA: Diagnosis present

## 2021-08-09 DIAGNOSIS — Z8679 Personal history of other diseases of the circulatory system: Secondary | ICD-10-CM

## 2021-08-09 DIAGNOSIS — Z8249 Family history of ischemic heart disease and other diseases of the circulatory system: Secondary | ICD-10-CM

## 2021-08-09 DIAGNOSIS — I2510 Atherosclerotic heart disease of native coronary artery without angina pectoris: Secondary | ICD-10-CM | POA: Diagnosis present

## 2021-08-09 DIAGNOSIS — R609 Edema, unspecified: Secondary | ICD-10-CM

## 2021-08-09 DIAGNOSIS — Z7902 Long term (current) use of antithrombotics/antiplatelets: Secondary | ICD-10-CM

## 2021-08-09 DIAGNOSIS — I480 Paroxysmal atrial fibrillation: Secondary | ICD-10-CM | POA: Diagnosis present

## 2021-08-09 DIAGNOSIS — I509 Heart failure, unspecified: Secondary | ICD-10-CM

## 2021-08-09 DIAGNOSIS — Z95810 Presence of automatic (implantable) cardiac defibrillator: Secondary | ICD-10-CM

## 2021-08-09 DIAGNOSIS — I25810 Atherosclerosis of coronary artery bypass graft(s) without angina pectoris: Secondary | ICD-10-CM | POA: Diagnosis present

## 2021-08-09 DIAGNOSIS — Z9114 Patient's other noncompliance with medication regimen: Secondary | ICD-10-CM

## 2021-08-09 DIAGNOSIS — Z86711 Personal history of pulmonary embolism: Secondary | ICD-10-CM | POA: Diagnosis present

## 2021-08-09 DIAGNOSIS — I69351 Hemiplegia and hemiparesis following cerebral infarction affecting right dominant side: Secondary | ICD-10-CM

## 2021-08-09 DIAGNOSIS — I130 Hypertensive heart and chronic kidney disease with heart failure and stage 1 through stage 4 chronic kidney disease, or unspecified chronic kidney disease: Principal | ICD-10-CM | POA: Diagnosis present

## 2021-08-09 DIAGNOSIS — Z8674 Personal history of sudden cardiac arrest: Secondary | ICD-10-CM

## 2021-08-09 DIAGNOSIS — N1831 Chronic kidney disease, stage 3a: Secondary | ICD-10-CM

## 2021-08-09 DIAGNOSIS — Z7901 Long term (current) use of anticoagulants: Secondary | ICD-10-CM

## 2021-08-09 DIAGNOSIS — I5082 Biventricular heart failure: Secondary | ICD-10-CM | POA: Diagnosis present

## 2021-08-09 DIAGNOSIS — I959 Hypotension, unspecified: Secondary | ICD-10-CM | POA: Diagnosis present

## 2021-08-09 DIAGNOSIS — R7401 Elevation of levels of liver transaminase levels: Secondary | ICD-10-CM | POA: Diagnosis present

## 2021-08-09 DIAGNOSIS — I255 Ischemic cardiomyopathy: Secondary | ICD-10-CM

## 2021-08-09 DIAGNOSIS — I5023 Acute on chronic systolic (congestive) heart failure: Secondary | ICD-10-CM | POA: Diagnosis present

## 2021-08-09 DIAGNOSIS — Z7189 Other specified counseling: Secondary | ICD-10-CM

## 2021-08-09 DIAGNOSIS — Z83438 Family history of other disorder of lipoprotein metabolism and other lipidemia: Secondary | ICD-10-CM

## 2021-08-09 DIAGNOSIS — Z79899 Other long term (current) drug therapy: Secondary | ICD-10-CM

## 2021-08-09 DIAGNOSIS — E785 Hyperlipidemia, unspecified: Secondary | ICD-10-CM | POA: Diagnosis present

## 2021-08-09 DIAGNOSIS — I10 Essential (primary) hypertension: Secondary | ICD-10-CM | POA: Diagnosis not present

## 2021-08-09 DIAGNOSIS — I252 Old myocardial infarction: Secondary | ICD-10-CM

## 2021-08-09 DIAGNOSIS — I5084 End stage heart failure: Secondary | ICD-10-CM | POA: Diagnosis present

## 2021-08-09 DIAGNOSIS — N1832 Chronic kidney disease, stage 3b: Secondary | ICD-10-CM | POA: Diagnosis present

## 2021-08-09 DIAGNOSIS — I712 Thoracic aortic aneurysm, without rupture, unspecified: Secondary | ICD-10-CM | POA: Diagnosis present

## 2021-08-09 HISTORY — DX: Sleep disorder, unspecified: G47.9

## 2021-08-09 HISTORY — DX: Cardiogenic shock: R57.0

## 2021-08-09 HISTORY — DX: Other pulmonary embolism without acute cor pulmonale: I26.99

## 2021-08-09 HISTORY — DX: Respiratory failure, unspecified with hypoxia: J96.91

## 2021-08-09 HISTORY — DX: Elevation of levels of liver transaminase levels: R74.01

## 2021-08-09 LAB — BASIC METABOLIC PANEL
Anion gap: 12 (ref 5–15)
BUN: 47 mg/dL — ABNORMAL HIGH (ref 8–23)
CO2: 20 mmol/L — ABNORMAL LOW (ref 22–32)
Calcium: 9 mg/dL (ref 8.9–10.3)
Chloride: 102 mmol/L (ref 98–111)
Creatinine, Ser: 1.87 mg/dL — ABNORMAL HIGH (ref 0.61–1.24)
GFR, Estimated: 39 mL/min — ABNORMAL LOW (ref >60)
Glucose, Bld: 111 mg/dL — ABNORMAL HIGH (ref 70–99)
Potassium: 4.9 mmol/L (ref 3.5–5.1)
Sodium: 134 mmol/L — ABNORMAL LOW (ref 135–145)

## 2021-08-09 LAB — CBC
HCT: 41.6 % (ref 39.0–52.0)
Hemoglobin: 12.9 g/dL — ABNORMAL LOW (ref 13.0–17.0)
MCH: 25.1 pg — ABNORMAL LOW (ref 26.0–34.0)
MCHC: 31 g/dL (ref 30.0–36.0)
MCV: 81.1 fL (ref 80.0–100.0)
Platelets: 282 10*3/uL (ref 150–400)
RBC: 5.13 MIL/uL (ref 4.22–5.81)
RDW: 17.2 % — ABNORMAL HIGH (ref 11.5–15.5)
WBC: 10.5 10*3/uL (ref 4.0–10.5)
nRBC: 0 % (ref 0.0–0.2)

## 2021-08-09 LAB — TROPONIN I (HIGH SENSITIVITY)
Troponin I (High Sensitivity): 31 ng/L — ABNORMAL HIGH (ref <18)
Troponin I (High Sensitivity): 32 ng/L — ABNORMAL HIGH (ref <18)

## 2021-08-09 MED ORDER — AMIODARONE HCL 200 MG PO TABS
200.0000 mg | ORAL_TABLET | Freq: Two times a day (BID) | ORAL | Status: DC
  Administered 2021-08-10 – 2021-08-14 (×11): 200 mg via ORAL
  Filled 2021-08-09 (×12): qty 1

## 2021-08-09 MED ORDER — NYSTATIN 100000 UNIT/GM EX POWD
1.0000 "application " | Freq: Two times a day (BID) | CUTANEOUS | Status: DC | PRN
  Filled 2021-08-09: qty 15

## 2021-08-09 MED ORDER — ACETAMINOPHEN 325 MG PO TABS: 650.0000 mg | ORAL_TABLET | Freq: Four times a day (QID) | ORAL | Status: DC | PRN

## 2021-08-09 MED ORDER — SODIUM CHLORIDE 0.9% FLUSH
3.0000 mL | Freq: Two times a day (BID) | INTRAVENOUS | Status: DC
  Administered 2021-08-09 – 2021-08-14 (×9): 3 mL via INTRAVENOUS

## 2021-08-09 MED ORDER — ESCITALOPRAM OXALATE 10 MG PO TABS
10.0000 mg | ORAL_TABLET | Freq: Every day | ORAL | Status: DC
  Administered 2021-08-10 – 2021-08-21 (×12): 10 mg via ORAL
  Filled 2021-08-09 (×12): qty 1

## 2021-08-09 MED ORDER — CLOPIDOGREL BISULFATE 75 MG PO TABS
75.0000 mg | ORAL_TABLET | Freq: Every day | ORAL | Status: DC
  Administered 2021-08-10 – 2021-08-21 (×12): 75 mg via ORAL
  Filled 2021-08-09 (×12): qty 1

## 2021-08-09 MED ORDER — ACETAMINOPHEN 650 MG RE SUPP: 650.0000 mg | Freq: Four times a day (QID) | RECTAL | Status: DC | PRN

## 2021-08-09 MED ORDER — DAPAGLIFLOZIN PROPANEDIOL 10 MG PO TABS
10.0000 mg | ORAL_TABLET | Freq: Every day | ORAL | Status: DC
  Administered 2021-08-10 – 2021-08-13 (×4): 10 mg via ORAL
  Filled 2021-08-09 (×5): qty 1

## 2021-08-09 MED ORDER — ATORVASTATIN CALCIUM 80 MG PO TABS
80.0000 mg | ORAL_TABLET | Freq: Every day | ORAL | Status: DC
  Administered 2021-08-10 – 2021-08-21 (×12): 80 mg via ORAL
  Filled 2021-08-09 (×12): qty 1

## 2021-08-09 MED ORDER — SPIRONOLACTONE 12.5 MG HALF TABLET
12.5000 mg | ORAL_TABLET | Freq: Every day | ORAL | Status: DC
  Administered 2021-08-10 – 2021-08-12 (×3): 12.5 mg via ORAL
  Filled 2021-08-09 (×3): qty 1

## 2021-08-09 MED ORDER — APIXABAN 5 MG PO TABS
5.0000 mg | ORAL_TABLET | Freq: Two times a day (BID) | ORAL | Status: DC
  Administered 2021-08-09 – 2021-08-13 (×9): 5 mg via ORAL
  Filled 2021-08-09 (×9): qty 1

## 2021-08-09 MED ORDER — NITROGLYCERIN 0.4 MG SL SUBL: 0.4000 mg | SUBLINGUAL_TABLET | SUBLINGUAL | Status: DC | PRN

## 2021-08-09 MED ORDER — FUROSEMIDE 10 MG/ML IJ SOLN
40.0000 mg | Freq: Two times a day (BID) | INTRAMUSCULAR | Status: DC
  Administered 2021-08-10 (×2): 40 mg via INTRAVENOUS
  Filled 2021-08-09 (×3): qty 4

## 2021-08-09 MED ORDER — FUROSEMIDE 10 MG/ML IJ SOLN
40.0000 mg | Freq: Once | INTRAMUSCULAR | Status: AC
  Administered 2021-08-09: 40 mg via INTRAVENOUS
  Filled 2021-08-09: qty 4

## 2021-08-09 MED ORDER — POLYETHYLENE GLYCOL 3350 17 G PO PACK: 17.0000 g | PACK | Freq: Every day | ORAL | Status: DC | PRN

## 2021-08-09 MED ORDER — METOPROLOL SUCCINATE ER 25 MG PO TB24
25.0000 mg | ORAL_TABLET | Freq: Every day | ORAL | Status: DC
  Administered 2021-08-11 – 2021-08-12 (×2): 25 mg via ORAL
  Filled 2021-08-09 (×2): qty 1

## 2021-08-09 MED ORDER — SACUBITRIL-VALSARTAN 24-26 MG PO TABS
1.0000 | ORAL_TABLET | Freq: Two times a day (BID) | ORAL | Status: DC
  Administered 2021-08-09 – 2021-08-10 (×2): 1 via ORAL
  Filled 2021-08-09 (×2): qty 1

## 2021-08-09 MED ORDER — EZETIMIBE 10 MG PO TABS
10.0000 mg | ORAL_TABLET | Freq: Every day | ORAL | Status: DC
  Administered 2021-08-10 – 2021-08-21 (×12): 10 mg via ORAL
  Filled 2021-08-09 (×12): qty 1

## 2021-08-09 NOTE — ED Provider Triage Note (Signed)
Emergency Medicine Provider Triage Evaluation Note  [name] , a 66 y.o. male  was evaluated in triage.  Pt complains of shortness of breath onset 2 weeks.  Has associated swelling in bilateral legs and swelling to genital area.  Has not tried a medication for symptoms.  Denies chest pain, fever, chills, nausea, vomiting. Patient has a history of heart failure.  He currently takes Lasix.  He last had a catheterization done on 03/31/2021 due to a STEMI. His cardiologist is Dr. Haroldine Laws who recommended he come into the ED for further evaluation of his symptoms.  Denies past medical history of prostate cancer, testicular cancer, bladder cancer.  Denies dysuria, hematuria.  Review of Systems  Positive: As per HPI above. Negative: Fever, chills  Physical Exam  BP (!) 123/97    Pulse 93    Temp 97.6 F (36.4 C) (Oral)    Resp 18    Ht 5\' 7"  (1.702 m)    Wt 94 kg    SpO2 100%    BMI 32.46 kg/m  Gen:   Awake, no distress   Resp:  Normal effort  MSK:   Moves extremities without difficulty  Other:  1+ pitting edema noted bilaterally to the level of the knee.  Genital exam deferred in triage.  Medical Decision Making  Medically screening exam initiated at 5:02 PM.  Appropriate orders placed.  [name] was informed that the remainder of the evaluation will be completed by another provider, this initial triage assessment does not replace that evaluation, and the importance of remaining in the ED until their evaluation is complete.  5:08 PM - Discussed with RN that patient is in need of a room immediately. RN aware and working on room placement.    Dontel Harshberger A, PA-C 08/09/21 1709

## 2021-08-09 NOTE — ED Provider Notes (Signed)
[facility] Emergency Department Provider Note MRN:  [id number]  Arrival date & time: 08/09/21     Chief Complaint   Leg Swelling   History of Present Illness   [name] is a 66 y.o. year-old male with a history of coronary artery disease, chronic diastolic heart failure, ischemic cardiomyopathy, atrial fibrillation, history of PE, hypertension, CKD 3 presenting to the ED with chief complaint of peripheral swelling.  [name] is accompanied by his wife who provides portions of the history.  They report that the patient is developed worsening lower extremity swelling over the past week however he is recently developed testicular swelling over the past few days.  The patient states that this testicular swelling has worsened to the point where he is unable to function with his daily activities of living.  Patient states that he has been compliant with his medications including spironolactone.  Denies that he has been having chest pain but does admit to shortness of breath and orthopnea.  Also denies dysuria, hematuria, or trauma to his testicular area.  He was recently admitted on 03/31/2021 with inferior STEMI.  Most recent echocardiogram on 04/01/2021 reviewed revealed an EF of 20 to 25% with component of right heart strain.  Review of Systems  A thorough review of systems was obtained and all systems are negative except as noted in the HPI and PMH.   Patient's Health History    Past Medical History:  Diagnosis Date   Acute pulmonary embolism without acute cor pulmonale (HCC)    PE-DVT during hospitalization Nov-Dec 2021 On Eliquis at discharge   Acute ST elevation myocardial infarction (STEMI) involving other coronary artery of inferior wall (HCC) 03/31/2021   Acute ST elevation myocardial infarction (STEMI) of inferior wall ([place]) 03/31/2021   Borderline diabetic    Cardiogenic shock (HCC)    Coronary artery disease    a. s/p CABG in 2014 with LIMA-LAD,  SVG-OM2 and SVG-RC b. 03/2019: STEMI and s/p angioplasty to proximal SVG-RCA and DES to SVG-RCA anastomosis c. 04/2020: recurrent STEMI with angioplasty of SVG-RCA   Dyslipidemia 01/11/2013   Hiatal hernia    Hypertension    Mini stroke    2   Respiratory failure with hypoxia (HCC)    S/P CABG x 3    Sleep disturbance    ST elevation myocardial infarction (STEMI) ([place]) 09/01/2012   Pt presented 03/23/2019 with inferior STEMI complicated by respiratory failure- intubation   STEMI (ST elevation myocardial infarction) ([place])    Stroke ([place])    Transaminitis     Past Surgical History:  Procedure Laterality Date   CARDIAC CATHETERIZATION     CORONARY ARTERY BYPASS GRAFT N/A 09/01/2012   Procedure: CORONARY ARTERY BYPASS GRAFTING (CABG);  Surgeon: Gaye Pollack, MD;  Location: [facility];  Service: Open Heart Surgery;  Laterality: N/A;  Coronary Artery Bypass Grafting Times Three Using Left Internal Mammary Artery and Left Saphenous leg Vein Harvested Endoscopically   CORONARY STENT INTERVENTION N/A 03/23/2019   Procedure: CORONARY STENT INTERVENTION;  Surgeon: Lorretta Harp, MD;  Location: [place] [facility];  Service: Cardiovascular;  Laterality: N/A;   CORONARY/GRAFT ACUTE MI REVASCULARIZATION N/A 03/23/2019   Procedure: Coronary/Graft Acute MI Revascularization;  Surgeon: Lorretta Harp, MD;  Location: [place] [facility];  Service: Cardiovascular;  Laterality: N/A;   CORONARY/GRAFT ACUTE MI REVASCULARIZATION N/A 05/03/2020   Procedure: Coronary/Graft Acute MI Revascularization;  Surgeon: Belva Crome, MD;  Location: [place] [facility];  Service: Cardiovascular;  Laterality: N/A;   IABP INSERTION N/A 06/04/2020   Procedure: IABP Insertion;  Surgeon: Martinique, Peter M, MD;  Location: [place] [facility];  Service: Cardiovascular;  Laterality: N/A;   ICD IMPLANT N/A 06/19/2020   Procedure: ICD IMPLANT;  Surgeon: Vickie Epley, MD;  Location: [place] [facility];  Service: Cardiovascular;   Laterality: N/A;   INTRAOPERATIVE TRANSESOPHAGEAL ECHOCARDIOGRAM N/A 09/01/2012   Procedure: INTRAOPERATIVE TRANSESOPHAGEAL ECHOCARDIOGRAM;  Surgeon: Gaye Pollack, MD;  Location: [place] [facility];  Service: Open Heart Surgery;  Laterality: N/A;   LEFT HEART CATH AND CORONARY ANGIOGRAPHY N/A 03/23/2019   Procedure: LEFT HEART CATH AND CORONARY ANGIOGRAPHY;  Surgeon: Lorretta Harp, MD;  Location: [place] [facility];  Service: Cardiovascular;  Laterality: N/A;   LEFT HEART CATH AND CORONARY ANGIOGRAPHY N/A 05/03/2020   Procedure: LEFT HEART CATH AND CORONARY ANGIOGRAPHY;  Surgeon: Belva Crome, MD;  Location: [place] [facility];  Service: Cardiovascular;  Laterality: N/A;   LEFT HEART CATH AND CORS/GRAFTS ANGIOGRAPHY N/A 03/31/2021   Procedure: LEFT HEART CATH AND CORS/GRAFTS ANGIOGRAPHY;  Surgeon: Leonie Man, MD;  Location: [place] [facility];  Service: Cardiovascular;  Laterality: N/A;   LEFT HEART CATHETERIZATION WITH CORONARY ANGIOGRAM N/A 08/31/2012   Procedure: LEFT HEART CATHETERIZATION WITH CORONARY ANGIOGRAM;  Surgeon: Peter M Martinique, MD;  Location: [facility];  Service: Cardiovascular;  Laterality: N/A;   PERCUTANEOUS CORONARY STENT INTERVENTION (PCI-S) N/A 08/31/2012   Procedure: PERCUTANEOUS CORONARY STENT INTERVENTION (PCI-S);  Surgeon: Peter M Martinique, MD;  [facility] C[facility] LAB;  Service: Cardiovascular;  Laterality: N/A;   RIGHT/LEFT HEART CATH AND CORONARY/GRAFT ANGIOGRAPHY N/A 06/04/2020   Procedure: RIGHT/LEFT HEART CATH AND CORONARY/GRAFT ANGIOGRAPHY;  Surgeon: Martinique, Peter M, MD;  Location: [place] [facility];  Service: Cardiovascular;  Laterality: N/A;   TEMPORARY PACEMAKER N/A 06/04/2020   Procedure: TEMPORARY PACEMAKER;  Surgeon: Martinique, Peter M, MD;  Location: [place] [facility];  Service: Cardiovascular;  Laterality: N/A;    Family History  Problem Relation Age of Onset   Cancer Father    Hypertension Father    Hyperlipidemia Father    Heart attack Father    Hypertension  Sister    Hypertension Brother    Hyperlipidemia Brother    Heart attack Sister    Heart attack Brother     Social History   Socioeconomic History   Marital status: Married    Spouse name: Not on file   Number of children: Not on file   Years of education: Not on file   Highest education level: Not on file  Occupational History   Not on file  Tobacco Use   Smoking status: Former    Types: Pipe   Smokeless tobacco: Former    Quit date: 07/15/1968  Vaping Use   Vaping Use: Never used  Substance and Sexual Activity   Alcohol use: No   Drug use: No   Sexual activity: Not Currently  Other Topics Concern   Not on file  Social History Narrative   Not on file   Social Determinants of Health   Financial Resource Strain: Not on file  Food Insecurity: Not on file  Transportation Needs: Not on file  Physical Activity: Not on file  Stress: Not on file  Social Connections: Not on file  Intimate Partner Violence: Not on file     Physical Exam   Physical Exam Vitals and nursing note reviewed.  Constitutional:      Appearance: Normal appearance.  Cardiovascular:     Rate and Rhythm: Normal rate and regular rhythm.  Pulmonary:     Effort: Pulmonary effort is normal.     Breath sounds: Rales present.  Abdominal:     General: Abdomen is flat.     Palpations: Abdomen is soft.  Musculoskeletal:     Right lower leg: Edema present.     Left lower leg: Edema present.     Comments: Referral edema pitting to knees bilaterally.  Patient also with testicular swelling.  Skin:    General: Skin is warm and dry.     Capillary Refill: Capillary refill takes less than 2 seconds.  Neurological:     General: No focal deficit present.     Mental Status: He is alert and oriented to person, place, and time. Mental status is at baseline.      Diagnostic and Interventional Summary    Labs Reviewed  BASIC METABOLIC PANEL - Abnormal; Notable for the following components:      Result  Value   Sodium 134 (*)    CO2 20 (*)    Glucose, Bld 111 (*)    BUN 47 (*)    Creatinine, Ser 1.87 (*)    GFR, Estimated 39 (*)    All other components within normal limits  CBC - Abnormal; Notable for the following components:   Hemoglobin 12.9 (*)    MCH 25.1 (*)    RDW 17.2 (*)    All other components within normal limits  TROPONIN I (HIGH SENSITIVITY) - Abnormal; Notable for the following components:   Troponin I (High Sensitivity) 31 (*)    All other components within normal limits  TROPONIN I (HIGH SENSITIVITY) - Abnormal; Notable for the following components:   Troponin I (High Sensitivity) 32 (*)    All other components within normal limits  BRAIN NATRIURETIC PEPTIDE  HIV ANTIBODY (ROUTINE TESTING W REFLEX)  MAGNESIUM  BASIC METABOLIC PANEL  CBC    DG Chest 2 View  Final Result      Medications  sacubitril-valsartan (ENTRESTO) 24-26 mg per tablet (has no administration in time range)  atorvastatin (LIPITOR) tablet 80 mg (has no administration in time range)  ezetimibe (ZETIA) tablet 10 mg (has no administration in time range)  nitroGLYCERIN (NITROSTAT) SL tablet 0.4 mg (has no administration in time range)  spironolactone (ALDACTONE) tablet 12.5 mg (has no administration in time range)  escitalopram (LEXAPRO) tablet 10 mg (has no administration in time range)  dapagliflozin propanediol (FARXIGA) tablet 10 mg (has no administration in time range)  apixaban (ELIQUIS) tablet 5 mg (has no administration in time range)  clopidogrel (PLAVIX) tablet 75 mg (has no administration in time range)  nystatin (MYCOSTATIN/NYSTOP) topical powder 1 application (has no administration in time range)  furosemide (LASIX) injection 40 mg (has no administration in time range)  sodium chloride flush (NS) 0.9 % injection 3 mL (has no administration in time range)  acetaminophen (TYLENOL) tablet 650 mg (has no administration in time range)    Or  acetaminophen (TYLENOL) suppository 650 mg  (has no administration in time range)  polyethylene glycol (MIRALAX / GLYCOLAX) packet 17 g (has no administration in time range)  furosemide (LASIX) injection 40 mg (40 mg Intravenous Given 08/09/21 1835)     Procedures  /  Critical Care Procedures  ED Course and Medical Decision Making  Initial Impression and Ddx 66 year old male with past medical history significant for heart failure presenting to emergency department for lower extremity swelling.  Differential diagnosis includes but is tolerated to the following: Right heart failure, heart failure exacerbation, bilateral DVTs, cellulitis.  I felt that the most likely diagnosis at this time is right heart failure given that the patient is not having pulmonary symptoms at this time.  Past medical/surgical history that increases complexity of ED encounter: As per above history of heart failure coronary artery disease  Interpretation of Diagnostics I personally reviewed the EKG, Chest Xray, and Cardiac Monitor and my interpretation is as follows: Chest x-ray reveals no acute cardiopulmonary abnormalities, EKG reveals the patient is in normal sinus rhythm and unchanged from previous.  Patient was noted to be in sinus rhythm on the heart monitor.  Lab work-up was overall unremarkable however the patient is experiencing serious symptoms based off of his lower extremity edema.  We will provide the patient with 40 mg IV Lasix.      Patient Reassessment and Ultimate Disposition/Management The hospitalist team was consulted for admission given the patient's serious symptoms.  They agreed to admit the patient to their service for further management of his suspected right heart failure.  Patient management required discussion with the following services or consulting groups:  Hospitalist Service  Complexity of Problems Addressed Chronic illness with severe exacerbation  Additional Data Reviewed and Analyzed Further history obtained from: Recent  PCP notes and Recent Consult notes As per above  factors Impacting ED Encounter Risk Consideration of hospitalization    Final Clinical Impressions(s) / ED Diagnoses     ICD-10-CM   1. Peripheral edema  R60.9       ED Discharge Orders     None        Discharge Instructions Discussed with and Provided to Patient:   Discharge Instructions   None       Zachery Dakins, MD 08/09/21 2237    Carmin Muskrat, MD 08/09/21 2326

## 2021-08-09 NOTE — ED Triage Notes (Signed)
Pt arrived via POV for c/o SOB, swelling in legs bilat and genital area. Pt has 2+ edema of legs and feet bilat. Pt eupneic.

## 2021-08-09 NOTE — ED Notes (Signed)
Pt had 7-9 beat run of vtach. Rhythm strip showed to EDP. New EKG shot. Pt denies chest pain or any other new symptoms.

## 2021-08-09 NOTE — H&P (Signed)
History and Physical   [name] JWJ:[id number] DOB: January 05, 1956 DOA: 08/09/2021  PCP: [name], MD   Patient coming from: Home  Chief Complaint: Edema, shortness of breath  HPI: [name] is a 66 y.o. male with medical history significant of V. tach status post ICD, thoracic aortic aneurysm, CVA with right hemiparesis, CKD 3A, hyperlipidemia, CAD status post CABG and in graft stenosis, atrial fibrillation, CHF, pulmonary embolism, hypertension who presents with ongoing shortness of breath and edema  Patient reports worsening edema shortness of breath for the past 2 to 3 days..  He has significant lower extremity edema and also reports scrotal edema.  He states he is feeling somewhat generally unwell the last few days as well and has missed multiple medications.  He denies fevers, chills, chest pain, abdominal pain, constipation, diarrhea, nausea, vomiting.   ED Course: Vital signs in the ED stable.  Lab work-up showed BMP with sodium 134, bicarb 20, BUN 47, creatinine 1.87 from baseline of 1.5.  CBC with hemoglobin 12.9 down from baseline of 15.  Troponin flat at 31 and 32 on repeat.  BNP pending. Lasix ordered in ED.  Review of Systems: As per HPI otherwise all other systems reviewed and are negative.  Past Medical History:  Diagnosis Date   Acute pulmonary embolism without acute cor pulmonale (HCC)    PE-DVT during hospitalization Nov-Dec 2021 On Eliquis at discharge   Acute ST elevation myocardial infarction (STEMI) involving other coronary artery of inferior wall (HCC) 03/31/2021   Acute ST elevation myocardial infarction (STEMI) of inferior wall ([place]) 03/31/2021   Borderline diabetic    Cardiogenic shock (HCC)    Coronary artery disease    a. s/p CABG in 2014 with LIMA-LAD, SVG-OM2 and SVG-RC b. 03/2019: STEMI and s/p angioplasty to proximal SVG-RCA and DES to SVG-RCA anastomosis c. 04/2020: recurrent STEMI with angioplasty of SVG-RCA   Dyslipidemia 01/11/2013    Hiatal hernia    Hypertension    Mini stroke    2   Respiratory failure with hypoxia (HCC)    S/P CABG x 3    Sleep disturbance    ST elevation myocardial infarction (STEMI) ([place]) 09/01/2012   Pt presented 03/23/2019 with inferior STEMI complicated by respiratory failure- intubation   STEMI (ST elevation myocardial infarction) ([place])    Stroke ([place])    Transaminitis     Past Surgical History:  Procedure Laterality Date   CARDIAC CATHETERIZATION     CORONARY ARTERY BYPASS GRAFT N/A 09/01/2012   Procedure: CORONARY ARTERY BYPASS GRAFTING (CABG);  Surgeon: Gaye Pollack, MD;  Location: [place];  Service: Open Heart Surgery;  Laterality: N/A;  Coronary Artery Bypass Grafting Times Three Using Left Internal Mammary Artery and Left Saphenous leg Vein Harvested Endoscopically   CORONARY STENT INTERVENTION N/A 03/23/2019   Procedure: CORONARY STENT INTERVENTION;  Surgeon: Lorretta Harp, MD;  Location: [place] [facility];  Service: Cardiovascular;  Laterality: N/A;   CORONARY/GRAFT ACUTE MI REVASCULARIZATION N/A 03/23/2019   Procedure: Coronary/Graft Acute MI Revascularization;  Surgeon: Lorretta Harp, MD;  Location: [place] [facility];  Service: Cardiovascular;  Laterality: N/A;   CORONARY/GRAFT ACUTE MI REVASCULARIZATION N/A 05/03/2020   Procedure: Coronary/Graft Acute MI Revascularization;  Surgeon: Belva Crome, MD;  Location: [place] [facility];  Service: Cardiovascular;  Laterality: N/A;   IABP INSERTION N/A 06/04/2020   Procedure: IABP Insertion;  Surgeon: Martinique, Peter M, MD;  Location: [place] [facility];  Service: Cardiovascular;  Laterality: N/A;  ICD IMPLANT N/A 06/19/2020   Procedure: ICD IMPLANT;  Surgeon: Vickie Epley, MD;  Location: [place] [facility];  Service: Cardiovascular;  Laterality: N/A;   INTRAOPERATIVE TRANSESOPHAGEAL ECHOCARDIOGRAM N/A 09/01/2012   Procedure: INTRAOPERATIVE TRANSESOPHAGEAL ECHOCARDIOGRAM;  Surgeon: Gaye Pollack, MD;  Location: [place] OR;  Service:  Open Heart Surgery;  Laterality: N/A;   LEFT HEART CATH AND CORONARY ANGIOGRAPHY N/A 03/23/2019   Procedure: LEFT HEART CATH AND CORONARY ANGIOGRAPHY;  Surgeon: Lorretta Harp, MD;  Location: [place] [facility];  Service: Cardiovascular;  Laterality: N/A;   LEFT HEART CATH AND CORONARY ANGIOGRAPHY N/A 05/03/2020   Procedure: LEFT HEART CATH AND CORONARY ANGIOGRAPHY;  Surgeon: Belva Crome, MD;  Location: [place] [facility];  Service: Cardiovascular;  Laterality: N/A;   LEFT HEART CATH AND CORS/GRAFTS ANGIOGRAPHY N/A 03/31/2021   Procedure: LEFT HEART CATH AND CORS/GRAFTS ANGIOGRAPHY;  Surgeon: Leonie Man, MD;  Location: [place] [facility];  Service: Cardiovascular;  Laterality: N/A;   LEFT HEART CATHETERIZATION WITH CORONARY ANGIOGRAM N/A 08/31/2012   Procedure: LEFT HEART CATHETERIZATION WITH CORONARY ANGIOGRAM;  Surgeon: Peter M Martinique, MD;  [facility] C[facility];  Service: Cardiovascular;  Laterality: N/A;   PERCUTANEOUS CORONARY STENT INTERVENTION (PCI-S) N/A 08/31/2012   Procedure: PERCUTANEOUS CORONARY STENT INTERVENTION (PCI-S);  Surgeon: Peter M Martinique, MD;  Location: [facility] CATH LAB;  Service: Cardiovascular;  Laterality: N/A;   RIGHT/LEFT HEART CATH AND CORONARY/GRAFT ANGIOGRAPHY N/A 06/04/2020   Procedure: RIGHT/LEFT HEART CATH AND CORONARY/GRAFT ANGIOGRAPHY;  Surgeon: Martinique, Peter M, MD;  Location: [place] [facility];  Service: Cardiovascular;  Laterality: N/A;   TEMPORARY PACEMAKER N/A 06/04/2020   Procedure: TEMPORARY PACEMAKER;  Surgeon: Martinique, Peter M, MD;  Location: [place] [facility];  Service: Cardiovascular;  Laterality: N/A;    Social History  reports that he has quit smoking. His smoking use included pipe. He quit smokeless tobacco use about 53 years ago. He reports that he does not drink alcohol and does not use drugs.  No Known Allergies  Family History  Problem Relation Age of Onset   Cancer Father    Hypertension Father    Hyperlipidemia Father    Heart attack  Father    Hypertension Sister    Hypertension Brother    Hyperlipidemia Brother    Heart attack Sister    Heart attack Brother   Reviewed on admission  Prior to Admission medications   Medication Sig Start Date End Date Taking? Authorizing Provider  apixaban (ELIQUIS) 5 MG TABS tablet Take 1 tablet (5 mg total) by mouth 2 (two) times daily. 09/06/20  Yes Croitoru, Mihai, MD  atorvastatin (LIPITOR) 80 MG tablet TAKE 1 TABLET BY MOUTH DAILY AT 6:00 PM 04/19/21  Yes Croitoru, Mihai, MD  clopidogrel (PLAVIX) 75 MG tablet Take 1 tablet (75 mg total) by mouth daily. 04/16/21  Yes Duke, Tami Lin, PA  dapagliflozin propanediol (FARXIGA) 10 MG TABS tablet Take 1 tablet (10 mg total) by mouth daily. 04/04/21  Yes Bhagat, Bhavinkumar, PA  ENTRESTO 24-26 MG TAKE 1 TABLET BY MOUTH 2 TIMES DAILY. 08/07/21  Yes Croitoru, Mihai, MD  escitalopram (LEXAPRO) 10 MG tablet Take 1 tablet (10 mg total) by mouth daily. 06/29/20  Yes Angiulli, Lavon Paganini, PA-C  ezetimibe (ZETIA) 10 MG tablet Take 10 mg by mouth daily.   Yes [provider]  Multiple Vitamins-Minerals (ONE-A-DAY MENS 50+ ADVANTAGE) TABS Take 1 tablet by mouth daily with breakfast.   Yes [provider]  nystatin (  MYCOSTATIN/NYSTOP) powder APPLY TOPICALLY TO THE AFFECTED AREA 2 TIMES A DAY FOR 2 WEEKS Patient taking differently: 1 application 2 (two) times daily as needed (rash). 04/16/21  Yes Duke, Tami Lin, PA  spironolactone (ALDACTONE) 25 MG tablet Take 1/2 tablet (12.5 mg total) by mouth daily. 04/04/21  Yes Bhagat, Bhavinkumar, PA  amiodarone (PACERONE) 200 MG tablet TAKE 1 TABLET BY MOUTH 2 TIMES DAILY. Patient not taking: Reported on 04/16/2021 03/05/21   Croitoru, Dani Gobble, MD  ezetimibe (ZETIA) 10 MG tablet Take 1 tablet (10 mg total) by mouth daily. 04/16/21 07/15/21  Ledora Bottcher, PA  metoprolol succinate (TOPROL-XL) 25 MG 24 hr tablet Take 1 tablet (25 mg total) by mouth daily. Patient not taking: Reported on 08/09/2021  04/03/21   Leanor Kail, PA  nitroGLYCERIN (NITROSTAT) 0.4 MG SL tablet Place 1 tablet (0.4 mg total) under the tongue every 5 (five) minutes x 3 doses as needed for chest pain. 06/29/20   Cathlyn Parsons, PA-C    Physical Exam: Vitals:   08/09/21 2000 08/09/21 2030 08/09/21 2100 08/09/21 2152  BP: (!) 125/95 (!) 115/96 101/75 115/83  Pulse: 93 92 92 93  Resp: (!) 22 (!) 21 (!) 25 14  Temp:      TempSrc:      SpO2: 95% 95% 97% 97%  Weight:      Height:       Physical Exam Constitutional:      General: He is not in acute distress.    Appearance: Normal appearance.  HENT:     Head: Normocephalic and atraumatic.     Mouth/Throat:     Mouth: Mucous membranes are moist.     Pharynx: Oropharynx is clear.  Eyes:     Extraocular Movements: Extraocular movements intact.     Pupils: Pupils are equal, round, and reactive to light.  Cardiovascular:     Rate and Rhythm: Normal rate and regular rhythm.     Pulses: Normal pulses.     Heart sounds: Normal heart sounds.  Pulmonary:     Effort: Pulmonary effort is normal. No respiratory distress.     Breath sounds: Normal breath sounds.  Abdominal:     General: Bowel sounds are normal. There is no distension.     Palpations: Abdomen is soft.     Tenderness: There is no abdominal tenderness.  Genitourinary:    Comments: Scrotal edema Musculoskeletal:        General: No swelling or deformity.     Right lower leg: Edema present.     Left lower leg: Edema present.  Skin:    General: Skin is warm and dry.  Neurological:     General: No focal deficit present.     Mental Status: Mental status is at baseline.   Labs on Admission: I have personally reviewed following labs and imaging studies  CBC: Recent Labs  Lab 08/09/21 1645  WBC 10.5  HGB 12.9*  HCT 41.6  MCV 81.1  PLT 563    Basic Metabolic Panel: Recent Labs  Lab 08/09/21 1645  NA 134*  K 4.9  CL 102  CO2 20*  GLUCOSE 111*  BUN 47*  CREATININE 1.87*   CALCIUM 9.0    GFR: Estimated Creatinine Clearance: 41.9 mL/min (A) (by C-G formula based on SCr of 1.87 mg/dL (H)).  Liver Function Tests: No results for input(s): AST, ALT, ALKPHOS, BILITOT, PROT, ALBUMIN in the last 168 hours.  Urine analysis:    Component Value Date/Time   COLORURINE  YELLOW 06/22/2020 1728   APPEARANCEUR HAZY (A) 06/22/2020 1728   LABSPEC 1.018 06/22/2020 1728   PHURINE 5.0 06/22/2020 1728   GLUCOSEU NEGATIVE 06/22/2020 1728   HGBUR NEGATIVE 06/22/2020 1728   BILIRUBINUR NEGATIVE 06/22/2020 1728   KETONESUR NEGATIVE 06/22/2020 1728   PROTEINUR 30 (A) 06/22/2020 1728   UROBILINOGEN 0.2 01/06/2011 2117   NITRITE NEGATIVE 06/22/2020 1728   LEUKOCYTESUR NEGATIVE 06/22/2020 1728    Radiological Exams on Admission: DG Chest 2 View  Result Date: 08/09/2021 CLINICAL DATA:  Shortness of breath EXAM: CHEST - 2 VIEW COMPARISON:  04/02/2021 FINDINGS: Left AICD remains in place, unchanged. Prior median sternotomy. Heart is mildly enlarged. Lungs clear. No effusions or edema. No acute bony abnormality. IMPRESSION: Mild cardiomegaly.  No active disease. Electronically Signed   By: Rolm Baptise M.D.   On: 08/09/2021 19:19    EKG: Independently reviewed.  Sinus rhythm at 92 bpm.  Nonspecific T wave changes.  Assessment/Plan Principal Problem:   CHF exacerbation (HCC) Active Problems:   Essential hypertension   Hyperlipidemia with target LDL less than 70   Status post CVA   History of ventricular tachycardia   Acute renal failure superimposed on stage 3a chronic kidney disease (HCC)   CAD, multiple vessel -> severe native vessel CAD-CABG 2014   History of pulmonary embolus (PE) - on Eliquis   PAF (paroxysmal atrial fibrillation) (HCC)  CHF exacerbation > Patient presenting with ongoing edema and shortness of breath.  Not requiring oxygen however has significant edema in bilateral lower extremities and scrotum.  Suspect this is secondary to his history of RV  failure. > Last echo in September 2022 with EF 76-19%, grade 2 diastolic dysfunction, moderately reduced RV function, moderate mitral regurgitation.  Is status post ICD. > Received Lasix in the ED. - Monitor on telemetry - Continue with Lasix twice daily - Strict I's and O's, daily weights - Trend renal function electrolytes - Check magnesium - Continue home spironolactone, Entresto, Farxiga, metoprolol  AKI on CKD 3A > Creatinine elevated to 1.7 from baseline around 1.5 > In the setting of CHF exacerbation as above. - Continue with Lasix as above - Avoid nephrotoxic agents - Trend renal function and electrolytes  Hyperlipidemia CAD > Status post CABG x3 with one graft completely stenosed.  Medically managed. - Continue home Zetia- - Continue home Plavix, Eliquis, Entresto, metoprolol  History of V. tach Atrial fibrillation > Status post ICD - Continue home metoprolol, amiodarone - Continue home Eliquis  History of CVA > Right hemiparesis - Continue home Plavix - Continue home Zetia  Hypertension - Continue home spironolactone, Entresto, metoprolol  History of pulmonary embolism - On Eliquis as above  DVT prophylaxis: Eliquis  Code Status:   Full Family Communication:  None on admission, he states his family knows he is here and is staying.  Disposition Plan:   Patient is from:  Home  Anticipated DC to:  Home  Anticipated DC date:  1 to 4 days  Anticipated DC barriers: None  Consults called:  None  Admission status:  Observation, telemetry Severity of Illness: The appropriate patient status for this patient is OBSERVATION. Observation status is judged to be reasonable and necessary in order to provide the required intensity of service to ensure the patient's safety. The patient's presenting symptoms, physical exam findings, and initial radiographic and laboratory data in the context of their medical condition is felt to place them at decreased risk for further  clinical deterioration. Furthermore, it is anticipated that  the patient will be medically stable for discharge from the hospital within 2 midnights of admission.     [name] Bruins MD [facility]  How to contact the [facility] Attending or Consulting provider [place] or covering provider during after hours [place], for this patient?   Check the care team in [facility] and look for a) attending/consulting TRH provider listed and b) the [facility] team listed Log into [email or web address] and use [place]'s universal password to access. If you do not have the password, please contact the hospital operator. Locate the [facility] provider you are looking for under [facility] and page to a number that you can be directly reached. If you still have difficulty reaching the provider, please page the [facility] (Director on Call) for the Hospitalists listed on amion for assistance.  08/09/2021, 10:25 PM

## 2021-08-10 ENCOUNTER — Observation Stay (HOSPITAL_COMMUNITY): Payer: Medicare PPO

## 2021-08-10 ENCOUNTER — Encounter (HOSPITAL_COMMUNITY): Payer: Self-pay | Admitting: Internal Medicine

## 2021-08-10 DIAGNOSIS — I493 Ventricular premature depolarization: Secondary | ICD-10-CM | POA: Diagnosis present

## 2021-08-10 DIAGNOSIS — R0609 Other forms of dyspnea: Secondary | ICD-10-CM

## 2021-08-10 DIAGNOSIS — I252 Old myocardial infarction: Secondary | ICD-10-CM | POA: Diagnosis not present

## 2021-08-10 DIAGNOSIS — I130 Hypertensive heart and chronic kidney disease with heart failure and stage 1 through stage 4 chronic kidney disease, or unspecified chronic kidney disease: Secondary | ICD-10-CM | POA: Diagnosis present

## 2021-08-10 DIAGNOSIS — I255 Ischemic cardiomyopathy: Secondary | ICD-10-CM | POA: Diagnosis present

## 2021-08-10 DIAGNOSIS — I25810 Atherosclerosis of coronary artery bypass graft(s) without angina pectoris: Secondary | ICD-10-CM | POA: Diagnosis present

## 2021-08-10 DIAGNOSIS — E876 Hypokalemia: Secondary | ICD-10-CM | POA: Diagnosis present

## 2021-08-10 DIAGNOSIS — N1832 Chronic kidney disease, stage 3b: Secondary | ICD-10-CM | POA: Diagnosis present

## 2021-08-10 DIAGNOSIS — I69351 Hemiplegia and hemiparesis following cerebral infarction affecting right dominant side: Secondary | ICD-10-CM | POA: Diagnosis not present

## 2021-08-10 DIAGNOSIS — I5082 Biventricular heart failure: Secondary | ICD-10-CM | POA: Diagnosis present

## 2021-08-10 DIAGNOSIS — Z8673 Personal history of transient ischemic attack (TIA), and cerebral infarction without residual deficits: Secondary | ICD-10-CM | POA: Diagnosis not present

## 2021-08-10 DIAGNOSIS — I509 Heart failure, unspecified: Secondary | ICD-10-CM

## 2021-08-10 DIAGNOSIS — R609 Edema, unspecified: Secondary | ICD-10-CM | POA: Diagnosis present

## 2021-08-10 DIAGNOSIS — Z20822 Contact with and (suspected) exposure to covid-19: Secondary | ICD-10-CM | POA: Diagnosis present

## 2021-08-10 DIAGNOSIS — I480 Paroxysmal atrial fibrillation: Secondary | ICD-10-CM | POA: Diagnosis present

## 2021-08-10 DIAGNOSIS — I959 Hypotension, unspecified: Secondary | ICD-10-CM | POA: Diagnosis present

## 2021-08-10 DIAGNOSIS — E871 Hypo-osmolality and hyponatremia: Secondary | ICD-10-CM | POA: Diagnosis present

## 2021-08-10 DIAGNOSIS — I2510 Atherosclerotic heart disease of native coronary artery without angina pectoris: Secondary | ICD-10-CM | POA: Diagnosis present

## 2021-08-10 DIAGNOSIS — Z95810 Presence of automatic (implantable) cardiac defibrillator: Secondary | ICD-10-CM | POA: Diagnosis not present

## 2021-08-10 DIAGNOSIS — I5023 Acute on chronic systolic (congestive) heart failure: Secondary | ICD-10-CM

## 2021-08-10 DIAGNOSIS — I10 Essential (primary) hypertension: Secondary | ICD-10-CM | POA: Diagnosis not present

## 2021-08-10 DIAGNOSIS — Z7901 Long term (current) use of anticoagulants: Secondary | ICD-10-CM | POA: Diagnosis not present

## 2021-08-10 DIAGNOSIS — I712 Thoracic aortic aneurysm, without rupture, unspecified: Secondary | ICD-10-CM | POA: Diagnosis present

## 2021-08-10 DIAGNOSIS — I5084 End stage heart failure: Secondary | ICD-10-CM | POA: Diagnosis present

## 2021-08-10 DIAGNOSIS — E785 Hyperlipidemia, unspecified: Secondary | ICD-10-CM | POA: Diagnosis present

## 2021-08-10 DIAGNOSIS — Z955 Presence of coronary angioplasty implant and graft: Secondary | ICD-10-CM | POA: Diagnosis not present

## 2021-08-10 DIAGNOSIS — N179 Acute kidney failure, unspecified: Secondary | ICD-10-CM | POA: Diagnosis present

## 2021-08-10 DIAGNOSIS — Z8674 Personal history of sudden cardiac arrest: Secondary | ICD-10-CM | POA: Diagnosis not present

## 2021-08-10 DIAGNOSIS — Z87891 Personal history of nicotine dependence: Secondary | ICD-10-CM | POA: Diagnosis not present

## 2021-08-10 LAB — CBC
HCT: 40.5 % (ref 39.0–52.0)
Hemoglobin: 13.1 g/dL (ref 13.0–17.0)
MCH: 26.1 pg (ref 26.0–34.0)
MCHC: 32.3 g/dL (ref 30.0–36.0)
MCV: 80.7 fL (ref 80.0–100.0)
Platelets: 262 10*3/uL (ref 150–400)
RBC: 5.02 MIL/uL (ref 4.22–5.81)
RDW: 17.4 % — ABNORMAL HIGH (ref 11.5–15.5)
WBC: 10.8 10*3/uL — ABNORMAL HIGH (ref 4.0–10.5)
nRBC: 0 % (ref 0.0–0.2)

## 2021-08-10 LAB — ECHOCARDIOGRAM COMPLETE
AR max vel: 3.37 cm2
AV Area VTI: 4.04 cm2
AV Area mean vel: 2.93 cm2
AV Mean grad: 1 mmHg
AV Peak grad: 2.6 mmHg
Ao pk vel: 0.81 m/s
Calc EF: 40 %
Height: 67 in
MV VTI: 0.44 cm2
S' Lateral: 3.7 cm
Single Plane A2C EF: 37 %
Single Plane A4C EF: 46.4 %
Weight: 3315.72 oz

## 2021-08-10 LAB — BASIC METABOLIC PANEL
Anion gap: 12 (ref 5–15)
BUN: 44 mg/dL — ABNORMAL HIGH (ref 8–23)
CO2: 22 mmol/L (ref 22–32)
Calcium: 8.7 mg/dL — ABNORMAL LOW (ref 8.9–10.3)
Chloride: 100 mmol/L (ref 98–111)
Creatinine, Ser: 1.82 mg/dL — ABNORMAL HIGH (ref 0.61–1.24)
GFR, Estimated: 40 mL/min — ABNORMAL LOW (ref >60)
Glucose, Bld: 105 mg/dL — ABNORMAL HIGH (ref 70–99)
Potassium: 3.6 mmol/L (ref 3.5–5.1)
Sodium: 134 mmol/L — ABNORMAL LOW (ref 135–145)

## 2021-08-10 LAB — HIV ANTIBODY (ROUTINE TESTING W REFLEX): HIV Screen 4th Generation wRfx: NONREACTIVE

## 2021-08-10 LAB — RESP PANEL BY RT-PCR (FLU A&B, COVID) ARPGX2
Influenza A by PCR: NEGATIVE
Influenza B by PCR: NEGATIVE
SARS Coronavirus 2 by RT PCR: NEGATIVE

## 2021-08-10 LAB — BRAIN NATRIURETIC PEPTIDE: B Natriuretic Peptide: 2604.2 pg/mL — ABNORMAL HIGH (ref 0.0–100.0)

## 2021-08-10 LAB — MAGNESIUM: Magnesium: 2 mg/dL (ref 1.7–2.4)

## 2021-08-10 MED ORDER — ONDANSETRON HCL 4 MG/2ML IJ SOLN
4.0000 mg | Freq: Once | INTRAMUSCULAR | Status: AC
  Administered 2021-08-10: 4 mg via INTRAVENOUS
  Filled 2021-08-10: qty 2

## 2021-08-10 MED ORDER — POTASSIUM CHLORIDE CRYS ER 20 MEQ PO TBCR
40.0000 meq | EXTENDED_RELEASE_TABLET | Freq: Once | ORAL | Status: AC
  Administered 2021-08-10: 40 meq via ORAL
  Filled 2021-08-10: qty 2

## 2021-08-10 NOTE — TOC Progression Note (Signed)
Transition of Care [facility]) - Progression Note    Patient Details  Name: Trejon Duford MRN: [id number] Date of Birth: [birth year]  Transition of Care [facility]) CM/SW Contact  [name], RN Phone Number: 08/10/2021, 3:50 PM  Clinical Narrative:     Transition of Care [facility]) Screening Note   Patient Details  Name: Idrissa Beville Date of Birth: 1955/11/30   Transition of Care [facility]) CM/SW Contact:    [name], RN Phone Number: 08/10/2021, 3:50 PM    Transition of Care Department [facility]) has reviewed patient and no TOC needs have been identified at this time. We will continue to monitor patient advancement through interdisciplinary progression rounds. If new patient transition needs arise, please place a TOC consult.          Expected Discharge Plan and Services                                                 Social Determinants of Health (SDOH) Interventions Food Insecurity Interventions: Intervention Not Indicated Financial Strain Interventions: Intervention Not Indicated Housing Interventions: Intervention Not Indicated Transportation Interventions: Intervention Not Indicated  Readmission Risk Interventions Readmission Risk Prevention Plan 06/25/2020  PCP or Specialist Appt within 3-5 Days (No Data)  [place] or Home Care Consult Complete  Social Work Consult for English Planning/Counseling Complete  Palliative Care Screening Not Applicable  Medication Review Press photographer) Complete  Some recent data might be hidden

## 2021-08-10 NOTE — Assessment & Plan Note (Addendum)
On clopidogrel and statin therapy  -Beta-blocker on hold -No evidence of ACS

## 2021-08-10 NOTE — ED Notes (Signed)
Pt placed on 2L due to decrease in O2 sat while sleeping. Pt reports having a hx of sleep apnea

## 2021-08-10 NOTE — ED Notes (Signed)
Admitting notified of 10 beat run of Vtach, pt denying CP, reporting diziness that correlated and EKG was snapped

## 2021-08-10 NOTE — ED Notes (Signed)
Admitting paged about pt's run of VTach with a "dizzy" feeling

## 2021-08-10 NOTE — ED Notes (Signed)
Pt denies any CP or SOB

## 2021-08-10 NOTE — Assessment & Plan Note (Addendum)
-   Continue apixaban and statin

## 2021-08-10 NOTE — Progress Notes (Signed)
Progress Note   Patient: [name] [id number] DOB: 12/11/1955 DOA: 08/09/2021     0 DOS: the patient was seen and examined on 08/10/2021   Brief hospital course: Mr. Wery was admitted to the hospital with the working diagnosis of decompensated heart failure.   66 yo male with the past medical history of CVA with right hemiparesis, CKD stage 3a, dyslipidemia, heart failure, thoracic aortic aneurysm, pulmonary embolism, HTN, and V tach sp AICD, who presented with dyspnea and edema. Reported worsening dyspnea for the last 2 to 3 days leading to his hospitalization, associated with significant lower extremity and scrotal edema. He has been not compliant with his medications at home. On his initial physical examination his blood pressure was 125/95, HR 93, RR 22, Oxygen saturation 95% on supplemental 02 per [place]. His lungs had no rales, or wheezing, heart with S1 and S2 present, with no gallops or murmurs, abdomen with no distention, positive lower extremity edema.   Na 134, K 4,9, cl 102, bicarb 20, glucose 111, bun 47 and cr 1,87  High sensitive troponin 31 and 32 Wbc 10,5, hgb 12,0, hct 41,6 and plt 282 SARS COVID 19 negative  Chest radiograph with cardiomegaly and vascular hilar congestion bilaterally   EKG 92 bpm, normal axis, normal intervals, sinus rhythm, with q wave lead II, III, AVF, V4 to V6 with no significant ST segment or T wave changes.   Assessment and Plan * Acute on chronic systolic CHF (congestive heart failure) ([place]) Patient know to have reduced LV systolic function, echocardiogram from 09/22 with EF 20 to 25% with severe hypokinesis and akinesis of the entire lateral wall. RV systolic function was reduced moderately.  Echocardiogram from today with poor windows.   Documented urine output is 3,050 ml and patient is having improvement in edema and dyspnea.  Systolic blood pressure 75 to 95 mmHg.   Plan to continue diuresis with furosemide, close follow up on blood  pressure.  Continue heart failure management with dapagliflozin and spironolactone along with metoprolol succinate. Will hold on entresto for now to prevent further hypotension   Essential hypertension- (present on admission) Patient has been hypotensive, will continue close blood pressure monitoring Continue to hold on entresto.   Acute renal failure superimposed on stage 3a chronic kidney disease (HCC) Hyponatremia and hypokalemia Renal function with serum cr at 1,82 with K at 3,6 and serum bicarbonate at 22 Plan to continue diuresis with furosemide Correct K with 40 meq Kcl and follow renal function and electrolytes in am.   Status post CVA Continue with statin therapy Pt and OT  PAF (paroxysmal atrial fibrillation) ([place])- (present on admission) Continue rate control with metoprolol and amiodarone. Anticoagulation with apixaban Continue telemetry monitoring   CAD, multiple vessel -> severe native vessel CAD-CABG 2014- (present on admission) Patient with no chest pain, continue with beta blockade.  Patient on clopidogrel and statin therapy   History of ventricular tachycardia SP AICD  History of pulmonary embolus (PE) - on Eliquis- (present on admission) Continue anticoagulation with apixaban   Hyperlipidemia with target LDL less than 70- (present on admission) Continue with atorvastatin and ezetimibe      Subjective: Patient is feeling better than yesterday with improved edema at his lower extremities and scrotum but not back to baseline, no chest pain or dyspnea   Objective BP 95/73 (BP Location: Left Arm)    Pulse 77    Temp 98 F (36.7 C) (Oral)    Resp 18    Ht  5\' 7"  (1.702 m)    Wt 86.5 kg    SpO2 96%    BMI 29.87 kg/m   Neurology  awake and alert ENT no pallor or icterus  Cardiovascular heart with S1 and S2 present and rhythmic, with no gallop or rubs, positive systolic murmur at the apex 3/6 with no radiation. Positive lower extremity edema ++ bilaterally   Pulmonary  positive scattered rales Abdominal soft and non tender, positive scrotal edema Skin Musculoskeletal   Data Reviewed:    Family Communication: no family at the beside   Disposition: Status is: Inpatient  Remains inpatient appropriate because: IV diuresis            Author: Tawni Millers, MD 08/10/2021 4:49 PM  For on call review [email or web address].

## 2021-08-10 NOTE — Progress Notes (Signed)
° °  Echocardiogram 2D Echocardiogram has been performed.  Beryle Beams 08/10/2021, 9:03 AM

## 2021-08-10 NOTE — Hospital Course (Addendum)
[name] was admitted to the hospital with the working diagnosis of decompensated heart failure.   67 yo male with the past medical history of CVA with right hemiparesis, CKD stage 3a, dyslipidemia, heart failure, thoracic aortic aneurysm, pulmonary embolism, HTN, and V tach sp AICD, who presented with dyspnea and edema. Reported worsening dyspnea for the last 2 to 3 days leading to his hospitalization, associated with significant lower extremity and scrotal edema. He has been not compliant with his medications at home. On his initial physical examination his blood pressure was 125/95, HR 93, RR 22, Oxygen saturation 95% on supplemental 02 per [place]. His lungs had no rales, or wheezing, heart with S1 and S2 present, with no gallops or murmurs, abdomen with no distention, positive lower extremity edema.   Na 134, K 4,9, cl 102, bicarb 20, glucose 111, bun 47 and cr 1,87  High sensitive troponin 31 and 32 Wbc 10,5, hgb 12,0, hct 41,6 and plt 282 SARS COVID 19 negative  Chest radiograph with cardiomegaly and vascular hilar congestion bilaterally   EKG 92 bpm, normal axis, normal intervals, sinus rhythm, with q wave lead II, III, AVF, V4 to V6 with no significant ST segment or T wave changes.   Patient has diuresed about 4 L since admission with improvement in edema of lower extremities.  He has developed hypotension limiting further diuresis.  Patient with signs of lowe cardiac output.  Advanced hear failure team has been consulted, patient has been placed on dobutamine to allow de-congestion.

## 2021-08-10 NOTE — Assessment & Plan Note (Addendum)
SP AICD -Currently on amnio gtt. -Monitor electrolytes

## 2021-08-10 NOTE — Assessment & Plan Note (Addendum)
See discussion above 

## 2021-08-10 NOTE — Progress Notes (Signed)
Heart Failure Nurse Navigator Progress Note  PCP: Hamrick, Lorin Mercy, MD PCP-Cardiologist: Bertrum Sol., MD Admission Diagnosis: CHF exac Admitted from: home with spouse  Presentation:   [name] presented 1/27 with increased SOB and scrotal edema, BLE edema. Pt resting in bed on room air, HOB 30. Patient interactive with interview process. Pt states he drives reliable vehicle. Endorses non-compliance with medications as he didn't want to take them recently---felt bad for past 3 weeks. Pt states his finances are tight and sometimes he hasn't been able to afford medications vs paying bills.  Explained benefits of Heart & Vascular Transitions of Care Clinic appointment, patient agreeable.    Of note: pt had been referred to AHF clinic 08/2020--pt did not answer phone when scheduler called to arrange appointment.   ECHO/ LVEF: (2022) EF 20-25% 08/10/2021: mod LVH, RV mod reduced, RA/LA severely dilated.   Clinical Course:  Past Medical History:  Diagnosis Date   Acute pulmonary embolism without acute cor pulmonale (HCC)    PE-DVT during hospitalization Nov-Dec 2021 On Eliquis at discharge   Acute ST elevation myocardial infarction (STEMI) involving other coronary artery of inferior wall (HCC) 03/31/2021   Acute ST elevation myocardial infarction (STEMI) of inferior wall ([place]) 03/31/2021   Borderline diabetic    Cardiogenic shock (HCC)    Coronary artery disease    a. s/p CABG in 2014 with LIMA-LAD, SVG-OM2 and SVG-RC b. 03/2019: STEMI and s/p angioplasty to proximal SVG-RCA and DES to SVG-RCA anastomosis c. 04/2020: recurrent STEMI with angioplasty of SVG-RCA   Dyslipidemia 01/11/2013   Hiatal hernia    Hypertension    Mini stroke    2   Respiratory failure with hypoxia (HCC)    S/P CABG x 3    Sleep disturbance    ST elevation myocardial infarction (STEMI) ([place]) 09/01/2012   Pt presented 03/23/2019 with inferior STEMI complicated by respiratory failure- intubation   STEMI (ST  elevation myocardial infarction) ([place])    Stroke ([place])    Transaminitis      Social History   Socioeconomic History   Marital status: Married    Spouse name: [name]   Number of children: 2   Years of education: Not on file   Highest education level: 11th grade  Occupational History   Occupation: retired    Comment: large Database administrator  Tobacco Use   Smoking status: Former    Types: Pipe   Smokeless tobacco: Never  Scientific laboratory technician Use: Never used  Substance and Sexual Activity   Alcohol use: No   Drug use: No   Sexual activity: Not Currently  Other Topics Concern   Not on file  Social History Narrative   Not on file   Social Determinants of Health   Financial Resource Strain: Medium Risk   Difficulty of Paying Living Expenses: Somewhat hard  Food Insecurity: No Food Insecurity   Worried About Charity fundraiser in the Last Year: Never true   Arboriculturist in the Last Year: Never true  Transportation Needs: No Transportation Needs   Lack of Transportation (Medical): No   Lack of Transportation (Non-Medical): No  Physical Activity: Not on file  Stress: Not on file  Social Connections: Not on file    High Risk Criteria for Readmission and/or Poor Patient Outcomes: Heart failure hospital admissions (last 6 months): 1  No Show rate: 6% Difficult social situation: no Demonstrates medication adherence: NO Primary Language: English Literacy level: able to read/write and  comprehend.  Education Assessment and Provision:  Detailed education and instructions provided on heart failure disease management including the following:  Signs and symptoms of Heart Failure When to call the physician Importance of daily weights Low sodium diet Fluid restriction Medication management Anticipated future follow-up appointments  Patient education given on each of the above topics.  Patient acknowledges understanding via teach back method and acceptance of all  instructions.  Education Materials:  "Living Better With Heart Failure" Booklet, HF zone tool, & Daily Weight Tracker Tool.  Patient has scale at home: yes Patient has pill box at home: no, will give at [facility].    Barriers of Care:   -medication compliance -medication cost -dietary indiscretion  Considerations/Referrals:   Referral made to Heart Failure Pharmacist Stewardship: yes, appreciated Referral made to Heart Failure CSW/NCM TOC: yes, appreciated Referral made to Heart & Vascular TOC clinic: yes, 2/7 @ 11AM  Items for Follow-up on DC/TOC: -optimize -medication compliance -medication cost   Pricilla Holm, MSN, RN Heart Failure Nurse Navigator [phone]

## 2021-08-10 NOTE — Assessment & Plan Note (Addendum)
Bilateral DVT  -continue Eliquis

## 2021-08-10 NOTE — Assessment & Plan Note (Addendum)
Continue with atorvastatin and ezetimibe

## 2021-08-10 NOTE — Assessment & Plan Note (Addendum)
Biventricular heart failure, ischemic cardiomyopathy -EF 25% with moderate to severe RV dysfunction [facility] course complicated by hypotension and worsening renal function, low output heart failure -Treated with dobutamine gtt. and IV Lasix, diuresed well, -14 L -Euvolemic now, -Off Lasix and dobutamine, remains on high-dose midodrine, torsemide started -Off Entresto, Farxiga and Aldactone with AKI, hypotension -Dobutamine restarted 2/6 with worsening AKI, plan for discharge home on dobutamine gtt., single-lumen tunneled PICC ordered

## 2021-08-10 NOTE — Assessment & Plan Note (Addendum)
Cardiorenal syndrome  hyponatremia and hypokalemia -Creatinine slowly worsened, baseline creatinine around 1.4, -low output heart failure -Creatinine continues to worsen, up to 3.4 today, dobutamine being restarted, see discussion above -Monitor urine output, BMP in a.m.

## 2021-08-11 DIAGNOSIS — N179 Acute kidney failure, unspecified: Secondary | ICD-10-CM | POA: Diagnosis not present

## 2021-08-11 DIAGNOSIS — I10 Essential (primary) hypertension: Secondary | ICD-10-CM | POA: Diagnosis not present

## 2021-08-11 DIAGNOSIS — I5023 Acute on chronic systolic (congestive) heart failure: Secondary | ICD-10-CM | POA: Diagnosis not present

## 2021-08-11 DIAGNOSIS — I2510 Atherosclerotic heart disease of native coronary artery without angina pectoris: Secondary | ICD-10-CM | POA: Diagnosis not present

## 2021-08-11 LAB — BASIC METABOLIC PANEL
Anion gap: 8 (ref 5–15)
BUN: 36 mg/dL — ABNORMAL HIGH (ref 8–23)
CO2: 28 mmol/L (ref 22–32)
Calcium: 8.1 mg/dL — ABNORMAL LOW (ref 8.9–10.3)
Chloride: 102 mmol/L (ref 98–111)
Creatinine, Ser: 1.91 mg/dL — ABNORMAL HIGH (ref 0.61–1.24)
GFR, Estimated: 38 mL/min — ABNORMAL LOW (ref >60)
Glucose, Bld: 93 mg/dL (ref 70–99)
Potassium: 3.8 mmol/L (ref 3.5–5.1)
Sodium: 138 mmol/L (ref 135–145)

## 2021-08-11 LAB — GLUCOSE, CAPILLARY
Glucose-Capillary: 101 mg/dL — ABNORMAL HIGH (ref 70–99)
Glucose-Capillary: 100 mg/dL — ABNORMAL HIGH (ref 70–99)

## 2021-08-11 LAB — MAGNESIUM: Magnesium: 2.1 mg/dL (ref 1.7–2.4)

## 2021-08-11 MED ORDER — LACTATED RINGERS IV BOLUS
500.0000 mL | Freq: Once | INTRAVENOUS | Status: AC
  Administered 2021-08-11: 500 mL via INTRAVENOUS

## 2021-08-11 NOTE — Progress Notes (Signed)
Progress Note   Patient: [name] PZW:[id number] DOB: 11/07/1955 DOA: 08/09/2021     1 DOS: the patient was seen and examined on 08/11/2021   Brief hospital course: [name] was admitted to the hospital with the working diagnosis of decompensated heart failure.   66 yo male with the past medical history of CVA with right hemiparesis, CKD stage 3a, dyslipidemia, heart failure, thoracic aortic aneurysm, pulmonary embolism, HTN, and V tach sp AICD, who presented with dyspnea and edema. Reported worsening dyspnea for the last 2 to 3 days leading to his hospitalization, associated with significant lower extremity and scrotal edema. He has been not compliant with his medications at home. On his initial physical examination his blood pressure was 125/95, HR 93, RR 22, Oxygen saturation 95% on supplemental 02 per [place]. His lungs had no rales, or wheezing, heart with S1 and S2 present, with no gallops or murmurs, abdomen with no distention, positive lower extremity edema.   Na 134, K 4,9, cl 102, bicarb 20, glucose 111, bun 47 and cr 1,87  High sensitive troponin 31 and 32 Wbc 10,5, hgb 12,0, hct 41,6 and plt 282 SARS COVID 19 negative  Chest radiograph with cardiomegaly and vascular hilar congestion bilaterally   EKG 92 bpm, normal axis, normal intervals, sinus rhythm, with q wave lead II, III, AVF, V4 to V6 with no significant ST segment or T wave changes.   Assessment and Plan * Acute on chronic systolic CHF (congestive heart failure) (HCC) Echocardiogram from 09/22 with EF 20 to 25% with severe hypokinesis and akinesis of the entire lateral wall. RV systolic function was reduced moderately.  Echocardiogram this admission with poor windows.   Ischemic cardiomyopathy.  Urine output 5,150 ml with improvement in his lower extremity and scrotal edema. No dyspnea.  Positive hypotension with systolic blood pressure 80 to 92 mmHg systolic.   Will hold on furosemide today to prevent further  hypotension Continue heart failure management with dapagliflozin, spironolactone and metoprolol succinate. Continue holding on entresto to prevent further hypotension.   Essential hypertension- (present on admission) Patient with hypotension due to aggressive diuresis, will hold on furosemide today and continue blood pressure monitoring Continue metoprolol and spironolactone Continue to hold on Entresto.   Acute renal failure superimposed on stage 3a chronic kidney disease (HCC) Hyponatremia and hypokalemia Renal function with serum cr at 1,91 with K at 3,8 and serum bicarbonate at 28 Plan to continue close monitoring of renal function and electrolytes.  Hold on furosemide for today.    Status post CVA On statin therapy and anticoagulation with apixaban  Pt and OT  PAF (paroxysmal atrial fibrillation) ([name])- (present on admission) Rate control with metoprolol and amiodarone. Continue anticoagulation with apixaban Continue telemetry monitoring   CAD, multiple vessel -> severe native vessel CAD-CABG 2014- (present on admission) No chest pain, continue with beta blockade, clopidogrel and statin therapy   History of ventricular tachycardia SP AICD  History of pulmonary embolus (PE) - on Eliquis- (present on admission) On apixaban for anticoagulation with good toleration    Hyperlipidemia with target LDL less than 70- (present on admission) On atorvastatin and ezetimibe      Subjective: Patient with no dyspnea or chest pain, his lower extremity edema and scrotal edema are improving, this am patient has been hypotensive but not symptomatic  Objective BP 92/65    Pulse 62    Temp 98 F (36.7 C) (Oral)    Resp 19    Ht 5\' 7"  (1.702 m)  Wt 85.1 kg    SpO2 98%    BMI 29.40 kg/m   Neurology  awake and alert ENT no pallor or icterus  Cardiovascular heart with S1 and S2 present with no gallops, positive 3.6 systolic murmur at the apex Lower extremity with + edema non  pitting Scrotal edema improving but not back to baseline.  Pulmonary  with no rales or wheezing, no rhonchi  Abdominal soft and non tender Skin Musculoskeletal   Data Reviewed:    Family Communication: no family at the bedside   Disposition: Status is: Inpatient  Remains inpatient appropriate because: heart failure management.           Author: Tawni Millers, MD 08/11/2021 2:58 PM  For on call review [email or web address].

## 2021-08-11 NOTE — Plan of Care (Signed)
  Problem: Education: Goal: Ability to demonstrate management of disease process will improve Outcome: Progressing Goal: Individualized Educational Video(s) Outcome: Progressing   Problem: Activity: Goal: Capacity to carry out activities will improve Outcome: Progressing   

## 2021-08-11 NOTE — Progress Notes (Addendum)
Patient SBPs trending in 83-89 while sitting in the chair during RN shift assessment. Patient express no chest pain or dizzy spells. RN obtained a manual BP of 88/72. Paged and notified Arrien MD. Verbal order to hold AM dose of 40mg  IV lasix and to give the rest of the AM meds. RN set BP to obtain q1 hour to monitor for any changes. RN educated patient to notify RN if there are any changes such as chest pain or dizzy spells prior to ambulation. Patient express understanding, call bell is within reach, patient sitting in chair.

## 2021-08-11 NOTE — Progress Notes (Signed)
NT notified RN that patient BP at 1600 was 77/48 on the patient left arm, automatic. RN rounded on the patient and obtained an automatic BP on patient's right arm, BP is 94/72. Patient lying in bed with both BP measurement. Patient states he feels weird but no pain or dizziness. NT obtained CBG and CBG is 100. Patient requested one ice cream cup if able. RN gave one ice cream and educated the patient to notify RN or NT if any chest discomfort or dizziness arise. MD paged to notify.

## 2021-08-12 DIAGNOSIS — N179 Acute kidney failure, unspecified: Secondary | ICD-10-CM | POA: Diagnosis not present

## 2021-08-12 DIAGNOSIS — I5023 Acute on chronic systolic (congestive) heart failure: Secondary | ICD-10-CM | POA: Diagnosis not present

## 2021-08-12 DIAGNOSIS — Z8673 Personal history of transient ischemic attack (TIA), and cerebral infarction without residual deficits: Secondary | ICD-10-CM | POA: Diagnosis not present

## 2021-08-12 DIAGNOSIS — I10 Essential (primary) hypertension: Secondary | ICD-10-CM | POA: Diagnosis not present

## 2021-08-12 LAB — BASIC METABOLIC PANEL
Anion gap: 9 (ref 5–15)
BUN: 37 mg/dL — ABNORMAL HIGH (ref 8–23)
CO2: 27 mmol/L (ref 22–32)
Calcium: 8.6 mg/dL — ABNORMAL LOW (ref 8.9–10.3)
Chloride: 97 mmol/L — ABNORMAL LOW (ref 98–111)
Creatinine, Ser: 2.15 mg/dL — ABNORMAL HIGH (ref 0.61–1.24)
GFR, Estimated: 33 mL/min — ABNORMAL LOW (ref >60)
Glucose, Bld: 101 mg/dL — ABNORMAL HIGH (ref 70–99)
Potassium: 4.5 mmol/L (ref 3.5–5.1)
Sodium: 133 mmol/L — ABNORMAL LOW (ref 135–145)

## 2021-08-12 MED ORDER — TRIMETHOBENZAMIDE HCL 100 MG/ML IM SOLN
200.0000 mg | Freq: Three times a day (TID) | INTRAMUSCULAR | Status: DC | PRN
  Administered 2021-08-12: 200 mg via INTRAMUSCULAR
  Filled 2021-08-12 (×2): qty 2

## 2021-08-12 MED ORDER — TRAZODONE HCL 50 MG PO TABS
50.0000 mg | ORAL_TABLET | Freq: Every evening | ORAL | Status: DC | PRN
  Administered 2021-08-12 – 2021-08-16 (×4): 50 mg via ORAL
  Filled 2021-08-12 (×5): qty 1

## 2021-08-12 MED ORDER — ONDANSETRON HCL 4 MG/2ML IJ SOLN
4.0000 mg | Freq: Four times a day (QID) | INTRAMUSCULAR | Status: DC | PRN
  Administered 2021-08-12: 4 mg via INTRAVENOUS
  Filled 2021-08-12 (×2): qty 2

## 2021-08-12 NOTE — Progress Notes (Signed)
Pt. Requesting med for nausea. Pt given IV Zofran at 1905.  States he has been asking for med and hasn't received anything. RN informed pt. That per [facility] med was given at 1905 and that RN had to be in his room to in order to scan med and arm band. Pt. States he doesn't remember. On call for [facility] paged to make aware. Pts systolic lBP ow and HR 58. New VS obtained and charted. Pt. Stable and resting in bed. New orders received.

## 2021-08-12 NOTE — Progress Notes (Signed)
Notified by RN that pt complaining of nausea and vomiting. Day nurse reported to night shift that pt has been vomiting from 6 pm -7 pm. Was given zofran around 7:30 pm.  Pt states still vomiting and wants pepto bismal.  Reviewed chart and see that pt has prolonged QT interval of over 640 on last EKG.   D/C zofran so as not to prolong QT further or cause Torsades.  Given Tigan for nausea now.  BP has been soft all day with SBP of 80-90. RN reports pt given metoprolol earlier today but has not had BP checked since 3:30 pm today.   Asked to get BP now to monitor.

## 2021-08-12 NOTE — Progress Notes (Signed)
Pt reports feeling "wiped out".  Pressures remain low, R arm: 67/52(59); L arm 79/57(65).  Dr. Cathlean Sauer notified.  No new orders at this time. Pt in no acute distress, resting comfortably in bed.

## 2021-08-12 NOTE — Plan of Care (Signed)
?  Problem: Activity: ?Goal: Capacity to carry out activities will improve ?Outcome: Progressing ?  ?Problem: Clinical Measurements: ?Goal: Respiratory complications will improve ?Outcome: Progressing ?  ?Problem: Elimination: ?Goal: Will not experience complications related to urinary retention ?Outcome: Progressing ?  ?Problem: Safety: ?Goal: Ability to remain free from injury will improve ?Outcome: Progressing ?  ?

## 2021-08-12 NOTE — Progress Notes (Signed)
Pt had 15-20 min episode of vomiting s/p dinner this evening.  Order for IV zofran obtained and administered.  According to pt, he's been experiencing episodes of n/v "several days" over the course of the past couple weeks.  Only able to eat a boiled egg or a banana in a whole day.  He reports passing gas and having small BMs.  Abdomen is soft and nontender.  Dr. Cathlean Sauer notified of pt's symptoms.  No new orders at this time. Pt status also reported to night RN

## 2021-08-12 NOTE — Progress Notes (Signed)
BP in R arm 77/62(69). L arm 83/64(72).  Dr. Cathlean Sauer made aware and approved for all scheduled a.m. meds to be administered.

## 2021-08-12 NOTE — Progress Notes (Signed)
Progress Note   Patient: [name] NKN:[id number] DOB: [birth year] DOA: 08/09/2021     2 DOS: the patient was seen and examined on 08/12/2021   Brief hospital course: [name] was admitted to the hospital with the working diagnosis of decompensated heart failure.   66 yo male with the past medical history of CVA with right hemiparesis, CKD stage 3a, dyslipidemia, heart failure, thoracic aortic aneurysm, pulmonary embolism, HTN, and V tach sp AICD, who presented with dyspnea and edema. Reported worsening dyspnea for the last 2 to 3 days leading to his hospitalization, associated with significant lower extremity and scrotal edema. He has been not compliant with his medications at home. On his initial physical examination his blood pressure was 125/95, HR 93, RR 22, Oxygen saturation 95% on supplemental 02 per [place]. His lungs had no rales, or wheezing, heart with S1 and S2 present, with no gallops or murmurs, abdomen with no distention, positive lower extremity edema.   Na 134, K 4,9, cl 102, bicarb 20, glucose 111, bun 47 and cr 1,87  High sensitive troponin 31 and 32 Wbc 10,5, hgb 12,0, hct 41,6 and plt 282 SARS COVID 19 negative  Chest radiograph with cardiomegaly and vascular hilar congestion bilaterally   EKG 92 bpm, normal axis, normal intervals, sinus rhythm, with q wave lead II, III, AVF, V4 to V6 with no significant ST segment or T wave changes.   Patient has diuresed about 4 L since admission with improvement in edema of lower extremities.  He has developed hypotension limiting further diuresis.  If blood pressure does not improve and recurrent edema, or signs of hypoperfusion will need inotropic support.   Assessment and Plan * Acute on chronic systolic CHF (congestive heart failure) (HCC) Echocardiogram from 09/22 with EF 20 to 25% with severe hypokinesis and akinesis of the entire lateral wall. RV systolic function was reduced moderately.  Echocardiogram this admission with  poor windows.   Ischemic cardiomyopathy.  Patient has been hypotensive with MAP between 59 and 77. Patient had a bolus NS last night for hypotension.  His volume status has improved, including scrotal edema.  His urine output has been 450 over last 24 hrs and net negative fluid balance has been 4,894 since admission.   Patient with no signs of hypoperfusion, warm lower extremities and mentation is normal.  Serum cr has been elevated at 1.91  Plan to continue heart failure management with dapagliflozin, will hold on spironolactone and metoprolol succinate. Continue holding on entresto to prevent further hypotension.  Continue to hold on furosemide for today, if worsening hypotension and recurrent edema, may need inotropic support.   Essential hypertension- (present on admission) Blood pressure has been low.  Will stop metoprolol and spironolactone Continue to hold on Entresto.   Close blood pressure monitoring   Acute renal failure superimposed on stage 3a chronic kidney disease (HCC) Hyponatremia and hypokalemia Follow up renal function today, furosemide has been on hold due to hypotension.     Status post CVA Continue with statin therapy and anticoagulation with apixaban  Pt and OT  PAF (paroxysmal atrial fibrillation) ([place])- (present on admission) Continue with amiodarone. For now will hold on metoprolol due to hypotension.  Anticoagulation with apixaban Continue telemetry monitoring   CAD, multiple vessel -> severe native vessel CAD-CABG 2014- (present on admission) No chest pain, continue with clopidogrel and statin therapy   History of ventricular tachycardia SP AICD  History of pulmonary embolus (PE) - on Eliquis- (present on admission) Continue with  apixaban for anticoagulation with good toleration    Hyperlipidemia with target LDL less than 70- (present on admission) Continue with atorvastatin and ezetimibe      Subjective: Patient is feeling weak, but no  dyspnea or chest pain, denies any orthostatic symptoms, no dizziness or lightheadedness   Objective BP (!) 79/57 (BP Location: Left Arm)    Pulse 61    Temp 97.8 F (36.6 C) (Oral)    Resp 17    Ht 5\' 7"  (1.702 m)    Wt 86.8 kg    SpO2 96%    BMI 29.97 kg/m   Neurology awake and alert  ENT no pallor or icterus  Cardiovascular heart with S1 and S2 present and rhythmic, no rubs or gallops, positive systolic murmur 3/6 at the apex. No JVD No lower extremity edema Positive scrotal edema.   Pulmonary  with no wheezing or rhonchi no significant rales  Abdominal soft and non tender Skin Musculoskeletal   Data Reviewed:    Family Communication: no family at the bedside   Disposition: Status is: Inpatient  Remains inpatient appropriate because: hypotension            Author: Tawni Millers, MD 08/12/2021 4:29 PM  For on call review [email or web address].

## 2021-08-13 ENCOUNTER — Inpatient Hospital Stay: Payer: Self-pay

## 2021-08-13 DIAGNOSIS — I255 Ischemic cardiomyopathy: Secondary | ICD-10-CM | POA: Diagnosis not present

## 2021-08-13 DIAGNOSIS — I5082 Biventricular heart failure: Secondary | ICD-10-CM

## 2021-08-13 DIAGNOSIS — Z515 Encounter for palliative care: Secondary | ICD-10-CM

## 2021-08-13 DIAGNOSIS — I10 Essential (primary) hypertension: Secondary | ICD-10-CM | POA: Diagnosis not present

## 2021-08-13 DIAGNOSIS — Z7189 Other specified counseling: Secondary | ICD-10-CM

## 2021-08-13 DIAGNOSIS — N179 Acute kidney failure, unspecified: Secondary | ICD-10-CM | POA: Diagnosis not present

## 2021-08-13 DIAGNOSIS — I5023 Acute on chronic systolic (congestive) heart failure: Secondary | ICD-10-CM | POA: Diagnosis not present

## 2021-08-13 DIAGNOSIS — Z8673 Personal history of transient ischemic attack (TIA), and cerebral infarction without residual deficits: Secondary | ICD-10-CM | POA: Diagnosis not present

## 2021-08-13 LAB — BASIC METABOLIC PANEL
Anion gap: 9 (ref 5–15)
BUN: 42 mg/dL — ABNORMAL HIGH (ref 8–23)
CO2: 26 mmol/L (ref 22–32)
Calcium: 8.7 mg/dL — ABNORMAL LOW (ref 8.9–10.3)
Chloride: 99 mmol/L (ref 98–111)
Creatinine, Ser: 2.35 mg/dL — ABNORMAL HIGH (ref 0.61–1.24)
GFR, Estimated: 30 mL/min — ABNORMAL LOW (ref >60)
Glucose, Bld: 94 mg/dL (ref 70–99)
Potassium: 5 mmol/L (ref 3.5–5.1)
Sodium: 134 mmol/L — ABNORMAL LOW (ref 135–145)

## 2021-08-13 LAB — MAGNESIUM: Magnesium: 2.1 mg/dL (ref 1.7–2.4)

## 2021-08-13 LAB — COOXEMETRY PANEL
Carboxyhemoglobin: 1.6 % — ABNORMAL HIGH (ref 0.5–1.5)
Methemoglobin: 0.8 % (ref 0.0–1.5)
O2 Saturation: 69.5 %
Total hemoglobin: 12.8 g/dL (ref 12.0–16.0)

## 2021-08-13 MED ORDER — SODIUM CHLORIDE 0.9% FLUSH: 10.0000 mL | INTRAVENOUS | Status: DC | PRN

## 2021-08-13 MED ORDER — CHLORHEXIDINE GLUCONATE CLOTH 2 % EX PADS
6.0000 | MEDICATED_PAD | Freq: Every day | CUTANEOUS | Status: DC
  Administered 2021-08-13 – 2021-08-21 (×8): 6 via TOPICAL

## 2021-08-13 MED ORDER — FUROSEMIDE 10 MG/ML IJ SOLN
40.0000 mg | Freq: Once | INTRAMUSCULAR | Status: AC
  Administered 2021-08-13: 40 mg via INTRAVENOUS
  Filled 2021-08-13: qty 4

## 2021-08-13 MED ORDER — SODIUM CHLORIDE 0.9% FLUSH
10.0000 mL | Freq: Two times a day (BID) | INTRAVENOUS | Status: DC
  Administered 2021-08-13 – 2021-08-17 (×5): 10 mL
  Administered 2021-08-17: 20 mL
  Administered 2021-08-18 – 2021-08-21 (×6): 10 mL

## 2021-08-13 MED ORDER — DOBUTAMINE IN D5W 4-5 MG/ML-% IV SOLN
2.5000 ug/kg/min | INTRAVENOUS | Status: DC
  Administered 2021-08-13 – 2021-08-16 (×2): 2.5 ug/kg/min via INTRAVENOUS
  Filled 2021-08-13 (×2): qty 250

## 2021-08-13 MED ORDER — FUROSEMIDE 10 MG/ML IJ SOLN
80.0000 mg | Freq: Two times a day (BID) | INTRAMUSCULAR | Status: DC
  Administered 2021-08-13 – 2021-08-17 (×8): 80 mg via INTRAVENOUS
  Filled 2021-08-13 (×8): qty 8

## 2021-08-13 NOTE — Assessment & Plan Note (Addendum)
CODE STATUS changed back to full code, discussion with Dr. Cathlean Sauer

## 2021-08-13 NOTE — Consult Note (Addendum)
Advanced Heart Failure Team Consult Note   Primary Physician: Hamrick, Lorin Mercy, MD PCP-Cardiologist:  Sanda Klein, MD  Reason for Consultation: Acute on chronic systolic CHF  HPI:    [name] is seen today for evaluation of acute on chronic systolic CHF at the request of Dr. Cathlean Sauer with [facility]. 66 y.o. male with history of CAD s/p CABG in 2014 (LIMA to LAD, SVG to RCA, SVG to OM2) followed by inferior STEMI 09/20 s/p DES SVG to RCA anastomosis and STEMI 10/21 s/p POBA SVG to PDA, hx VT arrest (suspected scar mediated) s/p dual-chamber ICD, chronic systolic CHF/ICM, hx CVA, hx bilateral PE and b/l DVT, hx possible LV thrombus.  Admitted 11/21 with VT that converted to asystole. Required external pacing. Cardiac cath with stable CAD. EF 30-35%. Seen by AHF service at that time. Underwent dual chamber ICD during admit.   Admitted 09/22 with inferoposterior STEMI. Emergent cath showed thrombotic occlusion of SVG to OM1. SVG to RCA patent, unable to assess LIMA to LAD. He was treated with Aggrastat followed by aspirin, plavix and eliquis. EF 20-25% on echo that admit.   He was admitted on 08/09/21 with a/c CHF. Reports decreased appetite, decline in activity tolerance d/t generalized fatigue, LE edema and scrotal edema prior to admit. Attempted to diurese with IV lasix but developed hypotension. Entresto, spiro and metoprolol held. Continued on SGLT2i. Scr has trended up from 1.45 a few months ago, 1.87 on admit >> 2.35.  Patient reports LE edema and scrotal edema have improved. No orthopnea or PND. Appetite better. Has been up walking.  Reports noncompliance with medications prior to admit.  He is a retired Administrator. Lives at home with his wife near [place]. Has a daughter.   Review of Systems: [y] = yes, [ ]  = no   General: Weight gain [Y]; Weight loss [ ] ; Anorexia [ ] ; Fatigue [Y]; Fever [ ] ; Chills [ ] ; Weakness [ ]   Cardiac: Chest pain/pressure [ ] ; Resting SOB [ ] ;  Exertional SOB [ ] ; Orthopnea [ ] ; Pedal Edema [Y]; Palpitations [ ] ; Syncope [ ] ; Presyncope [ ] ; Paroxysmal nocturnal dyspnea[ ]   Pulmonary: Cough [ ] ; Wheezing[ ] ; Hemoptysis[ ] ; Sputum [ ] ; Snoring [ ]   GI: Vomiting[ ] ; Dysphagia[ ] ; Melena[ ] ; Hematochezia [ ] ; Heartburn[ ] ; Abdominal pain [ ] ; Constipation [ ] ; Diarrhea [ ] ; BRBPR [ ]   GU: Hematuria[ ] ; Dysuria [ ] ; Nocturia[ ]   Vascular: Pain in legs with walking [ ] ; Pain in feet with lying flat [ ] ; Non-healing sores [ ] ; Stroke [ ] ; TIA [ ] ; Slurred speech [ ] ;  Neuro: Headaches[ ] ; Vertigo[ ] ; Seizures[ ] ; Paresthesias[ ] ;Blurred vision [ ] ; Diplopia [ ] ; Vision changes [ ]   Ortho/Skin: Arthritis [ ] ; Joint pain [ ] ; Muscle pain [ ] ; Joint swelling [ ] ; Back Pain [ ] ; Rash [ ]   Psych: Depression[ ] ; Anxiety[ ]   Heme: Bleeding problems [ ] ; Clotting disorders [ ] ; Anemia [ ]   Endocrine: Diabetes [ ] ; Thyroid dysfunction[ ]   Home Medications Prior to Admission medications   Medication Sig Start Date End Date Taking? Authorizing Provider  apixaban (ELIQUIS) 5 MG TABS tablet Take 1 tablet (5 mg total) by mouth 2 (two) times daily. 09/06/20  Yes Croitoru, Mihai, MD  atorvastatin (LIPITOR) 80 MG tablet TAKE 1 TABLET BY MOUTH DAILY AT 6:00 PM 04/19/21  Yes Croitoru, Mihai, MD  clopidogrel (PLAVIX) 75 MG tablet Take 1 tablet (75 mg total) by mouth  daily. 04/16/21  Yes Duke, Tami Lin, PA  dapagliflozin propanediol (FARXIGA) 10 MG TABS tablet Take 1 tablet (10 mg total) by mouth daily. 04/04/21  Yes Bhagat, Bhavinkumar, PA  ENTRESTO 24-26 MG TAKE 1 TABLET BY MOUTH 2 TIMES DAILY. 08/07/21  Yes Croitoru, Mihai, MD  escitalopram (LEXAPRO) 10 MG tablet Take 1 tablet (10 mg total) by mouth daily. 06/29/20  Yes Angiulli, Lavon Paganini, PA-C  ezetimibe (ZETIA) 10 MG tablet Take 10 mg by mouth daily.   Yes [provider]  Multiple Vitamins-Minerals (ONE-A-DAY MENS 50+ ADVANTAGE) TABS Take 1 tablet by mouth daily with breakfast.   Yes [provider]  nystatin (MYCOSTATIN/NYSTOP) powder APPLY TOPICALLY TO THE AFFECTED AREA 2 TIMES A DAY FOR 2 WEEKS Patient taking differently: 1 application 2 (two) times daily as needed (rash). 04/16/21  Yes Duke, Tami Lin, PA  spironolactone (ALDACTONE) 25 MG tablet Take 1/2 tablet (12.5 mg total) by mouth daily. 04/04/21  Yes Bhagat, Bhavinkumar, PA  amiodarone (PACERONE) 200 MG tablet TAKE 1 TABLET BY MOUTH 2 TIMES DAILY. Patient not taking: Reported on 04/16/2021 03/05/21   Croitoru, Dani Gobble, MD  ezetimibe (ZETIA) 10 MG tablet Take 1 tablet (10 mg total) by mouth daily. 04/16/21 07/15/21  Ledora Bottcher, PA  metoprolol succinate (TOPROL-XL) 25 MG 24 hr tablet Take 1 tablet (25 mg total) by mouth daily. Patient not taking: Reported on 08/09/2021 04/03/21   Leanor Kail, PA  nitroGLYCERIN (NITROSTAT) 0.4 MG SL tablet Place 1 tablet (0.4 mg total) under the tongue every 5 (five) minutes x 3 doses as needed for chest pain. 06/29/20   Angiulli, Lavon Paganini, PA-C    Past Medical History: Past Medical History:  Diagnosis Date   Acute pulmonary embolism without acute cor pulmonale (HCC)    PE-DVT during hospitalization Nov-Dec 2021 On Eliquis at discharge   Acute ST elevation myocardial infarction (STEMI) involving other coronary artery of inferior wall ([place]) 03/31/2021   Acute ST elevation myocardial infarction (STEMI) of inferior wall ([place]) 03/31/2021   Borderline diabetic    Cardiogenic shock (HCC)    Coronary artery disease    a. s/p CABG in 2014 with LIMA-LAD, SVG-OM2 and SVG-RC b. 03/2019: STEMI and s/p angioplasty to proximal SVG-RCA and DES to SVG-RCA anastomosis c. 04/2020: recurrent STEMI with angioplasty of SVG-RCA   Dyslipidemia 01/11/2013   Hiatal hernia    Hypertension    Mini stroke    2   Respiratory failure with hypoxia (HCC)    S/P CABG x 3    Sleep disturbance    ST elevation myocardial infarction (STEMI) ([place]) 09/01/2012   Pt presented 03/23/2019 with inferior STEMI  complicated by respiratory failure- intubation   STEMI (ST elevation myocardial infarction) ([place])    Stroke ([place])    Transaminitis     Past Surgical History: Past Surgical History:  Procedure Laterality Date   CARDIAC CATHETERIZATION     CORONARY ARTERY BYPASS GRAFT N/A 09/01/2012   Procedure: CORONARY ARTERY BYPASS GRAFTING (CABG);  Surgeon: Gaye Pollack, MD;  Location: [facility];  Service: Open Heart Surgery;  Laterality: N/A;  Coronary Artery Bypass Grafting Times Three Using Left Internal Mammary Artery and Left Saphenous leg Vein Harvested Endoscopically   CORONARY STENT INTERVENTION N/A 03/23/2019   Procedure: CORONARY STENT INTERVENTION;  Surgeon: Lorretta Harp, MD;  Location: [place] [facility];  Service: Cardiovascular;  Laterality: N/A;   CORONARY/GRAFT ACUTE MI REVASCULARIZATION N/A 03/23/2019   Procedure: Coronary/Graft Acute MI Revascularization;  Surgeon: Gwenlyn Found,  Pearletha Forge, MD;  Location: [facility];  Service: Cardiovascular;  Laterality: N/A;   CORONARY/GRAFT ACUTE MI REVASCULARIZATION N/A 05/03/2020   Procedure: Coronary/Graft Acute MI Revascularization;  Surgeon: Belva Crome, MD;  Location: [place];  Service: Cardiovascular;  Laterality: N/A;   IABP INSERTION N/A 06/04/2020   Procedure: IABP Insertion;  Surgeon: Martinique, Peter M, MD;  Location: [facility];  Service: Cardiovascular;  Laterality: N/A;   ICD IMPLANT N/A 06/19/2020   Procedure: ICD IMPLANT;  Surgeon: Vickie Epley, MD;  Location: [facility];  Service: Cardiovascular;  Laterality: N/A;   INTRAOPERATIVE TRANSESOPHAGEAL ECHOCARDIOGRAM N/A 09/01/2012   Procedure: INTRAOPERATIVE TRANSESOPHAGEAL ECHOCARDIOGRAM;  Surgeon: Gaye Pollack, MD;  Location: [facility];  Service: Open Heart Surgery;  Laterality: N/A;   LEFT HEART CATH AND CORONARY ANGIOGRAPHY N/A 03/23/2019   Procedure: LEFT HEART CATH AND CORONARY ANGIOGRAPHY;  Surgeon: Lorretta Harp, MD;  Location: [facility];   Service: Cardiovascular;  Laterality: N/A;   LEFT HEART CATH AND CORONARY ANGIOGRAPHY N/A 05/03/2020   Procedure: LEFT HEART CATH AND CORONARY ANGIOGRAPHY;  Surgeon: Belva Crome, MD;  Location: [facility];  Service: Cardiovascular;  Laterality: N/A;   LEFT HEART CATH AND CORS/GRAFTS ANGIOGRAPHY N/A 03/31/2021   Procedure: LEFT HEART CATH AND CORS/GRAFTS ANGIOGRAPHY;  Surgeon: Leonie Man, MD;  Location: [facility];  Service: Cardiovascular;  Laterality: N/A;   LEFT HEART CATHETERIZATION WITH CORONARY ANGIOGRAM N/A 08/31/2012   Procedure: LEFT HEART CATHETERIZATION WITH CORONARY ANGIOGRAM;  Surgeon: Peter M Martinique, MD;  Location: [facility];  Service: Cardiovascular;  Laterality: N/A;   PERCUTANEOUS CORONARY STENT INTERVENTION (PCI-S) N/A 08/31/2012   Procedure: PERCUTANEOUS CORONARY STENT INTERVENTION (PCI-S);  Surgeon: Peter M Martinique, MD;  Location: [facility] CATH LAB;  Service: Cardiovascular;  Laterality: N/A;   RIGHT/LEFT HEART CATH AND CORONARY/GRAFT ANGIOGRAPHY N/A 06/04/2020   Procedure: RIGHT/LEFT HEART CATH AND CORONARY/GRAFT ANGIOGRAPHY;  Surgeon: Martinique, Peter M, MD;  Location: [place] [facility];  Service: Cardiovascular;  Laterality: N/A;   TEMPORARY PACEMAKER N/A 06/04/2020   Procedure: TEMPORARY PACEMAKER;  Surgeon: Martinique, Peter M, MD;  Location: [place];  Service: Cardiovascular;  Laterality: N/A;    Family History: Family History  Problem Relation Age of Onset   Cancer Father    Hypertension Father    Hyperlipidemia Father    Heart attack Father    Hypertension Sister    Hypertension Brother    Hyperlipidemia Brother    Heart attack Sister    Heart attack Brother     Social History: Social History   Socioeconomic History   Marital status: Married    Spouse name: [name]   Number of children: 2   Years of education: Not on file   Highest education level: 11th grade  Occupational History   Occupation: retired    Comment: large Psychologist, occupational  Tobacco Use   Smoking status: Former    Types: Pipe   Smokeless tobacco: Never  Scientific laboratory technician Use: Never used  Substance and Sexual Activity   Alcohol use: No   Drug use: No   Sexual activity: Not Currently  Other Topics Concern   Not on file  Social History Narrative   Not on file   Social Determinants of Health   Financial Resource Strain: Medium Risk   Difficulty of Paying Living Expenses: Somewhat hard  Food Insecurity: No Food Insecurity  Worried About Charity fundraiser in the Last Year: Never true   [place] in the Last Year: Never true  Transportation Needs: No Transportation Needs   Lack of Transportation (Medical): No   Lack of Transportation (Non-Medical): No  Physical Activity: Not on file  Stress: Not on file  Social Connections: Not on file    Allergies:  No Known Allergies  Objective:    Vital Signs:   Temp:  [97.5 F (36.4 C)-97.8 F (36.6 C)] 97.6 F (36.4 C) (01/30 0419) Pulse Rate:  [56-64] 60 (01/30 0419) Resp:  [20] 20 (01/30 0419) BP: (67-97)/(52-78) 97/73 (01/30 0419) SpO2:  [96 %-100 %] 100 % (01/30 0419) Weight:  [86.9 kg] 86.9 kg (01/30 0419) Last BM Date: 08/11/21  Weight change: Filed Weights   08/11/21 0340 08/12/21 0400 08/13/21 0419  Weight: 85.1 kg 86.8 kg 86.9 kg    Intake/Output:   Intake/Output Summary (Last 24 hours) at 08/13/2021 1103 Last data filed at 08/13/2021 0424 Gross per 24 hour  Intake 714 ml  Output 325 ml  Net 389 ml      Physical Exam    General:  Chronically ill appearing male. No distress. HEENT: poor dentition Neck: supple. JVP to ear. Carotids 2+ bilat; no bruits.  Cor: PMI nondisplaced. Regular rate & rhythm. No rubs, gallops or murmurs. Lungs: bibasilar crackles Abdomen: soft, nontender, nondistended. No hepatosplenomegaly.  Extremities: no cyanosis, clubbing, rash, edema Neuro: alert & orientedx3, cranial nerves grossly intact. moves all 4 extremities w/o  difficulty. Affect pleasant   Telemetry   SR 50s-60s, intermittently Vpaced   EKG    01/29: SR 60s, inferior Qs  Labs   Basic Metabolic Panel: Recent Labs  Lab 08/09/21 1645 08/10/21 0218 08/10/21 0219 08/11/21 0316 08/12/21 1632 08/13/21 0353  NA 134*  --  134* 138 133* 134*  K 4.9  --  3.6 3.8 4.5 5.0  CL 102  --  100 102 97* 99  CO2 20*  --  22 28 27 26   GLUCOSE 111*  --  105* 93 101* 94  BUN 47*  --  44* 36* 37* 42*  CREATININE 1.87*  --  1.82* 1.91* 2.15* 2.35*  CALCIUM 9.0  --  8.7* 8.1* 8.6* 8.7*  MG  --  2.0  --  2.1  --  2.1    Liver Function Tests: No results for input(s): AST, ALT, ALKPHOS, BILITOT, PROT, ALBUMIN in the last 168 hours. No results for input(s): LIPASE, AMYLASE in the last 168 hours. No results for input(s): AMMONIA in the last 168 hours.  CBC: Recent Labs  Lab 08/09/21 1645 08/10/21 0219  WBC 10.5 10.8*  HGB 12.9* 13.1  HCT 41.6 40.5  MCV 81.1 80.7  PLT 282 262    Cardiac Enzymes: No results for input(s): CKTOTAL, CKMB, CKMBINDEX, TROPONINI in the last 168 hours.  BNP: BNP (last 3 results) Recent Labs    08/10/21 0218  BNP 2,604.2*    ProBNP (last 3 results) No results for input(s): PROBNP in the last 8760 hours.   CBG: Recent Labs  Lab 08/11/21 0635 08/11/21 1608  GLUCAP 101* 100*    Coagulation Studies: No results for input(s): LABPROT, INR in the last 72 hours.   Imaging   No results found.   Medications:     Current Medications:  amiodarone  200 mg Oral BID   apixaban  5 mg Oral BID   atorvastatin  80 mg Oral Daily   clopidogrel  75 mg Oral Daily   dapagliflozin propanediol  10 mg Oral Daily   escitalopram  10 mg Oral Daily   ezetimibe  10 mg Oral Daily   sodium chloride flush  3 mL Intravenous Q12H    Infusions:     Patient Profile   66 yo male with history of CAD s/p CABG and multiple prior MIs, ICM/chronic systolic CHF s/p ICD, hx VT, hx bilateral PE/DVT, hx CVA, poss LV thrombus.  Admitted with acute on chronic systolic CHF  Assessment/Plan   Acute on chronic systolic CHF with biventricular failure/ICM: - Echo 05/04/20 EF 25%. Moderate RV to severe RV dysfunction and possible LV thrombus.  - [place] 10/21 with preserved output and well compensated filling pressures.  - Echo 11/21 EF 25-30%,  - Admit with a/c CHF - has not been totally adherent with medical therapy - Echo 08/10/21: Technically difficult, unable to accurately assess EF, moderate LVH, RV moderately reduced, mild MR, dilated IVC - Repeat echo  - concerned about low-output HF. Developed hypotension and worsening renal function with attempts to diurese with IV lasix. Still appears volume up. - Discussed RHC to assess filling pressures and CO. I am concerned he may need inotrope support. At this point, he feels ready to go home and does not want to pursue any further workup.  - Interrogate ICD. Has not had device interrogation since 12/21. Prior concern burden of RV pacing may lead to worsening LV function. - continue Farxiga - Off entresto and spiro with AKI and hypotension - Off beta blocker with a/c CHF - Would benefit from Palliative Consult for [place] discussion.   2. CAD: - s/p CABG 2014 - Inferior STEMI 09/20 s/p DES SVG to RCA anastomosis - Inferior STEMI 05/03/20 s/p POBA SVG->PDA - Inferoposterior STEMI 09/22 >> thrombotic occlusion SVG to OM1 - On plavix. No ASA d/t anticoagulation.  - On atorvastatin 80 - Denies CP  3. Hx VT arrest: - S/p dual chamber ICD placement - On po amio 200 BID - No VT on tele   4. Hx Bilateral PE/b/l DVT: - Anticoagulated with Eliquis  5. Possible LV thrombus: - Noted on echo 10/21, TEE 11/21 with no thrombus  6. AKI on CKD IIIb: - Baseline Scr 1.4, 1.87 on admit and now up to 2.35 - concern may be d/t low output HF +/- episodes of hypotension   Length of Stay: 3  FINCH, LINDSAY N, PA-C  08/13/2021, 11:03 AM  Advanced Heart Failure Team Pager [phone]  (M-F; 7a - 5p)  Please contact [facility] for night-coverage after hours (4p -7a ) and weekends on [email or web address]   Patient seen and examined with the above-signed Advanced Practice Provider and/or Housestaff. I personally reviewed laboratory data, imaging studies and relevant notes. I independently examined the patient and formulated the important aspects of the plan. I have edited the note to reflect any of my changes or salient points. I have personally discussed the plan with the patient and/or family.  66 y/o male with severe CAD s/p CABG 2014 with multiple subsequent MIs/PCIs., systolic HF due to iCM EF 20-25%, CKD 3B admitted with a/c systolic dysfunction with marked volume overload.   Echo EF 20-25%Moderate RV dysfunction   Has diuresed just a few pounds. Diuresis limited by low BP and AKI.   At baseline NYHA III-IIIb. Recently using a scooter at the grocery store  General:  Lying in bed No resp difficulty HEENT: normal Neck: supple. JVP to ear. Prominent v waves Carotids 2+  bilat; no bruits. No lymphadenopathy or thryomegaly appreciated. Cor: PMI nondisplaced. Regular rate & rhythm. 3/6 MR Lungs: clear Abdomen: soft, nontender, nondistended. No hepatosplenomegaly. No bruits or masses. Good bowel sounds. Extremities: no cyanosis, clubbing, rash, edema Neuro: alert & orientedx3, cranial nerves grossly intact. moves all 4 extremities w/o difficulty. Affect pleasant  He has progressive HF symptoms in setting on advanced ischemic CM. Peripheral edema has improved but still with markedly elevated JVP and worsening renal failure. Suspect he has low output HF. Will place PICC and check co-ox CVP. With low BP will need DBA.   He is not overly interested in advanced therapies but might consider once he talks to his family.   Glori Bickers, MD  4:27 PM

## 2021-08-13 NOTE — Progress Notes (Signed)
Progress note:  After reviewing the patient's chart, I secure chatted with Dr. Cathlean Sauer regarding patient's current CODE STATUS and medical treatment wishes.  Dr. Cathlean Sauer provided clarification that patient would like to be a full code and proceed with advanced heart failure interventions including cardiac catheterization.  As per Dr. Nolon Lennert recommendation, no PMT consult needed at this time.   I will discontinue the consult but please reconsult PMT for any future palliative needs.  Ponca Ilsa Iha, FNP-BC Palliative Medicine Team Team Phone # [phone]  NO CHARGE

## 2021-08-13 NOTE — Progress Notes (Addendum)
Progress Note   Patient: [name] WUJ:[id number] DOB: 18-Aug-1955 DOA: 08/09/2021     3 DOS: the patient was seen and examined on 08/13/2021   Brief hospital course: Mr. Delapena was admitted to the hospital with the working diagnosis of decompensated heart failure.   66 yo male with the past medical history of CVA with right hemiparesis, CKD stage 3a, dyslipidemia, heart failure, thoracic aortic aneurysm, pulmonary embolism, HTN, and V tach sp AICD, who presented with dyspnea and edema. Reported worsening dyspnea for the last 2 to 3 days leading to his hospitalization, associated with significant lower extremity and scrotal edema. He has been not compliant with his medications at home. On his initial physical examination his blood pressure was 125/95, HR 93, RR 22, Oxygen saturation 95% on supplemental 02 per [place]. His lungs had no rales, or wheezing, heart with S1 and S2 present, with no gallops or murmurs, abdomen with no distention, positive lower extremity edema.   Na 134, K 4,9, cl 102, bicarb 20, glucose 111, bun 47 and cr 1,87  High sensitive troponin 31 and 32 Wbc 10,5, hgb 12,0, hct 41,6 and plt 282 SARS COVID 19 negative  Chest radiograph with cardiomegaly and vascular hilar congestion bilaterally   EKG 92 bpm, normal axis, normal intervals, sinus rhythm, with q wave lead II, III, AVF, V4 to V6 with no significant ST segment or T wave changes.   Patient has diuresed about 4 L since admission with improvement in edema of lower extremities.  He has developed hypotension limiting further diuresis.  Patient with signs of lowe cardiac output, will need cardiac catheterization and inotropic support.  He has declined advance heart failure interventions and his code status has been changes to DNR.  Poor prognosis.   Assessment and Plan * Acute on chronic systolic CHF (congestive heart failure) (HCC) Echocardiogram from 09/22 with EF 20 to 25% with severe hypokinesis and akinesis of  the entire lateral wall. RV systolic function was reduced moderately.  Echocardiogram this admission with poor windows.  Ischemic cardiomyopathy.   His blood pressure continue to be low, systolic this am is 95/62 Continue worsening renal function.   After load reduction, Entresto, spironolactone and metoprolol on hold due to risk of further hypotension. Likely patient in low output state.  Continue with dapagliflozin  Case discussed with heart failure team, plan for cardiac catheterization and possible inotropic therapy.  He has declined this intervention. I spoke with him and explained the potential benefits of this intervention. I also called his wife and explained her the situation.   Will resume diuresis with furosemide IV for now.       Essential hypertension- (present on admission) Likely patient in a low cardiac output state. Continue blood pressure monitoring. He has declined cardiac catheterization   Acute renal failure superimposed on stage 3a chronic kidney disease (HCC) Hyponatremia and hypokalemia Patient with worsening renal function with serum cr at 2,35 with K at 5,0 and serum bicarbonate at 26.  BUN is 42. Urine output over last 24 hrs documented at  700 cc  Plan to resume diuresis and follow up renal function in am, he will benefit from inotropic support.     Status post CVA On statin therapy and anticoagulation with apixaban  Pt and OT  PAF (paroxysmal atrial fibrillation) ([place])- (present on admission) Rate control with amiodarone and anticoagulation with apixaban. Continue to hold on metoprolol due to decompensated heart failure.   CAD, multiple vessel -> severe native vessel CAD-CABG  2014- (present on admission) No chest pain, patient on clopidogrel and statin therapy   History of ventricular tachycardia SP AICD  History of pulmonary embolus (PE) - on Eliquis- (present on admission) On apixaban for anticoagulation with good toleration     Hyperlipidemia with target LDL less than 70- (present on admission) On atorvastatin and ezetimibe   DNR (do not resuscitate) discussion I spoke with Mr. Sans about advance directives in case worsening condition, including cardiac and respiratory arrest. He has decided to change code status to DNR. He has lost his siblings and does not want aggressive measures, including CPR or mechanical ventilation. Will change code to DNR.      Subjective: Patient with no dyspnea, continue to have scrotal edema, no chest pain, very weak and deconditioned, he has nausea yesterday   Objective BP 91/73    Pulse 60    Temp 97.6 F (36.4 C) (Oral)    Resp 20    Ht 5\' 7"  (1.702 m)    Wt 86.9 kg    SpO2 100%    BMI 29.99 kg/m   Neurology awake and alert  ENT mild pallor but no icterus  Cardiovascular heart with S1 and S2 present and rhythmic, no gallops, positive murmur at the right sternal border Mild JVD Trace lower extremity edema  Pulmonary mild rales at bases but no wheezing  Abdominal soft and non tender Skin Musculoskeletal   Data Reviewed:    Family Communication: I spoke over the phone with the patient's wife about patient's  condition, plan of care, prognosis and all questions were addressed.   Disposition: Status is: Inpatient  Remains inpatient appropriate because: heart failure management.            Author: Tawni Millers, MD 08/13/2021 12:37 PM  For on call review [email or web address].

## 2021-08-13 NOTE — Progress Notes (Signed)
IV team gives clearance to draw lab from the R PICC. RN drew Co-Ox. Co-Ox sent to lab.

## 2021-08-13 NOTE — Progress Notes (Signed)
Patient Ok to proceed with advance heart failure interventions including cardiac catheterization CODE status changed back to full code.

## 2021-08-13 NOTE — Progress Notes (Addendum)
Heart Failure Navigator Progress Note  Assessed for Heart & Vascular TOC clinic readiness.  Patient does not meet criteria due to AHF rounding team consulted this hospitalization. Removed HV TOC clinic appt.  Navigator available for educational resources.    Pricilla Holm, MSN, RN Heart Failure Nurse Navigator [phone]

## 2021-08-13 NOTE — TOC Initial Note (Addendum)
Transition of Care [facility]) - Initial/Assessment Note    Patient Details  Name: [name] MRN: [id number] Date of Birth: 1956-06-17  Transition of Care [facility]) CM/SW Contact:    Erenest Rasher, RN Phone Number: [phone] 08/13/2021, 11:19 AM  Clinical Narrative:                 HF TOC CM spoke to pt at bedside. States his PCP, Dr [name], states he has been awhile since he has seen PCP. Lives at home with wife. He drives to his appts. Has a cane at home. Will continue to follow for dc needs.   Appt arranged with Dr. [name] on Aug 22, 2021 at 2 pm.   Expected Discharge Plan: Home/Self Care Barriers to Discharge: Continued Medical Work up   Patient Goals and CMS Choice Patient states their goals for this hospitalization and ongoing recovery are:: wants to return to baseline CMS Medicare.gov Compare Post Acute Care list provided to:: Patient Choice offered to / list presented to : Patient  Expected Discharge Plan and Services Expected Discharge Plan: Home/Self Care   Discharge Planning Services: CM Consult   Living arrangements for the past 2 months: Single Family Home   Prior Living Arrangements/Services Living arrangements for the past 2 months: Single Family Home Lives with:: Spouse Patient language and need for interpreter reviewed:: Yes        Need for Family Participation in Patient Care: No (Comment) Care giver support system in place?: No (comment) Current home services: DME (cane)    Activities of Daily Living      Permission Sought/Granted Permission sought to share information with : Case Manager, Family Supports, PCP Permission granted to share information with : Yes, Verbal Permission Granted  Share Information with NAME: Markee Matera     Permission granted to share info w Relationship: wife  Permission granted to share info w Contact Information: [phone]  Emotional Assessment Appearance:: Appears stated age Attitude/Demeanor/Rapport:  Engaged Affect (typically observed): Accepting Orientation: : Oriented to Self, Oriented to Place, Oriented to  Time, Oriented to Situation   Psych Involvement: No (comment)  Admission diagnosis:  Peripheral edema [R60.9] CHF exacerbation ([place]) [I50.9] Heart failure (HCC) [I50.9] Patient Active Problem List   Diagnosis Date Noted   Acute on chronic systolic CHF (congestive heart failure) ([place]) 08/09/2021   History of pulmonary embolus (PE) - on Eliquis 03/31/2021   PAF (paroxysmal atrial fibrillation) ([place]) 03/31/2021   NYHA class 1 heart failure with reduced ejection fraction (HCC)    Bradycardia    Acute renal failure superimposed on stage 3a chronic kidney disease (HCC)    Leukocytosis    Right hemiparesis ([place])    Debility 06/25/2020   Biventricular heart failure ([place])    Thoracic aortic aneurysm without rupture    Pressure injury of skin 06/21/2020   Cardiac defibrillator in situ    Cerebral infarction [facility])    History of ventricular tachycardia 06/04/2020   NSVT (nonsustained ventricular tachycardia) 05/26/2020   Chronic combined systolic and diastolic heart failure ([place]) 06/25/2019   Dilated aortic root ([place]) 06/25/2019   CAD S/P percutaneous coronary angioplasty 04/22/2019   Ischemic cardiomyopathy 04/22/2019   Aortic root enlargement ([place]) 04/22/2019   Stage 3a chronic kidney disease ([place]) 04/22/2019   Hyperkalemia 04/22/2019   Altered mental status 03/23/2019   CAD, multiple vessel -> severe native vessel CAD-CABG 2014 02/2013   Status post CVA 02/03/2013   Weakness 01/11/2013   Hyperlipidemia with target LDL less than  70 01/11/2013   Hx of CABG 09/02/2012   Essential hypertension    Prediabetes    Hiatal hernia    PCP:  Hamrick, Lorin Mercy, MD Pharmacy:   [facility] [address] [place] [address] Phone: [phone] Fax: [phone]  [facility] [address] [place] [address] Phone: [phone] Fax: [phone]     Social Determinants of Health (SDOH) Interventions Food Insecurity Interventions: Intervention Not Indicated Financial Strain Interventions: Intervention Not Indicated Housing Interventions: Intervention Not Indicated Transportation Interventions: Intervention Not Indicated  Readmission Risk Interventions Readmission Risk Prevention Plan 06/25/2020  PCP or Specialist Appt within 3-5 Days (No Data)  [place] or Home Care Consult Complete  Social Work Consult for [place] Planning/Counseling Complete  Palliative Care Screening Not Applicable  Medication Review Press photographer) Complete  Some recent data might be hidden

## 2021-08-13 NOTE — Progress Notes (Signed)
Peripherally Inserted Central Catheter Placement  The IV Nurse has discussed with the patient and/or persons authorized to consent for the patient, the purpose of this procedure and the potential benefits and risks involved with this procedure.  The benefits include less needle sticks, lab draws from the catheter, and the patient may be discharged home with the catheter. Risks include, but not limited to, infection, bleeding, blood clot (thrombus formation), and puncture of an artery; nerve damage and irregular heartbeat and possibility to perform a PICC exchange if needed/ordered by physician.  Alternatives to this procedure were also discussed.  Bard Power PICC patient education guide, fact sheet on infection prevention and patient information card has been provided to patient /or left at bedside.  PICC inserted by Quin Hoop, RN  PICC Placement Documentation  PICC Double Lumen 08/13/21 PICC Right Brachial 37 cm 1 cm (Active)  Indication for Insertion or Continuance of Line Vasoactive infusions 08/13/21 1814  Exposed Catheter (cm) 1 cm 08/13/21 1814  Site Assessment Clean;Dry;Intact 08/13/21 1814  Lumen #1 Status Flushed;Saline locked;Blood return noted 08/13/21 1814  Lumen #2 Status Flushed;Saline locked;Blood return noted 08/13/21 1814  Dressing Type Transparent 08/13/21 1814  Dressing Status Clean;Dry;Intact 08/13/21 1814  Antimicrobial disc in place? Yes 08/13/21 1814  Line Care Connections checked and tightened 08/13/21 1814  Line Adjustment (NICU/IV Team Only) No 08/13/21 1814  Dressing Intervention New dressing 08/13/21 1814  Dressing Change Due 08/20/21 08/13/21 1814       Piper Hassebrock, Nicolette Bang 08/13/2021, 6:15 PM

## 2021-08-13 NOTE — Progress Notes (Signed)
Mobility Specialist Progress Note    08/13/21 1101  Mobility  Activity Ambulated independently in hallway  Level of Assistance Standby assist, set-up cues, supervision of patient - no hands on  Assistive Device None  Distance Ambulated (ft) 470 ft  Activity Response Tolerated fair  $Mobility charge 1 Mobility   Pre-Mobility: 69 HR, 77/58 BP During Mobility: 69 HR Post-Mobility: 67 HR  Pt received in bed and agreeable. No complaints on walk. Returned to room with RN present and notified RN of BP.   Hildred Alamin Mobility Specialist  M.S. 2C and 6E: [phone] M.S. 4E: [phone]

## 2021-08-14 DIAGNOSIS — I10 Essential (primary) hypertension: Secondary | ICD-10-CM | POA: Diagnosis not present

## 2021-08-14 DIAGNOSIS — N179 Acute kidney failure, unspecified: Secondary | ICD-10-CM | POA: Diagnosis not present

## 2021-08-14 DIAGNOSIS — Z8673 Personal history of transient ischemic attack (TIA), and cerebral infarction without residual deficits: Secondary | ICD-10-CM | POA: Diagnosis not present

## 2021-08-14 DIAGNOSIS — I5023 Acute on chronic systolic (congestive) heart failure: Secondary | ICD-10-CM | POA: Diagnosis not present

## 2021-08-14 LAB — COOXEMETRY PANEL
Carboxyhemoglobin: 1.3 % (ref 0.5–1.5)
Carboxyhemoglobin: 1.7 % — ABNORMAL HIGH (ref 0.5–1.5)
Methemoglobin: 0.8 % (ref 0.0–1.5)
Methemoglobin: 0.5 % (ref 0.0–1.5)
O2 Saturation: 58.9 %
O2 Saturation: 59.9 %
Total hemoglobin: 12.9 g/dL (ref 12.0–16.0)
Total hemoglobin: 12.6 g/dL (ref 12.0–16.0)

## 2021-08-14 LAB — CBC
HCT: 40.5 % (ref 39.0–52.0)
Hemoglobin: 12.8 g/dL — ABNORMAL LOW (ref 13.0–17.0)
MCH: 25.4 pg — ABNORMAL LOW (ref 26.0–34.0)
MCHC: 31.6 g/dL (ref 30.0–36.0)
MCV: 80.4 fL (ref 80.0–100.0)
Platelets: 287 10*3/uL (ref 150–400)
RBC: 5.04 MIL/uL (ref 4.22–5.81)
RDW: 17.5 % — ABNORMAL HIGH (ref 11.5–15.5)
WBC: 9.6 10*3/uL (ref 4.0–10.5)
nRBC: 0 % (ref 0.0–0.2)

## 2021-08-14 LAB — BASIC METABOLIC PANEL
Anion gap: 9 (ref 5–15)
BUN: 40 mg/dL — ABNORMAL HIGH (ref 8–23)
CO2: 29 mmol/L (ref 22–32)
Calcium: 8.6 mg/dL — ABNORMAL LOW (ref 8.9–10.3)
Chloride: 100 mmol/L (ref 98–111)
Creatinine, Ser: 2.47 mg/dL — ABNORMAL HIGH (ref 0.61–1.24)
GFR, Estimated: 28 mL/min — ABNORMAL LOW (ref >60)
Glucose, Bld: 87 mg/dL (ref 70–99)
Potassium: 3.8 mmol/L (ref 3.5–5.1)
Sodium: 138 mmol/L (ref 135–145)

## 2021-08-14 MED ORDER — POTASSIUM CHLORIDE CRYS ER 20 MEQ PO TBCR
40.0000 meq | EXTENDED_RELEASE_TABLET | Freq: Once | ORAL | Status: AC
  Administered 2021-08-14: 40 meq via ORAL
  Filled 2021-08-14: qty 2

## 2021-08-14 MED ORDER — MIDODRINE HCL 5 MG PO TABS
2.5000 mg | ORAL_TABLET | Freq: Three times a day (TID) | ORAL | Status: DC
  Administered 2021-08-14 – 2021-08-15 (×3): 2.5 mg via ORAL
  Filled 2021-08-14 (×3): qty 1

## 2021-08-14 MED ORDER — POTASSIUM CHLORIDE CRYS ER 20 MEQ PO TBCR
40.0000 meq | EXTENDED_RELEASE_TABLET | Freq: Once | ORAL | Status: DC
  Filled 2021-08-14: qty 2

## 2021-08-14 MED ORDER — APIXABAN 5 MG PO TABS
5.0000 mg | ORAL_TABLET | Freq: Two times a day (BID) | ORAL | Status: DC
  Administered 2021-08-14 – 2021-08-21 (×15): 5 mg via ORAL
  Filled 2021-08-14 (×15): qty 1

## 2021-08-14 NOTE — TOC CM/SW Note (Signed)
HF TOC offered choice for [facility], pt agreeable to [facility] for [facility]. Contacted Kenna Gilbert rep with new referral. Pt's home address is [place], [place], Heart Failure TOC CM [phone]

## 2021-08-14 NOTE — Progress Notes (Addendum)
Progress Note   Patient: [name] FBP:[id number] DOB: Jun 06, 1956 DOA: 08/09/2021     4 DOS: the patient was seen and examined on 08/14/2021   Brief hospital course: [name] was admitted to the hospital with the working diagnosis of decompensated heart failure.   66 yo male with the past medical history of CVA with right hemiparesis, CKD stage 3a, dyslipidemia, heart failure, thoracic aortic aneurysm, pulmonary embolism, HTN, and V tach sp AICD, who presented with dyspnea and edema. Reported worsening dyspnea for the last 2 to 3 days leading to his hospitalization, associated with significant lower extremity and scrotal edema. He has been not compliant with his medications at home. On his initial physical examination his blood pressure was 125/95, HR 93, RR 22, Oxygen saturation 95% on supplemental 02 per [place]. His lungs had no rales, or wheezing, heart with S1 and S2 present, with no gallops or murmurs, abdomen with no distention, positive lower extremity edema.   Na 134, K 4,9, cl 102, bicarb 20, glucose 111, bun 47 and cr 1,87  High sensitive troponin 31 and 32 Wbc 10,5, hgb 12,0, hct 41,6 and plt 282 SARS COVID 19 negative  Chest radiograph with cardiomegaly and vascular hilar congestion bilaterally   EKG 92 bpm, normal axis, normal intervals, sinus rhythm, with q wave lead II, III, AVF, V4 to V6 with no significant ST segment or T wave changes.   Patient has diuresed about 4 L since admission with improvement in edema of lower extremities.  He has developed hypotension limiting further diuresis.  Patient with signs of lowe cardiac output.  Advanced hear failure team has been consulted, patient has been placed on dobutamine to allow de-congestion.     Assessment and Plan Assessment and Plan: * Acute on chronic systolic CHF (congestive heart failure) ([place])- (present on admission) Echocardiogram from 09/22 with EF 20 to 25% with severe hypokinesis and akinesis of the entire  lateral wall. RV systolic function was reduced moderately.  Echocardiogram this admission with poor windows.  Ischemic cardiomyopathy.   Systolic blood pressure continue to be low in the 90's and has been as low at 79 today. He continue to have JVD and scrotal edema.   Has been placed on inotropic support with dobutamine.  Continue diuresis with furosemide 80 mg IV q12 hrs  His urine output is documented at 2,575 ml   Continue to hold RAAS inhibition, b blockade due to risk of hypotension.  Patient has been placed on midodrine   Essential hypertension- (present on admission) Continue to have hypotension due to low output heart failure decompensation. Continue diuresis and blood pressure monitoring Patient now on dobutamine   Acute renal failure superimposed on stage 3a chronic kidney disease (HCC) Hyponatremia and hypokalemia Continue to worsen renal functio with serum cr at 2,47 with K at 3,8 and serum bicarbonate at 29. Na 138 Continue diuresis with furosemide and follow up renal function in am, avoid hypotension. Continue K correction to a target of 4.    Status post CVA On statin therapy and anticoagulation with apixaban  Pt and OT  PAF (paroxysmal atrial fibrillation) ([place])- (present on admission) Continue with amiodarone for rate control  and anticoagulation with apixaban.   CAD, multiple vessel -> severe native vessel CAD-CABG 2014- (present on admission) On clopidogrel and statin therapy  No acute coronary syndrome   History of ventricular tachycardia SP AICD  History of pulmonary embolus (PE) - on Eliquis- (present on admission) Continue with apixaban for anticoagulation with  good toleration    Hyperlipidemia with target LDL less than 70- (present on admission) Continue with atorvastatin and ezetimibe   DNR (do not resuscitate) discussion-resolved as of 08/14/2021 I spoke with [name] about advance directives in case worsening condition, including cardiac  and respiratory arrest. He has decided to change code status to DNR. He has lost his siblings and does not want aggressive measures, including CPR or mechanical ventilation. Will change code to DNR.   Patient changed code status back to FULL CODE.       Subjective: patient continue feeling very weak and deconditioned, edema is improving and he denies dyspnea or chest pain   Objective BP 99/68 (BP Location: Left Arm)    Pulse 70    Temp (!) 97.4 F (36.3 C) (Oral)    Resp 18    Ht 5\' 7"  (1.702 m)    Wt 85.8 kg Comment: scale A   SpO2 90%    BMI 29.62 kg/m   Neurology awake and alert ENT mild pallor  Cardiovascular heart with S1 and S2 present with positive systolic murmur at the right sternal border, no gallops Positive JVD Trace lower extremity edema, positive scrotal edema, Pulmonary  with no wheezing or rhonchi, no rales  Abdominal soft and non tender Skin Musculoskeletal   Data Reviewed:    Family Communication: no family at the bedside   Disposition: Status is: Inpatient Remains inpatient appropriate because: heart failure management   Planned Discharge Destination: Home             Author: Tawni Millers, MD 08/14/2021 1:58 PM  For on call review [email or web address].

## 2021-08-14 NOTE — Care Management Important Message (Signed)
Important Message  Patient Details  Name: [name] MRN: [id number] Date of Birth: [birth year]   Medicare Important Message Given:  Yes     Shelda Altes 08/14/2021, 10:33 AM

## 2021-08-14 NOTE — Plan of Care (Signed)
°  Problem: Education: Goal: Ability to verbalize understanding of medication therapies will improve Outcome: Progressing Goal: Individualized Educational Video(s) Outcome: Progressing   Problem: Cardiac: Goal: Ability to achieve and maintain adequate cardiopulmonary perfusion will improve Outcome: Progressing

## 2021-08-14 NOTE — Progress Notes (Signed)
Mobility Specialist Progress Note:   08/14/21 1412  Mobility  Activity Ambulated with assistance in hallway  Level of Assistance Independent after set-up  Assistive Device None  Distance Ambulated (ft) 470 ft  Activity Response Tolerated well  $Mobility charge 1 Mobility   Pt received in bed willing to participate in mobility. No complaints of pain and asymptomatic. Pt left in bed with call bell in reach and all needs met.   [facility] Public librarian Phone [phone] Secondary Phone [phone]

## 2021-08-14 NOTE — Progress Notes (Addendum)
Advanced Heart Failure Rounding Note  PCP-Cardiologist: Sanda Klein, MD   Subjective:    On DBA 2.5. Co-ox 59%   UOP picking up, 2.6L out yesterday. Wt down 2 lb. CVP 14-15   Scr trending up, 2.15>>2.35>>2.47. SBPs low 100s.    Denies any current resting dyspnea. No Chest pain.    Objective:   Weight Range: 85.8 kg Body mass index is 29.62 kg/m.   Vital Signs:   Temp:  [97.5 F (36.4 C)-98.3 F (36.8 C)] 98.3 F (36.8 C) (01/31 0733) Pulse Rate:  [59-76] 76 (01/31 0733) Resp:  [16-18] 18 (01/31 0733) BP: (87-101)/(63-76) 101/75 (01/31 0733) SpO2:  [95 %-100 %] 96 % (01/31 0733) Weight:  [85.8 kg] 85.8 kg (01/31 0447) Last BM Date: 08/13/21  Weight change: Filed Weights   08/12/21 0400 08/13/21 0419 08/14/21 0447  Weight: 86.8 kg 86.9 kg 85.8 kg    Intake/Output:   Intake/Output Summary (Last 24 hours) at 08/14/2021 0808 Last data filed at 08/14/2021 0732 Gross per 24 hour  Intake 759.25 ml  Output 2750 ml  Net -1990.75 ml      Physical Exam    CVP 14-15  General:  fatigued appearing. No resp difficulty HEENT: Normal Neck: Supple. JVP elevated to jaw . Carotids 2+ bilat; no bruits. No lymphadenopathy or thyromegaly appreciated. Cor: PMI nondisplaced. Regular rate & rhythm. No rubs, gallops or murmurs. Lungs: decreased BS at the bases bilaterally  Abdomen: Soft, nontender, nondistended. No hepatosplenomegaly. No bruits or masses. Good bowel sounds. Extremities: No cyanosis, clubbing, rash, edema, LEs cool to touch +RUE PICC  Neuro: Alert & orientedx3, cranial nerves grossly intact. moves all 4 extremities w/o difficulty. Affect pleasant   Telemetry   NSR 70s, intermittently Vpaced   EKG    No new EKG to review   Labs    CBC Recent Labs    08/14/21 0500  WBC 9.6  HGB 12.8*  HCT 40.5  MCV 80.4  PLT 628   Basic Metabolic Panel Recent Labs    08/13/21 0353 08/14/21 0500  NA 134* 138  K 5.0 3.8  CL 99 100  CO2 26 29  GLUCOSE  94 87  BUN 42* 40*  CREATININE 2.35* 2.47*  CALCIUM 8.7* 8.6*  MG 2.1  --    Liver Function Tests No results for input(s): AST, ALT, ALKPHOS, BILITOT, PROT, ALBUMIN in the last 72 hours. No results for input(s): LIPASE, AMYLASE in the last 72 hours. Cardiac Enzymes No results for input(s): CKTOTAL, CKMB, CKMBINDEX, TROPONINI in the last 72 hours.  BNP: BNP (last 3 results) Recent Labs    08/10/21 0218  BNP 2,604.2*    ProBNP (last 3 results) No results for input(s): PROBNP in the last 8760 hours.   D-Dimer No results for input(s): DDIMER in the last 72 hours. Hemoglobin A1C No results for input(s): HGBA1C in the last 72 hours. Fasting Lipid Panel No results for input(s): CHOL, HDL, LDLCALC, TRIG, CHOLHDL, LDLDIRECT in the last 72 hours. Thyroid Function Tests No results for input(s): TSH, T4TOTAL, T3FREE, THYROIDAB in the last 72 hours.  Invalid input(s): FREET3  Other results:   Imaging    [place] EKG SITE RITE  Result Date: 08/13/2021 If Site Rite image not attached, placement could not be confirmed due to current cardiac rhythm.    Medications:     Scheduled Medications:  amiodarone  200 mg Oral BID   apixaban  5 mg Oral BID   atorvastatin  80 mg Oral  Daily   Chlorhexidine Gluconate Cloth  6 each Topical Daily   clopidogrel  75 mg Oral Daily   dapagliflozin propanediol  10 mg Oral Daily   escitalopram  10 mg Oral Daily   ezetimibe  10 mg Oral Daily   furosemide  80 mg Intravenous BID   potassium chloride  40 mEq Oral Once   sodium chloride flush  10-40 mL Intracatheter Q12H   sodium chloride flush  3 mL Intravenous Q12H    Infusions:  DOBUTamine 2.5 mcg/kg/min (08/14/21 0502)    PRN Medications: acetaminophen **OR** acetaminophen, nitroGLYCERIN, nystatin, polyethylene glycol, sodium chloride flush, traZODone, trimethobenzamide    Patient Profile   66 y/o male with severe CAD s/p CABG 2014 with multiple subsequent MIs/PCIs., systolic HF due to  iCM EF 20-25%, CKD 3B admitted with a/c systolic dysfunction with marked volume overload, worsening renal failure and low BP concerning for low output. Started on DBA.    Echo EF 20-25% Moderate RV dysfunction   Assessment/Plan   Acute on chronic systolic CHF with biventricular failure/ICM: - Echo 05/04/20 EF 25%. Moderate RV to severe RV dysfunction and possible LV thrombus.  - [place] 10/21 with preserved output and well compensated filling pressures.  - Echo 11/21 EF 25-30%,  - Admit with a/c CHF - has not been totally adherent with medical therapy - Echo 08/10/21: EF 20-25% Moderate RV dysfunction  - Developed hypotension and worsening renal function with attempts to diurese with IV lasix, concerning for low output, started on DBA 2.5 - Co-ox 59% today on DBA. UOP picking up. CVP 15. SCr continues to trend up,  SBPs low 100s - Continue DBA 2.5 and IV Lasix 80 mg bid. May need metolazone.  - Add midodrine 2.5 tid to help w/ renal perfusion, keep SBP > 110  - No VT on device interrogation  - Hold Farxiga given rising SCr  - Off entresto and spiro with AKI and hypotension - Off beta blocker with a/c CHF - He remains undecided regarding advanced therapies, wishes to d/w family but remains full code.  - If renal fx continues to deteriorate, would plan for RHC. ? Need for mechanical support w/ Impella  - Would benefit from Palliative Consult for [place] discussion.    2. CAD: - s/p CABG 2014 - Inferior STEMI 09/20 s/p DES SVG to RCA anastomosis - Inferior STEMI 05/03/20 s/p POBA SVG->PDA - Inferoposterior STEMI 09/22 >> thrombotic occlusion SVG to OM1 - On plavix. No ASA d/t anticoagulation.  - On atorvastatin 80 - Denies CP. HS trop low level and flat, 31>>32    3. Hx VT arrest: - S/p dual chamber ICD placement - On po amio 200 BID - No VT on tele nor device interrogation    4. Hx Bilateral PE/b/l DVT: - Anticoagulated with Eliquis   5. Possible LV thrombus: - Noted on echo 10/21,  TEE 11/21 with no thrombus   6. AKI on CKD IIIb: - Baseline Scr 1.4, 1.87 on admit>>up to 2.47 today  - suspect cardiorenal/low output - support CO w/ DBA, c/w diuresis  - Add midodrine 2.5 tid to help w/ renal perfusion and keep SBPs > 110  - follow BMP      Length of Stay: [address], PA-C  08/14/2021, 8:08 AM  Advanced Heart Failure Team Pager [phone] (M-F; 7a - 5p)  Please contact [facility] for night-coverage after hours (5p -7a ) and weekends on [email or web address]  Patient seen and examined with the above-signed Advanced  Practice Provider and/or Housestaff. I personally reviewed laboratory data, imaging studies and relevant notes. I independently examined the patient and formulated the important aspects of the plan. I have edited the note to reflect any of my changes or salient points. I have personally discussed the plan with the patient and/or family.  Started on DBA yesterday for low output HF. Co-ox improved Feeling better. Less dyspneic. No orthopnea. Diuresing well. Scr up just a bit. CVP 12-13 on my read   General:  Sitting up in bed  No resp difficulty HEENT: normal Neck: supple. JVP to jaw Carotids 2+ bilat; no bruits. No lymphadenopathy or thryomegaly appreciated. Cor: PMI nondisplaced. Regular rate & rhythm. No rubs, gallops or murmurs. Lungs: clear Abdomen: soft, nontender, nondistended. No hepatosplenomegaly. No bruits or masses. Good bowel sounds. Extremities: no cyanosis, clubbing, rash, tr-1+ edema Neuro: alert & orientedx3, cranial nerves grossly intact. moves all 4 extremities w/o difficulty. Affect pleasant  Improved with dobutamine. Remains volume overloaded. Will diurese fully and then try to wean inotropes. Add midodrine to help with BP. Suspect he is heading toward advanced therapies (I.e VAD) if renal function imrproves but he is unsure if he wants to consider.  Glori Bickers, MD  6:19 PM

## 2021-08-15 DIAGNOSIS — I10 Essential (primary) hypertension: Secondary | ICD-10-CM | POA: Diagnosis not present

## 2021-08-15 DIAGNOSIS — N179 Acute kidney failure, unspecified: Secondary | ICD-10-CM | POA: Diagnosis not present

## 2021-08-15 DIAGNOSIS — I5023 Acute on chronic systolic (congestive) heart failure: Secondary | ICD-10-CM | POA: Diagnosis not present

## 2021-08-15 DIAGNOSIS — Z8673 Personal history of transient ischemic attack (TIA), and cerebral infarction without residual deficits: Secondary | ICD-10-CM | POA: Diagnosis not present

## 2021-08-15 LAB — COOXEMETRY PANEL
Carboxyhemoglobin: 1.6 % — ABNORMAL HIGH (ref 0.5–1.5)
Methemoglobin: 1.1 % (ref 0.0–1.5)
O2 Saturation: 56 %
Total hemoglobin: 13 g/dL (ref 12.0–16.0)

## 2021-08-15 LAB — BASIC METABOLIC PANEL
Anion gap: 10 (ref 5–15)
BUN: 34 mg/dL — ABNORMAL HIGH (ref 8–23)
CO2: 30 mmol/L (ref 22–32)
Calcium: 8.6 mg/dL — ABNORMAL LOW (ref 8.9–10.3)
Chloride: 98 mmol/L (ref 98–111)
Creatinine, Ser: 2.44 mg/dL — ABNORMAL HIGH (ref 0.61–1.24)
GFR, Estimated: 28 mL/min — ABNORMAL LOW (ref >60)
Glucose, Bld: 94 mg/dL (ref 70–99)
Potassium: 3.8 mmol/L (ref 3.5–5.1)
Sodium: 138 mmol/L (ref 135–145)

## 2021-08-15 LAB — MAGNESIUM: Magnesium: 1.9 mg/dL (ref 1.7–2.4)

## 2021-08-15 MED ORDER — MAGNESIUM SULFATE 2 GM/50ML IV SOLN
2.0000 g | Freq: Once | INTRAVENOUS | Status: AC
  Administered 2021-08-15: 2 g via INTRAVENOUS
  Filled 2021-08-15: qty 50

## 2021-08-15 MED ORDER — POTASSIUM CHLORIDE CRYS ER 20 MEQ PO TBCR
40.0000 meq | EXTENDED_RELEASE_TABLET | Freq: Once | ORAL | Status: AC
  Administered 2021-08-15: 40 meq via ORAL
  Filled 2021-08-15: qty 2

## 2021-08-15 MED ORDER — AMIODARONE HCL IN DEXTROSE 360-4.14 MG/200ML-% IV SOLN
30.0000 mg/h | INTRAVENOUS | Status: DC
  Administered 2021-08-15 – 2021-08-17 (×5): 30 mg/h via INTRAVENOUS
  Filled 2021-08-15 (×5): qty 200

## 2021-08-15 MED ORDER — METOLAZONE 2.5 MG PO TABS
2.5000 mg | ORAL_TABLET | Freq: Once | ORAL | Status: AC
  Administered 2021-08-15: 2.5 mg via ORAL
  Filled 2021-08-15: qty 1

## 2021-08-15 MED ORDER — MIDODRINE HCL 5 MG PO TABS
5.0000 mg | ORAL_TABLET | Freq: Three times a day (TID) | ORAL | Status: DC
  Administered 2021-08-15 (×2): 5 mg via ORAL
  Filled 2021-08-15 (×2): qty 1

## 2021-08-15 NOTE — Progress Notes (Signed)
PROGRESS NOTE    [name]  UXN:[id number] DOB: 25-Jan-1956 DOA: 08/09/2021 PCP: [name], MD  Brief Narrative: [name] with history of CVA, right hemiparesis, CKD 3 AAA, chronic systolic CHF-EF 25-42%, PE, thoracic aortic aneurysm, history of V. tach status post AICD presented to the ED with worsening dyspnea and edema.  Chest x-ray noted cardiomegaly and pulmonary vascular congestion.     Subjective:  -Breathing better, denies any chest pain, ambulating better  Hospital Problems  Acute systolic CHF Biventricular heart failure, ischemic cardiomyopathy -EF 25% with moderate to severe RV dysfunction, possible LV thrombus -Hospital course complicated by hypotension and worsening renal function, low output heart failure -Started on dobutamine, IV Lasix -Also started on midodrine, increase to 3 times daily -Off Entresto, Farxiga and Aldactone with AKI, hypotension -Per CHF team  History of PE, bilateral DVT -Continue Eliquis  History of V. tach arrest -Status post ICD, currently on amnio gtt. -Monitor electrolytes  CAD, multiple vessel -> severe native vessel CAD-CABG 2014- (present on admission) On clopidogrel and statin therapy  -Beta-blocker on hold, no evidence of ACS  Acute renal failure superimposed on stage 3a chronic kidney disease (HCC) Hyponatremia and hypokalemia -Creatinine slowly worsened, baseline creatinine around 1.4, trended up to 2.47, now plateauing -suspected to be low output heart failure, cardiorenal syndrome  -Avoid hypotension, continue dobutamine  -Now on midodrine  -Monitor urine output, BMP   Status post CVA On statin therapy and anticoagulation with apixaban   Hyperlipidemia with target LDL less than 70- (present on admission) Continue with atorvastatin and ezetimibe  Dr. Cathlean Sauer discussed advanced directives with [name], he wished to be DNR, orders updated   Objective Vital signs reviewed, blood pressure in the 90-100 range,  lowest was 87   Examination:  General exam: Appears calm and comfortable, AAOx3 Respiratory system: Clear to auscultation Cardiovascular system: S1 & S2 heard, RRR.  Abd: nondistended, soft and nontender.Normal bowel sounds heard. Extremities: Trace edema Skin: No rashes Psychiatry:  Mood & affect appropriate.     Data Reviewed:   CBC: Recent Labs  Lab 08/09/21 1645 08/10/21 0219 08/14/21 0500  WBC 10.5 10.8* 9.6  HGB 12.9* 13.1 12.8*  HCT 41.6 40.5 40.5  MCV 81.1 80.7 80.4  PLT 282 262 706   Basic Metabolic Panel: Recent Labs  Lab 08/10/21 0218 08/10/21 0219 08/11/21 0316 08/12/21 1632 08/13/21 0353 08/14/21 0500 08/15/21 0518  NA  --    < > 138 133* 134* 138 138  K  --    < > 3.8 4.5 5.0 3.8 3.8  CL  --    < > 102 97* 99 100 98  CO2  --    < > 28 27 26 29 30   GLUCOSE  --    < > 93 101* 94 87 94  BUN  --    < > 36* 37* 42* 40* 34*  CREATININE  --    < > 1.91* 2.15* 2.35* 2.47* 2.44*  CALCIUM  --    < > 8.1* 8.6* 8.7* 8.6* 8.6*  MG 2.0  --  2.1  --  2.1  --  1.9   < > = values in this interval not displayed.   GFR: Estimated Creatinine Clearance: 30.3 mL/min (A) (by C-G formula based on SCr of 2.44 mg/dL (H)). Liver Function Tests: No results for input(s): AST, ALT, ALKPHOS, BILITOT, PROT, ALBUMIN in the last 168 hours. No results for input(s): LIPASE, AMYLASE in the last 168 hours. No results for  input(s): AMMONIA in the last 168 hours. Coagulation Profile: No results for input(s): INR, PROTIME in the last 168 hours. Cardiac Enzymes: No results for input(s): CKTOTAL, CKMB, CKMBINDEX, TROPONINI in the last 168 hours. BNP (last 3 results) No results for input(s): PROBNP in the last 8760 hours. HbA1C: No results for input(s): HGBA1C in the last 72 hours. CBG: Recent Labs  Lab 08/11/21 0635 08/11/21 1608  GLUCAP 101* 100*   Lipid Profile: No results for input(s): CHOL, HDL, LDLCALC, TRIG, CHOLHDL, LDLDIRECT in the last 72 hours. Thyroid Function  Tests: No results for input(s): TSH, T4TOTAL, FREET4, T3FREE, THYROIDAB in the last 72 hours. Anemia Panel: No results for input(s): VITAMINB12, FOLATE, FERRITIN, TIBC, IRON, RETICCTPCT in the last 72 hours. Urine analysis:    Component Value Date/Time   COLORURINE YELLOW 06/22/2020 1728   APPEARANCEUR HAZY (A) 06/22/2020 1728   LABSPEC 1.018 06/22/2020 1728   PHURINE 5.0 06/22/2020 1728   GLUCOSEU NEGATIVE 06/22/2020 1728   HGBUR NEGATIVE 06/22/2020 1728   BILIRUBINUR NEGATIVE 06/22/2020 1728   KETONESUR NEGATIVE 06/22/2020 1728   PROTEINUR 30 (A) 06/22/2020 1728   UROBILINOGEN 0.2 01/06/2011 2117   NITRITE NEGATIVE 06/22/2020 1728   LEUKOCYTESUR NEGATIVE 06/22/2020 1728   Sepsis Labs: @LABRCNTIP (procalcitonin:4,lacticidven:4)  ) Recent Results (from the past 240 hour(s))  Resp Panel by RT-PCR (Flu A&B, Covid) Nasopharyngeal Swab     Status: None   Collection Time: 08/10/21 12:17 AM   Specimen: Nasopharyngeal Swab; Nasopharyngeal(NP) swabs in vial transport medium  Result Value Ref Range Status   SARS Coronavirus 2 by RT PCR NEGATIVE NEGATIVE Final    Comment: (NOTE) SARS-CoV-2 target nucleic acids are NOT DETECTED.  The SARS-CoV-2 RNA is generally detectable in upper respiratory specimens during the acute phase of infection. The lowest concentration of SARS-CoV-2 viral copies this assay can detect is 138 copies/mL. A negative result does not preclude SARS-Cov-2 infection and should not be used as the sole basis for treatment or other patient management decisions. A negative result may occur with  improper specimen collection/handling, submission of specimen other than nasopharyngeal swab, presence of viral mutation(s) within the areas targeted by this assay, and inadequate number of viral copies(<138 copies/mL). A negative result must be combined with clinical observations, patient history, and epidemiological information. The expected result is Negative.  Fact Sheet  for Patients:  EntrepreneurPulse.com.au  Fact Sheet for Healthcare Providers:  IncredibleEmployment.be  This test is no t yet approved or cleared by the [place] FDA and  has been authorized for detection and/or diagnosis of SARS-CoV-2 by FDA under an Emergency Use Authorization (EUA). This EUA will remain  in effect (meaning this test can be used) for the duration of the COVID-19 declaration under Section 564(b)(1) of the Act, 21 U.S.C.section 360bbb-3(b)(1), unless the authorization is terminated  or revoked sooner.       Influenza A by PCR NEGATIVE NEGATIVE Final   Influenza B by PCR NEGATIVE NEGATIVE Final    Comment: (NOTE) The Xpert Xpress SARS-CoV-2/FLU/RSV plus assay is intended as an aid in the diagnosis of influenza from Nasopharyngeal swab specimens and should not be used as a sole basis for treatment. Nasal washings and aspirates are unacceptable for Xpert Xpress SARS-CoV-2/FLU/RSV testing.  Fact Sheet for Patients: EntrepreneurPulse.com.au  Fact Sheet for Healthcare Providers: IncredibleEmployment.be  This test is not yet approved or cleared by the [place] FDA and has been authorized for detection and/or diagnosis of SARS-CoV-2 by FDA under an Emergency Use Authorization (EUA). This EUA will  remain in effect (meaning this test can be used) for the duration of the COVID-19 declaration under Section 564(b)(1) of the Act, 21 U.S.C. section 360bbb-3(b)(1), unless the authorization is terminated or revoked.  Performed at [facility], [place] [address], [place], [place] [address]          Radiology Studies: [place] EKG SITE RITE  Result Date: 08/13/2021 If Site Rite image not attached, placement could not be confirmed due to current cardiac rhythm.       Scheduled Meds:  apixaban  5 mg Oral BID   atorvastatin  80 mg Oral Daily   Chlorhexidine Gluconate Cloth  6 each  Topical Daily   clopidogrel  75 mg Oral Daily   escitalopram  10 mg Oral Daily   ezetimibe  10 mg Oral Daily   furosemide  80 mg Intravenous BID   midodrine  5 mg Oral TID WC   sodium chloride flush  10-40 mL Intracatheter Q12H   Continuous Infusions:  amiodarone 30 mg/hr (08/15/21 1141)   DOBUTamine 2.5 mcg/kg/min (08/15/21 0508)     LOS: 5 days    Time spent: 39min    Domenic Polite, MD [facility]   08/15/2021, 3:57 PM

## 2021-08-15 NOTE — Progress Notes (Addendum)
Advanced Heart Failure Rounding Note  PCP-Cardiologist: Sanda Klein, MD   Subjective:    On DBA 2.5. Co-ox 56%  On 80 mg lasix IV BID. 3.3L UOP yesterday. CVP 10-11.  Scr stabilized, 2.44 today. Weight down 6 lb overnight.  BP 90s - low 100s with midodrine  He ambulated in halls yesterday. Dyspnea improved. No CP. Appetite slightly better.    NSR on tele + frequent PVCs on DBA   Objective:   Weight Range: 83.1 kg Body mass index is 28.69 kg/m.   Vital Signs:   Temp:  [97.4 F (36.3 C)-98.2 F (36.8 C)] 97.6 F (36.4 C) (02/01 0752) Pulse Rate:  [64-73] 73 (02/01 0752) Resp:  [16-18] 18 (02/01 0752) BP: (92-128)/(65-83) 101/75 (02/01 0752) SpO2:  [90 %-99 %] 96 % (02/01 0752) Weight:  [83.1 kg] 83.1 kg (02/01 0502) Last BM Date: 08/14/21  Weight change: Filed Weights   08/13/21 0419 08/14/21 0447 08/15/21 0502  Weight: 86.9 kg 85.8 kg 83.1 kg    Intake/Output:   Intake/Output Summary (Last 24 hours) at 08/15/2021 0956 Last data filed at 08/15/2021 0756 Gross per 24 hour  Intake 347.91 ml  Output 3150 ml  Net -2802.09 ml      Physical Exam    CVP 10-11 General:  No distress, lying comfortably in bed HEENT: normal Neck: supple. JVP to ear. Carotids 2+ bilat; no bruits.  Cor: PMI nondisplaced. Regular rate & rhythm with ectopy. No rubs, gallops or murmurs. Lungs: clear Abdomen: soft, nontender, nondistended. No hepatosplenomegaly.  Extremities: no cyanosis, clubbing, rash, trace edema Neuro: alert & orientedx3, cranial nerves grossly intact. moves all 4 extremities w/o difficulty. Affect pleasant    Telemetry   NSR 70s + 15-20 PVCs/min  Labs    CBC Recent Labs    08/14/21 0500  WBC 9.6  HGB 12.8*  HCT 40.5  MCV 80.4  PLT 810   Basic Metabolic Panel Recent Labs    08/13/21 0353 08/14/21 0500 08/15/21 0518  NA 134* 138 138  K 5.0 3.8 3.8  CL 99 100 98  CO2 26 29 30   GLUCOSE 94 87 94  BUN 42* 40* 34*  CREATININE 2.35*  2.47* 2.44*  CALCIUM 8.7* 8.6* 8.6*  MG 2.1  --  1.9   Liver Function Tests No results for input(s): AST, ALT, ALKPHOS, BILITOT, PROT, ALBUMIN in the last 72 hours. No results for input(s): LIPASE, AMYLASE in the last 72 hours. Cardiac Enzymes No results for input(s): CKTOTAL, CKMB, CKMBINDEX, TROPONINI in the last 72 hours.  BNP: BNP (last 3 results) Recent Labs    08/10/21 0218  BNP 2,604.2*    ProBNP (last 3 results) No results for input(s): PROBNP in the last 8760 hours.   D-Dimer No results for input(s): DDIMER in the last 72 hours. Hemoglobin A1C No results for input(s): HGBA1C in the last 72 hours. Fasting Lipid Panel No results for input(s): CHOL, HDL, LDLCALC, TRIG, CHOLHDL, LDLDIRECT in the last 72 hours. Thyroid Function Tests No results for input(s): TSH, T4TOTAL, T3FREE, THYROIDAB in the last 72 hours.  Invalid input(s): FREET3  Other results:   Imaging    No results found.   Medications:     Scheduled Medications:  amiodarone  200 mg Oral BID   apixaban  5 mg Oral BID   atorvastatin  80 mg Oral Daily   Chlorhexidine Gluconate Cloth  6 each Topical Daily   clopidogrel  75 mg Oral Daily   escitalopram  10  mg Oral Daily   ezetimibe  10 mg Oral Daily   furosemide  80 mg Intravenous BID   midodrine  2.5 mg Oral TID WC   potassium chloride  40 mEq Oral Once   sodium chloride flush  10-40 mL Intracatheter Q12H    Infusions:  DOBUTamine 2.5 mcg/kg/min (08/15/21 0508)    PRN Medications: acetaminophen **OR** acetaminophen, nitroGLYCERIN, nystatin, polyethylene glycol, sodium chloride flush, traZODone, trimethobenzamide    Patient Profile   66 y/o male with severe CAD s/p CABG 2014 with multiple subsequent MIs/PCIs., systolic HF due to iCM EF 20-25%, CKD 3B admitted with a/c systolic dysfunction with marked volume overload, worsening renal failure and low BP concerning for low output. Started on DBA.    Echo EF 20-25% Moderate RV dysfunction    Assessment/Plan   Acute on chronic systolic CHF with biventricular failure/ICM: - Echo 05/04/20 EF 25%. Moderate RV to severe RV dysfunction and possible LV thrombus.  - [place] 10/21 with preserved output and well compensated filling pressures.  - Echo 11/21 EF 25-30%,  - Admit with a/c CHF - has not been totally adherent with medical therapy - Echo 08/10/21: EF 20-25% Moderate RV dysfunction  - Developed hypotension and worsening renal function with attempts to diurese with IV lasix, concerning for low output, started on DBA 2.5 - Co-ox 56% today on DBA 2.5. - Diuresing well with IV lasix 80 BID. CVP 10-11. Still volume overloaded on exam. Continue IV lasix and will give 2.5 mg metolazone once. Supp K and Mag today. - Increase midodrine to 5 mg TID to support BP - No VT on device interrogation  - Hold Farxiga for now with AKI and CKD - Off entresto and spiro with AKI and hypotension - Off beta blocker with a/c CHF - Plan for RHC once fully diuresed. Suspect he is headed towards advanced therapies (VAD) if renal function improves. He is still contemplating if he would consider advanced therapies, remains full code.   2. CAD: - s/p CABG 2014 - Inferior STEMI 09/20 s/p DES SVG to RCA anastomosis - Inferior STEMI 05/03/20 s/p POBA SVG->PDA - Inferoposterior STEMI 09/22 >> thrombotic occlusion SVG to OM1 - On plavix. No ASA d/t anticoagulation.  - On atorvastatin 80 - Denies CP. HS trop low level and flat, 31>>32    3. Hx VT arrest/frequent PVCs: - S/p dual chamber ICD placement - No VT on tele nor device interrogation  - Frequent PVCs on tele. Will switch po amio to amio gtt at 30/hr while on DBA - Keep K > 4 and Mag > 2   4. Hx Bilateral PE/b/l DVT: - Anticoagulated with Eliquis   5. Possible LV thrombus: - Noted on echo 10/21, TEE 11/21 with no thrombus   6. AKI on CKD IIIb: - Baseline Scr 1.4, 1.87 on admit>>plateaued at 2.47, 2.44 today - suspect cardiorenal/low output -  support CO w/ DBA, c/w diuresis  - Increase midodrine to 5 tid to help w/ renal perfusion and keep SBPs > 110  - follow BMP     Length of Stay: 5  FINCH, LINDSAY N, PA-C  08/15/2021, 9:56 AM  Advanced Heart Failure Team Pager [phone] (M-F; 7a - 5p)  Please contact [facility] for night-coverage after hours (5p -7a ) and weekends on [email or web address]  Patient seen and examined with the above-signed Advanced Practice Provider and/or Housestaff. I personally reviewed laboratory data, imaging studies and relevant notes. I independently examined the patient and formulated the important aspects of  the plan. I have edited the note to reflect any of my changes or salient points. I have personally discussed the plan with the patient and/or family.  Remains on DBA. Co-ox 56% BP marginal. SCr plateaued. Diuresing well.   General:  Well appearing. No resp difficulty HEENT: normal Neck: supple. JVP to jaw Carotids 2+ bilat; no bruits. No lymphadenopathy or thryomegaly appreciated. Cor: PMI nondisplaced. Regular rate & rhythm. No rubs, gallops or murmurs. Lungs: clear Abdomen: soft, nontender, nondistended. No hepatosplenomegaly. No bruits or masses. Good bowel sounds. Extremities: no cyanosis, clubbing, rash, edema Neuro: alert & orientedx3, cranial nerves grossly intact. moves all 4 extremities w/o difficulty. Affect pleasant  Remains very tenuous. Continue DBA support and IV lasix. Agree with midodrine to support BP. If renal function continues to improve can consider VAD w/u but he remains quite reluctant about advanced therapies.   Glori Bickers, MD  11:44 AM

## 2021-08-15 NOTE — Progress Notes (Signed)
Mobility Specialist Progress Note: ° ° 08/15/21 1019  °Mobility  °Activity Ambulated with assistance in hallway  °Level of Assistance Independent after set-up  °Assistive Device None  °Distance Ambulated (ft) 470 ft  °Activity Response Tolerated well  °$Mobility charge 1 Mobility  ° °Pt received in bed willing to participate in mobility. No complaints of pain and asymptomatic. Pt left EOB with call bell in reach and all needs met.  ° °  °Mobility Specialist °Primary Phone [phone] °Secondary Phone [phone] ° °

## 2021-08-16 DIAGNOSIS — I5023 Acute on chronic systolic (congestive) heart failure: Secondary | ICD-10-CM | POA: Diagnosis not present

## 2021-08-16 LAB — COOXEMETRY PANEL
Carboxyhemoglobin: 1.5 % (ref 0.5–1.5)
Methemoglobin: 0.6 % (ref 0.0–1.5)
O2 Saturation: 58.9 %
Total hemoglobin: 13.7 g/dL (ref 12.0–16.0)

## 2021-08-16 LAB — CBC
HCT: 43.1 % (ref 39.0–52.0)
Hemoglobin: 13.7 g/dL (ref 13.0–17.0)
MCH: 25.2 pg — ABNORMAL LOW (ref 26.0–34.0)
MCHC: 31.8 g/dL (ref 30.0–36.0)
MCV: 79.2 fL — ABNORMAL LOW (ref 80.0–100.0)
Platelets: 354 10*3/uL (ref 150–400)
RBC: 5.44 MIL/uL (ref 4.22–5.81)
RDW: 17.6 % — ABNORMAL HIGH (ref 11.5–15.5)
WBC: 9.9 10*3/uL (ref 4.0–10.5)
nRBC: 0 % (ref 0.0–0.2)

## 2021-08-16 LAB — BASIC METABOLIC PANEL
Anion gap: 11 (ref 5–15)
BUN: 33 mg/dL — ABNORMAL HIGH (ref 8–23)
CO2: 34 mmol/L — ABNORMAL HIGH (ref 22–32)
Calcium: 9 mg/dL (ref 8.9–10.3)
Chloride: 93 mmol/L — ABNORMAL LOW (ref 98–111)
Creatinine, Ser: 2.36 mg/dL — ABNORMAL HIGH (ref 0.61–1.24)
GFR, Estimated: 29 mL/min — ABNORMAL LOW (ref >60)
Glucose, Bld: 98 mg/dL (ref 70–99)
Potassium: 3.4 mmol/L — ABNORMAL LOW (ref 3.5–5.1)
Sodium: 138 mmol/L (ref 135–145)

## 2021-08-16 LAB — MAGNESIUM: Magnesium: 2.3 mg/dL (ref 1.7–2.4)

## 2021-08-16 MED ORDER — POTASSIUM CHLORIDE CRYS ER 20 MEQ PO TBCR
40.0000 meq | EXTENDED_RELEASE_TABLET | Freq: Once | ORAL | Status: AC
  Administered 2021-08-16: 40 meq via ORAL
  Filled 2021-08-16: qty 2

## 2021-08-16 MED ORDER — MIDODRINE HCL 5 MG PO TABS
10.0000 mg | ORAL_TABLET | Freq: Three times a day (TID) | ORAL | Status: DC
  Administered 2021-08-16 – 2021-08-17 (×3): 10 mg via ORAL
  Filled 2021-08-16 (×3): qty 2

## 2021-08-16 NOTE — Progress Notes (Signed)
PROGRESS NOTE    [name]  UXN:[id number] DOB: 09-Sep-1955 DOA: 08/09/2021 PCP: [name], MD  Brief Narrative:[name] with history of CVA, right hemiparesis, CKD 3 AAA, chronic systolic CHF-EF 25-42%, PE, thoracic aortic aneurysm, history of V. tach status post AICD presented to the ED with worsening dyspnea and edema.  Chest x-ray noted cardiomegaly and pulmonary vascular congestion.  -Admitted with biventricular heart failure, low output heart state, with worsening kidney function -Started on dobutamine, IV Lasix  Subjective: -Breathing better, overall feels better, some nausea  Assessment & Plan:  * Acute on chronic systolic CHF (congestive heart failure) ([place])- (present on admission) Biventricular heart failure, ischemic cardiomyopathy -EF 25% with moderate to severe RV dysfunction -Hospital course complicated by hypotension and worsening renal function, low output heart failure -Started on dobutamine, IV Lasix -Volume status considerably better, 12.2 L negative, slight improvement in creatinine -Continue midodrine -Off Entresto, Farxiga and Aldactone with AKI, hypotension -Per CHF team-plan for family meeting with wife tomorrow, patient refuses right heart cath  History of pulmonary embolus (PE) - on Eliquis- (present on admission) Bilateral DVT  -continue Eliquis  CAD, multiple vessel -> severe native vessel CAD-CABG 2014- (present on admission) On clopidogrel and statin therapy  -Beta-blocker on hold -No evidence of ACS  Acute renal failure superimposed on stage 3a chronic kidney disease (HCC) Cardiorenal syndrome  hyponatremia and hypokalemia -Creatinine slowly worsened, baseline creatinine around 1.4, trended up to 2.47, now plateauing -low output heart failure -Avoid hypotension, continue dobutamine  -Now on midodrine  -Creatinine slightly better, trend  History of ventricular tachycardia SP AICD -Currently on amnio gtt. -Monitor  electrolytes  Status post CVA - Continue apixaban and statin  Hyperlipidemia with target LDL less than 70- (present on admission) Continue with atorvastatin and ezetimibe   Essential hypertension- (present on admission) See discussion above  DNR (do not resuscitate) discussion-resolved as of 08/14/2021 CODE STATUS changed back to full code, discussion with Dr. Cathlean Sauer  DVT prophylaxis: Eliquis Code Status: Full code Family Communication: No family at bedside Disposition Plan:   Consultants:  Cardiology  Procedures:   Antimicrobials:    Objective: Vitals:   08/16/21 0004 08/16/21 0342 08/16/21 0743 08/16/21 1100  BP: 94/65 97/87 100/71 104/76  Pulse:   (!) 58 72  Resp:  17 17   Temp: 98.1 F (36.7 C) (!) 97.5 F (36.4 C) 97.9 F (36.6 C) 98.1 F (36.7 C)  TempSrc: Oral Oral Oral Oral  SpO2: 100% 99% 99%   Weight:  80.7 kg    Height:        Intake/Output Summary (Last 24 hours) at 08/16/2021 1125 Last data filed at 08/16/2021 1100 Gross per 24 hour  Intake 1085.04 ml  Output 3850 ml  Net -2764.96 ml   Filed Weights   08/14/21 0447 08/15/21 0502 08/16/21 0342  Weight: 85.8 kg 83.1 kg 80.7 kg    Examination:  General exam: Appears calm and comfortable  Respiratory system: Clear to auscultation Cardiovascular system: S1 & S2 heard, RRR.  Abd: nondistended, soft and nontender.Normal bowel sounds heard. Central nervous system: Alert and oriented. No focal neurological deficits. Extremities: no edema Skin: No rashes Psychiatry:  Mood & affect appropriate.     Data Reviewed:   CBC: Recent Labs  Lab 08/09/21 1645 08/10/21 0219 08/14/21 0500 08/16/21 0510  WBC 10.5 10.8* 9.6 9.9  HGB 12.9* 13.1 12.8* 13.7  HCT 41.6 40.5 40.5 43.1  MCV 81.1 80.7 80.4 79.2*  PLT 282 262 287 354  Basic Metabolic Panel: Recent Labs  Lab 08/10/21 0218 08/10/21 0219 08/11/21 0316 08/12/21 1632 08/13/21 0353 08/14/21 0500 08/15/21 0518 08/16/21 0510  NA  --    <  > 138 133* 134* 138 138 138  K  --    < > 3.8 4.5 5.0 3.8 3.8 3.4*  CL  --    < > 102 97* 99 100 98 93*  CO2  --    < > 28 27 26 29 30  34*  GLUCOSE  --    < > 93 101* 94 87 94 98  BUN  --    < > 36* 37* 42* 40* 34* 33*  CREATININE  --    < > 1.91* 2.15* 2.35* 2.47* 2.44* 2.36*  CALCIUM  --    < > 8.1* 8.6* 8.7* 8.6* 8.6* 9.0  MG 2.0  --  2.1  --  2.1  --  1.9 2.3   < > = values in this interval not displayed.   GFR: Estimated Creatinine Clearance: 30.9 mL/min (A) (by C-G formula based on SCr of 2.36 mg/dL (H)). Liver Function Tests: No results for input(s): AST, ALT, ALKPHOS, BILITOT, PROT, ALBUMIN in the last 168 hours. No results for input(s): LIPASE, AMYLASE in the last 168 hours. No results for input(s): AMMONIA in the last 168 hours. Coagulation Profile: No results for input(s): INR, PROTIME in the last 168 hours. Cardiac Enzymes: No results for input(s): CKTOTAL, CKMB, CKMBINDEX, TROPONINI in the last 168 hours. BNP (last 3 results) No results for input(s): PROBNP in the last 8760 hours. HbA1C: No results for input(s): HGBA1C in the last 72 hours. CBG: Recent Labs  Lab 08/11/21 0635 08/11/21 1608  GLUCAP 101* 100*   Lipid Profile: No results for input(s): CHOL, HDL, LDLCALC, TRIG, CHOLHDL, LDLDIRECT in the last 72 hours. Thyroid Function Tests: No results for input(s): TSH, T4TOTAL, FREET4, T3FREE, THYROIDAB in the last 72 hours. Anemia Panel: No results for input(s): VITAMINB12, FOLATE, FERRITIN, TIBC, IRON, RETICCTPCT in the last 72 hours. Urine analysis:    Component Value Date/Time   COLORURINE YELLOW 06/22/2020 1728   APPEARANCEUR HAZY (A) 06/22/2020 1728   LABSPEC 1.018 06/22/2020 1728   PHURINE 5.0 06/22/2020 1728   GLUCOSEU NEGATIVE 06/22/2020 1728   HGBUR NEGATIVE 06/22/2020 1728   BILIRUBINUR NEGATIVE 06/22/2020 1728   KETONESUR NEGATIVE 06/22/2020 1728   PROTEINUR 30 (A) 06/22/2020 1728   UROBILINOGEN 0.2 01/06/2011 2117   NITRITE NEGATIVE  06/22/2020 1728   LEUKOCYTESUR NEGATIVE 06/22/2020 1728   Sepsis Labs: @LABRCNTIP (procalcitonin:4,lacticidven:4)  ) Recent Results (from the past 240 hour(s))  Resp Panel by RT-PCR (Flu A&B, Covid) Nasopharyngeal Swab     Status: None   Collection Time: 08/10/21 12:17 AM   Specimen: Nasopharyngeal Swab; Nasopharyngeal(NP) swabs in vial transport medium  Result Value Ref Range Status   SARS Coronavirus 2 by RT PCR NEGATIVE NEGATIVE Final    Comment: (NOTE) SARS-CoV-2 target nucleic acids are NOT DETECTED.  The SARS-CoV-2 RNA is generally detectable in upper respiratory specimens during the acute phase of infection. The lowest concentration of SARS-CoV-2 viral copies this assay can detect is 138 copies/mL. A negative result does not preclude SARS-Cov-2 infection and should not be used as the sole basis for treatment or other patient management decisions. A negative result may occur with  improper specimen collection/handling, submission of specimen other than nasopharyngeal swab, presence of viral mutation(s) within the areas targeted by this assay, and inadequate number of viral copies(<138 copies/mL). A negative  result must be combined with clinical observations, patient history, and epidemiological information. The expected result is Negative.  Fact Sheet for Patients:  EntrepreneurPulse.com.au  Fact Sheet for Healthcare Providers:  IncredibleEmployment.be  This test is no t yet approved or cleared by the [place] FDA and  has been authorized for detection and/or diagnosis of SARS-CoV-2 by FDA under an Emergency Use Authorization (EUA). This EUA will remain  in effect (meaning this test can be used) for the duration of the COVID-19 declaration under Section 564(b)(1) of the Act, 21 U.S.C.section 360bbb-3(b)(1), unless the authorization is terminated  or revoked sooner.       Influenza A by PCR NEGATIVE NEGATIVE Final   Influenza B  by PCR NEGATIVE NEGATIVE Final    Comment: (NOTE) The Xpert Xpress SARS-CoV-2/FLU/RSV plus assay is intended as an aid in the diagnosis of influenza from Nasopharyngeal swab specimens and should not be used as a sole basis for treatment. Nasal washings and aspirates are unacceptable for Xpert Xpress SARS-CoV-2/FLU/RSV testing.  Fact Sheet for Patients: EntrepreneurPulse.com.au  Fact Sheet for Healthcare Providers: IncredibleEmployment.be  This test is not yet approved or cleared by the [place] FDA and has been authorized for detection and/or diagnosis of SARS-CoV-2 by FDA under an Emergency Use Authorization (EUA). This EUA will remain in effect (meaning this test can be used) for the duration of the COVID-19 declaration under Section 564(b)(1) of the Act, 21 U.S.C. section 360bbb-3(b)(1), unless the authorization is terminated or revoked.  Performed at [facility], [place] [address] [place], [place] [address]      Radiology Studies: No results found.   Scheduled Meds:  apixaban  5 mg Oral BID   atorvastatin  80 mg Oral Daily   Chlorhexidine Gluconate Cloth  6 each Topical Daily   clopidogrel  75 mg Oral Daily   escitalopram  10 mg Oral Daily   ezetimibe  10 mg Oral Daily   furosemide  80 mg Intravenous BID   midodrine  10 mg Oral TID WC   sodium chloride flush  10-40 mL Intracatheter Q12H   Continuous Infusions:  amiodarone 30 mg/hr (08/16/21 1018)   DOBUTamine 2.5 mcg/kg/min (08/16/21 1019)     LOS: 6 days    Time spent: 52min   Domenic Polite, MD [facility]   08/16/2021, 11:25 AM

## 2021-08-16 NOTE — Plan of Care (Signed)
Problem: Activity: °Goal: Ability to tolerate increased activity will improve °Outcome: Completed/Met °  °Problem: Skin Integrity: °Goal: Risk for impaired skin integrity will decrease °Outcome: Completed/Met °  °Problem: Safety: °Goal: Ability to remain free from injury will improve °Outcome: Completed/Met °  °Problem: Pain Managment: °Goal: General experience of comfort will improve °Outcome: Completed/Met °  °Problem: Elimination: °Goal: Will not experience complications related to urinary retention °Outcome: Completed/Met °  °Problem: Elimination: °Goal: Will not experience complications related to bowel motility °Outcome: Completed/Met °  °Problem: Nutrition: °Goal: Adequate nutrition will be maintained °Outcome: Completed/Met °  °

## 2021-08-16 NOTE — Progress Notes (Addendum)
Advanced Heart Failure Rounding Note  PCP-Cardiologist: Sanda Klein, MD  AHFC: Dr. Haroldine Laws   Subjective:    Co-ox remains marginal, 59%, on DBA 2.5.   SCr continues to trend down slowly, 2.47>>2.44>>2.36 today. SBPs 90s, w/ midodrine.   4L in UOP yesterday. Wt down an additional 6 lb. CVP 8   K 3.4  Mg 2.3   Feels ok. No resting dyspnea. No chest pain. Main complaint is poor sleep.    Objective:   Weight Range: 80.7 kg Body mass index is 27.86 kg/m.   Vital Signs:   Temp:  [97.5 F (36.4 C)-98.2 F (36.8 C)] 97.5 F (36.4 C) (02/02 0342) Pulse Rate:  [53-91] 53 (02/01 1950) Resp:  [17-18] 17 (02/02 0342) BP: (87-101)/(64-87) 97/87 (02/02 0342) SpO2:  [96 %-100 %] 99 % (02/02 0342) Weight:  [80.7 kg] 80.7 kg (02/02 0342) Last BM Date: 08/15/21  Weight change: Filed Weights   08/14/21 0447 08/15/21 0502 08/16/21 0342  Weight: 85.8 kg 83.1 kg 80.7 kg    Intake/Output:   Intake/Output Summary (Last 24 hours) at 08/16/2021 0714 Last data filed at 08/16/2021 0553 Gross per 24 hour  Intake 1100.47 ml  Output 4000 ml  Net -2899.53 ml      Physical Exam    CVP 8  General:  Well appearing. No respiratory difficulty HEENT: normal Neck: supple. JVD 9 cm. Carotids 2+ bilat; no bruits. No lymphadenopathy or thyromegaly appreciated. Cor: PMI nondisplaced. Regular rate & rhythm. 3/6 MR murmur at apex  Lungs: clear, no wheezing  Abdomen: soft, nontender, nondistended. No hepatosplenomegaly. No bruits or masses. Good bowel sounds. Extremities: no cyanosis, clubbing, rash, edema + RUE PICC  Neuro: alert & oriented x 3, cranial nerves grossly intact. moves all 4 extremities w/o difficulty. Affect pleasant.   Telemetry   NSR 70s w/ PVCs ~10/min   Labs    CBC Recent Labs    08/14/21 0500 08/16/21 0510  WBC 9.6 9.9  HGB 12.8* 13.7  HCT 40.5 43.1  MCV 80.4 79.2*  PLT 287 681   Basic Metabolic Panel Recent Labs    08/15/21 0518 08/16/21 0510  NA  138 138  K 3.8 3.4*  CL 98 93*  CO2 30 34*  GLUCOSE 94 98  BUN 34* 33*  CREATININE 2.44* 2.36*  CALCIUM 8.6* 9.0  MG 1.9 2.3   Liver Function Tests No results for input(s): AST, ALT, ALKPHOS, BILITOT, PROT, ALBUMIN in the last 72 hours. No results for input(s): LIPASE, AMYLASE in the last 72 hours. Cardiac Enzymes No results for input(s): CKTOTAL, CKMB, CKMBINDEX, TROPONINI in the last 72 hours.  BNP: BNP (last 3 results) Recent Labs    08/10/21 0218  BNP 2,604.2*    ProBNP (last 3 results) No results for input(s): PROBNP in the last 8760 hours.   D-Dimer No results for input(s): DDIMER in the last 72 hours. Hemoglobin A1C No results for input(s): HGBA1C in the last 72 hours. Fasting Lipid Panel No results for input(s): CHOL, HDL, LDLCALC, TRIG, CHOLHDL, LDLDIRECT in the last 72 hours. Thyroid Function Tests No results for input(s): TSH, T4TOTAL, T3FREE, THYROIDAB in the last 72 hours.  Invalid input(s): FREET3  Other results:   Imaging    No results found.   Medications:     Scheduled Medications:  apixaban  5 mg Oral BID   atorvastatin  80 mg Oral Daily   Chlorhexidine Gluconate Cloth  6 each Topical Daily   clopidogrel  75 mg Oral  Daily   escitalopram  10 mg Oral Daily   ezetimibe  10 mg Oral Daily   furosemide  80 mg Intravenous BID   midodrine  5 mg Oral TID WC   sodium chloride flush  10-40 mL Intracatheter Q12H    Infusions:  amiodarone 30 mg/hr (08/16/21 0553)   DOBUTamine 2.5 mcg/kg/min (08/16/21 0553)    PRN Medications: acetaminophen **OR** acetaminophen, nitroGLYCERIN, nystatin, polyethylene glycol, sodium chloride flush, traZODone, trimethobenzamide    Patient Profile   66 y/o male with severe CAD s/p CABG 2014 with multiple subsequent MIs/PCIs., systolic HF due to iCM EF 20-25%, CKD 3B admitted with a/c systolic dysfunction with marked volume overload, worsening renal failure and low BP concerning for low output. Started on DBA.     Echo EF 20-25% Moderate RV dysfunction   Assessment/Plan   Acute on chronic systolic CHF with biventricular failure/ICM: - Echo 05/04/20 EF 25%. Moderate RV to severe RV dysfunction and possible LV thrombus.  - [place] 10/21 with preserved output and well compensated filling pressures.  - Echo 11/21 EF 25-30%,  - Admit with a/c CHF - has not been totally adherent with medical therapy - Echo 08/10/21: EF 20-25% Moderate RV dysfunction  - Developed hypotension and worsening renal function with attempts to diurese with IV lasix, concerning for low output, started on DBA 2.5 - Co-ox 59% today on DBA 2.5. - Diuresing well with IV lasix 80 BID. CVP 8. Continue IV Lasix today. Set up for RHC   - Increase midodrine to 10 mg TID to support BP - No VT on device interrogation  - Hold Farxiga for now with AKI and CKD - Off entresto and spiro with AKI and hypotension - Off beta blocker with a/c CHF - Plan for RHC later today or tomorrow. Suspect he is headed towards advanced therapies (VAD) if renal function improves. He is still contemplating if he would consider advanced therapies, remains full code.   2. CAD: - s/p CABG 2014 - Inferior STEMI 09/20 s/p DES SVG to RCA anastomosis - Inferior STEMI 05/03/20 s/p POBA SVG->PDA - Inferoposterior STEMI 09/22 >> thrombotic occlusion SVG to OM1 - On plavix. No ASA d/t anticoagulation.  - On atorvastatin 80 - Denies CP. HS trop low level and flat, 31>>32    3. Hx VT arrest/frequent PVCs: - S/p dual chamber ICD placement - No VT on tele nor device interrogation  - Frequent PVCs on tele.  C/w amio gtt at 30/hr while on DBA - Keep K > 4 and Mag > 2   4. Hx Bilateral PE/b/l DVT: - Anticoagulated with Eliquis   5. Possible LV thrombus: - Noted on echo 10/21, TEE 11/21 with no thrombus   6. AKI on CKD IIIb: - Baseline Scr 1.4, 1.87 on admit>>plateaued at 2.47>>2.44>>2.36today - suspect cardiorenal/low output - support CO w/ DBA, c/w diuresis  -  Increase midodrine to 10 tid to help w/ renal perfusion and keep SBPs > 110  - follow BMP   7. Hypokalemia - K 3.4  - Supp w/ KCl     Length of Stay: [address] PA-C  08/16/2021, 7:14 AM  Advanced Heart Failure Team Pager [phone] (M-F; 7a - 5p)  Please contact [facility] for night-coverage after hours (5p -7a ) and weekends on [email or web address]  Patient seen and examined with the above-signed Advanced Practice Provider and/or Housestaff. I personally reviewed laboratory data, imaging studies and relevant notes. I independently examined the patient and formulated the important aspects  of the plan. I have edited the note to reflect any of my changes or salient points. I have personally discussed the plan with the patient and/or family.  Remains on DBA. Co-ox 59% CVP 8. Has diuresed well Scr remains at 2.4   General:  Weak appearing. No resp difficulty HEENT: normal Neck: supple. JVP 7-8  Carotids 2+ bilat; no bruits. No lymphadenopathy or thryomegaly appreciated. Cor: PMI nondisplaced. Regular rate & rhythm. No rubs, gallops or murmurs. Lungs: clear Abdomen: soft, nontender, nondistended. No hepatosplenomegaly. No bruits or masses. Good bowel sounds. Extremities: no cyanosis, clubbing, rash, edema Neuro: alert & orientedx3, cranial nerves grossly intact. moves all 4 extremities w/o difficulty. Affect pleasant  He remains tenuous. Now with end-stage iCM. He was clear with me yesterday that he is not interested in advanced therapies and with his renal failure he would not be candidate.   I asked him to have his wife come in tomorrow for family meeting to discuss. Would hold off on RHC today. Will need to wean DBA soon.   Glori Bickers, MD  10:09 AM

## 2021-08-17 DIAGNOSIS — I5023 Acute on chronic systolic (congestive) heart failure: Secondary | ICD-10-CM | POA: Diagnosis not present

## 2021-08-17 LAB — BASIC METABOLIC PANEL
Anion gap: 15 (ref 5–15)
BUN: 31 mg/dL — ABNORMAL HIGH (ref 8–23)
CO2: 33 mmol/L — ABNORMAL HIGH (ref 22–32)
Calcium: 8.9 mg/dL (ref 8.9–10.3)
Chloride: 86 mmol/L — ABNORMAL LOW (ref 98–111)
Creatinine, Ser: 2.42 mg/dL — ABNORMAL HIGH (ref 0.61–1.24)
GFR, Estimated: 29 mL/min — ABNORMAL LOW (ref >60)
Glucose, Bld: 206 mg/dL — ABNORMAL HIGH (ref 70–99)
Potassium: 3.1 mmol/L — ABNORMAL LOW (ref 3.5–5.1)
Sodium: 134 mmol/L — ABNORMAL LOW (ref 135–145)

## 2021-08-17 LAB — COOXEMETRY PANEL
Carboxyhemoglobin: 1.4 % (ref 0.5–1.5)
Methemoglobin: 0.9 % (ref 0.0–1.5)
O2 Saturation: 62.1 %
Total hemoglobin: 14.3 g/dL (ref 12.0–16.0)

## 2021-08-17 MED ORDER — MIDODRINE HCL 5 MG PO TABS
15.0000 mg | ORAL_TABLET | Freq: Three times a day (TID) | ORAL | Status: DC
  Administered 2021-08-17 – 2021-08-21 (×12): 15 mg via ORAL
  Filled 2021-08-17 (×12): qty 3

## 2021-08-17 MED ORDER — TORSEMIDE 20 MG PO TABS
40.0000 mg | ORAL_TABLET | Freq: Every day | ORAL | Status: DC
  Administered 2021-08-18 – 2021-08-21 (×4): 40 mg via ORAL
  Filled 2021-08-17 (×4): qty 2

## 2021-08-17 MED ORDER — POTASSIUM CHLORIDE CRYS ER 20 MEQ PO TBCR
40.0000 meq | EXTENDED_RELEASE_TABLET | Freq: Two times a day (BID) | ORAL | Status: AC
  Administered 2021-08-17 (×2): 40 meq via ORAL
  Filled 2021-08-17 (×2): qty 2

## 2021-08-17 MED ORDER — AMIODARONE HCL 200 MG PO TABS
200.0000 mg | ORAL_TABLET | Freq: Two times a day (BID) | ORAL | Status: DC
  Administered 2021-08-17 – 2021-08-21 (×9): 200 mg via ORAL
  Filled 2021-08-17 (×9): qty 1

## 2021-08-17 NOTE — Care Management Important Message (Signed)
Important Message  Patient Details  Name: Noel Rodier MRN: [id number] Date of Birth: [birth year]   Medicare Important Message Given:  Yes     Shelda Altes 08/17/2021, 8:30 AM

## 2021-08-17 NOTE — Progress Notes (Addendum)
Advanced Heart Failure Rounding Note  PCP-Cardiologist: Sanda Klein, MD  AHFC: Dr. Haroldine Laws   Subjective:    Remains on DBA 2.5 mcg. CO-OX 62%.   Diuresing IV lasix. I/O not accurate. Weight down another 2 pounds.   Creatinine stable at 2.4   Feels ok. Denies SOB.  Objective:   Weight Range: 79.5 kg Body mass index is 27.44 kg/m.   Vital Signs:   Temp:  [97.7 F (36.5 C)-98.5 F (36.9 C)] 98.5 F (36.9 C) (02/03 0734) Pulse Rate:  [50-74] 60 (02/03 0734) Resp:  [15-19] 17 (02/03 0734) BP: (89-104)/(58-81) 96/66 (02/03 0734) SpO2:  [97 %-99 %] 97 % (02/03 0734) Weight:  [79.5 kg] 79.5 kg (02/03 0022) Last BM Date: 08/16/21  Weight change: Filed Weights   08/15/21 0502 08/16/21 0342 08/17/21 0022  Weight: 83.1 kg 80.7 kg 79.5 kg    Intake/Output:   Intake/Output Summary (Last 24 hours) at 08/17/2021 1011 Last data filed at 08/17/2021 0811 Gross per 24 hour  Intake 1069.94 ml  Output 2750 ml  Net -1680.06 ml      Physical Exam  CVP 5-6  General:   No resp difficulty HEENT: normal Neck: supple. no JVD. Carotids 2+ bilat; no bruits. No lymphadenopathy or thryomegaly appreciated. Cor: PMI nondisplaced. Regular rate & rhythm. No rubs, gallops or murmurs. Lungs: clear Abdomen: soft, nontender, nondistended. No hepatosplenomegaly. No bruits or masses. Good bowel sounds. Extremities: no cyanosis, clubbing, rash, edema. RUE PICC Neuro: alert & orientedx3, cranial nerves grossly intact. moves all 4 extremities w/o difficulty. Affect pleasant  Telemetry   NSR 60-70s PVCs   Labs    CBC Recent Labs    08/16/21 0510  WBC 9.9  HGB 13.7  HCT 43.1  MCV 79.2*  PLT 903   Basic Metabolic Panel Recent Labs    08/15/21 0518 08/16/21 0510 08/17/21 0538  NA 138 138 134*  K 3.8 3.4* 3.1*  CL 98 93* 86*  CO2 30 34* 33*  GLUCOSE 94 98 206*  BUN 34* 33* 31*  CREATININE 2.44* 2.36* 2.42*  CALCIUM 8.6* 9.0 8.9  MG 1.9 2.3  --    Liver Function  Tests No results for input(s): AST, ALT, ALKPHOS, BILITOT, PROT, ALBUMIN in the last 72 hours. No results for input(s): LIPASE, AMYLASE in the last 72 hours. Cardiac Enzymes No results for input(s): CKTOTAL, CKMB, CKMBINDEX, TROPONINI in the last 72 hours.  BNP: BNP (last 3 results) Recent Labs    08/10/21 0218  BNP 2,604.2*    ProBNP (last 3 results) No results for input(s): PROBNP in the last 8760 hours.   D-Dimer No results for input(s): DDIMER in the last 72 hours. Hemoglobin A1C No results for input(s): HGBA1C in the last 72 hours. Fasting Lipid Panel No results for input(s): CHOL, HDL, LDLCALC, TRIG, CHOLHDL, LDLDIRECT in the last 72 hours. Thyroid Function Tests No results for input(s): TSH, T4TOTAL, T3FREE, THYROIDAB in the last 72 hours.  Invalid input(s): FREET3  Other results:   Imaging    No results found.   Medications:     Scheduled Medications:  apixaban  5 mg Oral BID   atorvastatin  80 mg Oral Daily   Chlorhexidine Gluconate Cloth  6 each Topical Daily   clopidogrel  75 mg Oral Daily   escitalopram  10 mg Oral Daily   ezetimibe  10 mg Oral Daily   furosemide  80 mg Intravenous BID   midodrine  10 mg Oral TID WC  potassium chloride  40 mEq Oral BID   sodium chloride flush  10-40 mL Intracatheter Q12H    Infusions:  amiodarone 30 mg/hr (08/16/21 2153)   DOBUTamine 2.5 mcg/kg/min (08/16/21 1019)    PRN Medications: acetaminophen **OR** acetaminophen, nitroGLYCERIN, nystatin, polyethylene glycol, sodium chloride flush, traZODone, trimethobenzamide    Patient Profile   66 y/o male with severe CAD s/p CABG 2014 with multiple subsequent MIs/PCIs., systolic HF due to iCM EF 20-25%, CKD 3B admitted with a/c systolic dysfunction with marked volume overload, worsening renal failure and low BP concerning for low output. Started on DBA.    Echo EF 20-25% Moderate RV dysfunction   Assessment/Plan   Acute on chronic systolic CHF with  biventricular failure/ICM: - Echo 05/04/20 EF 25%. Moderate RV to severe RV dysfunction and possible LV thrombus.  - [place] 10/21 with preserved output and well compensated filling pressures.  - Echo 11/21 EF 25-30%,  - Admit with a/c CHF - has not been totally adherent with medical therapy - Echo 08/10/21: EF 20-25% Moderate RV dysfunction  - Developed hypotension and worsening renal function with attempts to diurese with IV lasix, concerning for low output, started on DBA 2.5 - Co-ox 62% today on DBA 2.5. - CVP down to 5-6. Stop IV lasix.   - Increase midodrine to 15 mg tid to support BP - No VT on device interrogation  - Hold Farxiga for now with AKI and CKD - Off entresto and spiro with AKI and hypotension - Off beta blocker with a/c CHF   2. CAD: - s/p CABG 2014 - Inferior STEMI 09/20 s/p DES SVG to RCA anastomosis - Inferior STEMI 05/03/20 s/p POBA SVG->PDA - Inferoposterior STEMI 09/22 >> thrombotic occlusion SVG to OM1 - No chest pain.  - On plavix. No ASA d/t anticoagulation.  - On atorvastatin 80 -HS trop low level and flat, 31>>32    3. Hx VT arrest/frequent PVCs: - S/p dual chamber ICD placement - No VT on tele nor device interrogation  - Frequent PVCs on tele.   -C/w amio gtt at 30/hr while on DBA - Keep K > 4 and Mag > 2 - SUpp K    4. Hx Bilateral PE/b/l DVT: - Anticoagulated with Eliquis   5. Possible LV thrombus: - Noted on echo 10/21, TEE 11/21 with no thrombus   6. AKI on CKD IIIb: - Baseline Scr 1.4, 1.87 on admit>>plateaued at 2.4 today - suspect cardiorenal/low output - support CO w/ DBA, c/w diuresis  - Continue  midodrine to 10 tid to help w/ renal perfusion and keep SBPs > 110    7. Hypokalemia - K 3.1  - Supp w/ KCl   8. DNR/DNI   GOC discussion today with Dr Haroldine Laws.     Length of Stay: 7  Amy Clegg, NP  08/17/2021, 10:11 AM  Advanced Heart Failure Team Pager [phone] (M-F; 7a - 5p)  Please contact [facility] for  night-coverage after hours (5p -7a ) and weekends on [email or web address]  Patient seen and examined with the above-signed Advanced Practice Provider and/or Housestaff. I personally reviewed laboratory data, imaging studies and relevant notes. I independently examined the patient and formulated the important aspects of the plan. I have edited the note to reflect any of my changes or salient points. I have personally discussed the plan with the patient and/or family.  Remains on DBA. He feels much better. Co-ox 62% CVP down. Scr stable.   General:  Sitting up in bed No resp  difficulty HEENT: normal Neck: supple. no JVD. Carotids 2+ bilat; no bruits. No lymphadenopathy or thryomegaly appreciated. Cor: PMI nondisplaced. Regular rate & rhythm. No rubs, gallops or murmurs. Lungs: clear Abdomen: soft, nontender, nondistended. No hepatosplenomegaly. No bruits or masses. Good bowel sounds. Extremities: no cyanosis, clubbing, rash, edema Neuro: alert & orientedx3, cranial nerves grossly intact. moves all 4 extremities w/o difficulty. Affect pleasant  I think this is about as good as we can get him. Long talk with him and his wife and he is not interested in advanced therapies (and likely wouldn't qualify with CKD IV).   Will stop IV lasix and dobutamine today. Will adjust oral regimen. Possible home Sunday/Monday.   Glori Bickers, MD  2:32 PM

## 2021-08-17 NOTE — Progress Notes (Signed)
Mobility Specialist Progress Note:   08/17/21 1414  Mobility  Activity Ambulated with assistance in hallway  Level of Assistance Independent  Assistive Device None  Distance Ambulated (ft) 470 ft  Activity Response Tolerated well  $Mobility charge 1 Mobility   Pt received in bed willing to participate in mobility. No complaints of pain and asymptomatic. Pt left EOB with call bell in reach and all needs met.   [facility] Public librarian Phone [phone] Secondary Phone [phone]

## 2021-08-17 NOTE — Progress Notes (Signed)
PROGRESS NOTE    [name]  QJJ:[id number] DOB: [birth year] DOA: 08/09/2021 PCP: [name], MD  Brief Narrative:[name] with history of CVA, right hemiparesis, CKD 3 AAA, chronic systolic CHF-EF 48-18%, h/o PE, thoracic aortic aneurysm, history of V. tach status post AICD presented to the ED with worsening dyspnea and edema.  Chest x-ray w/cardiomegaly,pulmonary vascular congestion.  -Admitted with biventricular heart failure, low output heart state, with worsening kidney function -Started on dobutamine, IV Lasix  Subjective: -Feels okay overall, breathing continues to get better, appetite is poor  Assessment & Plan:  * Acute on chronic systolic CHF (congestive heart failure) (HCC)- (present on admission) Biventricular heart failure, ischemic cardiomyopathy -EF 25% with moderate to severe RV dysfunction -Hospital course complicated by hypotension and worsening renal function, low output heart failure -Started on dobutamine, IV Lasix -Volume status improving, 13.6 L negative, creatinine is stable -Lasix discontinued today, plan to wean dobutamine gtt. -Continue high-dose midodrine -Off Entresto, Farxiga and Aldactone with AKI, hypotension -Per CHF team-plan for family meeting with wife today, patient refuses right heart cath  History of pulmonary embolus (PE) - on Eliquis- (present on admission) Bilateral DVT  -continue Eliquis  CAD, multiple vessel -> severe native vessel CAD-CABG 2014- (present on admission) On clopidogrel and statin therapy  -Beta-blocker on hold -No evidence of ACS  Acute renal failure superimposed on stage 3a chronic kidney disease (HCC) Cardiorenal syndrome  hyponatremia and hypokalemia -Creatinine slowly worsened, baseline creatinine around 1.4, trended up to 2.47, now plateauing -low output heart failure -Avoid hypotension, continue dobutamine  -Now on midodrine  -Creatinine slightly better, trend  History of ventricular tachycardia SP  AICD -Currently on amnio gtt. -Monitor electrolytes  Status post CVA - Continue apixaban and statin  Hyperlipidemia with target LDL less than 70- (present on admission) Continue with atorvastatin and ezetimibe   Essential hypertension- (present on admission) See discussion above  DNR (do not resuscitate) discussion-resolved as of 08/14/2021 CODE STATUS changed back to full code, discussion with Dr. Cathlean Sauer  DVT prophylaxis: Eliquis Code Status: Full code Family Communication: No family at bedside Disposition Plan:   Consultants:  Cardiology  Procedures:   Antimicrobials:    Objective: Vitals:   08/17/21 0734 08/17/21 0813 08/17/21 0825 08/17/21 1130  BP: 96/66 105/75  103/80  Pulse: 60   67  Resp: 17   17  Temp: 98.5 F (36.9 C)   97.9 F (36.6 C)  TempSrc: Oral   Oral  SpO2: 97%  95% 99%  Weight:      Height:        Intake/Output Summary (Last 24 hours) at 08/17/2021 1219 Last data filed at 08/17/2021 1053 Gross per 24 hour  Intake 1309.94 ml  Output 2775 ml  Net -1465.06 ml   Filed Weights   08/15/21 0502 08/16/21 0342 08/17/21 0022  Weight: 83.1 kg 80.7 kg 79.5 kg    Examination:  General exam: Pleasant male laying in bed, AAOx3, no distress CVS: S1-S2, regular rate rhythm Lungs: Clear bilaterally Abdomen: Soft, nontender, bowel sounds present Extremities: No edema, PICC line in right arm Skin: No rashes on exposed skin Skin: No rashes Psychiatry:  Mood & affect appropriate.     Data Reviewed:   CBC: Recent Labs  Lab 08/14/21 0500 08/16/21 0510  WBC 9.6 9.9  HGB 12.8* 13.7  HCT 40.5 43.1  MCV 80.4 79.2*  PLT 287 563   Basic Metabolic Panel: Recent Labs  Lab 08/11/21 0316 08/12/21 1632 08/13/21 0353 08/14/21 0500 08/15/21 0518  08/16/21 0510 08/17/21 0538  NA 138   < > 134* 138 138 138 134*  K 3.8   < > 5.0 3.8 3.8 3.4* 3.1*  CL 102   < > 99 100 98 93* 86*  CO2 28   < > 26 29 30  34* 33*  GLUCOSE 93   < > 94 87 94 98 206*  BUN  36*   < > 42* 40* 34* 33* 31*  CREATININE 1.91*   < > 2.35* 2.47* 2.44* 2.36* 2.42*  CALCIUM 8.1*   < > 8.7* 8.6* 8.6* 9.0 8.9  MG 2.1  --  2.1  --  1.9 2.3  --    < > = values in this interval not displayed.   GFR: Estimated Creatinine Clearance: 30 mL/min (A) (by C-G formula based on SCr of 2.42 mg/dL (H)). Liver Function Tests: No results for input(s): AST, ALT, ALKPHOS, BILITOT, PROT, ALBUMIN in the last 168 hours. No results for input(s): LIPASE, AMYLASE in the last 168 hours. No results for input(s): AMMONIA in the last 168 hours. Coagulation Profile: No results for input(s): INR, PROTIME in the last 168 hours. Cardiac Enzymes: No results for input(s): CKTOTAL, CKMB, CKMBINDEX, TROPONINI in the last 168 hours. BNP (last 3 results) No results for input(s): PROBNP in the last 8760 hours. HbA1C: No results for input(s): HGBA1C in the last 72 hours. CBG: Recent Labs  Lab 08/11/21 0635 08/11/21 1608  GLUCAP 101* 100*   Lipid Profile: No results for input(s): CHOL, HDL, LDLCALC, TRIG, CHOLHDL, LDLDIRECT in the last 72 hours. Thyroid Function Tests: No results for input(s): TSH, T4TOTAL, FREET4, T3FREE, THYROIDAB in the last 72 hours. Anemia Panel: No results for input(s): VITAMINB12, FOLATE, FERRITIN, TIBC, IRON, RETICCTPCT in the last 72 hours. Urine analysis:    Component Value Date/Time   COLORURINE YELLOW 06/22/2020 1728   APPEARANCEUR HAZY (A) 06/22/2020 1728   LABSPEC 1.018 06/22/2020 1728   PHURINE 5.0 06/22/2020 1728   GLUCOSEU NEGATIVE 06/22/2020 1728   HGBUR NEGATIVE 06/22/2020 1728   BILIRUBINUR NEGATIVE 06/22/2020 1728   KETONESUR NEGATIVE 06/22/2020 1728   PROTEINUR 30 (A) 06/22/2020 1728   UROBILINOGEN 0.2 01/06/2011 2117   NITRITE NEGATIVE 06/22/2020 1728   LEUKOCYTESUR NEGATIVE 06/22/2020 1728   Sepsis Labs: @LABRCNTIP (procalcitonin:4,lacticidven:4)  ) Recent Results (from the past 240 hour(s))  Resp Panel by RT-PCR (Flu A&B, Covid) Nasopharyngeal  Swab     Status: None   Collection Time: 08/10/21 12:17 AM   Specimen: Nasopharyngeal Swab; Nasopharyngeal(NP) swabs in vial transport medium  Result Value Ref Range Status   SARS Coronavirus 2 by RT PCR NEGATIVE NEGATIVE Final    Comment: (NOTE) SARS-CoV-2 target nucleic acids are NOT DETECTED.  The SARS-CoV-2 RNA is generally detectable in upper respiratory specimens during the acute phase of infection. The lowest concentration of SARS-CoV-2 viral copies this assay can detect is 138 copies/mL. A negative result does not preclude SARS-Cov-2 infection and should not be used as the sole basis for treatment or other patient management decisions. A negative result may occur with  improper specimen collection/handling, submission of specimen other than nasopharyngeal swab, presence of viral mutation(s) within the areas targeted by this assay, and inadequate number of viral copies(<138 copies/mL). A negative result must be combined with clinical observations, patient history, and epidemiological information. The expected result is Negative.  Fact Sheet for Patients:  EntrepreneurPulse.com.au  Fact Sheet for Healthcare Providers:  IncredibleEmployment.be  This test is no t yet approved or cleared by the  [place] Architectural technologist and  has been authorized for detection and/or diagnosis of SARS-CoV-2 by FDA under an Print production planner (EUA). This EUA will remain  in effect (meaning this test can be used) for the duration of the COVID-19 declaration under Section 564(b)(1) of the Act, 21 U.S.C.section 360bbb-3(b)(1), unless the authorization is terminated  or revoked sooner.       Influenza A by PCR NEGATIVE NEGATIVE Final   Influenza B by PCR NEGATIVE NEGATIVE Final    Comment: (NOTE) The Xpert Xpress SARS-CoV-2/FLU/RSV plus assay is intended as an aid in the diagnosis of influenza from Nasopharyngeal swab specimens and should not be used as a sole  basis for treatment. Nasal washings and aspirates are unacceptable for Xpert Xpress SARS-CoV-2/FLU/RSV testing.  Fact Sheet for Patients: EntrepreneurPulse.com.au  Fact Sheet for Healthcare Providers: IncredibleEmployment.be  This test is not yet approved or cleared by the [place] FDA and has been authorized for detection and/or diagnosis of SARS-CoV-2 by FDA under an Emergency Use Authorization (EUA). This EUA will remain in effect (meaning this test can be used) for the duration of the COVID-19 declaration under Section 564(b)(1) of the Act, 21 U.S.C. section 360bbb-3(b)(1), unless the authorization is terminated or revoked.  Performed at [facility], [place] [address], [place], [place] [address]      Radiology Studies: No results found.   Scheduled Meds:  apixaban  5 mg Oral BID   atorvastatin  80 mg Oral Daily   Chlorhexidine Gluconate Cloth  6 each Topical Daily   clopidogrel  75 mg Oral Daily   escitalopram  10 mg Oral Daily   ezetimibe  10 mg Oral Daily   midodrine  15 mg Oral TID WC   potassium chloride  40 mEq Oral BID   sodium chloride flush  10-40 mL Intracatheter Q12H   Continuous Infusions:  amiodarone 30 mg/hr (08/17/21 1052)   DOBUTamine 2.5 mcg/kg/min (08/16/21 1019)     LOS: 7 days    Time spent: 30min   Domenic Polite, MD [facility]   08/17/2021, 12:19 PM

## 2021-08-18 DIAGNOSIS — I5023 Acute on chronic systolic (congestive) heart failure: Secondary | ICD-10-CM | POA: Diagnosis not present

## 2021-08-18 LAB — BASIC METABOLIC PANEL
Anion gap: 12 (ref 5–15)
BUN: 35 mg/dL — ABNORMAL HIGH (ref 8–23)
CO2: 32 mmol/L (ref 22–32)
Calcium: 8.9 mg/dL (ref 8.9–10.3)
Chloride: 92 mmol/L — ABNORMAL LOW (ref 98–111)
Creatinine, Ser: 2.53 mg/dL — ABNORMAL HIGH (ref 0.61–1.24)
GFR, Estimated: 27 mL/min — ABNORMAL LOW (ref >60)
Glucose, Bld: 159 mg/dL — ABNORMAL HIGH (ref 70–99)
Potassium: 3.6 mmol/L (ref 3.5–5.1)
Sodium: 136 mmol/L (ref 135–145)

## 2021-08-18 LAB — COOXEMETRY PANEL
Carboxyhemoglobin: 1.2 % (ref 0.5–1.5)
Methemoglobin: 0.9 % (ref 0.0–1.5)
O2 Saturation: 51.4 %
Total hemoglobin: 14.7 g/dL (ref 12.0–16.0)

## 2021-08-18 MED ORDER — LOPERAMIDE HCL 2 MG PO CAPS
2.0000 mg | ORAL_CAPSULE | Freq: Two times a day (BID) | ORAL | Status: DC | PRN
  Administered 2021-08-18 – 2021-08-20 (×2): 2 mg via ORAL
  Filled 2021-08-18 (×2): qty 1

## 2021-08-18 MED ORDER — POTASSIUM CHLORIDE CRYS ER 20 MEQ PO TBCR
20.0000 meq | EXTENDED_RELEASE_TABLET | Freq: Once | ORAL | Status: AC
  Administered 2021-08-18: 20 meq via ORAL
  Filled 2021-08-18: qty 1

## 2021-08-18 MED ORDER — ALUM & MAG HYDROXIDE-SIMETH 200-200-20 MG/5ML PO SUSP
15.0000 mL | Freq: Four times a day (QID) | ORAL | Status: DC | PRN
  Administered 2021-08-18 (×2): 15 mL via ORAL
  Filled 2021-08-18 (×2): qty 30

## 2021-08-18 NOTE — Progress Notes (Signed)
Advanced Heart Failure Rounding Note  PCP-Cardiologist: Sanda Klein, MD  AHFC: Dr. Haroldine Laws   Subjective:    Dobutamine stopped yesterday  CO-OX 62% -> 52%.   Off lasix. Weight stable. Feels ok. Denies SOB, orthopnea or PND  Scr 2.3 -> 2.4 -> 2.5  Objective:   Weight Range: 79.3 kg Body mass index is 27.38 kg/m.   Vital Signs:   Temp:  [97.6 F (36.4 C)-98 F (36.7 C)] 97.9 F (36.6 C) (02/04 1057) Pulse Rate:  [39-76] 49 (02/04 1057) Resp:  [17-18] 18 (02/04 1057) BP: (98-111)/(77-92) 103/92 (02/04 1057) SpO2:  [96 %-100 %] 96 % (02/04 1057) Weight:  [79.3 kg] 79.3 kg (02/04 0041) Last BM Date: 08/18/21  Weight change: Filed Weights   08/16/21 0342 08/17/21 0022 08/18/21 0041  Weight: 80.7 kg 79.5 kg 79.3 kg    Intake/Output:   Intake/Output Summary (Last 24 hours) at 08/18/2021 1338 Last data filed at 08/18/2021 1236 Gross per 24 hour  Intake 592 ml  Output 450 ml  Net 142 ml       Physical Exam   General:  Well appearing. No resp difficulty HEENT: normal Neck: supple. no JVD. Carotids 2+ bilat; no bruits. No lymphadenopathy or thryomegaly appreciated. Cor: PMI nondisplaced. Regular rate & rhythm. No rubs, gallops or murmurs. Lungs: clear Abdomen: soft, nontender, nondistended. No hepatosplenomegaly. No bruits or masses. Good bowel sounds. Extremities: no cyanosis, clubbing, rash, edema Neuro: alert & orientedx3, cranial nerves grossly intact. moves all 4 extremities w/o difficulty. Affect pleasant   Telemetry   NSR 60-70s Personally reviewed  Labs    CBC Recent Labs    08/16/21 0510  WBC 9.9  HGB 13.7  HCT 43.1  MCV 79.2*  PLT 532    Basic Metabolic Panel Recent Labs    08/16/21 0510 08/17/21 0538 08/18/21 0515  NA 138 134* 136  K 3.4* 3.1* 3.6  CL 93* 86* 92*  CO2 34* 33* 32  GLUCOSE 98 206* 159*  BUN 33* 31* 35*  CREATININE 2.36* 2.42* 2.53*  CALCIUM 9.0 8.9 8.9  MG 2.3  --   --     Liver Function Tests No  results for input(s): AST, ALT, ALKPHOS, BILITOT, PROT, ALBUMIN in the last 72 hours. No results for input(s): LIPASE, AMYLASE in the last 72 hours. Cardiac Enzymes No results for input(s): CKTOTAL, CKMB, CKMBINDEX, TROPONINI in the last 72 hours.  BNP: BNP (last 3 results) Recent Labs    08/10/21 0218  BNP 2,604.2*     ProBNP (last 3 results) No results for input(s): PROBNP in the last 8760 hours.   D-Dimer No results for input(s): DDIMER in the last 72 hours. Hemoglobin A1C No results for input(s): HGBA1C in the last 72 hours. Fasting Lipid Panel No results for input(s): CHOL, HDL, LDLCALC, TRIG, CHOLHDL, LDLDIRECT in the last 72 hours. Thyroid Function Tests No results for input(s): TSH, T4TOTAL, T3FREE, THYROIDAB in the last 72 hours.  Invalid input(s): FREET3  Other results:   Imaging    No results found.   Medications:     Scheduled Medications:  amiodarone  200 mg Oral BID   apixaban  5 mg Oral BID   atorvastatin  80 mg Oral Daily   Chlorhexidine Gluconate Cloth  6 each Topical Daily   clopidogrel  75 mg Oral Daily   escitalopram  10 mg Oral Daily   ezetimibe  10 mg Oral Daily   midodrine  15 mg Oral TID WC  sodium chloride flush  10-40 mL Intracatheter Q12H   torsemide  40 mg Oral Daily    Infusions:    PRN Medications: acetaminophen **OR** acetaminophen, alum & mag hydroxide-simeth, nitroGLYCERIN, nystatin, polyethylene glycol, sodium chloride flush, traZODone, trimethobenzamide    Patient Profile   66 y/o male with severe CAD s/p CABG 2014 with multiple subsequent MIs/PCIs., systolic HF due to iCM EF 20-25%, CKD 3B admitted with a/c systolic dysfunction with marked volume overload, worsening renal failure and low BP concerning for low output. Started on DBA.    Echo EF 20-25% Moderate RV dysfunction   Assessment/Plan   Acute on chronic systolic CHF with biventricular failure/ICM: - Echo 05/04/20 EF 25%. Moderate RV to severe RV  dysfunction and possible LV thrombus.  - [place] 10/21 with preserved output and well compensated filling pressures.  - Echo 11/21 EF 25-30%,  - Admit with a/c CHF - has not been totally adherent with medical therapy - Echo 08/10/21: EF 20-25% Moderate RV dysfunction  - Developed hypotension and worsening renal function with attempts to diurese with IV lasix, concerning for low output, started on DBA 2.5 - DBA stopped yesterday. Co-ox 62%-> 52%5. - CVP stable 5-6. Off lasix - Continue midodrine to 15 mg tid to support BP - Off entresto and spiro with AKI and hypotension - Off beta blocker with a/c CHF - He has end-stage HF due to iCM. Co-ox back down after stopping DBA. Will continue to follow. Long talk with him and his wife yesterday. He is not interested in advanced therapies or home inotropes. If co-ox remains low, may need Hospice involvement. If co-ox improved, may be able to go home tomorrow.    2. CAD: - s/p CABG 2014 - Inferior STEMI 09/20 s/p DES SVG to RCA anastomosis - Inferior STEMI 05/03/20 s/p POBA SVG->PDA - Inferoposterior STEMI 09/22 >> thrombotic occlusion SVG to OM1 - No s/s angina - On plavix. No ASA d/t anticoagulation.  - On atorvastatin 80 -HS trop low level and flat, 31>>32    3. Hx VT arrest/frequent PVCs: - S/p dual chamber ICD placement - No VT on tele nor device interrogation  - Frequent PVCs on tele.   -C/w amio gtt at 30/hr while on DBA - Keep K > 4 and Mag > 2 - Supp K   4. Hx Bilateral PE/b/l DVT: - Anticoagulated with Eliquis   5. Possible LV thrombus: - Noted on echo 10/21, TEE 11/21 with no thrombus   6. AKI on CKD IIIb: - Baseline Scr 1.4, 1.87 on admit>>plateaued at 2.4 today - suspect cardiorenal/low output - support CO w/ DBA, c/w diuresis  - Continue  midodrine to 10 tid to help w/ renal perfusion and keep SBPs > 110    7. Hypokalemia - K 3.6 - Supp w/ KCl   8. DNR/DNI    Length of Stay: [place], MD  08/18/2021, 1:38  PM  Advanced Heart Failure Team Pager [phone] (M-F; 7a - 5p)  Please contact [facility] for night-coverage after hours (5p -7a ) and weekends on [email or web address]

## 2021-08-18 NOTE — Progress Notes (Signed)
PROGRESS NOTE    [name]  LOV:[id number] DOB: 05-09-1956 DOA: 08/09/2021 PCP: [name], MD  Brief Narrative:[name] with history of CVA, right hemiparesis, CKD 3 AAA, chronic systolic CHF-EF 88-41%, h/o PE, thoracic aortic aneurysm, history of V. tach status post AICD presented to the ED with worsening dyspnea and edema.  Chest x-ray w/cardiomegaly,pulmonary vascular congestion.  -Admitted with biventricular heart failure, low output heart state, with worsening kidney function -Started on dobutamine, IV Lasix-improved with diuresis  Subjective: -No events overnight, denies any shortness of breath  Assessment & Plan:  * Acute on chronic systolic CHF (congestive heart failure) (HCC)- (present on admission) Biventricular heart failure, ischemic cardiomyopathy -EF 25% with moderate to severe RV dysfunction -Hospital course complicated by hypotension and worsening renal function, low output heart failure -Started on dobutamine, IV Lasix -Volume status improving, 14.3 L-, mild bump in creatinine noted -CVP down to 6/7, Lasix discontinued yesterday, off dobutamine -Continue high-dose midodrine -Off Entresto, Farxiga and Aldactone with AKI, hypotension -Had meeting with wife and Dr. Haroldine Laws yesterday, refuses right heart cath  History of pulmonary embolus (PE) - on Eliquis- (present on admission) Bilateral DVT  -continue Eliquis  CAD, multiple vessel -> severe native vessel CAD-CABG 2014- (present on admission) On clopidogrel and statin therapy  -Beta-blocker on hold -No evidence of ACS  Acute renal failure superimposed on stage 3a chronic kidney disease ([place])- (present on admission) Cardiorenal syndrome  hyponatremia and hypokalemia -Creatinine slowly worsened, baseline creatinine around 1.4,, trended up to 2.4, was improving, now back to 2.5, -low output heart failure -Lasix and dobutamine discontinued yesterday  -Continue midodrine  History of ventricular  tachycardia SP AICD -Currently on amnio gtt. -Monitor electrolytes  Status post CVA - Continue apixaban and statin  Hyperlipidemia with target LDL less than 70- (present on admission) Continue with atorvastatin and ezetimibe   Essential hypertension- (present on admission) See discussion above  DNR (do not resuscitate) discussion-resolved as of 08/14/2021 CODE STATUS changed back to full code, discussion with Dr. Cathlean Sauer  DVT prophylaxis: Eliquis Code Status: Full code Family Communication: No family at bedside Disposition Plan: Home in 1 to 2 days if creatinine is stable  Consultants:  Cardiology  Procedures:   Antimicrobials:    Objective: Vitals:   08/18/21 0041 08/18/21 0045 08/18/21 0349 08/18/21 0827  BP:  98/79 105/81 111/83  Pulse:  76 (!) 39 (!) 42  Resp:   17 18  Temp:  97.6 F (36.4 C) 98 F (36.7 C) 97.6 F (36.4 C)  TempSrc:   Oral Oral  SpO2:   98% 100%  Weight: 79.3 kg     Height:        Intake/Output Summary (Last 24 hours) at 08/18/2021 1050 Last data filed at 08/18/2021 6606 Gross per 24 hour  Intake 951 ml  Output 1150 ml  Net -199 ml   Filed Weights   08/16/21 0342 08/17/21 0022 08/18/21 0041  Weight: 80.7 kg 79.5 kg 79.3 kg    Examination:  General exam: Pleasant male laying in bed, AAOx3, no distress CVS: S1-S2, regular rate rhythm Lungs: Clear bilaterally Abdomen: Soft, nontender, bowel sounds present Extremities: No edema, PICC line noted in right arm Skin: No rashes on exposed skin Skin: No rashes Psychiatry:  Mood & affect appropriate.     Data Reviewed:   CBC: Recent Labs  Lab 08/14/21 0500 08/16/21 0510  WBC 9.6 9.9  HGB 12.8* 13.7  HCT 40.5 43.1  MCV 80.4 79.2*  PLT 287 354  Basic Metabolic Panel: Recent Labs  Lab 08/13/21 0353 08/14/21 0500 08/15/21 0518 08/16/21 0510 08/17/21 0538 08/18/21 0515  NA 134* 138 138 138 134* 136  K 5.0 3.8 3.8 3.4* 3.1* 3.6  CL 99 100 98 93* 86* 92*  CO2 26 29 30  34*  33* 32  GLUCOSE 94 87 94 98 206* 159*  BUN 42* 40* 34* 33* 31* 35*  CREATININE 2.35* 2.47* 2.44* 2.36* 2.42* 2.53*  CALCIUM 8.7* 8.6* 8.6* 9.0 8.9 8.9  MG 2.1  --  1.9 2.3  --   --    GFR: Estimated Creatinine Clearance: 26.5 mL/min (A) (by C-G formula based on SCr of 2.53 mg/dL (H)). Liver Function Tests: No results for input(s): AST, ALT, ALKPHOS, BILITOT, PROT, ALBUMIN in the last 168 hours. No results for input(s): LIPASE, AMYLASE in the last 168 hours. No results for input(s): AMMONIA in the last 168 hours. Coagulation Profile: No results for input(s): INR, PROTIME in the last 168 hours. Cardiac Enzymes: No results for input(s): CKTOTAL, CKMB, CKMBINDEX, TROPONINI in the last 168 hours. BNP (last 3 results) No results for input(s): PROBNP in the last 8760 hours. HbA1C: No results for input(s): HGBA1C in the last 72 hours. CBG: Recent Labs  Lab 08/11/21 1608  GLUCAP 100*   Lipid Profile: No results for input(s): CHOL, HDL, LDLCALC, TRIG, CHOLHDL, LDLDIRECT in the last 72 hours. Thyroid Function Tests: No results for input(s): TSH, T4TOTAL, FREET4, T3FREE, THYROIDAB in the last 72 hours. Anemia Panel: No results for input(s): VITAMINB12, FOLATE, FERRITIN, TIBC, IRON, RETICCTPCT in the last 72 hours. Urine analysis:    Component Value Date/Time   COLORURINE YELLOW 06/22/2020 1728   APPEARANCEUR HAZY (A) 06/22/2020 1728   LABSPEC 1.018 06/22/2020 1728   PHURINE 5.0 06/22/2020 1728   GLUCOSEU NEGATIVE 06/22/2020 1728   HGBUR NEGATIVE 06/22/2020 1728   BILIRUBINUR NEGATIVE 06/22/2020 1728   KETONESUR NEGATIVE 06/22/2020 1728   PROTEINUR 30 (A) 06/22/2020 1728   UROBILINOGEN 0.2 01/06/2011 2117   NITRITE NEGATIVE 06/22/2020 1728   LEUKOCYTESUR NEGATIVE 06/22/2020 1728   Sepsis Labs: @LABRCNTIP (procalcitonin:4,lacticidven:4)  ) Recent Results (from the past 240 hour(s))  Resp Panel by RT-PCR (Flu A&B, Covid) Nasopharyngeal Swab     Status: None   Collection Time:  08/10/21 12:17 AM   Specimen: Nasopharyngeal Swab; Nasopharyngeal(NP) swabs in vial transport medium  Result Value Ref Range Status   SARS Coronavirus 2 by RT PCR NEGATIVE NEGATIVE Final    Comment: (NOTE) SARS-CoV-2 target nucleic acids are NOT DETECTED.  The SARS-CoV-2 RNA is generally detectable in upper respiratory specimens during the acute phase of infection. The lowest concentration of SARS-CoV-2 viral copies this assay can detect is 138 copies/mL. A negative result does not preclude SARS-Cov-2 infection and should not be used as the sole basis for treatment or other patient management decisions. A negative result may occur with  improper specimen collection/handling, submission of specimen other than nasopharyngeal swab, presence of viral mutation(s) within the areas targeted by this assay, and inadequate number of viral copies(<138 copies/mL). A negative result must be combined with clinical observations, patient history, and epidemiological information. The expected result is Negative.  Fact Sheet for Patients:  EntrepreneurPulse.com.au  Fact Sheet for Healthcare Providers:  IncredibleEmployment.be  This test is no t yet approved or cleared by the [place] FDA and  has been authorized for detection and/or diagnosis of SARS-CoV-2 by FDA under an Emergency Use Authorization (EUA). This EUA will remain  in effect (meaning this test  can be used) for the duration of the COVID-19 declaration under Section 564(b)(1) of the Act, 21 U.S.C.section 360bbb-3(b)(1), unless the authorization is terminated  or revoked sooner.       Influenza A by PCR NEGATIVE NEGATIVE Final   Influenza B by PCR NEGATIVE NEGATIVE Final    Comment: (NOTE) The Xpert Xpress SARS-CoV-2/FLU/RSV plus assay is intended as an aid in the diagnosis of influenza from Nasopharyngeal swab specimens and should not be used as a sole basis for treatment. Nasal washings  and aspirates are unacceptable for Xpert Xpress SARS-CoV-2/FLU/RSV testing.  Fact Sheet for Patients: EntrepreneurPulse.com.au  Fact Sheet for Healthcare Providers: IncredibleEmployment.be  This test is not yet approved or cleared by the [place] FDA and has been authorized for detection and/or diagnosis of SARS-CoV-2 by FDA under an Emergency Use Authorization (EUA). This EUA will remain in effect (meaning this test can be used) for the duration of the COVID-19 declaration under Section 564(b)(1) of the Act, 21 U.S.C. section 360bbb-3(b)(1), unless the authorization is terminated or revoked.  Performed at [facility], [place] [address] [place], [place] [address]      Radiology Studies: No results found.   Scheduled Meds:  amiodarone  200 mg Oral BID   apixaban  5 mg Oral BID   atorvastatin  80 mg Oral Daily   Chlorhexidine Gluconate Cloth  6 each Topical Daily   clopidogrel  75 mg Oral Daily   escitalopram  10 mg Oral Daily   ezetimibe  10 mg Oral Daily   midodrine  15 mg Oral TID WC   sodium chloride flush  10-40 mL Intracatheter Q12H   torsemide  40 mg Oral Daily   Continuous Infusions:     LOS: 8 days    Time spent: 55min   Domenic Polite, MD [facility]   08/18/2021, 10:50 AM

## 2021-08-18 NOTE — Significant Event (Signed)
Pt explaining several loose stools with upset stomach. Gave PRN Maalox.

## 2021-08-18 NOTE — Plan of Care (Signed)
°  Problem: Education: Goal: Ability to demonstrate management of disease process will improve Outcome: Progressing Goal: Ability to verbalize understanding of medication therapies will improve Outcome: Progressing Goal: Individualized Educational Video(s) Outcome: Progressing   Problem: Cardiac: Goal: Ability to achieve and maintain adequate cardiopulmonary perfusion will improve Outcome: Progressing   Problem: Education: Goal: Knowledge of General Education information will improve Description: Including pain rating scale, medication(s)/side effects and non-pharmacologic comfort measures Outcome: Progressing   Problem: Health Behavior/Discharge Planning: Goal: Ability to manage health-related needs will improve Outcome: Progressing   Problem: Clinical Measurements: Goal: Ability to maintain clinical measurements within normal limits will improve Outcome: Progressing Goal: Will remain free from infection Outcome: Progressing Goal: Diagnostic test results will improve Outcome: Progressing Goal: Respiratory complications will improve Outcome: Progressing Goal: Cardiovascular complication will be avoided Outcome: Progressing   Problem: Activity: Goal: Risk for activity intolerance will decrease Outcome: Progressing   Problem: Coping: Goal: Level of anxiety will decrease Outcome: Progressing   Problem: Education: Goal: Knowledge of disease or condition will improve Outcome: Progressing Goal: Understanding of medication regimen will improve Outcome: Progressing Goal: Individualized Educational Video(s) Outcome: Progressing   Problem: Cardiac: Goal: Ability to achieve and maintain adequate cardiopulmonary perfusion will improve Outcome: Progressing   Problem: Health Behavior/Discharge Planning: Goal: Ability to safely manage health-related needs after discharge will improve Outcome: Progressing

## 2021-08-19 DIAGNOSIS — I5023 Acute on chronic systolic (congestive) heart failure: Secondary | ICD-10-CM | POA: Diagnosis not present

## 2021-08-19 LAB — BASIC METABOLIC PANEL
Anion gap: 14 (ref 5–15)
BUN: 43 mg/dL — ABNORMAL HIGH (ref 8–23)
CO2: 29 mmol/L (ref 22–32)
Calcium: 8.9 mg/dL (ref 8.9–10.3)
Chloride: 92 mmol/L — ABNORMAL LOW (ref 98–111)
Creatinine, Ser: 3.15 mg/dL — ABNORMAL HIGH (ref 0.61–1.24)
GFR, Estimated: 21 mL/min — ABNORMAL LOW (ref >60)
Glucose, Bld: 95 mg/dL (ref 70–99)
Potassium: 3.5 mmol/L (ref 3.5–5.1)
Sodium: 135 mmol/L (ref 135–145)

## 2021-08-19 LAB — COOXEMETRY PANEL
Carboxyhemoglobin: 1.5 % (ref 0.5–1.5)
Methemoglobin: 0.7 % (ref 0.0–1.5)
O2 Saturation: 55.3 %
Total hemoglobin: 14.1 g/dL (ref 12.0–16.0)

## 2021-08-19 MED ORDER — POTASSIUM CHLORIDE ER 10 MEQ PO TBCR
20.0000 meq | EXTENDED_RELEASE_TABLET | Freq: Every day | ORAL | Status: DC
  Administered 2021-08-19 – 2021-08-20 (×2): 20 meq via ORAL
  Filled 2021-08-19 (×5): qty 2

## 2021-08-19 NOTE — Progress Notes (Signed)
PROGRESS NOTE    [name]  VOH:[id number] DOB: 1955-08-30 DOA: 08/09/2021 PCP: [name], MD  Brief Narrative:[name] with history of CVA, right hemiparesis, CKD 3 AAA, chronic systolic CHF-EF 69-48%, h/o PE, thoracic aortic aneurysm, history of V. tach status post AICD presented to the ED with worsening dyspnea and edema.  Chest x-ray w/cardiomegaly,pulmonary vascular congestion.  -Admitted with biventricular heart failure, low output heart state, with worsening kidney function -Started on dobutamine, IV Lasix-improved with diuresis -Improved, dobutamine and Lasix discontinued -Mild worsening kidney function  Subjective: -Had some abdominal cramps last night and diarrhea, now better, denies any shortness of breath or dizziness  Assessment & Plan:  * Acute on chronic systolic CHF (congestive heart failure) ([place])- (present on admission) Biventricular heart failure, ischemic cardiomyopathy -EF 25% with moderate to severe RV dysfunction -Hospital course complicated by hypotension and worsening renal function, low output heart failure -Started on dobutamine, IV Lasix -Volume status improving, 14.3 L- -CVP down to 6/7, Lasix discontinued 2/3, off dobutamine -Continue high-dose midodrine -Off Entresto, Farxiga and Aldactone with AKI, hypotension -Unfortunately mild worsening kidney function, continue to monitor  History of pulmonary embolus (PE) - on Eliquis- (present on admission) Bilateral DVT  -continue Eliquis  CAD, multiple vessel -> severe native vessel CAD-CABG 2014- (present on admission) On clopidogrel and statin therapy  -Beta-blocker on hold -No evidence of ACS  Acute renal failure superimposed on stage 3a chronic kidney disease ([place])- (present on admission) Cardiorenal syndrome  hyponatremia and hypokalemia -Creatinine slowly worsened, baseline creatinine around 1.4, -low output heart failure -Creatinine trending up to 3.1 today -Lasix and dobutamine  discontinued, Continue midodrine -Avoid hypotension, may need gentle IVF if CVP drops further  History of ventricular tachycardia SP AICD -Currently on amnio gtt. -Monitor electrolytes  Status post CVA - Continue apixaban and statin  Hyperlipidemia with target LDL less than 70- (present on admission) Continue with atorvastatin and ezetimibe   Essential hypertension- (present on admission) See discussion above  DNR (do not resuscitate) discussion-resolved as of 08/14/2021 CODE STATUS changed back to full code, discussion with Dr. Cathlean Sauer  DVT prophylaxis: Eliquis Code Status: Full code Family Communication: No family at bedside Disposition Plan: Home in 1 to 2 days if creatinine is stable  Consultants:  Cardiology  Procedures:   Antimicrobials:    Objective: Vitals:   08/19/21 0004 08/19/21 0339 08/19/21 0731 08/19/21 1132  BP: (!) 120/92 113/82 117/75 98/75  Pulse: 73  73 67  Resp:  19 17 18   Temp: 97.6 F (36.4 C) 97.6 F (36.4 C) 98 F (36.7 C) 97.9 F (36.6 C)  TempSrc: Oral Oral Oral Oral  SpO2: 91% 98% 96% 94%  Weight:  78.9 kg    Height:        Intake/Output Summary (Last 24 hours) at 08/19/2021 1205 Last data filed at 08/19/2021 1133 Gross per 24 hour  Intake 476 ml  Output 650 ml  Net -174 ml   Filed Weights   08/17/21 0022 08/18/21 0041 08/19/21 0339  Weight: 79.5 kg 79.3 kg 78.9 kg    Examination:  General exam: Pleasant male, laying in bed, AAOx3, no distress CVS: S1-S2, regular rate rhythm Lungs: Clear bilaterally Abdomen: Soft, nontender, bowel sounds present Extremities: No edema, PICC line in right arm  Skin: No rashes Psychiatry:  Mood & affect appropriate.     Data Reviewed:   CBC: Recent Labs  Lab 08/14/21 0500 08/16/21 0510  WBC 9.6 9.9  HGB 12.8* 13.7  HCT 40.5 43.1  MCV 80.4 79.2*  PLT 287 539   Basic Metabolic Panel: Recent Labs  Lab 08/13/21 0353 08/14/21 0500 08/15/21 0518 08/16/21 0510 08/17/21 0538  08/18/21 0515 08/19/21 0530  NA 134*   < > 138 138 134* 136 135  K 5.0   < > 3.8 3.4* 3.1* 3.6 3.5  CL 99   < > 98 93* 86* 92* 92*  CO2 26   < > 30 34* 33* 32 29  GLUCOSE 94   < > 94 98 206* 159* 95  BUN 42*   < > 34* 33* 31* 35* 43*  CREATININE 2.35*   < > 2.44* 2.36* 2.42* 2.53* 3.15*  CALCIUM 8.7*   < > 8.6* 9.0 8.9 8.9 8.9  MG 2.1  --  1.9 2.3  --   --   --    < > = values in this interval not displayed.   GFR: Estimated Creatinine Clearance: 21.3 mL/min (A) (by C-G formula based on SCr of 3.15 mg/dL (H)). Liver Function Tests: No results for input(s): AST, ALT, ALKPHOS, BILITOT, PROT, ALBUMIN in the last 168 hours. No results for input(s): LIPASE, AMYLASE in the last 168 hours. No results for input(s): AMMONIA in the last 168 hours. Coagulation Profile: No results for input(s): INR, PROTIME in the last 168 hours. Cardiac Enzymes: No results for input(s): CKTOTAL, CKMB, CKMBINDEX, TROPONINI in the last 168 hours. BNP (last 3 results) No results for input(s): PROBNP in the last 8760 hours. HbA1C: No results for input(s): HGBA1C in the last 72 hours. CBG: No results for input(s): GLUCAP in the last 168 hours.  Lipid Profile: No results for input(s): CHOL, HDL, LDLCALC, TRIG, CHOLHDL, LDLDIRECT in the last 72 hours. Thyroid Function Tests: No results for input(s): TSH, T4TOTAL, FREET4, T3FREE, THYROIDAB in the last 72 hours. Anemia Panel: No results for input(s): VITAMINB12, FOLATE, FERRITIN, TIBC, IRON, RETICCTPCT in the last 72 hours. Urine analysis:    Component Value Date/Time   COLORURINE YELLOW 06/22/2020 1728   APPEARANCEUR HAZY (A) 06/22/2020 1728   LABSPEC 1.018 06/22/2020 1728   PHURINE 5.0 06/22/2020 1728   GLUCOSEU NEGATIVE 06/22/2020 1728   HGBUR NEGATIVE 06/22/2020 1728   BILIRUBINUR NEGATIVE 06/22/2020 1728   KETONESUR NEGATIVE 06/22/2020 1728   PROTEINUR 30 (A) 06/22/2020 1728   UROBILINOGEN 0.2 01/06/2011 2117   NITRITE NEGATIVE 06/22/2020 1728    LEUKOCYTESUR NEGATIVE 06/22/2020 1728   Sepsis Labs: @LABRCNTIP (procalcitonin:4,lacticidven:4)  ) Recent Results (from the past 240 hour(s))  Resp Panel by RT-PCR (Flu A&B, Covid) Nasopharyngeal Swab     Status: None   Collection Time: 08/10/21 12:17 AM   Specimen: Nasopharyngeal Swab; Nasopharyngeal(NP) swabs in vial transport medium  Result Value Ref Range Status   SARS Coronavirus 2 by RT PCR NEGATIVE NEGATIVE Final    Comment: (NOTE) SARS-CoV-2 target nucleic acids are NOT DETECTED.  The SARS-CoV-2 RNA is generally detectable in upper respiratory specimens during the acute phase of infection. The lowest concentration of SARS-CoV-2 viral copies this assay can detect is 138 copies/mL. A negative result does not preclude SARS-Cov-2 infection and should not be used as the sole basis for treatment or other patient management decisions. A negative result may occur with  improper specimen collection/handling, submission of specimen other than nasopharyngeal swab, presence of viral mutation(s) within the areas targeted by this assay, and inadequate number of viral copies(<138 copies/mL). A negative result must be combined with clinical observations, patient history, and epidemiological information. The expected result is Negative.  Fact  Sheet for Patients:  EntrepreneurPulse.com.au  Fact Sheet for Healthcare Providers:  IncredibleEmployment.be  This test is no t yet approved or cleared by the [place] FDA and  has been authorized for detection and/or diagnosis of SARS-CoV-2 by FDA under an Emergency Use Authorization (EUA). This EUA will remain  in effect (meaning this test can be used) for the duration of the COVID-19 declaration under Section 564(b)(1) of the Act, 21 U.S.C.section 360bbb-3(b)(1), unless the authorization is terminated  or revoked sooner.       Influenza A by PCR NEGATIVE NEGATIVE Final   Influenza B by PCR NEGATIVE  NEGATIVE Final    Comment: (NOTE) The Xpert Xpress SARS-CoV-2/FLU/RSV plus assay is intended as an aid in the diagnosis of influenza from Nasopharyngeal swab specimens and should not be used as a sole basis for treatment. Nasal washings and aspirates are unacceptable for Xpert Xpress SARS-CoV-2/FLU/RSV testing.  Fact Sheet for Patients: EntrepreneurPulse.com.au  Fact Sheet for Healthcare Providers: IncredibleEmployment.be  This test is not yet approved or cleared by the [place] FDA and has been authorized for detection and/or diagnosis of SARS-CoV-2 by FDA under an Emergency Use Authorization (EUA). This EUA will remain in effect (meaning this test can be used) for the duration of the COVID-19 declaration under Section 564(b)(1) of the Act, 21 U.S.C. section 360bbb-3(b)(1), unless the authorization is terminated or revoked.  Performed at [facility], [place] [address], [place], [place] [address]      Radiology Studies: No results found.   Scheduled Meds:  amiodarone  200 mg Oral BID   apixaban  5 mg Oral BID   atorvastatin  80 mg Oral Daily   Chlorhexidine Gluconate Cloth  6 each Topical Daily   clopidogrel  75 mg Oral Daily   escitalopram  10 mg Oral Daily   ezetimibe  10 mg Oral Daily   midodrine  15 mg Oral TID WC   potassium chloride  20 mEq Oral Daily   sodium chloride flush  10-40 mL Intracatheter Q12H   torsemide  40 mg Oral Daily   Continuous Infusions:     LOS: 9 days    Time spent: 13min   Domenic Polite, MD [facility]   08/19/2021, 12:05 PM

## 2021-08-19 NOTE — Progress Notes (Signed)
Mobility Specialist Progress Note    08/19/21 1055  Mobility  Activity Ambulated independently in hallway  Level of Assistance Modified independent, requires aide device or extra time  Assistive Device  (IV pole)  Distance Ambulated (ft) 480 ft  Activity Response Tolerated well  $Mobility charge 1 Mobility   Pt received in bed and agreeable. No complaints. Returned to sitting EOB with call bell in reach and MD present.   [facility] Mobility Specialist  M.S. 2C and 6E: [phone] M.S. 4E: [phone]

## 2021-08-19 NOTE — Progress Notes (Signed)
Advanced Heart Failure Rounding Note  PCP-Cardiologist: Sanda Klein, MD  AHFC: Dr. Haroldine Laws   Subjective:    Dobutamine stopped 2/3  CO-OX 62% -> 51% -> 55%  Off lasix. Weight stable. Feels ok. Denies SOB, orthopnea or PND  SBP stable 105-125 on midodrine CVP 6 (personally checked)  Scr 2.3 -> 2.4 -> 2.5 -> 3.1  Objective:   Weight Range: 78.9 kg Body mass index is 27.25 kg/m.   Vital Signs:   Temp:  [97.6 F (36.4 C)-98 F (36.7 C)] 98 F (36.7 C) (02/05 0731) Pulse Rate:  [49-76] 73 (02/05 0731) Resp:  [17-19] 17 (02/05 0731) BP: (103-125)/(75-98) 117/75 (02/05 0731) SpO2:  [91 %-100 %] 96 % (02/05 0731) Weight:  [78.9 kg] 78.9 kg (02/05 0339) Last BM Date: 08/18/21  Weight change: Filed Weights   08/17/21 0022 08/18/21 0041 08/19/21 0339  Weight: 79.5 kg 79.3 kg 78.9 kg    Intake/Output:   Intake/Output Summary (Last 24 hours) at 08/19/2021 0901 Last data filed at 08/19/2021 0341 Gross per 24 hour  Intake 236 ml  Output 200 ml  Net 36 ml       Physical Exam   General:  Sitting up in bed . No resp difficulty HEENT: normal Neck: supple. no JVD. Carotids 2+ bilat; no bruits. No lymphadenopathy or thryomegaly appreciated. Cor: Regular rate & rhythm. No rubs, gallops or murmurs. Lungs: clear Abdomen: soft, nontender, nondistended. No hepatosplenomegaly. No bruits or masses. Good bowel sounds. Extremities: no cyanosis, clubbing, rash, edema Neuro: alert & orientedx3, cranial nerves grossly intact. moves all 4 extremities w/o difficulty. Affect pleasant   Telemetry   NSR 70s Personally reviewed  Labs    CBC No results for input(s): WBC, NEUTROABS, HGB, HCT, MCV, PLT in the last 72 hours.  Basic Metabolic Panel Recent Labs    08/18/21 0515 08/19/21 0530  NA 136 135  K 3.6 3.5  CL 92* 92*  CO2 32 29  GLUCOSE 159* 95  BUN 35* 43*  CREATININE 2.53* 3.15*  CALCIUM 8.9 8.9    Liver Function Tests No results for input(s): AST, ALT,  ALKPHOS, BILITOT, PROT, ALBUMIN in the last 72 hours. No results for input(s): LIPASE, AMYLASE in the last 72 hours. Cardiac Enzymes No results for input(s): CKTOTAL, CKMB, CKMBINDEX, TROPONINI in the last 72 hours.  BNP: BNP (last 3 results) Recent Labs    08/10/21 0218  BNP 2,604.2*     ProBNP (last 3 results) No results for input(s): PROBNP in the last 8760 hours.   D-Dimer No results for input(s): DDIMER in the last 72 hours. Hemoglobin A1C No results for input(s): HGBA1C in the last 72 hours. Fasting Lipid Panel No results for input(s): CHOL, HDL, LDLCALC, TRIG, CHOLHDL, LDLDIRECT in the last 72 hours. Thyroid Function Tests No results for input(s): TSH, T4TOTAL, T3FREE, THYROIDAB in the last 72 hours.  Invalid input(s): FREET3  Other results:   Imaging    No results found.   Medications:     Scheduled Medications:  amiodarone  200 mg Oral BID   apixaban  5 mg Oral BID   atorvastatin  80 mg Oral Daily   Chlorhexidine Gluconate Cloth  6 each Topical Daily   clopidogrel  75 mg Oral Daily   escitalopram  10 mg Oral Daily   ezetimibe  10 mg Oral Daily   midodrine  15 mg Oral TID WC   sodium chloride flush  10-40 mL Intracatheter Q12H   torsemide  40 mg  Oral Daily    Infusions:    PRN Medications: acetaminophen **OR** acetaminophen, alum & mag hydroxide-simeth, loperamide, nitroGLYCERIN, nystatin, polyethylene glycol, sodium chloride flush, traZODone, trimethobenzamide    Patient Profile   66 y/o male with severe CAD s/p CABG 2014 with multiple subsequent MIs/PCIs., systolic HF due to iCM EF 20-25%, CKD 3B admitted with a/c systolic dysfunction with marked volume overload, worsening renal failure and low BP concerning for low output. Started on DBA.    Echo EF 20-25% Moderate RV dysfunction   Assessment/Plan   Acute on chronic systolic CHF with biventricular failure/ICM: - Echo 05/04/20 EF 25%. Moderate RV to severe RV dysfunction and possible  LV thrombus.  - [place] 10/21 with preserved output and well compensated filling pressures.  - Echo 11/21 EF 25-30%,  - Admit with a/c CHF - has not been totally adherent with medical therapy - Echo 08/10/21: EF 20-25% Moderate RV dysfunction  - Developed hypotension and worsening renal function with attempts to diurese with IV lasix, concerning for low output, started on DBA 2.5 - DBA stopped 2/3. Co-ox 62%-> 51% -> 55% - CVP ok off lasix - Continue midodrine to 15 mg tid to support BP - Off entresto and spiro with AKI and hypotension - Off beta blocker with a/c CHF - He has end-stage HF due to iCM. Co-ox marginal but stable after stopping DBA but Scr worse. He is not interested in advanced therapies or home inotropes.Will continue to follow. If renal function continues to deteriorate will need Hospice involvement.If stable, hopefully home tomorrow.    2. CAD: - s/p CABG 2014 - Inferior STEMI 09/20 s/p DES SVG to RCA anastomosis - Inferior STEMI 05/03/20 s/p POBA SVG->PDA - Inferoposterior STEMI 09/22 >> thrombotic occlusion SVG to OM1 - No s/s angina - On plavix. No ASA d/t anticoagulation.  - On atorvastatin 80 -HS trop low level and flat, 31>>32    3. Hx VT arrest/frequent PVCs: - S/p dual chamber ICD placement - No VT on tele nor device interrogation  - Frequent PVCs on tele.   - Now on po amio - Keep K > 4 and Mag > 2 - Supp K   4. Hx Bilateral PE/b/l DVT: - Anticoagulated with Eliquis   5. Possible LV thrombus: - Noted on echo 10/21, TEE 11/21 with no thrombus   6. AKI on CKD IIIb: - Baseline Scr 1.4, 1.87 on admit - Now up to 3.1 off DBA -> suspect cardiorenal/low output - Continue  midodrine 15 tid to help w/ renal perfusion and keep SBPs > 110   7. Hypokalemia - K 3.5 - Supp w/ KCl   8. DNR/DNI - plan as above   Length of Stay: 9  Glori Bickers, MD  08/19/2021, 9:01 AM  Advanced Heart Failure Team Pager [phone] (M-F; 7a - 5p)  Please contact [facility] for night-coverage after hours (5p -7a ) and weekends on [email or web address]

## 2021-08-20 ENCOUNTER — Encounter (HOSPITAL_COMMUNITY): Payer: Self-pay | Admitting: Interventional Radiology

## 2021-08-20 ENCOUNTER — Inpatient Hospital Stay (HOSPITAL_COMMUNITY): Payer: Medicare PPO

## 2021-08-20 DIAGNOSIS — I5023 Acute on chronic systolic (congestive) heart failure: Secondary | ICD-10-CM | POA: Diagnosis not present

## 2021-08-20 HISTORY — PX: IR FLUORO GUIDE CV LINE RIGHT: IMG2283

## 2021-08-20 HISTORY — PX: IR US GUIDE VASC ACCESS RIGHT: IMG2390

## 2021-08-20 LAB — CBC
HCT: 43.9 % (ref 39.0–52.0)
Hemoglobin: 13.5 g/dL (ref 13.0–17.0)
MCH: 24.4 pg — ABNORMAL LOW (ref 26.0–34.0)
MCHC: 30.8 g/dL (ref 30.0–36.0)
MCV: 79.2 fL — ABNORMAL LOW (ref 80.0–100.0)
Platelets: 366 10*3/uL (ref 150–400)
RBC: 5.54 MIL/uL (ref 4.22–5.81)
RDW: 18.2 % — ABNORMAL HIGH (ref 11.5–15.5)
WBC: 10.6 10*3/uL — ABNORMAL HIGH (ref 4.0–10.5)
nRBC: 0 % (ref 0.0–0.2)

## 2021-08-20 LAB — COOXEMETRY PANEL
Carboxyhemoglobin: 1.4 % (ref 0.5–1.5)
Carboxyhemoglobin: 1.4 % (ref 0.5–1.5)
Carboxyhemoglobin: 1.9 % — ABNORMAL HIGH (ref 0.5–1.5)
Methemoglobin: 0.7 % (ref 0.0–1.5)
Methemoglobin: 0.7 % (ref 0.0–1.5)
Methemoglobin: 0.6 % (ref 0.0–1.5)
O2 Saturation: 51.1 %
O2 Saturation: 41 %
O2 Saturation: 88 %
Total hemoglobin: 13.8 g/dL (ref 12.0–16.0)
Total hemoglobin: 13.9 g/dL (ref 12.0–16.0)
Total hemoglobin: 14.2 g/dL (ref 12.0–16.0)

## 2021-08-20 LAB — BASIC METABOLIC PANEL
Anion gap: 14 (ref 5–15)
BUN: 52 mg/dL — ABNORMAL HIGH (ref 8–23)
CO2: 30 mmol/L (ref 22–32)
Calcium: 9.2 mg/dL (ref 8.9–10.3)
Chloride: 89 mmol/L — ABNORMAL LOW (ref 98–111)
Creatinine, Ser: 3.47 mg/dL — ABNORMAL HIGH (ref 0.61–1.24)
GFR, Estimated: 19 mL/min — ABNORMAL LOW (ref >60)
Glucose, Bld: 93 mg/dL (ref 70–99)
Potassium: 3.7 mmol/L (ref 3.5–5.1)
Sodium: 133 mmol/L — ABNORMAL LOW (ref 135–145)

## 2021-08-20 MED ORDER — HEPARIN SOD (PORK) LOCK FLUSH 100 UNIT/ML IV SOLN
INTRAVENOUS | Status: AC
  Filled 2021-08-20: qty 5

## 2021-08-20 MED ORDER — DOBUTAMINE IN D5W 4-5 MG/ML-% IV SOLN
2.5000 ug/kg/min | INTRAVENOUS | Status: DC
  Administered 2021-08-20: 2.5 ug/kg/min via INTRAVENOUS
  Filled 2021-08-20: qty 250

## 2021-08-20 MED ORDER — LIDOCAINE HCL 1 % IJ SOLN
INTRAMUSCULAR | Status: AC
  Administered 2021-08-20: 10 mL
  Filled 2021-08-20: qty 20

## 2021-08-20 NOTE — Procedures (Signed)
Interventional Radiology Procedure Note  Procedure: Right IJ tunneled CVC. Tips in the upper RA and ready for use.   Complications: None  Estimated Blood Loss: None  Recommendations: - Routine line care  Signed,  Criselda Peaches, MD

## 2021-08-20 NOTE — Care Management Important Message (Signed)
Important Message  Patient Details  Name: Emre Stock MRN: [id number] Date of Birth: 1955-07-16   Medicare Important Message Given:  Yes     Shelda Altes 08/20/2021, 8:42 AM

## 2021-08-20 NOTE — Progress Notes (Signed)
°  Dobutamine restarted today.  Mr [name] would like to go home with Dobutamine.  Order placed of IR to placed single lumen tunneled PICC.    Once placed will remove PICC in RUE.   Versie Fleener NP-C   11:53 AM

## 2021-08-20 NOTE — Progress Notes (Addendum)
Advanced Heart Failure Rounding Note  PCP-Cardiologist: Sanda Klein, MD  AHFC: Dr. Haroldine Laws   Subjective:    Dobutamine stopped 2/3  CO-OX 62% -> 51% -> 55 ->51%  Scr continues to rise, 2.3 -> 2.4 -> 2.5 -> 3.1->3.5  SBPs ok w/ midodrine, 110-120s.   On Torsemide 40 mg daily. 950 cc in UOP yesterday. CVP 9-10. ReDs clip 38%   He feels fair. Denies dyspnea. No orthopnea/PND. Asking if he can go home. We discussed home DBA and he is interested in this.    Objective:   Weight Range: 78.4 kg Body mass index is 27.06 kg/m.   Vital Signs:   Temp:  [97.6 F (36.4 C)-98 F (36.7 C)] 98 F (36.7 C) (02/06 0405) Pulse Rate:  [66-73] 66 (02/05 1524) Resp:  [17-19] 19 (02/06 0405) BP: (98-120)/(71-75) 107/72 (02/06 0405) SpO2:  [93 %-100 %] 96 % (02/06 0405) Weight:  [78.4 kg] 78.4 kg (02/06 0406) Last BM Date: 08/19/21  Weight change: Filed Weights   08/18/21 0041 08/19/21 0339 08/20/21 0406  Weight: 79.3 kg 78.9 kg 78.4 kg    Intake/Output:   Intake/Output Summary (Last 24 hours) at 08/20/2021 0711 Last data filed at 08/20/2021 0404 Gross per 24 hour  Intake 550 ml  Output 950 ml  Net -400 ml      Physical Exam   CVP 9, ReDS clip 38%  General:  fatigued appearing. No respiratory difficulty HEENT: normal Neck: supple. JVD 10 cm. Carotids 2+ bilat; no bruits. No lymphadenopathy or thyromegaly appreciated. Cor: PMI nondisplaced. Regular rate & rhythm. No rubs, gallops or murmurs. Lungs: decreased BS at the bases bilaterally  Abdomen: soft, nontender, nondistended. No hepatosplenomegaly. No bruits or masses. Good bowel sounds. Extremities: no cyanosis, clubbing, rash, edema + Rt UE PICC  Neuro: alert & oriented x 3, cranial nerves grossly intact. moves all 4 extremities w/o difficulty. Affect pleasant.   Telemetry   NSR 70s Personally reviewed  Labs    CBC Recent Labs    08/20/21 0421  WBC 10.6*  HGB 13.5  HCT 43.9  MCV 79.2*  PLT 510   Basic  Metabolic Panel Recent Labs    08/19/21 0530 08/20/21 0421  NA 135 133*  K 3.5 3.7  CL 92* 89*  CO2 29 30  GLUCOSE 95 93  BUN 43* 52*  CREATININE 3.15* 3.47*  CALCIUM 8.9 9.2   Liver Function Tests No results for input(s): AST, ALT, ALKPHOS, BILITOT, PROT, ALBUMIN in the last 72 hours. No results for input(s): LIPASE, AMYLASE in the last 72 hours. Cardiac Enzymes No results for input(s): CKTOTAL, CKMB, CKMBINDEX, TROPONINI in the last 72 hours.  BNP: BNP (last 3 results) Recent Labs    08/10/21 0218  BNP 2,604.2*    ProBNP (last 3 results) No results for input(s): PROBNP in the last 8760 hours.   D-Dimer No results for input(s): DDIMER in the last 72 hours. Hemoglobin A1C No results for input(s): HGBA1C in the last 72 hours. Fasting Lipid Panel No results for input(s): CHOL, HDL, LDLCALC, TRIG, CHOLHDL, LDLDIRECT in the last 72 hours. Thyroid Function Tests No results for input(s): TSH, T4TOTAL, T3FREE, THYROIDAB in the last 72 hours.  Invalid input(s): FREET3  Other results:   Imaging    No results found.   Medications:     Scheduled Medications:  amiodarone  200 mg Oral BID   apixaban  5 mg Oral BID   atorvastatin  80 mg Oral Daily   Chlorhexidine  Gluconate Cloth  6 each Topical Daily   clopidogrel  75 mg Oral Daily   escitalopram  10 mg Oral Daily   ezetimibe  10 mg Oral Daily   midodrine  15 mg Oral TID WC   potassium chloride  20 mEq Oral Daily   sodium chloride flush  10-40 mL Intracatheter Q12H   torsemide  40 mg Oral Daily    Infusions:    PRN Medications: acetaminophen **OR** acetaminophen, alum & mag hydroxide-simeth, loperamide, nitroGLYCERIN, nystatin, polyethylene glycol, sodium chloride flush, traZODone, trimethobenzamide    Patient Profile   66 y/o male with severe CAD s/p CABG 2014 with multiple subsequent MIs/PCIs., systolic HF due to iCM EF 20-25%, CKD 3B admitted with a/c systolic dysfunction with marked volume  overload, worsening renal failure and low BP concerning for low output. Started on DBA.    Echo EF 20-25% Moderate RV dysfunction   Assessment/Plan   Acute on chronic systolic CHF with biventricular failure/ICM: - Echo 05/04/20 EF 25%. Moderate RV to severe RV dysfunction and possible LV thrombus.  - [place] 10/21 with preserved output and well compensated filling pressures.  - Echo 11/21 EF 25-30%,  - Admit with a/c CHF - has not been totally adherent with medical therapy - Echo 08/10/21: EF 20-25% Moderate RV dysfunction  - Developed hypotension and worsening renal function with attempts to diurese with IV lasix, concerning for low output, started on DBA 2.5 - DBA stopped 2/3. Co-ox 62%-> 51% -> 55 ->51% - CVP trending up, 9 today. ReDs clip 38%. Restart DBA 2.5 (planing home inotropes). Continue torsemide 40 mg daily  - Continue midodrine to 15 mg tid to support BP - Off entresto and spiro with AKI and hypotension - Off beta blocker with a/c CHF - He has end-stage HF due to iCM. Co-ox marginal but stable after stopping DBA but Scr worse. He is not interested in advanced therapies (VAD/transplant) but would like home DBA. Will work to arrange. Carolynn Sayers notified. Orders placed.    2. CAD: - s/p CABG 2014 - Inferior STEMI 09/20 s/p DES SVG to RCA anastomosis - Inferior STEMI 05/03/20 s/p POBA SVG->PDA - Inferoposterior STEMI 09/22 >> thrombotic occlusion SVG to OM1 - No s/s angina - On plavix. No ASA d/t anticoagulation.  - On atorvastatin 80 -HS trop low level and flat, 31>>32    3. Hx VT arrest/frequent PVCs: - S/p dual chamber ICD placement - No VT on tele nor device interrogation  - Frequent PVCs on tele.   - Now on po amio - Keep K > 4 and Mag > 2 - Supp K   4. Hx Bilateral PE/b/l DVT: - Anticoagulated with Eliquis   5. Possible LV thrombus: - Noted on echo 10/21, TEE 11/21 with no thrombus   6. AKI on CKD IIIb: - Baseline Scr 1.4, 1.87 on admit - Now up to 3.5 off  DBA -> suspect cardiorenal/low output - Continue  midodrine 15 tid to help w/ renal perfusion and keep SBPs > 110  - d/w pt. He would like to go home w/ inotropic support, restart DBA 2.5   7. Hypokalemia - K 3.4 - Supp w/ KCl   8. DNR/DNI - plan as above   Length of Stay: [address], PA-C  08/20/2021, 7:11 AM  Advanced Heart Failure Team Pager [phone] (M-F; 7a - 5p)  Please contact [facility] for night-coverage after hours (5p -7a ) and weekends on [email or web address]   Patient seen and examined  with the above-signed Advanced Practice Provider and/or Housestaff. I personally reviewed laboratory data, imaging studies and relevant notes. I independently examined the patient and formulated the important aspects of the plan. I have edited the note to reflect any of my changes or salient points. I have personally discussed the plan with the patient and/or family.  Feels ok. Denies CP, SOB, orthopnea or PND.   Remains on torsemide. Scr continues to worsen off DBA. Co-ox 51%  General:  Weak appearing. No resp difficulty HEENT: normal Neck: supple. JVP 8-9 Carotids 2+ bilat; no bruits. No lymphadenopathy or thryomegaly appreciated. Cor: Regular rate & rhythm. No rubs, gallops or murmurs. Lungs: clear Abdomen: soft, nontender, nondistended. No hepatosplenomegaly. No bruits or masses. Good bowel sounds. Extremities: no cyanosis, clubbing, rash, edema Neuro: alert & orientedx3, cranial nerves grossly intact. moves all 4 extremities w/o difficulty. Affect pleasant  He is back in low output HF now that he is off dobutamine. We discussed options and he is amenable to going home with palliative dobutamine. Will place tunneled PICC and arrange. Follow SCr closely.  Glori Bickers, MD  4:03 PM

## 2021-08-20 NOTE — Progress Notes (Signed)
PROGRESS NOTE    [name]  ELF:[id number] DOB: Jan 30, 1956 DOA: 08/09/2021 PCP: [name], MD  Brief Narrative:[name] with history of CVA, right hemiparesis, CKD 3 AAA, chronic systolic CHF-EF 58-52%, h/o PE, thoracic aortic aneurysm, history of V. tach status post AICD presented to the ED with worsening dyspnea and edema.  Chest x-ray w/cardiomegaly,pulmonary vascular congestion.  -Admitted with biventricular heart failure, low output heart state, with worsening kidney function -Started on dobutamine, IV Lasix-improved with diuresis -Improved, dobutamine and Lasix discontinued -Hospital course complicated by worsening kidney function -Dobutamine restarted 2/6  Subjective: -Feels okay overall, bit tired, denies any dyspnea, wants to go home  Assessment & Plan:  * Acute on chronic systolic CHF (congestive heart failure) ([place])- (present on admission) Biventricular heart failure, ischemic cardiomyopathy -EF 25% with moderate to severe RV dysfunction -Hospital course complicated by hypotension and worsening renal function, low output heart failure -Treated with dobutamine gtt. and IV Lasix, diuresed well, -14 L -Euvolemic now, -Off Lasix and dobutamine, remains on high-dose midodrine, torsemide started -Off Entresto, Farxiga and Aldactone with AKI, hypotension -Dobutamine restarted 2/6 with worsening AKI, plan for discharge home on dobutamine gtt., single-lumen tunneled PICC ordered  History of pulmonary embolus (PE) - on Eliquis- (present on admission) Bilateral DVT  -continue Eliquis  CAD, multiple vessel -> severe native vessel CAD-CABG 2014- (present on admission) On clopidogrel and statin therapy  -Beta-blocker on hold -No evidence of ACS  Acute renal failure superimposed on stage 3a chronic kidney disease ([place])- (present on admission) Cardiorenal syndrome  hyponatremia and hypokalemia -Creatinine slowly worsened, baseline creatinine around 1.4, -low output heart  failure -Creatinine continues to worsen, up to 3.4 today, dobutamine being restarted, see discussion above -Monitor urine output, BMP in a.m.  History of ventricular tachycardia SP AICD -Currently on amnio gtt. -Monitor electrolytes  Status post CVA - Continue apixaban and statin  Hyperlipidemia with target LDL less than 70- (present on admission) Continue with atorvastatin and ezetimibe   Essential hypertension- (present on admission) See discussion above  DNR (do not resuscitate) discussion-resolved as of 08/14/2021 CODE STATUS changed back to full code, discussion with Dr. Cathlean Sauer  DVT prophylaxis: Eliquis Code Status: Full code Family Communication: No family at bedside Disposition Plan: Home in 1 to 2 days if creatinine is stable  Consultants:  Cardiology  Procedures:   Antimicrobials:    Objective: Vitals:   08/20/21 0405 08/20/21 0406 08/20/21 0733 08/20/21 1146  BP: 107/72  105/60 116/85  Pulse:   74 74  Resp: 19  18 17   Temp: 98 F (36.7 C)  98.2 F (36.8 C) 98 F (36.7 C)  TempSrc: Oral  Oral Oral  SpO2: 96%  96% 91%  Weight:  78.4 kg    Height:        Intake/Output Summary (Last 24 hours) at 08/20/2021 1513 Last data filed at 08/20/2021 1300 Gross per 24 hour  Intake 610 ml  Output 350 ml  Net 260 ml   Filed Weights   08/18/21 0041 08/19/21 0339 08/20/21 0406  Weight: 79.3 kg 78.9 kg 78.4 kg    Examination:  General exam: Pleasant male, laying in bed, AAOx3, no distress CVS: S1-S2, regular rate rhythm Lungs: Clear bilaterally Abdomen: Soft, nontender, bowel sounds present Extremities: No edema, PICC line in right arm  Skin: No rashes Psychiatry:  Mood & affect appropriate.     Data Reviewed:   CBC: Recent Labs  Lab 08/14/21 0500 08/16/21 0510 08/20/21 0421  WBC 9.6 9.9 10.6*  HGB 12.8* 13.7 13.5  HCT 40.5 43.1 43.9  MCV 80.4 79.2* 79.2*  PLT 287 354 846   Basic Metabolic Panel: Recent Labs  Lab 08/15/21 0518  08/16/21 0510 08/17/21 0538 08/18/21 0515 08/19/21 0530 08/20/21 0421  NA 138 138 134* 136 135 133*  K 3.8 3.4* 3.1* 3.6 3.5 3.7  CL 98 93* 86* 92* 92* 89*  CO2 30 34* 33* 32 29 30  GLUCOSE 94 98 206* 159* 95 93  BUN 34* 33* 31* 35* 43* 52*  CREATININE 2.44* 2.36* 2.42* 2.53* 3.15* 3.47*  CALCIUM 8.6* 9.0 8.9 8.9 8.9 9.2  MG 1.9 2.3  --   --   --   --    GFR: Estimated Creatinine Clearance: 19.3 mL/min (A) (by C-G formula based on SCr of 3.47 mg/dL (H)). Liver Function Tests: No results for input(s): AST, ALT, ALKPHOS, BILITOT, PROT, ALBUMIN in the last 168 hours. No results for input(s): LIPASE, AMYLASE in the last 168 hours. No results for input(s): AMMONIA in the last 168 hours. Coagulation Profile: No results for input(s): INR, PROTIME in the last 168 hours. Cardiac Enzymes: No results for input(s): CKTOTAL, CKMB, CKMBINDEX, TROPONINI in the last 168 hours. BNP (last 3 results) No results for input(s): PROBNP in the last 8760 hours. HbA1C: No results for input(s): HGBA1C in the last 72 hours. CBG: No results for input(s): GLUCAP in the last 168 hours.  Lipid Profile: No results for input(s): CHOL, HDL, LDLCALC, TRIG, CHOLHDL, LDLDIRECT in the last 72 hours. Thyroid Function Tests: No results for input(s): TSH, T4TOTAL, FREET4, T3FREE, THYROIDAB in the last 72 hours. Anemia Panel: No results for input(s): VITAMINB12, FOLATE, FERRITIN, TIBC, IRON, RETICCTPCT in the last 72 hours. Urine analysis:    Component Value Date/Time   COLORURINE YELLOW 06/22/2020 1728   APPEARANCEUR HAZY (A) 06/22/2020 1728   LABSPEC 1.018 06/22/2020 1728   PHURINE 5.0 06/22/2020 1728   GLUCOSEU NEGATIVE 06/22/2020 1728   HGBUR NEGATIVE 06/22/2020 1728   BILIRUBINUR NEGATIVE 06/22/2020 1728   KETONESUR NEGATIVE 06/22/2020 1728   PROTEINUR 30 (A) 06/22/2020 1728   UROBILINOGEN 0.2 01/06/2011 2117   NITRITE NEGATIVE 06/22/2020 1728   LEUKOCYTESUR NEGATIVE 06/22/2020 1728   Sepsis  Labs: @LABRCNTIP (procalcitonin:4,lacticidven:4)  ) No results found for this or any previous visit (from the past 240 hour(s)).    Radiology Studies: No results found.   Scheduled Meds:  amiodarone  200 mg Oral BID   apixaban  5 mg Oral BID   atorvastatin  80 mg Oral Daily   Chlorhexidine Gluconate Cloth  6 each Topical Daily   clopidogrel  75 mg Oral Daily   escitalopram  10 mg Oral Daily   ezetimibe  10 mg Oral Daily   midodrine  15 mg Oral TID WC   potassium chloride  20 mEq Oral Daily   sodium chloride flush  10-40 mL Intracatheter Q12H   torsemide  40 mg Oral Daily   Continuous Infusions:  DOBUTamine 2.5 mcg/kg/min (08/20/21 1054)      LOS: 10 days    Time spent: 22min   Domenic Polite, MD [facility]   08/20/2021, 3:13 PM

## 2021-08-20 NOTE — TOC CM/SW Note (Addendum)
313 pm Received call from [facility]  # is [phone] ext: [phone]. Returned call. Dobutamine was approved with [employer]. Contacted [employer] rep, Pam RN to make aware. Jonnie Finner RN3 CCM, Heart Failure TOC CM [phone]   1251 pm  HF TOC CM contacted [employer] IV Infusion Coordinator, Pam RN with new referral for IV Dobutamine. [place], Heart Failure TOC CM [phone]

## 2021-08-21 ENCOUNTER — Encounter (HOSPITAL_COMMUNITY): Payer: Medicare PPO

## 2021-08-21 ENCOUNTER — Other Ambulatory Visit (HOSPITAL_COMMUNITY): Payer: Self-pay | Admitting: Internal Medicine

## 2021-08-21 DIAGNOSIS — I5023 Acute on chronic systolic (congestive) heart failure: Secondary | ICD-10-CM | POA: Diagnosis not present

## 2021-08-21 LAB — BASIC METABOLIC PANEL
Anion gap: 12 (ref 5–15)
BUN: 59 mg/dL — ABNORMAL HIGH (ref 8–23)
CO2: 33 mmol/L — ABNORMAL HIGH (ref 22–32)
Calcium: 9.2 mg/dL (ref 8.9–10.3)
Chloride: 90 mmol/L — ABNORMAL LOW (ref 98–111)
Creatinine, Ser: 3.12 mg/dL — ABNORMAL HIGH (ref 0.61–1.24)
GFR, Estimated: 21 mL/min — ABNORMAL LOW (ref >60)
Glucose, Bld: 85 mg/dL (ref 70–99)
Potassium: 3.4 mmol/L — ABNORMAL LOW (ref 3.5–5.1)
Sodium: 135 mmol/L (ref 135–145)

## 2021-08-21 LAB — COOXEMETRY PANEL
Carboxyhemoglobin: 1.5 % (ref 0.5–1.5)
Methemoglobin: 0.8 % (ref 0.0–1.5)
O2 Saturation: 63.7 %
Total hemoglobin: 13.7 g/dL (ref 12.0–16.0)

## 2021-08-21 LAB — MAGNESIUM: Magnesium: 2.4 mg/dL (ref 1.7–2.4)

## 2021-08-21 MED ORDER — POTASSIUM CHLORIDE ER 10 MEQ PO TBCR
40.0000 meq | EXTENDED_RELEASE_TABLET | Freq: Two times a day (BID) | ORAL | Status: DC
  Administered 2021-08-21: 40 meq via ORAL
  Filled 2021-08-21 (×3): qty 4

## 2021-08-21 MED ORDER — MIDODRINE HCL 5 MG PO TABS: 15.0000 mg | ORAL_TABLET | Freq: Three times a day (TID) | ORAL | 6 refills | Status: DC

## 2021-08-21 MED ORDER — TORSEMIDE 20 MG PO TABS: 40.0000 mg | ORAL_TABLET | Freq: Every day | ORAL | 6 refills | Status: DC

## 2021-08-21 MED ORDER — DOBUTAMINE IN D5W 4-5 MG/ML-% IV SOLN: 2.5000 ug/kg/min | INTRAVENOUS | 12 refills | Status: DC

## 2021-08-21 MED ORDER — POTASSIUM CHLORIDE ER 20 MEQ PO TBCR: 20.0000 meq | EXTENDED_RELEASE_TABLET | Freq: Two times a day (BID) | ORAL | 6 refills | Status: DC

## 2021-08-21 NOTE — Progress Notes (Addendum)
Advanced Heart Failure Rounding Note  PCP-Cardiologist: Sanda Klein, MD  AH[facility]: Dr. Haroldine Laws   Subjective:    Dobutamine stopped 2/3 . CO-OX dropped and renal function worsened.   Yesterday dobutamine was restarted. CO-OX and renal function improved. Had tunneled PICC placed for home dobutamine.   Denies SOB. Feels a little better today. Complaining of tenderness around tunneled PICC.     Objective:   Weight Range: 77.4 kg Body mass index is 26.74 kg/m.   Vital Signs:   Temp:  [97.8 F (36.6 C)-98.4 F (36.9 C)] 97.9 F (36.6 C) (02/07 0755) Pulse Rate:  [69-77] 77 (02/07 0755) Resp:  [17-18] 18 (02/07 0755) BP: (115-120)/(85-93) 117/93 (02/07 0755) SpO2:  [91 %-100 %] 97 % (02/07 0755) Weight:  [77.4 kg] 77.4 kg (02/07 0422) Last BM Date: 08/20/21  Weight change: Filed Weights   08/19/21 0339 08/20/21 0406 08/21/21 0422  Weight: 78.9 kg 78.4 kg 77.4 kg    Intake/Output:   Intake/Output Summary (Last 24 hours) at 08/21/2021 0807 Last data filed at 08/21/2021 0429 Gross per 24 hour  Intake 540 ml  Output 1400 ml  Net -860 ml      Physical Exam  CVP 2 General:  No resp difficulty. R upper chest tender/ecchymotic.  HEENT: normal Neck: supple. no JVD. Carotids 2+ bilat; no bruits. No lymphadenopathy or thryomegaly appreciated. Cor: PMI nondisplaced. Regular rate & rhythm. No rubs, gallops or murmurs. Lungs: clear Abdomen: soft, nontender, nondistended. No hepatosplenomegaly. No bruits or masses. Good bowel sounds. Extremities: no cyanosis, clubbing, rash, edema Neuro: alert & orientedx3, cranial nerves grossly intact. moves all 4 extremities w/o difficulty. Affect pleasant   Telemetry   SR 70s.   Labs    CBC Recent Labs    08/20/21 0421  WBC 10.6*  HGB 13.5  HCT 43.9  MCV 79.2*  PLT 706   Basic Metabolic Panel Recent Labs    08/20/21 0421 08/21/21 0439  NA 133* 135  K 3.7 3.4*  CL 89* 90*  CO2 30 33*  GLUCOSE 93 85  BUN 52* 59*   CREATININE 3.47* 3.12*  CALCIUM 9.2 9.2  MG  --  2.4   Liver Function Tests No results for input(s): AST, ALT, ALKPHOS, BILITOT, PROT, ALBUMIN in the last 72 hours. No results for input(s): LIPASE, AMYLASE in the last 72 hours. Cardiac Enzymes No results for input(s): CKTOTAL, CKMB, CKMBINDEX, TROPONINI in the last 72 hours.  BNP: BNP (last 3 results) Recent Labs    08/10/21 0218  BNP 2,604.2*    ProBNP (last 3 results) No results for input(s): PROBNP in the last 8760 hours.   D-Dimer No results for input(s): DDIMER in the last 72 hours. Hemoglobin A1C No results for input(s): HGBA1C in the last 72 hours. Fasting Lipid Panel No results for input(s): CHOL, HDL, LDLCALC, TRIG, CHOLHDL, LDLDIRECT in the last 72 hours. Thyroid Function Tests No results for input(s): TSH, T4TOTAL, T3FREE, THYROIDAB in the last 72 hours.  Invalid input(s): FREET3  Other results:   Imaging    IR Fluoro Guide CV Line Right  Result Date: 08/20/2021 Criselda Peaches, MD     08/20/2021  5:58 PM Interventional Radiology Procedure Note Procedure: Right IJ tunneled CVC. Tips in the upper RA and ready for use. Complications: None Estimated Blood Loss: None Recommendations: - Routine line care Signed, Criselda Peaches, MD     Medications:     Scheduled Medications:  amiodarone  200 mg Oral BID  apixaban  5 mg Oral BID   atorvastatin  80 mg Oral Daily   Chlorhexidine Gluconate Cloth  6 each Topical Daily   clopidogrel  75 mg Oral Daily   escitalopram  10 mg Oral Daily   ezetimibe  10 mg Oral Daily   midodrine  15 mg Oral TID WC   potassium chloride  20 mEq Oral Daily   sodium chloride flush  10-40 mL Intracatheter Q12H   torsemide  40 mg Oral Daily    Infusions:  DOBUTamine 2.5 mcg/kg/min (08/20/21 1054)     PRN Medications: acetaminophen **OR** acetaminophen, alum & mag hydroxide-simeth, loperamide, nitroGLYCERIN, nystatin, polyethylene glycol, sodium chloride flush,  traZODone, trimethobenzamide    Patient Profile   66 y/o male with severe CAD s/p CABG 2014 with multiple subsequent MIs/PCIs., systolic HF due to iCM EF 20-25%, CKD 3B admitted with a/c systolic dysfunction with marked volume overload, worsening renal failure and low BP concerning for low output. Started on DBA.    Echo EF 20-25% Moderate RV dysfunction   Assessment/Plan   Acute on chronic systolic CHF with biventricular failure/ICM: - Echo 05/04/20 EF 25%. Moderate RV to severe RV dysfunction and possible LV thrombus.  - Grant 10/21 with preserved output and well compensated filling pressures.  - Echo 11/21 EF 25-30%,  - Admit with a/c CHF - has not been totally adherent with medical therapy - Echo 08/10/21: EF 20-25% Moderate RV dysfunction  - Developed hypotension and worsening renal function with attempts to diurese with IV lasix, concerning for low output, started on DBA 2.5 - DBA stopped 2/3. CO-OX and renal function went down.  - Yesterday DBA restarted.  CO-OX and renal function improved.  - Volume status stable. Continue torsemide 40 mg daily  - Continue midodrine to 15 mg tid to support BP - Off entresto and spiro with AKI and hypotension - Off beta blocker with a/c CHF - He has end-stage HF due to iCM.  He is not interested in advanced therapies (VAD/transplant) but would like home DBA. Will work to arrange. Carolynn Sayers notified. Orders placed.    2. CAD: - s/p CABG 2014 - Inferior STEMI 09/20 s/p DES SVG to RCA anastomosis - Inferior STEMI 05/03/20 s/p POBA SVG->PDA - Inferoposterior STEMI 09/22 >> thrombotic occlusion SVG to OM1 - No s/s angina - On plavix. No ASA d/t anticoagulation.  - On atorvastatin 80 -HS trop low level and flat, 31>>32    3. Hx VT arrest/frequent PVCs: - S/p dual chamber ICD placement - No VT on tele nor device interrogation  - Frequent PVCs on tele.   - Now on po amio - Keep K > 4 and Mag > 2 - Supp K   4. Hx Bilateral PE/b/l DVT: -  Anticoagulated with Eliquis   5. Possible LV thrombus: - Noted on echo 10/21, TEE 11/21 with no thrombus   6. AKI on CKD IIIb: - Baseline Scr 1.4, 1.87 on admit - Off  DBA creatinine was up to 3.5 -> suspect cardiorenal/low output. DBA restarted.  - Now down to 3.1 after restarting DBA.  - Continue  midodrine 15 tid to help w/ renal perfusion and keep SBPs > 110   7. Hypokalemia - K 3.4 - Supp w/ KCl   8. DNR/DNI - plan as above   Now with tunneled PICC. Setting up home dobutamine. Authorization has been set up.   Ask IR to check tunneled PICC. Could d/c today.     Length of Stay:  [place], NP  08/21/2021, 8:07 AM  Advanced Heart Failure Team Pager [phone] (M-F; 7a - 5p)  Please contact [facility] for night-coverage after hours (5p -7a ) and weekends on [email or web address]  Patient seen and examined with the above-signed Advanced Practice Provider and/or Housestaff. I personally reviewed laboratory data, imaging studies and relevant notes. I independently examined the patient and formulated the important aspects of the plan. I have edited the note to reflect any of my changes or salient points. I have personally discussed the plan with the patient and/or family.  Tunneled PICC placed yesterday. Back on DBA.   Feeling better. SCr and co-ox improved. CVP 2-3  General:  Sitting up in bed No resp difficulty HEENT: normal Neck: supple. no JVD. Carotids 2+ bilat; no bruits. No lymphadenopathy or thryomegaly appreciated. R picc with mild hematoma Cor: PMI nondisplaced. Regular rate & rhythm. No rubs, gallops or murmurs. Lungs: clear Abdomen: soft, nontender, nondistended. No hepatosplenomegaly. No bruits or masses. Good bowel sounds. Extremities: no cyanosis, clubbing, rash, edema Neuro: alert & orientedx3, cranial nerves grossly intact. moves all 4 extremities w/o difficulty. Affect pleasant  Plan for d/c home with palliative dobutamine when IV nursing services in place.    Glori Bickers, MD  8:29 AM

## 2021-08-21 NOTE — Discharge Summary (Addendum)
Physician Discharge Summary  [name] RCV:[id number] DOB: 01/02/1956 DOA: 08/09/2021  PCP: [name] Sake, MD  Admit date: 08/09/2021 Discharge date: 08/21/2021  Time spent: 35  minutes  Recommendations for Outpatient Follow-up:  Heart failure clinic 2/13, please check BMP at follow-up PCP Dr. Lisbeth [name] 2/8   Discharge Diagnoses:  Principal Problem:   Acute on chronic systolic CHF (congestive heart failure) (HCC) Biventricular, low output heart failure   CAD, multiple vessel -> severe native vessel CAD-CABG 2014   History of pulmonary embolus (PE) - on Eliquis   Acute renal failure superimposed on stage 3a chronic kidney disease (HCC)   History of ventricular tachycardia   Essential hypertension   Hyperlipidemia with target LDL less than 70   Status post CVA   Discharge Condition: Stable  Diet recommendation: Low-sodium, heart healthy  Filed Weights   08/19/21 0339 08/20/21 0406 08/21/21 0422  Weight: 78.9 kg 78.4 kg 77.4 kg    History of present illness:  [name] with history of CVA, right hemiparesis, CKD 3 AAA, chronic systolic CHF-EF 10-25%, h/o PE, thoracic aortic aneurysm, history of V. tach status post AICD presented to the ED with worsening dyspnea and edema.  Chest x-ray w/cardiomegaly,pulmonary vascular congestion  Hospital Course:  [name] with history of CVA, right hemiparesis, CKD 3 AAA, chronic systolic CHF-EF 85-27%, h/o PE, thoracic aortic aneurysm, history of V. tach status post AICD presented to the ED with worsening dyspnea and edema.  Chest x-ray w/cardiomegaly,pulmonary vascular congestion.  -Admitted with biventricular heart failure, low output heart state, with worsening kidney function -Started on dobutamine, IV Lasix-improved with diuresis -Subsequently dobutamine and Lasix were discontinued, following this developed worsening renal failure, eventually dobutamine was restarted on 2/6, he will be discharged home on IV dobutamine infusion via tunneled IJ  central line Please see detailed hospital course below  Acute on chronic systolic CHF (congestive heart failure) ([place])- (present on admission) Biventricular heart failure, ischemic cardiomyopathy -EF 25% with moderate to severe RV dysfunction -Admitted with volume overload, hospital course complicated by hypotension and worsening renal function, low output heart failure -Treated with dobutamine gtt. and IV Lasix, diuresed well, -14 L -Euvolemic now, Lasix and dobutamine were discontinued, he was started on midodrine and remained on high-dose midodrine throughout this hospital course -Unfortunately his kidney function gradually worsened off dobutamine and this was restarted yesterday, clinically stable and improving, per heart failure team he will be discharged home on IV dobutamine infusion, oral torsemide 40 Mg daily -Single-lumen tunneled central line placed yesterday in radiology -Close follow-up in the heart failure clinic in 1 week   History of pulmonary embolus (PE) - on Eliquis- (present on admission) Bilateral DVT  -continue Eliquis   CAD, multiple vessel -> severe native vessel CAD-CABG 2014- (present on admission) On clopidogrel and statin therapy  -Beta-blocker on hold -No evidence of ACS   Acute renal failure superimposed on stage 3a chronic kidney disease ([place])- (present on admission) Cardiorenal syndrome  hyponatremia and hypokalemia -Creatinine slowly worsened, baseline creatinine around 1.4, -low output heart failure -Creatinine peaked at 3.4 yesterday, dobutamine restarted, now improving down to 3.1, will discharge home on dobutamine infusion, see above    History of ventricular tachycardia SP AICD -Continue amiodarone   Status post CVA - Continue apixaban and statin   Hyperlipidemia with target LDL less than 70- (present on admission) Continue with atorvastatin and ezetimibe    Essential hypertension- (present on admission) See discussion above     Consultants:  Cardiology-advanced heart failure team  Discharge Exam: Vitals:   08/21/21 0755 08/21/21 1112  BP: (!) 117/93 108/80  Pulse: 77 75  Resp: 18 18  Temp: 97.9 F (36.6 C) 98.1 F (36.7 C)  SpO2: 97% 96%   General exam: Pleasant male, laying in bed, AAOx3, no distress CVS: S1-S2, regular rate rhythm Lungs: Clear bilaterally Abdomen: Soft, nontender, bowel sounds present Extremities: No edema, PICC line in right arm  Skin: No rashes Psychiatry:  Mood & affect appropriate.    Discharge Instructions   Discharge Instructions     (HEART FAILURE PATIENTS) Call MD:  Anytime you have any of the following symptoms: 1) 3 pound weight gain in 24 hours or 5 pounds in 1 week 2) shortness of breath, with or without a dry hacking cough 3) swelling in the hands, feet or stomach 4) if you have to sleep on extra pillows at night in order to breathe.   Complete by: As directed    Diet - low sodium heart healthy   Complete by: As directed    Diet - low sodium heart healthy   Complete by: As directed    Discharge wound care:   Complete by: As directed    routine   Increase activity slowly   Complete by: As directed    Increase activity slowly   Complete by: As directed       Allergies as of 08/21/2021   No Known Allergies      Medication List     STOP taking these medications    Entresto 24-26 MG Generic drug: sacubitril-valsartan   Farxiga 10 MG Tabs tablet Generic drug: dapagliflozin propanediol   metoprolol succinate 25 MG 24 hr tablet Commonly known as: TOPROL-XL   spironolactone 25 MG tablet Commonly known as: ALDACTONE       TAKE these medications    amiodarone 200 MG tablet Commonly known as: PACERONE TAKE 1 TABLET BY MOUTH 2 TIMES DAILY.   apixaban 5 MG Tabs tablet Commonly known as: Eliquis Take 1 tablet (5 mg total) by mouth 2 (two) times daily.   atorvastatin 80 MG tablet Commonly known as: LIPITOR TAKE 1 TABLET BY MOUTH DAILY AT 6:00  PM   clopidogrel 75 MG tablet Commonly known as: PLAVIX Take 1 tablet (75 mg total) by mouth daily.   DOBUTamine 4-5 MG/ML-% infusion Commonly known as: DOBUTREX Inject 193.5 mcg/min into the vein continuous. Per [facility] infusion   escitalopram 10 MG tablet Commonly known as: Lexapro Take 1 tablet (10 mg total) by mouth daily.   ezetimibe 10 MG tablet Commonly known as: ZETIA Take 10 mg by mouth daily.   ezetimibe 10 MG tablet Commonly known as: ZETIA Take 1 tablet (10 mg total) by mouth daily.   midodrine 5 MG tablet Commonly known as: PROAMATINE Take 3 tablets (15 mg total) by mouth 3 (three) times daily with meals.   nitroGLYCERIN 0.4 MG SL tablet Commonly known as: NITROSTAT Place 1 tablet (0.4 mg total) under the tongue every 5 (five) minutes x 3 doses as needed for chest pain.   nystatin powder Commonly known as: MYCOSTATIN/NYSTOP APPLY TOPICALLY TO THE AFFECTED AREA 2 TIMES A DAY FOR 2 WEEKS What changed:  how much to take when to take this reasons to take this additional instructions   One-A-Day Mens 50+ Advantage Tabs Take 1 tablet by mouth daily with breakfast.   Potassium Chloride ER 20 MEQ Tbcr Take 20 mEq by mouth 2 (two) times daily.   torsemide 20 MG tablet Commonly  known as: DEMADEX Take 2 tablets (40 mg total) by mouth daily.               Durable Medical Equipment  (From admission, onward)           Start     Ordered   08/21/21 0822  Heart failure home health orders  (Heart failure home health orders / Face to face)  Once       Comments: Heart Failure Follow-up Care:  Verify follow-up appointments per Patient Discharge Instructions. Confirm transportation arranged. Reconcile home medications with discharge medication list. Remove discontinued medications from use. Assist patient/caregiver to manage medications using pill box. Reinforce low sodium food selection Assessments: Vital signs and oxygen saturation at each visit. Assess  home environment for safety concerns, caregiver support and availability of low-sodium foods. Consult Education officer, museum, PT/OT, Dietitian, and CNA based on assessments. Perform comprehensive cardiopulmonary assessment. Notify MD for any change in condition or weight gain of 3 pounds in one day or 5 pounds in one week with symptoms. Daily Weights and Symptom Monitoring: Ensure patient has access to scales. Teach patient/caregiver to weigh daily before breakfast and after voiding using same scale and record.    Teach patient/caregiver to track weight and symptoms and when to notify Provider. Activity: Develop individualized activity plan with patient/caregiver.   Home Paraenteral Inotropic Therapy : Data Collection Form  Patients name: [name]   Date: 08/21/21  Information below may not be completed by the supplier nor anyone in a Financial relationship with the supplier.  NYHA IV   1. Results of invasive hemodynamic monitoring  Cardiac Index Before Inotrope infusion:           1.6              On Inotrope infusion:            2.0             Drug and dose:   Dobutamine 2.5 mcg/kg/min   2. Cardiac medications immediately prior to inotrope infusion (List name, dose, and frequency) Torsemide 40 mg dialy   3. Dose this represent maximum tolerated doses of these medications? Yes.   4. Breathing status Prior to inotrope infusion: Dyspnea at rest  At time of discharge: Dyspnea on moderate  exertion.   5. Initial home prescription Drug and Dose:   Dobutamine 2.5 mcg/kg/min  for continuous infusion 24/hr day and 7 days/week  6. If continuous infusion is prescribed, have attempts to discontinue inotrope infusion in the hospital failed?   Yes.  7. If intermittent infusion is prescribed, have there been repeated hospitalizations for heart failure which Parenteral inotrope were required? Not applicable.   8. Is patient capable of going to the physician for outpatient evaluation? Yes.     9. Is routine electrocardiographic monitoring required in the Home?  No.   The above statements and any additional explanations included separately are true and accurate and there is documentation present in the patients medical record to support these statements.   Completed by Darrick Grinder, NP   In instances where this form was completed by an Advanced Practice Provider, please see EMR for physician Co-Signature.   [facility] to provide  Labs every other week to include BMET, Mg, and CBC with Diff. Additional as needed. Should be drawn via PERIPHERAL stick. NOT PICC line.   J1250 Dobutamine 2.5  mcg/kg/min X 52 weeks A4221 Supplies for maintenance of drug infusion catheter A4222 Supplies for the external drug  infusion per cassette or bag [id number] Ambulatory Infusion pump  Question Answer Comment  Heart Failure Follow-up Care Advanced Heart Failure (AHF) Clinic at [phone]   Obtain the following labs Basic Metabolic Panel   Lab frequency Weekly   Fax lab results to AHF Clinic at [phone]   Diet Low Sodium Heart Healthy   Fluid restrictions: 2000 mL Fluid      08/21/21 0823              Discharge Care Instructions  (From admission, onward)           Start     Ordered   08/21/21 0000  Discharge wound care:       Comments: routine   08/21/21 1026           No Known Allergies  Follow-up Information     Hamrick, Lorin Mercy, MD Follow up.   Specialty: Family Medicine Why: appointment arranged for Aug 22, 2021 at 2 pm. Please arrive 20 minutes to update history Contact information: [place] [address] [phone]          [facility] Follow up.   Specialty: Cardiology Why: 08/27/21 at 9:30 AM   Hospital follow-up at the [facility] at [facility], [place] Lemma information: [address] [id number] [place]         [facility] Follow up.   Why: [facility] is provider of IV medication, Dobutamine # [phone]                 The results of significant diagnostics from this hospitalization (including imaging, microbiology, ancillary and laboratory) are listed below for reference.    Significant Diagnostic Studies: DG Chest 2 View  Result Date: 08/09/2021 CLINICAL DATA:  Shortness of breath EXAM: CHEST - 2 VIEW COMPARISON:  04/02/2021 FINDINGS: Left AICD remains in place, unchanged. Prior median sternotomy. Heart is mildly enlarged. Lungs clear. No effusions or edema. No acute bony abnormality. IMPRESSION: Mild cardiomegaly.  No active disease. Electronically Signed   By: Rolm Baptise M.D.   On: 08/09/2021 19:19   IR Fluoro Guide CV Line Right  Result Date: 08/21/2021 INDICATION: 66 year old male with persistent systolic heart failure. He requires intravenous dobutamine and therefore requires durable venous access. Due to underlying chronic kidney disease, a PICC line is suboptimal. Therefore, he presents for placement of a tunneled central venous catheter. EXAM: IR RIGHT FLUORO GUIDE CV LINE MEDICATIONS: None. ANESTHESIA/SEDATION: None. FLUOROSCOPY TIME:  5 mGy air kerma COMPLICATIONS: None immediate. PROCEDURE: Informed written consent was obtained from the patient after a thorough discussion of the procedural risks, benefits and alternatives. All questions were addressed. Maximal Sterile Barrier Technique was utilized including caps, mask, sterile gowns, sterile gloves, sterile drape, hand hygiene and skin antiseptic. A timeout was performed prior to the initiation of the procedure. The right internal jugular vein was interrogated with ultrasound and found to be widely patent. An image was obtained and stored for the medical record. Local anesthesia was attained by infiltration with 1% lidocaine. A small dermatotomy was made. Under real-time sonographic guidance, the vessel was punctured with a 21 gauge  micropuncture needle. Using standard technique, the initial micro needle was exchanged over a 0.018 micro wire for a transitional 4 [place] micro sheath. A peel-away sheath was then advanced over the wire and positioned in the superior vena cava. A suitable skin exit site inferior to the clavicle  was identified. Local anesthesia was attained by infiltration with 1% lidocaine. A small dermatotomy was made. A single-lumen power injectable central venous catheter was then tunneled from the skin exit site to the dermatotomy overlying the venous access site. The catheter was cut to length and advanced through the peel-away sheath. The catheter tip is position in the upper right atrium. A fluoroscopic image was saved and stored for the medical record. The catheter flushes and aspirates easily. The catheter was flushed with heparinized saline, capped and secured to the skin with 0 Prolene suture. Sterile bandages were applied. IMPRESSION: Successful placement of right IJ single-lumen tunneled power line. Catheter tip in the upper right atrium and ready for immediate use. Electronically Signed   By: Jacqulynn Cadet M.D.   On: 08/21/2021 09:05   ECHOCARDIOGRAM COMPLETE  Result Date: 08/10/2021    ECHOCARDIOGRAM REPORT   Patient Name:   BRODEN Boissonneault Date of Exam: 08/10/2021 Medical Rec #:  [id number]      Height:       67.0 in Accession #:    [id number]     Weight:       207.2 lb Date of Birth:  May 16, 1956     BSA:          2.053 m Patient Age:    25 years       BP:           103/75 mmHg Patient Gender: M              HR:           77 bpm. Exam Location:  Inpatient Procedure: 2D Echo, Cardiac Doppler and Color Doppler Indications:    DYSPNEA  History:        Patient has prior history of Echocardiogram examinations, most                 recent 04/01/2021. CAD and Previous Myocardial Infarction; Risk                 Factors:Hypertension and Dyslipidemia. TIA/ RESP. FAILURE.  Sonographer:    Beryle Beams Referring Phys:  [id number] [place]  1. Poor acoustical windows limits accurate assessment of EF and wall motion. Left ventricular endocardial border not optimally defined to evaluate regional wall motion. There is moderate concentric left ventricular hypertrophy. Left ventricular diastolic function could not be evaluated.  2. Right ventricular systolic function is moderately reduced. The right ventricular size is normal. Tricuspid regurgitation signal is inadequate for assessing PA pressure.  3. Left atrial size was severely dilated.  4. Right atrial size was severely dilated.  5. The mitral valve is normal in structure. Mild mitral valve regurgitation. No evidence of mitral stenosis.  6. The aortic valve is tricuspid. Aortic valve regurgitation is trivial. Aortic valve sclerosis/calcification is present, without any evidence of aortic stenosis. Aortic valve area, by VTI measures 4.04 cm. Aortic valve mean gradient measures 1.0 mmHg.  Aortic valve Vmax measures 0.81 m/s.  7. The inferior vena cava is dilated in size with <50% respiratory variability, suggesting right atrial pressure of 15 mmHg.  8. Recommend repeat limited study with definity FINDINGS  Left Ventricle: Left ventricular ejection fraction, by estimation, is Poor acoustical windows limits accurate assessment of EF and wall motion.%. The left ventricle has mildly decreased function. Left ventricular endocardial border not optimally defined  to evaluate regional wall motion. The left ventricular internal cavity size was normal in size. There is moderate concentric left ventricular hypertrophy.  Left ventricular diastolic function could not be evaluated. Right Ventricle: The right ventricular size is normal. No increase in right ventricular wall thickness. Right ventricular systolic function is moderately reduced. Tricuspid regurgitation signal is inadequate for assessing PA pressure. Left Atrium: Left atrial size was severely dilated. Right Atrium:  Right atrial size was severely dilated. Pericardium: There is no evidence of pericardial effusion. Mitral Valve: The mitral valve is normal in structure. Mild to moderate mitral annular calcification. Mild mitral valve regurgitation. No evidence of mitral valve stenosis. MV peak gradient, 55.7 mmHg. The mean mitral valve gradient is 33.0 mmHg. Tricuspid Valve: The tricuspid valve is normal in structure. Tricuspid valve regurgitation is mild . No evidence of tricuspid stenosis. Aortic Valve: The aortic valve is tricuspid. Aortic valve regurgitation is trivial. Aortic valve sclerosis/calcification is present, without any evidence of aortic stenosis. Aortic valve mean gradient measures 1.0 mmHg. Aortic valve peak gradient measures 2.6 mmHg. Aortic valve area, by VTI measures 4.04 cm. Pulmonic Valve: The pulmonic valve was normal in structure. Pulmonic valve regurgitation is trivial. No evidence of pulmonic stenosis. Aorta: The aortic root is normal in size and structure. Venous: The inferior vena cava is dilated in size with less than 50% respiratory variability, suggesting right atrial pressure of 15 mmHg. IAS/Shunts: No atrial level shunt detected by color flow Doppler. Additional Comments: A device lead is visualized.  LEFT VENTRICLE PLAX 2D LVIDd:         4.80 cm      Diastology LVIDs:         3.70 cm      LV e' medial:  4.90 cm/s LV PW:         1.40 cm      LV e' lateral: 6.09 cm/s LV IVS:        1.40 cm LVOT diam:     2.30 cm LV SV:         46 LV SV Index:   22 LVOT Area:     4.15 cm  LV Volumes (MOD) LV vol d, MOD A2C: 184.0 ml LV vol d, MOD A4C: 220.0 ml LV vol s, MOD A2C: 116.0 ml LV vol s, MOD A4C: 118.0 ml LV SV MOD A2C:     68.0 ml LV SV MOD A4C:     220.0 ml LV SV MOD BP:      81.2 ml RIGHT VENTRICLE            IVC RV S prime:     6.15 cm/s  IVC diam: 2.60 cm RVOT diam:      2.70 cm TAPSE (M-mode): 1.2 cm LEFT ATRIUM              Index        RIGHT ATRIUM           Index LA diam:        5.40 cm  2.63  cm/m   RA Area:     26.20 cm LA Vol (A2C):   123.0 ml 59.91 ml/m  RA Volume:   87.50 ml  42.62 ml/m LA Vol (A4C):   106.0 ml 51.63 ml/m LA Biplane Vol: 118.0 ml 57.48 ml/m  AORTIC VALVE                    PULMONIC VALVE AV Area (Vmax):    3.37 cm     PV Vmax:       0.29 m/s AV Area (Vmean):   2.93 cm  PV Vmean:      19.600 cm/s AV Area (VTI):     4.04 cm     PV VTI:        0.047 m AV Vmax:           80.80 cm/s   PV Peak grad:  0.3 mmHg AV Vmean:          55.500 cm/s  PV Mean grad:  0.0 mmHg AV VTI:            0.113 m AV Peak Grad:      2.6 mmHg AV Mean Grad:      1.0 mmHg LVOT Vmax:         65.50 cm/s LVOT Vmean:        39.200 cm/s LVOT VTI:          0.110 m LVOT/AV VTI ratio: 0.97  AORTA Ao Root diam: 3.40 cm Ao Asc diam:  3.50 cm MITRAL VALVE              TRICUSPID VALVE MV Area VTI:  0.44 cm    TV Peak grad:   29.2 mmHg MV Peak grad: 55.7 mmHg   TV Mean grad:   20.0 mmHg MV Mean grad: 33.0 mmHg   TV Vmax:        2.70 m/s MV Vmax:      3.73 m/s    TV Vmean:       213.0 cm/s MV Vmean:     266.0 cm/s  TV VTI:         0.89 msec                            SHUNTS                           Systemic VTI:  0.11 m                           Systemic Diam: 2.30 cm                           Pulmonic Diam: 2.70 cm Fransico Him MD Electronically signed by Fransico Him MD Signature Date/Time: 08/10/2021/9:14:53 AM    Final    [place] EKG SITE RITE  Result Date: 08/13/2021 If Site Rite image not attached, placement could not be confirmed due to current cardiac rhythm.   Microbiology: No results found for this or any previous visit (from the past 240 hour(s)).   Labs: Basic Metabolic Panel: Recent Labs  Lab 08/15/21 0518 08/16/21 0510 08/17/21 2876 08/18/21 0515 08/19/21 0530 08/20/21 0421 08/21/21 0439  NA 138 138 134* 136 135 133* 135  K 3.8 3.4* 3.1* 3.6 3.5 3.7 3.4*  CL 98 93* 86* 92* 92* 89* 90*  CO2 30 34* 33* 32 29 30 33*  GLUCOSE 94 98 206* 159* 95 93 85  BUN 34* 33* 31* 35* 43* 52* 59*   CREATININE 2.44* 2.36* 2.42* 2.53* 3.15* 3.47* 3.12*  CALCIUM 8.6* 9.0 8.9 8.9 8.9 9.2 9.2  MG 1.9 2.3  --   --   --   --  2.4   Liver Function Tests: No results for input(s): AST, ALT, ALKPHOS, BILITOT, PROT, ALBUMIN in the last 168 hours. No results for input(s): LIPASE, AMYLASE in the last 168 hours. No results for input(s):  AMMONIA in the last 168 hours. CBC: Recent Labs  Lab 08/16/21 0510 08/20/21 0421  WBC 9.9 10.6*  HGB 13.7 13.5  HCT 43.1 43.9  MCV 79.2* 79.2*  PLT 354 366   Cardiac Enzymes: No results for input(s): CKTOTAL, CKMB, CKMBINDEX, TROPONINI in the last 168 hours. BNP: BNP (last 3 results) Recent Labs    08/10/21 0218  BNP 2,604.2*    ProBNP (last 3 results) No results for input(s): PROBNP in the last 8760 hours.  CBG: No results for input(s): GLUCAP in the last 168 hours.     Signed:  Domenic Polite MD.  [facility] 08/21/2021, 2:19 PM

## 2021-08-21 NOTE — TOC CM/SW Note (Addendum)
[facility] rep, will arrange [facility] RN with [employer]. Unable to [place] agency due to medication or staffing issues.   [facility]-unable to manage Dobutamine [facility]-unable to manage Dobutamine [facility]- no staffing [facility]-unable to manage Dobutamine [facility]-unable to manage Dobutamine [facility]-insurance not in network [facility]-unable to manage Dobutamine   HF TOC CM received notification from [facility] that they cannot service Dobutamine. Contacted [facility], [facility] and [facility], unable to service. [place], [place], she will follow up. Spoke to [employer], Pam, she can use her agencies [facility] or [facility] for [facility]. [place], Heart Failure TOC CM [phone]

## 2021-08-21 NOTE — Progress Notes (Signed)
[facility] [employer] scheduled on [employer] Ride ID:  [id number]  Transportation Type:  RIDESHARE (UBER/LYFT)  Pickup Date/Time:  08/21/21 - Immediately  [facility] Address  [address] [place], [place] [address], [place] Drop-off Address  [address] [place], [place] [address], [place]  [place] - 3:53pm [place] Bedside RN notified of ride information.  Estreya Clay, MSW, [place] Heart Failure Social Worker

## 2021-08-21 NOTE — Progress Notes (Signed)
Mobility Specialist Progress Note:   08/21/21 1100  Mobility  Activity Ambulated independently in hallway  Level of Assistance Standby assist, set-up cues, supervision of patient - no hands on  Assistive Device Other (Comment) (IV Pole)  Distance Ambulated (ft) 470 ft  Activity Response Tolerated well  $Mobility charge 1 Mobility   Pt asx during ambulation, back in bed with all needs met.   Nelta Numbers Mobility Specialist  Phone [phone]

## 2021-08-21 NOTE — Progress Notes (Signed)
New tunneled CVC site is tender to touch. Previous attempt seems to have been made during the procedure above the new central line. Attempt seems to have been closed skin glue/bandaid and had a bruise present yesterday. This site is tender as well and swollen this morning. Ice given to patient to help minimize swelling.

## 2021-08-21 NOTE — Progress Notes (Signed)
IR has been called to assessed New PICC line site d/t it being swollen and tender to touch.

## 2021-08-21 NOTE — Progress Notes (Signed)
Supervising Physician: Michaelle Birks  Patient Status:  [name] - In-pt  Chief Complaint:  Tenderness and swelling at temp cath site.  Subjective:  [name] expresses tenderness and swelling at the site of temp cath removal from yesterday.  This has persisted through today.  He states he has used some cold presses in the last day.  No bleeding from site.  No fever or chills.    Allergies: Patient has no known allergies.  Medications: Prior to Admission medications   Medication Sig Start Date End Date Taking? Authorizing Provider  apixaban (ELIQUIS) 5 MG TABS tablet Take 1 tablet (5 mg total) by mouth 2 (two) times daily. 09/06/20  Yes Croitoru, Mihai, MD  atorvastatin (LIPITOR) 80 MG tablet TAKE 1 TABLET BY MOUTH DAILY AT 6:00 PM 04/19/21  Yes Croitoru, Mihai, MD  clopidogrel (PLAVIX) 75 MG tablet Take 1 tablet (75 mg total) by mouth daily. 04/16/21  Yes Duke, Tami Lin, PA  dapagliflozin propanediol (FARXIGA) 10 MG TABS tablet Take 1 tablet (10 mg total) by mouth daily. 04/04/21  Yes Bhagat, Bhavinkumar, PA  ENTRESTO 24-26 MG TAKE 1 TABLET BY MOUTH 2 TIMES DAILY. 08/07/21  Yes Croitoru, Mihai, MD  escitalopram (LEXAPRO) 10 MG tablet Take 1 tablet (10 mg total) by mouth daily. 06/29/20  Yes Angiulli, Lavon Paganini, PA-C  ezetimibe (ZETIA) 10 MG tablet Take 10 mg by mouth daily.   Yes [provider]  Multiple Vitamins-Minerals (ONE-A-DAY MENS 50+ ADVANTAGE) TABS Take 1 tablet by mouth daily with breakfast.   Yes [provider]  nystatin (MYCOSTATIN/NYSTOP) powder APPLY TOPICALLY TO THE AFFECTED AREA 2 TIMES A DAY FOR 2 WEEKS Patient taking differently: 1 application 2 (two) times daily as needed (rash). 04/16/21  Yes Duke, Tami Lin, PA  spironolactone (ALDACTONE) 25 MG tablet Take 1/2 tablet (12.5 mg total) by mouth daily. 04/04/21  Yes Bhagat, Bhavinkumar, PA  amiodarone (PACERONE) 200 MG tablet TAKE 1 TABLET BY MOUTH 2 TIMES DAILY. Patient not taking: Reported on  04/16/2021 03/05/21   Croitoru, Mihai, MD  DOBUTamine (DOBUTREX) 4-5 MG/ML-% infusion Inject 193.5 mcg/min into the vein continuous. Per [facility] infusion 08/21/21   Domenic Polite, MD  ezetimibe (ZETIA) 10 MG tablet Take 1 tablet (10 mg total) by mouth daily. 04/16/21 07/15/21  Ledora Bottcher, PA  metoprolol succinate (TOPROL-XL) 25 MG 24 hr tablet Take 1 tablet (25 mg total) by mouth daily. Patient not taking: Reported on 08/09/2021 04/03/21   Leanor Kail, PA  midodrine (PROAMATINE) 5 MG tablet Take 3 tablets (15 mg total) by mouth 3 (three) times daily with meals. 08/21/21   Domenic Polite, MD  nitroGLYCERIN (NITROSTAT) 0.4 MG SL tablet Place 1 tablet (0.4 mg total) under the tongue every 5 (five) minutes x 3 doses as needed for chest pain. 06/29/20   Angiulli, Lavon Paganini, PA-C  potassium chloride 20 MEQ TBCR Take 20 mEq by mouth 2 (two) times daily. 08/21/21   Domenic Polite, MD  torsemide (DEMADEX) 20 MG tablet Take 2 tablets (40 mg total) by mouth daily. 08/21/21   Domenic Polite, MD     Vital Signs: BP 108/80 (BP Location: Left Arm)    Pulse 75    Temp 98.1 F (36.7 C) (Oral)    Resp 18    Ht 5\' 7"  (1.702 m)    Wt 170 lb 11.2 oz (77.4 kg)    SpO2 96%    BMI 26.74 kg/m   Physical Exam Constitutional:  Appearance: Normal appearance.  HENT:     Head: Normocephalic and atraumatic.     Mouth/Throat:     Pharynx: Oropharynx is clear.  Eyes:     Extraocular Movements: Extraocular movements intact.  Cardiovascular:     Rate and Rhythm: Normal rate.     Pulses: Normal pulses.  Pulmonary:     Effort: Pulmonary effort is normal. No respiratory distress.  Abdominal:     General: Abdomen is flat.     Palpations: Abdomen is soft.  Skin:    General: Skin is warm and dry.     Comments: Catheter site from temp cath with hematoma approx. 3cm x 3cm.  No active bleeding.  Swollen tissue around site is soft and mildly tender to the touch.  Neurological:     Mental Status: He is alert.     Imaging: IR Fluoro Guide CV Line Right  Result Date: 08/21/2021 INDICATION: 66 year old male with persistent systolic heart failure. He requires intravenous dobutamine and therefore requires durable venous access. Due to underlying chronic kidney disease, a PICC line is suboptimal. Therefore, he presents for placement of a tunneled central venous catheter. EXAM: IR RIGHT FLUORO GUIDE CV LINE MEDICATIONS: None. ANESTHESIA/SEDATION: None. FLUOROSCOPY TIME:  5 mGy air kerma COMPLICATIONS: None immediate. PROCEDURE: Informed written consent was obtained from the patient after a thorough discussion of the procedural risks, benefits and alternatives. All questions were addressed. Maximal Sterile Barrier Technique was utilized including caps, mask, sterile gowns, sterile gloves, sterile drape, hand hygiene and skin antiseptic. A timeout was performed prior to the initiation of the procedure. The right internal jugular vein was interrogated with ultrasound and found to be widely patent. An image was obtained and stored for the medical record. Local anesthesia was attained by infiltration with 1% lidocaine. A small dermatotomy was made. Under real-time sonographic guidance, the vessel was punctured with a 21 gauge micropuncture needle. Using standard technique, the initial micro needle was exchanged over a 0.018 micro wire for a transitional 4 [place] micro sheath. A peel-away sheath was then advanced over the wire and positioned in the superior vena cava. A suitable skin exit site inferior to the clavicle was identified. Local anesthesia was attained by infiltration with 1% lidocaine. A small dermatotomy was made. A single-lumen power injectable central venous catheter was then tunneled from the skin exit site to the dermatotomy overlying the venous access site. The catheter was cut to length and advanced through the peel-away sheath. The catheter tip is position in the upper right atrium. A fluoroscopic image was  saved and stored for the medical record. The catheter flushes and aspirates easily. The catheter was flushed with heparinized saline, capped and secured to the skin with 0 Prolene suture. Sterile bandages were applied. IMPRESSION: Successful placement of right IJ single-lumen tunneled power line. Catheter tip in the upper right atrium and ready for immediate use. Electronically Signed   By: Jacqulynn Cadet M.D.   On: 08/21/2021 09:05    Labs:  CBC: Recent Labs    08/10/21 0219 08/14/21 0500 08/16/21 0510 08/20/21 0421  WBC 10.8* 9.6 9.9 10.6*  HGB 13.1 12.8* 13.7 13.5  HCT 40.5 40.5 43.1 43.9  PLT 262 287 354 366    COAGS: Recent Labs    03/31/21 0752 04/01/21 0045 04/01/21 0809  INR 1.0  --   --   APTT 29 51* 52*    BMP: Recent Labs    08/18/21 0515 08/19/21 0530 08/20/21 0421 08/21/21 0439  NA 136  135 133* 135  K 3.6 3.5 3.7 3.4*  CL 92* 92* 89* 90*  CO2 32 29 30 33*  GLUCOSE 159* 95 93 85  BUN 35* 43* 52* 59*  CALCIUM 8.9 8.9 9.2 9.2  CREATININE 2.53* 3.15* 3.47* 3.12*  GFRNONAA 27* 21* 19* 21*    LIVER FUNCTION TESTS: Recent Labs    02/06/21 1145 03/31/21 0752 04/02/21 1828  BILITOT 0.6 1.1 1.3*  AST 21 36 131*  ALT 18 23 34  ALKPHOS 100 72 56  PROT 7.4 7.5 6.4*  ALBUMIN 4.1 4.0 3.1*    Assessment and Plan:  Hematoma --at site of cath removal, R neck --on Eliquis --cold vs. heat pack, patient preference. --may follow up with PCP upon discharge.    Electronically Signed: Pasty Spillers, PA 08/21/2021, 1:00 PM   I spent a total of 15 Minutes at the the patient's bedside AND on the patient's [name] floor or unit, greater than 50% of which was counseling/coordinating care for hematoma

## 2021-08-22 ENCOUNTER — Encounter (HOSPITAL_COMMUNITY): Payer: Self-pay | Admitting: Radiology

## 2021-08-22 ENCOUNTER — Other Ambulatory Visit (HOSPITAL_COMMUNITY): Payer: Self-pay | Admitting: Adult Health

## 2021-08-22 DIAGNOSIS — I255 Ischemic cardiomyopathy: Secondary | ICD-10-CM

## 2021-08-23 NOTE — Progress Notes (Addendum)
Error

## 2021-08-25 ENCOUNTER — Telehealth: Payer: Self-pay | Admitting: Cardiology

## 2021-08-25 NOTE — Telephone Encounter (Signed)
Pt has a bleed around central cath site.  Unable to add pressure dressing to to site.  It is described as constant drip.  I discussed with Dr. Haroldine Laws and we will hold eliquis for 24 hours but if still bleeding asked to come to er for possible stitch.   I let [place] she will discuss with pt

## 2021-08-27 ENCOUNTER — Encounter (HOSPITAL_COMMUNITY): Payer: Self-pay

## 2021-08-27 ENCOUNTER — Ambulatory Visit (HOSPITAL_COMMUNITY)
Admit: 2021-08-27 | Discharge: 2021-08-27 | Disposition: A | Discharge status: Home / Self Care | Payer: Medicare PPO | Attending: Family Medicine | Admitting: Family Medicine

## 2021-08-27 ENCOUNTER — Other Ambulatory Visit: Payer: Self-pay

## 2021-08-27 VITALS — BP 112/90 | HR 87 | Wt 177.2 lb

## 2021-08-27 DIAGNOSIS — Z8673 Personal history of transient ischemic attack (TIA), and cerebral infarction without residual deficits: Secondary | ICD-10-CM | POA: Insufficient documentation

## 2021-08-27 DIAGNOSIS — Z95810 Presence of automatic (implantable) cardiac defibrillator: Secondary | ICD-10-CM | POA: Diagnosis not present

## 2021-08-27 DIAGNOSIS — Z66 Do not resuscitate: Secondary | ICD-10-CM | POA: Insufficient documentation

## 2021-08-27 DIAGNOSIS — I5042 Chronic combined systolic (congestive) and diastolic (congestive) heart failure: Secondary | ICD-10-CM | POA: Diagnosis not present

## 2021-08-27 DIAGNOSIS — I959 Hypotension, unspecified: Secondary | ICD-10-CM | POA: Insufficient documentation

## 2021-08-27 DIAGNOSIS — N1832 Chronic kidney disease, stage 3b: Secondary | ICD-10-CM | POA: Diagnosis not present

## 2021-08-27 DIAGNOSIS — N179 Acute kidney failure, unspecified: Secondary | ICD-10-CM | POA: Insufficient documentation

## 2021-08-27 DIAGNOSIS — I252 Old myocardial infarction: Secondary | ICD-10-CM | POA: Diagnosis not present

## 2021-08-27 DIAGNOSIS — Z7901 Long term (current) use of anticoagulants: Secondary | ICD-10-CM | POA: Diagnosis not present

## 2021-08-27 DIAGNOSIS — Z955 Presence of coronary angioplasty implant and graft: Secondary | ICD-10-CM | POA: Insufficient documentation

## 2021-08-27 DIAGNOSIS — I5022 Chronic systolic (congestive) heart failure: Secondary | ICD-10-CM | POA: Insufficient documentation

## 2021-08-27 DIAGNOSIS — Z86718 Personal history of other venous thrombosis and embolism: Secondary | ICD-10-CM | POA: Insufficient documentation

## 2021-08-27 DIAGNOSIS — K625 Hemorrhage of anus and rectum: Secondary | ICD-10-CM | POA: Diagnosis not present

## 2021-08-27 DIAGNOSIS — Z8674 Personal history of sudden cardiac arrest: Secondary | ICD-10-CM | POA: Insufficient documentation

## 2021-08-27 DIAGNOSIS — Z79899 Other long term (current) drug therapy: Secondary | ICD-10-CM | POA: Diagnosis not present

## 2021-08-27 DIAGNOSIS — I130 Hypertensive heart and chronic kidney disease with heart failure and stage 1 through stage 4 chronic kidney disease, or unspecified chronic kidney disease: Secondary | ICD-10-CM | POA: Diagnosis present

## 2021-08-27 DIAGNOSIS — I5082 Biventricular heart failure: Secondary | ICD-10-CM

## 2021-08-27 DIAGNOSIS — I2510 Atherosclerotic heart disease of native coronary artery without angina pectoris: Secondary | ICD-10-CM | POA: Insufficient documentation

## 2021-08-27 DIAGNOSIS — I493 Ventricular premature depolarization: Secondary | ICD-10-CM | POA: Insufficient documentation

## 2021-08-27 DIAGNOSIS — Z8774 Personal history of (corrected) congenital malformations of heart and circulatory system: Secondary | ICD-10-CM | POA: Diagnosis not present

## 2021-08-27 DIAGNOSIS — I428 Other cardiomyopathies: Secondary | ICD-10-CM | POA: Diagnosis not present

## 2021-08-27 DIAGNOSIS — Z86711 Personal history of pulmonary embolism: Secondary | ICD-10-CM | POA: Diagnosis not present

## 2021-08-27 DIAGNOSIS — Z951 Presence of aortocoronary bypass graft: Secondary | ICD-10-CM | POA: Diagnosis not present

## 2021-08-27 DIAGNOSIS — I5084 End stage heart failure: Secondary | ICD-10-CM | POA: Insufficient documentation

## 2021-08-27 DIAGNOSIS — N183 Chronic kidney disease, stage 3 unspecified: Secondary | ICD-10-CM

## 2021-08-27 DIAGNOSIS — I513 Intracardiac thrombosis, not elsewhere classified: Secondary | ICD-10-CM

## 2021-08-27 DIAGNOSIS — Z7902 Long term (current) use of antithrombotics/antiplatelets: Secondary | ICD-10-CM | POA: Insufficient documentation

## 2021-08-27 DIAGNOSIS — I25810 Atherosclerosis of coronary artery bypass graft(s) without angina pectoris: Secondary | ICD-10-CM | POA: Diagnosis not present

## 2021-08-27 DIAGNOSIS — Z8679 Personal history of other diseases of the circulatory system: Secondary | ICD-10-CM

## 2021-08-27 DIAGNOSIS — E1122 Type 2 diabetes mellitus with diabetic chronic kidney disease: Secondary | ICD-10-CM | POA: Insufficient documentation

## 2021-08-27 LAB — BASIC METABOLIC PANEL
Anion gap: 10 (ref 5–15)
BUN: 40 mg/dL — ABNORMAL HIGH (ref 8–23)
CO2: 24 mmol/L (ref 22–32)
Calcium: 9.4 mg/dL (ref 8.9–10.3)
Chloride: 100 mmol/L (ref 98–111)
Creatinine, Ser: 2.09 mg/dL — ABNORMAL HIGH (ref 0.61–1.24)
GFR, Estimated: 34 mL/min — ABNORMAL LOW (ref >60)
Glucose, Bld: 105 mg/dL — ABNORMAL HIGH (ref 70–99)
Potassium: 5.4 mmol/L — ABNORMAL HIGH (ref 3.5–5.1)
Sodium: 134 mmol/L — ABNORMAL LOW (ref 135–145)

## 2021-08-27 LAB — CBC
HCT: 44.2 % (ref 39.0–52.0)
Hemoglobin: 13.7 g/dL (ref 13.0–17.0)
MCH: 25.1 pg — ABNORMAL LOW (ref 26.0–34.0)
MCHC: 31 g/dL (ref 30.0–36.0)
MCV: 81.1 fL (ref 80.0–100.0)
Platelets: 309 10*3/uL (ref 150–400)
RBC: 5.45 MIL/uL (ref 4.22–5.81)
RDW: 19.8 % — ABNORMAL HIGH (ref 11.5–15.5)
WBC: 11.3 10*3/uL — ABNORMAL HIGH (ref 4.0–10.5)
nRBC: 0 % (ref 0.0–0.2)

## 2021-08-27 LAB — MAGNESIUM: Magnesium: 2.1 mg/dL (ref 1.7–2.4)

## 2021-08-27 MED ORDER — TORSEMIDE 20 MG PO TABS: 60.0000 mg | ORAL_TABLET | Freq: Every day | ORAL | 4 refills | Status: DC

## 2021-08-27 MED ORDER — MIDODRINE HCL 5 MG PO TABS: 5.0000 mg | ORAL_TABLET | Freq: Three times a day (TID) | ORAL | 4 refills | Status: DC

## 2021-08-27 NOTE — Patient Instructions (Addendum)
Thank you for coming in today  Labs were done today, if any labs are abnormal the clinic will call you BMET Mag Dig level  DECREASE Midodrine to 5 mg 1 tablet three times daily   INCREASE Torsemide to 60 mg 3 tablets daily  PLEASE BRING YOUR CELL PHONE TO THE NEXT OFFICE VISIT  Please call Dr. Mardene Speak office to make a follow up appointment [phone]  Your physician recommends that you schedule a follow-up appointment in:   3 weeks in clinic  2 months with Dr. Haroldine Laws  At the [facility], you and your health needs are our priority. As part of our continuing mission to provide you with exceptional heart care, we have created designated Provider Care Teams. These Care Teams include your primary Cardiologist (physician) and Advanced Practice Providers (APPs- Physician Assistants and Nurse Practitioners) who all work together to provide you with the care you need, when you need it.   You may see any of the following providers on your designated Care Team at your next follow up: Dr Glori Bickers Dr Haynes Kerns, NP Lyda Jester, [place] [facility] [place], [place] Audry Riles, PharmD   Please be sure to bring in all your medications bottles to every appointment.   If you have any questions or concerns before your next appointment please send [place] a message through [place] or call our office at [phone].    TO LEAVE A MESSAGE FOR THE NURSE SELECT OPTION 2, PLEASE LEAVE A MESSAGE INCLUDING: YOUR NAME DATE OF BIRTH CALL BACK NUMBER REASON FOR CALL**this is important as we prioritize the call backs  YOU WILL RECEIVE A CALL BACK THE SAME DAY AS LONG AS YOU CALL BEFORE 4:00 PM

## 2021-08-27 NOTE — Addendum Note (Signed)
Encounter addended by: Rafael Bihari, FNP on: 08/27/2021 10:57 AM  Actions taken: Clinical Note Signed

## 2021-08-27 NOTE — Progress Notes (Signed)
ADVANCED HF CLINIC CONSULT NOTE  Primary Care: Hamrick, Lorin Mercy, MD HF Cardiologist: Dr. Haroldine Laws  HPI: [name] is a 66 y.o. male with history of CAD s/p CABG in 2014 (LIMA to LAD, SVG to RCA, SVG to OM2) followed by inferior STEMI 09/20 s/p DES SVG to RCA anastomosis and STEMI 10/21 s/p POBA SVG to PDA, hx VT arrest (suspected scar mediated) s/p dual-chamber ICD, chronic systolic CHF/ICM, hx CVA, hx bilateral PE and b/l DVT, hx possible LV thrombus.   Admitted 11/21 with VT that converted to asystole. Required external pacing. Cardiac cath with stable CAD. EF 30-35%. Seen by AHF service at that time. Underwent dual chamber ICD during admit.    Admitted 09/22 with inferoposterior STEMI. Emergent cath showed thrombotic occlusion of SVG to OM1. SVG to RCA patent, unable to assess LIMA to LAD. He was treated with Aggrastat followed by aspirin, plavix and Eliquis. EF 20-25% on echo that admit.    He was admitted 1/23 with acute bi-ventricular failure. Attempted to diurese with IV lasix but developed hypotension. Entresto, spiro and metoprolol held. Continued on SGLT2i. Scr up from 1.45 >> 2.35 on admit, concern for cardiorenal/low output. Echo showed EF 20-25%, moderate RV dysfunction. AHF consulted. Started on DBA 2.5. DBA stopped and renal function and Co-Ox declined. Patient not interested in advanced therapies, home DBA arranged. PMT consulted and he was a DNR/DNI. Discharged home on midodrine 15 tid and torsemide 40 daily via tunneled PICC, weight 170 lbs.   Today he returns for post hospital HF follow up with his wife. Main complaint is food doesn't taste good. He is not SOB walking on flat ground if he takes his time. He has not tried to walk up stairs. He has noticed occasional BRBPR after BMs. Denies dizziness, CP, dizziness, edema, or PND/Orthopnea. Appetite poor. No fever or chills. Weight at home 177 pounds. Has not been on midodrine since DC, pharmacy dd not have med. PICC RN comes  out once a week, no bleeding issues with PICC. He is a retired Administrator. Lives at home with his wife near [place]. Has a daughter.   Cardiac Studies: - Echo (11/21): EF 30-35%  - Echo (9/22): EF 20-25%   - Echo (1/23): 20-25%, moderate RV dysfunction, severe bi-atrial dilation, mild MR   Review of Systems: [y] = yes, [ ]  = no   General: Weight gain [ ] ; Weight loss [ ] ; Anorexia [ y]; Fatigue Blue.Reese ]; Fever [ ] ; Chills [ ] ; Weakness Blue.Reese ]  Cardiac: Chest pain/pressure [ ] ; Resting SOB [ ] ; Exertional SOB [ ] ; Orthopnea [ ] ; Pedal Edema [ ] ; Palpitations [ ] ; Syncope [ ] ; Presyncope [ ] ; Paroxysmal nocturnal dyspnea[ ]   Pulmonary: Cough [ ] ; Wheezing[ ] ; Hemoptysis[ ] ; Sputum [ ] ; Snoring [ ]   GI: Vomiting[ ] ; Dysphagia[ ] ; Melena[ ] ; Hematochezia [ ] ; Heartburn[ ] ; Abdominal pain [ ] ; Constipation [ ] ; Diarrhea [ ] ; BRBPR Blue.Reese ]  GU: Hematuria[ ] ; Dysuria [ ] ; Nocturia[ ]   Vascular: Pain in legs with walking [ ] ; Pain in feet with lying flat [ ] ; Non-healing sores [ ] ; Stroke [ ] ; TIA [ ] ; Slurred speech [ ] ;  Neuro: Headaches[ ] ; Vertigo[ ] ; Seizures[ ] ; Paresthesias[ ] ;Blurred vision [ ] ; Diplopia [ ] ; Vision changes [ ]   Ortho/Skin: Arthritis [ ] ; Joint pain [ ] ; Muscle pain [ ] ; Joint swelling [ ] ; Back Pain [ ] ; Rash [ ]   Psych: Depression[ ] ; Anxiety[ ]   Heme: Bleeding  problems [ ] ; Clotting disorders [ ] ; Anemia [ ]   Endocrine: Diabetes [ ] ; Thyroid dysfunction[ ]   Past Medical History:  Diagnosis Date   Acute pulmonary embolism without acute cor pulmonale (HCC)    PE-DVT during hospitalization Nov-Dec 2021 On Eliquis at discharge   Acute ST elevation myocardial infarction (STEMI) involving other coronary artery of inferior wall ([place]) 03/31/2021   Acute ST elevation myocardial infarction (STEMI) of inferior wall ([place]) 03/31/2021   Borderline diabetic    Cardiogenic shock (HCC)    Coronary artery disease    a. s/p CABG in 2014 with LIMA-LAD, SVG-OM2 and SVG-RC b. 03/2019: STEMI and  s/p angioplasty to proximal SVG-RCA and DES to SVG-RCA anastomosis c. 04/2020: recurrent STEMI with angioplasty of SVG-RCA   Dyslipidemia 01/11/2013   Hiatal hernia    Hypertension    Mini stroke    2   Respiratory failure with hypoxia (HCC)    S/P CABG x 3    Sleep disturbance    ST elevation myocardial infarction (STEMI) ([place]) 09/01/2012   Pt presented 03/23/2019 with inferior STEMI complicated by respiratory failure- intubation   STEMI (ST elevation myocardial infarction) ([place])    Stroke (HCC)    Transaminitis     Current Outpatient Medications  Medication Sig Dispense Refill   amiodarone (PACERONE) 200 MG tablet TAKE 1 TABLET BY MOUTH 2 TIMES DAILY. 60 tablet 0   apixaban (ELIQUIS) 5 MG TABS tablet Take 1 tablet (5 mg total) by mouth 2 (two) times daily. 180 tablet 1   atorvastatin (LIPITOR) 80 MG tablet TAKE 1 TABLET BY MOUTH DAILY AT 6:00 PM 90 tablet 3   clopidogrel (PLAVIX) 75 MG tablet Take 1 tablet (75 mg total) by mouth daily. 90 tablet 3   DOBUTamine (DOBUTREX) 4-5 MG/ML-% infusion Inject 193.5 mcg/min into the vein continuous. Per [facility] infusion 250 mL 12   escitalopram (LEXAPRO) 10 MG tablet Take 1 tablet (10 mg total) by mouth daily. 30 tablet 11   ezetimibe (ZETIA) 10 MG tablet Take 1 tablet (10 mg total) by mouth daily. 90 tablet 3   ezetimibe (ZETIA) 10 MG tablet Take 10 mg by mouth daily.     Multiple Vitamins-Minerals (ONE-A-DAY MENS 50+ ADVANTAGE) TABS Take 1 tablet by mouth daily with breakfast.     nitroGLYCERIN (NITROSTAT) 0.4 MG SL tablet Place 1 tablet (0.4 mg total) under the tongue every 5 (five) minutes x 3 doses as needed for chest pain. 25 tablet 2   nystatin (MYCOSTATIN/NYSTOP) powder APPLY TOPICALLY TO THE AFFECTED AREA 2 TIMES A DAY FOR 2 WEEKS (Patient taking differently: 1 application 2 (two) times daily as needed (rash).) 30 g 0   potassium chloride 20 MEQ TBCR Take 20 mEq by mouth 2 (two) times daily. 30 tablet 6   torsemide (DEMADEX) 20 MG tablet Take 2  tablets (40 mg total) by mouth daily. 60 tablet 6   midodrine (PROAMATINE) 5 MG tablet Take 3 tablets (15 mg total) by mouth 3 (three) times daily with meals. (Patient not taking: Reported on 08/27/2021) 90 tablet 6   No current facility-administered medications for this encounter.    No Known Allergies    Social History   Socioeconomic History   Marital status: Married    Spouse name: [name]   Number of children: 2   Years of education: Not on file   Highest education level: 11th grade  Occupational History   Occupation: retired    Comment: large Database administrator  Tobacco Use  Smoking status: Former    Types: Pipe   Smokeless tobacco: Never  Vaping Use   Vaping Use: Never used  Substance and Sexual Activity   Alcohol use: No   Drug use: No   Sexual activity: Not Currently  Other Topics Concern   Not on file  Social History Narrative   Not on file   Social Determinants of Health   Financial Resource Strain: Medium Risk   Difficulty of Paying Living Expenses: Somewhat hard  Food Insecurity: No Food Insecurity   Worried About Charity fundraiser in the Last Year: Never true   Ran Out of Food in the Last Year: Never true  Transportation Needs: No Transportation Needs   Lack of Transportation (Medical): No   Lack of Transportation (Non-Medical): No  Physical Activity: Not on file  Stress: Not on file  Social Connections: Not on file  Intimate Partner Violence: Not on file   Family History  Problem Relation Age of Onset   Cancer Father    Hypertension Father    Hyperlipidemia Father    Heart attack Father    Hypertension Sister    Hypertension Brother    Hyperlipidemia Brother    Heart attack Sister    Heart attack Brother    BP 112/90    Pulse 87    Wt 80.4 kg    SpO2 97%    BMI 27.75 kg/m   Wt Readings from Last 3 Encounters:  08/27/21 80.4 kg  08/21/21 77.4 kg  04/16/21 94 kg   PHYSICAL EXAM: General:  NAD. No resp difficulty,  fatigued-appearing HEENT: Normal Neck: Supple. No JVD. Carotids 2+ bilat; no bruits. No lymphadenopathy or thryomegaly appreciated. Cor: PMI nondisplaced. Regular rate & rhythm. No rubs, gallops, 2/6 mR Lungs: Clear; Right chest PICC looks ok, old sanguinous drainage under dressing. Abdomen: Soft, nontender, nondistended. No hepatosplenomegaly. No bruits or masses. Good bowel sounds. Extremities: No cyanosis, clubbing, rash, edema. Neuro: Alert & oriented x 3, cranial nerves grossly intact. Moves all 4 extremities w/o difficulty. Affect pleasant.  ECG: SR with PVCs 83 bpm (personally reviewed)  Device interrogation (device not compatible with our remote monitor): Device rep interrogated at visit today, showing 3% v-pacing , CoreVue headed down towards reference impedence suggesting fluid starting to accumulate, no VT.  ASSESSMENT & PLAN: Chronic systolic CHF with biventricular failure/ICM: - Echo 05/04/20 EF 25%. Moderate RV to severe RV dysfunction and possible LV thrombus.  - [place] 10/21 with preserved output and well compensated filling pressures.  - Echo 11/21 EF 25-30%  - Echo 01/23: EF 20-25% Moderate RV dysfunction.  - Started on DBA 2.5 during 1/23 admission, failed wean and continued palliative home inotrope.  - NYHA III, functional status difficult due to general deconditioning. Volume mildly up on exam and by CoreVue. - Increase torsemide to 60 mg daily - Decrease midodrine to 5 mg tid (he has been off since discharge, will see  how he does on reduced dose, may  be able to stop next visit if SBP >110). - Off Entresto and spiro with AKI/hypotension. - SGLT2i stopped with AKI. - Off beta blocker with a/c CHF. - He has end-stage HF due to iCM.  He is not interested in advanced therapies (VAD/transplant). - Labs today, follow kidney function with home health labs.   2. CAD: - s/p CABG 2014 - Inferior STEMI 09/20 s/p DES SVG to RCA anastomosis - Inferior STEMI 05/03/20 s/p POBA  SVG->PDA - Inferoposterior STEMI 09/22 >> thrombotic occlusion  SVG to OM1 - No s/s angina, - On plavix. No ASA d/t anticoagulation.  - Continue atorvastatin 80.   3. Hx VT arrest/frequent PVCs: - S/p dual chamber ICD placement - No VT on device interrogation    - Continue amiodarone 200 mg bid - Check labs. - He needs follow up with EP.   4. Hx Bilateral PE/b/l DVT: - Anticoagulated with Eliquis. - CBC today.   5. Possible LV thrombus: - Noted on echo 10/21, TEE 11/21 with no thrombus.   6. CKD IIIb: - Baseline Scr previously 1.4>>3.12 on discharge. - On DBA 2.5 - Continue midodrine to help w/ renal perfusion and keep SBPs > 110    7. DNR/DNI   Follow up in 3 weeks with APP (stop midodrine and add back GDMT as able) and 8 weeks with Dr. Joyce Gross, FNP-BC 08/27/21

## 2021-08-28 ENCOUNTER — Other Ambulatory Visit (HOSPITAL_COMMUNITY): Payer: Self-pay | Admitting: Family Medicine

## 2021-08-28 MED ORDER — POTASSIUM CHLORIDE ER 20 MEQ PO TBCR: 20.0000 meq | EXTENDED_RELEASE_TABLET | Freq: Every day | ORAL | 6 refills | Status: DC

## 2021-09-03 ENCOUNTER — Other Ambulatory Visit (HOSPITAL_COMMUNITY): Payer: Self-pay | Admitting: Internal Medicine

## 2021-09-06 NOTE — Progress Notes (Signed)
Cardiology Clinic Note   Patient Name: [name] Date of Encounter: 09/07/2021  Primary Care Provider:  [name], MD Primary Cardiologist:  Sanda Klein, MD  Patient Profile    66 year old male with severe coronary artery disease, status post CABG in 2014, with multiple subsequent MI/PCI's; ischemic cardiomyopathy with HFrEF EF 20 to 25%, status post dual-chamber ICD pacemaker placement, history of bilateral PE/DVT on Eliquis, chronic kidney disease 3B, recent discharge on 08/21/2021 for acute on chronic systolic CHF with biventricular failure.  Followed by Dr. Haroldine Laws, [facility] in addition to general cardiology.   Past Medical History    Past Medical History:  Diagnosis Date   Acute pulmonary embolism without acute cor pulmonale (HCC)    PE-DVT during hospitalization Nov-Dec 2021 On Eliquis at discharge   Acute ST elevation myocardial infarction (STEMI) involving other coronary artery of inferior wall (HCC) 03/31/2021   Acute ST elevation myocardial infarction (STEMI) of inferior wall ([place]) 03/31/2021   Borderline diabetic    Cardiogenic shock (HCC)    Coronary artery disease    a. s/p CABG in 2014 with LIMA-LAD, SVG-OM2 and SVG-RC b. 03/2019: STEMI and s/p angioplasty to proximal SVG-RCA and DES to SVG-RCA anastomosis c. 04/2020: recurrent STEMI with angioplasty of SVG-RCA   Dyslipidemia 01/11/2013   Hiatal hernia    Hypertension    Mini stroke    2   Respiratory failure with hypoxia (HCC)    S/P CABG x 3    Sleep disturbance    ST elevation myocardial infarction (STEMI) ([place]) 09/01/2012   Pt presented 03/23/2019 with inferior STEMI complicated by respiratory failure- intubation   STEMI (ST elevation myocardial infarction) ([place])    Stroke ([place])    Transaminitis    Past Surgical History:  Procedure Laterality Date   CARDIAC CATHETERIZATION     CORONARY ARTERY BYPASS GRAFT N/A 09/01/2012   Procedure: CORONARY ARTERY BYPASS GRAFTING (CABG);   Surgeon: Gaye Pollack, MD;  Location: [facility];  Service: Open Heart Surgery;  Laterality: N/A;  Coronary Artery Bypass Grafting Times Three Using Left Internal Mammary Artery and Left Saphenous leg Vein Harvested Endoscopically   CORONARY STENT INTERVENTION N/A 03/23/2019   Procedure: CORONARY STENT INTERVENTION;  Surgeon: Lorretta Harp, MD;  Location: [place] [facility];  Service: Cardiovascular;  Laterality: N/A;   CORONARY/GRAFT ACUTE MI REVASCULARIZATION N/A 03/23/2019   Procedure: Coronary/Graft Acute MI Revascularization;  Surgeon: Lorretta Harp, MD;  Location: [place];  Service: Cardiovascular;  Laterality: N/A;   CORONARY/GRAFT ACUTE MI REVASCULARIZATION N/A 05/03/2020   Procedure: Coronary/Graft Acute MI Revascularization;  Surgeon: Belva Crome, MD;  Location: [facility];  Service: Cardiovascular;  Laterality: N/A;   IABP INSERTION N/A 06/04/2020   Procedure: IABP Insertion;  Surgeon: Martinique, Peter M, MD;  Location: [facility] [facility];  Service: Cardiovascular;  Laterality: N/A;   ICD IMPLANT N/A 06/19/2020   Procedure: ICD IMPLANT;  Surgeon: Vickie Epley, MD;  Location: [place] [facility];  Service: Cardiovascular;  Laterality: N/A;   INTRAOPERATIVE TRANSESOPHAGEAL ECHOCARDIOGRAM N/A 09/01/2012   Procedure: INTRAOPERATIVE TRANSESOPHAGEAL ECHOCARDIOGRAM;  Surgeon: Gaye Pollack, MD;  Location: [facility];  Service: Open Heart Surgery;  Laterality: N/A;   IR FLUORO GUIDE CV LINE RIGHT  08/20/2021   IR US GUIDE VASC ACCESS RIGHT  08/20/2021   LEFT HEART CATH AND CORONARY ANGIOGRAPHY N/A 03/23/2019   Procedure: LEFT HEART CATH AND CORONARY ANGIOGRAPHY;  Surgeon: Lorretta Harp, MD;  Location: [facility];  Service: Cardiovascular;  Laterality: N/A;   LEFT HEART CATH AND CORONARY ANGIOGRAPHY N/A 05/03/2020   Procedure: LEFT HEART CATH AND CORONARY ANGIOGRAPHY;  Surgeon: Belva Crome, MD;  Location: [facility];  Service: Cardiovascular;  Laterality: N/A;    LEFT HEART CATH AND CORS/GRAFTS ANGIOGRAPHY N/A 03/31/2021   Procedure: LEFT HEART CATH AND CORS/GRAFTS ANGIOGRAPHY;  Surgeon: Leonie Man, MD;  Location: [place] [facility];  Service: Cardiovascular;  Laterality: N/A;   LEFT HEART CATHETERIZATION WITH CORONARY ANGIOGRAM N/A 08/31/2012   Procedure: LEFT HEART CATHETERIZATION WITH CORONARY ANGIOGRAM;  Surgeon: Peter M Martinique, MD;  [facility];  Service: Cardiovascular;  Laterality: N/A;   PERCUTANEOUS CORONARY STENT INTERVENTION (PCI-S) N/A 08/31/2012   Procedure: PERCUTANEOUS CORONARY STENT INTERVENTION (PCI-S);  Surgeon: Peter M Martinique, MD;  Location: [facility] CATH LAB;  Service: Cardiovascular;  Laterality: N/A;   RIGHT/LEFT HEART CATH AND CORONARY/GRAFT ANGIOGRAPHY N/A 06/04/2020   Procedure: RIGHT/LEFT HEART CATH AND CORONARY/GRAFT ANGIOGRAPHY;  Surgeon: Martinique, Peter M, MD;  Location: [facility] [place] [facility];  Service: Cardiovascular;  Laterality: N/A;   TEMPORARY PACEMAKER N/A 06/04/2020   Procedure: TEMPORARY PACEMAKER;  Surgeon: Martinique, Peter M, MD;  Location: [place] [facility];  Service: Cardiovascular;  Laterality: N/A;    Allergies  No Known Allergies  History of Present Illness    [name] is a 66 year old male patient we are following for ongoing assessment and management of acute on chronic systolic CHF, end-stage EF 20 to 25%, with biventricular failure, ischemic cardiomyopathy, coronary artery disease status post CABG in 2014 (DES to the SVG to RCA anastomosis, POBA to the SVG to PDA, thrombotic occlusion to the SVG to OM1).,  CVA.  Recently admitted for acute on chronic systolic CHF and volume overload 08/2021.  He was discharged on 08/21/2021, he developed hypotension and worsening renal function with attempts to diurese, and was started on dobutamine infusion.  He was taken off Entresto and spironolactone, and beta-blocker, and has remained on torsemide 40 mg daily and midodrine 15 mg 3 times daily for hypotension.  It was  documented that he was not interested in advanced therapies but agreed to home DBA infusion.  Home health was requested to maintain IV infusion ([facility]) wheelchair male.  He is now on palliative care with IV nursing services.  Discharge weight 78.9 kg.  He comes today with fatigue, he complains of coughing when he lays down, he denies any weight gain, worsening edema, continues to have NYHA class III-IV symptoms.  Remains on dobutamine infusion.  He denies palpitations or heart racing.  His main complaint is insomnia and nausea with vomiting,  He also admits to having some bright red blood after bowel movements possibly up to twice a week.  He denies constipation.  He does have early satiety.  Home Medications    Current Outpatient Medications  Medication Sig Dispense Refill   amiodarone (PACERONE) 200 MG tablet TAKE 1 TABLET BY MOUTH 2 TIMES DAILY. 60 tablet 0   apixaban (ELIQUIS) 5 MG TABS tablet Take 1 tablet (5 mg total) by mouth 2 (two) times daily. 180 tablet 1   atorvastatin (LIPITOR) 80 MG tablet TAKE 1 TABLET BY MOUTH DAILY AT 6:00 PM 90 tablet 3   clopidogrel (PLAVIX) 75 MG tablet Take 1 tablet (75 mg total) by mouth daily. 90 tablet 3   DOBUTamine (DOBUTREX) 4-5 MG/ML-% infusion Inject 193.5 mcg/min into the vein continuous. Per [facility] infusion 250 mL  12   escitalopram (LEXAPRO) 10 MG tablet Take 1 tablet (10 mg total) by mouth daily. 30 tablet 11   ezetimibe (ZETIA) 10 MG tablet Take 10 mg by mouth daily.     Multiple Vitamins-Minerals (ONE-A-DAY MENS 50+ ADVANTAGE) TABS Take 1 tablet by mouth daily with breakfast.     nitroGLYCERIN (NITROSTAT) 0.4 MG SL tablet Place 1 tablet (0.4 mg total) under the tongue every 5 (five) minutes x 3 doses as needed for chest pain. 25 tablet 2   nystatin (MYCOSTATIN/NYSTOP) powder APPLY TOPICALLY TO THE AFFECTED AREA 2 TIMES A DAY FOR 2 WEEKS (Patient taking differently: 1 application 2 (two) times daily as needed (rash).) 30 g 0   Potassium Chloride  ER 20 MEQ TBCR Take 20 mEq by mouth daily. (Patient taking differently: Take 40 mEq by mouth daily.) 30 tablet 6   torsemide (DEMADEX) 20 MG tablet Take 3 tablets (60 mg total) by mouth daily. (Patient taking differently: Take 40 mg by mouth daily.) 90 tablet 4   No current facility-administered medications for this visit.     Family History    Family History  Problem Relation Age of Onset   Cancer Father    Hypertension Father    Hyperlipidemia Father    Heart attack Father    Hypertension Sister    Hypertension Brother    Hyperlipidemia Brother    Heart attack Sister    Heart attack Brother    He indicated that his mother is deceased. He indicated that his father is deceased. He indicated that only one of his three sisters is alive. He indicated that only one of his four brothers is alive. He indicated that his maternal grandmother is deceased. He indicated that his maternal grandfather is deceased. He indicated that his paternal grandmother is deceased. He indicated that his paternal grandfather is deceased.  Social History    Social History   Socioeconomic History   Marital status: Married    Spouse name: [name]   Number of children: 2   Years of education: Not on file   Highest education level: 11th grade  Occupational History   Occupation: retired    Comment: large Database administrator  Tobacco Use   Smoking status: Former    Types: Pipe   Smokeless tobacco: Never  Scientific laboratory technician Use: Never used  Substance and Sexual Activity   Alcohol use: No   Drug use: No   Sexual activity: Not Currently  Other Topics Concern   Not on file  Social History Narrative   Not on file   Social Determinants of Health   Financial Resource Strain: Medium Risk   Difficulty of Paying Living Expenses: Somewhat hard  Food Insecurity: No Food Insecurity   Worried About Charity fundraiser in the Last Year: Never true   Arboriculturist in the Last Year: Never true  Transportation  Needs: No Transportation Needs   Lack of Transportation (Medical): No   Lack of Transportation (Non-Medical): No  Physical Activity: Not on file  Stress: Not on file  Social Connections: Not on file  Intimate Partner Violence: Not on file     Review of Systems    General:  No chills, fever, night sweats or weight changes.  Cardiovascular:  No chest pain, +dyspnea on exertion, denies edema, orthopnea, palpitations, paroxysmal nocturnal dyspnea. Dermatological: No rash, lesions/masses Respiratory: No cough, dyspnea Urologic: No hematuria, dysuria Abdominal:  Positive nausea, vomiting, diarrhea, + bright red blood per  rectum, melena, or hematemesis Neurologic:  No visual changes, wkns, changes in mental status. All other systems reviewed and are otherwise negative except as noted above.     Physical Exam    VS:  BP 108/80 (BP Location: Left Arm, Patient Position: Sitting, Cuff Size: Normal)    Pulse 75    Ht 5\' 7"  (1.702 m)    Wt 180 lb (81.6 kg)    BMI 28.19 kg/m  , BMI Body mass index is 28.19 kg/m.     GEN: Well nourished, well developed, in no acute distress, appears frail. HEENT: normal. Neck: Supple, no JVD, carotid bruits, or masses. Cardiac: RRR, 3/6 holosystolic murmur radiating into the abdomen rubs, or gallops. No clubbing, cyanosis, edema.  Radials/DP/PT diminished and equal bilaterally.  Respiratory:  Respirations regular and slightly labored, clear to auscultation bilaterally. GI: Soft, nontender, nondistended, BS + x 4. MS: no deformity or atrophy.  Ecchymosis at right subclavian IV site for dobutamine infusion which is securely connected Skin: warm and dry, no rash, pale. Neuro:  Strength is diminished and sensation are intact. Psych: Normal affect.  Accessory Clinical Findings    ECG personally reviewed by me today-sinus rhythm with first-degree AV block, with PACs, 75 bpm- No acute changes  Lab Results  Component Value Date   WBC 11.3 (H) 08/27/2021   HGB  13.7 08/27/2021   HCT 44.2 08/27/2021   MCV 81.1 08/27/2021   PLT 309 08/27/2021   Lab Results  Component Value Date   CREATININE 2.09 (H) 08/27/2021   BUN 40 (H) 08/27/2021   NA 134 (L) 08/27/2021   K 5.4 (H) 08/27/2021   CL 100 08/27/2021   CO2 24 08/27/2021   Lab Results  Component Value Date   ALT 34 04/02/2021   AST 131 (H) 04/02/2021   ALKPHOS 56 04/02/2021   BILITOT 1.3 (H) 04/02/2021   Lab Results  Component Value Date   CHOL 168 03/31/2021   HDL 30 (L) 03/31/2021   LDLCALC 61 03/31/2021   LDLDIRECT 94.8 03/23/2019   TRIG 385 (H) 03/31/2021   CHOLHDL 5.6 03/31/2021    Lab Results  Component Value Date   HGBA1C 5.7 (H) 03/31/2021    Review of Prior Studies:  Echocardiogram: 08/10/2021   1. Poor acoustical windows limits accurate assessment of EF and wall  motion. Left ventricular endocardial border not optimally defined to  evaluate regional wall motion. There is moderate concentric left  ventricular hypertrophy. Left ventricular  diastolic function could not be evaluated.   2. Right ventricular systolic function is moderately reduced. The right  ventricular size is normal. Tricuspid regurgitation signal is inadequate  for assessing PA pressure.   3. Left atrial size was severely dilated.   4. Right atrial size was severely dilated.   5. The mitral valve is normal in structure. Mild mitral valve  regurgitation. No evidence of mitral stenosis.   6. The aortic valve is tricuspid. Aortic valve regurgitation is trivial.  Aortic valve sclerosis/calcification is present, without any evidence of  aortic stenosis. Aortic valve area, by VTI measures 4.04 cm. Aortic valve  mean gradient measures 1.0 mmHg.   Aortic valve Vmax measures 0.81 m/s.   7. The inferior vena cava is dilated in size with <50% respiratory  variability, suggesting right atrial pressure of 15 mmHg.   8. Recommend repeat limited study with definity    Left and Right Heart Cath  06/04/2020 Mid LAD lesion is 100% stenosed. Prox Cx lesion is 100% stenosed.  Prox RCA lesion is 100% stenosed. Dist RCA lesion is 20% stenosed. Non-stenotic Origin to Prox Graft lesion was previously treated. Prox Graft lesion is 30% stenosed. LV end diastolic pressure is mildly elevated.   1. Severe 3 vessel occlusive CAD. 2. Patent LIMA to the LAD 3. Patent SVG to OM1 4. Patent SVG to RCA. Much improved flow from prior PCI. 5. Mildly elevated LVEDP 17 mm Hg 6. Successful placement of temporary transvenous pacemaker.   Assessment & Plan   1.  Chronic systolic CHF: He is at end-stage with NYHA class III-IV symptoms.  O2 sat was 97% at rest.  Currently he is in not in need of oxygen supplementation.  He complains of a cough when laying flat.  I have advised him if cough continues to be worse he can take an extra dose of torsemide 20 mg to see if this is helpful.  I do not see any overt signs of volume overload at this time.  Weight is stable from discharge.  Can possibly consider adding Tessalon Perles for as needed use.  Continue dobutamine infusion management per Dr. Haroldine Laws and [facility] services.  He will have BNP, BMET, CBC in two weeks. Rx are refilled.   2. CAD: Severe multivessel.  No further interventions.  We will continue on clopidogrel.  Concern for some bright red blood with bowel movement.  We will get a CBC.  3.  History of PE: Has been on Eliquis 5 mg twice daily.  May need to consider stopping that, CBC is ordered.  4.  History of ventricular tachycardia: Secondary to significantly reduced EF.  Remains on amiodarone 200 mg twice daily.  Defer dosing to Dr. Quentin Ore on follow-up for ICD check.  5.  ICD in situ: Followed by Dr. Quentin Ore, he is to see him on September 17, 2021.  6.  Nausea and vomiting: May be related to dobutamine infusion along with decrease gastric perfusion from reduced EF.  I am prescribing him p.o. Zofran 4 mg as needed nausea and vomiting.  I have  explained to him that it may cause a little bit of sedation.  He states he is not sleeping well so that might be helpful.   7.  Hyperlipidemia: Remains on atorvastatin 80 mg daily.  Labs are ordered.  8.  Palliative care services: Patient is now under palliative care services with home health nurses drawing blood, managing dobutamine infusion, providing care and comfort care for symptom management.  Defer to them concerning any assistive devices that he may need.  I have explained to the patient that they will also need to ask for things that they feel like they would be requiring.  They verbalized understanding.   Current medicines are reviewed at length with the patient today.  I have spent 45 min's  dedicated to the care of this patient on the date of this encounter to include pre-visit review of records, assessment, management and diagnostic testing,with shared decision making.   Signed, Phill Myron. West Pugh, ANP, AACC    09/07/2021 11:18 AM    [place] [address] Office [phone] Fax [phone]  Notice: This dictation was prepared with Dragon dictation along with smaller phrase technology. Any transcriptional errors that result from this process are unintentional and may not be corrected upon review.

## 2021-09-07 ENCOUNTER — Encounter: Payer: Self-pay | Admitting: Adult Health

## 2021-09-07 ENCOUNTER — Other Ambulatory Visit: Payer: Self-pay

## 2021-09-07 ENCOUNTER — Ambulatory Visit (INDEPENDENT_AMBULATORY_CARE_PROVIDER_SITE_OTHER): Payer: Medicare PPO | Admitting: Adult Health

## 2021-09-07 VITALS — BP 108/80 | HR 75 | Ht 67.0 in | Wt 180.0 lb

## 2021-09-07 DIAGNOSIS — R112 Nausea with vomiting, unspecified: Secondary | ICD-10-CM

## 2021-09-07 DIAGNOSIS — I5043 Acute on chronic combined systolic (congestive) and diastolic (congestive) heart failure: Secondary | ICD-10-CM | POA: Diagnosis not present

## 2021-09-07 DIAGNOSIS — I2510 Atherosclerotic heart disease of native coronary artery without angina pectoris: Secondary | ICD-10-CM | POA: Diagnosis not present

## 2021-09-07 DIAGNOSIS — E785 Hyperlipidemia, unspecified: Secondary | ICD-10-CM | POA: Diagnosis not present

## 2021-09-07 DIAGNOSIS — Z515 Encounter for palliative care: Secondary | ICD-10-CM

## 2021-09-07 DIAGNOSIS — N183 Chronic kidney disease, stage 3 unspecified: Secondary | ICD-10-CM

## 2021-09-07 DIAGNOSIS — I5023 Acute on chronic systolic (congestive) heart failure: Secondary | ICD-10-CM

## 2021-09-07 DIAGNOSIS — I5082 Biventricular heart failure: Secondary | ICD-10-CM

## 2021-09-07 DIAGNOSIS — I4729 Other ventricular tachycardia: Secondary | ICD-10-CM

## 2021-09-07 MED ORDER — EZETIMIBE 10 MG PO TABS: 10.0000 mg | ORAL_TABLET | Freq: Every day | ORAL | 3 refills | Status: AC

## 2021-09-07 MED ORDER — ATORVASTATIN CALCIUM 80 MG PO TABS: ORAL_TABLET | ORAL | 3 refills | Status: AC

## 2021-09-07 MED ORDER — APIXABAN 5 MG PO TABS: 5.0000 mg | ORAL_TABLET | Freq: Two times a day (BID) | ORAL | 0 refills | Status: DC

## 2021-09-07 MED ORDER — ONDANSETRON HCL 4 MG PO TABS: 4.0000 mg | ORAL_TABLET | Freq: Three times a day (TID) | ORAL | 0 refills | Status: AC | PRN

## 2021-09-07 MED ORDER — CLOPIDOGREL BISULFATE 75 MG PO TABS: 75.0000 mg | ORAL_TABLET | Freq: Every day | ORAL | 3 refills | Status: AC

## 2021-09-07 MED ORDER — TORSEMIDE 40 MG PO TABS: 20.0000 mg | ORAL_TABLET | Freq: Two times a day (BID) | ORAL | 3 refills | Status: DC

## 2021-09-07 MED ORDER — APIXABAN 5 MG PO TABS: 5.0000 mg | ORAL_TABLET | Freq: Two times a day (BID) | ORAL | 3 refills | Status: AC

## 2021-09-07 MED ORDER — AMIODARONE HCL 200 MG PO TABS: 200.0000 mg | ORAL_TABLET | Freq: Two times a day (BID) | ORAL | 3 refills | Status: AC

## 2021-09-07 MED ORDER — ESCITALOPRAM OXALATE 10 MG PO TABS: 10.0000 mg | ORAL_TABLET | Freq: Every day | ORAL | 11 refills | Status: AC

## 2021-09-07 NOTE — Patient Instructions (Signed)
Medication Instructions:  Start Zofran 4 mg ( Take 1 Tablet Every 8 hours As Needed). Torsemide 20 mg ( Take Extra 20 mg Tablet as Needed for Swelling). *If you need a refill on your cardiac medications before your next appointment, please call your pharmacy*   Lab Work: CBC,CMET,BNP ( To Be Done By [facility] in 2Weeks). If you have labs (blood work) drawn today and your tests are completely normal, you will receive your results only by: [place] (if you have MyChart) OR A paper copy in the mail If you have any lab test that is abnormal or we need to change your treatment, we will call you to review the results.   Testing/Procedures: No Testing   Follow-Up: At [facility], you and your health needs are our priority.  As part of our continuing mission to provide you with exceptional heart care, we have created designated Provider Care Teams.  These Care Teams include your primary Cardiologist (physician) and Advanced Practice Providers (APPs -  Physician Assistants and Nurse Practitioners) who all work together to provide you with the care you need, when you need it.  We recommend signing up for the patient portal called "MyChart".  Sign up information is provided on this After Visit Summary.  MyChart is used to connect with patients for Virtual Visits (Telemedicine).  Patients are able to view lab/test results, encounter notes, upcoming appointments, etc.  Non-urgent messages can be sent to your provider as well.   To learn more about what you can do with MyChart, go to [email or web address].    Your next appointment:   1 month(s)  The format for your next appointment:   In Person  Provider:   Jory Sims, FNP,DNP    :1}

## 2021-09-10 NOTE — Progress Notes (Signed)
Well, that came as a surprise. I had no idea he had taken such a turn for the worse. Thanks for the follow up

## 2021-09-14 ENCOUNTER — Telehealth (HOSPITAL_COMMUNITY): Payer: Self-pay

## 2021-09-14 NOTE — Progress Notes (Addendum)
ADVANCED HF CLINIC NOTE  Primary Care: Hamrick, Lorin Mercy, MD HF Cardiologist: Dr. Haroldine Laws  HPI: [name] is a 66 y.o. male with history of CAD s/p CABG in 2014 (LIMA to LAD, SVG to RCA, SVG to OM2) followed by inferior STEMI 09/20 s/p DES SVG to RCA anastomosis and STEMI 10/21 s/p POBA SVG to PDA, hx VT arrest (suspected scar mediated) s/p dual-chamber ICD, chronic systolic CHF/ICM, hx CVA, hx bilateral PE and b/l DVT, hx possible LV thrombus.   Admitted 11/21 with VT that converted to asystole. Required external pacing. Cardiac cath with stable CAD. EF 30-35%. Seen by AHF service at that time. Underwent dual chamber ICD during admit.    Admitted 09/22 with inferoposterior STEMI. Emergent cath showed thrombotic occlusion of SVG to OM1. SVG to RCA patent, unable to assess LIMA to LAD. He was treated with Aggrastat followed by aspirin, plavix and Eliquis. EF 20-25% on echo that admit.    He was admitted 1/23 with acute bi-ventricular failure. Attempted to diurese with IV lasix but developed hypotension. Entresto, spiro and metoprolol held. Continued on SGLT2i. Scr up from 1.45 >> 2.35 on admit, concern for cardiorenal/low output. Echo showed EF 20-25%, moderate RV dysfunction. AHF consulted. Started on DBA 2.5. DBA stopped and renal function and Co-Ox declined. Patient not interested in advanced therapies, home DBA arranged. PMT consulted and he was a DNR/DNI. Discharged home on midodrine 15 tid and torsemide 40 daily via tunneled PICC, weight 170 lbs.   Hospital follow up mildly volume up, torsemide increased to 60 daily and midodrine decreased to 5 mg tid, weight 177 lbs.  Today he returns for HF follow up with his wife. He tells me he feels fine and is able to get around the house OK without much SOB. He went to the grocery store yesterday and walked up and down the aisles without problem .His wife tells me she has noticed he is more weak and requires more assistance going to the  restroom. His IV pump has been beeping for the past couple of days, says "air in line." Experiencing early satiety and poor appetite. Right leg is swelling more. Denies abnormal bleeding, CP, dizziness, palpitations, or PND/Orthopnea. Appetite poor. No fever or chills. Weight at home 180 pounds. Wife helps with medications. He is a retired Administrator. Lives at home with his wife near [place]. Has a daughter.    Cardiac Studies: - Echo (11/21): EF 30-35%  - Echo (9/22): EF 20-25%   - Echo (1/23): 20-25%, moderate RV dysfunction, severe bi-atrial dilation, mild MR  Past Medical History:  Diagnosis Date   Acute pulmonary embolism without acute cor pulmonale (HCC)    PE-DVT during hospitalization Nov-Dec 2021 On Eliquis at discharge   Acute ST elevation myocardial infarction (STEMI) involving other coronary artery of inferior wall (HCC) 03/31/2021   Acute ST elevation myocardial infarction (STEMI) of inferior wall ([place]) 03/31/2021   Borderline diabetic    Cardiogenic shock (HCC)    Coronary artery disease    a. s/p CABG in 2014 with LIMA-LAD, SVG-OM2 and SVG-RC b. 03/2019: STEMI and s/p angioplasty to proximal SVG-RCA and DES to SVG-RCA anastomosis c. 04/2020: recurrent STEMI with angioplasty of SVG-RCA   Dyslipidemia 01/11/2013   Hiatal hernia    Hypertension    Mini stroke    2   Respiratory failure with hypoxia (HCC)    S/P CABG x 3    Sleep disturbance    ST elevation myocardial infarction (STEMI) ([place]) 09/01/2012  Pt presented 03/23/2019 with inferior STEMI complicated by respiratory failure- intubation   STEMI (ST elevation myocardial infarction) (HCC)    Stroke (HCC)    Transaminitis     Current Outpatient Medications  Medication Sig Dispense Refill   amiodarone (PACERONE) 200 MG tablet Take 1 tablet (200 mg total) by mouth 2 (two) times daily. 60 tablet 3   apixaban (ELIQUIS) 5 MG TABS tablet Take 1 tablet (5 mg total) by mouth 2 (two) times daily. 60 tablet 3   atorvastatin  (LIPITOR) 80 MG tablet TAKE 1 TABLET BY MOUTH DAILY AT 6:00 PM 90 tablet 3   clopidogrel (PLAVIX) 75 MG tablet Take 1 tablet (75 mg total) by mouth daily. 90 tablet 3   DOBUTamine (DOBUTREX) 4-5 MG/ML-% infusion Inject 193.5 mcg/min into the vein continuous. Per [facility] infusion 250 mL 12   escitalopram (LEXAPRO) 10 MG tablet Take 1 tablet (10 mg total) by mouth daily. 30 tablet 11   ezetimibe (ZETIA) 10 MG tablet Take 1 tablet (10 mg total) by mouth daily. 30 tablet 3   Multiple Vitamins-Minerals (ONE-A-DAY MENS 50+ ADVANTAGE) TABS Take 1 tablet by mouth daily with breakfast.     nitroGLYCERIN (NITROSTAT) 0.4 MG SL tablet Place 1 tablet (0.4 mg total) under the tongue every 5 (five) minutes x 3 doses as needed for chest pain. 25 tablet 2   nystatin (MYCOSTATIN/NYSTOP) powder APPLY TOPICALLY TO THE AFFECTED AREA 2 TIMES A DAY FOR 2 WEEKS (Patient taking differently: 1 application 2 (two) times daily as needed (rash).) 30 g 0   ondansetron (ZOFRAN) 4 MG tablet Take 1 tablet (4 mg total) by mouth every 8 (eight) hours as needed for nausea or vomiting. 30 tablet 0   torsemide 40 MG TABS Take 20 mg by mouth 2 (two) times daily. 60 tablet 3   No current facility-administered medications for this encounter.    No Known Allergies    Social History   Socioeconomic History   Marital status: Married    Spouse name: [name]   Number of children: 2   Years of education: Not on file   Highest education level: 11th grade  Occupational History   Occupation: retired    Comment: large Database administrator  Tobacco Use   Smoking status: Former    Types: Pipe   Smokeless tobacco: Never  Scientific laboratory technician Use: Never used  Substance and Sexual Activity   Alcohol use: No   Drug use: No   Sexual activity: Not Currently  Other Topics Concern   Not on file  Social History Narrative   Not on file   Social Determinants of Health   Financial Resource Strain: Medium Risk   Difficulty of Paying Living  Expenses: Somewhat hard  Food Insecurity: No Food Insecurity   Worried About Charity fundraiser in the Last Year: Never true   Arboriculturist in the Last Year: Never true  Transportation Needs: No Transportation Needs   Lack of Transportation (Medical): No   Lack of Transportation (Non-Medical): No  Physical Activity: Not on file  Stress: Not on file  Social Connections: Not on file  Intimate Partner Violence: Not on file   Family History  Problem Relation Age of Onset   Cancer Father    Hypertension Father    Hyperlipidemia Father    Heart attack Father    Hypertension Sister    Hypertension Brother    Hyperlipidemia Brother    Heart attack Sister  Heart attack Brother    BP (!) 86/68    Pulse 76    Wt 84.5 kg (186 lb 3.2 oz)    SpO2 97%    BMI 29.16 kg/m   Wt Readings from Last 3 Encounters:  09/17/21 84.5 kg (186 lb 3.2 oz)  09/07/21 81.6 kg (180 lb)  08/27/21 80.4 kg (177 lb 3.2 oz)   PHYSICAL EXAM: General:  NAD. No resp difficulty, fatigued appearing, pale HEENT: Normal Neck: Supple. JVP to ear. Carotids 2+ bilat; no bruits. No lymphadenopathy or thryomegaly appreciated. Cor: PMI nondisplaced. Regular rate & rhythm. No rubs, gallops or murmurs. Lungs: Diminished throughout, crackles RLL Abdomen: Obese, nontender, nondistended. No hepatosplenomegaly. No bruits or masses. Good bowel sounds. Extremities: No cyanosis, clubbing, rash, 2-3+ BLE edema Neuro: Alert & oriented x 3, cranial nerves grossly intact. Moves all 4 extremities w/o difficulty. Affect pleasant.  Device interrogation:  unable to interrogate device in clinic  ReDs: 48%  ASSESSMENT & PLAN: Acute on Chronic systolic CHF with biventricular failure/ICM: - Echo 05/04/20 EF 25%. Moderate RV to severe RV dysfunction and possible LV thrombus.  - [place] 10/21 with preserved output and well compensated filling pressures.  - Echo 11/21 EF 25-30%  - Echo 01/23: EF 20-25% Moderate RV dysfunction.  - Started  on DBA 2.5 during 1/23 admission, failed wean and continued palliative home inotrope.  - NYHA IIIb, functional status difficult due to general deconditioning. Volume up on exam, ReDs 48%, weight up 16 lbs from discharge. Suspect due to issues with IV DBA infusion (suspect infusion has been stopped for the past couple of days). I called HH RN, they will come out to the house today to resolve issue with pump. - Instructed patient and wife to call [facility] immediately with any further issues with IV pump. - Restart midodrine 5 mg tid. - Increase torsemide 80 mg bid and take metolazone 2.5 mg + extra 40 KCL x 3 days. - Off Entresto and spiro with AKI/hypotension. - SGLT2i stopped with AKI. - Off beta blocker with a/c CHF. - He has end-stage HF due to iCM.  - He is not interested in advanced therapies (VAD/transplant). - Labs today, repeat labs next week at follow up.   2. CAD: - s/p CABG 2014 - Inferior STEMI 09/20 s/p DES SVG to RCA anastomosis - Inferior STEMI 05/03/20 s/p POBA SVG->PDA - Inferoposterior STEMI 09/22 >> thrombotic occlusion SVG to OM1 - No s/s angina, - On plavix. No ASA d/t anticoagulation.  - Continue atorvastatin 80.   3. Hx VT arrest/frequent PVCs: - S/p dual chamber ICD placement. - Continue amiodarone 200 mg bid. - He needs follow up with EP.   4. Hx Bilateral PE/b/l DVT: - Anticoagulated with Eliquis. - CBC today.   5. Possible LV thrombus: - Noted on echo 10/21, TEE 11/21 with no thrombus.   6. CKD IIIb: - Baseline Scr previously 1.4>>3.12 on discharge. - On DBA 2.5 - Restart midodrine as above to help w/ renal perfusion and keep SBPs > 110. - Labs today.    7. GOC - DNR/DNI - We discussed hospice briefly today.  - He is not ready yet.   Difficult situation, discussed with Dr. Haroldine Laws. I talked with Ryken and his wife about admitting for IV diuresis and continued inotrope support, but no real end game with this. Patient has his sister's funeral tomorrow  and does not want to miss this. He is agreeable to escalation of oral diuretics. We discussed getting hospice on  board in the event he were to deteriorate further. He is not ready for this.  Follow up next week with APP to reassess volume. Keep follow up with Dr. Haroldine Laws in 6 weeks.  Allena Katz, FNP-BC 09/17/21

## 2021-09-14 NOTE — Telephone Encounter (Signed)
Called to confirm/remind patient of their appointment at the [facility] on 09/17/21.  ? ?Patient reminded to bring all medications and/or complete list. ? ?Confirmed patient has transportation. Gave directions, instructed to utilize [facility] parking. ? ?Confirmed appointment prior to ending call.  ? ?

## 2021-09-17 ENCOUNTER — Encounter (HOSPITAL_COMMUNITY): Payer: Self-pay

## 2021-09-17 ENCOUNTER — Other Ambulatory Visit (HOSPITAL_COMMUNITY): Payer: Self-pay | Admitting: Family Medicine

## 2021-09-17 ENCOUNTER — Ambulatory Visit (HOSPITAL_COMMUNITY)
Admission: RE | Admit: 2021-09-17 | Discharge: 2021-09-17 | Disposition: A | Discharge status: Home / Self Care | Payer: Medicare PPO | Source: Ambulatory Visit | Attending: Family Medicine | Admitting: Family Medicine

## 2021-09-17 ENCOUNTER — Other Ambulatory Visit: Payer: Self-pay

## 2021-09-17 VITALS — BP 86/68 | HR 76 | Wt 186.2 lb

## 2021-09-17 DIAGNOSIS — I4729 Other ventricular tachycardia: Secondary | ICD-10-CM

## 2021-09-17 DIAGNOSIS — Z8674 Personal history of sudden cardiac arrest: Secondary | ICD-10-CM | POA: Diagnosis not present

## 2021-09-17 DIAGNOSIS — I5023 Acute on chronic systolic (congestive) heart failure: Secondary | ICD-10-CM | POA: Insufficient documentation

## 2021-09-17 DIAGNOSIS — I493 Ventricular premature depolarization: Secondary | ICD-10-CM | POA: Diagnosis not present

## 2021-09-17 DIAGNOSIS — I959 Hypotension, unspecified: Secondary | ICD-10-CM | POA: Insufficient documentation

## 2021-09-17 DIAGNOSIS — I2119 ST elevation (STEMI) myocardial infarction involving other coronary artery of inferior wall: Secondary | ICD-10-CM | POA: Diagnosis not present

## 2021-09-17 DIAGNOSIS — Z79899 Other long term (current) drug therapy: Secondary | ICD-10-CM | POA: Diagnosis not present

## 2021-09-17 DIAGNOSIS — N1832 Chronic kidney disease, stage 3b: Secondary | ICD-10-CM

## 2021-09-17 DIAGNOSIS — I513 Intracardiac thrombosis, not elsewhere classified: Secondary | ICD-10-CM

## 2021-09-17 DIAGNOSIS — I252 Old myocardial infarction: Secondary | ICD-10-CM | POA: Insufficient documentation

## 2021-09-17 DIAGNOSIS — Z95810 Presence of automatic (implantable) cardiac defibrillator: Secondary | ICD-10-CM | POA: Insufficient documentation

## 2021-09-17 DIAGNOSIS — Z8673 Personal history of transient ischemic attack (TIA), and cerebral infarction without residual deficits: Secondary | ICD-10-CM | POA: Insufficient documentation

## 2021-09-17 DIAGNOSIS — I25810 Atherosclerosis of coronary artery bypass graft(s) without angina pectoris: Secondary | ICD-10-CM | POA: Diagnosis not present

## 2021-09-17 DIAGNOSIS — Z8774 Personal history of (corrected) congenital malformations of heart and circulatory system: Secondary | ICD-10-CM | POA: Diagnosis not present

## 2021-09-17 DIAGNOSIS — I255 Ischemic cardiomyopathy: Secondary | ICD-10-CM | POA: Insufficient documentation

## 2021-09-17 DIAGNOSIS — Z7901 Long term (current) use of anticoagulants: Secondary | ICD-10-CM | POA: Diagnosis not present

## 2021-09-17 DIAGNOSIS — Z7902 Long term (current) use of antithrombotics/antiplatelets: Secondary | ICD-10-CM | POA: Insufficient documentation

## 2021-09-17 DIAGNOSIS — I2510 Atherosclerotic heart disease of native coronary artery without angina pectoris: Secondary | ICD-10-CM | POA: Insufficient documentation

## 2021-09-17 DIAGNOSIS — Z86711 Personal history of pulmonary embolism: Secondary | ICD-10-CM | POA: Diagnosis not present

## 2021-09-17 DIAGNOSIS — I5082 Biventricular heart failure: Secondary | ICD-10-CM | POA: Diagnosis not present

## 2021-09-17 DIAGNOSIS — E1122 Type 2 diabetes mellitus with diabetic chronic kidney disease: Secondary | ICD-10-CM | POA: Insufficient documentation

## 2021-09-17 DIAGNOSIS — Z66 Do not resuscitate: Secondary | ICD-10-CM | POA: Insufficient documentation

## 2021-09-17 DIAGNOSIS — Z8679 Personal history of other diseases of the circulatory system: Secondary | ICD-10-CM

## 2021-09-17 DIAGNOSIS — Z86718 Personal history of other venous thrombosis and embolism: Secondary | ICD-10-CM | POA: Diagnosis not present

## 2021-09-17 DIAGNOSIS — N179 Acute kidney failure, unspecified: Secondary | ICD-10-CM | POA: Diagnosis not present

## 2021-09-17 DIAGNOSIS — I5084 End stage heart failure: Secondary | ICD-10-CM | POA: Insufficient documentation

## 2021-09-17 DIAGNOSIS — I5042 Chronic combined systolic (congestive) and diastolic (congestive) heart failure: Secondary | ICD-10-CM | POA: Diagnosis not present

## 2021-09-17 DIAGNOSIS — N183 Chronic kidney disease, stage 3 unspecified: Secondary | ICD-10-CM

## 2021-09-17 DIAGNOSIS — Z951 Presence of aortocoronary bypass graft: Secondary | ICD-10-CM | POA: Insufficient documentation

## 2021-09-17 DIAGNOSIS — I130 Hypertensive heart and chronic kidney disease with heart failure and stage 1 through stage 4 chronic kidney disease, or unspecified chronic kidney disease: Secondary | ICD-10-CM | POA: Diagnosis not present

## 2021-09-17 DIAGNOSIS — Z955 Presence of coronary angioplasty implant and graft: Secondary | ICD-10-CM | POA: Insufficient documentation

## 2021-09-17 DIAGNOSIS — Z7189 Other specified counseling: Secondary | ICD-10-CM

## 2021-09-17 LAB — BASIC METABOLIC PANEL
Anion gap: 9 (ref 5–15)
BUN: 48 mg/dL — ABNORMAL HIGH (ref 8–23)
CO2: 30 mmol/L (ref 22–32)
Calcium: 9 mg/dL (ref 8.9–10.3)
Chloride: 95 mmol/L — ABNORMAL LOW (ref 98–111)
Creatinine, Ser: 2.6 mg/dL — ABNORMAL HIGH (ref 0.61–1.24)
GFR, Estimated: 26 mL/min — ABNORMAL LOW (ref >60)
Glucose, Bld: 108 mg/dL — ABNORMAL HIGH (ref 70–99)
Potassium: 4.8 mmol/L (ref 3.5–5.1)
Sodium: 134 mmol/L — ABNORMAL LOW (ref 135–145)

## 2021-09-17 LAB — BRAIN NATRIURETIC PEPTIDE: B Natriuretic Peptide: 1508.2 pg/mL — ABNORMAL HIGH (ref 0.0–100.0)

## 2021-09-17 MED ORDER — MIDODRINE HCL 5 MG PO TABS: 5.0000 mg | ORAL_TABLET | Freq: Three times a day (TID) | ORAL | 4 refills | Status: AC

## 2021-09-17 MED ORDER — POTASSIUM CHLORIDE CRYS ER 20 MEQ PO TBCR: 40.0000 meq | EXTENDED_RELEASE_TABLET | ORAL | 0 refills | Status: DC

## 2021-09-17 MED ORDER — METOLAZONE 5 MG PO TABS: 5.0000 mg | ORAL_TABLET | Freq: Every day | ORAL | 0 refills | Status: DC

## 2021-09-17 MED ORDER — TORSEMIDE 40 MG PO TABS: 80.0000 mg | ORAL_TABLET | Freq: Two times a day (BID) | ORAL | 4 refills | Status: DC

## 2021-09-17 NOTE — Patient Instructions (Addendum)
Thank you for coming in today ? ?Labs were done today, if any labs are abnormal the clinic will call you ? ?RESTART Midodrine 5 mg 1 tablet  three times a day ? ?INCREASE Torsemide to 80 mg 2 tablets, two times a day ? ?Metolazone 5 mg 1 tablet with 40 meq 2 tablets of Potassium for 3 days on  ?09/17/21 09/18/21 09/19/21 ? ?Your physician recommends that you schedule a follow-up appointment in:  ?NEXT WEEK IN CLINIC ? ?IF PUMP IS BEEPING OR ANY ISSUES WITH INFUSION PLEASE [place] [phone] ? ?At the [facility], you and your health needs are our priority. As part of our continuing mission to provide you with exceptional heart care, we have created designated Provider Care Teams. These Care Teams include your primary Cardiologist (physician) and Advanced Practice Providers (APPs- Physician Assistants and Nurse Practitioners) who all work together to provide you with the care you need, when you need it.  ? ?You may see any of the following providers on your designated Care Team at your next follow up: ?Dr Glori Bickers ?Dr Loralie Champagne ?Darrick Grinder, NP ?Lyda Jester, PA ?Jessica Milford,NP ?Marlyce Huge, PA ?Audry Riles, PharmD ? ? ?Please be sure to bring in all your medications bottles to every appointment.  ? ?If you have any questions or concerns before your next appointment please send [place] a message through [place] or call our office at [phone].   ? ?TO LEAVE A MESSAGE FOR THE NURSE SELECT OPTION 2, PLEASE LEAVE A MESSAGE INCLUDING: ?YOUR NAME ?DATE OF BIRTH ?CALL BACK NUMBER ?REASON FOR CALL**this is important as we prioritize the call backs ? ?YOU WILL RECEIVE A CALL BACK THE SAME DAY AS LONG AS YOU CALL BEFORE 4:00 PM ? ?

## 2021-09-17 NOTE — Progress Notes (Signed)
ReDS Vest / Clip - 09/17/21 0900   ? ?  ? ReDS Vest / Clip  ? Station Marker C   ? Ruler Value 29.5   ? ReDS Value Range High volume overload   ? ReDS Actual Value 48   ? ?  ?  ? ?  ? ? ?

## 2021-09-21 ENCOUNTER — Telehealth (HOSPITAL_COMMUNITY): Payer: Self-pay

## 2021-09-21 NOTE — Progress Notes (Signed)
ADVANCED HF CLINIC NOTE  Primary Care: Hamrick, Lorin Mercy, MD HF Cardiologist: Dr. Haroldine Laws  HPI: [name] is a 66 y.o. male with history of CAD s/p CABG in 2014 (LIMA to LAD, SVG to RCA, SVG to OM2) followed by inferior STEMI 09/20 s/p DES SVG to RCA anastomosis and STEMI 10/21 s/p POBA SVG to PDA, hx VT arrest (suspected scar mediated) s/p dual-chamber ICD, chronic systolic CHF/ICM, hx CVA, hx bilateral PE and b/l DVT, hx possible LV thrombus.   Admitted 11/21 with VT that converted to asystole. Required external pacing. Cardiac cath with stable CAD. EF 30-35%. Seen by AHF service at that time. Underwent dual chamber ICD during admit.    Admitted 09/22 with inferoposterior STEMI. Emergent cath showed thrombotic occlusion of SVG to OM1. SVG to RCA patent, unable to assess LIMA to LAD. He was treated with Aggrastat followed by aspirin, plavix and Eliquis. EF 20-25% on echo that admit.    He was admitted 1/23 with acute bi-ventricular failure. Attempted to diurese with IV lasix but developed hypotension. Entresto, spiro and metoprolol held. Continued on SGLT2i. Scr up from 1.45 >> 2.35 on admit, concern for cardiorenal/low output. Echo showed EF 20-25%, moderate RV dysfunction. AHF consulted. Started on DBA 2.5. DBA stopped and renal function and Co-Ox declined. Patient not interested in advanced therapies, home DBA arranged. PMT consulted and he was a DNR/DNI. Discharged home on midodrine 15 tid and torsemide 40 daily via tunneled PICC, weight 170 lbs.  Follow up 3/23 he was markedly volume up. ReDs 48%, weight 186 lbs. DBA pump had been malfunctioning x several days, and he was also off midodrine. Torsemide increased to 80 bid, midodrine restarted and instructed to take metolazone 5 mg x 3 days with extra 40 KCL. Discussed eventual need for hospice.  Today he returns for HF follow up with his wife. Overall feeling fine. He tells me he is not SOB with ADLs, just takes his time. Wife says he  remains weak and is sleeping a lot during the day. Denies CP, dizziness, edema, or PND/Orthopnea. Appetite poor, experiencing early satiety. No fever or chills. Weight at home 168 pounds. Taking all medications. Has had some BRBPR x 2 months. Does not have hemmorrhoids but did strain to have a BM. 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 Has a daughter.    Cardiac Studies: - Echo (11/21): EF 30-35%  - Echo (9/22): EF 20-25%   - Echo (1/23): 20-25%, moderate RV dysfunction, severe bi-atrial dilation, mild MR  Past Medical History:  Diagnosis Date   Acute pulmonary embolism without acute cor pulmonale (HCC)    PE-DVT during hospitalization Nov-Dec 2021 On Eliquis at discharge   Acute ST elevation myocardial infarction (STEMI) involving other coronary artery of inferior wall (HCC) 03/31/2021   Acute ST elevation myocardial infarction (STEMI) of inferior wall ([place]) 03/31/2021   Borderline diabetic    Cardiogenic shock (HCC)    Coronary artery disease    a. s/p CABG in 2014 with LIMA-LAD, SVG-OM2 and SVG-RC b. 03/2019: STEMI and s/p angioplasty to proximal SVG-RCA and DES to SVG-RCA anastomosis c. 04/2020: recurrent STEMI with angioplasty of SVG-RCA   Dyslipidemia 01/11/2013   Hiatal hernia    Hypertension    Mini stroke    2   Respiratory failure with hypoxia (HCC)    S/P CABG x 3    Sleep disturbance    ST elevation myocardial infarction (STEMI) ([place]) 09/01/2012  Pt presented 03/23/2019 with inferior STEMI complicated by respiratory failure- intubation   STEMI (ST elevation myocardial infarction) (HCC)    Stroke (HCC)    Transaminitis     Current Outpatient Medications  Medication Sig Dispense Refill   amiodarone (PACERONE) 200 MG tablet Take 1 tablet (200 mg total) by mouth 2 (two) times daily. 60 tablet 3   apixaban (ELIQUIS) 5 MG TABS tablet Take 1 tablet (5 mg total) by mouth 2 (two) times daily. 60 tablet 3   atorvastatin (LIPITOR) 80 MG tablet TAKE 1  TABLET BY MOUTH DAILY AT 6:00 PM 90 tablet 3   clopidogrel (PLAVIX) 75 MG tablet Take 1 tablet (75 mg total) by mouth daily. 90 tablet 3   DOBUTamine (DOBUTREX) 4-5 MG/ML-% infusion Inject 193.5 mcg/min into the vein continuous. Per [facility] infusion 250 mL 12   escitalopram (LEXAPRO) 10 MG tablet Take 1 tablet (10 mg total) by mouth daily. 30 tablet 11   ezetimibe (ZETIA) 10 MG tablet Take 1 tablet (10 mg total) by mouth daily. 30 tablet 3   metolazone (ZAROXOLYN) 5 MG tablet Take 1 tablet (5 mg total) by mouth daily. 3 tablet 0   midodrine (PROAMATINE) 5 MG tablet Take 1 tablet (5 mg total) by mouth 3 (three) times daily with meals. 90 tablet 4   Multiple Vitamins-Minerals (ONE-A-DAY MENS 50+ ADVANTAGE) TABS Take 1 tablet by mouth daily with breakfast.     nitroGLYCERIN (NITROSTAT) 0.4 MG SL tablet Place 1 tablet (0.4 mg total) under the tongue every 5 (five) minutes x 3 doses as needed for chest pain. 25 tablet 2   nystatin (MYCOSTATIN/NYSTOP) powder APPLY TOPICALLY TO THE AFFECTED AREA 2 TIMES A DAY FOR 2 WEEKS (Patient taking differently: 1 application. 2 (two) times daily as needed (rash).) 30 g 0   ondansetron (ZOFRAN) 4 MG tablet Take 1 tablet (4 mg total) by mouth every 8 (eight) hours as needed for nausea or vomiting. 30 tablet 0   potassium chloride SA (KLOR-CON M) 20 MEQ tablet Take 2 tablets (40 mEq total) by mouth as directed. Take 40 meq of potassium with each Metolazone dose 30 tablet 0   Torsemide 40 MG TABS Take 80 mg by mouth 2 (two) times daily. 120 tablet 4   No current facility-administered medications for this encounter.    No Known Allergies    Social History   Socioeconomic History   Marital status: Married    Spouse name: [name]   Number of children: 2   Years of education: Not on file   Highest education level: 11th grade  Occupational History   Occupation: retired    Comment: large Database administrator  Tobacco Use   Smoking status: Former    Types: Pipe    Smokeless tobacco: Never  Scientific laboratory technician Use: Never used  Substance and Sexual Activity   Alcohol use: No   Drug use: No   Sexual activity: Not Currently  Other Topics Concern   Not on file  Social History Narrative   Not on file   Social Determinants of Health   Financial Resource Strain: Medium Risk   Difficulty of Paying Living Expenses: Somewhat hard  Food Insecurity: No Food Insecurity   Worried About Charity fundraiser in the Last Year: Never true   Arboriculturist in the Last Year: Never true  Transportation Needs: No Transportation Needs   Lack of Transportation (Medical): No   Lack of Transportation (Non-Medical): No  Physical  Activity: Not on file  Stress: Not on file  Social Connections: Not on file  Intimate Partner Violence: Not on file   Family History  Problem Relation Age of Onset   Cancer Father    Hypertension Father    Hyperlipidemia Father    Heart attack Father    Hypertension Sister    Hypertension Brother    Hyperlipidemia Brother    Heart attack Sister    Heart attack Brother    BP (!) 120/98    Pulse 66    Wt 77 kg (169 lb 12.8 oz)    SpO2 97%    BMI 26.59 kg/m   Wt Readings from Last 3 Encounters:  09/24/21 77 kg (169 lb 12.8 oz)  09/17/21 84.5 kg (186 lb 3.2 oz)  09/07/21 81.6 kg (180 lb)   PHYSICAL EXAM: General:  NAD. No resp difficulty, weak and fatigued-appearing. HEENT: Normal Neck: Supple. No JVD. Carotids 2+ bilat; no bruits. No lymphadenopathy or thryomegaly appreciated. Cor: PMI nondisplaced. Regular rate & rhythm. No rubs, gallops or murmurs. Lungs: Clear, R PICC looks ok. Abdomen: Soft, nontender, nondistended. No hepatosplenomegaly. No bruits or masses. Good bowel sounds. Extremities: No cyanosis, clubbing, rash, edema, warm extremities Neuro: Alert & oriented x 3, cranial nerves grossly intact. Moves all 4 extremities w/o difficulty. Affect pleasant.  Device interrogation:  unable to interrogate device in  clinic  ReDs: 48%-->28%  ASSESSMENT & PLAN: 1. Chronic systolic CHF with biventricular failure/ICM: - Echo 05/04/20 EF 25%. Moderate RV to severe RV dysfunction and possible LV thrombus.  - [place] 10/21 with preserved output and well compensated filling pressures.  - Echo 11/21 EF 25-30%  - Echo 01/23: EF 20-25% Moderate RV dysfunction.  - Started on DBA 2.5 during 1/23 admission, failed wean and continued palliative home inotrope.  - NYHA III-IIIb, functional status difficult due to general deconditioning. Volume looks good today, weight down 17 lbs, ReDs down from 48%--->28% today. - Continue midodrine 5 mg tid. - Continue torsemide 80 mg bid. - Off Entresto and spiro with AKI/hypotension. - SGLT2i stopped with AKI. - Off beta blocker with a/c CHF. - He has end-stage HF due to iCM.  - He is not interested in advanced therapies (VAD/transplant). - Labs today.   2. CAD: - s/p CABG 2014 - Inferior STEMI 09/20 s/p DES SVG to RCA anastomosis - Inferior STEMI 05/03/20 s/p POBA SVG->PDA - Inferoposterior STEMI 09/22 >> thrombotic occlusion SVG to OM1 - No s/s angina - On plavix. No ASA d/t anticoagulation.  - Continue atorvastatin 80.   3. Hx VT arrest/frequent PVCs: - S/p dual chamber ICD placement. - Continue amiodarone 200 mg bid. - He has EP follow up next month.   4. Hx Bilateral PE/b/l DVT: - Anticoagulated with Eliquis. - Recent CBC ok.   5. Possible LV thrombus: - Noted on echo 10/21, TEE 11/21 with no thrombus.   6. CKD IIIb: - Baseline Scr previously 1.4>>3.12 on discharge. - On DBA 2.5 - Continue midodrine as above to help w/ renal perfusion and keep SBPs > 110. - Labs today.    7. GOC - DNR/DNI - We discussed hospice briefly at last visit.  - He is not ready yet.   8. BRBPR: - Suspect straining vs hemorrhoid. - Can use OTC stool softeners prn. - Would avoid invasive GI work up given end stage HF. - Check CBC today.   Follow up in 1 month with Dr.  Haroldine Laws as scheduled.  Allena Katz, FNP-BC 09/24/21

## 2021-09-21 NOTE — Telephone Encounter (Signed)
Called and left patient a message to confirm/remind patient of their appointment at the [facility] on 09/24/21.  ? ? ? ?

## 2021-09-24 ENCOUNTER — Ambulatory Visit (HOSPITAL_COMMUNITY)
Admission: RE | Admit: 2021-09-24 | Discharge: 2021-09-24 | Disposition: A | Discharge status: Home / Self Care | Payer: Medicare PPO | Source: Ambulatory Visit | Attending: Internal Medicine | Admitting: Internal Medicine

## 2021-09-24 ENCOUNTER — Encounter (HOSPITAL_COMMUNITY): Payer: Self-pay

## 2021-09-24 ENCOUNTER — Other Ambulatory Visit: Payer: Self-pay

## 2021-09-24 VITALS — BP 120/98 | HR 66 | Wt 169.8 lb

## 2021-09-24 DIAGNOSIS — R6881 Early satiety: Secondary | ICD-10-CM | POA: Insufficient documentation

## 2021-09-24 DIAGNOSIS — Z955 Presence of coronary angioplasty implant and graft: Secondary | ICD-10-CM | POA: Insufficient documentation

## 2021-09-24 DIAGNOSIS — I4729 Other ventricular tachycardia: Secondary | ICD-10-CM | POA: Diagnosis not present

## 2021-09-24 DIAGNOSIS — R531 Weakness: Secondary | ICD-10-CM | POA: Insufficient documentation

## 2021-09-24 DIAGNOSIS — K625 Hemorrhage of anus and rectum: Secondary | ICD-10-CM | POA: Diagnosis not present

## 2021-09-24 DIAGNOSIS — Z8774 Personal history of (corrected) congenital malformations of heart and circulatory system: Secondary | ICD-10-CM | POA: Diagnosis not present

## 2021-09-24 DIAGNOSIS — I5042 Chronic combined systolic (congestive) and diastolic (congestive) heart failure: Secondary | ICD-10-CM | POA: Insufficient documentation

## 2021-09-24 DIAGNOSIS — Z7901 Long term (current) use of anticoagulants: Secondary | ICD-10-CM | POA: Diagnosis not present

## 2021-09-24 DIAGNOSIS — Z8674 Personal history of sudden cardiac arrest: Secondary | ICD-10-CM | POA: Diagnosis not present

## 2021-09-24 DIAGNOSIS — I252 Old myocardial infarction: Secondary | ICD-10-CM | POA: Insufficient documentation

## 2021-09-24 DIAGNOSIS — Z79899 Other long term (current) drug therapy: Secondary | ICD-10-CM | POA: Insufficient documentation

## 2021-09-24 DIAGNOSIS — Z66 Do not resuscitate: Secondary | ICD-10-CM | POA: Diagnosis not present

## 2021-09-24 DIAGNOSIS — N183 Chronic kidney disease, stage 3 unspecified: Secondary | ICD-10-CM

## 2021-09-24 DIAGNOSIS — I493 Ventricular premature depolarization: Secondary | ICD-10-CM

## 2021-09-24 DIAGNOSIS — I2510 Atherosclerotic heart disease of native coronary artery without angina pectoris: Secondary | ICD-10-CM | POA: Diagnosis not present

## 2021-09-24 DIAGNOSIS — Z95810 Presence of automatic (implantable) cardiac defibrillator: Secondary | ICD-10-CM | POA: Diagnosis not present

## 2021-09-24 DIAGNOSIS — I5084 End stage heart failure: Secondary | ICD-10-CM | POA: Insufficient documentation

## 2021-09-24 DIAGNOSIS — Z86711 Personal history of pulmonary embolism: Secondary | ICD-10-CM

## 2021-09-24 DIAGNOSIS — N1832 Chronic kidney disease, stage 3b: Secondary | ICD-10-CM | POA: Insufficient documentation

## 2021-09-24 DIAGNOSIS — Z8673 Personal history of transient ischemic attack (TIA), and cerebral infarction without residual deficits: Secondary | ICD-10-CM | POA: Insufficient documentation

## 2021-09-24 DIAGNOSIS — Z86718 Personal history of other venous thrombosis and embolism: Secondary | ICD-10-CM | POA: Diagnosis not present

## 2021-09-24 DIAGNOSIS — Z7189 Other specified counseling: Secondary | ICD-10-CM

## 2021-09-24 DIAGNOSIS — I5082 Biventricular heart failure: Secondary | ICD-10-CM | POA: Diagnosis not present

## 2021-09-24 DIAGNOSIS — Z7902 Long term (current) use of antithrombotics/antiplatelets: Secondary | ICD-10-CM | POA: Diagnosis not present

## 2021-09-24 DIAGNOSIS — I513 Intracardiac thrombosis, not elsewhere classified: Secondary | ICD-10-CM

## 2021-09-24 DIAGNOSIS — I130 Hypertensive heart and chronic kidney disease with heart failure and stage 1 through stage 4 chronic kidney disease, or unspecified chronic kidney disease: Secondary | ICD-10-CM | POA: Diagnosis not present

## 2021-09-24 DIAGNOSIS — Z09 Encounter for follow-up examination after completed treatment for conditions other than malignant neoplasm: Secondary | ICD-10-CM | POA: Insufficient documentation

## 2021-09-24 DIAGNOSIS — Z951 Presence of aortocoronary bypass graft: Secondary | ICD-10-CM | POA: Diagnosis not present

## 2021-09-24 LAB — BASIC METABOLIC PANEL
Anion gap: 15 (ref 5–15)
BUN: 74 mg/dL — ABNORMAL HIGH (ref 8–23)
CO2: 40 mmol/L — ABNORMAL HIGH (ref 22–32)
Calcium: 9.7 mg/dL (ref 8.9–10.3)
Chloride: 77 mmol/L — ABNORMAL LOW (ref 98–111)
Creatinine, Ser: 2.73 mg/dL — ABNORMAL HIGH (ref 0.61–1.24)
GFR, Estimated: 25 mL/min — ABNORMAL LOW (ref >60)
Glucose, Bld: 145 mg/dL — ABNORMAL HIGH (ref 70–99)
Potassium: 3.1 mmol/L — ABNORMAL LOW (ref 3.5–5.1)
Sodium: 132 mmol/L — ABNORMAL LOW (ref 135–145)

## 2021-09-24 LAB — CBC
HCT: 45.9 % (ref 39.0–52.0)
Hemoglobin: 14.2 g/dL (ref 13.0–17.0)
MCH: 23.5 pg — ABNORMAL LOW (ref 26.0–34.0)
MCHC: 30.9 g/dL (ref 30.0–36.0)
MCV: 75.9 fL — ABNORMAL LOW (ref 80.0–100.0)
Platelets: 433 10*3/uL — ABNORMAL HIGH (ref 150–400)
RBC: 6.05 MIL/uL — ABNORMAL HIGH (ref 4.22–5.81)
RDW: 21.2 % — ABNORMAL HIGH (ref 11.5–15.5)
WBC: 11.8 10*3/uL — ABNORMAL HIGH (ref 4.0–10.5)
nRBC: 0 % (ref 0.0–0.2)

## 2021-09-24 LAB — BRAIN NATRIURETIC PEPTIDE: B Natriuretic Peptide: 382.1 pg/mL — ABNORMAL HIGH (ref 0.0–100.0)

## 2021-09-24 NOTE — Progress Notes (Signed)
ReDS Vest / Clip - 09/24/21 1100   ? ?  ? ReDS Vest / Clip  ? Station Marker C   ? Ruler Value 35   ? ReDS Value Range Low volume   ? ReDS Actual Value 28   ? ?  ?  ? ?  ? ? ?

## 2021-09-24 NOTE — Patient Instructions (Signed)
Thank you for coming in today ? ?Labs were done today, if any labs are abnormal the clinic will call you ?No news is good news ? ?Your physician recommends that you schedule a follow-up appointment in:  ?Keep follow up with Dr. Haroldine Laws ? ?At the [facility], you and your health needs are our priority. As part of our continuing mission to provide you with exceptional heart care, we have created designated Provider Care Teams. These Care Teams include your primary Cardiologist (physician) and Advanced Practice Providers (APPs- Physician Assistants and Nurse Practitioners) who all work together to provide you with the care you need, when you need it.  ? ?You may see any of the following providers on your designated Care Team at your next follow up: ?Dr Glori Bickers ?Dr Loralie Champagne ?Darrick Grinder, NP ?Lyda Jester, PA ?Jessica Milford,NP ?Marlyce Huge, PA ?Audry Riles, PharmD ? ? ?Please be sure to bring in all your medications bottles to every appointment.  ? ? ?If you have any questions or concerns before your next appointment please send [place] a message through [place] or call our office at [phone].   ? ?TO LEAVE A MESSAGE FOR THE NURSE SELECT OPTION 2, PLEASE LEAVE A MESSAGE INCLUDING: ?YOUR NAME ?DATE OF BIRTH ?CALL BACK NUMBER ?REASON FOR CALL**this is important as we prioritize the call backs ? ?YOU WILL RECEIVE A CALL BACK THE SAME DAY AS LONG AS YOU CALL BEFORE 4:00 PM ? ?

## 2021-09-26 ENCOUNTER — Other Ambulatory Visit: Payer: Self-pay | Admitting: Cardiology

## 2021-10-02 ENCOUNTER — Other Ambulatory Visit (HOSPITAL_COMMUNITY): Payer: Self-pay | Admitting: Family Medicine

## 2021-10-03 NOTE — Progress Notes (Signed)
?Cardiology Clinic Note  ? ?Patient Name: [name] ?Date of Encounter: 10/05/2021 ? ?Primary Care Provider:  [name], MD ?Primary Cardiologist:  Sanda Klein, MD ? ?Patient Profile  ?  ?66 year old male with severe coronary artery disease, status post CABG in 2014, (DES to the SVG to RCA anastomosis, POBA to the SVG to PDA, thrombotic occlusion to the SVG to OM1)., with multiple subsequent MI/PCI's; ischemic cardiomyopathy with HFrEF EF 20 to 25%, status post dual-chamber ICD pacemaker placement, history of bilateral PE/DVT on Eliquis, chronic kidney disease 3B, recent discharge on 08/21/2021 for acute on chronic systolic CHF with biventricular failure.  Followed by Dr. Haroldine Laws, [facility] in addition to general cardiology. ? ?He was discharged on 08/21/2021, he developed hypotension and worsening renal function with attempts to diurese, and was started on dobutamine infusion.  He was taken off Entresto and spironolactone, and beta-blocker, and has remained on torsemide 40 mg daily and midodrine 15 mg 3 times daily for hypotension.  It was documented that he was not interested in advanced therapies but agreed to home DBA infusion.  Home health was requested to maintain IV infusion ([place]) wheelchair male.  He is now on palliative care with IV nursing services.  Discharge weight 78.9 kg. ? ?Past Medical History  ?  ?Past Medical History:  ?Diagnosis Date  ? Acute pulmonary embolism without acute cor pulmonale (HCC)   ? PE-DVT during hospitalization Nov-Dec 2021 On Eliquis at discharge  ? Acute ST elevation myocardial infarction (STEMI) involving other coronary artery of inferior wall ([place]) 03/31/2021  ? Acute ST elevation myocardial infarction (STEMI) of inferior wall ([place]) 03/31/2021  ? Borderline diabetic   ? Cardiogenic shock ([place])   ? Coronary artery disease   ? a. s/p CABG in 2014 with LIMA-LAD, SVG-OM2 and SVG-RC b. 03/2019: STEMI and s/p angioplasty to proximal SVG-RCA and  DES to SVG-RCA anastomosis c. 04/2020: recurrent STEMI with angioplasty of SVG-RCA  ? Dyslipidemia 01/11/2013  ? Hiatal hernia   ? Hypertension   ? Mini stroke   ? 2  ? Respiratory failure with hypoxia ([place])   ? S/P CABG x 3   ? Sleep disturbance   ? ST elevation myocardial infarction (STEMI) ([place]) 09/01/2012  ? Pt presented 03/23/2019 with inferior STEMI complicated by respiratory failure- intubation  ? STEMI (ST elevation myocardial infarction) ([place])   ? Stroke [facility])   ? Transaminitis   ? ?Past Surgical History:  ?Procedure Laterality Date  ? CARDIAC CATHETERIZATION    ? CORONARY ARTERY BYPASS GRAFT N/A 09/01/2012  ? Procedure: CORONARY ARTERY BYPASS GRAFTING (CABG);  Surgeon: Gaye Pollack, MD;  Location: [place];  Service: Open Heart Surgery;  Laterality: N/A;  Coronary Artery Bypass Grafting Times Three Using Left Internal Mammary Artery and Left Saphenous leg Vein Harvested Endoscopically  ? CORONARY STENT INTERVENTION N/A 03/23/2019  ? Procedure: CORONARY STENT INTERVENTION;  Surgeon: Lorretta Harp, MD;  Location: [place] CV LAB;  Service: Cardiovascular;  Laterality: N/A;  ? CORONARY/GRAFT ACUTE MI REVASCULARIZATION N/A 03/23/2019  ? Procedure: Coronary/Graft Acute MI Revascularization;  Surgeon: Lorretta Harp, MD;  Location: [place] CV LAB;  Service: Cardiovascular;  Laterality: N/A;  ? CORONARY/GRAFT ACUTE MI REVASCULARIZATION N/A 05/03/2020  ? Procedure: Coronary/Graft Acute MI Revascularization;  Surgeon: Belva Crome, MD;  Location: [place] CV LAB;  Service: Cardiovascular;  Laterality: N/A;  ? IABP INSERTION N/A 06/04/2020  ? Procedure: IABP Insertion;  Surgeon: Martinique, Peter M, MD;  Location: [facility]  INVASIVE CV LAB;  Service: Cardiovascular;  Laterality: N/A;  ? ICD IMPLANT N/A 06/19/2020  ? Procedure: ICD IMPLANT;  Surgeon: Vickie Epley, MD;  Location: [place] CV LAB;  Service: Cardiovascular;  Laterality: N/A;  ? INTRAOPERATIVE TRANSESOPHAGEAL ECHOCARDIOGRAM N/A 09/01/2012  ?  Procedure: INTRAOPERATIVE TRANSESOPHAGEAL ECHOCARDIOGRAM;  Surgeon: Gaye Pollack, MD;  Location: [facility] OR;  Service: Open Heart Surgery;  Laterality: N/A;  ? IR FLUORO GUIDE CV LINE RIGHT  08/20/2021  ? IR US GUIDE VASC ACCESS RIGHT  08/20/2021  ? LEFT HEART CATH AND CORONARY ANGIOGRAPHY N/A 03/23/2019  ? Procedure: LEFT HEART CATH AND CORONARY ANGIOGRAPHY;  Surgeon: Lorretta Harp, MD;  Location: [place] CV LAB;  Service: Cardiovascular;  Laterality: N/A;  ? LEFT HEART CATH AND CORONARY ANGIOGRAPHY N/A 05/03/2020  ? Procedure: LEFT HEART CATH AND CORONARY ANGIOGRAPHY;  Surgeon: Belva Crome, MD;  Location: [place] CV LAB;  Service: Cardiovascular;  Laterality: N/A;  ? LEFT HEART CATH AND CORS/GRAFTS ANGIOGRAPHY N/A 03/31/2021  ? Procedure: LEFT HEART CATH AND CORS/GRAFTS ANGIOGRAPHY;  Surgeon: Leonie Man, MD;  Location: [place] CV LAB;  Service: Cardiovascular;  Laterality: N/A;  ? LEFT HEART CATHETERIZATION WITH CORONARY ANGIOGRAM N/A 08/31/2012  ? Procedure: LEFT HEART CATHETERIZATION WITH CORONARY ANGIOGRAM;  Surgeon: Peter M Martinique, MD;  Location: [facility] CATH LAB;  Service: Cardiovascular;  Laterality: N/A;  ? PERCUTANEOUS CORONARY STENT INTERVENTION (PCI-S) N/A 08/31/2012  ? Procedure: PERCUTANEOUS CORONARY STENT INTERVENTION (PCI-S);  Surgeon: Peter M Martinique, MD;  Location: [facility] CATH LAB;  Service: Cardiovascular;  Laterality: N/A;  ? RIGHT/LEFT HEART CATH AND CORONARY/GRAFT ANGIOGRAPHY N/A 06/04/2020  ? Procedure: RIGHT/LEFT HEART CATH AND CORONARY/GRAFT ANGIOGRAPHY;  Surgeon: Martinique, Peter M, MD;  Location: [place] CV LAB;  Service: Cardiovascular;  Laterality: N/A;  ? TEMPORARY PACEMAKER N/A 06/04/2020  ? Procedure: TEMPORARY PACEMAKER;  Surgeon: Martinique, Peter M, MD;  Location: [place] CV LAB;  Service: Cardiovascular;  Laterality: N/A;  ? ? ?Allergies ? ?No Known Allergies ? ?History of Present Illness  ?  ?Mr. Tregoning presents today after being seen 2 weeks ago with complaints of fatigue  and New York Heart Association class II 3 symptoms.  He was not in need of oxygen supplementation at that time but he continued to have a cough.  I recommended that he increase his torsemide by taking an extra dose when he feels short of breath or has increased coughing.  He was to follow-up with Dr. Haroldine Laws.  He was to have a follow-up be met in [place].  He was complaining of some nausea and vomiting and he was started on low-dose Zofran 4 mg as needed.  He is here for evaluation of his relief of symptoms or need for additional medical management.  It is noted that he is now under palliative care and home health nurses were drawing blood. ? ?BNP on 09/24/2021 was 382, potassium was low at 3.1 creatinine 2.73, was advised by his primary care provider Allena Katz, FNP to take extra doses of potassium and increase his dose of potassium to 40 mEq twice daily, he was not found to be anemic hemoglobin 14.2 hematocrit 45.9, he did have elevated white blood cells at 11.8, platelets were 433. ? ?He comes today feeling much better.  He is brighter, more interactive, his skin color is warmer and no longer pale.  Zofran has been very helpful and he has been able to eat.  He denies edema, significant dyspnea on exertion, chest  pressure, or dizziness.  He is in very good spirits.  He has no new complaints and reports that he is sleeping much better. ? ?Home Medications  ?  ?Current Outpatient Medications  ?Medication Sig Dispense Refill  ? amiodarone (PACERONE) 200 MG tablet Take 1 tablet (200 mg total) by mouth 2 (two) times daily. 60 tablet 3  ? apixaban (ELIQUIS) 5 MG TABS tablet Take 1 tablet (5 mg total) by mouth 2 (two) times daily. 60 tablet 3  ? atorvastatin (LIPITOR) 80 MG tablet TAKE 1 TABLET BY MOUTH DAILY AT 6:00 PM 90 tablet 3  ? clopidogrel (PLAVIX) 75 MG tablet Take 1 tablet (75 mg total) by mouth daily. 90 tablet 3  ? DOBUTamine (DOBUTREX) 4-5 MG/ML-% infusion Inject 193.5 mcg/min into the vein continuous. Per  AHC infusion 250 mL 12  ? escitalopram (LEXAPRO) 10 MG tablet Take 1 tablet (10 mg total) by mouth daily. 30 tablet 11  ? ezetimibe (ZETIA) 10 MG tablet Take 1 tablet (10 mg total) by mouth daily. 30 tablet 3

## 2021-10-05 ENCOUNTER — Other Ambulatory Visit: Payer: Self-pay

## 2021-10-05 ENCOUNTER — Encounter: Payer: Self-pay | Admitting: Adult Health

## 2021-10-05 ENCOUNTER — Ambulatory Visit: Payer: Medicare PPO | Admitting: Adult Health

## 2021-10-05 VITALS — BP 122/78 | HR 65 | Ht 67.0 in | Wt 176.8 lb

## 2021-10-05 DIAGNOSIS — Z950 Presence of cardiac pacemaker: Secondary | ICD-10-CM | POA: Diagnosis not present

## 2021-10-05 DIAGNOSIS — I255 Ischemic cardiomyopathy: Secondary | ICD-10-CM

## 2021-10-05 DIAGNOSIS — Z951 Presence of aortocoronary bypass graft: Secondary | ICD-10-CM

## 2021-10-05 DIAGNOSIS — I5042 Chronic combined systolic (congestive) and diastolic (congestive) heart failure: Secondary | ICD-10-CM | POA: Diagnosis not present

## 2021-10-05 DIAGNOSIS — Z95810 Presence of automatic (implantable) cardiac defibrillator: Secondary | ICD-10-CM

## 2021-10-05 DIAGNOSIS — I2510 Atherosclerotic heart disease of native coronary artery without angina pectoris: Secondary | ICD-10-CM | POA: Diagnosis not present

## 2021-10-05 DIAGNOSIS — E785 Hyperlipidemia, unspecified: Secondary | ICD-10-CM

## 2021-10-05 DIAGNOSIS — Z95818 Presence of other cardiac implants and grafts: Secondary | ICD-10-CM

## 2021-10-05 DIAGNOSIS — I5023 Acute on chronic systolic (congestive) heart failure: Secondary | ICD-10-CM

## 2021-10-05 DIAGNOSIS — I43 Cardiomyopathy in diseases classified elsewhere: Secondary | ICD-10-CM

## 2021-10-05 MED ORDER — TORSEMIDE 40 MG PO TABS: 80.0000 mg | ORAL_TABLET | Freq: Two times a day (BID) | ORAL | 4 refills | Status: AC

## 2021-10-05 NOTE — Patient Instructions (Signed)
Medication Instructions:  ?No Changes ?*If you need a refill on your cardiac medications before your next appointment, please call your pharmacy* ? ? ?Lab Work: ?No Labs ?If you have labs (blood work) drawn today and your tests are completely normal, you will receive your results only by: ?MyChart Message (if you have MyChart) OR ?A paper copy in the mail ?If you have any lab test that is abnormal or we need to change your treatment, we will call you to review the results. ? ? ?Testing/Procedures: ?No Testing ? ? ?Follow-Up: ?At [facility], you and your health needs are our priority.  As part of our continuing mission to provide you with exceptional heart care, we have created designated Provider Care Teams.  These Care Teams include your primary Cardiologist (physician) and Advanced Practice Providers (APPs -  Physician Assistants and Nurse Practitioners) who all work together to provide you with the care you need, when you need it. ? ?We recommend signing up for the patient portal called "MyChart".  Sign up information is provided on this After Visit Summary.  MyChart is used to connect with patients for Virtual Visits (Telemedicine).  Patients are able to view lab/test results, encounter notes, upcoming appointments, etc.  Non-urgent messages can be sent to your provider as well.   ?To learn more about what you can do with MyChart, go to [email or web address].   ? ?Your next appointment:   ?4 month(s) ? ?The format for your next appointment:   ?In Person ? ?Provider:   ?Sanda Klein, MD   ? ? ?  ?

## 2021-10-16 DIAGNOSIS — I5020 Unspecified systolic (congestive) heart failure: Secondary | ICD-10-CM | POA: Diagnosis not present

## 2021-10-16 DIAGNOSIS — C784 Secondary malignant neoplasm of small intestine: Secondary | ICD-10-CM | POA: Diagnosis not present

## 2021-10-16 DIAGNOSIS — I5021 Acute systolic (congestive) heart failure: Secondary | ICD-10-CM | POA: Diagnosis not present

## 2021-10-17 ENCOUNTER — Other Ambulatory Visit (HOSPITAL_COMMUNITY): Payer: Self-pay

## 2021-10-17 DIAGNOSIS — I2111 ST elevation (STEMI) myocardial infarction involving right coronary artery: Secondary | ICD-10-CM

## 2021-10-29 ENCOUNTER — Telehealth (HOSPITAL_COMMUNITY): Payer: Self-pay | Admitting: Vascular Surgery

## 2021-10-29 NOTE — Telephone Encounter (Signed)
LVM to move pt appt on 4/21, needs o be moved 4/18 or 4/25 ?

## 2021-10-30 ENCOUNTER — Encounter (HOSPITAL_COMMUNITY): Payer: Self-pay | Admitting: Internal Medicine

## 2021-10-30 ENCOUNTER — Ambulatory Visit (HOSPITAL_COMMUNITY)
Admission: RE | Admit: 2021-10-30 | Discharge: 2021-10-30 | Disposition: A | Discharge status: Home / Self Care | Payer: Medicare PPO | Source: Ambulatory Visit | Attending: Internal Medicine | Admitting: Internal Medicine

## 2021-10-30 VITALS — BP 108/78 | HR 72 | Wt 185.4 lb

## 2021-10-30 DIAGNOSIS — N184 Chronic kidney disease, stage 4 (severe): Secondary | ICD-10-CM | POA: Diagnosis not present

## 2021-10-30 DIAGNOSIS — I5084 End stage heart failure: Secondary | ICD-10-CM | POA: Insufficient documentation

## 2021-10-30 DIAGNOSIS — Z95810 Presence of automatic (implantable) cardiac defibrillator: Secondary | ICD-10-CM | POA: Insufficient documentation

## 2021-10-30 DIAGNOSIS — Z79899 Other long term (current) drug therapy: Secondary | ICD-10-CM | POA: Insufficient documentation

## 2021-10-30 DIAGNOSIS — C784 Secondary malignant neoplasm of small intestine: Secondary | ICD-10-CM | POA: Diagnosis not present

## 2021-10-30 DIAGNOSIS — I5022 Chronic systolic (congestive) heart failure: Secondary | ICD-10-CM | POA: Diagnosis not present

## 2021-10-30 DIAGNOSIS — I5042 Chronic combined systolic (congestive) and diastolic (congestive) heart failure: Secondary | ICD-10-CM | POA: Diagnosis not present

## 2021-10-30 DIAGNOSIS — Z951 Presence of aortocoronary bypass graft: Secondary | ICD-10-CM | POA: Insufficient documentation

## 2021-10-30 DIAGNOSIS — N179 Acute kidney failure, unspecified: Secondary | ICD-10-CM | POA: Diagnosis not present

## 2021-10-30 DIAGNOSIS — Z86718 Personal history of other venous thrombosis and embolism: Secondary | ICD-10-CM | POA: Diagnosis not present

## 2021-10-30 DIAGNOSIS — I252 Old myocardial infarction: Secondary | ICD-10-CM | POA: Diagnosis not present

## 2021-10-30 DIAGNOSIS — I5082 Biventricular heart failure: Secondary | ICD-10-CM | POA: Insufficient documentation

## 2021-10-30 DIAGNOSIS — Z8774 Personal history of (corrected) congenital malformations of heart and circulatory system: Secondary | ICD-10-CM | POA: Diagnosis not present

## 2021-10-30 DIAGNOSIS — Z8673 Personal history of transient ischemic attack (TIA), and cerebral infarction without residual deficits: Secondary | ICD-10-CM | POA: Diagnosis not present

## 2021-10-30 DIAGNOSIS — I959 Hypotension, unspecified: Secondary | ICD-10-CM | POA: Diagnosis not present

## 2021-10-30 DIAGNOSIS — E1122 Type 2 diabetes mellitus with diabetic chronic kidney disease: Secondary | ICD-10-CM | POA: Insufficient documentation

## 2021-10-30 DIAGNOSIS — Z66 Do not resuscitate: Secondary | ICD-10-CM | POA: Diagnosis not present

## 2021-10-30 DIAGNOSIS — I5021 Acute systolic (congestive) heart failure: Secondary | ICD-10-CM | POA: Diagnosis not present

## 2021-10-30 DIAGNOSIS — Z7901 Long term (current) use of anticoagulants: Secondary | ICD-10-CM | POA: Diagnosis not present

## 2021-10-30 DIAGNOSIS — Z7902 Long term (current) use of antithrombotics/antiplatelets: Secondary | ICD-10-CM | POA: Insufficient documentation

## 2021-10-30 DIAGNOSIS — I130 Hypertensive heart and chronic kidney disease with heart failure and stage 1 through stage 4 chronic kidney disease, or unspecified chronic kidney disease: Secondary | ICD-10-CM | POA: Diagnosis not present

## 2021-10-30 DIAGNOSIS — I2510 Atherosclerotic heart disease of native coronary artery without angina pectoris: Secondary | ICD-10-CM | POA: Diagnosis not present

## 2021-10-30 DIAGNOSIS — Z955 Presence of coronary angioplasty implant and graft: Secondary | ICD-10-CM | POA: Diagnosis not present

## 2021-10-30 DIAGNOSIS — I5020 Unspecified systolic (congestive) heart failure: Secondary | ICD-10-CM | POA: Diagnosis not present

## 2021-10-30 DIAGNOSIS — Z8674 Personal history of sudden cardiac arrest: Secondary | ICD-10-CM | POA: Insufficient documentation

## 2021-10-30 LAB — BASIC METABOLIC PANEL WITH GFR
Anion gap: 10 (ref 5–15)
BUN: 36 mg/dL — ABNORMAL HIGH (ref 8–23)
CO2: 27 mmol/L (ref 22–32)
Calcium: 9.1 mg/dL (ref 8.9–10.3)
Chloride: 97 mmol/L — ABNORMAL LOW (ref 98–111)
Creatinine, Ser: 2.18 mg/dL — ABNORMAL HIGH (ref 0.61–1.24)
GFR, Estimated: 32 mL/min — ABNORMAL LOW
Glucose, Bld: 103 mg/dL — ABNORMAL HIGH (ref 70–99)
Potassium: 4.3 mmol/L (ref 3.5–5.1)
Sodium: 134 mmol/L — ABNORMAL LOW (ref 135–145)

## 2021-10-30 LAB — BRAIN NATRIURETIC PEPTIDE: B Natriuretic Peptide: 1114.3 pg/mL — ABNORMAL HIGH (ref 0.0–100.0)

## 2021-10-30 MED ORDER — METOLAZONE 2.5 MG PO TABS: 2.5000 mg | ORAL_TABLET | ORAL | 3 refills | Status: DC

## 2021-10-30 MED ORDER — POTASSIUM CHLORIDE CRYS ER 20 MEQ PO TBCR: EXTENDED_RELEASE_TABLET | ORAL | 0 refills | Status: AC

## 2021-10-30 NOTE — Progress Notes (Signed)
? ?ADVANCED HF CLINIC NOTE ? ?Primary Care: HamrickLorin Mercy, MD ?HF Cardiologist: Dr. Haroldine Laws ? ?HPI: ? ?[name] is a 66 y.o. male with history of CAD s/p CABG in 2014 (LIMA to LAD, SVG to RCA, SVG to OM2) followed by inferior STEMI 09/20 s/p DES SVG to RCA anastomosis and STEMI 10/21 s/p POBA SVG to PDA, hx VT arrest (suspected scar mediated) s/p dual-chamber ICD, chronic systolic CHF/ICM, hx CVA, hx bilateral PE and b/l DVT, hx possible LV thrombus. ?  ?Admitted 11/21 with VT that converted to asystole. Required external pacing. Cardiac cath with stable CAD. EF 30-35%. Seen by AHF service at that time. Underwent dual chamber ICD during admit.  ?  ?Admitted 09/22 with inferoposterior STEMI. Cath with thrombotic occlusion of SVG to OM1. SVG to RCA patent, unable to assess LIMA to LAD. He was treated with Aggrastat followed by aspirin, plavix and Eliquis. EF 20-25% on echo that admit.  ?  ?Admitted 1/23 with acute bi-ventricular failure. Attempted to diurese with IV lasix but developed hypotension. Entresto, spiro and metoprolol held. Continued on SGLT2i. Scr up from 1.45 >> 2.35 on admit, concern for cardiorenal/low output. Echo showed EF 20-25%, moderate RV dysfunction. AHF consulted. Started on DBA 2.5. DBA stopped and renal function and Co-Ox declined. Patient not interested in advanced therapies, home DBA arranged. PMT consulted and he was a DNR/DNI. Discharged home on midodrine 15 tid and torsemide 40 daily via tunneled PICC, weight 170 lbs. ? ?Follow up 3/23 he was markedly volume up. ReDs 48%, wt 186 lbs. DBA pump had been malfunctioning x several days, and he was also off midodrine. Torsemide increased to 80 bid, midodrine restarted and instructed to take metolazone 5 mg x 3 days with extra 40 KCL. Discussed eventual need for hospice. ? ?Today he returns for HF follow up. He is here with his wife. Remains on DBA. Says he feels better than he has in a long time. Doing ADLs without problem Denies  edema, orthopnea or PND.  ? ?ReDS 42% ? ? ?Cardiac Studies: ?- Echo (11/21): EF 30-35% ? ?- Echo (9/22): EF 20-25%  ? ?- Echo (1/23): 20-25%, moderate RV dysfunction, severe bi-atrial dilation, mild MR ? ?Past Medical History:  ?Diagnosis Date  ? Acute pulmonary embolism without acute cor pulmonale (HCC)   ? PE-DVT during hospitalization Nov-Dec 2021 On Eliquis at discharge  ? Acute ST elevation myocardial infarction (STEMI) involving other coronary artery of inferior wall ([place]) 03/31/2021  ? Acute ST elevation myocardial infarction (STEMI) of inferior wall ([place]) 03/31/2021  ? Borderline diabetic   ? Cardiogenic shock ([place])   ? Coronary artery disease   ? a. s/p CABG in 2014 with LIMA-LAD, SVG-OM2 and SVG-RC b. 03/2019: STEMI and s/p angioplasty to proximal SVG-RCA and DES to SVG-RCA anastomosis c. 04/2020: recurrent STEMI with angioplasty of SVG-RCA  ? Dyslipidemia 01/11/2013  ? Hiatal hernia   ? Hypertension   ? Mini stroke   ? 2  ? Respiratory failure with hypoxia ([place])   ? S/P CABG x 3   ? Sleep disturbance   ? ST elevation myocardial infarction (STEMI) ([place]) 09/01/2012  ? Pt presented 03/23/2019 with inferior STEMI complicated by respiratory failure- intubation  ? STEMI (ST elevation myocardial infarction) ([place])   ? Stroke [facility])   ? Transaminitis   ? ? ?Current Outpatient Medications  ?Medication Sig Dispense Refill  ? amiodarone (PACERONE) 200 MG tablet Take 1 tablet (200 mg total) by mouth 2 (two) times daily. [place]  tablet 3  ? apixaban (ELIQUIS) 5 MG TABS tablet Take 1 tablet (5 mg total) by mouth 2 (two) times daily. 60 tablet 3  ? atorvastatin (LIPITOR) 80 MG tablet TAKE 1 TABLET BY MOUTH DAILY AT 6:00 PM 90 tablet 3  ? clopidogrel (PLAVIX) 75 MG tablet Take 1 tablet (75 mg total) by mouth daily. 90 tablet 3  ? DOBUTamine (DOBUTREX) 4-5 MG/ML-% infusion Inject 193.5 mcg/min into the vein continuous. Per AHC infusion 250 mL 12  ? escitalopram (LEXAPRO) 10 MG tablet Take 1 tablet (10 mg total) by mouth daily. 30  tablet 11  ? ezetimibe (ZETIA) 10 MG tablet Take 1 tablet (10 mg total) by mouth daily. 30 tablet 3  ? metolazone (ZAROXOLYN) 5 MG tablet Take 1 tablet (5 mg total) by mouth daily. 3 tablet 0  ? midodrine (PROAMATINE) 5 MG tablet Take 1 tablet (5 mg total) by mouth 3 (three) times daily with meals. 90 tablet 4  ? Multiple Vitamins-Minerals (ONE-A-DAY MENS 50+ ADVANTAGE) TABS Take 1 tablet by mouth daily with breakfast.    ? nitroGLYCERIN (NITROSTAT) 0.4 MG SL tablet Place 1 tablet (0.4 mg total) under the tongue every 5 (five) minutes x 3 doses as needed for chest pain. 25 tablet 2  ? nystatin (MYCOSTATIN/NYSTOP) powder APPLY TOPICALLY TO THE AFFECTED AREA 2 TIMES A DAY FOR 2 WEEKS (Patient taking differently: 1 application. 2 (two) times daily as needed (rash).) 30 g 0  ? ondansetron (ZOFRAN) 4 MG tablet Take 1 tablet (4 mg total) by mouth every 8 (eight) hours as needed for nausea or vomiting. 30 tablet 0  ? potassium chloride SA (KLOR-CON M) 20 MEQ tablet TAKE 2 TABLETS (40 MEQ TOTAL) BY MOUTH AS DIRECTED WITH EACH METOLAZONE DOSE 30 tablet 0  ? Torsemide 40 MG TABS Take 80 mg by mouth 2 (two) times daily. 120 tablet 4  ? ?No current facility-administered medications for this encounter.  ? ? ?No Known Allergies ? ?  ?Social History  ? ?Socioeconomic History  ? Marital status: Married  ?  Spouse name: [name]  ? Number of children: 2  ? Years of education: Not on file  ? Highest education level: 11th grade  ?Occupational History  ? Occupation: retired  ?  Comment: large Database administrator  ?Tobacco Use  ? Smoking status: Former  ?  Types: Pipe  ? Smokeless tobacco: Never  ?Vaping Use  ? Vaping Use: Never used  ?Substance and Sexual Activity  ? Alcohol use: No  ? Drug use: No  ? Sexual activity: Not Currently  ?Other Topics Concern  ? Not on file  ?Social History Narrative  ? Not on file  ? ?Social Determinants of Health  ? ?Financial Resource Strain: Medium Risk  ? Difficulty of Paying Living Expenses: Somewhat hard   ?Food Insecurity: No Food Insecurity  ? Worried About Charity fundraiser in the Last Year: Never true  ? Ran Out of Food in the Last Year: Never true  ?Transportation Needs: No Transportation Needs  ? Lack of Transportation (Medical): No  ? Lack of Transportation (Non-Medical): No  ?Physical Activity: Not on file  ?Stress: Not on file  ?Social Connections: Not on file  ?Intimate Partner Violence: Not on file  ? ?Family History  ?Problem Relation Age of Onset  ? Cancer Father   ? Hypertension Father   ? Hyperlipidemia Father   ? Heart attack Father   ? Hypertension Sister   ? Hypertension Brother   ?  Hyperlipidemia Brother   ? Heart attack Sister   ? Heart attack Brother   ? ?BP 108/78   Pulse 72   Wt 84.1 kg (185 lb 6.4 oz)   SpO2 98%   BMI 29.04 kg/m?  ? ?Wt Readings from Last 3 Encounters:  ?10/30/21 84.1 kg (185 lb 6.4 oz)  ?10/05/21 80.2 kg (176 lb 12.8 oz)  ?09/24/21 77 kg (169 lb 12.8 oz)  ? ?PHYSICAL EXAM: ?General:  Well appearing. No resp difficulty ?HEENT: normal ?Neck: supple. JVP 8 Carotids 2+ bilat; no bruits. No lymphadenopathy or thryomegaly appreciated. ?Cor: PMI nondisplaced. Regular rate & rhythm. No rubs, gallops or murmurs. ?Lungs: clear ?Abdomen: soft, nontender, nondistended. No hepatosplenomegaly. No bruits or masses. Good bowel sounds. ?Extremities: no cyanosis, clubbing, rash, trace edema  PICC site ok  ?Neuro: alert & orientedx3, cranial nerves grossly intact. moves all 4 extremities w/o difficulty. Affect pleasant ? ?Device interrogation:  unable to interrogate device in clinic ? ? ?ASSESSMENT & PLAN: ?1. End-stage chronic systolic CHF with biventricular failure/ICM: ?- Echo 05/04/20 EF 25%. Moderate RV to severe RV dysfunction and possible LV thrombus.  ?- RHC 10/21 with preserved output and well compensated filling pressures.  ?- Echo 11/21 EF 25-30%  ?- Echo 01/23: EF 20-25% Moderate RV dysfunction.  ?- Started on DBA 2.5 during 1/23 admission, failed wean and continued palliative  home inotrope.  ?- NYHA III-IIIb, functional status difficult due to general deconditioning. Volume looks good today, weight down 17 lbs, ReDs down from 48%--->28% today. ?- Continue midodrine 5 mg tid. ?- Co

## 2021-10-30 NOTE — Patient Instructions (Signed)
Medication Changes: ? ?Take Metolazone 2.5 mg every Tuesday, can take extra tab for weight 182 lbs or greater ? ?Take an extra 40 meq (2 tabs) of Potassium when you take Metolazone ? ?Lab Work: ? ?Labs done today, we will call you for abnormal results ? ?Testing/Procedures: ? ?none ? ?Referrals: ? ?none ? ?Special Instructions // Education: ? ?Do the following things EVERYDAY: ?Weigh yourself in the morning before breakfast. Write it down and keep it in a log. ?Take your medicines as prescribed ?Eat low salt foods--Limit salt (sodium) to 2000 mg per day.  ?Stay as active as you can everyday ?Limit all fluids for the day to less than 2 liters ? ? ?Follow-Up in: 6 weeks ? ?At the [facility], you and your health needs are our priority. We have a designated team specialized in the treatment of Heart Failure. This Care Team includes your primary Heart Failure Specialized Cardiologist (physician), Advanced Practice Providers (APPs- Physician Assistants and Nurse Practitioners), and Pharmacist who all work together to provide you with the care you need, when you need it.  ? ?You may see any of the following providers on your designated Care Team at your next follow up: ? ?Dr Glori Bickers ?Dr Loralie Champagne ?Darrick Grinder, NP ?Lyda Jester, PA ?Jessica Milford,NP ?Marlyce Huge, PA ?Audry Riles, PharmD ? ? ?Please be sure to bring in all your medications bottles to every appointment.  ? ?Need to Contact us: ? ?If you have any questions or concerns before your next appointment please send [place] a message through [place] or call our office at [phone].   ? ?TO LEAVE A MESSAGE FOR THE NURSE SELECT OPTION 2, PLEASE LEAVE A MESSAGE INCLUDING: ?YOUR NAME ?DATE OF BIRTH ?CALL BACK NUMBER ?REASON FOR CALL**this is important as we prioritize the call backs ? ?YOU WILL RECEIVE A CALL BACK THE SAME DAY AS LONG AS YOU CALL BEFORE 4:00 PM ? ? ?

## 2021-11-02 ENCOUNTER — Encounter (HOSPITAL_COMMUNITY): Payer: Medicare PPO | Admitting: Internal Medicine

## 2021-11-07 ENCOUNTER — Ambulatory Visit (INDEPENDENT_AMBULATORY_CARE_PROVIDER_SITE_OTHER): Payer: Medicare PPO | Admitting: Cardiology

## 2021-11-07 ENCOUNTER — Encounter: Payer: Self-pay | Admitting: Cardiology

## 2021-11-07 VITALS — BP 122/60 | HR 71 | Ht 67.0 in | Wt 178.8 lb

## 2021-11-07 DIAGNOSIS — I255 Ischemic cardiomyopathy: Secondary | ICD-10-CM | POA: Diagnosis not present

## 2021-11-07 DIAGNOSIS — I472 Ventricular tachycardia, unspecified: Secondary | ICD-10-CM

## 2021-11-07 DIAGNOSIS — Z95810 Presence of automatic (implantable) cardiac defibrillator: Secondary | ICD-10-CM

## 2021-11-07 DIAGNOSIS — I5042 Chronic combined systolic (congestive) and diastolic (congestive) heart failure: Secondary | ICD-10-CM

## 2021-11-07 LAB — CUP PACEART INCLINIC DEVICE CHECK
Battery Remaining Longevity: 99 mo
Brady Statistic RA Percent Paced: 0.16 %
Brady Statistic RV Percent Paced: 0.07 %
Date Time Interrogation Session: 20230426161735
HighPow Impedance: 77.625
Implantable Lead Implant Date: 20211206
Implantable Lead Implant Date: 20211206
Implantable Lead Location: 753859
Implantable Lead Location: 753860
Implantable Pulse Generator Implant Date: 20211206
Lead Channel Impedance Value: 412.5 Ohm
Lead Channel Impedance Value: 437.5 Ohm
Lead Channel Pacing Threshold Amplitude: 0.75 V
Lead Channel Pacing Threshold Amplitude: 0.75 V
Lead Channel Pacing Threshold Amplitude: 1 V
Lead Channel Pacing Threshold Amplitude: 1 V
Lead Channel Pacing Threshold Pulse Width: 0.5 ms
Lead Channel Pacing Threshold Pulse Width: 0.5 ms
Lead Channel Pacing Threshold Pulse Width: 0.5 ms
Lead Channel Pacing Threshold Pulse Width: 0.5 ms
Lead Channel Sensing Intrinsic Amplitude: 3.6 mV
Lead Channel Sensing Intrinsic Amplitude: 6.6 mV
Lead Channel Setting Pacing Amplitude: 2 V
Lead Channel Setting Pacing Amplitude: 2.5 V
Lead Channel Setting Pacing Pulse Width: 0.5 ms
Lead Channel Setting Sensing Sensitivity: 0.5 mV
Pulse Gen Serial Number: 810012347

## 2021-11-07 NOTE — Patient Instructions (Signed)
Medication Instructions:  ?Your physician recommends that you continue on your current medications as directed. Please refer to the Current Medication list given to you today. ?*If you need a refill on your cardiac medications before your next appointment, please call your pharmacy* ? ?Lab Work: ?None. ?If you have labs (blood work) drawn today and your tests are completely normal, you will receive your results only by: ?MyChart Message (if you have MyChart) OR ?A paper copy in the mail ?If you have any lab test that is abnormal or we need to change your treatment, we will call you to review the results. ? ?Testing/Procedures: ?None. ? ?Follow-Up: ?At [facility], you and your health needs are our priority.  As part of our continuing mission to provide you with exceptional heart care, we have created designated Provider Care Teams.  These Care Teams include your primary Cardiologist (physician) and Advanced Practice Providers (APPs -  Physician Assistants and Nurse Practitioners) who all work together to provide you with the care you need, when you need it. ? ?Your physician wants you to follow-up in: As needed with Lars Mage, MD. ? ?We recommend signing up for the patient portal called "MyChart".  Sign up information is provided on this After Visit Summary.  MyChart is used to connect with patients for Virtual Visits (Telemedicine).  Patients are able to view lab/test results, encounter notes, upcoming appointments, etc.  Non-urgent messages can be sent to your provider as well.   ?To learn more about what you can do with MyChart, go to [email or web address].   ? ?Any Other Special Instructions Will Be Listed Below (If Applicable). ? ? ? ? ?  ? ? ?
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
## 2021-11-13 DIAGNOSIS — I255 Ischemic cardiomyopathy: Secondary | ICD-10-CM | POA: Diagnosis not present

## 2021-11-13 DIAGNOSIS — C784 Secondary malignant neoplasm of small intestine: Secondary | ICD-10-CM | POA: Diagnosis not present

## 2021-11-13 DIAGNOSIS — I5020 Unspecified systolic (congestive) heart failure: Secondary | ICD-10-CM | POA: Diagnosis not present

## 2021-11-27 DIAGNOSIS — C784 Secondary malignant neoplasm of small intestine: Secondary | ICD-10-CM | POA: Diagnosis not present

## 2021-11-27 DIAGNOSIS — I5020 Unspecified systolic (congestive) heart failure: Secondary | ICD-10-CM | POA: Diagnosis not present

## 2021-11-27 DIAGNOSIS — I5022 Chronic systolic (congestive) heart failure: Secondary | ICD-10-CM | POA: Diagnosis not present

## 2021-11-27 DIAGNOSIS — I5021 Acute systolic (congestive) heart failure: Secondary | ICD-10-CM | POA: Diagnosis not present

## 2021-12-11 NOTE — Progress Notes (Signed)
ADVANCED HF CLINIC NOTE  Primary Care: Hamrick, Lorin Mercy, MD HF Cardiologist: Dr. Haroldine Laws  HPI: [name] is a 67 y.o. male with history of CAD s/p CABG in 2014 (LIMA to LAD, SVG to RCA, SVG to OM2) followed by inferior STEMI 09/20 s/p DES SVG to RCA anastomosis and STEMI 10/21 s/p POBA SVG to PDA, hx VT arrest (suspected scar mediated) s/p dual-chamber ICD, chronic systolic CHF/ICM, hx CVA, hx bilateral PE and b/l DVT, hx possible LV thrombus.   Admitted 11/21 with VT that converted to asystole. Required external pacing. Cardiac cath with stable CAD. EF 30-35%. Seen by AHF service at that time. Underwent dual chamber ICD during admit.    Admitted 09/22 with inferoposterior STEMI. Cath with thrombotic occlusion of SVG to OM1. SVG to RCA patent, unable to assess LIMA to LAD. He was treated with Aggrastat followed by aspirin, plavix and Eliquis. EF 20-25% on echo that admit.    Admitted 1/23 with acute bi-ventricular failure. Attempted to diurese with IV lasix but developed hypotension. Entresto, spiro and metoprolol held. Continued on SGLT2i. Scr up from 1.45 >> 2.35 on admit, concern for cardiorenal/low output. Echo showed EF 20-25%, moderate RV dysfunction. AHF consulted. Started on DBA 2.5. DBA stopped and renal function and Co-Ox declined. Patient not interested in advanced therapies, home DBA arranged. PMT consulted and he was a DNR/DNI. Discharged home on midodrine 15 tid and torsemide 40 daily via tunneled PICC, weight 170 lbs.  Follow up 3/23 he was markedly volume up. ReDs 48%, wt 186 lbs. DBA pump had been malfunctioning x several days, and he was also off midodrine. Torsemide increased to 80 bid, midodrine restarted and instructed to take metolazone 5 mg x 3 days with extra 40 KCL. Discussed eventual need for hospice.  Follow up 4/23, symptoms much improved. Mild volume overload and metolazone 2.5 started weekly. Saw EP 4/23, patient decided to disable tachycardia therapies on  ICD.  Today he returns for HF follow up with his wife. Overall feeling "the best I have felt in a long time." No SOB with ADLs or walking up stairs. Able to cut grass on riding mower with no issues. Denies palpitations,  CP, dizziness, edema, or PND/Orthopnea. Appetite ok. No fever or chills. Weight at home 180 pounds. Taking all medications. No issues with IV pump. Has not had to take extra metolazone.   Cardiac Studies: - Echo (11/21): EF 30-35%  - Echo (9/22): EF 20-25%   - Echo (1/23): EF 20-25%, moderate RV dysfunction, severe bi-atrial dilation, mild MR  Past Medical History:  Diagnosis Date   Acute pulmonary embolism without acute cor pulmonale (HCC)    PE-DVT during hospitalization Nov-Dec 2021 On Eliquis at discharge   Acute ST elevation myocardial infarction (STEMI) involving other coronary artery of inferior wall (HCC) 03/31/2021   Acute ST elevation myocardial infarction (STEMI) of inferior wall ([place]) 03/31/2021   Borderline diabetic    Cardiogenic shock (HCC)    Coronary artery disease    a. s/p CABG in 2014 with LIMA-LAD, SVG-OM2 and SVG-RC b. 03/2019: STEMI and s/p angioplasty to proximal SVG-RCA and DES to SVG-RCA anastomosis c. 04/2020: recurrent STEMI with angioplasty of SVG-RCA   Dyslipidemia 01/11/2013   Hiatal hernia    Hypertension    Mini stroke    2   Respiratory failure with hypoxia (HCC)    S/P CABG x 3    Sleep disturbance    ST elevation myocardial infarction (STEMI) ([place]) 09/01/2012   Pt  presented 03/23/2019 with inferior STEMI complicated by respiratory failure- intubation   STEMI (ST elevation myocardial infarction) ([place])    Stroke (HCC)    Transaminitis    Current Outpatient Medications  Medication Sig Dispense Refill   amiodarone (PACERONE) 200 MG tablet Take 1 tablet (200 mg total) by mouth 2 (two) times daily. 60 tablet 3   apixaban (ELIQUIS) 5 MG TABS tablet Take 1 tablet (5 mg total) by mouth 2 (two) times daily. 60 tablet 3   atorvastatin  (LIPITOR) 80 MG tablet TAKE 1 TABLET BY MOUTH DAILY AT 6:00 PM 90 tablet 3   clopidogrel (PLAVIX) 75 MG tablet Take 1 tablet (75 mg total) by mouth daily. 90 tablet 3   DOBUTamine (DOBUTREX) 4-5 MG/ML-% infusion Inject 193.5 mcg/min into the vein continuous. Per [facility] infusion 250 mL 12   escitalopram (LEXAPRO) 10 MG tablet Take 1 tablet (10 mg total) by mouth daily. 30 tablet 11   ezetimibe (ZETIA) 10 MG tablet Take 1 tablet (10 mg total) by mouth daily. 30 tablet 3   metolazone (ZAROXOLYN) 2.5 MG tablet Take 1 tablet (2.5 mg total) by mouth once a week. Every Tuesday, can take extra tab for weight 182 lb or greater 10 tablet 3   midodrine (PROAMATINE) 5 MG tablet Take 1 tablet (5 mg total) by mouth 3 (three) times daily with meals. 90 tablet 4   Multiple Vitamins-Minerals (ONE-A-DAY MENS 50+ ADVANTAGE) TABS Take 1 tablet by mouth daily with breakfast.     nitroGLYCERIN (NITROSTAT) 0.4 MG SL tablet Place 1 tablet (0.4 mg total) under the tongue every 5 (five) minutes x 3 doses as needed for chest pain. 25 tablet 2   nystatin (MYCOSTATIN/NYSTOP) powder APPLY TOPICALLY TO THE AFFECTED AREA 2 TIMES A DAY FOR 2 WEEKS 30 g 0   ondansetron (ZOFRAN) 4 MG tablet Take 1 tablet (4 mg total) by mouth every 8 (eight) hours as needed for nausea or vomiting. 30 tablet 0   potassium chloride SA (KLOR-CON M) 20 MEQ tablet TAKE 2 TABLETS (40 MEQ TOTAL) BY MOUTH when you take METOLAZONE 30 tablet 0   Torsemide 40 MG TABS Take 80 mg by mouth 2 (two) times daily. 120 tablet 4   No current facility-administered medications for this encounter.   No Known Allergies  Social History   Socioeconomic History   Marital status: Married    Spouse name: [name]   Number of children: 2   Years of education: Not on file   Highest education level: 11th grade  Occupational History   Occupation: retired    Comment: large Database administrator  Tobacco Use   Smoking status: Former    Types: Pipe   Smokeless tobacco: Never   Scientific laboratory technician Use: Never used  Substance and Sexual Activity   Alcohol use: No   Drug use: No   Sexual activity: Not Currently  Other Topics Concern   Not on file  Social History Narrative   Not on file   Social Determinants of Health   Financial Resource Strain: Medium Risk   Difficulty of Paying Living Expenses: Somewhat hard  Food Insecurity: No Food Insecurity   Worried About Charity fundraiser in the Last Year: Never true   Arboriculturist in the Last Year: Never true  Transportation Needs: No Transportation Needs   Lack of Transportation (Medical): No   Lack of Transportation (Non-Medical): No  Physical Activity: Not on file  Stress: Not on file  Social Connections: Not on file  Intimate Partner Violence: Not on file   Family History  Problem Relation Age of Onset   Cancer Father    Hypertension Father    Hyperlipidemia Father    Heart attack Father    Hypertension Sister    Hypertension Brother    Hyperlipidemia Brother    Heart attack Sister    Heart attack Brother    BP 98/66   Pulse 62   Wt 84.3 kg (185 lb 12.8 oz)   SpO2 98%   BMI 29.10 kg/m   Wt Readings from Last 3 Encounters:  12/13/21 84.3 kg (185 lb 12.8 oz)  11/07/21 81.1 kg (178 lb 12.8 oz)  10/30/21 84.1 kg (185 lb 6.4 oz)   PHYSICAL EXAM: General:  NAD. No resp difficulty, walked into clinic, chronically-ill appearing HEENT: Normal Neck: Supple. JVP 6-7. Carotids 2+ bilat; no bruits. No lymphadenopathy or thryomegaly appreciated. Cor: PMI nondisplaced. Regular rate & rhythm. No rubs, gallops or murmurs. Lungs: Clear Abdomen: Soft, nontender, nondistended. No hepatosplenomegaly. No bruits or masses. Good bowel sounds. Extremities: No cyanosis, clubbing, rash, edema; PICC site ok Neuro: Alert & oriented x 3, cranial nerves grossly intact. Moves all 4 extremities w/o difficulty. Affect pleasant.  REDs: 31%  ASSESSMENT & PLAN: 1. End-stage chronic systolic CHF with biventricular  failure/ICM: - Echo 05/04/20 EF 25%. Moderate RV to severe RV dysfunction and possible LV thrombus.  - [place] 10/21 with preserved output and well compensated filling pressures.  - Echo 11/21 EF 25-30%  - Echo 01/23: EF 20-25% Moderate RV dysfunction.  - Started on DBA 2.5 during 1/23 admission, failed wean and continued palliative home inotrope.  - Improved NYHA III, functional status difficult due to general deconditioning. Volume looks good today, ReDs 31%. Discussed taking extra metolazone + 40 KCL if weight > 182 lbs on home scale. Suspect he will need to take an extra dose in the next week or two. - Continue midodrine 5 mg tid. - Continue torsemide 80 mg bid + metolazone 2.5 mg + 40 KCL every Tuesday.  - Off Entresto and spiro with AKI/hypotension. - SGLT2i stopped with AKI. - Off beta blocker with a/c CHF. - He is not interested in advanced therapies (VAD/transplant). - Recent labs ok. K 3.5, SCr 1.81. Had labs drawn 2 days ago, awaiting results.   2. CAD: - s/p CABG 2014 - Inferior STEMI 09/20 s/p DES SVG to RCA anastomosis - Inferior STEMI 05/03/20 s/p POBA SVG->PDA - Inferoposterior STEMI 09/22 >> thrombotic occlusion SVG to OM1 - No s/s angina. - On plavix. No ASA d/t anticoagulation.  - Continue atorvastatin 80.   3. Hx VT arrest/frequent PVCs: - S/p dual chamber ICD placement. Therapy now disabled. - Continue amio.   4. Hx Bilateral PE/b/l DVT: - Anticoagulated with Eliquis. - No bleeding.   5. Possible LV thrombus: - Noted on echo 10/21, TEE 11/21 with no thrombus.   6. CKD IV: - Baseline Scr previously 1.4>>3.12 on discharge in 1/23 - New baseline ~2.6-2.7 - On DBA 2.5   7. GOC - DNR/DNI. ICD de-activated. - Not interested in Hospice.  Looks good today.   Follow up with APP in 4 weeks to ensure fluid stable on current regimen and 2-3 months with Dr. Haroldine Laws.  Rafael Bihari, FNP  1:07 PM 12/13/21

## 2021-12-12 ENCOUNTER — Telehealth (HOSPITAL_COMMUNITY): Payer: Self-pay

## 2021-12-12 ENCOUNTER — Other Ambulatory Visit (HOSPITAL_COMMUNITY): Payer: Self-pay | Admitting: Internal Medicine

## 2021-12-12 DIAGNOSIS — C784 Secondary malignant neoplasm of small intestine: Secondary | ICD-10-CM | POA: Diagnosis not present

## 2021-12-12 DIAGNOSIS — I5020 Unspecified systolic (congestive) heart failure: Secondary | ICD-10-CM | POA: Diagnosis not present

## 2021-12-12 NOTE — Telephone Encounter (Signed)
Called and left patient a detailed message to confirm/remind patient of their appointment at the [facility] on 12/13/21.

## 2021-12-12 NOTE — Telephone Encounter (Deleted)
error 

## 2021-12-12 NOTE — Telephone Encounter (Signed)
error 

## 2021-12-13 ENCOUNTER — Telehealth (HOSPITAL_COMMUNITY): Payer: Self-pay | Admitting: Family Medicine

## 2021-12-13 ENCOUNTER — Other Ambulatory Visit (HOSPITAL_COMMUNITY): Payer: Self-pay | Admitting: Family Medicine

## 2021-12-13 ENCOUNTER — Ambulatory Visit (HOSPITAL_COMMUNITY)
Admission: RE | Admit: 2021-12-13 | Discharge: 2021-12-13 | Disposition: A | Discharge status: Home / Self Care | Payer: Medicare PPO | Source: Ambulatory Visit | Attending: Family Medicine | Admitting: Family Medicine

## 2021-12-13 ENCOUNTER — Encounter (HOSPITAL_COMMUNITY): Payer: Self-pay

## 2021-12-13 ENCOUNTER — Telehealth (HOSPITAL_COMMUNITY): Payer: Self-pay | Admitting: Cardiology

## 2021-12-13 VITALS — BP 98/66 | HR 62 | Wt 185.8 lb

## 2021-12-13 DIAGNOSIS — I252 Old myocardial infarction: Secondary | ICD-10-CM | POA: Insufficient documentation

## 2021-12-13 DIAGNOSIS — N184 Chronic kidney disease, stage 4 (severe): Secondary | ICD-10-CM | POA: Insufficient documentation

## 2021-12-13 DIAGNOSIS — Z86711 Personal history of pulmonary embolism: Secondary | ICD-10-CM | POA: Diagnosis not present

## 2021-12-13 DIAGNOSIS — I493 Ventricular premature depolarization: Secondary | ICD-10-CM | POA: Diagnosis not present

## 2021-12-13 DIAGNOSIS — Z8673 Personal history of transient ischemic attack (TIA), and cerebral infarction without residual deficits: Secondary | ICD-10-CM | POA: Insufficient documentation

## 2021-12-13 DIAGNOSIS — Z8249 Family history of ischemic heart disease and other diseases of the circulatory system: Secondary | ICD-10-CM | POA: Diagnosis not present

## 2021-12-13 DIAGNOSIS — Z79899 Other long term (current) drug therapy: Secondary | ICD-10-CM | POA: Diagnosis not present

## 2021-12-13 DIAGNOSIS — Z66 Do not resuscitate: Secondary | ICD-10-CM | POA: Diagnosis not present

## 2021-12-13 DIAGNOSIS — I513 Intracardiac thrombosis, not elsewhere classified: Secondary | ICD-10-CM | POA: Diagnosis not present

## 2021-12-13 DIAGNOSIS — Z8774 Personal history of (corrected) congenital malformations of heart and circulatory system: Secondary | ICD-10-CM | POA: Diagnosis not present

## 2021-12-13 DIAGNOSIS — Z951 Presence of aortocoronary bypass graft: Secondary | ICD-10-CM | POA: Diagnosis not present

## 2021-12-13 DIAGNOSIS — Z8674 Personal history of sudden cardiac arrest: Secondary | ICD-10-CM | POA: Diagnosis not present

## 2021-12-13 DIAGNOSIS — I472 Ventricular tachycardia, unspecified: Secondary | ICD-10-CM

## 2021-12-13 DIAGNOSIS — I2510 Atherosclerotic heart disease of native coronary artery without angina pectoris: Secondary | ICD-10-CM | POA: Diagnosis not present

## 2021-12-13 DIAGNOSIS — I5084 End stage heart failure: Secondary | ICD-10-CM | POA: Insufficient documentation

## 2021-12-13 DIAGNOSIS — Z95810 Presence of automatic (implantable) cardiac defibrillator: Secondary | ICD-10-CM | POA: Diagnosis not present

## 2021-12-13 DIAGNOSIS — Z7901 Long term (current) use of anticoagulants: Secondary | ICD-10-CM | POA: Diagnosis not present

## 2021-12-13 DIAGNOSIS — Z955 Presence of coronary angioplasty implant and graft: Secondary | ICD-10-CM | POA: Diagnosis not present

## 2021-12-13 DIAGNOSIS — Z87891 Personal history of nicotine dependence: Secondary | ICD-10-CM | POA: Insufficient documentation

## 2021-12-13 DIAGNOSIS — I5022 Chronic systolic (congestive) heart failure: Secondary | ICD-10-CM | POA: Insufficient documentation

## 2021-12-13 DIAGNOSIS — Z7902 Long term (current) use of antithrombotics/antiplatelets: Secondary | ICD-10-CM | POA: Diagnosis not present

## 2021-12-13 DIAGNOSIS — I5042 Chronic combined systolic (congestive) and diastolic (congestive) heart failure: Secondary | ICD-10-CM

## 2021-12-13 DIAGNOSIS — I130 Hypertensive heart and chronic kidney disease with heart failure and stage 1 through stage 4 chronic kidney disease, or unspecified chronic kidney disease: Secondary | ICD-10-CM | POA: Insufficient documentation

## 2021-12-13 DIAGNOSIS — Z86718 Personal history of other venous thrombosis and embolism: Secondary | ICD-10-CM | POA: Diagnosis not present

## 2021-12-13 DIAGNOSIS — I5082 Biventricular heart failure: Secondary | ICD-10-CM | POA: Insufficient documentation

## 2021-12-13 MED ORDER — LOKELMA 10 G PO PACK: 10.0000 g | PACK | ORAL | 0 refills | Status: DC

## 2021-12-13 NOTE — Telephone Encounter (Signed)
Abnormal labs received via fax   K 5.8 Per Janett Billow [place],NP -hold KCL -Lokelma 10 g x 1 dose -repeat bmet 6/2  Pt aware via daughter  Meds sent

## 2021-12-13 NOTE — Telephone Encounter (Signed)
Received labs drawn 12/12/21, K 5.8, SCr 2.04. Called and spoke to [place] wife, [name]. He will hold any KCL supplements and take Lokelma 10 g x 1 tonight. He will return tomorrow for repeat BMET @ 8:15. Rx called into [facility] on [address], [place] 12/13/21

## 2021-12-13 NOTE — Progress Notes (Signed)
ReDS Vest / Clip - 12/13/21 1100       ReDS Vest / Clip   Station Marker C    Ruler Value 34    ReDS Value Range Low volume    ReDS Actual Value 31    Anatomical Comments sitting

## 2021-12-13 NOTE — Patient Instructions (Signed)
Please be sure to take an extra 2.5 mg of metolazone as needed for home weights greater than 185 pounds. With every dose of metolazone be sure to take 40 meq of potassium  Your physician recommends that you schedule a follow-up appointment in: 4 weeks  in the Advanced Practitioners (PA/NP) Clinic and in 2-3 months with Dr Haroldine Laws   Do the following things EVERYDAY: Weigh yourself in the morning before breakfast. Write it down and keep it in a log. Take your medicines as prescribed Eat low salt foods--Limit salt (sodium) to 2000 mg per day.  Stay as active as you can everyday Limit all fluids for the day to less than 2 liters   At the [facility], you and your health needs are our priority. As part of our continuing mission to provide you with exceptional heart care, we have created designated Provider Care Teams. These Care Teams include your primary Cardiologist (physician) and Advanced Practice Providers (APPs- Physician Assistants and Nurse Practitioners) who all work together to provide you with the care you need, when you need it.   You may see any of the following providers on your designated Care Team at your next follow up: Dr Glori Bickers Dr Haynes Kerns, NP Lyda Jester, [place] [facility] [place], [place] Audry Riles, PharmD   Please be sure to bring in all your medications bottles to every appointment.   If you have any questions or concerns before your next appointment please send [place] a message through [place] or call our office at [phone].    TO LEAVE A MESSAGE FOR THE NURSE SELECT OPTION 2, PLEASE LEAVE A MESSAGE INCLUDING: YOUR NAME DATE OF BIRTH CALL BACK NUMBER REASON FOR CALL**this is important as we prioritize the call backs  YOU WILL RECEIVE A CALL BACK THE SAME DAY AS LONG AS YOU CALL BEFORE 4:00 PM

## 2021-12-14 ENCOUNTER — Other Ambulatory Visit (HOSPITAL_COMMUNITY): Payer: Self-pay

## 2021-12-14 ENCOUNTER — Other Ambulatory Visit (HOSPITAL_COMMUNITY): Payer: Medicare PPO

## 2021-12-14 NOTE — Telephone Encounter (Signed)
Pt's wife called to report they could not get Lokelma due to cost ($150)  Lokelma needs PA, however can not get PA for 1 time dose meds so unable to obtain. Per Juanita Craver, NP pt can come by office get STAT repeat bmet and Lokelma samples. Attempted to call pt/wife and Left message to call back

## 2021-12-14 NOTE — Telephone Encounter (Signed)
Left message to call back  

## 2021-12-20 ENCOUNTER — Other Ambulatory Visit (HOSPITAL_COMMUNITY): Payer: Self-pay | Admitting: Internal Medicine

## 2021-12-25 ENCOUNTER — Telehealth (HOSPITAL_COMMUNITY): Payer: Self-pay | Admitting: *Deleted

## 2021-12-25 ENCOUNTER — Encounter: Payer: Self-pay | Admitting: Cardiology

## 2021-12-25 DIAGNOSIS — I5020 Unspecified systolic (congestive) heart failure: Secondary | ICD-10-CM | POA: Diagnosis not present

## 2021-12-25 DIAGNOSIS — C784 Secondary malignant neoplasm of small intestine: Secondary | ICD-10-CM | POA: Diagnosis not present

## 2021-12-25 NOTE — Telephone Encounter (Signed)
Spoke directly with pt and caregiver they are aware and agreeable with plan.

## 2021-12-25 NOTE — Telephone Encounter (Signed)
Pts home health RN called to report pts weight is up 10lbs in  a week. No swelling but pt is more short of breath laying down. Today is patients Metolazone day. RN asked if we needed to make any med changes.  Routed to [employer]

## 2022-01-04 ENCOUNTER — Other Ambulatory Visit (HOSPITAL_COMMUNITY): Payer: Self-pay | Admitting: Internal Medicine

## 2022-01-08 DIAGNOSIS — I5020 Unspecified systolic (congestive) heart failure: Secondary | ICD-10-CM | POA: Diagnosis not present

## 2022-01-08 DIAGNOSIS — C784 Secondary malignant neoplasm of small intestine: Secondary | ICD-10-CM | POA: Diagnosis not present

## 2022-01-08 DIAGNOSIS — I5023 Acute on chronic systolic (congestive) heart failure: Secondary | ICD-10-CM | POA: Diagnosis not present

## 2022-01-09 ENCOUNTER — Telehealth (HOSPITAL_COMMUNITY): Payer: Self-pay

## 2022-01-09 ENCOUNTER — Telehealth (HOSPITAL_COMMUNITY): Payer: Self-pay | Admitting: Cardiology

## 2022-01-09 NOTE — Progress Notes (Incomplete)
ADVANCED HF CLINIC NOTE  Primary Care: Hamrick, Lorin Mercy, MD HF Cardiologist: Dr. Haroldine Laws  HPI: [name] is a 66 y.o. male with history of CAD s/p CABG in 2014 (LIMA to LAD, SVG to RCA, SVG to OM2) followed by inferior STEMI 09/20 s/p DES SVG to RCA anastomosis and STEMI 10/21 s/p POBA SVG to PDA, hx VT arrest (suspected scar mediated) s/p dual-chamber ICD, chronic systolic CHF/ICM, hx CVA, hx bilateral PE and b/l DVT, hx possible LV thrombus.   Admitted 11/21 with VT that converted to asystole. Required external pacing. Cardiac cath with stable CAD. EF 30-35%. Seen by AHF service at that time. Underwent dual chamber ICD during admit.    Admitted 09/22 with inferoposterior STEMI. Cath with thrombotic occlusion of SVG to OM1. SVG to RCA patent, unable to assess LIMA to LAD. He was treated with Aggrastat followed by aspirin, plavix and Eliquis. EF 20-25% on echo that admit.    Admitted 1/23 with acute bi-ventricular failure. Attempted to diurese with IV lasix but developed hypotension and AKI concerning for low-output. GDMT held. Echo showed EF 20-25%, moderate RV dysfunction. AHF consulted. Started on DBA for inotrope support and failed wean. Patient not interested in advanced therapies, home DBA arranged. PMT consulted and he was a DNR/DNI. Discharge weight 170 lbs.  He subsequently struggled with volume overload. Torsemide increased and metolazone added at outpatient f/u.   Saw EP 4/23, patient decided to disable tachycardia therapies on ICD.  He was last seen for f/u 06/01. Volume looked good. Remained on home dobutamine.   He is here today for follow-up.   Cardiac Studies: - Echo (11/21): EF 30-35%  - Echo (9/22): EF 20-25%   - Echo (1/23): EF 20-25%, moderate RV dysfunction, severe bi-atrial dilation, mild MR  Past Medical History:  Diagnosis Date   Acute pulmonary embolism without acute cor pulmonale (HCC)    PE-DVT during hospitalization Nov-Dec 2021 On Eliquis at  discharge   Acute ST elevation myocardial infarction (STEMI) involving other coronary artery of inferior wall (HCC) 03/31/2021   Acute ST elevation myocardial infarction (STEMI) of inferior wall ([place]) 03/31/2021   Borderline diabetic    Cardiogenic shock (HCC)    Coronary artery disease    a. s/p CABG in 2014 with LIMA-LAD, SVG-OM2 and SVG-RC b. 03/2019: STEMI and s/p angioplasty to proximal SVG-RCA and DES to SVG-RCA anastomosis c. 04/2020: recurrent STEMI with angioplasty of SVG-RCA   Dyslipidemia 01/11/2013   Hiatal hernia    Hypertension    Mini stroke    2   Respiratory failure with hypoxia (HCC)    S/P CABG x 3    Sleep disturbance    ST elevation myocardial infarction (STEMI) ([place]) 09/01/2012   Pt presented 03/23/2019 with inferior STEMI complicated by respiratory failure- intubation   STEMI (ST elevation myocardial infarction) ([place])    Stroke (HCC)    Transaminitis    Current Outpatient Medications  Medication Sig Dispense Refill   amiodarone (PACERONE) 200 MG tablet Take 1 tablet (200 mg total) by mouth 2 (two) times daily. 60 tablet 3   apixaban (ELIQUIS) 5 MG TABS tablet Take 1 tablet (5 mg total) by mouth 2 (two) times daily. 60 tablet 3   atorvastatin (LIPITOR) 80 MG tablet TAKE 1 TABLET BY MOUTH DAILY AT 6:00 PM 90 tablet 3   clopidogrel (PLAVIX) 75 MG tablet Take 1 tablet (75 mg total) by mouth daily. 90 tablet 3   DOBUTamine (DOBUTREX) 4-5 MG/ML-% infusion Inject 193.5 mcg/min  into the vein continuous. Per [facility] infusion 250 mL 12   escitalopram (LEXAPRO) 10 MG tablet Take 1 tablet (10 mg total) by mouth daily. 30 tablet 11   ezetimibe (ZETIA) 10 MG tablet Take 1 tablet (10 mg total) by mouth daily. 30 tablet 3   metolazone (ZAROXOLYN) 2.5 MG tablet Take 1 tablet (2.5 mg total) by mouth once a week. Every Tuesday, can take extra tab for weight 182 lb or greater 10 tablet 3   midodrine (PROAMATINE) 5 MG tablet Take 1 tablet (5 mg total) by mouth 3 (three) times daily with meals.  90 tablet 4   Multiple Vitamins-Minerals (ONE-A-DAY MENS 50+ ADVANTAGE) TABS Take 1 tablet by mouth daily with breakfast.     nitroGLYCERIN (NITROSTAT) 0.4 MG SL tablet Place 1 tablet (0.4 mg total) under the tongue every 5 (five) minutes x 3 doses as needed for chest pain. 25 tablet 2   nystatin (MYCOSTATIN/NYSTOP) powder APPLY TOPICALLY TO THE AFFECTED AREA 2 TIMES A DAY FOR 2 WEEKS 30 g 0   ondansetron (ZOFRAN) 4 MG tablet Take 1 tablet (4 mg total) by mouth every 8 (eight) hours as needed for nausea or vomiting. 30 tablet 0   potassium chloride SA (KLOR-CON M) 20 MEQ tablet TAKE 2 TABLETS (40 MEQ TOTAL) BY MOUTH when you take METOLAZONE 30 tablet 0   sodium zirconium cyclosilicate (LOKELMA) 10 g PACK packet Take 10 g by mouth as directed. 5 packet 0   Torsemide 40 MG TABS Take 80 mg by mouth 2 (two) times daily. 120 tablet 4   No current facility-administered medications for this visit.   No Known Allergies  Social History   Socioeconomic History   Marital status: Married    Spouse name: [name]   Number of children: 2   Years of education: Not on file   Highest education level: 11th grade  Occupational History   Occupation: retired    Comment: large Database administrator  Tobacco Use   Smoking status: Former    Types: Pipe   Smokeless tobacco: Never  Scientific laboratory technician Use: Never used  Substance and Sexual Activity   Alcohol use: No   Drug use: No   Sexual activity: Not Currently  Other Topics Concern   Not on file  Social History Narrative   Not on file   Social Determinants of Health   Financial Resource Strain: Medium Risk (08/10/2021)   Overall Financial Resource Strain (CARDIA)    Difficulty of Paying Living Expenses: Somewhat hard  Food Insecurity: No Food Insecurity (08/10/2021)   Hunger Vital Sign    Worried About Running Out of Food in the Last Year: Never true    Ran Out of Food in the Last Year: Never true  Transportation Needs: No Transportation Needs  (08/10/2021)   PRAPARE - Hydrologist (Medical): No    Lack of Transportation (Non-Medical): No  Physical Activity: Not on file  Stress: Not on file  Social Connections: Not on file  Intimate Partner Violence: Not on file   Family History  Problem Relation Age of Onset   Cancer Father    Hypertension Father    Hyperlipidemia Father    Heart attack Father    Hypertension Sister    Hypertension Brother    Hyperlipidemia Brother    Heart attack Sister    Heart attack Brother    There were no vitals taken for this visit.  Wt Readings from Last 3  Encounters:  12/13/21 84.3 kg (185 lb 12.8 oz)  11/07/21 81.1 kg (178 lb 12.8 oz)  10/30/21 84.1 kg (185 lb 6.4 oz)   PHYSICAL EXAM: General:  NAD. No resp difficulty, walked into clinic, chronically-ill appearing HEENT: Normal Neck: Supple. JVP 6-7. Carotids 2+ bilat; no bruits. No lymphadenopathy or thryomegaly appreciated. Cor: PMI nondisplaced. Regular rate & rhythm. No rubs, gallops or murmurs. Lungs: Clear Abdomen: Soft, nontender, nondistended. No hepatosplenomegaly. No bruits or masses. Good bowel sounds. Extremities: No cyanosis, clubbing, rash, edema; PICC site ok Neuro: Alert & oriented x 3, cranial nerves grossly intact. Moves all 4 extremities w/o difficulty. Affect pleasant.  REDs: 31%  ASSESSMENT & PLAN: 1. End-stage chronic systolic CHF with biventricular failure/ICM: - Echo 05/04/20 EF 25%. Moderate RV to severe RV dysfunction and possible LV thrombus.  - [place] 10/21 with preserved output and well compensated filling pressures.  - Echo 11/21 EF 25-30%  - Echo 01/23: EF 20-25% Moderate RV dysfunction.  - Started on DBA 2.5 during 1/23 admission, failed wean and continued palliative home inotrope.  - Improved NYHA III, functional status difficult due to general deconditioning. Volume looks ***, ReDs ***. Discussed taking extra metolazone + 40 KCL if weight > 182 lbs on home scale.  - Continue  midodrine 5 mg tid. - Continue torsemide 80 mg bid + metolazone 2.5 mg + 40 KCL every Tuesday.  - Off Entresto and spiro with AKI/hypotension. - SGLT2i stopped with renal impairment - Off beta blocker with low-output HF - He is not interested in advanced therapies (VAD/transplant).   2. CAD: - s/p CABG 2014 - Inferior STEMI 09/20 s/p DES SVG to RCA anastomosis - Inferior STEMI 05/03/20 s/p POBA SVG->PDA - Inferoposterior STEMI 09/22 >> thrombotic occlusion SVG to OM1 - No s/s angina. - On plavix. No ASA d/t anticoagulation.  - Continue atorvastatin 80.   3. Hx VT arrest/frequent PVCs: - S/p dual chamber ICD placement. Therapy now disabled. - Continue amio.   4. Hx Bilateral PE/b/l DVT: - Anticoagulated with Eliquis. - No bleeding.   5. Possible LV thrombus: - Noted on echo 10/21, TEE 11/21 with no thrombus.   6. CKD IV: - Baseline Scr previously 1.4, 3.12 on discharge in 1/23 - New baseline had been ~2.6-2.7 but slowly improved. Last Scr 1.95 on 06/13. - On DBA 2.5   7. GOC - DNR/DNI. ICD de-activated. - Not interested in Hospice.    Follow up ***  Elmor Kost N, PA-C  4:21 PM 01/09/22

## 2022-01-09 NOTE — Telephone Encounter (Signed)
Abnormal labs received from [place] drawn 01/08/22 K 5.5 Cr 1.88 BUN 88  Per [facility]  Repeat labs 01/10/22   [facility] for patient

## 2022-01-09 NOTE — Telephone Encounter (Signed)
Called and left patient a detailed message to confirm/remind patient of their appointment at the [facility] on 01/10/22.

## 2022-01-10 ENCOUNTER — Ambulatory Visit (HOSPITAL_COMMUNITY)
Admission: RE | Admit: 2022-01-10 | Discharge: 2022-01-10 | Disposition: A | Discharge status: Home / Self Care | Payer: Medicare PPO | Source: Ambulatory Visit | Attending: Physician Assistant | Admitting: Physician Assistant

## 2022-01-10 ENCOUNTER — Encounter (HOSPITAL_COMMUNITY): Payer: Self-pay

## 2022-01-10 VITALS — BP 108/78 | HR 72 | Wt 205.8 lb

## 2022-01-10 DIAGNOSIS — I252 Old myocardial infarction: Secondary | ICD-10-CM | POA: Insufficient documentation

## 2022-01-10 DIAGNOSIS — Z7902 Long term (current) use of antithrombotics/antiplatelets: Secondary | ICD-10-CM | POA: Diagnosis not present

## 2022-01-10 DIAGNOSIS — Z66 Do not resuscitate: Secondary | ICD-10-CM | POA: Diagnosis not present

## 2022-01-10 DIAGNOSIS — Z86718 Personal history of other venous thrombosis and embolism: Secondary | ICD-10-CM | POA: Insufficient documentation

## 2022-01-10 DIAGNOSIS — I5082 Biventricular heart failure: Secondary | ICD-10-CM | POA: Insufficient documentation

## 2022-01-10 DIAGNOSIS — Z95810 Presence of automatic (implantable) cardiac defibrillator: Secondary | ICD-10-CM | POA: Insufficient documentation

## 2022-01-10 DIAGNOSIS — Z955 Presence of coronary angioplasty implant and graft: Secondary | ICD-10-CM | POA: Insufficient documentation

## 2022-01-10 DIAGNOSIS — N184 Chronic kidney disease, stage 4 (severe): Secondary | ICD-10-CM | POA: Diagnosis not present

## 2022-01-10 DIAGNOSIS — Z8774 Personal history of (corrected) congenital malformations of heart and circulatory system: Secondary | ICD-10-CM | POA: Insufficient documentation

## 2022-01-10 DIAGNOSIS — I5022 Chronic systolic (congestive) heart failure: Secondary | ICD-10-CM | POA: Insufficient documentation

## 2022-01-10 DIAGNOSIS — E1122 Type 2 diabetes mellitus with diabetic chronic kidney disease: Secondary | ICD-10-CM | POA: Insufficient documentation

## 2022-01-10 DIAGNOSIS — Z7901 Long term (current) use of anticoagulants: Secondary | ICD-10-CM | POA: Diagnosis not present

## 2022-01-10 DIAGNOSIS — I2510 Atherosclerotic heart disease of native coronary artery without angina pectoris: Secondary | ICD-10-CM | POA: Insufficient documentation

## 2022-01-10 DIAGNOSIS — Z8673 Personal history of transient ischemic attack (TIA), and cerebral infarction without residual deficits: Secondary | ICD-10-CM | POA: Diagnosis not present

## 2022-01-10 DIAGNOSIS — I130 Hypertensive heart and chronic kidney disease with heart failure and stage 1 through stage 4 chronic kidney disease, or unspecified chronic kidney disease: Secondary | ICD-10-CM | POA: Insufficient documentation

## 2022-01-10 DIAGNOSIS — Z79899 Other long term (current) drug therapy: Secondary | ICD-10-CM | POA: Diagnosis not present

## 2022-01-10 DIAGNOSIS — I255 Ischemic cardiomyopathy: Secondary | ICD-10-CM

## 2022-01-10 DIAGNOSIS — R140 Abdominal distension (gaseous): Secondary | ICD-10-CM | POA: Diagnosis not present

## 2022-01-10 DIAGNOSIS — Z86711 Personal history of pulmonary embolism: Secondary | ICD-10-CM | POA: Insufficient documentation

## 2022-01-10 DIAGNOSIS — I5084 End stage heart failure: Secondary | ICD-10-CM | POA: Insufficient documentation

## 2022-01-10 DIAGNOSIS — R112 Nausea with vomiting, unspecified: Secondary | ICD-10-CM | POA: Insufficient documentation

## 2022-01-10 DIAGNOSIS — Z8674 Personal history of sudden cardiac arrest: Secondary | ICD-10-CM | POA: Insufficient documentation

## 2022-01-10 DIAGNOSIS — R600 Localized edema: Secondary | ICD-10-CM | POA: Diagnosis not present

## 2022-01-10 DIAGNOSIS — Z951 Presence of aortocoronary bypass graft: Secondary | ICD-10-CM | POA: Insufficient documentation

## 2022-01-10 LAB — CBC
HCT: 33 % — ABNORMAL LOW (ref 39.0–52.0)
Hemoglobin: 9.4 g/dL — ABNORMAL LOW (ref 13.0–17.0)
MCH: 21.7 pg — ABNORMAL LOW (ref 26.0–34.0)
MCHC: 28.5 g/dL — ABNORMAL LOW (ref 30.0–36.0)
MCV: 76 fL — ABNORMAL LOW (ref 80.0–100.0)
Platelets: 327 10*3/uL (ref 150–400)
RBC: 4.34 MIL/uL (ref 4.22–5.81)
RDW: 21.2 % — ABNORMAL HIGH (ref 11.5–15.5)
WBC: 10.5 10*3/uL (ref 4.0–10.5)
nRBC: 1.4 % — ABNORMAL HIGH (ref 0.0–0.2)

## 2022-01-10 LAB — COMPREHENSIVE METABOLIC PANEL
ALT: 159 U/L — ABNORMAL HIGH (ref 0–44)
AST: 118 U/L — ABNORMAL HIGH (ref 15–41)
Albumin: 3.1 g/dL — ABNORMAL LOW (ref 3.5–5.0)
Alkaline Phosphatase: 171 U/L — ABNORMAL HIGH (ref 38–126)
Anion gap: 12 (ref 5–15)
BUN: 53 mg/dL — ABNORMAL HIGH (ref 8–23)
CO2: 23 mmol/L (ref 22–32)
Calcium: 8.7 mg/dL — ABNORMAL LOW (ref 8.9–10.3)
Chloride: 94 mmol/L — ABNORMAL LOW (ref 98–111)
Creatinine, Ser: 2.29 mg/dL — ABNORMAL HIGH (ref 0.61–1.24)
GFR, Estimated: 31 mL/min — ABNORMAL LOW (ref >60)
Glucose, Bld: 98 mg/dL (ref 70–99)
Potassium: 5.3 mmol/L — ABNORMAL HIGH (ref 3.5–5.1)
Sodium: 129 mmol/L — ABNORMAL LOW (ref 135–145)
Total Bilirubin: 3.2 mg/dL — ABNORMAL HIGH (ref 0.3–1.2)
Total Protein: 6.5 g/dL (ref 6.5–8.1)

## 2022-01-10 LAB — BRAIN NATRIURETIC PEPTIDE: B Natriuretic Peptide: 1191.5 pg/mL — ABNORMAL HIGH (ref 0.0–100.0)

## 2022-01-10 MED ORDER — DOBUTAMINE IN D5W 4-5 MG/ML-% IV SOLN: 5.0000 ug/kg/min | INTRAVENOUS | Status: AC

## 2022-01-10 NOTE — Patient Instructions (Addendum)
Labs done today. We will contact you only if your labs are abnormal.  Take Furoscix 74m/10mL(1 kit) 2 times daily for 3 days.  DO NOT TAKE TORSEMIDE WHILE TAKING FUROSCIX.   Your provider has order Furoscix for you. This is an on-body infuser that gives you a dose of Furosemide.   It will be shipped to your home   [facility] [facility] will call you to discuss before shipping so, PLEASE answer unknown calls  For questions regarding the device call [employer] [facility] at [phone] Ensure you write down the time you start your infusion so that if there is a problem you will know how long the infusion lasted  Use Furoscix only AS DIRECTED by our office  TAKE METOLAZONE 2.5MG (1 TABLET) BY MOUTH TODAY ONLY.  DO NOT TAKE POTASSIUM UNLESS WE SAY OTHERWISE.   INCREASE Dobutamine to 560mkg/min. This was sent into the pharmacy.   No other medication changes were made. Please continue all current medications as prescribed.  You have been referred to Hospice. They will contact you to set up a home visit.   Your physician recommends that you schedule a follow-up appointment in: 1 week  If you have any questions or concerns before your next appointment please send [place] message through my[place] call our office at [phone]   TO LEAVE A MESSAGE FOR THE NURSE SELECT OPTION 2, PLEASE LEAVE A MESSAGE INCLUDING: YOUR NAME DATE OF BIRTH CALL BACK NUMBER REASON FOR CALL**this is important as we prioritize the call backs  YOU WILL RECEIVE A CALL BACK THE SAME DAY AS LONG AS YOU CALL BEFORE 4:00 PM   Do the following things EVERYDAY: Weigh yourself in the morning before breakfast. Write it down and keep it in a log. Take your medicines as prescribed Eat low salt foods--Limit salt (sodium) to 2000 mg per day.  Stay as active as you can everyday Limit all fluids for the day to less than 2 liters   At the [facility] and your health needs are our priority. As part of  our continuing mission to provide you with exceptional heart care, we have created designated Provider Care Teams. These Care Teams include your primary Cardiologist (physician) and Advanced Practice Providers (APPs- Physician Assistants and Nurse Practitioners) who all work together to provide you with the care you need, when you need it.   You may see any of the following providers on your designated Care Team at your next follow up: Dr DaGlori Bickersr DaHaynes KernsNP BrLyda JesterPAUtahaAudry RilesPharmD   Please be sure to bring in all your medications bottles to every appointment.

## 2022-01-10 NOTE — Progress Notes (Signed)
Provided patient education on Furoscix using demo kits and Furoscix video, QR code provided on AVS for further viewing. Furoscix order form completed and signed by Ellwood Dense & Railey Glad,CMA. Order form, ins info, & OV note all faxed into [facility] [facility].

## 2022-01-10 NOTE — Progress Notes (Signed)
Medication Samples have been provided to the patient.  Drug name: Furoscix       Strength: 80mg /1mL        Qty: 2  LOT: [id number]  Exp.Date: 09/11/2022  Dosing instructions: use only as directed by the HF Clinic.   The patient has been instructed regarding the correct time, dose, and frequency of taking this medication, including desired effects and most common side effects.   Kahmari Herard R Lejuan Botto 82:64 AM 01/10/2022

## 2022-01-11 ENCOUNTER — Telehealth (HOSPITAL_COMMUNITY): Payer: Self-pay | Admitting: Licensed Clinical Social Worker

## 2022-01-11 NOTE — Progress Notes (Signed)
Heart and Vascular Care Navigation  01/11/2022  Michel Hendon July 22, 1955 [id number]  Reason for Referral: home concerns   Engaged with patient by telephone for initial visit for Heart and Vascular Care Coordination.                                                                                                   Assessment:  CSW informed that pt is at home on hospice with limited time left to live and [facility] has broken and pt is laying on floor with fan to stay cool.  CSW spoke with wife who confirms they have no working [facility] unit and home becoming uncomfortable.  Pt does not want to go to facility to pass so CSW discussed [place] providing [facility] unit through [employer] program.  Wife is agreeable to [place] shipping out through [employer].                                    HRT/VAS Care Coordination     Living arrangements for the past 2 months Single Family Home   Lives with: [place] Devices/Equipment Walker (specify type); Shower chair with back   Current home services DME  cane       Social History:                                                                             SDOH Screenings   Alcohol Screen: Low Risk  (08/10/2021)   Alcohol Screen    Last Alcohol Screening Score (AUDIT): 0  Depression (PHQ2-9): Medium Risk (07/27/2020)   Depression (PHQ2-9)    PHQ-2 Score: 5  Financial Resource Strain: High Risk (01/11/2022)   Overall Financial Resource Strain (CARDIA)    Difficulty of Paying Living Expenses: Hard  Food Insecurity: No Food Insecurity (08/10/2021)   Hunger Vital Sign    Worried About Running Out of Food in the Last Year: Never true    Ran Out of Food in the Last Year: Never true  Housing: Low Risk  (08/10/2021)   Housing    Last Housing Risk Score: 0  Physical Activity: Not on file  Social Connections: Not on file  Stress: Not on file  Tobacco Use: Medium Risk (01/10/2022)   Patient History    Smoking Tobacco Use: Former    Smokeless Tobacco Use:  Never    Passive Exposure: Not on file  Transportation Needs: No Transportation Needs (08/10/2021)   PRAPARE - Hydrologist (Medical): No    Lack of Transportation (Non-Medical): No     Follow-up plan:    No further needs at this time  Jorge Ny, LCSW Clinical Social Worker [facility] Desk#:  [phone] Cell#: [phone]

## 2022-01-17 ENCOUNTER — Encounter (HOSPITAL_COMMUNITY): Payer: Medicare PPO

## 2022-01-23 ENCOUNTER — Ambulatory Visit: Payer: Medicare PPO | Admitting: Cardiovascular Disease

## 2022-03-28 ENCOUNTER — Encounter (HOSPITAL_COMMUNITY): Payer: Medicare PPO | Admitting: Internal Medicine

## 2022-05-19 IMAGING — DX DG CHEST 1V PORT
1 series · 1 of 1 positions shown · non-contrast
Comparison: June 06, 2020

CLINICAL DATA: Intubation.

EXAM:
PORTABLE CHEST 1 VIEW

[chest ap]
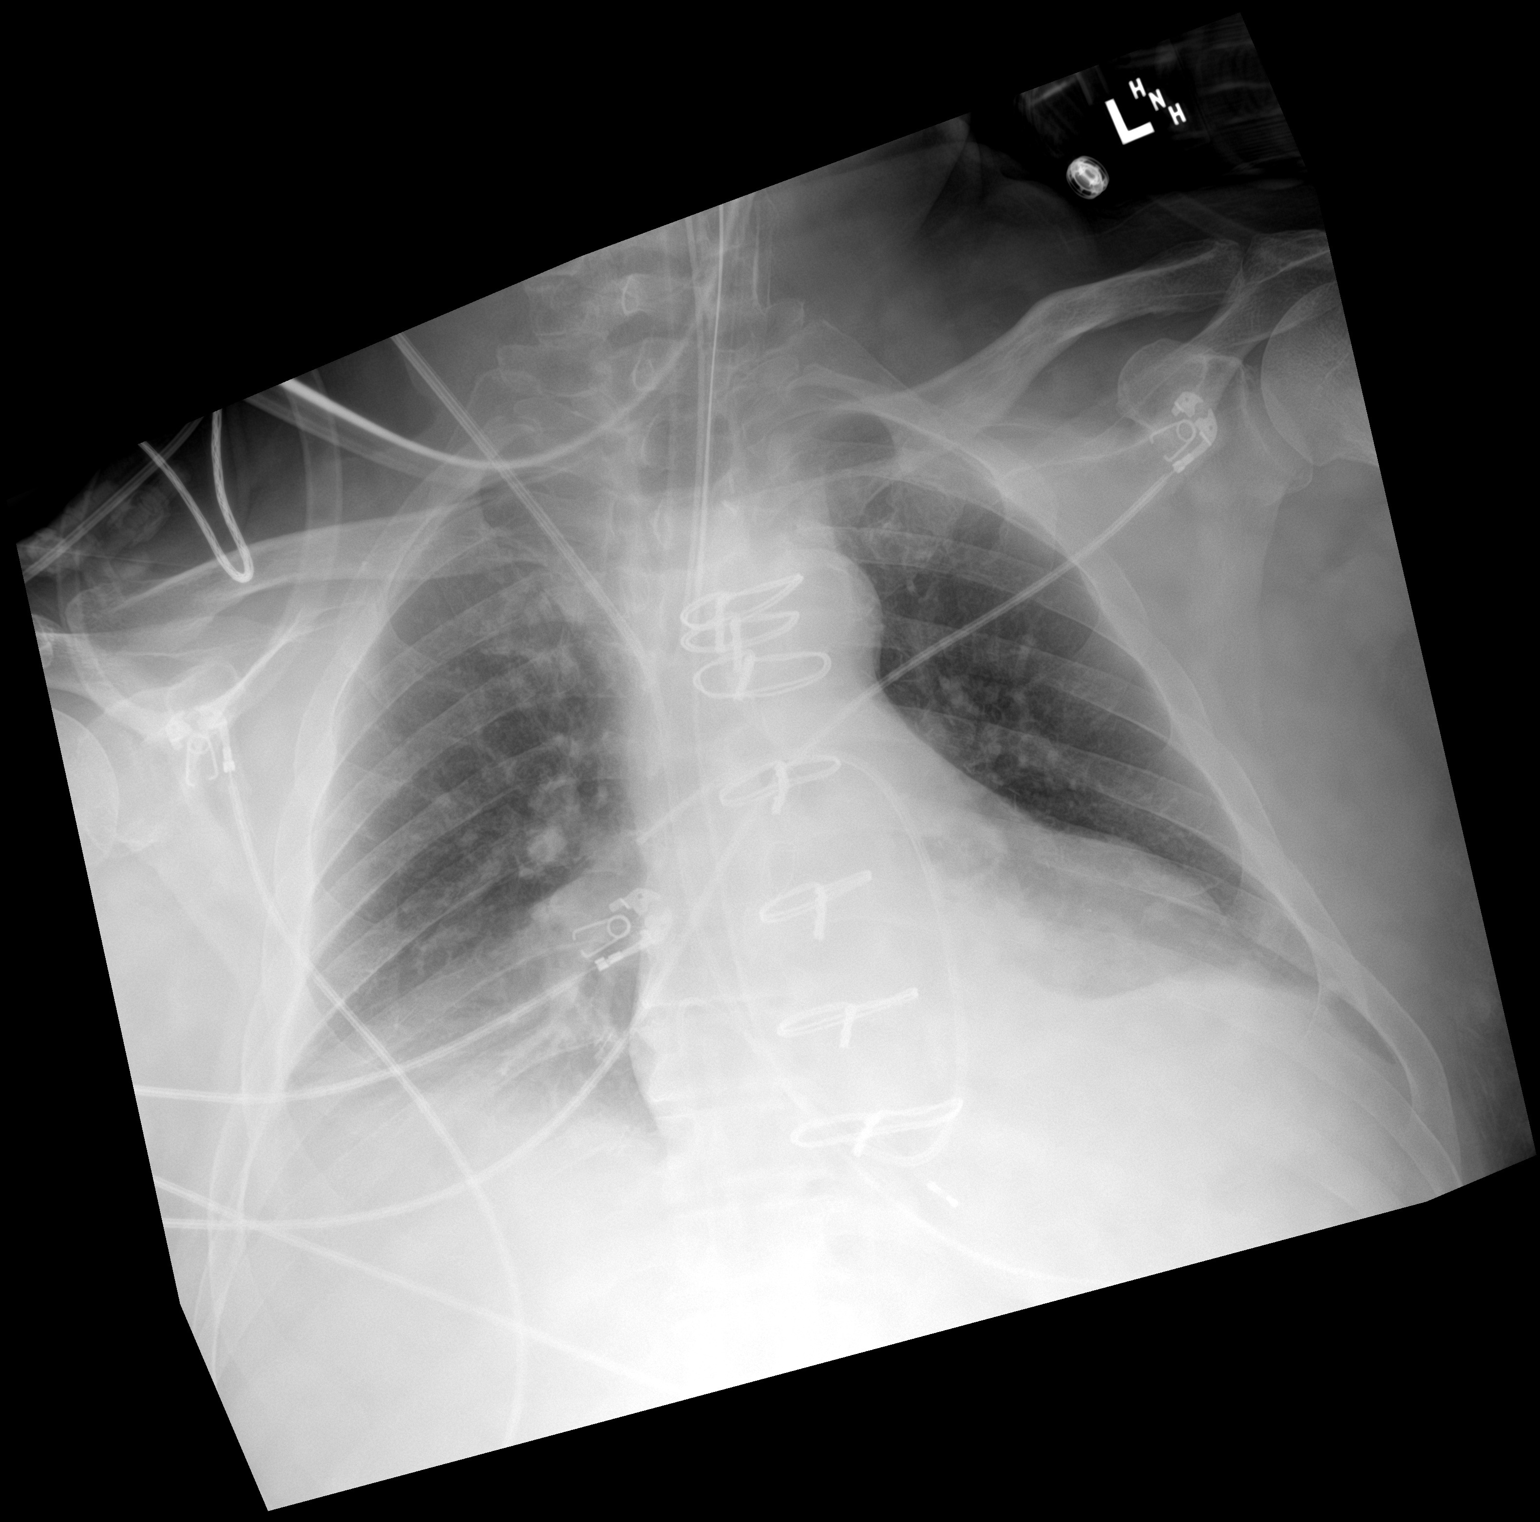

[1 of 1 positions shown; findings below may reference images not displayed]

FINDINGS: The endotracheal tube terminates above the carina by approximately 3
cm. The Swan-Ganz catheter tip projects over the main right
pulmonary artery. The enteric tube appears to extend below the left
hemidiaphragm. There is a femoral approach line projecting over the
right ventricle which may represent a pacing lead. There are
bilateral pleural effusions which have increased from prior study.
The heart size is stable. Aortic calcifications are noted. There is
no pneumothorax.
IMPRESSION: 1. Lines and tubes as above.
2. Worsening bilateral pleural effusions.
3. No pneumothorax. The cardiac silhouette is stable in size from
prior study.

## 2022-05-19 IMAGING — DX DG CHEST 1V PORT
1 series · 1 of 1 positions shown · non-contrast
Comparison: 06/04/2020.

CLINICAL DATA: Intubation.  Respiratory failure.

EXAM:
PORTABLE CHEST 1 VIEW

[chest ap]
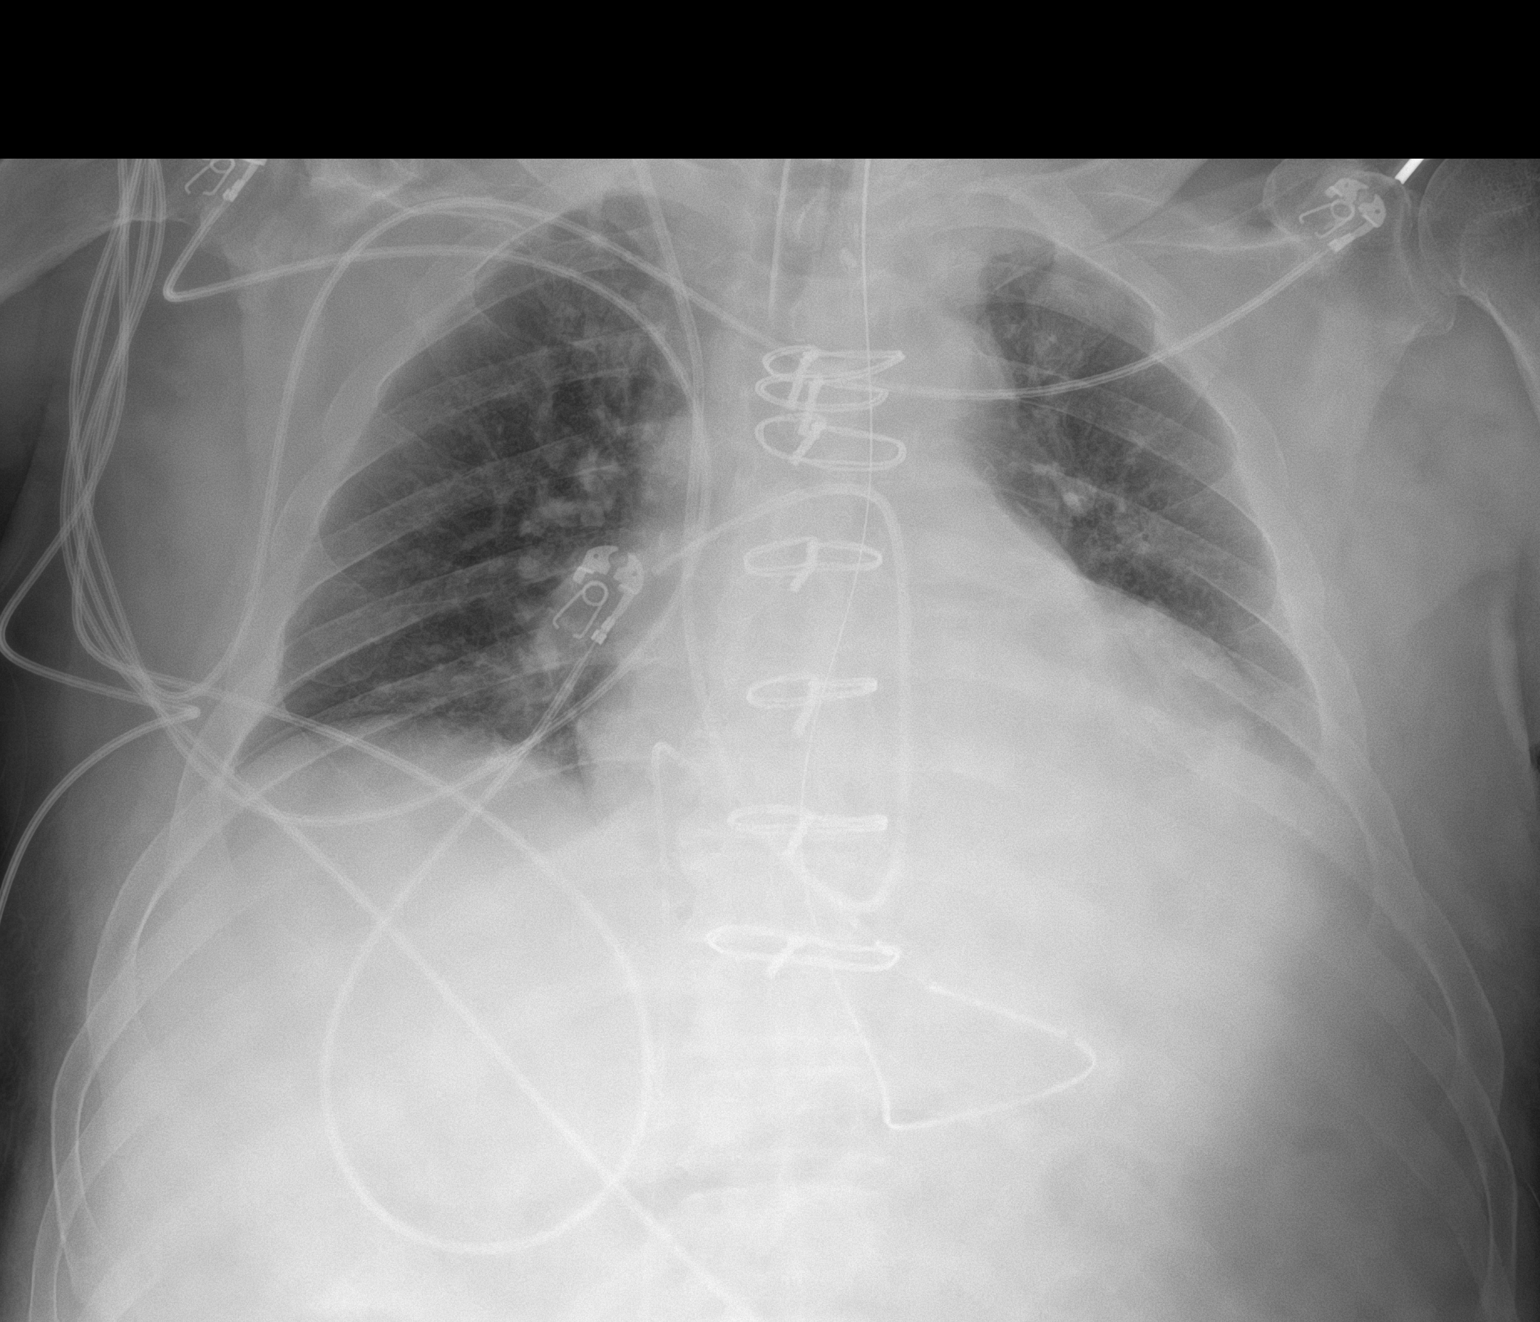

[1 of 1 positions shown; findings below may reference images not displayed]

FINDINGS: Interim placement of Swan-Ganz catheter, its tip is over the right
main pulmonary artery. Endotracheal tube and NG tube in stable
position. Prior median sternotomy. Stable cardiomegaly. No pulmonary
venous congestion. Mild bibasilar atelectasis/infiltrates. No
pleural effusion or pneumothorax.
IMPRESSION: 1. Interim placement of Swan-Ganz catheter, its tip is over the
right main pulmonary artery. Endotracheal tube and NG tube in stable
position.
2. Prior median sternotomy. Stable cardiomegaly.

## 2022-05-20 IMAGING — DX DG CHEST 1V PORT
1 series · 1 of 1 positions shown · non-contrast
Comparison: 06/06/2020

CLINICAL DATA: Respiratory failure

EXAM:
PORTABLE CHEST 1 VIEW

[chest ap]
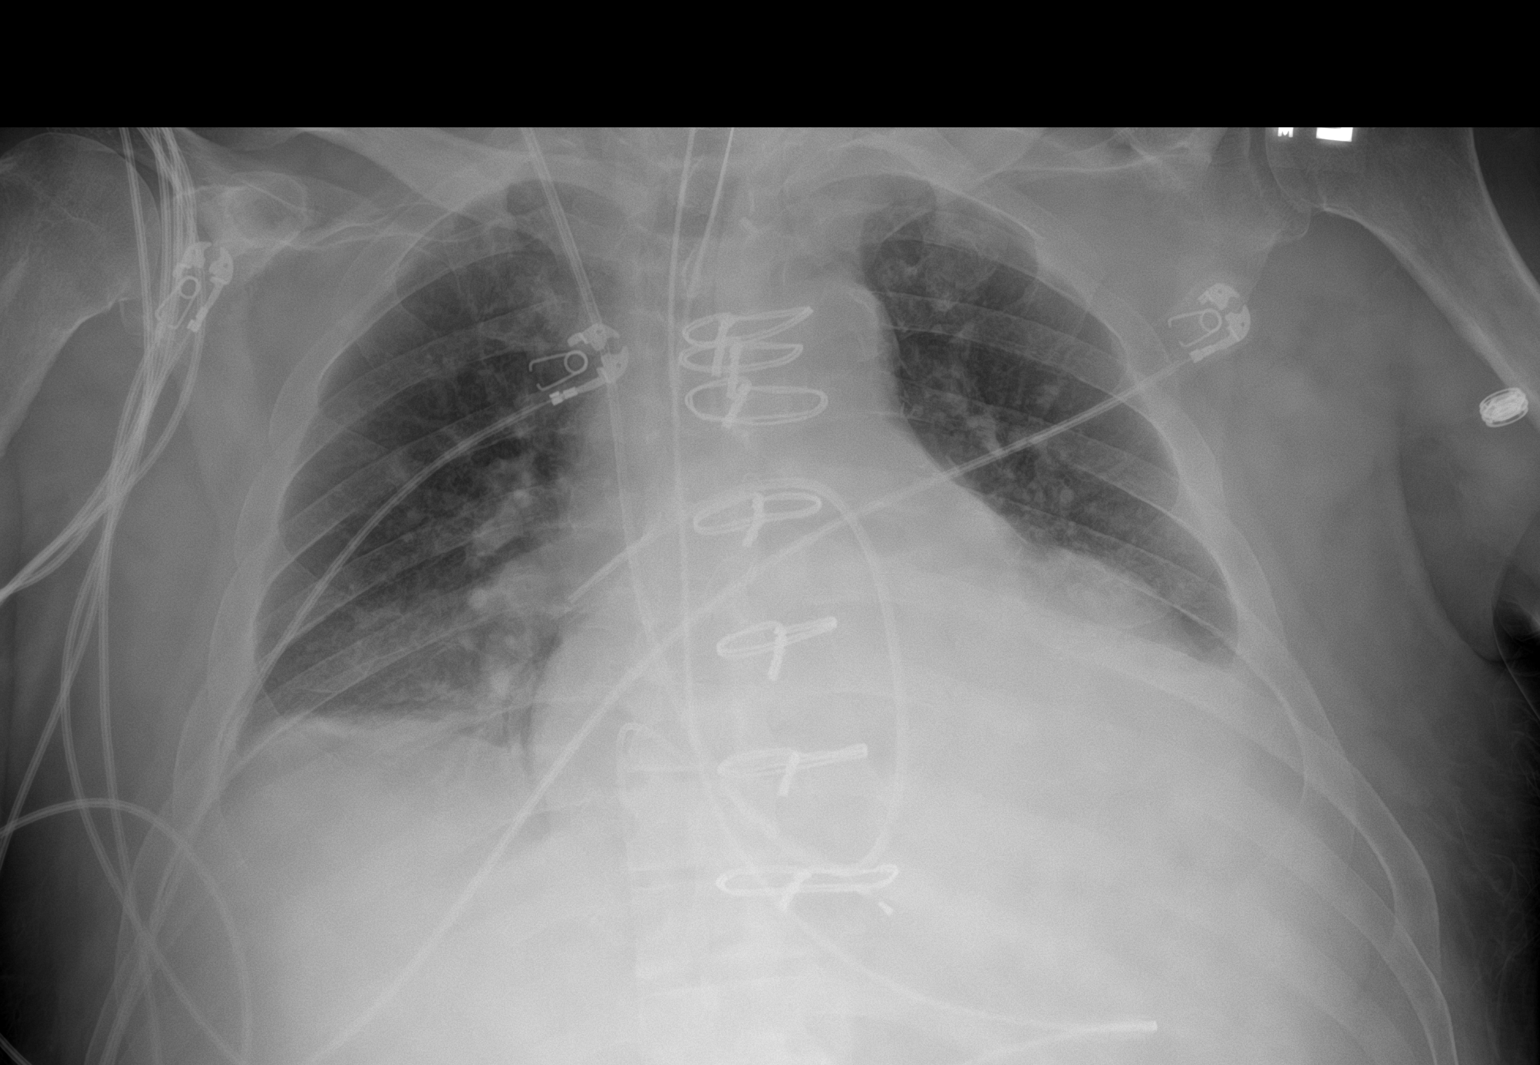

[1 of 1 positions shown; findings below may reference images not displayed]

FINDINGS: Cardiac shadow is enlarged but stable. Swan-Ganz catheter,
endotracheal tube and gastric catheter are noted in satisfactory
position. The overall inspiratory effort is poor with slight
increase in left-sided pleural effusion. Mild bibasilar atelectasis
is noted.
IMPRESSION: Slight increase in left-sided pleural effusion.

Mild bibasilar atelectasis.

Tubes and lines as described above.

## 2023-03-13 IMAGING — DX DG CHEST 1V PORT
1 series · 1 of 1 positions shown · non-contrast
Comparison: June 24, 2020

CLINICAL DATA: Acute ST-elevation

EXAM:
PORTABLE CHEST 1 VIEW

[chest ap]
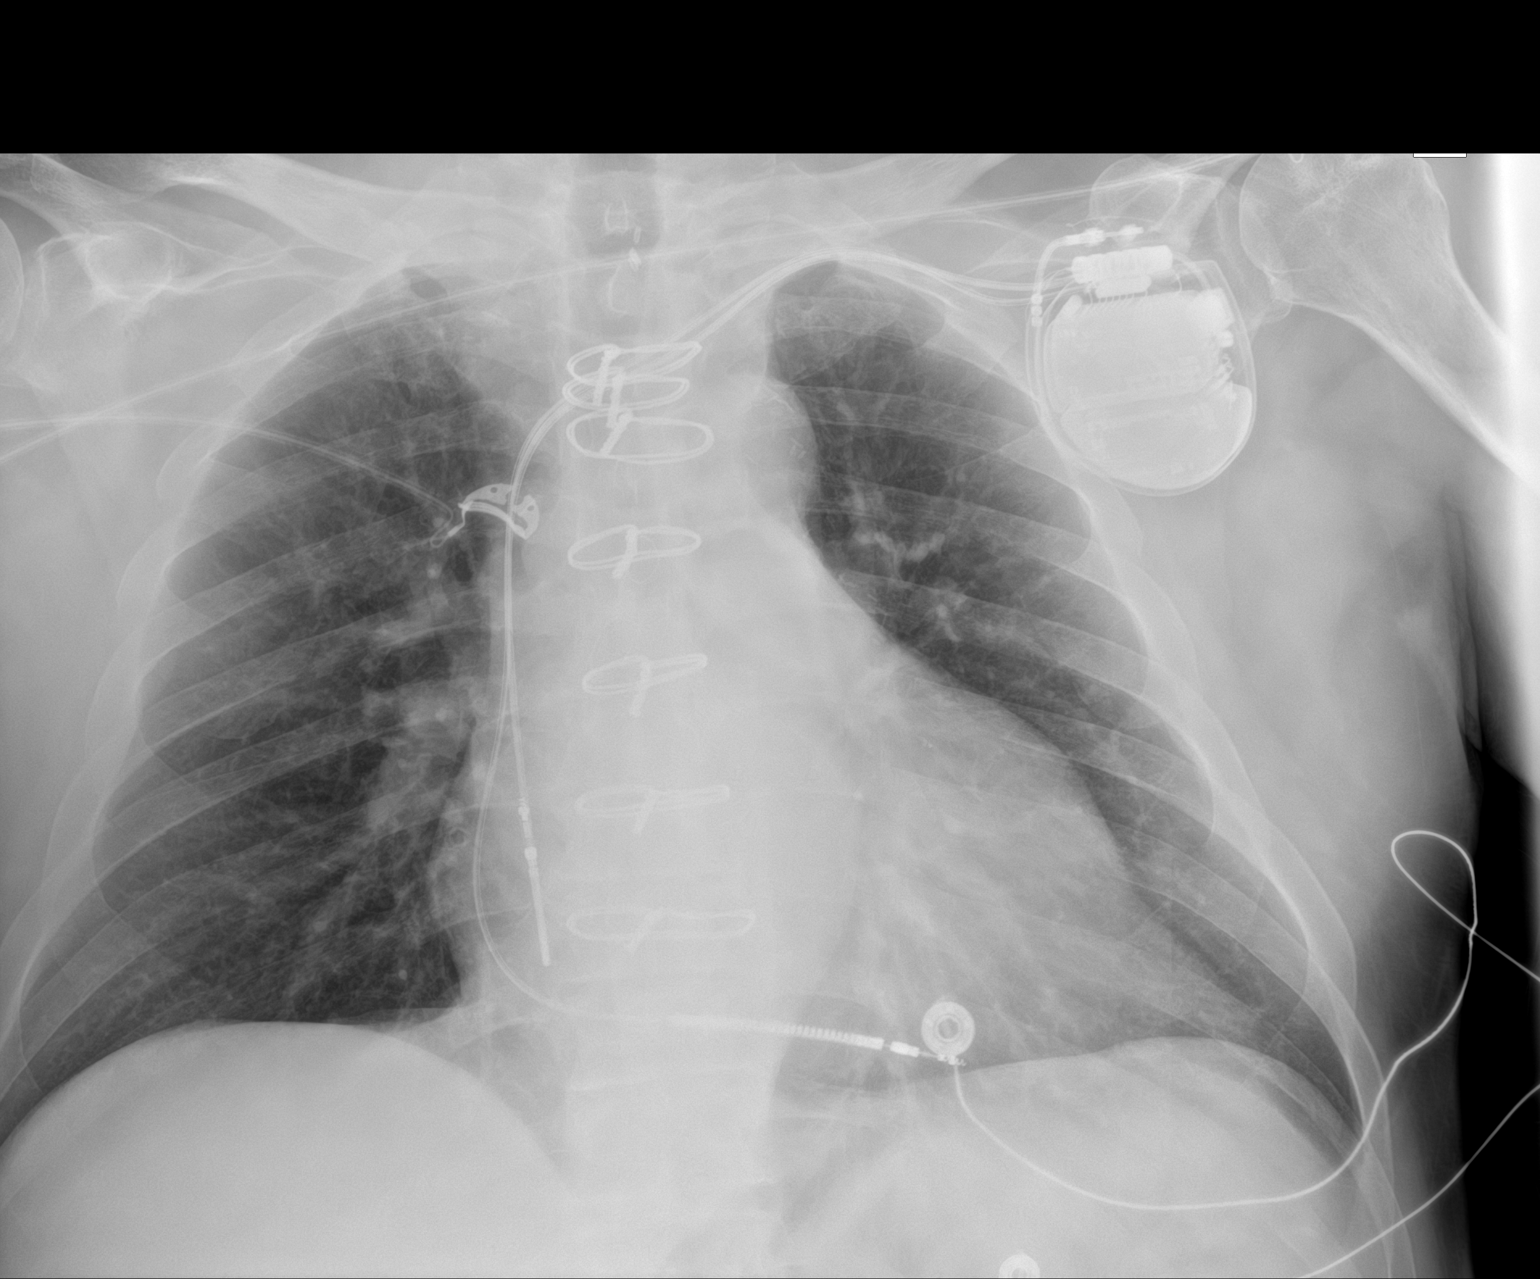

[1 of 1 positions shown; findings below may reference images not displayed]

FINDINGS: Stable pacemaker. Stable cardiomegaly. The hila, mediastinum, lungs,
and pleura are normal.
IMPRESSION: No active disease.

## 2023-03-15 IMAGING — DX DG CHEST 1V PORT
1 series · 1 of 1 positions shown · non-contrast
Comparison: 03/31/2021

CLINICAL DATA: Fevers

EXAM:
PORTABLE CHEST 1 VIEW

[chest ap]
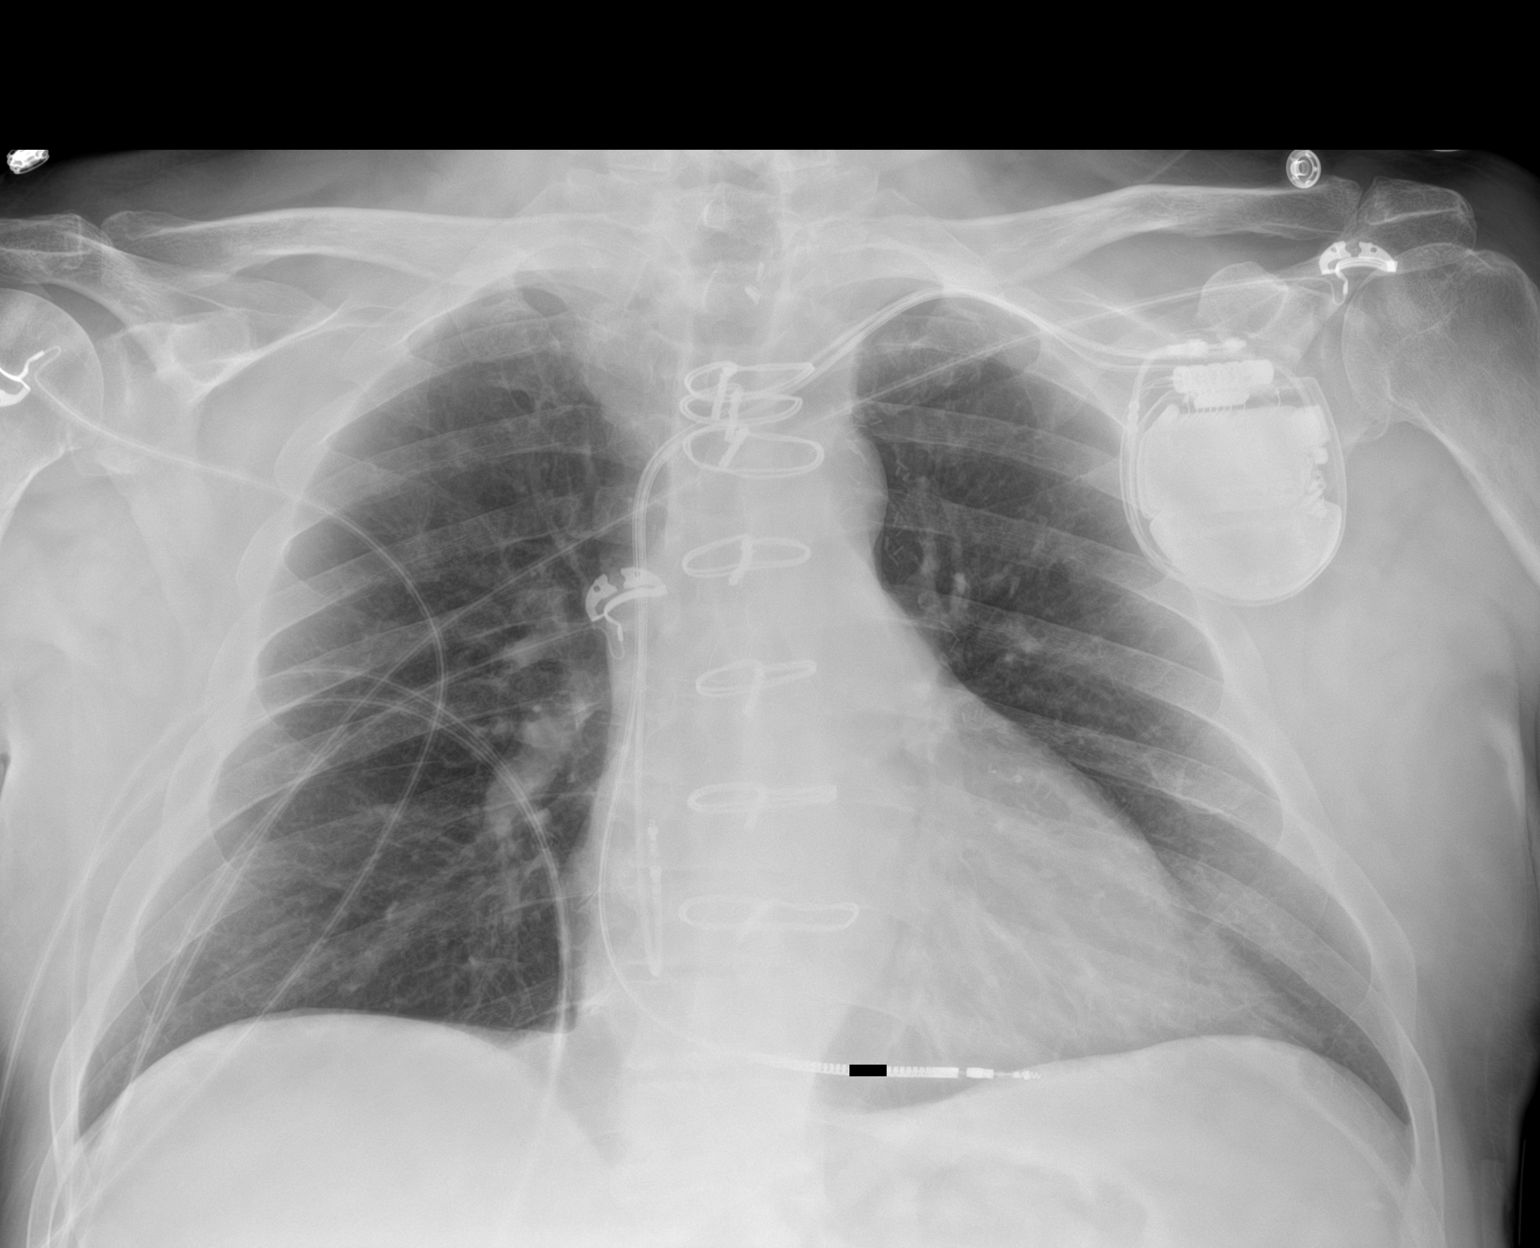

[1 of 1 positions shown; findings below may reference images not displayed]

FINDINGS: Cardiac shadow is mildly prominent but accentuated by the portable
technique. Defibrillator is again noted as well as postsurgical
changes. The lungs are clear. No bony abnormality is noted.
IMPRESSION: No active disease.
